# Patient Record
Sex: Male | Born: 1935 | Race: White | Hispanic: No | Marital: Married | Smoking: Never smoker
Health system: Southern US, Community
[De-identification: ages and names within clinical notes are randomized; demographics above are authoritative.]

## PROBLEM LIST (undated history)

## (undated) DIAGNOSIS — E785 Hyperlipidemia, unspecified: Secondary | ICD-10-CM

## (undated) DIAGNOSIS — I441 Atrioventricular block, second degree: Secondary | ICD-10-CM

## (undated) DIAGNOSIS — C439 Malignant melanoma of skin, unspecified: Secondary | ICD-10-CM

## (undated) DIAGNOSIS — I639 Cerebral infarction, unspecified: Secondary | ICD-10-CM

## (undated) DIAGNOSIS — I4891 Unspecified atrial fibrillation: Secondary | ICD-10-CM

## (undated) DIAGNOSIS — IMO0002 Reserved for concepts with insufficient information to code with codable children: Secondary | ICD-10-CM

## (undated) DIAGNOSIS — I509 Heart failure, unspecified: Secondary | ICD-10-CM

## (undated) DIAGNOSIS — I10 Essential (primary) hypertension: Secondary | ICD-10-CM

## (undated) DIAGNOSIS — N186 End stage renal disease: Secondary | ICD-10-CM

## (undated) DIAGNOSIS — E119 Type 2 diabetes mellitus without complications: Secondary | ICD-10-CM

## (undated) DIAGNOSIS — D649 Anemia, unspecified: Secondary | ICD-10-CM

## (undated) DIAGNOSIS — Z992 Dependence on renal dialysis: Secondary | ICD-10-CM

## (undated) HISTORY — PX: COLONOSCOPY: SHX174

## (undated) HISTORY — DX: Reserved for concepts with insufficient information to code with codable children: IMO0002

## (undated) HISTORY — DX: Cerebral infarction, unspecified: I63.9

## (undated) HISTORY — DX: Essential (primary) hypertension: I10

## (undated) HISTORY — DX: Malignant melanoma of skin, unspecified: C43.9

## (undated) HISTORY — DX: Unspecified atrial fibrillation: I48.91

## (undated) HISTORY — PX: CATARACT EXTRACTION W/ INTRAOCULAR LENS  IMPLANT, BILATERAL: SHX1307

## (undated) HISTORY — DX: Type 2 diabetes mellitus without complications: E11.9

## (undated) HISTORY — PX: LAMINECTOMY: SHX219

## (undated) HISTORY — DX: Heart failure, unspecified: I50.9

## (undated) HISTORY — DX: Atrioventricular block, second degree: I44.1

## (undated) HISTORY — DX: Hyperlipidemia, unspecified: E78.5

---

## 1998-04-06 ENCOUNTER — Other Ambulatory Visit: Admission: RE | Admit: 1998-04-06 | Discharge: 1998-04-06 | Payer: Self-pay | Admitting: Urology

## 1998-11-07 ENCOUNTER — Other Ambulatory Visit: Admission: RE | Admit: 1998-11-07 | Discharge: 1998-11-07 | Payer: Self-pay | Admitting: Family Medicine

## 1998-12-19 ENCOUNTER — Ambulatory Visit (HOSPITAL_COMMUNITY): Admission: RE | Admit: 1998-12-19 | Discharge: 1998-12-19 | Payer: Self-pay | Admitting: Gastroenterology

## 1999-01-22 ENCOUNTER — Encounter: Payer: Self-pay | Admitting: Family Medicine

## 1999-01-22 ENCOUNTER — Ambulatory Visit (HOSPITAL_COMMUNITY): Admission: RE | Admit: 1999-01-22 | Discharge: 1999-01-22 | Payer: Self-pay | Admitting: Family Medicine

## 2002-05-21 ENCOUNTER — Ambulatory Visit (HOSPITAL_COMMUNITY): Admission: RE | Admit: 2002-05-21 | Discharge: 2002-05-21 | Payer: Self-pay | Admitting: Gastroenterology

## 2002-05-21 ENCOUNTER — Encounter (INDEPENDENT_AMBULATORY_CARE_PROVIDER_SITE_OTHER): Payer: Self-pay | Admitting: Specialist

## 2003-07-20 ENCOUNTER — Ambulatory Visit (HOSPITAL_COMMUNITY): Admission: RE | Admit: 2003-07-20 | Discharge: 2003-07-21 | Payer: Self-pay | Admitting: Neurosurgery

## 2003-08-23 ENCOUNTER — Encounter: Admission: RE | Admit: 2003-08-23 | Discharge: 2003-08-23 | Payer: Self-pay | Admitting: Neurosurgery

## 2003-10-25 ENCOUNTER — Encounter: Admission: RE | Admit: 2003-10-25 | Discharge: 2003-10-25 | Payer: Self-pay | Admitting: Neurosurgery

## 2006-01-10 ENCOUNTER — Encounter: Admission: RE | Admit: 2006-01-10 | Discharge: 2006-02-28 | Payer: Self-pay | Admitting: Obstetrics and Gynecology

## 2008-09-16 DIAGNOSIS — C439 Malignant melanoma of skin, unspecified: Secondary | ICD-10-CM

## 2008-09-16 HISTORY — DX: Malignant melanoma of skin, unspecified: C43.9

## 2009-11-03 ENCOUNTER — Emergency Department (HOSPITAL_BASED_OUTPATIENT_CLINIC_OR_DEPARTMENT_OTHER): Admission: EM | Admit: 2009-11-03 | Discharge: 2009-11-03 | Payer: Self-pay | Admitting: Emergency Medicine

## 2010-12-26 ENCOUNTER — Emergency Department (INDEPENDENT_AMBULATORY_CARE_PROVIDER_SITE_OTHER): Payer: MEDICARE

## 2010-12-26 ENCOUNTER — Emergency Department (HOSPITAL_BASED_OUTPATIENT_CLINIC_OR_DEPARTMENT_OTHER)
Admission: EM | Admit: 2010-12-26 | Discharge: 2010-12-26 | Disposition: A | Discharge status: Home / Self Care | Payer: MEDICARE | Attending: Emergency Medicine | Admitting: Emergency Medicine

## 2010-12-26 DIAGNOSIS — M549 Dorsalgia, unspecified: Secondary | ICD-10-CM

## 2010-12-26 DIAGNOSIS — M545 Low back pain, unspecified: Secondary | ICD-10-CM | POA: Insufficient documentation

## 2010-12-26 DIAGNOSIS — E119 Type 2 diabetes mellitus without complications: Secondary | ICD-10-CM | POA: Insufficient documentation

## 2010-12-26 DIAGNOSIS — W010XXA Fall on same level from slipping, tripping and stumbling without subsequent striking against object, initial encounter: Secondary | ICD-10-CM

## 2010-12-26 DIAGNOSIS — Z79899 Other long term (current) drug therapy: Secondary | ICD-10-CM | POA: Insufficient documentation

## 2010-12-26 DIAGNOSIS — I10 Essential (primary) hypertension: Secondary | ICD-10-CM | POA: Insufficient documentation

## 2010-12-26 DIAGNOSIS — Y92009 Unspecified place in unspecified non-institutional (private) residence as the place of occurrence of the external cause: Secondary | ICD-10-CM | POA: Insufficient documentation

## 2010-12-26 DIAGNOSIS — E7800 Pure hypercholesterolemia, unspecified: Secondary | ICD-10-CM | POA: Insufficient documentation

## 2010-12-26 DIAGNOSIS — W19XXXA Unspecified fall, initial encounter: Secondary | ICD-10-CM | POA: Insufficient documentation

## 2010-12-26 DIAGNOSIS — M25559 Pain in unspecified hip: Secondary | ICD-10-CM

## 2011-02-01 NOTE — Op Note (Signed)
   James David, James David                         ACCOUNT NO.:  [id number]   MEDICAL RECORD NO.:  [id number]                   PATIENT TYPE:  AMB   LOCATION:  ENDO                                 FACILITY:  [facility]   PHYSICIAN:  Petra Kuba, M.D.                 DATE OF BIRTH:  December 14, 1935   DATE OF PROCEDURE:  DATE OF DISCHARGE:                                 OPERATIVE REPORT   PROCEDURE:  Colonoscopy with polypectomy.   INDICATIONS FOR PROCEDURE:  A patient with a history of colon polyps due for  repeat screening. Consent was signed after risks, benefits, methods, and  options were thoroughly discussed in the office on multiple occasions.   MEDICINES USED:  Demerol 80, Versed 8.   DESCRIPTION OF PROCEDURE:  Rectal inspection was pertinent for external  hemorrhoids. Digital exam was negative. The video colonoscope was inserted  and with some difficulty due to a long looping colon, we were able to  advance to the cecum which required rolling him first on his back and then  on his right side. Various abdominal pressures were tried. There was no  obvious abnormality seen on insertion. The cecum was identified by the  appendiceal orifice and the ileocecal valve. The scope was slowly withdrawn.  On slow withdrawal through the colon, the prep was adequate. There was some  liquid stool that required washing and suctioning. Other than a questionable  tiny proximal descending polyp which was hot biopsied, no other  abnormalities were seen as we slowly withdrew back to the rectum. Once back  in the rectum, the scope was retroflexed pertinent for internal hemorrhoids.  The scope was straightened and readvanced a short ways up the left side of  the colon, air was suctioned, scope removed. The patient tolerated the  procedure adequately, there was no obvious or immediate complication.   ENDOSCOPIC DIAGNOSIS:  1. Internal and external hemorrhoids.  2. Long looping tortuous colon.  3. One tiny  proximal descending polyp hot biopsied.  4. Otherwise within normal limits to the cecum.   PLAN:  Await pathology but probably repeat colon screening in five years.  Happy to see back p.r.n., otherwise, return care to Dr. Lenise Arena for the  customary health care maintenance to include yearly rectals and guaiacs.                                               Petra Kuba, M.D.    MEM/MEDQ  D:  05/21/2002  T:  05/21/2002  Job:  [id number]   cc:   Alanson Aly. Lenise Arena, M.D.

## 2011-02-01 NOTE — Op Note (Signed)
NAME:  James David, James David                          ACCOUNT NO.:  [id number]   MEDICAL RECORD NO.:  [id number]                   PATIENT TYPE:  OIB   LOCATION:  2899                                 FACILITY:  [facility]   PHYSICIAN:  Donalee Citrin, M.D.                     DATE OF BIRTH:  [birth year]   DATE OF PROCEDURE:  07/20/2003  DATE OF DISCHARGE:                                 OPERATIVE REPORT   PREOPERATIVE DIAGNOSIS:  Cervical spondylosis and myelopathy at C5-6 and  large ruptured disk at C5-6 causing severe spinal cord compression and edema  of the spinal cord.   POSTOPERATIVE DIAGNOSIS:  Cervical spondylosis and myelopathy at C5-6 and  large ruptured disk at C5-6 causing severe spinal cord compression and edema  of the spinal cord.   OPERATION PERFORMED:  Anterior cervical diskectomy and fusion at C5-6 using  an 8 mm patellar wedge and a 25 mm Atlantis plate, four 15 mm variable angle  screws.   SURGEON:  Donalee Citrin, M.D.   ASSISTANT:  Izell [place]. Elesa Hacker, M.D.   ANESTHESIA:  General endotracheal.   INDICATIONS FOR PROCEDURE:  The patient is a very pleasant 75 year old  gentleman who for the last several weeks has had progressive neck pain with  radiation into both arms, primarily on the right, with numbness and tingling  in all of his fingertips as well as the toes.  This has gotten progressively  worse and he has noticed weakness in his hands, weakness in his arms.  Preoperative imaging showed a very large ruptured disk at C5-6 compressing  the spinal cord with increased signal in T2 weighted MRI consistent with  spinal cord edema and severe spinal stenosis.  The patient was recommended  anterior cervical diskectomy and fusion due to his myelopathy as well as  large ruptured disk on MRI.  The risks and benefits were explained to the  patient and he understands and agreed to proceed forward.   DESCRIPTION OF PROCEDURE:  The patient was brought to the operating room,  placed  supine, neck in slight extension, five pounds halter traction.  The  right side was then prepped and draped in the usual sterile fashion.  Preoperative x-ray localized the C5-6 disk space.  A curvilinear incision  was made just off the midline and taken to the sternocleidomastoid muscle  and superficial platysma was dissected out and divided longitudinally at the  avascular plane between the sternocleidomastoid muscle and the strap muscles  was developed down to prevertebral fascia.  Prevertebral fascia was  dissected with Kitners.  Intraoperative x-ray confirmed localization of C5-6  disk space.  Annulotomy was made with a #15 blade scalpel.  Pituitary  rongeurs were used to remove the anterior margin of the annulus and marked  disk space.  The longus colli was then reflected laterally and self-  retaining retractor was placed.  Then using  a high speed drill. the anterior  margin of the annulus, the nucleus pulposus as well as the  end plates were  drilled down to posterior osteophyte, posterior annulus.  Operating  microscope was draped and brought onto the field.  Under microscopic  illumination, the annulus was gently removed with an upgoing curette and  several large fragments of disk were immediately visible that had migrated  through a hole in the posterior longitudinal ligament and these were all  removed in a piecemeal fashion.  Then the proximal aspect of the left C6  neural foramen was identified and the thecal sac was visualized next to the  proximal aspect of the right C6 neural foramen.  Then using a black nerve  hook and a very gentle dissection, a large central and slightly leftward  ruptured disk was peeled off from the ventral dura.  This was noted to be  mildly adherent and there was noted to be some denudation of the outer split  thickness layer of the dura, although there was no spinal fluid leak and the  arachnoid was not visible either, there was still some of the  inner lining  of the dura intact.  I __________ the arachnoid from the spinal cord, the  spinal cord was visible and pulsating underneath it.  Blunt nerve hook then  was used to tease several fragments of disk from behind C5 and C6 vertebral  body. Then the right C6 neural foramen was widely decompressed and  several  fragments were removed from foramen as well as __________ flow of the right  C6 nerve root.  After both C6 neural foramina were widely patent and the  thecal sac was visualized, the spinal cord pulsating and no further  fragments of disk were appreciated, the wound was copiously irrigated.  Meticulous hemostasis was obtained.  An 8 mm patellar wedge was sized,  selected and after the end-plates were then scraped and prepared to receive  the bone graft.  The bone graft was inserted 1 to 2 mm deep to the anterior  vertebral body line.  25 mm Atlantis plate was sized selected and four 15 mm  variable angle screws were drilled, tapped, placed and all screws had  excellent purchase.  Set screws were tightened down.  Postoperative  fluoroscopy confirmed good position of plates, screws and bone grafts.  Then  again meticulous hemostasis was maintained and the wound was copiously  irrigated.  The platysma was reapproximated with 3-0 interrupted Vicryl.  The subcutaneous tissue closed 2-0 interrupted Vicryl and skin closed with  running 4-0 subcuticular.  Benzoin and Steri-Strips applied.  The patient  was then transferred to the recovery room in stable condition.                                               Donalee Citrin, M.D.    GC/MEDQ  D:  07/20/2003  T:  07/21/2003  Job:  [id number]

## 2013-09-02 ENCOUNTER — Ambulatory Visit (INDEPENDENT_AMBULATORY_CARE_PROVIDER_SITE_OTHER): Payer: Medicare Other | Admitting: Podiatry

## 2013-09-02 ENCOUNTER — Encounter: Payer: Self-pay | Admitting: Podiatry

## 2013-09-02 VITALS — BP 157/81 | HR 62 | Resp 16

## 2013-09-02 DIAGNOSIS — B351 Tinea unguium: Secondary | ICD-10-CM

## 2013-09-02 DIAGNOSIS — M79609 Pain in unspecified limb: Secondary | ICD-10-CM

## 2013-09-02 NOTE — Progress Notes (Signed)
Subjective:     Patient ID: James David, male   DOB: 1936/07/31, 77 y.o.   MRN: [id number]  HPI patient presents with painful nailbeds 1-5 of both feet that he cannot   Review of Systems     Objective:   Physical Exam Neurovascular status unchanged with painful nailbeds that are thick 1-5 both feet    Assessment:     Mycotic nail infection with pain 1-5 both feet    Plan:     Debridement painful nail bed 1-5 both feet with no bleeding noted

## 2013-09-02 NOTE — Patient Instructions (Signed)
Diabetes and Foot Care Diabetes may cause you to have problems because of poor blood supply (circulation) to your feet and legs. This may cause the skin on your feet to become thinner, break easier, and heal more slowly. Your skin may become dry, and the skin may peel and crack. You may also have nerve damage in your legs and feet causing decreased feeling in them. You may not notice minor injuries to your feet that could lead to infections or more serious problems. Taking care of your feet is one of the most important things you can do for yourself.  HOME CARE INSTRUCTIONS  Wear shoes at all times, even in the house. Do not go barefoot. Bare feet are easily injured.  Check your feet daily for blisters, cuts, and redness. If you cannot see the bottom of your feet, use a mirror or ask someone for help.  Wash your feet with warm water (do not use hot water) and mild soap. Then pat your feet and the areas between your toes until they are completely dry. Do not soak your feet as this can dry your skin.  Apply a moisturizing lotion or petroleum jelly (that does not contain alcohol and is unscented) to the skin on your feet and to dry, brittle toenails. Do not apply lotion between your toes.  Trim your toenails straight across. Do not dig under them or around the cuticle. File the edges of your nails with an emery board or nail file.  Do not cut corns or calluses or try to remove them with medicine.  Wear clean socks or stockings every day. Make sure they are not too tight. Do not wear knee-high stockings since they may decrease blood flow to your legs.  Wear shoes that fit properly and have enough cushioning. To break in new shoes, wear them for just a few hours a day. This prevents you from injuring your feet. Always look in your shoes before you put them on to be sure there are no objects inside.  Do not cross your legs. This may decrease the blood flow to your feet.  If you find a minor scrape,  cut, or break in the skin on your feet, keep it and the skin around it clean and dry. These areas may be cleansed with mild soap and water. Do not cleanse the area with peroxide, alcohol, or iodine.  When you remove an adhesive bandage, be sure not to damage the skin around it.  If you have a wound, look at it several times a day to make sure it is healing.  Do not use heating pads or hot water bottles. They may burn your skin. If you have lost feeling in your feet or legs, you may not know it is happening until it is too late.  Make sure your health care provider performs a complete foot exam at least annually or more often if you have foot problems. Report any cuts, sores, or bruises to your health care provider immediately. SEEK MEDICAL CARE IF:   You have an injury that is not healing.  You have cuts or breaks in the skin.  You have an ingrown nail.  You notice redness on your legs or feet.  You feel burning or tingling in your legs or feet.  You have pain or cramps in your legs and feet.  Your legs or feet are numb.  Your feet always feel cold. SEEK IMMEDIATE MEDICAL CARE IF:   There is increasing redness,   swelling, or pain in or around a wound.  There is a red line that goes up your leg.  Pus is coming from a wound.  You develop a fever or as directed by your health care provider.  You notice a bad smell coming from an ulcer or wound. Document Released: 08/30/2000 Document Revised: 05/05/2013 Document Reviewed: 02/09/2013 [employer] Patient Information 2014 [employer].  

## 2013-11-25 ENCOUNTER — Ambulatory Visit (INDEPENDENT_AMBULATORY_CARE_PROVIDER_SITE_OTHER): Payer: Medicare Other | Admitting: Podiatry

## 2013-11-25 ENCOUNTER — Encounter: Payer: Self-pay | Admitting: Podiatry

## 2013-11-25 DIAGNOSIS — M79609 Pain in unspecified limb: Secondary | ICD-10-CM

## 2013-11-25 DIAGNOSIS — B351 Tinea unguium: Secondary | ICD-10-CM

## 2013-11-25 NOTE — Patient Instructions (Signed)
Diabetes and Foot Care Diabetes may cause you to have problems because of poor blood supply (circulation) to your feet and legs. This may cause the skin on your feet to become thinner, break easier, and heal more slowly. Your skin may become dry, and the skin may peel and crack. You may also have nerve damage in your legs and feet causing decreased feeling in them. You may not notice minor injuries to your feet that could lead to infections or more serious problems. Taking care of your feet is one of the most important things you can do for yourself.  HOME CARE INSTRUCTIONS  Wear shoes at all times, even in the house. Do not go barefoot. Bare feet are easily injured.  Check your feet daily for blisters, cuts, and redness. If you cannot see the bottom of your feet, use a mirror or ask someone for help.  Wash your feet with warm water (do not use hot water) and mild soap. Then pat your feet and the areas between your toes until they are completely dry. Do not soak your feet as this can dry your skin.  Apply a moisturizing lotion or petroleum jelly (that does not contain alcohol and is unscented) to the skin on your feet and to dry, brittle toenails. Do not apply lotion between your toes.  Trim your toenails straight across. Do not dig under them or around the cuticle. File the edges of your nails with an emery board or nail file.  Do not cut corns or calluses or try to remove them with medicine.  Wear clean socks or stockings every day. Make sure they are not too tight. Do not wear knee-high stockings since they may decrease blood flow to your legs.  Wear shoes that fit properly and have enough cushioning. To break in new shoes, wear them for just a few hours a day. This prevents you from injuring your feet. Always look in your shoes before you put them on to be sure there are no objects inside.  Do not cross your legs. This may decrease the blood flow to your feet.  If you find a minor scrape,  cut, or break in the skin on your feet, keep it and the skin around it clean and dry. These areas may be cleansed with mild soap and water. Do not cleanse the area with peroxide, alcohol, or iodine.  When you remove an adhesive bandage, be sure not to damage the skin around it.  If you have a wound, look at it several times a day to make sure it is healing.  Do not use heating pads or hot water bottles. They may burn your skin. If you have lost feeling in your feet or legs, you may not know it is happening until it is too late.  Make sure your health care provider performs a complete foot exam at least annually or more often if you have foot problems. Report any cuts, sores, or bruises to your health care provider immediately. SEEK MEDICAL CARE IF:   You have an injury that is not healing.  You have cuts or breaks in the skin.  You have an ingrown nail.  You notice redness on your legs or feet.  You feel burning or tingling in your legs or feet.  You have pain or cramps in your legs and feet.  Your legs or feet are numb.  Your feet always feel cold. SEEK IMMEDIATE MEDICAL CARE IF:   There is increasing redness,   swelling, or pain in or around a wound.  There is a red line that goes up your leg.  Pus is coming from a wound.  You develop a fever or as directed by your health care provider.  You notice a bad smell coming from an ulcer or wound. Document Released: 08/30/2000 Document Revised: 05/05/2013 Document Reviewed: 02/09/2013 [employer] Patient Information 2014 [employer].  

## 2013-11-27 NOTE — Progress Notes (Signed)
Subjective:     Patient ID: James David, male   DOB: 30-Jun-1936, 78 y.o.   MRN: [id number]  HPI patient presents with painful thick toenails on both feet that are bothering him and he is unable to cut them   Review of Systems     Objective:   Physical Exam Neurovascular status unchanged with thick nailbeds and pain 1-5 both feet    Assessment:     Chronic mycotic nail infection with pain 1-5 both feet    Plan:     Debridement painful nailbeds 1-5 both feet with no bleeding noted

## 2013-12-13 ENCOUNTER — Encounter: Payer: Self-pay | Admitting: Interventional Cardiology

## 2013-12-13 ENCOUNTER — Ambulatory Visit (INDEPENDENT_AMBULATORY_CARE_PROVIDER_SITE_OTHER): Payer: Medicare Other | Admitting: Interventional Cardiology

## 2013-12-13 VITALS — BP 120/72 | HR 93 | Ht 70.0 in | Wt 199.0 lb

## 2013-12-13 DIAGNOSIS — E785 Hyperlipidemia, unspecified: Secondary | ICD-10-CM | POA: Insufficient documentation

## 2013-12-13 DIAGNOSIS — N184 Chronic kidney disease, stage 4 (severe): Secondary | ICD-10-CM | POA: Insufficient documentation

## 2013-12-13 DIAGNOSIS — E1329 Other specified diabetes mellitus with other diabetic kidney complication: Secondary | ICD-10-CM

## 2013-12-13 DIAGNOSIS — I10 Essential (primary) hypertension: Secondary | ICD-10-CM

## 2013-12-13 DIAGNOSIS — E0821 Diabetes mellitus due to underlying condition with diabetic nephropathy: Secondary | ICD-10-CM | POA: Insufficient documentation

## 2013-12-13 DIAGNOSIS — I441 Atrioventricular block, second degree: Secondary | ICD-10-CM

## 2013-12-13 DIAGNOSIS — N058 Unspecified nephritic syndrome with other morphologic changes: Secondary | ICD-10-CM

## 2013-12-13 DIAGNOSIS — I4430 Unspecified atrioventricular block: Secondary | ICD-10-CM

## 2013-12-13 MED ORDER — CARVEDILOL 25 MG PO TABS
12.5000 mg | ORAL_TABLET | Freq: Every day | ORAL | Status: DC
Start: 2013-12-13 — End: 2013-12-27

## 2013-12-13 NOTE — Progress Notes (Signed)
Patient ID: James David, male   DOB: 05-Mar-1936, 78 y.o.   MRN: [id number]   Date: 12/13/2013 ID: James David, DOB 11/08/1935, MRN [id number] PCP:  Melinda Crutch, MD  Reason: Heart block  ASSESSMENT;  1. Second-degree heart block, unknown duration, aggravated by concomitant beta blocker therapy 2. Hypertension 3. Stage IV chronic kidney disease 4. Diabetes mellitus, complicated by nephropathy  PLAN:  1. Wean and discontinue carvedilol by decreasing the dose to a total of 12.5 mg per day for one week then discontinue 2. I've advised the patient not to take lisinopril again until it is okayed by Dr. Justin Mend whom he will see tomorrow. I worry about the possibility of worsening renal function and hyperkalemia in this patient with AV block 3. Use other medications such as amlodipine, hydralazine, etc. to treat hypertension 4. Will ultimately need a Holter monitor and ischemic evaluation once he is off beta blocker therapy and we determine if AV block was medication related or intrinsic.  Overall the patient is clinically stable, perhaps having episodes of hypoglycemia, likely has underlying AV conduction system disease but it is difficult to tell as he is still on beta blocker therapy. The etiology of A-V conduction abnormality may be myocardial ischemia or degenerative. We have decided to wean and discontinue beta blocker therapy. I have decided to decrease and/or DC lisinopril at least until Dr. Justin Mend has an opportunity to decide if the increased dose initiated in February by Dr. Harrington Challenger is appropriate considering a creatinine of 2.88 and potassium of 5.4 at the time. He hyperkalemia in the setting of A-V block will aggravate the situation. This office visit lasted 60 minutes   SUBJECTIVE: James David is a 78 y.o. male who is referred because of "first degree AV block". He saw Dr. Harrington Challenger one month ago and was found to be bradycardic with heart rates in the 40s. Carvedilol dose was decreased from 25  mg twice a day to 12.5 mg twice a day. Other than exertional fatigue the patient has been asymptomatic. Exertional fatigue is not significantly different now than it was prior to seeing Dr. Harrington Challenger. He states that fatigue has been present for greater than a year. He has not fainted. He denies chest pain and dyspnea. He has long-standing diabetes.   Allergies  Allergen Reactions  . Statins     Current Outpatient Prescriptions on File Prior to Visit  Medication Sig Dispense Refill  . aspirin 81 MG tablet Take 81 mg by mouth daily.      Marland Kitchen ezetimibe (ZETIA) 10 MG tablet Take 10 mg by mouth daily.      . fenofibrate 160 MG tablet Take 160 mg by mouth daily.      . finasteride (PROSCAR) 5 MG tablet Take 5 mg by mouth daily.      . Fish Oil OIL by Does not apply route.      Marland Kitchen glimepiride (AMARYL) 4 MG tablet Take 4 mg by mouth daily with breakfast.      . ipratropium (ATROVENT) 0.03 % nasal spray Place 2 sprays into both nostrils every 12 (twelve) hours.      Marland Kitchen lisinopril (PRINIVIL,ZESTRIL) 20 MG tablet Take 20 mg by mouth daily.      . Multiple Vitamin (MULTIVITAMIN) capsule Take 1 capsule by mouth daily.      . Tamsulosin HCl (FLOMAX PO) Take by mouth.       No current facility-administered medications on file prior to visit.    Past Medical  History  Diagnosis Date  . Diabetes mellitus without complication   . Hypertension   . Hyperlipidemia     History reviewed. No pertinent past surgical history.  History   Social History  . Marital Status: Single    Spouse Name: N/A    Number of Children: N/A  . Years of Education: N/A   Occupational History  . Not on file.   Social History Main Topics  . Smoking status: Unknown If Ever Smoked  . Smokeless tobacco: Not on file  . Alcohol Use: No  . Drug Use: Not on file  . Sexual Activity: Not on file   Other Topics Concern  . Not on file   Social History Narrative  . No narrative on file    Family History  Problem Relation Age  of Onset  . Pulmonary embolism Mother   . Heart attack Father     ROS: Denies syncope. Does have an episode this past weekend where he could not get out of his recliner. His wife states that he appeared groggy. He did not lose consciousness. They did not measure his blood sugar. He denies orthopnea. No chest pain. Lower sternum the swelling has been occurring now for about 6 months. He denies cough, hemoptysis, and syncope.. Other systems negative for complaints.  OBJECTIVE: BP 120/72  Pulse 93  Ht 5\' 10"  (1.778 m)  Wt 199 lb (90.266 kg)  BMI 28.55 kg/m2,  General: No acute distress, older than stated age 40: normal without jaundice or pallor Neck: JVD elevated with an occasional cannon A waves. Carotids no bruit Chest: Clear Cardiac: Murmur: 2 of 6 systolic murmur right upper sternal border. No aortic regurgitation. Gallop: Absent. Rhythm: Irregular. Other: Normal Abdomen: Bruit: Absent. Pulsation: Absent Extremities: Edema: 2+ bilateral. Pulses: 1+ bilateral with irregularity Neuro: Normal Psych: Normal  ECG: Mobitz 1 second-degree heart block

## 2013-12-13 NOTE — Patient Instructions (Addendum)
Your physician has recommended you make the following change in your medication:  1) REDUCE Carvedilol to 1/2 tablet 12.5mg  daily for 1 week then stop  Take all other medication as prescribed  You have a follow up appt scheduled for 01/06/14 @8 :45 am

## 2013-12-27 ENCOUNTER — Telehealth: Payer: Self-pay | Admitting: Cardiology

## 2013-12-27 MED ORDER — AMLODIPINE BESYLATE 10 MG PO TABS: 10.0000 mg | ORAL_TABLET | Freq: Every day | ORAL | Status: DC

## 2013-12-27 NOTE — Telephone Encounter (Signed)
Pt was is the office with his wife for her appt. Pt told Dr. Tamala Julian that he had been taken off of coreg because of complete hearblock and also taken off lisinopril because of renal insufficiency. Dr. Tamala Julian requests that we start pt on Amlodipine 10 mg qd because BP today was 210/100. Pt will start medication today and he has an appt next week to follow up. Pt aware and rx sent in.

## 2014-01-06 ENCOUNTER — Ambulatory Visit: Payer: Medicare Other | Admitting: Interventional Cardiology

## 2014-01-10 ENCOUNTER — Encounter: Payer: Self-pay | Admitting: Interventional Cardiology

## 2014-01-10 ENCOUNTER — Ambulatory Visit (INDEPENDENT_AMBULATORY_CARE_PROVIDER_SITE_OTHER): Payer: Medicare Other | Admitting: Interventional Cardiology

## 2014-01-10 VITALS — BP 160/78 | HR 69 | Ht 70.0 in | Wt 204.0 lb

## 2014-01-10 DIAGNOSIS — I4430 Unspecified atrioventricular block: Secondary | ICD-10-CM

## 2014-01-10 DIAGNOSIS — I10 Essential (primary) hypertension: Secondary | ICD-10-CM

## 2014-01-10 DIAGNOSIS — I441 Atrioventricular block, second degree: Secondary | ICD-10-CM

## 2014-01-10 DIAGNOSIS — N184 Chronic kidney disease, stage 4 (severe): Secondary | ICD-10-CM

## 2014-01-10 LAB — BASIC METABOLIC PANEL
BUN: 38 mg/dL — AB (ref 6–23)
CHLORIDE: 107 meq/L (ref 96–112)
CO2: 24 mEq/L (ref 19–32)
Calcium: 9 mg/dL (ref 8.4–10.5)
Creatinine, Ser: 3.3 mg/dL — ABNORMAL HIGH (ref 0.4–1.5)
GFR: 19.55 mL/min — ABNORMAL LOW (ref >60.00)
GLUCOSE: 108 mg/dL — AB (ref 70–99)
POTASSIUM: 4.5 meq/L (ref 3.5–5.1)
Sodium: 138 mEq/L (ref 135–145)

## 2014-01-10 MED ORDER — FUROSEMIDE 40 MG PO TABS: 40.0000 mg | ORAL_TABLET | Freq: Every day | ORAL | Status: DC

## 2014-01-10 NOTE — Progress Notes (Signed)
Patient ID: James David, male   DOB: July 19, 1936, 78 y.o.   MRN: [id number]    [address] [place], [place]  [address] Phone: [phone] Fax:  [phone]  Date:  01/10/2014   ID:  James David, DOB [birth year], MRN [id number]  PCP:   Melinda Crutch, MD   ASSESSMENT: 1. Second-degree heart block, persistent despite discontinuation of beta blocker therapy greater than 7 days ago. 2. Exertional fatigue and dyspnea, slightly improved. 3. Systolic hypertension 4. Chronic kidney disease 5. Prior history of tachycardia, specific type is unknown  PLAN:  1. Add furosemide 40 mg per day to help with blood pressure control until we can use other medications.ACE and ARB therapy or problematic because of kidney failure. Beta blockers, clonidine, etc., will have impact on heart rate 2. 24-hour Holter monitor 3. Referral to EP for consideration of DDD pacemaker 4. Given his diabetes, he will need to have an ischemic evaluation at some point to exclude the possibility that ischemia has some role in AV conduction abnormality. There no anginal complaints at this time therefore I will off on this until we get the rhythm situation control.   SUBJECTIVE: James David is a 78 y.o. male who has long-standing diabetes and diabetic nephropathy. Has a history of hypertension. He was referred several weeks ago with bradycardia. He was in AV block at the initial evaluation. He feels some better now off beta blocker therapy although there is still exertional fatigue and mild dyspnea. He has gained 5 pounds since my initial visit 4 weeks ago. He denies orthopnea. There is no edema. He has not had syncope. He denies tachycardia palpitations   Wt Readings from Last 3 Encounters:  01/10/14 204 lb (92.534 kg)  12/13/13 199 lb (90.266 kg)     Past Medical History  Diagnosis Date  . Diabetes mellitus without complication   . Hypertension   . Hyperlipidemia     Current Outpatient Prescriptions    Medication Sig Dispense Refill  . ACCU-CHEK AVIVA PLUS test strip       . ACCU-CHEK SOFTCLIX LANCETS lancets       . amLODipine (NORVASC) 10 MG tablet Take 1 tablet (10 mg total) by mouth daily.  30 tablet  6  . aspirin 81 MG tablet Take 81 mg by mouth daily.      Marland Kitchen ezetimibe (ZETIA) 10 MG tablet Take 10 mg by mouth daily.      . fenofibrate 160 MG tablet Take 160 mg by mouth daily.      . finasteride (PROSCAR) 5 MG tablet Take 5 mg by mouth daily.      . Fish Oil OIL by Does not apply route.      . furosemide (LASIX) 40 MG tablet Take 1 tablet (40 mg total) by mouth daily.  30 tablet  11  . glimepiride (AMARYL) 4 MG tablet Take 4 mg by mouth daily with breakfast.      . ipratropium (ATROVENT) 0.03 % nasal spray Place 2 sprays into both nostrils every 12 (twelve) hours.      . Multiple Vitamin (MULTIVITAMIN) capsule Take 1 capsule by mouth daily.      . Tamsulosin HCl (FLOMAX PO) Take by mouth.       No current facility-administered medications for this visit.    Allergies:    Allergies  Allergen Reactions  . Coreg [Carvedilol]     Caused 2nd degree HB  . Lisinopril  Renal insufficiency  . Statins     Social History:  The patient  reports that he does not drink alcohol.   ROS:  Please see the history of present illness.   Trace edema. No transient neurological symptoms.   All other systems reviewed and negative.   OBJECTIVE: VS:  BP 160/78  Pulse 69  Ht 5\' 10"  (1.778 m)  Wt 204 lb (92.534 kg)  BMI 29.27 kg/m2 Well nourished, well developed, in no acute distress, appears stable. HEENT: normal Neck: JVD flat. Carotid bruit absent  Cardiac:  normal S1, S2; RRR; no murmur Lungs:  clear to auscultation bilaterally, no wheezing, rhonchi or rales Abd: soft, nontender, no hepatomegaly Ext: Edema absent. Pulses 2+ Skin: warm and dry Neuro:  CNs 2-12 intact, no focal abnormalities noted  EKG:  Second-degree AV block with atrial rate 125 beats per minute and ventricular rate  65 beats per minute       Signed, Illene Labrador III, MD 01/10/2014 9:10 AM

## 2014-01-10 NOTE — Patient Instructions (Signed)
Your physician has recommended you make the following change in your medication:  1) START Lasix 40mg  daily Take all other medications as prescribed  Lab Today: Bmet  Your physician has recommended that you wear a holter monitor. Holter monitors are medical devices that record the heart's electrical activity. Doctors most often use these monitors to diagnose arrhythmias. Arrhythmias are problems with the speed or rhythm of the heartbeat. The monitor is a small, portable device. You can wear one while you do your normal daily activities. This is usually used to diagnose what is causing palpitations/syncope (passing out).   Your physician recommends that you schedule a follow-up appointment in: 1-2 weeks with one of our EP Physicians

## 2014-01-11 ENCOUNTER — Encounter: Payer: Self-pay | Admitting: *Deleted

## 2014-01-11 ENCOUNTER — Encounter (INDEPENDENT_AMBULATORY_CARE_PROVIDER_SITE_OTHER): Payer: Medicare Other

## 2014-01-11 DIAGNOSIS — I4430 Unspecified atrioventricular block: Secondary | ICD-10-CM

## 2014-01-11 NOTE — Progress Notes (Signed)
Patient ID: James David, male   DOB: Jan 03, 1936, 78 y.o.   MRN: [id number] EVO 24 hour holter monitor applied to patient.

## 2014-01-13 ENCOUNTER — Telehealth: Payer: Self-pay

## 2014-01-13 NOTE — Telephone Encounter (Signed)
Message copied by Lamar Laundry on Thu Jan 13, 2014  9:05 AM ------      Message from: Daneen Schick      Created: Mon Jan 10, 2014  7:41 PM       Kidney function is worse. Copy to nephrology ------

## 2014-01-13 NOTE — Telephone Encounter (Signed)
Follow up     Returned call to Lattie Haw to get test results

## 2014-01-13 NOTE — Telephone Encounter (Signed)
called to give pt lab results lmom for pt to call back

## 2014-01-13 NOTE — Telephone Encounter (Signed)
returned pt call.pt aware of bmet results.Kidney function is worse. Copy to nephrology.pt sts that his kidney doctor is Dr.Webb will fwd a copy to him for f/u.pt verbalized understanding.

## 2014-01-14 ENCOUNTER — Telehealth: Payer: Self-pay | Admitting: Interventional Cardiology

## 2014-01-14 NOTE — Telephone Encounter (Signed)
Returned call to pt after obtaining prior bmp results. Discussed BUN & Creatinine with wife & she understands the worsening of kidney function with her husband.  She & he are aware from last ov with Dr. Justin Mend in March that he does have stage IV kidney disease & the possibility of dialysis in the future.  Pt has follow-up appt with Dr. Justin Mend in June. Reassurance given. All questions answered. Horton Chin RN

## 2014-01-14 NOTE — Telephone Encounter (Signed)
New message    Patient wife calling stating Lattie Haw called on yesterday stated  his Kidney function was worse .  Asking more understanding on how much worse his kidney function are.

## 2014-01-18 ENCOUNTER — Other Ambulatory Visit: Payer: Self-pay | Admitting: *Deleted

## 2014-01-18 DIAGNOSIS — R0602 Shortness of breath: Secondary | ICD-10-CM

## 2014-01-18 DIAGNOSIS — I441 Atrioventricular block, second degree: Secondary | ICD-10-CM

## 2014-01-19 ENCOUNTER — Encounter: Payer: Self-pay | Admitting: *Deleted

## 2014-01-19 ENCOUNTER — Other Ambulatory Visit (HOSPITAL_COMMUNITY): Payer: Medicare Other

## 2014-01-19 ENCOUNTER — Ambulatory Visit (INDEPENDENT_AMBULATORY_CARE_PROVIDER_SITE_OTHER): Payer: Medicare Other | Admitting: Internal Medicine

## 2014-01-19 ENCOUNTER — Encounter: Payer: Self-pay | Admitting: Internal Medicine

## 2014-01-19 ENCOUNTER — Encounter (HOSPITAL_COMMUNITY): Payer: Self-pay | Admitting: Pharmacy Technician

## 2014-01-19 ENCOUNTER — Ambulatory Visit (HOSPITAL_COMMUNITY)
Admission: RE | Admit: 2014-01-19 | Discharge: 2014-01-19 | Disposition: A | Discharge status: Home / Self Care | Payer: Medicare Other | Source: Ambulatory Visit | Attending: Cardiovascular Disease | Admitting: Cardiovascular Disease

## 2014-01-19 VITALS — BP 138/84 | HR 69 | Ht 70.0 in | Wt 200.0 lb

## 2014-01-19 DIAGNOSIS — E119 Type 2 diabetes mellitus without complications: Secondary | ICD-10-CM | POA: Insufficient documentation

## 2014-01-19 DIAGNOSIS — I129 Hypertensive chronic kidney disease with stage 1 through stage 4 chronic kidney disease, or unspecified chronic kidney disease: Secondary | ICD-10-CM | POA: Insufficient documentation

## 2014-01-19 DIAGNOSIS — I10 Essential (primary) hypertension: Secondary | ICD-10-CM

## 2014-01-19 DIAGNOSIS — E785 Hyperlipidemia, unspecified: Secondary | ICD-10-CM | POA: Insufficient documentation

## 2014-01-19 DIAGNOSIS — I441 Atrioventricular block, second degree: Secondary | ICD-10-CM

## 2014-01-19 DIAGNOSIS — N189 Chronic kidney disease, unspecified: Secondary | ICD-10-CM | POA: Insufficient documentation

## 2014-01-19 DIAGNOSIS — N184 Chronic kidney disease, stage 4 (severe): Secondary | ICD-10-CM

## 2014-01-19 DIAGNOSIS — I319 Disease of pericardium, unspecified: Secondary | ICD-10-CM

## 2014-01-19 LAB — BASIC METABOLIC PANEL WITH GFR
BUN: 50 mg/dL — ABNORMAL HIGH (ref 6–23)
CO2: 25 meq/L (ref 19–32)
Calcium: 9.2 mg/dL (ref 8.4–10.5)
Chloride: 105 meq/L (ref 96–112)
Creatinine, Ser: 4.1 mg/dL — ABNORMAL HIGH (ref 0.4–1.5)
GFR: 15 mL/min — CL
Glucose, Bld: 140 mg/dL — ABNORMAL HIGH (ref 70–99)
Potassium: 4.3 meq/L (ref 3.5–5.1)
Sodium: 138 meq/L (ref 135–145)

## 2014-01-19 LAB — CBC WITH DIFFERENTIAL/PLATELET
BASOS ABS: 0 10*3/uL (ref 0.0–0.1)
Basophils Relative: 0.4 % (ref 0.0–3.0)
EOS ABS: 0.2 10*3/uL (ref 0.0–0.7)
Eosinophils Relative: 1.7 % (ref 0.0–5.0)
HCT: 45.6 % (ref 39.0–52.0)
HEMOGLOBIN: 15.3 g/dL (ref 13.0–17.0)
LYMPHS PCT: 14.1 % (ref 12.0–46.0)
Lymphs Abs: 1.3 10*3/uL (ref 0.7–4.0)
MCHC: 33.4 g/dL (ref 30.0–36.0)
MCV: 92.3 fl (ref 78.0–100.0)
MONO ABS: 0.7 10*3/uL (ref 0.1–1.0)
Monocytes Relative: 7.4 % (ref 3.0–12.0)
NEUTROS ABS: 7.2 10*3/uL (ref 1.4–7.7)
NEUTROS PCT: 76.4 % (ref 43.0–77.0)
Platelets: 247 10*3/uL (ref 150.0–400.0)
RBC: 4.94 Mil/uL (ref 4.22–5.81)
RDW: 13.2 % (ref 11.5–15.5)
WBC: 9.4 10*3/uL (ref 4.0–10.5)

## 2014-01-19 MED ORDER — CEFAZOLIN SODIUM-DEXTROSE 2-3 GM-% IV SOLR
2.0000 g | INTRAVENOUS | Status: DC
  Filled 2014-01-19: qty 50

## 2014-01-19 MED ORDER — SODIUM CHLORIDE 0.9 % IR SOLN
80.0000 mg | Status: DC
  Filled 2014-01-19: qty 2

## 2014-01-19 NOTE — Progress Notes (Signed)
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 male with a h/o second degree AV block who presents today for EP consultation.He has had symptoms of fatigue and decreased exercise tolerance for several months.  He presented to Dr Harrington Challenger and was found to have significant bradycardia with heart rates 40s.  He was evaluated by Dr Tamala Julian and found to have 2:1 AV block.  He has been taken off of beta blockers with persistence of his symptoms and also of his AV block.  He reports that he is frequently tired and has difficulty walking because he feels "give out".  He has occasional dizziness but denies presyncope or syncope.  He has stage IV renal impairment for which he is followed by Dr Justin Mend.  He denies SOB at rest but reports dypsnea with mild to moderate activity.  He has had bilateral edema for several months.  Today, he denies symptoms of palpitations, chest pain, orthopnea, PND, or neurologic sequela. The patient is tolerating medications without difficulties and is otherwise without complaint today.   Past Medical History  Diagnosis Date  . Diabetes mellitus without complication   . Hypertension   . Hyperlipidemia   . Second degree heart block   . Chronic kidney disease, stage IV (severe)     baseline creatinine 2-3  . DDD (degenerative disc disease)   . Melanoma 2010    removed at [facility]   Past Surgical History  Procedure Laterality Date  . Laminectomy      Current Outpatient Prescriptions  Medication Sig Dispense Refill  . amLODipine (NORVASC) 10 MG tablet Take 1 tablet (10 mg total) by mouth daily.  30 tablet  6  . aspirin 81 MG tablet Take 81 mg by mouth daily.      . Coenzyme Q10 (CO Q-10) 100 MG CAPS Take 1 capsule by mouth daily.      Marland Kitchen ezetimibe (ZETIA) 10 MG tablet Take 10 mg by mouth daily.      . fenofibrate 160 MG tablet Take 160 mg by mouth daily.      . finasteride (PROSCAR) 5 MG tablet Take 5 mg by mouth daily.      .  furosemide (LASIX) 40 MG tablet Take 1 tablet (40 mg total) by mouth daily.  30 tablet  11  . glimepiride (AMARYL) 4 MG tablet Take 4 mg by mouth daily with breakfast.      . ipratropium (ATROVENT) 0.03 % nasal spray Place 2 sprays into both nostrils every 12 (twelve) hours.      . Omega-3 Fatty Acids (FISH OIL PO) Take 1 capsule by mouth daily.      . Tamsulosin HCl (FLOMAX PO) Take 1 capsule three times a week       No current facility-administered medications for this visit.    Allergies  Allergen Reactions  . Coreg [Carvedilol]     Caused 2nd degree HB  . Lisinopril     Renal insufficiency  . Statins     History   Social History  . Marital Status: Single    Spouse Name: N/A    Number of Children: N/A  . Years of Education: N/A   Occupational History  . Not on file.   Social History Main Topics  . Smoking status: Never Smoker   . Smokeless tobacco: Not on file  . Alcohol Use: No  . Drug Use: No  . Sexual Activity: Not on file  Other Topics Concern  . Not on file   Social History Narrative   Lives in [place] with spouse.  Retired Theatre manager for [employer].    Family History  Problem Relation Age of Onset  . Pulmonary embolism Mother   . Heart attack Father     ROS- All systems are reviewed and negative except as per the HPI above  Physical Exam: Filed Vitals:   01/19/14 0857  BP: 138/84  Pulse: 69  Height: 5\' 10"  (1.778 m)  Weight: 200 lb (90.719 kg)    GEN- The patient is well appearing, alert and oriented x 3 today.   Head- normocephalic, atraumatic Eyes-  Sclera clear, conjunctiva pink Ears- hearing intact Oropharynx- clear Neck- supple, no JVP Lymph- no cervical lymphadenopathy Lungs- Clear to ausculation bilaterally, normal work of breathing Heart- bradycardic regular rhythm, rubs or gallops, PMI not laterally displaced GI- soft, NT, ND, + BS Extremities- no clubbing, cyanosis, or edema MS- no significant deformity or  atrophy Skin- no rash or lesion Psych- euthymic mood, full affect Neuro- strength and sensation are intact  EKG today reveals mobitz I second degree AV block, V rate 69 bpm, lateral TWI holter monitor 4/15 is reviewed and reveals frequent second degree AV block.  Both mobitz I and mobitz II are demonstrated, no prolonged pauses, but daytime heart rates in the 40s are documented Echo is discussed with Dr Aundra Dubin this am Epic records including Dr Darliss Ridgel notes are reviewed Labs from primary care 10/27/13 [facility] [place]) are obtained today and reviewed.  TSH is 1.55.  Creatinine 2.8  Assessment and Plan:   1. Second degree AV block The patient has symptomatic bradycardia with fatigue and decreased exercise tolerance.  His record reveals both mobitz I and II second degree AV block. I am concerned that he has advanced conduction system disease.  No reversible causes are noted and symptoms persist despite holding his AV nodal agents.  I would therefore recommend pacemaker implantation at this time.  Risks, benefits, alternatives to pacemaker implantation were discussed in detail with the patient today. The patient understands that the risks include but are not limited to bleeding, infection, pneumothorax, perforation, tamponade, vascular damage, renal failure, MI, stroke, death,  and lead dislodgement and wishes to proceed. We will therefore schedule the procedure at the next available time.  2. htn Stable No change required today  3. CRF Baseline creatinine 2-3. No changes today

## 2014-01-19 NOTE — Progress Notes (Signed)
2D Echo Performed 01/19/2014    Marygrace Drought, RCS

## 2014-01-19 NOTE — Patient Instructions (Signed)
Your physician has recommended that you have a pacemaker inserted. A pacemaker is a small device that is placed under the skin of your chest or abdomen to help control abnormal heart rhythms. This device uses electrical pulses to prompt the heart to beat at a normal rate. Pacemakers are used to treat heart rhythms that are too slow. Wire (leads) are attached to the pacemaker that goes into the chambers of you heart. This is done in the hospital and usually requires and overnight stay. Please see the instruction sheet given to you today for more information.  See instruction sheet 

## 2014-01-20 ENCOUNTER — Ambulatory Visit (HOSPITAL_COMMUNITY)
Admission: RE | Admit: 2014-01-20 | Discharge: 2014-01-21 | Disposition: A | Discharge status: Home / Self Care | Payer: Medicare Other | Source: Ambulatory Visit | Attending: Internal Medicine | Admitting: Internal Medicine

## 2014-01-20 ENCOUNTER — Encounter (HOSPITAL_COMMUNITY)
Admission: RE | Disposition: A | Discharge status: Home / Self Care | Payer: Self-pay | Source: Ambulatory Visit | Attending: Internal Medicine

## 2014-01-20 DIAGNOSIS — I319 Disease of pericardium, unspecified: Secondary | ICD-10-CM | POA: Insufficient documentation

## 2014-01-20 DIAGNOSIS — I129 Hypertensive chronic kidney disease with stage 1 through stage 4 chronic kidney disease, or unspecified chronic kidney disease: Secondary | ICD-10-CM | POA: Insufficient documentation

## 2014-01-20 DIAGNOSIS — I441 Atrioventricular block, second degree: Secondary | ICD-10-CM

## 2014-01-20 DIAGNOSIS — N184 Chronic kidney disease, stage 4 (severe): Secondary | ICD-10-CM | POA: Insufficient documentation

## 2014-01-20 DIAGNOSIS — I4892 Unspecified atrial flutter: Secondary | ICD-10-CM | POA: Insufficient documentation

## 2014-01-20 DIAGNOSIS — Z7982 Long term (current) use of aspirin: Secondary | ICD-10-CM | POA: Insufficient documentation

## 2014-01-20 DIAGNOSIS — IMO0002 Reserved for concepts with insufficient information to code with codable children: Secondary | ICD-10-CM | POA: Insufficient documentation

## 2014-01-20 DIAGNOSIS — Z85820 Personal history of malignant melanoma of skin: Secondary | ICD-10-CM | POA: Insufficient documentation

## 2014-01-20 DIAGNOSIS — R110 Nausea: Secondary | ICD-10-CM | POA: Insufficient documentation

## 2014-01-20 DIAGNOSIS — E785 Hyperlipidemia, unspecified: Secondary | ICD-10-CM | POA: Insufficient documentation

## 2014-01-20 DIAGNOSIS — E119 Type 2 diabetes mellitus without complications: Secondary | ICD-10-CM | POA: Insufficient documentation

## 2014-01-20 HISTORY — PX: PERMANENT PACEMAKER INSERTION: SHX5480

## 2014-01-20 LAB — GLUCOSE, CAPILLARY
GLUCOSE-CAPILLARY: 119 mg/dL — AB (ref 70–99)
Glucose-Capillary: 174 mg/dL — ABNORMAL HIGH (ref 70–99)

## 2014-01-20 LAB — SURGICAL PCR SCREEN
MRSA, PCR: NEGATIVE
Staphylococcus aureus: NEGATIVE

## 2014-01-20 SURGERY — PERMANENT PACEMAKER INSERTION
Anesthesia: LOCAL

## 2014-01-20 MED ORDER — ACETAMINOPHEN 325 MG PO TABS: 325.0000 mg | ORAL_TABLET | ORAL | Status: DC | PRN

## 2014-01-20 MED ORDER — IPRATROPIUM BROMIDE 0.03 % NA SOLN
2.0000 | Freq: Three times a day (TID) | NASAL | Status: DC
  Filled 2014-01-20: qty 30

## 2014-01-20 MED ORDER — FINASTERIDE 5 MG PO TABS
5.0000 mg | ORAL_TABLET | Freq: Every day | ORAL | Status: DC
  Administered 2014-01-21: 5 mg via ORAL
  Filled 2014-01-20: qty 1

## 2014-01-20 MED ORDER — SODIUM CHLORIDE 0.9 % IV SOLN
INTRAVENOUS | Status: DC
  Administered 2014-01-20: 14:00:00 via INTRAVENOUS

## 2014-01-20 MED ORDER — LIDOCAINE HCL (PF) 1 % IJ SOLN
INTRAMUSCULAR | Status: AC
  Filled 2014-01-20: qty 30

## 2014-01-20 MED ORDER — HEPARIN (PORCINE) IN NACL 2-0.9 UNIT/ML-% IJ SOLN
INTRAMUSCULAR | Status: AC
  Filled 2014-01-20: qty 500

## 2014-01-20 MED ORDER — GLIMEPIRIDE 4 MG PO TABS
4.0000 mg | ORAL_TABLET | Freq: Every day | ORAL | Status: DC
  Administered 2014-01-21: 4 mg via ORAL
  Filled 2014-01-20 (×3): qty 1

## 2014-01-20 MED ORDER — HYDROCODONE-ACETAMINOPHEN 5-325 MG PO TABS: 1.0000 | ORAL_TABLET | ORAL | Status: DC | PRN

## 2014-01-20 MED ORDER — MUPIROCIN 2 % EX OINT
TOPICAL_OINTMENT | Freq: Two times a day (BID) | CUTANEOUS | Status: DC
  Administered 2014-01-20: 1 via NASAL
  Filled 2014-01-20 (×2): qty 22

## 2014-01-20 MED ORDER — AMLODIPINE BESYLATE 10 MG PO TABS
10.0000 mg | ORAL_TABLET | Freq: Every day | ORAL | Status: DC
  Administered 2014-01-21: 10 mg via ORAL
  Filled 2014-01-20: qty 1

## 2014-01-20 MED ORDER — ASPIRIN EC 81 MG PO TBEC
81.0000 mg | DELAYED_RELEASE_TABLET | Freq: Every day | ORAL | Status: DC
  Administered 2014-01-20: 81 mg via ORAL
  Filled 2014-01-20 (×2): qty 1

## 2014-01-20 MED ORDER — CEFAZOLIN SODIUM 1-5 GM-% IV SOLN
1.0000 g | Freq: Four times a day (QID) | INTRAVENOUS | Status: AC
  Administered 2014-01-20 – 2014-01-21 (×3): 1 g via INTRAVENOUS
  Filled 2014-01-20 (×3): qty 50

## 2014-01-20 MED ORDER — SODIUM CHLORIDE 0.9 % IJ SOLN
3.0000 mL | Freq: Two times a day (BID) | INTRAMUSCULAR | Status: DC
  Administered 2014-01-21: 3 mL via INTRAVENOUS

## 2014-01-20 MED ORDER — ONDANSETRON HCL 4 MG/2ML IJ SOLN: 4.0000 mg | Freq: Four times a day (QID) | INTRAMUSCULAR | Status: DC | PRN

## 2014-01-20 MED ORDER — IPRATROPIUM BROMIDE 0.06 % NA SOLN
1.0000 | Freq: Three times a day (TID) | NASAL | Status: DC
  Filled 2014-01-20: qty 15

## 2014-01-20 MED ORDER — FUROSEMIDE 40 MG PO TABS
40.0000 mg | ORAL_TABLET | Freq: Every day | ORAL | Status: DC
  Administered 2014-01-21: 40 mg via ORAL
  Filled 2014-01-20 (×2): qty 1

## 2014-01-20 MED ORDER — SODIUM CHLORIDE 0.9 % IJ SOLN: 3.0000 mL | INTRAMUSCULAR | Status: DC | PRN

## 2014-01-20 MED ORDER — SODIUM CHLORIDE 0.9 % IV SOLN: 250.0000 mL | INTRAVENOUS | Status: DC | PRN

## 2014-01-20 MED ORDER — TAMSULOSIN HCL 0.4 MG PO CAPS
0.4000 mg | ORAL_CAPSULE | ORAL | Status: DC
  Administered 2014-01-21: 0.4 mg via ORAL
  Filled 2014-01-20: qty 1

## 2014-01-20 NOTE — H&P (View-Only) (Signed)
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 male with a h/o second degree AV block who presents today for EP consultation.He has had symptoms of fatigue and decreased exercise tolerance for several months.  He presented to Dr Harrington Challenger and was found to have significant bradycardia with heart rates 40s.  He was evaluated by Dr Tamala Julian and found to have 2:1 AV block.  He has been taken off of beta blockers with persistence of his symptoms and also of his AV block.  He reports that he is frequently tired and has difficulty walking because he feels "give out".  He has occasional dizziness but denies presyncope or syncope.  He has stage IV renal impairment for which he is followed by Dr Justin Mend.  He denies SOB at rest but reports dypsnea with mild to moderate activity.  He has had bilateral edema for several months.  Today, he denies symptoms of palpitations, chest pain, orthopnea, PND, or neurologic sequela. The patient is tolerating medications without difficulties and is otherwise without complaint today.   Past Medical History  Diagnosis Date  . Diabetes mellitus without complication   . Hypertension   . Hyperlipidemia   . Second degree heart block   . Chronic kidney disease, stage IV (severe)     baseline creatinine 2-3  . DDD (degenerative disc disease)   . Melanoma 2010    removed at [facility]   Past Surgical History  Procedure Laterality Date  . Laminectomy      Current Outpatient Prescriptions  Medication Sig Dispense Refill  . amLODipine (NORVASC) 10 MG tablet Take 1 tablet (10 mg total) by mouth daily.  30 tablet  6  . aspirin 81 MG tablet Take 81 mg by mouth daily.      . Coenzyme Q10 (CO Q-10) 100 MG CAPS Take 1 capsule by mouth daily.      Marland Kitchen ezetimibe (ZETIA) 10 MG tablet Take 10 mg by mouth daily.      . fenofibrate 160 MG tablet Take 160 mg by mouth daily.      . finasteride (PROSCAR) 5 MG tablet Take 5 mg by mouth daily.      .  furosemide (LASIX) 40 MG tablet Take 1 tablet (40 mg total) by mouth daily.  30 tablet  11  . glimepiride (AMARYL) 4 MG tablet Take 4 mg by mouth daily with breakfast.      . ipratropium (ATROVENT) 0.03 % nasal spray Place 2 sprays into both nostrils every 12 (twelve) hours.      . Omega-3 Fatty Acids (FISH OIL PO) Take 1 capsule by mouth daily.      . Tamsulosin HCl (FLOMAX PO) Take 1 capsule three times a week       No current facility-administered medications for this visit.    Allergies  Allergen Reactions  . Coreg [Carvedilol]     Caused 2nd degree HB  . Lisinopril     Renal insufficiency  . Statins     History   Social History  . Marital Status: Single    Spouse Name: N/A    Number of Children: N/A  . Years of Education: N/A   Occupational History  . Not on file.   Social History Main Topics  . Smoking status: Never Smoker   . Smokeless tobacco: Not on file  . Alcohol Use: No  . Drug Use: No  . Sexual Activity: Not on file  Other Topics Concern  . Not on file   Social History Narrative   Lives in [place] with spouse.  Retired Theatre manager for [employer].    Family History  Problem Relation Age of Onset  . Pulmonary embolism Mother   . Heart attack Father     ROS- All systems are reviewed and negative except as per the HPI above  Physical Exam: Filed Vitals:   01/19/14 0857  BP: 138/84  Pulse: 69  Height: 5\' 10"  (1.778 m)  Weight: 200 lb (90.719 kg)    GEN- The patient is well appearing, alert and oriented x 3 today.   Head- normocephalic, atraumatic Eyes-  Sclera clear, conjunctiva pink Ears- hearing intact Oropharynx- clear Neck- supple, no JVP Lymph- no cervical lymphadenopathy Lungs- Clear to ausculation bilaterally, normal work of breathing Heart- bradycardic regular rhythm, rubs or gallops, PMI not laterally displaced GI- soft, NT, ND, + BS Extremities- no clubbing, cyanosis, or edema MS- no significant deformity or  atrophy Skin- no rash or lesion Psych- euthymic mood, full affect Neuro- strength and sensation are intact  EKG today reveals mobitz I second degree AV block, V rate 69 bpm, lateral TWI holter monitor 4/15 is reviewed and reveals frequent second degree AV block.  Both mobitz I and mobitz II are demonstrated, no prolonged pauses, but daytime heart rates in the 40s are documented Echo is discussed with Dr Aundra Dubin this am Epic records including Dr Darliss Ridgel notes are reviewed Labs from primary care 10/27/13 [facility] [place]) are obtained today and reviewed.  TSH is 1.55.  Creatinine 2.8  Assessment and Plan:   1. Second degree AV block The patient has symptomatic bradycardia with fatigue and decreased exercise tolerance.  His record reveals both mobitz I and II second degree AV block. I am concerned that he has advanced conduction system disease.  No reversible causes are noted and symptoms persist despite holding his AV nodal agents.  I would therefore recommend pacemaker implantation at this time.  Risks, benefits, alternatives to pacemaker implantation were discussed in detail with the patient today. The patient understands that the risks include but are not limited to bleeding, infection, pneumothorax, perforation, tamponade, vascular damage, renal failure, MI, stroke, death,  and lead dislodgement and wishes to proceed. We will therefore schedule the procedure at the next available time.  2. htn Stable No change required today  3. CRF Baseline creatinine 2-3. No changes today

## 2014-01-20 NOTE — Progress Notes (Signed)
Orthopedic Tech Progress Note Patient Details:  James David 08-Jul-1936 [id number]  Ortho Devices Type of Ortho Device: Arm sling Ortho Device/Splint Location: LUE Ortho Device/Splint Interventions: Ordered;Application   Braulio Bosch 01/20/2014, 10:31 PM

## 2014-01-20 NOTE — Op Note (Addendum)
SURGEON:  Thompson Grayer, MD     PREPROCEDURE DIAGNOSIS:  Symptomatic second degree AV block    POSTPROCEDURE DIAGNOSIS:  Symptomatic second degree AV block, atrial flutter     PROCEDURES:   1. Pacemaker implantation.     INTRODUCTION:  James David is a 78 y.o. male with a history of symptomatic second degree AV block who presents today for pacemaker implantation.  The patient reports intermittent episodes of dizziness, fatigue and SOB over the past few months.  He has been documented to have both mobitz I and mobitz II second degree AV block with V rates at times in the 40s.  This corresponds to worsening symptoms.  No reversible causes have been identified.  He also has worsening renal failure.  Recent echo revealed small pericardial effusion.  The patient therefore presents today for pacemaker implantation.     DESCRIPTION OF PROCEDURE:  Informed written consent was obtained, and  the patient was brought to the electrophysiology lab in a fasting state.  The patient required no sedation for the procedure today.  The patients left chest was prepped and draped in the usual sterile fashion by the EP lab staff. The skin overlying the left deltopectoral region was infiltrated with lidocaine for local analgesia.  A 4-cm incision was made over the left deltopectoral region.  A left subcutaneous pacemaker pocket was fashioned using a combination of sharp and blunt dissection. Electrocautery was required to assure hemostasis.     RA/RV Lead Placement: The left axillary vein was therefore cannulated. No contrast was required for the procedure today.  Through the left axillary vein, a [employer] model [id number] (serial number  [id number]) right atrial lead and a [facility] model [phone] (serial number  [id number]) right ventricular lead were advanced with fluoroscopic visualization into the right atrial appendage and right ventricular apex positions respectively.  He presented today in atrial flutter  with an atrial rate of 150 bpm.  Initial atrial lead flutter waves measured 2.1 mV with impedance of 498 ohms and a threshold of 2 V at 0.5 msec. Extensive mapping was required to find this best atrial position.   Right ventricular lead R-waves measured 12.2 mV with an impedance of 909 ohms and a threshold of 0.5 V at 0.5 msec.  Both leads were secured to the pectoralis fascia using #2-0 silk over the suture sleeves.   Device Placement:  The leads were then connected to a [place] DR  model [id number] (serial number  [id number]) pacemaker.  The pocket was irrigated with copious gentamicin solution.  The pacemaker was then placed into the pocket.  The pocket was then closed in 2 layers with 2.0 Vicryl suture for the subcutaneous and subcuticular layers.  Steri-  Strips and a sterile dressing were then applied.  There were no early apparent complications.  No contrast was given for this procedure today.    CONCLUSIONS:   1. Successful implantation of a [employer] DR  dual-chamber pacemaker for symptomatic second degree AV block  2. Atrial flutter is a new finding.  I will discuss possible anticoagulation with Dr Tamala Julian.  Given renal failure, he is not presently a candidate for novel anticoagulation  3. No early apparent complications.           Thompson Grayer, MD 01/20/2014 6:18 PM

## 2014-01-20 NOTE — Interval H&P Note (Signed)
History and Physical Interval Note:  01/20/2014 2:46 PM  [name]  has presented today for surgery, with the diagnosis of hb  The various methods of treatment have been discussed with the patient and family. After consideration of risks, benefits and other options for treatment, the patient has consented to  Procedure(s): PERMANENT PACEMAKER INSERTION (N/A) as a surgical intervention .  The patient's history has been reviewed, patient examined, no change in status, stable for surgery.  I have reviewed the patient's chart and labs.  Questions were answered to the patient's satisfaction.     James David

## 2014-01-21 ENCOUNTER — Ambulatory Visit (HOSPITAL_COMMUNITY): Payer: Medicare Other

## 2014-01-21 ENCOUNTER — Encounter (HOSPITAL_COMMUNITY): Payer: Self-pay | Admitting: *Deleted

## 2014-01-21 DIAGNOSIS — I441 Atrioventricular block, second degree: Secondary | ICD-10-CM

## 2014-01-21 LAB — BASIC METABOLIC PANEL
BUN: 43 mg/dL — AB (ref 6–23)
CO2: 20 mEq/L (ref 19–32)
CREATININE: 3.37 mg/dL — AB (ref 0.50–1.35)
Calcium: 8.7 mg/dL (ref 8.4–10.5)
Chloride: 107 mEq/L (ref 96–112)
GFR calc Af Amer: 19 mL/min — ABNORMAL LOW (ref >90)
GFR calc non Af Amer: 16 mL/min — ABNORMAL LOW (ref >90)
Glucose, Bld: 121 mg/dL — ABNORMAL HIGH (ref 70–99)
Potassium: 4.4 mEq/L (ref 3.7–5.3)
Sodium: 141 mEq/L (ref 137–147)

## 2014-01-21 LAB — GLUCOSE, CAPILLARY: Glucose-Capillary: 140 mg/dL — ABNORMAL HIGH (ref 70–99)

## 2014-01-21 MED ORDER — CARVEDILOL 12.5 MG PO TABS
12.5000 mg | ORAL_TABLET | Freq: Two times a day (BID) | ORAL | Status: DC
  Filled 2014-01-21 (×3): qty 1

## 2014-01-21 MED ORDER — CARVEDILOL 6.25 MG PO TABS: 6.2500 mg | ORAL_TABLET | Freq: Two times a day (BID) | ORAL | Status: DC

## 2014-01-21 NOTE — Progress Notes (Signed)
Noted atrial tachycardia off beta blocker therapy. Was previously on Carvedilol 25 mg BID. Plan to resume but at a lower initial dose and titrate up as OP as needed.  Nauseated with no obvious cause. Will give Zofran.

## 2014-01-21 NOTE — Progress Notes (Signed)
Patient given discharge instructions.  Calm, stable and cooperative.  Understood pacemaker restrictions.  Discharged to home with wife.

## 2014-01-21 NOTE — Discharge Instructions (Signed)
° ° °  Supplemental Discharge Instructions for  Pacemaker/Defibrillator Patients  Activity No heavy lifting or vigorous activity with your left/right arm for 6 to 8 weeks.  Do not raise your left/right arm above your head for one week.  Gradually raise your affected arm as drawn below.              5/10                      5/11                              5/12                    5/13 __  NO DRIVING for  1 week   ; you may begin driving on   2/95/28  .  WOUND CARE   Keep the wound area clean and dry.  Do not get this area wet for one week. No showers for one week; you may shower on     .   The tape/steri-strips on your wound will fall off; do not pull them off.  No bandage is needed on the site.  DO  NOT apply any creams, oils, or ointments to the wound area.   If you notice any drainage or discharge from the wound, any swelling or bruising at the site, or you develop a fever > 101? F after you are discharged home, call the office at once.  Special Instructions   You are still able to use cellular telephones; use the ear opposite the side where you have your pacemaker/defibrillator.  Avoid carrying your cellular phone near your device.   When traveling through airports, show security personnel your identification card to avoid being screened in the metal detectors.  Ask the security personnel to use the hand wand.   Avoid arc welding equipment, MRI testing (magnetic resonance imaging), TENS units (transcutaneous nerve stimulators).  Call the office for questions about other devices.   Avoid electrical appliances that are in poor condition or are not properly grounded.   Microwave ovens are safe to be near or to operate.  Additional information for defibrillator patients should your device go off:   If your device goes off ONCE and you feel fine afterward, notify the device clinic nurses.   If your device goes off ONCE and you do not feel well afterward, call 911.   If your device goes off  TWICE, call 911.   If your device goes off THREE times in one day, call 911.  DO NOT DRIVE YOURSELF OR A FAMILY MEMBER WITH A DEFIBRILLATOR TO THE HOSPITAL--CALL 911.

## 2014-01-21 NOTE — Discharge Summary (Signed)
ELECTROPHYSIOLOGY PROCEDURE DISCHARGE SUMMARY    Patient ID: James David,  MRN: [id number], DOB/AGE: January 11, 1936 78 y.o.  Admit date: 01/20/2014 Discharge date: 01/21/2014  Primary Care Physician:  Melinda Crutch, MD Primary Cardiologist: Tamala Julian Electrophysiologist: Uva Runkel  Primary Discharge Diagnosis:  2nd degree AV block status post pacemaker implantation this admission  Secondary Discharge Diagnosis:  1.  Diabetes 2.  Hypertension 3.  Hyperlipidemia 4.  CKD - stage IV - followed by Dr Edrick Oh 5.  DDD  Allergies  Allergen Reactions  . Coreg [Carvedilol] Other (See Comments)    Caused 2nd degree HB  . Lisinopril Other (See Comments)    Renal insufficiency  . Statins Other (See Comments)    Muscle aches     Procedures This Admission:  1.  Implantation of STJ dual chamber pacemaker on 01-20-2014 by Dr [name].  The patient received a STJ Assurity pacemaker with model number 2088 right atrial and 1948 right ventricular leads.  There were no early apparent complications. 2.  CXR on 01-21-2014 demonstrated stable lead placement and no ptx following device implantation.   Brief HPI: James David is a 78 y.o. male with a history of symptomatic second degree AV block who was referred to EP in the outpatient setting for consideration of pacemaker implantation. The patient reports intermittent episodes of dizziness, fatigue and SOB over the past few months. He has been documented to have both mobitz I and mobitz II second degree AV block with V rates at times in the 40s. This corresponds to worsening symptoms. No reversible causes have been identified. He also has worsening renal failure. Recent echo revealed small pericardial effusion. Risks, benefits, and alternatives to pacemaker implantation were reviewed with the patient who wished to proceed.   Hospital Course:  The patient was admitted and underwent implantation of a STJ dual chamber pacemaker with details as outlined above.    He was monitored on telemetry overnight which demonstrated sinus rhythm with intermittent ventricular pacing, occasional atrial tach with upper rate behavior.  Left chest was without hematoma or ecchymosis.  The device was interrogated and found to be functioning normally.  CXR was obtained and demonstrated no pneumothorax status post device implantation.  Wound care, arm mobility, and restrictions were reviewed with the patient.  Dr [name] examined the patient and considered them stable for discharge to home.   His renal function had worsened on pre-procedure labs.  Dr Edrick Oh was made aware and his office will contact the patient to follow up within 1 week.  The patient has been advised to call Dr Jason Nest office if he doesn't hear from them. Lasix was held in the hospital and his creatinine did improve.  He did not receive any contrast during his pacemaker procedure.   Discharge Vitals: Blood pressure 162/95, pulse 117, temperature 98.2 F (36.8 C), temperature source Oral, resp. rate 18, weight 192 lb 11.2 oz (87.408 kg), SpO2 97.00%.    Physical Exam: Filed Vitals:   01/20/14 2100 01/20/14 2200 01/21/14 0003 01/21/14 0553  BP: 172/68 179/71 146/75 162/95  Pulse: 75 86 75 117  Temp:   98.1 F (36.7 C) 98.2 F (36.8 C)  TempSrc:   Oral Oral  Resp:   18 18  Weight:    192 lb 11.2 oz (87.408 kg)  SpO2:   95% 97%    GEN- The patient is well appearing, alert and oriented x 3 today.   Head- normocephalic, atraumatic Eyes-  Sclera clear, conjunctiva pink  Ears- hearing intact Oropharynx- clear Neck- supple,  Lungs- Clear to ausculation bilaterally, normal work of breathing Heart- Regular rate and rhythm,  GI- soft, NT, ND, + BS Extremities- no clubbing, cyanosis, or edema Pacemaker pocket is without hematoma Neuro- strength and sensation are intact   Labs:   Lab Results  Component Value Date   WBC 9.4 01/19/2014   HGB 15.3 01/19/2014   HCT 45.6 01/19/2014   MCV 92.3 01/19/2014   PLT  247.0 01/19/2014     Recent Labs Lab 01/19/14 1036  NA 138  K 4.3  CL 105  CO2 25  BUN 50*  CREATININE 4.1*  CALCIUM 9.2  GLUCOSE 140*     Discharge Medications:    Medication List    ASK your doctor about these medications       acetaminophen 500 MG tablet  Commonly known as:  TYLENOL  Take 500 mg by mouth 2 (two) times daily as needed (pain).     amLODipine 10 MG tablet  Commonly known as:  NORVASC  Take 1 tablet (10 mg total) by mouth daily.     aspirin EC 81 MG tablet  Take 81 mg by mouth at bedtime.     Co Q-10 100 MG Caps  Take 100 mg by mouth daily.     ezetimibe 10 MG tablet  Commonly known as:  ZETIA  Take 10 mg by mouth daily.     fenofibrate 160 MG tablet  Take 160 mg by mouth daily.     finasteride 5 MG tablet  Commonly known as:  PROSCAR  Take 5 mg by mouth daily.     furosemide 40 MG tablet  Commonly known as:  LASIX  Take 1 tablet (40 mg total) by mouth daily.     glimepiride 4 MG tablet  Commonly known as:  AMARYL  Take 4 mg by mouth daily with breakfast.     ipratropium 0.03 % nasal spray  Commonly known as:  ATROVENT  Place 2 sprays into both nostrils 3 (three) times daily.     KRILL OIL OMEGA-3 PO  Take 1 capsule by mouth at bedtime.     tamsulosin 0.4 MG Caps capsule  Commonly known as:  FLOMAX  Take 0.4 mg by mouth 3 (three) times a week. Monday, Wednesday and Friday (at bedtime)        Disposition:   Future Appointments Provider Department Dept Phone   01/31/2014 9:00 AM [facility] [place] [facility] [phone]   02/24/2014 1:45 PM Wallene Huh, [place] [facility] [place]   03/24/2014 10:00 AM Sinclair Grooms, MD [facility] [phone]   05/05/2014 10:15 AM Thompson Grayer, MD [place] [facility] [phone]       Duration of Discharge Encounter: Greater than 30 minutes including physician time.  Signed,   Thompson Grayer MD

## 2014-01-26 ENCOUNTER — Telehealth: Payer: Self-pay

## 2014-01-26 ENCOUNTER — Telehealth: Payer: Self-pay | Admitting: Cardiology

## 2014-01-26 NOTE — Telephone Encounter (Signed)
Message copied by Lamar Laundry on Wed Jan 26, 2014  2:14 PM ------      Message from: Daneen Schick      Created: Wed Jan 26, 2014  8:16 AM       Echo okay ------

## 2014-01-26 NOTE — Telephone Encounter (Signed)
Pt had a message to call about test results. I reviewed echo with him.   Kerin Ransom PA-C 01/26/2014 5:24 PM

## 2014-01-26 NOTE — Telephone Encounter (Signed)
Called to give pt echo results.lmom for pt to call back

## 2014-01-31 ENCOUNTER — Ambulatory Visit (INDEPENDENT_AMBULATORY_CARE_PROVIDER_SITE_OTHER): Payer: Medicare Other | Admitting: *Deleted

## 2014-01-31 DIAGNOSIS — I441 Atrioventricular block, second degree: Secondary | ICD-10-CM

## 2014-01-31 LAB — MDC_IDC_ENUM_SESS_TYPE_INCLINIC
Battery Remaining Longevity: 85.2 mo
Date Time Interrogation Session: 20150518130557
Implantable Pulse Generator Model: 2240
Implantable Pulse Generator Serial Number: 7617100
Lead Channel Impedance Value: 425 Ohm
Lead Channel Impedance Value: 750 Ohm
Lead Channel Pacing Threshold Amplitude: 0.75 V
Lead Channel Pacing Threshold Amplitude: 0.75 V
Lead Channel Pacing Threshold Pulse Width: 0.4 ms
Lead Channel Pacing Threshold Pulse Width: 0.4 ms
Lead Channel Pacing Threshold Pulse Width: 0.4 ms
Lead Channel Sensing Intrinsic Amplitude: 12 mV
Lead Channel Setting Pacing Amplitude: 3.5 V
Lead Channel Setting Pacing Amplitude: 3.5 V
Lead Channel Setting Pacing Pulse Width: 0.4 ms
Lead Channel Setting Sensing Sensitivity: 2 mV
MDC IDC MSMT BATTERY VOLTAGE: 3.05 V
MDC IDC MSMT LEADCHNL RA PACING THRESHOLD AMPLITUDE: 1 V
MDC IDC MSMT LEADCHNL RA PACING THRESHOLD AMPLITUDE: 1 V
MDC IDC MSMT LEADCHNL RA PACING THRESHOLD PULSEWIDTH: 0.4 ms
MDC IDC MSMT LEADCHNL RA SENSING INTR AMPL: 0.9 mV
MDC IDC STAT BRADY RA PERCENT PACED: 0.15 %
MDC IDC STAT BRADY RV PERCENT PACED: 97 %

## 2014-01-31 NOTE — Progress Notes (Signed)
Wound check appointment. Steri-strips removed. Wound without redness or edema. Incision edges approximated, wound well healed. Normal device function. Thresholds, sensing, and impedances consistent with implant measurements. Device programmed at 3.5V for extra safety margin until 3 month visit. Histogram distribution appropriate for patient and level of activity. 2.7% mode switch, 8.6% AT/AF---longest 45min, max A-rate 213bpm. 1 VHR---12sec w/ max V-rate at 151bpm---was implant threshold test. 2PMTs. Patient educated about wound care, arm mobility, lifting restrictions. ROV w/ Dr. [name] 05/05/14.

## 2014-02-04 ENCOUNTER — Encounter: Payer: Self-pay | Admitting: Internal Medicine

## 2014-02-24 ENCOUNTER — Ambulatory Visit (INDEPENDENT_AMBULATORY_CARE_PROVIDER_SITE_OTHER): Payer: Medicare Other | Admitting: Podiatry

## 2014-02-24 DIAGNOSIS — M79609 Pain in unspecified limb: Secondary | ICD-10-CM

## 2014-02-24 DIAGNOSIS — B351 Tinea unguium: Secondary | ICD-10-CM

## 2014-02-28 NOTE — Progress Notes (Signed)
Subjective:     Patient ID: James David, male   DOB: [birth year], 78 y.o.   MRN: [id number]  HPI patient presents with pain in his nails with thickness and inability to take care of himself   Review of Systems     Objective:   Physical Exam Neurovascular status unchanged with thick yellow brittle toenails 1-5 both feet that are painful upon palpation    Assessment:     Mycotic nail infection with pain 1-5 both feet    Plan:     Debris painful nailbeds 1-5 both feet with no iatrogenic bleeding noted

## 2014-03-10 ENCOUNTER — Other Ambulatory Visit (HOSPITAL_COMMUNITY): Payer: Self-pay | Admitting: Physician Assistant

## 2014-03-24 ENCOUNTER — Encounter: Payer: Self-pay | Admitting: Interventional Cardiology

## 2014-03-24 ENCOUNTER — Ambulatory Visit (INDEPENDENT_AMBULATORY_CARE_PROVIDER_SITE_OTHER): Payer: Medicare Other | Admitting: Interventional Cardiology

## 2014-03-24 VITALS — BP 122/82 | HR 77 | Ht 70.0 in | Wt 200.0 lb

## 2014-03-24 DIAGNOSIS — R609 Edema, unspecified: Secondary | ICD-10-CM

## 2014-03-24 DIAGNOSIS — I5032 Chronic diastolic (congestive) heart failure: Secondary | ICD-10-CM

## 2014-03-24 DIAGNOSIS — N184 Chronic kidney disease, stage 4 (severe): Secondary | ICD-10-CM

## 2014-03-24 DIAGNOSIS — I441 Atrioventricular block, second degree: Secondary | ICD-10-CM

## 2014-03-24 DIAGNOSIS — I10 Essential (primary) hypertension: Secondary | ICD-10-CM

## 2014-03-24 DIAGNOSIS — R600 Localized edema: Secondary | ICD-10-CM

## 2014-03-24 NOTE — Patient Instructions (Addendum)
Your physician has recommended you make the following change in your medication:  1) STOP Amlodipine  Take all other medications as prescribed  Your physician wants you to follow-up in: 6 months You will receive a reminder letter in the mail two months in advance. If you don't receive a letter, please call our office to schedule the follow-up appointment.

## 2014-03-24 NOTE — Progress Notes (Signed)
Patient ID: James David, male   DOB: [birth year], 78 y.o.   MRN: [id number]    [address], [address] [place], [place]  [address] Phone: [phone] Fax:  [phone]  Date:  03/24/2014   ID:  James David, DOB July 25, 1936, MRN [id number]  PCP:   Melinda Crutch, MD   ASSESSMENT:  1. High-grade AV block, now treated with DDD pacemaker with dramatic improvement in the patient's exertional tolerance and energy 2. Diastolic heart failure, acute on chronic, with acute component resolved with pacing 3. Lower extremity swelling, probably multifactorial but certainly likely a component related to amlodipine. Other contributing factors are via retention related to chronic kidney disease stage IV 4. Chronic kidney disease stage IV 5. Hypertension  PLAN:  1. Discontinue amlodipine 2.  Monitor blood pressure closely, and is systolic pressures persistently above 481 or diastolic above 90 mmHg, will need to make adjustments in her antihypertensive regimen. 3. Followup with me in 6 months 4. Will leave blood pressure management to nephrology   SUBJECTIVE: James David is a 78 y.o. male who is doing much better now that AV block his been treated with a DDD pacemaker. His great concern his lower extremity swelling. It persists in the left leg greater than the right despite recent increase in diuretic regimen by Dr. Justin Mend. The patient denies angina and dyspnea.   Wt Readings from Last 3 Encounters:  03/24/14 200 lb (90.719 kg)  01/21/14 192 lb 11.2 oz (87.408 kg)  01/21/14 192 lb 11.2 oz (87.408 kg)     Past Medical History  Diagnosis Date  . Diabetes mellitus without complication   . Hypertension   . Hyperlipidemia   . Second degree heart block   . Chronic kidney disease, stage IV (severe)     baseline creatinine 2-3  . DDD (degenerative disc disease)   . Melanoma 2010    removed at [facility]    Current Outpatient Prescriptions  Medication Sig Dispense Refill  . acetaminophen (TYLENOL)  500 MG tablet Take 500 mg by mouth 2 (two) times daily as needed (pain).      Marland Kitchen amLODipine (NORVASC) 10 MG tablet Take 1 tablet (10 mg total) by mouth daily.  30 tablet  6  . aspirin EC 81 MG tablet Take 81 mg by mouth at bedtime.      . carvedilol (COREG) 6.25 MG tablet Take 1 tablet (6.25 mg total) by mouth 2 (two) times daily with a meal.  30 tablet  1  . Coenzyme Q10 (CO Q-10) 100 MG CAPS Take 100 mg by mouth daily.       Marland Kitchen ezetimibe (ZETIA) 10 MG tablet Take 10 mg by mouth daily.      . fenofibrate 160 MG tablet Take 160 mg by mouth daily.      . finasteride (PROSCAR) 5 MG tablet Take 5 mg by mouth daily.      . furosemide (LASIX) 80 MG tablet Take 80 mg by mouth.      Marland Kitchen glimepiride (AMARYL) 4 MG tablet Take 4 mg by mouth daily with breakfast.      . ipratropium (ATROVENT) 0.03 % nasal spray Place 2 sprays into both nostrils 3 (three) times daily.       Marland Kitchen KRILL OIL OMEGA-3 PO Take 1 capsule by mouth at bedtime.      . tamsulosin (FLOMAX) 0.4 MG CAPS capsule Take 0.4 mg by mouth 3 (three) times a week. Monday, Wednesday and Friday (at bedtime)  No current facility-administered medications for this visit.    Allergies:    Allergies  Allergen Reactions  . Coreg [Carvedilol] Other (See Comments)    Caused 2nd degree HB  . Lisinopril Other (See Comments)    Renal insufficiency  . Statins Other (See Comments)    Muscle aches    Social History:  The patient  reports that he has never smoked. He does not have any smokeless tobacco history on file. He reports that he does not drink alcohol or use illicit drugs.   ROS:  Please see the history of present illness.   Appetite is been stable. He has not had syncope or tachycardia. He denies soreness in his legs or ankles.   All other systems reviewed and negative.   OBJECTIVE: VS:  BP 122/82  Pulse 77  Ht 5\' 10"  (1.778 m)  Wt 200 lb (90.719 kg)  BMI 28.70 kg/m2  SpO2 97% Well nourished, well developed, in no acute distress,  elderly HEENT: normal Neck: JVD flat. Carotid bruit absent  Cardiac:  normal S1, S2; RRR; no murmur. Pacer pocket is unremarkable. No fluctuance or evidence of wound dehiscence or infection. Lungs:  clear to auscultation bilaterally, no wheezing, rhonchi or rales Abd: soft, nontender, no hepatomegaly Ext: Edema left greater than right 3/2+ edema. Pulses 2+ and symmetric Skin: warm and dry Neuro:  CNs 2-12 intact, no focal abnormalities noted  EKG:  Not repeated       Signed, Illene Labrador III, MD 03/24/2014 9:52 AM

## 2014-03-25 ENCOUNTER — Encounter: Payer: Self-pay | Admitting: Vascular Surgery

## 2014-03-25 ENCOUNTER — Other Ambulatory Visit: Payer: Self-pay

## 2014-03-25 DIAGNOSIS — N184 Chronic kidney disease, stage 4 (severe): Secondary | ICD-10-CM

## 2014-03-25 DIAGNOSIS — Z01810 Encounter for preprocedural cardiovascular examination: Secondary | ICD-10-CM

## 2014-04-07 ENCOUNTER — Encounter: Payer: Self-pay | Admitting: Vascular Surgery

## 2014-04-08 ENCOUNTER — Encounter: Payer: Self-pay | Admitting: Vascular Surgery

## 2014-04-08 ENCOUNTER — Ambulatory Visit (HOSPITAL_COMMUNITY)
Admission: RE | Admit: 2014-04-08 | Discharge: 2014-04-08 | Disposition: A | Discharge status: Home / Self Care | Payer: Medicare Other | Source: Ambulatory Visit | Attending: Vascular Surgery | Admitting: Vascular Surgery

## 2014-04-08 ENCOUNTER — Ambulatory Visit (INDEPENDENT_AMBULATORY_CARE_PROVIDER_SITE_OTHER)
Admission: RE | Admit: 2014-04-08 | Discharge: 2014-04-08 | Disposition: A | Discharge status: Home / Self Care | Payer: Medicare Other | Source: Ambulatory Visit | Attending: Vascular Surgery | Admitting: Vascular Surgery

## 2014-04-08 ENCOUNTER — Ambulatory Visit (INDEPENDENT_AMBULATORY_CARE_PROVIDER_SITE_OTHER): Payer: Medicare Other | Admitting: Vascular Surgery

## 2014-04-08 VITALS — BP 141/62 | HR 74 | Ht 70.0 in | Wt 195.0 lb

## 2014-04-08 DIAGNOSIS — Z01810 Encounter for preprocedural cardiovascular examination: Secondary | ICD-10-CM | POA: Diagnosis not present

## 2014-04-08 DIAGNOSIS — N184 Chronic kidney disease, stage 4 (severe): Secondary | ICD-10-CM | POA: Diagnosis not present

## 2014-04-08 DIAGNOSIS — N186 End stage renal disease: Secondary | ICD-10-CM | POA: Insufficient documentation

## 2014-04-08 NOTE — Progress Notes (Signed)
Referred by:  Melinda Crutch, MD [place], [place] [address]  Reason for referral: New access  History of Present Illness  James David is a 78 y.o. (1936-07-26) male who presents for evaluation for permanent access.  The patient is right hand dominant.  The patient has not had previous access procedures.  Previous central venous cannulation procedures include: PPM.  The patient has had a L SCV PPM placed.   Past Medical History  Diagnosis Date  . Diabetes mellitus without complication   . Hypertension   . Hyperlipidemia   . Second degree heart block   . Chronic kidney disease, stage IV (severe)     baseline creatinine 2-3  . DDD (degenerative disc disease)   . Melanoma 2010    removed at [facility]  . Atrial fibrillation     Past Surgical History  Procedure Laterality Date  . Laminectomy    . Pacemaker insertion  01-20-2014    STJ Assurity dual chamber pacemaker implanted by Dr [name] for 2nd degree AV block    History   Social History  . Marital Status: Single    Spouse Name: N/A    Number of Children: N/A  . Years of Education: N/A   Occupational History  . Not on file.   Social History Main Topics  . Smoking status: Never Smoker   . Smokeless tobacco: Not on file  . Alcohol Use: No  . Drug Use: No  . Sexual Activity: Not on file   Other Topics Concern  . Not on file   Social History Narrative   Lives in [place] with spouse.  Retired Theatre manager for [employer].    Family History  Problem Relation Age of Onset  . Pulmonary embolism Mother   . Diabetes Mother   . Heart attack Father   . Diabetes Brother     Current Outpatient Prescriptions on File Prior to Visit  Medication Sig Dispense Refill  . acetaminophen (TYLENOL) 500 MG tablet Take 500 mg by mouth 2 (two) times daily as needed (pain).      Marland Kitchen aspirin EC 81 MG tablet Take 81 mg by mouth at bedtime.      . carvedilol (COREG) 6.25 MG tablet Take 1 tablet (6.25 mg total) by mouth 2  (two) times daily with a meal.  30 tablet  1  . Coenzyme Q10 (CO Q-10) 100 MG CAPS Take 100 mg by mouth daily.       Marland Kitchen ezetimibe (ZETIA) 10 MG tablet Take 10 mg by mouth daily.      . fenofibrate 160 MG tablet Take 160 mg by mouth daily.      . finasteride (PROSCAR) 5 MG tablet Take 5 mg by mouth daily.      . furosemide (LASIX) 80 MG tablet Take 80 mg by mouth.      Marland Kitchen glimepiride (AMARYL) 4 MG tablet Take 4 mg by mouth daily with breakfast.      . ipratropium (ATROVENT) 0.03 % nasal spray Place 2 sprays into both nostrils 3 (three) times daily.       Marland Kitchen KRILL OIL OMEGA-3 PO Take 1 capsule by mouth at bedtime.      . tamsulosin (FLOMAX) 0.4 MG CAPS capsule Take 0.4 mg by mouth 3 (three) times a week. Monday, Wednesday and Friday (at bedtime)       No current facility-administered medications on file prior to visit.    Allergies  Allergen Reactions  . Coreg [  Carvedilol] Other (See Comments)    Caused 2nd degree HB  . Lisinopril Other (See Comments)    Renal insufficiency  . Statins Other (See Comments)    Muscle aches     REVIEW OF SYSTEMS:  (Positives checked otherwise negative)  CARDIOVASCULAR:  []  chest pain, []  chest pressure, []  palpitations, []  shortness of breath when laying flat, []  shortness of breath with exertion,  []  pain in feet when walking, []  pain in feet when laying flat, []  history of blood clot in veins (DVT), []  history of phlebitis, []  swelling in legs, []  varicose veins  PULMONARY:  []  productive cough, []  asthma, []  wheezing  NEUROLOGIC:  []  weakness in arms or legs, []  numbness in arms or legs, []  difficulty speaking or slurred speech, []  temporary loss of vision in one eye, []  dizziness  HEMATOLOGIC:  []  bleeding problems, []  problems with blood clotting too easily  MUSCULOSKEL:  []  joint pain, []  joint swelling  GASTROINTEST:  []  vomiting blood, []  blood in stool     GENITOURINARY:  []  burning with urination, []  blood in urine  PSYCHIATRIC:  []  history  of major depression  INTEGUMENTARY:  []  rashes, []  ulcers  CONSTITUTIONAL:  []  fever, []  chills  Physical Examination  Filed Vitals:   04/08/14 1055  BP: 141/62  Pulse: 74  Height: 5\' 10"  (1.778 m)  Weight: 195 lb (88.451 kg)  SpO2: 100%   Body mass index is 27.98 kg/(m^2).  General: A&O x 3, WDWN  Head: /AT  Ear/Nose/Throat: Hearing grossly intact, nares w/o erythema or drainage, oropharynx w/o Erythema/Exudate, Mallampati score: 3  Eyes: PERRLA, EOMI  Neck: Supple, no nuchal rigidity, no palpable LAD  Pulmonary: Sym exp, good air movt, CTAB, no rales, rhonchi, & wheezing  Cardiac: RRR, Nl S1, S2, no Murmurs, rubs or gallops  Vascular: Vessel Right Left  Radial Palpable Palpable  Ulnar Faintly Palpable Faintly Palpable  Brachial Palpable Palpable  Carotid Palpable, without bruit Palpable, without bruit  Aorta Not palpable N/A  Femoral Palpable Palpable  Popliteal Not palpable Not palpable  PT Faintly Palpable Faintly Palpable  DP Faintly Palpable Faintly Palpable   Gastrointestinal: soft, NTND, -G/R, - HSM, - masses, - CVAT B  Musculoskeletal: M/S 5/5 throughout , Extremities without ischemic changes   Neurologic: CN 2-12 intact , Pain and light touch intact in extremities , Motor exam as listed above  Psychiatric: Judgment intact, Mood & affect appropriate for pt's clinical situation  Dermatologic: See M/S exam for extremity exam, no rashes otherwise noted  Lymph : No Cervical, Axillary, or Inguinal lymphadenopathy   Non-Invasive Vascular Imaging  Vein Mapping  (Date: 04/08/2014):   R arm: acceptable vein conduits include entire cephalic and upper arm basilic vein  L arm: acceptable vein conduits include entire cephalic and upper arm basilic vein  BUE Doppler (Date: 04/08/2014):   R arm:   Brachial: tri, 5.2 mm  Radial: tri, 2.6 mm  Ulnar: tri, 2.6 mm  L arm:   Brachial: tri, 4.0 mm  Radial: tri, 2.6 mm  Ulnar: tri, 3.0 mm  Outside  Studies/Documentation 5 pages of outside documents were reviewed including: outpatient nephrology chart.  Medical Decision Making  James David is a 78 y.o. male who presents with chronic kidney disease stage IV  Based on vein mapping and examination, this patient's permanent access options include: R RC vs BC AVF, staged R BVT  I had an extensive discussion with this patient in regards to the nature of access  surgery, including risk, benefits, and alternatives.    The patient is aware that the risks of access surgery include but are not limited to: bleeding, infection, steal syndrome, nerve damage, ischemic monomelic neuropathy, failure of access to mature, and possible need for additional access procedures in the future. I discussed with the patient the nature of the staged access procedure, specifically the need for a second operation to transpose the first stage fistula if it matures adequately.    The patient has not agreed to proceed with the above procedure.  He is going back to Dr. Justin Mend next month and will discuss the procedure further with him.  Adele Barthel, MD [facility] Office: [phone] Pager: [phone]  04/08/2014, 2:20 PM

## 2014-04-11 ENCOUNTER — Other Ambulatory Visit (HOSPITAL_COMMUNITY): Payer: Self-pay | Admitting: Interventional Cardiology

## 2014-05-05 ENCOUNTER — Ambulatory Visit (INDEPENDENT_AMBULATORY_CARE_PROVIDER_SITE_OTHER): Payer: Medicare Other | Admitting: Internal Medicine

## 2014-05-05 ENCOUNTER — Encounter: Payer: Self-pay | Admitting: Internal Medicine

## 2014-05-05 VITALS — BP 120/80 | HR 74 | Ht 70.0 in | Wt 197.8 lb

## 2014-05-05 DIAGNOSIS — Z950 Presence of cardiac pacemaker: Secondary | ICD-10-CM

## 2014-05-05 DIAGNOSIS — I10 Essential (primary) hypertension: Secondary | ICD-10-CM

## 2014-05-05 DIAGNOSIS — I441 Atrioventricular block, second degree: Secondary | ICD-10-CM

## 2014-05-05 DIAGNOSIS — I5032 Chronic diastolic (congestive) heart failure: Secondary | ICD-10-CM

## 2014-05-05 LAB — MDC_IDC_ENUM_SESS_TYPE_INCLINIC
Battery Remaining Longevity: 114 mo
Battery Voltage: 3.01 V
Date Time Interrogation Session: 20150820143932
Implantable Pulse Generator Model: 2240
Lead Channel Impedance Value: 437.5 Ohm
Lead Channel Pacing Threshold Amplitude: 0.75 V
Lead Channel Pacing Threshold Amplitude: 0.75 V
Lead Channel Pacing Threshold Amplitude: 0.75 V
Lead Channel Pacing Threshold Pulse Width: 0.4 ms
Lead Channel Sensing Intrinsic Amplitude: 1.3 mV
Lead Channel Setting Pacing Amplitude: 2.5 V
Lead Channel Setting Pacing Pulse Width: 0.4 ms
MDC IDC MSMT LEADCHNL RA PACING THRESHOLD AMPLITUDE: 0.75 V
MDC IDC MSMT LEADCHNL RA PACING THRESHOLD PULSEWIDTH: 0.4 ms
MDC IDC MSMT LEADCHNL RA PACING THRESHOLD PULSEWIDTH: 0.4 ms
MDC IDC MSMT LEADCHNL RV IMPEDANCE VALUE: 775 Ohm
MDC IDC MSMT LEADCHNL RV PACING THRESHOLD PULSEWIDTH: 0.4 ms
MDC IDC MSMT LEADCHNL RV SENSING INTR AMPL: 12 mV
MDC IDC PG SERIAL: 7617100
MDC IDC SET LEADCHNL RA PACING AMPLITUDE: 2 V
MDC IDC SET LEADCHNL RV SENSING SENSITIVITY: 2 mV
MDC IDC STAT BRADY RA PERCENT PACED: 0.07 %
MDC IDC STAT BRADY RV PERCENT PACED: 99.21 %

## 2014-05-05 NOTE — Patient Instructions (Addendum)
Your physician wants you to follow-up in: [place] DR. ALLRED. You will receive a reminder letter in the mail two months in advance. If you don't receive a letter, please call our office to schedule the follow-up appointment.  Remote monitoring is used to monitor your Pacemaker of ICD from home. This monitoring reduces the number of office visits required to check your device to one time per year. It allows [place] to keep an eye on the functioning of your device to ensure it is working properly. You are scheduled for a device check from home on 08/04/2014. You may send your transmission at any time that day. If you have a wireless device, the transmission will be sent automatically. After your physician reviews your transmission, you will receive a postcard with your next transmission date.

## 2014-05-05 NOTE — Progress Notes (Signed)
PCP:  Melinda Crutch, MD Primary Cardiologist:  Dr Jeanette Caprice is a 78 y.o. male who presents today for routine electrophysiology followup.  Since his recent PPM implant, the patient reports doing very well.  Today, he denies symptoms of palpitations, chest pain, shortness of breath,  lower extremity edema, dizziness, presyncope, or syncope.  The patient is otherwise without complaint today.   Past Medical History  Diagnosis Date  . Diabetes mellitus without complication   . Hypertension   . Hyperlipidemia   . Second degree heart block   . Chronic kidney disease, stage IV (severe)     baseline creatinine 2-3  . DDD (degenerative disc disease)   . Melanoma 2010    removed at [facility]  . Atrial fibrillation    Past Surgical History  Procedure Laterality Date  . Laminectomy    . Pacemaker insertion  01-20-2014    STJ Assurity dual chamber pacemaker implanted by Dr [name] for 2nd degree AV block    ROS- all systems are reviewed and negative except as per HPI above  Current Outpatient Prescriptions  Medication Sig Dispense Refill  . ACCU-CHEK AVIVA PLUS test strip       . acetaminophen (TYLENOL) 500 MG tablet Take 500 mg by mouth 2 (two) times daily as needed (pain).      Marland Kitchen aspirin EC 81 MG tablet Take 81 mg by mouth at bedtime.      . carvedilol (COREG) 6.25 MG tablet Take 1 tablet (6.25 mg total) by mouth 2 (two) times daily with a meal.  30 tablet  3  . Coenzyme Q10 (CO Q-10) 100 MG CAPS Take 100 mg by mouth daily.       Marland Kitchen ezetimibe (ZETIA) 10 MG tablet Take 10 mg by mouth daily.      . fenofibrate 160 MG tablet Take 160 mg by mouth daily.      . finasteride (PROSCAR) 5 MG tablet Take 5 mg by mouth daily.      . Flaxseed, Linseed, (FLAXSEED OIL) 1000 MG CAPS Take 2 capsules by mouth daily.      . furosemide (LASIX) 80 MG tablet Take 80 mg by mouth.      Marland Kitchen glimepiride (AMARYL) 4 MG tablet Take 4 mg by mouth daily with breakfast.      . ipratropium (ATROVENT) 0.03 % nasal spray  Place 2 sprays into both nostrils 3 (three) times daily.       Marland Kitchen KRILL OIL OMEGA-3 PO Take 1 capsule by mouth at bedtime.      . tamsulosin (FLOMAX) 0.4 MG CAPS capsule Take 0.4 mg by mouth 3 (three) times a week. Monday, Wednesday and Friday (at bedtime)       No current facility-administered medications for this visit.    Physical Exam: Filed Vitals:   05/05/14 1019  BP: 120/80  Pulse: 74  Height: 5\' 10"  (1.778 m)  Weight: 197 lb 12.8 oz (89.721 kg)    GEN- The patient is well appearing, alert and oriented x 3 today.   Head- normocephalic, atraumatic Eyes-  Sclera clear, conjunctiva pink Ears- hearing intact Oropharynx- clear Lungs- Clear to ausculation bilaterally, normal work of breathing Chest- pacemaker pocket is well healed Heart- Regular rate and rhythm, no murmurs, rubs or gallops, PMI not laterally displaced GI- soft, NT, ND, + BS Extremities- no clubbing, cyanosis, or edema  Pacemaker interrogation- reviewed in detail today,  See PACEART report  Assessment and Plan:  1. Second degree AV block  Normal pacemaker function See Pace Art report No changes today  2. Atrial flutter Detected by PPM implant initially but appears improved (burden <15) Will monitor for increased arrhythmia burden.  IF he has further episodes would consider initiation of coumadin.  Pt would like to avoid this if able.  He is not presently a candidate for NOAC therapy due to renal failure  3. CRI Per Dr Laurence Spates compliant Return to see me in 1 year Follow-up with Dr Tamala Julian as scheduled

## 2014-05-11 ENCOUNTER — Encounter: Payer: Self-pay | Admitting: Internal Medicine

## 2014-05-17 ENCOUNTER — Encounter (HOSPITAL_COMMUNITY): Payer: Self-pay | Admitting: Pharmacy Technician

## 2014-05-17 ENCOUNTER — Other Ambulatory Visit: Payer: Self-pay | Admitting: *Deleted

## 2014-05-25 DIAGNOSIS — Z85820 Personal history of malignant melanoma of skin: Secondary | ICD-10-CM | POA: Insufficient documentation

## 2014-05-25 DIAGNOSIS — I509 Heart failure, unspecified: Secondary | ICD-10-CM | POA: Insufficient documentation

## 2014-05-30 ENCOUNTER — Encounter (HOSPITAL_COMMUNITY): Payer: Self-pay | Admitting: *Deleted

## 2014-05-30 MED ORDER — DEXTROSE 5 % IV SOLN
1.5000 g | INTRAVENOUS | Status: AC
  Administered 2014-05-31: 1.5 g via INTRAVENOUS
  Filled 2014-05-30: qty 1.5

## 2014-05-31 ENCOUNTER — Ambulatory Visit (HOSPITAL_COMMUNITY): Payer: Medicare Other | Admitting: Anesthesiology

## 2014-05-31 ENCOUNTER — Ambulatory Visit (HOSPITAL_COMMUNITY)
Admission: RE | Admit: 2014-05-31 | Discharge: 2014-05-31 | Disposition: A | Discharge status: Home / Self Care | Payer: Medicare Other | Source: Ambulatory Visit | Attending: Vascular Surgery | Admitting: Vascular Surgery

## 2014-05-31 ENCOUNTER — Encounter (HOSPITAL_COMMUNITY): Payer: Self-pay | Admitting: Certified Registered"

## 2014-05-31 ENCOUNTER — Encounter (HOSPITAL_COMMUNITY): Payer: Medicare Other | Admitting: Anesthesiology

## 2014-05-31 ENCOUNTER — Telehealth: Payer: Self-pay | Admitting: Vascular Surgery

## 2014-05-31 ENCOUNTER — Encounter (HOSPITAL_COMMUNITY)
Admission: RE | Disposition: A | Discharge status: Home / Self Care | Payer: Self-pay | Source: Ambulatory Visit | Attending: Vascular Surgery

## 2014-05-31 DIAGNOSIS — Z950 Presence of cardiac pacemaker: Secondary | ICD-10-CM | POA: Diagnosis not present

## 2014-05-31 DIAGNOSIS — N184 Chronic kidney disease, stage 4 (severe): Secondary | ICD-10-CM | POA: Insufficient documentation

## 2014-05-31 DIAGNOSIS — IMO0002 Reserved for concepts with insufficient information to code with codable children: Secondary | ICD-10-CM | POA: Insufficient documentation

## 2014-05-31 DIAGNOSIS — I441 Atrioventricular block, second degree: Secondary | ICD-10-CM | POA: Insufficient documentation

## 2014-05-31 DIAGNOSIS — I4891 Unspecified atrial fibrillation: Secondary | ICD-10-CM | POA: Insufficient documentation

## 2014-05-31 DIAGNOSIS — Z85820 Personal history of malignant melanoma of skin: Secondary | ICD-10-CM | POA: Insufficient documentation

## 2014-05-31 DIAGNOSIS — E785 Hyperlipidemia, unspecified: Secondary | ICD-10-CM | POA: Diagnosis not present

## 2014-05-31 DIAGNOSIS — Z992 Dependence on renal dialysis: Secondary | ICD-10-CM | POA: Diagnosis not present

## 2014-05-31 DIAGNOSIS — I129 Hypertensive chronic kidney disease with stage 1 through stage 4 chronic kidney disease, or unspecified chronic kidney disease: Secondary | ICD-10-CM | POA: Insufficient documentation

## 2014-05-31 DIAGNOSIS — E119 Type 2 diabetes mellitus without complications: Secondary | ICD-10-CM | POA: Diagnosis not present

## 2014-05-31 HISTORY — PX: AV FISTULA PLACEMENT: SHX1204

## 2014-05-31 LAB — GLUCOSE, CAPILLARY
GLUCOSE-CAPILLARY: 225 mg/dL — AB (ref 70–99)
Glucose-Capillary: 184 mg/dL — ABNORMAL HIGH (ref 70–99)

## 2014-05-31 LAB — POCT I-STAT 4, (NA,K, GLUC, HGB,HCT)
GLUCOSE: 210 mg/dL — AB (ref 70–99)
HCT: 42 % (ref 39.0–52.0)
HEMOGLOBIN: 14.3 g/dL (ref 13.0–17.0)
Potassium: 4.3 mEq/L (ref 3.7–5.3)
Sodium: 138 mEq/L (ref 137–147)

## 2014-05-31 SURGERY — ARTERIOVENOUS (AV) FISTULA CREATION
Anesthesia: General | Site: Arm Upper | Laterality: Right

## 2014-05-31 MED ORDER — MEPERIDINE HCL 25 MG/ML IJ SOLN: 6.2500 mg | INTRAMUSCULAR | Status: DC | PRN

## 2014-05-31 MED ORDER — FENTANYL CITRATE 0.05 MG/ML IJ SOLN
INTRAMUSCULAR | Status: DC | PRN
Start: 2014-05-31 — End: 2014-05-31
  Administered 2014-05-31: 100 ug via INTRAVENOUS

## 2014-05-31 MED ORDER — OXYCODONE-ACETAMINOPHEN 5-325 MG PO TABS: 1.0000 | ORAL_TABLET | Freq: Four times a day (QID) | ORAL | Status: DC | PRN

## 2014-05-31 MED ORDER — FENTANYL CITRATE 0.05 MG/ML IJ SOLN: 25.0000 ug | INTRAMUSCULAR | Status: DC | PRN

## 2014-05-31 MED ORDER — LIDOCAINE HCL (CARDIAC) 20 MG/ML IV SOLN
INTRAVENOUS | Status: DC | PRN
  Administered 2014-05-31: 100 mg via INTRAVENOUS

## 2014-05-31 MED ORDER — THROMBIN 20000 UNITS EX SOLR
CUTANEOUS | Status: DC | PRN
  Administered 2014-05-31: 11:00:00 via TOPICAL

## 2014-05-31 MED ORDER — CHLORHEXIDINE GLUCONATE CLOTH 2 % EX PADS: 6.0000 | MEDICATED_PAD | Freq: Once | CUTANEOUS | Status: DC

## 2014-05-31 MED ORDER — PROPOFOL 10 MG/ML IV BOLUS
INTRAVENOUS | Status: DC | PRN
  Administered 2014-05-31: 150 mg via INTRAVENOUS

## 2014-05-31 MED ORDER — 0.9 % SODIUM CHLORIDE (POUR BTL) OPTIME
TOPICAL | Status: DC | PRN
  Administered 2014-05-31: 1000 mL

## 2014-05-31 MED ORDER — ONDANSETRON HCL 4 MG/2ML IJ SOLN
INTRAMUSCULAR | Status: DC | PRN
  Administered 2014-05-31: 4 mg via INTRAVENOUS

## 2014-05-31 MED ORDER — SODIUM CHLORIDE 0.9 % IV SOLN
INTRAVENOUS | Status: DC
  Administered 2014-05-31 (×2): via INTRAVENOUS

## 2014-05-31 MED ORDER — PROMETHAZINE HCL 25 MG/ML IJ SOLN: 6.2500 mg | INTRAMUSCULAR | Status: DC | PRN

## 2014-05-31 MED ORDER — EPHEDRINE SULFATE 50 MG/ML IJ SOLN
INTRAMUSCULAR | Status: DC | PRN
  Administered 2014-05-31: 5 mg via INTRAVENOUS
  Administered 2014-05-31: 10 mg via INTRAVENOUS
  Administered 2014-05-31 (×2): 5 mg via INTRAVENOUS

## 2014-05-31 MED ORDER — PHENYLEPHRINE HCL 10 MG/ML IJ SOLN
INTRAMUSCULAR | Status: DC | PRN
  Administered 2014-05-31: 80 ug via INTRAVENOUS
  Administered 2014-05-31: 120 ug via INTRAVENOUS
  Administered 2014-05-31 (×3): 80 ug via INTRAVENOUS

## 2014-05-31 MED ORDER — SODIUM CHLORIDE 0.9 % IR SOLN
Status: DC | PRN
  Administered 2014-05-31: 11:00:00

## 2014-05-31 SURGICAL SUPPLY — 35 items
ARMBAND PINK RESTRICT EXTREMIT (MISCELLANEOUS) ×3 IMPLANT
BLADE SURG 10 STRL SS (BLADE) ×3 IMPLANT
CANISTER SUCTION 2500CC (MISCELLANEOUS) ×3 IMPLANT
CLIP TI MEDIUM 6 (CLIP) ×3 IMPLANT
CLIP TI WIDE RED SMALL 6 (CLIP) ×3 IMPLANT
COVER PROBE W GEL 5X96 (DRAPES) ×3 IMPLANT
COVER SURGICAL LIGHT HANDLE (MISCELLANEOUS) ×3 IMPLANT
DECANTER SPIKE VIAL GLASS SM (MISCELLANEOUS) ×3 IMPLANT
DERMABOND ADVANCED (GAUZE/BANDAGES/DRESSINGS) ×2
DERMABOND ADVANCED .7 DNX12 (GAUZE/BANDAGES/DRESSINGS) ×1 IMPLANT
ELECT REM PT RETURN 9FT ADLT (ELECTROSURGICAL) ×3
ELECTRODE REM PT RTRN 9FT ADLT (ELECTROSURGICAL) ×1 IMPLANT
GLOVE BIO SURGEON STRL SZ7 (GLOVE) ×3 IMPLANT
GLOVE BIOGEL PI IND STRL 6.5 (GLOVE) ×2 IMPLANT
GLOVE BIOGEL PI IND STRL 7.5 (GLOVE) ×2 IMPLANT
GLOVE BIOGEL PI INDICATOR 6.5 (GLOVE) ×4
GLOVE BIOGEL PI INDICATOR 7.5 (GLOVE) ×4
GLOVE ECLIPSE 6.5 STRL STRAW (GLOVE) ×3 IMPLANT
GLOVE SS BIOGEL STRL SZ 7 (GLOVE) ×1 IMPLANT
GLOVE SUPERSENSE BIOGEL SZ 7 (GLOVE) ×2
GOWN STRL REUS W/ TWL LRG LVL3 (GOWN DISPOSABLE) ×3 IMPLANT
GOWN STRL REUS W/TWL LRG LVL3 (GOWN DISPOSABLE) ×6
KIT BASIN OR (CUSTOM PROCEDURE TRAY) ×3 IMPLANT
KIT ROOM TURNOVER OR (KITS) ×3 IMPLANT
NS IRRIG 1000ML POUR BTL (IV SOLUTION) ×3 IMPLANT
PACK CV ACCESS (CUSTOM PROCEDURE TRAY) ×3 IMPLANT
PAD ARMBOARD 7.5X6 YLW CONV (MISCELLANEOUS) ×6 IMPLANT
SPONGE SURGIFOAM ABS GEL 100 (HEMOSTASIS) ×3 IMPLANT
SUT MNCRL AB 4-0 PS2 18 (SUTURE) ×3 IMPLANT
SUT PROLENE 6 0 BV (SUTURE) IMPLANT
SUT PROLENE 7 0 BV 1 (SUTURE) ×3 IMPLANT
SUT VIC AB 3-0 SH 27 (SUTURE) ×2
SUT VIC AB 3-0 SH 27X BRD (SUTURE) ×1 IMPLANT
UNDERPAD 30X30 INCONTINENT (UNDERPADS AND DIAPERS) ×3 IMPLANT
WATER STERILE IRR 1000ML POUR (IV SOLUTION) ×3 IMPLANT

## 2014-05-31 NOTE — Telephone Encounter (Signed)
Message copied by Gena Fray on Tue May 31, 2014  3:23 PM ------      Message from: Mena Goes      Created: Tue May 31, 2014 12:06 PM      Regarding: schedule                   ----- Message -----         From: Conrad [place], MD         Sent: 05/31/2014  11:33 AM           To: [facility] [address]            RULON ABDALLA      [id number]      10-06-1935            Procedure: R BC AVF            Asst: Gerri Lins, [facility]             Follow-up: 6 weeks ------

## 2014-05-31 NOTE — Op Note (Signed)
OPERATIVE NOTE   PROCEDURE: right brachiocephalic arteriovenous fistula placement  PRE-OPERATIVE DIAGNOSIS: chronic kidney disease stage IV   POST-OPERATIVE DIAGNOSIS: same as above   SURGEON: Adele Barthel, MD  ASSISTANT(S): Gerri Lins, PAC   ANESTHESIA: general  ESTIMATED BLOOD LOSS: 30 cc  FINDING(S): 1.  Calcified distal radial artery 2.  Palpable radial pulse and thrill at end of case  SPECIMEN(S):  none  INDICATIONS:   James David is a 78 y.o. male who presents with chronic kidney disease stage IV .  The patient is scheduled for right radiocephalic vs brachiocephalic arteriovenous fistula placement.  The patient is aware the risks include but are not limited to: bleeding, infection, steal syndrome, nerve damage, ischemic monomelic neuropathy, failure to mature, and need for additional procedures.  The patient is aware of the risks of the procedure and elects to proceed forward.  DESCRIPTION: After full informed written consent was obtained from the patient, the patient was brought back to the operating room and placed supine upon the operating table.  Prior to induction, the patient received IV antibiotics.   After obtaining adequate anesthesia, the patient was then prepped and draped in the standard fashion for a right arm access procedure.  I turned my attention first to identifying the patient's distal cephalic vein and radial artery.  The vein appeared to be suitably sized but the radial artery was not compressible for much of the distal artery.  I turned my attention to the antecubitum, where I identified the cephalic vein and brachial artery.  Using SonoSite guidance, the location of these vessels were marked out on the skin.   I made a longitudinal incision at the level of the antecubitum and dissected through the subcutaneous tissue and fascia to gain exposure of the brachial artery.  This was noted to be 4-5 mm in diameter externally.  This was dissected out  proximally and distally and controlled with vessel loops .  I then dissected out the cephalic vein.  This was noted to be 4-5 mm in diameter externally.  The distal segment of the vein was ligated with a  2-0 silk, and the vein was transected.  The proximal segment was iinterrogated with serial dilators.  The vein accepted up to a 4 mm dilator without any difficulty.  I then instilled the heparinized saline into the vein and clamped it.  At this point, I reset my exposure of the brachial artery and placed the artery under tension proximally and distally.  I made an arteriotomy with a #11 blade, and then I extended the arteriotomy with a Potts scissor.  I injected heparinized saline proximal and distal to this arteriotomy.  The vein was then sewn to the artery in an end-to-side configuration with a running stitch of 7-0 Prolene.  Prior to completing this anastomosis, I allowed the vein and artery to backbleed.  There was no evidence of clot from any vessels.  I completed the anastomosis in the usual fashion and then released all vessel loops and clamps.  There was a palpable thrill in the venous outflow, and there was a palpable radial pulse.  At this point, I irrigated out the surgical wound.  There was no further active bleeding.  The subcutaneous tissue was reapproximated with a running stitch of 3-0 Vicryl.  The skin was then reapproximated with a running subcuticular stitch of 4-0 Vicryl.  The skin was then cleaned, dried, and reinforced with Dermabond.  The patient tolerated this procedure well.  COMPLICATIONS: none  CONDITION: stable  Adele Barthel, MD [facility] Office: [phone] Pager: [phone]  05/31/2014, 11:25 AM

## 2014-05-31 NOTE — Telephone Encounter (Signed)
Spoke with patient to schedule appt for 07/15/14 @ 8:45am, dpm

## 2014-05-31 NOTE — Progress Notes (Signed)
CBG=225. Dr.Manny notified. No new orders--pt is to take med that he skipped this morning when he gets home and is eating

## 2014-05-31 NOTE — Discharge Instructions (Signed)

## 2014-05-31 NOTE — Anesthesia Procedure Notes (Signed)
Procedure Name: LMA Insertion Date/Time: 05/31/2014 10:19 AM Performed by: Julian Reil Pre-anesthesia Checklist: Patient identified, Emergency Drugs available, Suction available and Patient being monitored Patient Re-evaluated:Patient Re-evaluated prior to inductionOxygen Delivery Method: Circle system utilized Preoxygenation: Pre-oxygenation with 100% oxygen Intubation Type: IV induction Ventilation: Mask ventilation without difficulty LMA: LMA inserted LMA Size: 5.0 Tube type: Oral Number of attempts: 1 Placement Confirmation: positive ETCO2 and breath sounds checked- equal and bilateral Tube secured with: Tape Dental Injury: Teeth and Oropharynx as per pre-operative assessment

## 2014-05-31 NOTE — Anesthesia Postprocedure Evaluation (Signed)
  Anesthesia Post-op Note  Patient: James David  Procedure(s) Performed: Procedure(s): Right Arm Brachiocephalic ARTERIOVENOUS FISTULA CREATION   (Right)  Patient Location: PACU  Anesthesia Type:General  Level of Consciousness: awake and alert   Airway and Oxygen Therapy: Patient Spontanous Breathing  Post-op Pain: mild  Post-op Assessment: Post-op Vital signs reviewed, Patient's Cardiovascular Status Stable and Respiratory Function Stable  Post-op Vital Signs: Reviewed and stable  Last Vitals:  Filed Vitals:   05/31/14 0828  BP: 151/65  Pulse: 72  Temp: 36.2 C  Resp: 18    Complications: No apparent anesthesia complications

## 2014-05-31 NOTE — H&P (Signed)
Brief History and Physical  History of Present Illness  James David is a 78 y.o. male who presents with chief complaint: chronic kidney disease stage IV.  The patient presents today for Placement of R RC AVF vs BC AVF.    Past Medical History  Diagnosis Date  . Diabetes mellitus without complication   . Hypertension   . Hyperlipidemia   . Second degree heart block   . Chronic kidney disease, stage IV (severe)     baseline creatinine 2-3  . DDD (degenerative disc disease)   . Melanoma 2010    removed at [facility]  . Atrial fibrillation   . Pacemaker     Past Surgical History  Procedure Laterality Date  . Laminectomy    . Pacemaker insertion  01-20-2014    STJ Assurity dual chamber pacemaker implanted by Dr [name] for 2nd degree AV block  . Colonoscopy    . Cataract extraction w/ intraocular lens  implant, bilateral      History   Social History  . Marital Status: Married    Spouse Name: N/A    Number of Children: N/A  . Years of Education: N/A   Occupational History  . Not on file.   Social History Main Topics  . Smoking status: Never Smoker   . Smokeless tobacco: Never Used  . Alcohol Use: No  . Drug Use: No  . Sexual Activity: Not on file   Other Topics Concern  . Not on file   Social History Narrative   Lives in [place] with spouse.  Retired Theatre manager for [employer].    Family History  Problem Relation Age of Onset  . Pulmonary embolism Mother   . Diabetes Mother   . Heart attack Father   . Diabetes Brother     No current facility-administered medications on file prior to encounter.   Current Outpatient Prescriptions on File Prior to Encounter  Medication Sig Dispense Refill  . ACCU-CHEK AVIVA PLUS test strip       . aspirin EC 81 MG tablet Take 81 mg by mouth at bedtime.      . carvedilol (COREG) 6.25 MG tablet Take 1 tablet (6.25 mg total) by mouth 2 (two) times daily with a meal.  30 tablet  3  . Coenzyme Q10 (CO Q-10) 100 MG  CAPS Take 100 mg by mouth daily.       . fenofibrate 160 MG tablet Take 160 mg by mouth daily.      . finasteride (PROSCAR) 5 MG tablet Take 5 mg by mouth daily.      . Flaxseed, Linseed, (FLAXSEED OIL) 1000 MG CAPS Take 1 capsule by mouth 2 (two) times daily.       . furosemide (LASIX) 80 MG tablet Take 80 mg by mouth.      Marland Kitchen glimepiride (AMARYL) 4 MG tablet Take 4 mg by mouth daily with breakfast.      . ipratropium (ATROVENT) 0.03 % nasal spray Place 2 sprays into both nostrils 3 (three) times daily.       Marland Kitchen KRILL OIL OMEGA-3 PO Take 1 capsule by mouth at bedtime.      . tamsulosin (FLOMAX) 0.4 MG CAPS capsule Take 0.4 mg by mouth 3 (three) times a week. Monday, Wednesday and Friday (at bedtime)        Allergies  Allergen Reactions  . Amlodipine Besylate Swelling    angioedema  . Coreg [Carvedilol] Other (See Comments)  Caused 2nd degree HB  . Lisinopril Other (See Comments)    Renal insufficiency  . Statins Other (See Comments)    Muscle aches    Review of Systems: As listed above, otherwise negative.  Physical Examination  Filed Vitals:   05/31/14 0828  BP: 151/65  Pulse: 72  Temp: 97.1 F (36.2 C)  TempSrc: Oral  Resp: 18  Height: 5\' 10"  (1.778 m)  Weight: 195 lb (88.451 kg)  SpO2: 99%    General: A&O x 3, WDWN  Pulmonary: Sym exp, good air movt, CTAB, no rales, rhonchi, & wheezing  Cardiac: RRR, Nl S1, S2, no Murmurs, rubs or gallops  Gastrointestinal: soft, NTND, -G/R, - HSM, - masses, - CVAT B  Musculoskeletal: M/S 5/5 throughout , Extremities without ischemic changes   Laboratory See [place] is a 78 y.o. male who presents with: chronic kidney disease stage IV .   The patient is scheduled for: R RC vs BC AVF  Risk, benefits, and alternatives to access surgery were discussed.  The patient is aware the risks include but are not limited to: bleeding, infection, steal syndrome, nerve damage, ischemic monomelic  neuropathy, failure to mature, and need for additional procedures.  The patient is aware of the risks and agrees to proceed.  Adele Barthel, MD [facility] Office: [phone] Pager: [phone]  05/31/2014, 9:30 AM

## 2014-05-31 NOTE — Anesthesia Preprocedure Evaluation (Addendum)
Anesthesia Evaluation  Patient identified by MRN, date of birth, ID band Patient awake    Reviewed: Allergy & Precautions, H&P , NPO status , Patient's Chart, lab work & pertinent test results  Airway Mallampati: II TM Distance: >3 FB     Dental  (+) Teeth Intact, Dental Advisory Given   Pulmonary  breath sounds clear to auscultation        Cardiovascular hypertension, Pt. on medications + dysrhythmias Atrial Fibrillation + pacemaker Rhythm:Regular Rate:Normal  EF 55% 2015   Neuro/Psych    GI/Hepatic   Endo/Other  diabetes, Poorly Controlled, Type 2, Oral Hypoglycemic Agents  Renal/GU      Musculoskeletal   Abdominal (+)  Abdomen: soft.    Peds  Hematology   Anesthesia Other Findings   Reproductive/Obstetrics                          Anesthesia Physical Anesthesia Plan  ASA: IV  Anesthesia Plan: General   Post-op Pain Management:    Induction: Intravenous  Airway Management Planned: LMA and Oral ETT  Additional Equipment:   Intra-op Plan:   Post-operative Plan:   Informed Consent: I have reviewed the patients History and Physical, chart, labs and discussed the procedure including the risks, benefits and alternatives for the proposed anesthesia with the patient or authorized representative who has indicated his/her understanding and acceptance.     Plan Discussed with:   Anesthesia Plan Comments: (Would like resent K andHCT)        Anesthesia Quick Evaluation

## 2014-05-31 NOTE — Transfer of Care (Signed)
Immediate Anesthesia Transfer of Care Note  Patient: James David  Procedure(s) Performed: Procedure(s): Right Arm Brachiocephalic ARTERIOVENOUS FISTULA CREATION   (Right)  Patient Location: PACU  Anesthesia Type:General  Level of Consciousness: awake, alert , oriented and patient cooperative  Airway & Oxygen Therapy: Patient Spontanous Breathing and Patient connected to nasal cannula oxygen  Post-op Assessment: Report given to PACU RN, Post -op Vital signs reviewed and stable and Patient moving all extremities  Post vital signs: Reviewed and stable  Complications: No apparent anesthesia complications

## 2014-06-02 ENCOUNTER — Ambulatory Visit (INDEPENDENT_AMBULATORY_CARE_PROVIDER_SITE_OTHER): Payer: Medicare Other | Admitting: Podiatry

## 2014-06-02 ENCOUNTER — Encounter (HOSPITAL_COMMUNITY): Payer: Self-pay | Admitting: Vascular Surgery

## 2014-06-02 DIAGNOSIS — B351 Tinea unguium: Secondary | ICD-10-CM

## 2014-06-02 DIAGNOSIS — M79673 Pain in unspecified foot: Secondary | ICD-10-CM

## 2014-06-02 DIAGNOSIS — L600 Ingrowing nail: Secondary | ICD-10-CM

## 2014-06-02 DIAGNOSIS — M79609 Pain in unspecified limb: Secondary | ICD-10-CM

## 2014-06-02 NOTE — Progress Notes (Signed)
   Subjective:    Patient ID: James David, male    DOB: 1936-05-01, 78 y.o.   MRN: [id number]  HPI  Pt presents for nail debridement, left great toe, redden at the corner, possible ingrown Review of Systems     Objective:   Physical Exam        Assessment & Plan:

## 2014-06-02 NOTE — Patient Instructions (Addendum)
Diabetes and Foot Care Diabetes may cause you to have problems because of poor blood supply (circulation) to your feet and legs. This may cause the skin on your feet to become thinner, break easier, and heal more slowly. Your skin may become dry, and the skin may peel and crack. You may also have nerve damage in your legs and feet causing decreased feeling in them. You may not notice minor injuries to your feet that could lead to infections or more serious problems. Taking care of your feet is one of the most important things you can do for yourself.  HOME CARE INSTRUCTIONS  Wear shoes at all times, even in the house. Do not go barefoot. Bare feet are easily injured.  Check your feet daily for blisters, cuts, and redness. If you cannot see the bottom of your feet, use a mirror or ask someone for help.  Wash your feet with warm water (do not use hot water) and mild soap. Then pat your feet and the areas between your toes until they are completely dry. Do not soak your feet as this can dry your skin.  Apply a moisturizing lotion or petroleum jelly (that does not contain alcohol and is unscented) to the skin on your feet and to dry, brittle toenails. Do not apply lotion between your toes.  Trim your toenails straight across. Do not dig under them or around the cuticle. File the edges of your nails with an emery board or nail file.  Do not cut corns or calluses or try to remove them with medicine.  Wear clean socks or stockings every day. Make sure they are not too tight. Do not wear knee-high stockings since they may decrease blood flow to your legs.  Wear shoes that fit properly and have enough cushioning. To break in new shoes, wear them for just a few hours a day. This prevents you from injuring your feet. Always look in your shoes before you put them on to be sure there are no objects inside.  Do not cross your legs. This may decrease the blood flow to your feet.  If you find a minor scrape,  cut, or break in the skin on your feet, keep it and the skin around it clean and dry. These areas may be cleansed with mild soap and water. Do not cleanse the area with peroxide, alcohol, or iodine.  When you remove an adhesive bandage, be sure not to damage the skin around it.  If you have a wound, look at it several times a day to make sure it is healing.  Do not use heating pads or hot water bottles. They may burn your skin. If you have lost feeling in your feet or legs, you may not know it is happening until it is too late.  Make sure your health care provider performs a complete foot exam at least annually or more often if you have foot problems. Report any cuts, sores, or bruises to your health care provider immediately. SEEK MEDICAL CARE IF:   You have an injury that is not healing.  You have cuts or breaks in the skin.  You have an ingrown nail.  You notice redness on your legs or feet.  You feel burning or tingling in your legs or feet.  You have pain or cramps in your legs and feet.  Your legs or feet are numb.  Your feet always feel cold. SEEK IMMEDIATE MEDICAL CARE IF:   There is increasing redness,   swelling, or pain in or around a wound.  There is a red line that goes up your leg.  Pus is coming from a wound.  You develop a fever or as directed by your health care provider.  You notice a bad smell coming from an ulcer or wound. Document Released: 08/30/2000 Document Revised: 05/05/2013 Document Reviewed: 02/09/2013 [facility] Patient Information 2015 [place], [place]. This information is not intended to replace advice given to you by your health care provider. Make sure you discuss any questions you have with your health care provider. Betadine Soak Instructions  Purchase an 8 oz. bottle of BETADINE solution (Povidone)  THE DAY AFTER THE PROCEDURE  Place 1 tablespoon of betadine solution in a quart of warm tap water.  Submerge your foot or feet with outer  bandage intact for the initial soak; this will allow the bandage to become moist and wet for easy lift off.  Once you remove your bandage, continue to soak in the solution for 20 minutes.  This soak should be done twice a day.  Next, remove your foot or feet from solution, blot dry the affected area and cover.  You may use a band aid large enough to cover the area or use gauze and tape.  Apply other medications to the area as directed by the doctor such as cortisporin otic solution (ear drops) or neosporin.  IF YOUR SKIN BECOMES IRRITATED WHILE USING THESE INSTRUCTIONS, IT IS OKAY TO SWITCH TO EPSOM SALTS AND WATER OR WHITE VINEGAR AND WATER.

## 2014-06-06 NOTE — Progress Notes (Signed)
Subjective:     Patient ID: James David, male   DOB: 12/22/1935, 78 y.o.   MRN: [id number]  HPI patient presents with nail disease 1-5 both feet and incurvated right hallux nail that makes it difficult for him to wear shoe gear with them he has tried from out on his   Review of Systems     Objective:   Physical Exam Neurovascular status is noted to be intact with good digital perfusion and warm toes upon palpation. Patient's noted to have incurvated nailbeds hallux right that's painful when pressed and nail disease with thickness yellow subungual debris and pain 1-5 of both feet    Assessment:     Ingrown toenail deformity and mycotic nail infection of both feet    Plan:     Reviewed both conditions and today recommended correction of the ingrown explaining risk. Infiltrated 60 mg Xylocaine Marcaine mixture remove the corner exposed matrix and apply chemical phenol 3 applications followed by alcohol lavaged and sterile dressing. Instructed on soaks and debrided remaining nailbeds with no iatrogenic bleeding noted

## 2014-06-27 ENCOUNTER — Other Ambulatory Visit (HOSPITAL_COMMUNITY): Payer: Self-pay | Admitting: Interventional Cardiology

## 2014-07-14 ENCOUNTER — Encounter: Payer: Self-pay | Admitting: Vascular Surgery

## 2014-07-15 ENCOUNTER — Encounter: Payer: Self-pay | Admitting: Vascular Surgery

## 2014-07-15 ENCOUNTER — Ambulatory Visit (INDEPENDENT_AMBULATORY_CARE_PROVIDER_SITE_OTHER): Payer: Medicare Other | Admitting: Vascular Surgery

## 2014-07-15 VITALS — BP 142/66 | HR 70 | Ht 70.0 in | Wt 195.4 lb

## 2014-07-15 DIAGNOSIS — N186 End stage renal disease: Secondary | ICD-10-CM

## 2014-07-15 NOTE — Progress Notes (Signed)
    Postoperative Access Visit   History of Present Illness  James David is a 78 y.o. year old male who presents for postoperative follow-up for: R BC AVF (Date: 05/31/14).  The patient's wounds are healed.  The patient notes no steal symptoms.  The patient is able to complete their activities of daily living.  The patient's current symptoms are: none.  For VQI Use Only  PRE-ADM LIVING: Home  AMB STATUS: Ambulatory  Physical Examination Filed Vitals:   07/15/14 0850  BP: 142/66  Pulse: 70    RUE: Incision is healed, skin feels warm, hand grip is 5/5, sensation in digits is intact, palpable thrill, bruit can be auscultated , on Sonosite: fistula > 6 mm throughout  [place] is a 78 y.o. year old male who presents s/p R BC AVF.  The patient's access is ready for use.  Thank you for allowing [place] to participate in this patient's care.  Adele Barthel, MD [facility] Office: [phone] Pager: [phone]  07/15/2014, 9:09 AM

## 2014-07-22 ENCOUNTER — Encounter: Payer: Self-pay | Admitting: Interventional Cardiology

## 2014-08-04 ENCOUNTER — Ambulatory Visit (INDEPENDENT_AMBULATORY_CARE_PROVIDER_SITE_OTHER): Payer: Medicare Other | Admitting: *Deleted

## 2014-08-04 DIAGNOSIS — I441 Atrioventricular block, second degree: Secondary | ICD-10-CM

## 2014-08-04 NOTE — Progress Notes (Signed)
Remote pacemaker transmission.   

## 2014-08-05 LAB — MDC_IDC_ENUM_SESS_TYPE_REMOTE
Battery Remaining Longevity: 114 mo
Battery Remaining Percentage: 95.5 %
Brady Statistic AS VS Percent: 1 %
Brady Statistic RA Percent Paced: 1 %
Brady Statistic RV Percent Paced: 99 %
Implantable Pulse Generator Model: 2240
Lead Channel Impedance Value: 440 Ohm
Lead Channel Pacing Threshold Amplitude: 0.75 V
Lead Channel Pacing Threshold Amplitude: 0.75 V
Lead Channel Pacing Threshold Pulse Width: 0.4 ms
Lead Channel Pacing Threshold Pulse Width: 0.4 ms
Lead Channel Setting Pacing Amplitude: 2 V
MDC IDC MSMT BATTERY VOLTAGE: 3.01 V
MDC IDC MSMT LEADCHNL RA SENSING INTR AMPL: 0.8 mV
MDC IDC MSMT LEADCHNL RV IMPEDANCE VALUE: 700 Ohm
MDC IDC MSMT LEADCHNL RV SENSING INTR AMPL: 12 mV
MDC IDC PG SERIAL: 7617100
MDC IDC SESS DTM: 20151119070015
MDC IDC SET LEADCHNL RV PACING AMPLITUDE: 2.5 V
MDC IDC SET LEADCHNL RV PACING PULSEWIDTH: 0.4 ms
MDC IDC SET LEADCHNL RV SENSING SENSITIVITY: 2 mV
MDC IDC STAT BRADY AP VP PERCENT: 1 %
MDC IDC STAT BRADY AP VS PERCENT: 1 %
MDC IDC STAT BRADY AS VP PERCENT: 99 %

## 2014-08-17 ENCOUNTER — Encounter: Payer: Self-pay | Admitting: Cardiology

## 2014-08-22 ENCOUNTER — Encounter: Payer: Self-pay | Admitting: Internal Medicine

## 2014-08-25 ENCOUNTER — Encounter (HOSPITAL_COMMUNITY): Payer: Self-pay | Admitting: Internal Medicine

## 2014-08-29 ENCOUNTER — Ambulatory Visit (INDEPENDENT_AMBULATORY_CARE_PROVIDER_SITE_OTHER): Payer: Medicare Other | Admitting: Podiatry

## 2014-08-29 DIAGNOSIS — L03032 Cellulitis of left toe: Secondary | ICD-10-CM

## 2014-08-29 DIAGNOSIS — M79673 Pain in unspecified foot: Secondary | ICD-10-CM

## 2014-08-29 DIAGNOSIS — B351 Tinea unguium: Secondary | ICD-10-CM

## 2014-08-29 NOTE — Progress Notes (Signed)
Subjective:     Patient ID: James David, male   DOB: July 16, 1936, 78 y.o.   MRN: [id number]  HPI patient presents with nail disease 1-5 both feet and incurvated right hallux nail that makes it difficult for him to wear shoe gear with them he has tried from out on his   Review of Systems     Objective:   Physical Exam Neurovascular status is noted to be intact with good digital perfusion and warm toes upon palpation. Patient's noted to have incurvated nailbeds hallux right that's painful when pressed and nail disease with thickness yellow subungual debris and pain 1-5 of both feet    Assessment:     Ingrown toenail deformity and mycotic nail infection of both feet    Plan:     Reviewed both conditions and today recommended correction of the ingrown explaining risk. Infiltrated 60 mg Xylocaine Marcaine mixture remove the corner exposed matrix and apply chemical phenol 3 applications followed by alcohol lavaged and sterile dressing. Instructed on soaks and debrided remaining nailbeds with no iatrogenic bleeding noted

## 2014-08-31 NOTE — Progress Notes (Signed)
Subjective:     Patient ID: James David, male   DOB: 10/14/1935, 78 y.o.   MRN: [id number]  HPI patient presents with nail disease and thickness 1-5 both feet that he cannot cut and they become painful   Review of Systems     Objective:   Physical Exam Neurovascular status intact with thick yellow brittle nailbeds 1-5 both feet that are painful    Assessment:     Mycotic nail infections with pain 1-5 both feet    Plan:     Debride painful nailbeds 1-5 both feet with no iatrogenic bleeding noted      

## 2014-09-01 ENCOUNTER — Other Ambulatory Visit: Payer: Medicare Other

## 2014-10-13 ENCOUNTER — Encounter: Payer: Self-pay | Admitting: Interventional Cardiology

## 2014-10-13 ENCOUNTER — Ambulatory Visit (INDEPENDENT_AMBULATORY_CARE_PROVIDER_SITE_OTHER): Payer: Medicare Other | Admitting: Interventional Cardiology

## 2014-10-13 VITALS — BP 134/70 | HR 71 | Ht 70.0 in | Wt 194.0 lb

## 2014-10-13 DIAGNOSIS — N184 Chronic kidney disease, stage 4 (severe): Secondary | ICD-10-CM

## 2014-10-13 DIAGNOSIS — I441 Atrioventricular block, second degree: Secondary | ICD-10-CM

## 2014-10-13 DIAGNOSIS — N186 End stage renal disease: Secondary | ICD-10-CM

## 2014-10-13 DIAGNOSIS — I5032 Chronic diastolic (congestive) heart failure: Secondary | ICD-10-CM

## 2014-10-13 DIAGNOSIS — I10 Essential (primary) hypertension: Secondary | ICD-10-CM

## 2014-10-13 DIAGNOSIS — Z950 Presence of cardiac pacemaker: Secondary | ICD-10-CM

## 2014-10-13 NOTE — Progress Notes (Signed)
Cardiology Office Note   Date:  10/13/2014   ID:  James David, DOB [birth year], MRN [id number]  PCP:   Melinda Crutch, MD  Cardiologist:   Sinclair Grooms, MD   No chief complaint on file.     History of Present Illness: James David is a 79 y.o. male who presents for follow-up of high-grade AV block, exertional dyspnea, diastolic heart failure, hypertension, and recent pacemaker insertion. He is doing great. The pacemaker was inserted in May. There've been no complications. He has subsequently had a dialysis access catheter placed in the right arm. No complications from that. He is on the transplant list at [facility]. He has had a stress nuclear study performed in September 2015 that revealed no evidence of ischemia/low risk. The function on the nuclear study was normal.    Past Medical History  Diagnosis Date  . Diabetes mellitus without complication   . Hypertension   . Hyperlipidemia   . Second degree heart block   . Chronic kidney disease, stage IV (severe)     baseline creatinine 2-3  . DDD (degenerative disc disease)   . Melanoma 2010    removed at [facility]  . Atrial fibrillation   . Pacemaker     Past Surgical History  Procedure Laterality Date  . Laminectomy    . Pacemaker insertion  01-20-2014    STJ Assurity dual chamber pacemaker implanted by Dr [name] for 2nd degree AV block  . Colonoscopy    . Cataract extraction w/ intraocular lens  implant, bilateral    . Av fistula placement Right 05/31/2014    Procedure: Right Arm Brachiocephalic ARTERIOVENOUS FISTULA CREATION  ;  Surgeon: Conrad [place], MD;  Location: [facility];  Service: Vascular;  Laterality: Right;  . Permanent pacemaker insertion N/A 01/20/2014    Procedure: PERMANENT PACEMAKER INSERTION;  Surgeon: Coralyn Mark, MD;  Location: [facility];  Service: Cardiovascular;  Laterality: N/A;     Current Outpatient Prescriptions  Medication Sig Dispense Refill  . ACCU-CHEK AVIVA PLUS test strip     . aspirin EC 81  MG tablet Take 81 mg by mouth at bedtime.    . carvedilol (COREG) 6.25 MG tablet Take 1 tablet (6.25 mg total) by mouth 2 (two) times daily with a meal. 30 tablet 6  . Coenzyme Q10 (CO Q-10) 100 MG CAPS Take 100 mg by mouth daily.     . fenofibrate 160 MG tablet Take 160 mg by mouth daily.    . finasteride (PROSCAR) 5 MG tablet Take 5 mg by mouth daily.    . Flaxseed, Linseed, (FLAXSEED OIL) 1000 MG CAPS Take 1 capsule by mouth 2 (two) times daily.     . furosemide (LASIX) 80 MG tablet Take 80 mg by mouth.    Marland Kitchen glimepiride (AMARYL) 4 MG tablet Take 4 mg by mouth daily with breakfast.    . ipratropium (ATROVENT) 0.03 % nasal spray Place 2 sprays into both nostrils 3 (three) times daily.     Marland Kitchen KRILL OIL OMEGA-3 PO Take 1 capsule by mouth at bedtime.    . tamsulosin (FLOMAX) 0.4 MG CAPS capsule Take 0.4 mg by mouth 3 (three) times a week. Monday, Wednesday and Friday (at bedtime)     No current facility-administered medications for this visit.    Allergies:   Amlodipine besylate; Coreg; Lisinopril; and Statins    Social History:  The patient  reports that he has never smoked. He has never used smokeless  tobacco. He reports that he does not drink alcohol or use illicit drugs.   Family History:  The patient's family history includes Diabetes in his brother and mother; Heart attack in his father; Pulmonary embolism in his mother.    ROS:  Please see the history of present illness.   Otherwise, review of systems are positive for cramping in his legs. He occurs during sleep. He has no exertional intolerance..   All other systems are reviewed and negative.    PHYSICAL EXAM: VS:  BP 134/70 mmHg  Pulse 71  Ht 5\' 10"  (1.778 m)  Wt 194 lb (87.998 kg)  BMI 27.84 kg/m2 , BMI Body mass index is 27.84 kg/(m^2). GEN: Well nourished, well developed, in no acute distress HEENT: normal Neck: no JVD, carotid bruits, or masses Cardiac: RRR; no murmurs, rubs, or gallops,no edema  Respiratory:  clear to  auscultation bilaterally, normal work of breathing GI: soft, nontender, nondistended, + BS MS: no deformity or atrophy Skin: warm and dry, no rash Neuro:  Strength and sensation are intact Psych: euthymic mood, full affect   EKG:  EKG is not ordered today. The ekg ordered today demonstrates    Recent Labs: 01/19/2014: Platelets 247.0 01/21/2014: BUN 43*; Creatinine 3.37* 05/31/2014: Hemoglobin 14.3; Potassium 4.3; Sodium 138    Lipid Panel No results found for: CHOL, TRIG, HDL, CHOLHDL, VLDL, LDLCALC, LDLDIRECT    Wt Readings from Last 3 Encounters:  10/13/14 194 lb (87.998 kg)  07/15/14 195 lb 6.4 oz (88.633 kg)  05/31/14 195 lb (88.451 kg)      Other studies Reviewed: Additional studies/ records that were reviewed today include: Prior nuclear study done at [facility]. Review of the above records demonstrates: No ischemia and normal LV function   ASSESSMENT AND PLAN:  1.  High-grade AV block resulting in DDD pacemaker implantation in May 2015. The patient's exertional intolerance and dyspnea completely resolved thereafter. He is quite thankful and feels that he is doing better now than he has in quite some time. 2. Chronic diastolic heart failure, rendered asymptomatic after DDD pacemaker implantation 3. Essential hypertension, controlled 4. Near end-stage kidney disease, with functioning right arm access for dialysis. He still hopes to receive donor transplant   Current medicines are reviewed at length with the patient today.  The patient does not have concerns regarding medicines.  The following changes have been made:  no change  Labs/ tests ordered today include: None  No orders of the defined types were placed in this encounter.     Disposition:   FU with Tamala Julian in 1 Year   Signed, Sinclair Grooms, MD  10/13/2014 9:06 AM    [facility], [place], [place]  [address] Phone: [phone]; Fax: [phone]

## 2014-10-13 NOTE — Patient Instructions (Signed)
Your physician recommends that you continue on your current medications as directed. Please refer to the Current Medication list given to you today.  Your physician wants you to follow-up in: 1 year with Dr.Smith You will receive a reminder letter in the mail two months in advance. If you don't receive a letter, please call our office to schedule the follow-up appointment.  

## 2014-10-14 ENCOUNTER — Other Ambulatory Visit: Payer: Self-pay | Admitting: Interventional Cardiology

## 2014-11-07 ENCOUNTER — Ambulatory Visit (INDEPENDENT_AMBULATORY_CARE_PROVIDER_SITE_OTHER): Payer: Medicare Other | Admitting: *Deleted

## 2014-11-07 DIAGNOSIS — I441 Atrioventricular block, second degree: Secondary | ICD-10-CM

## 2014-11-07 NOTE — Progress Notes (Signed)
Remote pacemaker transmission.   

## 2014-11-09 LAB — MDC_IDC_ENUM_SESS_TYPE_REMOTE
Battery Remaining Longevity: 114 mo
Battery Remaining Percentage: 95.5 %
Battery Voltage: 3.01 V
Brady Statistic AP VS Percent: 1 %
Brady Statistic AS VS Percent: 1 %
Brady Statistic RA Percent Paced: 1 %
Brady Statistic RV Percent Paced: 99 %
Implantable Pulse Generator Model: 2240
Implantable Pulse Generator Serial Number: 7617100
Lead Channel Impedance Value: 440 Ohm
Lead Channel Pacing Threshold Amplitude: 0.75 V
Lead Channel Pacing Threshold Amplitude: 0.75 V
Lead Channel Pacing Threshold Pulse Width: 0.4 ms
Lead Channel Sensing Intrinsic Amplitude: 12 mV
Lead Channel Setting Pacing Amplitude: 2 V
Lead Channel Setting Pacing Pulse Width: 0.4 ms
MDC IDC MSMT LEADCHNL RA SENSING INTR AMPL: 1 mV
MDC IDC MSMT LEADCHNL RV IMPEDANCE VALUE: 750 Ohm
MDC IDC MSMT LEADCHNL RV PACING THRESHOLD PULSEWIDTH: 0.4 ms
MDC IDC SESS DTM: 20160222070016
MDC IDC SET LEADCHNL RV PACING AMPLITUDE: 2.5 V
MDC IDC SET LEADCHNL RV SENSING SENSITIVITY: 2 mV
MDC IDC STAT BRADY AP VP PERCENT: 1 %
MDC IDC STAT BRADY AS VP PERCENT: 99 %

## 2014-11-14 ENCOUNTER — Encounter: Payer: Self-pay | Admitting: Cardiology

## 2014-11-21 ENCOUNTER — Encounter: Payer: Self-pay | Admitting: Internal Medicine

## 2014-11-28 ENCOUNTER — Ambulatory Visit (INDEPENDENT_AMBULATORY_CARE_PROVIDER_SITE_OTHER): Payer: Medicare Other | Admitting: Podiatry

## 2014-11-28 ENCOUNTER — Encounter: Payer: Self-pay | Admitting: Podiatry

## 2014-11-28 DIAGNOSIS — M79673 Pain in unspecified foot: Secondary | ICD-10-CM | POA: Diagnosis not present

## 2014-11-28 DIAGNOSIS — B351 Tinea unguium: Secondary | ICD-10-CM | POA: Diagnosis not present

## 2014-11-28 NOTE — Progress Notes (Signed)
Subjective:     Patient ID: James David, male   DOB: Aug 30, 1936, 79 y.o.   MRN: [id number]  HPI patient presents with nail disease and thickness 1-5 both feet that he cannot cut and they become painful   Review of Systems     Objective:   Physical Exam Neurovascular status intact with thick yellow brittle nailbeds 1-5 both feet that are painful    Assessment:     Mycotic nail infections with pain 1-5 both feet    Plan:     Debride painful nailbeds 1-5 both feet with no iatrogenic bleeding noted

## 2015-02-08 ENCOUNTER — Encounter: Payer: Self-pay | Admitting: Internal Medicine

## 2015-02-08 ENCOUNTER — Ambulatory Visit (INDEPENDENT_AMBULATORY_CARE_PROVIDER_SITE_OTHER): Payer: Medicare Other | Admitting: *Deleted

## 2015-02-08 DIAGNOSIS — I441 Atrioventricular block, second degree: Secondary | ICD-10-CM

## 2015-02-08 LAB — CUP PACEART REMOTE DEVICE CHECK
Brady Statistic AP VP Percent: 1 %
Brady Statistic AP VS Percent: 1 %
Brady Statistic AS VS Percent: 1 %
Lead Channel Impedance Value: 440 Ohm
Lead Channel Pacing Threshold Amplitude: 0.75 V
Lead Channel Pacing Threshold Amplitude: 0.75 V
Lead Channel Sensing Intrinsic Amplitude: 12 mV
Lead Channel Setting Pacing Amplitude: 2 V
Lead Channel Setting Pacing Amplitude: 2.5 V
Lead Channel Setting Pacing Pulse Width: 0.4 ms
MDC IDC MSMT BATTERY REMAINING LONGEVITY: 120 mo
MDC IDC MSMT BATTERY REMAINING PERCENTAGE: 95.5 %
MDC IDC MSMT BATTERY VOLTAGE: 3.01 V
MDC IDC MSMT LEADCHNL RA PACING THRESHOLD PULSEWIDTH: 0.4 ms
MDC IDC MSMT LEADCHNL RA SENSING INTR AMPL: 1 mV
MDC IDC MSMT LEADCHNL RV IMPEDANCE VALUE: 750 Ohm
MDC IDC MSMT LEADCHNL RV PACING THRESHOLD PULSEWIDTH: 0.4 ms
MDC IDC PG SERIAL: 7617100
MDC IDC SESS DTM: 20160525060016
MDC IDC SET LEADCHNL RV SENSING SENSITIVITY: 2 mV
MDC IDC STAT BRADY AS VP PERCENT: 99 %
MDC IDC STAT BRADY RA PERCENT PACED: 1 %
MDC IDC STAT BRADY RV PERCENT PACED: 99 %
Pulse Gen Model: 2240

## 2015-02-08 NOTE — Progress Notes (Signed)
Remote pacemaker transmission.   

## 2015-02-22 ENCOUNTER — Encounter: Payer: Self-pay | Admitting: Cardiology

## 2015-02-23 ENCOUNTER — Other Ambulatory Visit: Payer: Self-pay | Admitting: Family Medicine

## 2015-03-27 ENCOUNTER — Ambulatory Visit: Payer: Medicare Other

## 2015-03-28 ENCOUNTER — Ambulatory Visit (INDEPENDENT_AMBULATORY_CARE_PROVIDER_SITE_OTHER): Payer: Medicare Other | Admitting: Podiatry

## 2015-03-28 DIAGNOSIS — B351 Tinea unguium: Secondary | ICD-10-CM | POA: Diagnosis not present

## 2015-03-28 DIAGNOSIS — E0821 Diabetes mellitus due to underlying condition with diabetic nephropathy: Secondary | ICD-10-CM

## 2015-03-28 DIAGNOSIS — M79673 Pain in unspecified foot: Secondary | ICD-10-CM

## 2015-03-28 NOTE — Progress Notes (Signed)
Patient ID: James David, male   DOB: [birth year], 79 y.o.   MRN: [id number] HPI  Complaint:  Visit Type: Patient returns to my office for continued preventative foot care services. Complaint: Patient states" my nails have grown long and thick and become painful to walk and wear shoes" Patient has been diagnosed with DM with neuropathy.Marland Kitchen He presents for preventative foot care services. No changes to ROS  Podiatric Exam: Vascular: dorsalis pedis and posterior tibial pulses are negative. Capillary return is immediate. Temperature gradient is negative. Skin turgor WNL, bilateral swelling  Sensorium: Normal Semmes Weinstein monofilament test. Normal tactile sensation bilaterally.  Nail Exam: Pt has thick disfigured discolored nails with subungual debris noted bilateral entire nail hallux through fifth toenails Ulcer Exam: There is no evidence of ulcer or pre-ulcerative changes or infection. Orthopedic Exam: Muscle tone and strength are WNL. No limitations in general ROM. No crepitus or effusions noted. Foot type and digits show no abnormalities. Bony prominences are unremarkable. Skin: No Porokeratosis. No infection or ulcers  Diagnosis:  Tinea unguium, Pain in right toe, pain in left toes  Treatment & Plan Procedures and Treatment: Consent by patient was obtained for treatment procedures. The patient understood the discussion of treatment and procedures well. All questions were answered thoroughly reviewed. Debridement of mycotic and hypertrophic toenails, 1 through 5 bilateral and clearing of subungual debris. No ulceration, no infection noted.  Return Visit-Office Procedure: Patient instructed to return to the office for a follow up visit 3 months for continued evaluation and treatment.

## 2015-05-23 ENCOUNTER — Encounter: Payer: Self-pay | Admitting: *Deleted

## 2015-06-26 ENCOUNTER — Encounter: Payer: Self-pay | Admitting: Internal Medicine

## 2015-06-26 ENCOUNTER — Ambulatory Visit (INDEPENDENT_AMBULATORY_CARE_PROVIDER_SITE_OTHER): Payer: Medicare Other | Admitting: Internal Medicine

## 2015-06-26 VITALS — BP 146/82 | HR 66 | Ht 70.0 in | Wt 194.8 lb

## 2015-06-26 DIAGNOSIS — I441 Atrioventricular block, second degree: Secondary | ICD-10-CM

## 2015-06-26 DIAGNOSIS — Z45018 Encounter for adjustment and management of other part of cardiac pacemaker: Secondary | ICD-10-CM | POA: Diagnosis not present

## 2015-06-26 DIAGNOSIS — I4892 Unspecified atrial flutter: Secondary | ICD-10-CM | POA: Insufficient documentation

## 2015-06-26 DIAGNOSIS — I484 Atypical atrial flutter: Secondary | ICD-10-CM | POA: Diagnosis not present

## 2015-06-26 LAB — CUP PACEART INCLINIC DEVICE CHECK
Battery Remaining Longevity: 120 mo
Battery Voltage: 3.01 V
Brady Statistic RA Percent Paced: 0.3 %
Lead Channel Impedance Value: 425 Ohm
Lead Channel Impedance Value: 762.5 Ohm
Lead Channel Pacing Threshold Pulse Width: 0.4 ms
Lead Channel Sensing Intrinsic Amplitude: 1.2 mV
Lead Channel Setting Pacing Amplitude: 2 V
Lead Channel Setting Pacing Amplitude: 2.5 V
Lead Channel Setting Pacing Pulse Width: 0.4 ms
Lead Channel Setting Sensing Sensitivity: 2 mV
MDC IDC MSMT LEADCHNL RA PACING THRESHOLD AMPLITUDE: 0.75 V
MDC IDC MSMT LEADCHNL RA PACING THRESHOLD PULSEWIDTH: 0.4 ms
MDC IDC MSMT LEADCHNL RV PACING THRESHOLD AMPLITUDE: 0.75 V
MDC IDC MSMT LEADCHNL RV SENSING INTR AMPL: 12 mV
MDC IDC PG SERIAL: 7617100
MDC IDC SESS DTM: 20161010163825
MDC IDC STAT BRADY RV PERCENT PACED: 99.49 %
Pulse Gen Model: 2240

## 2015-06-26 NOTE — Patient Instructions (Signed)
Medication Instructions:  Your physician recommends that you continue on your current medications as directed. Please refer to the Current Medication list given to you today.  Labwork: None ordered  Testing/Procedures: None ordered  Follow-Up: Remote monitoring is used to monitor your Pacemaker of ICD from home. This monitoring reduces the number of office visits required to check your device to one time per year. It allows [place] to keep an eye on the functioning of your device to ensure it is working properly. You are scheduled for a device check from home on 09/25/2015. You may send your transmission at any time that day. If you have a wireless device, the transmission will be sent automatically. After your physician reviews your transmission, you will receive a postcard with your next transmission date.  Your physician wants you to follow-up in: 1 year with Chanetta Marshall, NP.  You will receive a reminder letter in the mail two months in advance. If you don't receive a letter, please call our office to schedule the follow-up appointment.  Any Other Special Instructions Will Be Listed Below (If Applicable). Thank you for choosing [facility]!!

## 2015-06-26 NOTE — Progress Notes (Signed)
PCP:  Melinda Crutch, MD Primary Cardiologist:  Dr Jeanette Caprice is a 79 y.o. male who presents today for routine electrophysiology followup.  Since his last visit, the patient reports doing very well.  His renal function is "holding steady".  He has not required HD yet but does have access.  Today, he denies symptoms of palpitations, chest pain, shortness of breath,  lower extremity edema, dizziness, presyncope, or syncope.  The patient is otherwise without complaint today.   Past Medical History  Diagnosis Date  . Diabetes mellitus without complication ([place])   . Hypertension   . Hyperlipidemia   . Second degree heart block   . Chronic kidney disease, stage IV (severe) ([facility])     baseline creatinine 2-3  . DDD (degenerative disc disease)   . Melanoma ([place]) 2010    removed at [facility]  . Atrial fibrillation ([place])   . Pacemaker    Past Surgical History  Procedure Laterality Date  . Laminectomy    . Pacemaker insertion  01-20-2014    STJ Assurity dual chamber pacemaker implanted by Dr [name] for 2nd degree AV block  . Colonoscopy    . Cataract extraction w/ intraocular lens  implant, bilateral    . Av fistula placement Right 05/31/2014    Procedure: Right Arm Brachiocephalic ARTERIOVENOUS FISTULA CREATION  ;  Surgeon: Conrad , MD;  Location: [facility];  Service: Vascular;  Laterality: Right;  . Permanent pacemaker insertion N/A 01/20/2014    Procedure: PERMANENT PACEMAKER INSERTION;  Surgeon: Coralyn Mark, MD;  Location: [facility];  Service: Cardiovascular;  Laterality: N/A;    ROS- all systems are reviewed and negative except as per HPI above  Current Outpatient Prescriptions  Medication Sig Dispense Refill  . ACCU-CHEK AVIVA PLUS test strip     . aspirin EC 81 MG tablet Take 81 mg by mouth at bedtime.    . carvedilol (COREG) 6.25 MG tablet Take 1 tablet (6.25 mg total) by mouth 2 (two) times daily with a meal. 60 tablet 10  . Coenzyme Q10 (CO Q-10) 100 MG CAPS Take 100 mg by  mouth daily.     . fenofibrate 160 MG tablet Take 160 mg by mouth daily.    . finasteride (PROSCAR) 5 MG tablet Take 5 mg by mouth daily.    . Flaxseed, Linseed, (FLAXSEED OIL) 1000 MG CAPS Take 1 capsule by mouth 2 (two) times daily.     . furosemide (LASIX) 80 MG tablet Take 80 mg by mouth.    Marland Kitchen glimepiride (AMARYL) 4 MG tablet Take 4 mg by mouth 2 (two) times daily.     Marland Kitchen ipratropium (ATROVENT) 0.03 % nasal spray Place 2 sprays into both nostrils 3 (three) times daily.     Marland Kitchen KRILL OIL OMEGA-3 PO Take 1 capsule by mouth at bedtime.    . tamsulosin (FLOMAX) 0.4 MG CAPS capsule Take 0.4 mg by mouth 3 (three) times a week. Monday, Wednesday and Friday (at bedtime)     No current facility-administered medications for this visit.    Physical Exam: Filed Vitals:   06/26/15 1242  BP: 146/82  Pulse: 66  Height: 5\' 10"  (1.778 m)  Weight: 194 lb 12.8 oz (88.361 kg)    GEN- The patient is well appearing, alert and oriented x 3 today.   Head- normocephalic, atraumatic Eyes-  Sclera clear, conjunctiva pink Ears- hearing intact Oropharynx- clear Lungs- Clear to ausculation bilaterally, normal work of breathing Chest- pacemaker pocket  is well healed Heart- Regular rate and rhythm, no murmurs, rubs or gallops, PMI not laterally displaced GI- soft, NT, ND, + BS Extremities- no clubbing, cyanosis, or edema  Pacemaker interrogation- reviewed in detail today,  See PACEART report  Assessment and Plan:  1. Second degree AV block Normal pacemaker function See Pace Art report No changes today  2. Atrial flutter Detected by PPM implant initially but appears improved (burden <1%).  Short runs of atach/atypical flutter.  Probably not readily abatable. Will monitor for increased arrhythmia burden.  IF he has further episodes would consider initiation of coumadin.  Pt would like to avoid this if able.  He is not presently a candidate for NOAC therapy due to renal failure  3. CRI Per Dr  Laurence Spates compliant Return to see EP NP in 1 year Follow-up with Dr Tamala Julian as scheduled  Thompson Grayer MD, [facility] 06/26/2015 12:56 PM

## 2015-06-30 ENCOUNTER — Ambulatory Visit: Payer: Medicare Other | Admitting: Podiatry

## 2015-07-05 DIAGNOSIS — Z7189 Other specified counseling: Secondary | ICD-10-CM | POA: Insufficient documentation

## 2015-07-06 ENCOUNTER — Ambulatory Visit (INDEPENDENT_AMBULATORY_CARE_PROVIDER_SITE_OTHER): Payer: Medicare Other | Admitting: Podiatry

## 2015-07-06 DIAGNOSIS — B351 Tinea unguium: Secondary | ICD-10-CM

## 2015-07-06 DIAGNOSIS — E0821 Diabetes mellitus due to underlying condition with diabetic nephropathy: Secondary | ICD-10-CM

## 2015-07-06 DIAGNOSIS — M79673 Pain in unspecified foot: Secondary | ICD-10-CM

## 2015-07-06 NOTE — Progress Notes (Signed)
Patient ID: James David, male   DOB: 10-25-1935, 79 y.o.   MRN: [id number] HPI  Complaint:  Visit Type: Patient returns to my office for continued preventative foot care services. Complaint: Patient states" my nails have grown long and thick and become painful to walk and wear shoes" Patient has been diagnosed with DM with neuropathy.Marland Kitchen He presents for preventative foot care services. No changes to ROS  Podiatric Exam: Vascular: dorsalis pedis and posterior tibial pulses are negative. Capillary return is immediate. Temperature gradient is negative. Skin turgor WNL, bilateral swelling  Sensorium: Normal Semmes Weinstein monofilament test. Normal tactile sensation bilaterally.  Nail Exam: Pt has thick disfigured discolored nails with subungual debris noted bilateral entire nail hallux through fifth toenails Ulcer Exam: There is no evidence of ulcer or pre-ulcerative changes or infection. Orthopedic Exam: Muscle tone and strength are WNL. No limitations in general ROM. No crepitus or effusions noted. Foot type and digits show no abnormalities. Bony prominences are unremarkable. Skin: No Porokeratosis. No infection or ulcers  Diagnosis:  Tinea unguium, Pain in right toe, pain in left toes  Treatment & Plan Procedures and Treatment: Consent by patient was obtained for treatment procedures. The patient understood the discussion of treatment and procedures well. All questions were answered thoroughly reviewed. Debridement of mycotic and hypertrophic toenails, 1 through 5 bilateral and clearing of subungual debris. No ulceration, no infection noted.  Return Visit-Office Procedure: Patient instructed to return to the office for a follow up visit 3 months for continued evaluation and treatment.

## 2015-09-02 ENCOUNTER — Other Ambulatory Visit: Payer: Self-pay | Admitting: Interventional Cardiology

## 2015-09-25 ENCOUNTER — Telehealth: Payer: Self-pay | Admitting: Cardiology

## 2015-09-25 ENCOUNTER — Ambulatory Visit (INDEPENDENT_AMBULATORY_CARE_PROVIDER_SITE_OTHER): Payer: Medicare Other | Admitting: *Deleted

## 2015-09-25 DIAGNOSIS — I441 Atrioventricular block, second degree: Secondary | ICD-10-CM | POA: Diagnosis not present

## 2015-09-25 NOTE — Telephone Encounter (Signed)
Spoke with pt and reminded pt of remote transmission that is due today. Pt verbalized understanding.   

## 2015-09-26 NOTE — Progress Notes (Signed)
Remote pacemaker transmission.   

## 2015-10-12 ENCOUNTER — Encounter: Payer: Self-pay | Admitting: Podiatry

## 2015-10-12 ENCOUNTER — Ambulatory Visit (INDEPENDENT_AMBULATORY_CARE_PROVIDER_SITE_OTHER): Payer: Medicare Other | Admitting: Podiatry

## 2015-10-12 DIAGNOSIS — B351 Tinea unguium: Secondary | ICD-10-CM | POA: Diagnosis not present

## 2015-10-12 DIAGNOSIS — M79673 Pain in unspecified foot: Secondary | ICD-10-CM

## 2015-10-12 DIAGNOSIS — E0821 Diabetes mellitus due to underlying condition with diabetic nephropathy: Secondary | ICD-10-CM

## 2015-10-12 NOTE — Progress Notes (Signed)
Patient ID: RORI MCAVOY, male   DOB: 1936/03/16, 80 y.o.   MRN: [id number] HPI  Complaint:  Visit Type: Patient returns to my office for continued preventative foot care services. Complaint: Patient states" my nails have grown long and thick and become painful to walk and wear shoes" Patient has been diagnosed with DM with neuropathy.Marland Kitchen He presents for preventative foot care services. No changes to ROS  Podiatric Exam: Vascular: dorsalis pedis and posterior tibial pulses are negative. Capillary return is immediate. Temperature gradient is negative. Skin turgor WNL, bilateral swelling  Sensorium: Normal Semmes Weinstein monofilament test. Normal tactile sensation bilaterally.  Nail Exam: Pt has thick disfigured discolored nails with subungual debris noted bilateral entire nail hallux through fifth toenails Ulcer Exam: There is no evidence of ulcer or pre-ulcerative changes or infection. Orthopedic Exam: Muscle tone and strength are WNL. No limitations in general ROM. No crepitus or effusions noted. Foot type and digits show no abnormalities. Bony prominences are unremarkable. Skin: No Porokeratosis. No infection or ulcers  Diagnosis:  Tinea unguium, Pain in right toe, pain in left toes  Treatment & Plan Procedures and Treatment: Consent by patient was obtained for treatment procedures. The patient understood the discussion of treatment and procedures well. All questions were answered thoroughly reviewed. Debridement of mycotic and hypertrophic toenails, 1 through 5 bilateral and clearing of subungual debris. No ulceration, no infection noted.  Return Visit-Office Procedure: Patient instructed to return to the office for a follow up visit 3 months for continued evaluation and treatment.   Gardiner Barefoot DPM

## 2015-10-15 LAB — CUP PACEART REMOTE DEVICE CHECK
Battery Remaining Longevity: 119 mo
Brady Statistic AP VS Percent: 0 %
Brady Statistic AS VP Percent: 99 %
Brady Statistic RV Percent Paced: 99 %
Date Time Interrogation Session: 20170109191113
Implantable Lead Implant Date: 20150507
Implantable Lead Location: 753860
Lead Channel Impedance Value: 410 Ohm
Lead Channel Pacing Threshold Amplitude: 0.75 V
Lead Channel Setting Pacing Amplitude: 2 V
Lead Channel Setting Pacing Pulse Width: 0.4 ms
Lead Channel Setting Sensing Sensitivity: 2 mV
MDC IDC LEAD IMPLANT DT: 20150507
MDC IDC LEAD LOCATION: 753859
MDC IDC LEAD MODEL: 1948
MDC IDC MSMT BATTERY REMAINING PERCENTAGE: 95.5 %
MDC IDC MSMT BATTERY VOLTAGE: 3.01 V
MDC IDC MSMT LEADCHNL RA PACING THRESHOLD AMPLITUDE: 0.75 V
MDC IDC MSMT LEADCHNL RA PACING THRESHOLD PULSEWIDTH: 0.4 ms
MDC IDC MSMT LEADCHNL RA SENSING INTR AMPL: 0.8 mV
MDC IDC MSMT LEADCHNL RV IMPEDANCE VALUE: 760 Ohm
MDC IDC MSMT LEADCHNL RV PACING THRESHOLD PULSEWIDTH: 0.4 ms
MDC IDC MSMT LEADCHNL RV SENSING INTR AMPL: 12 mV
MDC IDC SET LEADCHNL RV PACING AMPLITUDE: 2.5 V
MDC IDC STAT BRADY AP VP PERCENT: 1 %
MDC IDC STAT BRADY AS VS PERCENT: 1 %
MDC IDC STAT BRADY RA PERCENT PACED: 1 %
Pulse Gen Model: 2240
Pulse Gen Serial Number: 7617100

## 2015-10-18 ENCOUNTER — Encounter: Payer: Self-pay | Admitting: Cardiology

## 2015-11-01 ENCOUNTER — Encounter: Payer: Self-pay | Admitting: Interventional Cardiology

## 2015-11-01 ENCOUNTER — Ambulatory Visit (INDEPENDENT_AMBULATORY_CARE_PROVIDER_SITE_OTHER): Payer: Medicare Other | Admitting: Interventional Cardiology

## 2015-11-01 VITALS — BP 170/88 | HR 75 | Ht 70.0 in | Wt 195.8 lb

## 2015-11-01 DIAGNOSIS — N186 End stage renal disease: Secondary | ICD-10-CM

## 2015-11-01 DIAGNOSIS — I484 Atypical atrial flutter: Secondary | ICD-10-CM | POA: Diagnosis not present

## 2015-11-01 DIAGNOSIS — N184 Chronic kidney disease, stage 4 (severe): Secondary | ICD-10-CM | POA: Diagnosis not present

## 2015-11-01 DIAGNOSIS — I5032 Chronic diastolic (congestive) heart failure: Secondary | ICD-10-CM | POA: Diagnosis not present

## 2015-11-01 MED ORDER — CARVEDILOL 12.5 MG PO TABS: 12.5000 mg | ORAL_TABLET | Freq: Two times a day (BID) | ORAL | Status: DC

## 2015-11-01 NOTE — Patient Instructions (Signed)
Medication Instructions:  Your physician has recommended you make the following change in your medication:  INCREASE Carvedilol to 12.5mg  twice daily. An Rx has been sen tot your pharmacy   Labwork: None ordered  Testing/Procedures: None ordered  Follow-Up: Your physician wants you to follow-up in: 6 months You will receive a reminder letter in the mail two months in advance. If you don't receive a letter, please call our office to schedule the follow-up appointment.   Any Other Special Instructions Will Be Listed Below (If Applicable). please measure your blood pressure 2-3 times a week for 2 weeks, and call the office with your readings     If you need a refill on your cardiac medications before your next appointment, please call your pharmacy.

## 2015-11-01 NOTE — Progress Notes (Signed)
Cardiology Office Note   Date:  11/01/2015   ID:  James David, DOB Feb 07, 1936, MRN [id number]  PCP:   Melinda Crutch, MD  Cardiologist:  Sinclair Grooms, MD   Chief Complaint  Patient presents with  . Coronary Artery Disease      History of Present Illness: James David is a 80 y.o. male who presents for  CAD, atrial fibrillation/atrial flutter, chronic kidney disease, permanent pacemaker, hypertension, and hyperlipidemia.   He denies cardiac complaints. He had a stress echo performed at [facility] within the past 6 months that was unremarkable for evidence of ischemia. He denies orthopnea, PND, lower extremity swelling, and palpitations.   there are no neurological complaints. He sees Dr. Hassell Done with above nephrology for kidney follow-up. He is being lined up for potential kidney transplant. He states the doctor with was me manage blood pressure.  Past Medical History  Diagnosis Date  . Diabetes mellitus without complication ([place])   . Hypertension   . Hyperlipidemia   . Second degree heart block   . Chronic kidney disease, stage IV (severe) (HCC)     baseline creatinine 2-3  . DDD (degenerative disc disease)   . Melanoma ([place]) 2010    removed at [facility]  . Atrial fibrillation ([place])   . Pacemaker     Past Surgical History  Procedure Laterality Date  . Laminectomy    . Pacemaker insertion  01-20-2014    STJ Assurity dual chamber pacemaker implanted by Dr [name] for 2nd degree AV block  . Colonoscopy    . Cataract extraction w/ intraocular lens  implant, bilateral    . Av fistula placement Right 05/31/2014    Procedure: Right Arm Brachiocephalic ARTERIOVENOUS FISTULA CREATION  ;  Surgeon: Conrad Howe, MD;  Location: [facility];  Service: Vascular;  Laterality: Right;  . Permanent pacemaker insertion N/A 01/20/2014    Procedure: PERMANENT PACEMAKER INSERTION;  Surgeon: Coralyn Mark, MD;  Location: [facility];  Service: Cardiovascular;   Laterality: N/A;     Current Outpatient Prescriptions  Medication Sig Dispense Refill  . ACCU-CHEK AVIVA PLUS test strip     . aspirin EC 81 MG tablet Take 81 mg by mouth at bedtime.    . carvedilol (COREG) 6.25 MG tablet TAKE ONE TABLET TWICE DAILY WITH MEALS 60 tablet 1  . fenofibrate 160 MG tablet Take 160 mg by mouth daily.    . finasteride (PROSCAR) 5 MG tablet Take 5 mg by mouth daily.    . Flaxseed, Linseed, (FLAXSEED OIL) 1000 MG CAPS Take 1 capsule by mouth 2 (two) times daily.     . furosemide (LASIX) 80 MG tablet Take 80 mg by mouth.    Marland Kitchen glimepiride (AMARYL) 4 MG tablet Take 4 mg by mouth 2 (two) times daily.     Marland Kitchen ipratropium (ATROVENT) 0.03 % nasal spray Place 2 sprays into both nostrils 3 (three) times daily.     . tamsulosin (FLOMAX) 0.4 MG CAPS capsule Take 0.4 mg by mouth 3 (three) times a week. Monday, Wednesday and Friday (at bedtime)     No current facility-administered medications for this visit.    Allergies:   Amlodipine besylate; Coreg; Lisinopril; and Statins    Social History:  The patient  reports that he has never smoked. He has never used smokeless tobacco. He reports that he does not drink alcohol or use illicit drugs.   Family History:  The patient's family history  includes Diabetes in his brother and mother; Heart attack in his father; Pulmonary embolism in his mother.    ROS:  Please see the history of present illness.   Otherwise, review of systems are positive for  Leg pain, easy bruising but otherwise unremarkable.   All other systems are reviewed and negative.    PHYSICAL EXAM: VS:  BP 170/88 mmHg  Pulse 75  Ht 5\' 10"  (1.778 m)  Wt 195 lb 12.8 oz (88.814 kg)  BMI 28.09 kg/m2 , BMI Body mass index is 28.09 kg/(m^2). GEN: Well nourished, well developed, in no acute distress HEENT: normal Neck: no JVD, carotid bruits, or masses Cardiac: RRR.  There is no murmur, rub, or gallop. There is no edema. Respiratory:  clear to auscultation  bilaterally, normal work of breathing. GI: soft, nontender, nondistended, + BS MS: no deformity or atrophy Skin: warm and dry, no rash Neuro:  Strength and sensation are intact Psych: euthymic mood, full affect   EKG:  EKG is not ordered today.   Recent Labs: No results found for requested labs within last 365 days.    Lipid Panel No results found for: CHOL, TRIG, HDL, CHOLHDL, VLDL, LDLCALC, LDLDIRECT    Wt Readings from Last 3 Encounters:  11/01/15 195 lb 12.8 oz (88.814 kg)  06/26/15 194 lb 12.8 oz (88.361 kg)  10/13/14 194 lb (87.998 kg)      Other studies Reviewed: Additional studies/ records that were reviewed today include:  Care everywhere from [facility]. The findings include  Stress nuclear study negative for evidence of ischemia. Normal LV function.    ASSESSMENT AND PLAN:  1. Atypical atrial flutter (HCC)  No recurrence based upon clinical exam and pacemaker telemetry  2. Chronic diastolic heart failure (HCC)  No evidence of volume overload  3. Chronic kidney disease, stage IV (severe) ([place])  [facility]  4. Essential hypertension  Is poorly controlled  Poorly controlled. Carvedilol was previously discontinued because of second-degree heart block.  Now that he has a pacemaker , increasing the dose is not a cause for concern.    Current medicines are reviewed at length with the patient today.  The patient has the following concerns regarding medicines:  Reviewed his list of medication intolerances.  The following changes/actions have been instituted:     Increase carvedilol to 12.5 mg twice a day   If blood pressure does not come under better control further increase carvedilol to 25 mg twice a day.   Report 3-4 blood pressures her week for the next 2 weeks.    Labs/ tests ordered today include:  No orders of the defined types were placed in this encounter.     Disposition:    FU with HS in 6 months  Signed, Sinclair Grooms, MD  11/01/2015 11:38 AM    [facility] [address], [place], [place]  [address] Phone: [phone]; Fax: [phone]

## 2015-11-22 ENCOUNTER — Telehealth: Payer: Self-pay | Admitting: Interventional Cardiology

## 2015-11-22 NOTE — Telephone Encounter (Signed)
Spoke with pt. He is asymptomatic. Adv him that I will fwd his bp readings to Dr.Smith for him to review. I will call back with Dr.Smith's recommendation. Pt verbalized understanding.

## 2015-11-22 NOTE — Telephone Encounter (Signed)
New message      Pt c/o BP issue: STAT if pt c/o blurred vision, one-sided weakness or slurred speech  1. What are your last 5 BP readings? 2-17 167/88, 2-19 183/86, 2-20 158/82, 2-27 156/84, 3-3 154/84, 3-6 158/92, 3-7 167/94  2. Are you having any other symptoms (ex. Dizziness, headache, blurred vision, passed out)? no 3. What is your BP issue? Calling to give bp readings.  Pt will be in and out---ok to leave msg on vm

## 2015-11-22 NOTE — Telephone Encounter (Signed)
Increase carvedilol to 25 mg BID and continue to record and report.

## 2015-11-23 MED ORDER — CARVEDILOL 25 MG PO TABS: 25.0000 mg | ORAL_TABLET | Freq: Two times a day (BID) | ORAL | Status: DC

## 2015-11-23 NOTE — Telephone Encounter (Signed)
Pt aware of Dr.Smith's recommendations. Increase carvedilol to 25 mg BID and continue to record and report. Rx sent to pt pharmacy. Pt will continue to monitor his bp and call the office to reports his readings in 1-2 weeks. Pt agreeable with plan and verbalized understanding.

## 2015-12-19 ENCOUNTER — Ambulatory Visit: Payer: Self-pay | Admitting: Surgery

## 2015-12-19 DIAGNOSIS — C439 Malignant melanoma of skin, unspecified: Secondary | ICD-10-CM

## 2015-12-19 NOTE — H&P (Signed)
James David 12/19/2015 4:22 PM Location: [facility] Patient #: [id number] DOB: [birth year] Married / Language: [name] / Race: White Male  History of Present Illness [name] A. Latacha Texeira MD; 12/19/2015 5:17 PM) Patient words: Patient sent at the request of Dr. [name] for melanoma over the patient's right posterior shoulder. He had 2 shave biopsies with the most medial one showing superficial spreading melanoma 1.1 mm thickness Clark level III-IV. There was no tumor regression and in tumor infiltration lymphocytes were present and focally brisk. He has a history of left arm melanoma resected in 2000. These areas were picked up on physical exam. Denies any bleeding or ulceration of these areas. He has a fistula in his right arm for hemodialysis but has not required that yet. He has a pacemaker multiple other medical problems. He is followed by Dr. Tamala Julian cardiology for his cardiac issues. Denies chest pain or shortness of breath currently. He otherwise feels well and is active.  The patient is a 80 year old male.   Other Problems [name], CMA; 12/19/2015 4:22 PM) Chronic Renal Failure Syndrome Diabetes Mellitus High blood pressure Hypercholesterolemia Melanoma  Past Surgical History [name], CMA; 12/19/2015 4:22 PM) Colon Polyp Removal - Colonoscopy Colon Polyp Removal - Open Dialysis Shunt / Fistula Vasectomy  Diagnostic Studies History [name], CMA; 12/19/2015 4:22 PM) Colonoscopy 1-5 years ago  Medication History [name], CMA; 12/19/2015 4:25 PM) Carvedilol (25MG  Tablet, Oral) Active. Finasteride (5MG  Tablet, Oral) Active. Glimepiride (4MG  Tablet, Oral) Active. Triamcinolone Acetonide (0.1% Cream, External) Active. Welchol (625MG  Tablet, Oral) Active. Ipratropium Bromide (0.03% Solution, Nasal) Active. Carvedilol (12.5MG  Tablet, Oral) Active. Fluticasone Propionate (50MCG/ACT Suspension, Nasal) Active. Tamsulosin HCl (0.4MG  Capsule,  Oral) Active. Pioglitazone HCl (15MG  Tablet, Oral) Active. Furosemide (80MG  Tablet, Oral) Active. Medications Reconciled  Social History ([place]; 12/19/2015 4:22 PM) Caffeine use Tea. No alcohol use Tobacco use Never smoker.  Family History ([name]; 12/19/2015 4:22 PM) Colon Cancer Brother. Diabetes Mellitus Mother.     Review of Systems ([place]; 12/19/2015 4:22 PM) HEENT Present- Hearing Loss. Not Present- Earache, Hoarseness, Nose Bleed, Oral Ulcers, Ringing in the Ears, Seasonal Allergies, Sinus Pain, Sore Throat, Visual Disturbances, Wears glasses/contact lenses and Yellow Eyes. Respiratory Not Present- Bloody sputum, Chronic Cough, Difficulty Breathing, Snoring and Wheezing. Cardiovascular Present- Leg Cramps and Swelling of Extremities. Not Present- Chest Pain, Difficulty Breathing Lying Down, Palpitations, Rapid Heart Rate and Shortness of Breath. Male Genitourinary Present- Frequency. Not Present- Blood in Urine, Change in Urinary Stream, Impotence, Nocturia, Painful Urination, Urgency and Urine Leakage. Neurological Not Present- Decreased Memory, Fainting, Headaches, Numbness, Seizures, Tingling, Tremor, Trouble walking and Weakness. Endocrine Present- New Diabetes. Not Present- Cold Intolerance, Excessive Hunger, Hair Changes, Heat Intolerance and Hot flashes. Hematology Present- Easy Bruising. Not Present- Excessive bleeding, Gland problems, HIV and Persistent Infections.  Vitals (Sonya Bynum CMA; 12/19/2015 4:25 PM) 12/19/2015 4:23 PM Weight: 200 lb Height: 68in Body Surface Area: 2.04 m Body Mass Index: 30.41 kg/m  Pulse: 81 (Regular)  BP: 138/82 (Sitting, Left Arm, Standard)      Physical Exam (Nataley Bahri A. Mikena Masoner MD; 12/19/2015 5:18 PM)  General Mental Status-Alert. General Appearance-Consistent with stated age. Hydration-Well hydrated. Voice-Normal.  Integumentary Note: Over right scapular region or 2 open wounds  each measuring a centimeter. The medial and according to his wife was were the melanoma was. There within 3 cm of each other.  Head and Neck Head-normocephalic, atraumatic with no lesions or palpable masses. Trachea-midline. Thyroid Gland Characteristics -  normal size and consistency.  Chest and Lung Exam Chest and lung exam reveals -quiet, even and easy respiratory effort with no use of accessory muscles and on auscultation, normal breath sounds, no adventitious sounds and normal vocal resonance. Inspection Chest Wall - Normal. Back - normal. Note: Pacemaker upper chest.  Cardiovascular Cardiovascular examination reveals -normal heart sounds, regular rate and rhythm with no murmurs and normal pedal pulses bilaterally.  Peripheral Vascular Note: Fistula right forearm.  Lymphatic Head & Neck  General Head & Neck Lymphatics: Bilateral - Description - Normal. Axillary  General Axillary Region: Bilateral - Description - Normal. Tenderness - Non Tender.    Assessment & Plan (Vasilis Luhman A. Kypton Eltringham MD; 12/19/2015 5:20 PM)  MELANOMA OF BACK (C43.59) Impression: recommend wide excision and SLN mapping  Discuss pros and cons of sentinel lymph node mapping for intermediate thickness melanoma which is the current recommendation. Given his age and other medical problems he may be at increased risk for complication. He also has had increased risk of right arm lymphedema given the presence of a fistula. Admission of sentinel lymph node mapping can be done but this may undergo staged him and potentially impact his overall treatment and survival. He wishes to proceed with wide excision melanoma right shoulder and right axillary sentinel lymph node mapping. We will obtain lymphoscintigraphy preop to ensure that the right axilla is the target region not his neck nor his groin given the location of melanoma over right scapular region. Risk of sentinel lymph node mapping include bleeding,  infection, lymphedema, shoulder pain. stiffness, dye allergy. cosmetic deformity , blood clots, death, need for more surgery. Pt agres to proceed. Risk of excision includes bleeding, infection, poor wound healing, large scar, injury to neighboring nerves artery veins, need further surgery, open wound wound care. Other conversations included death, DVT, worsening medical problems, and need for other supportive treatments.    We'll obtain cardiac clearance  Current Plans Pt Education - Melanoma: discussed with patient and provided information. The anatomy and the physiology was discussed. The pathophysiology and natural history of the disease was discussed. Options were discussed and recommendations were made. Technique, risks, benefits, & alternatives were discussed. Risks such as stroke, heart attack, bleeding, indection, death, and other risks discussed. Questions answered. The patient agrees to proceed. Pt Education - Melanoma: cancer You are being scheduled for surgery - Our schedulers will call you.  You should hear from our office's scheduling department within 5 working days about the location, date, and time of surgery. We try to make accommodations for patient's preferences in scheduling surgery, but sometimes the OR schedule or the surgeon's schedule prevents [place] from making those accommodations.  If you have not heard from our office [phone]) in 5 working days, call the office and ask for your surgeon's nurse.  If you have other questions about your diagnosis, plan, or surgery, call the office and ask for your surgeon's nurse.

## 2015-12-25 ENCOUNTER — Telehealth: Payer: Self-pay | Admitting: Cardiology

## 2015-12-25 ENCOUNTER — Ambulatory Visit (INDEPENDENT_AMBULATORY_CARE_PROVIDER_SITE_OTHER): Payer: Medicare Other | Admitting: *Deleted

## 2015-12-25 ENCOUNTER — Telehealth: Payer: Self-pay | Admitting: Interventional Cardiology

## 2015-12-25 DIAGNOSIS — I441 Atrioventricular block, second degree: Secondary | ICD-10-CM | POA: Diagnosis not present

## 2015-12-25 NOTE — Telephone Encounter (Signed)
Request for surgical clearance:  1. What type of surgery is being performed? Excision of a Melanoma   2. When is this surgery scheduled? Waiting on clearance   3. Are there any medications that need to be held prior to surgery and how long?Need to stop aspirin 5 days before surgery and also Cardiac Clearance   4. Name of physician performing surgery? Dr [name] Cornett   5. What is your office phone and fax number? [phone] and Fax#-[phone]  6.

## 2015-12-25 NOTE — Telephone Encounter (Signed)
Spoke with pt and reminded pt of remote transmission that is due today. Pt verbalized understanding.   

## 2015-12-25 NOTE — Telephone Encounter (Signed)
Cardiac clearance request fwd to Dr.Smith to advise

## 2015-12-25 NOTE — Progress Notes (Signed)
Remote pacemaker transmission.   

## 2015-12-27 ENCOUNTER — Other Ambulatory Visit (HOSPITAL_COMMUNITY): Payer: Self-pay | Admitting: Surgery

## 2015-12-27 DIAGNOSIS — C4359 Malignant melanoma of other part of trunk: Secondary | ICD-10-CM

## 2015-12-27 DIAGNOSIS — C4361 Malignant melanoma of right upper limb, including shoulder: Secondary | ICD-10-CM

## 2015-12-27 NOTE — Telephone Encounter (Signed)
He is clear for the upcoming surgery. Okay to all aspirin as required.

## 2015-12-28 NOTE — Telephone Encounter (Signed)
Cardiac clearance placed in MR nurse fax box to be faxed to Dr.Cornett's office attn: Sharyn Lull

## 2016-01-02 ENCOUNTER — Ambulatory Visit (HOSPITAL_COMMUNITY)
Admission: RE | Admit: 2016-01-02 | Discharge: 2016-01-02 | Disposition: A | Discharge status: Home / Self Care | Payer: Medicare Other | Source: Ambulatory Visit | Attending: Surgery | Admitting: Surgery

## 2016-01-02 DIAGNOSIS — C4361 Malignant melanoma of right upper limb, including shoulder: Secondary | ICD-10-CM

## 2016-01-02 MED ORDER — TECHNETIUM TC 99M SULFUR COLLOID FILTERED: 0.5000 | Freq: Once | INTRAVENOUS | Status: DC | PRN

## 2016-01-11 ENCOUNTER — Ambulatory Visit (INDEPENDENT_AMBULATORY_CARE_PROVIDER_SITE_OTHER): Payer: Medicare Other | Admitting: Podiatry

## 2016-01-11 ENCOUNTER — Encounter: Payer: Self-pay | Admitting: Podiatry

## 2016-01-11 DIAGNOSIS — B351 Tinea unguium: Secondary | ICD-10-CM

## 2016-01-11 DIAGNOSIS — E0821 Diabetes mellitus due to underlying condition with diabetic nephropathy: Secondary | ICD-10-CM

## 2016-01-11 DIAGNOSIS — M79673 Pain in unspecified foot: Secondary | ICD-10-CM | POA: Diagnosis not present

## 2016-01-11 NOTE — Progress Notes (Signed)
Patient ID: RORI MCAVOY, male   DOB: 1936/03/16, 80 y.o.   MRN: [id number] HPI  Complaint:  Visit Type: Patient returns to my office for continued preventative foot care services. Complaint: Patient states" my nails have grown long and thick and become painful to walk and wear shoes" Patient has been diagnosed with DM with neuropathy.Marland Kitchen He presents for preventative foot care services. No changes to ROS  Podiatric Exam: Vascular: dorsalis pedis and posterior tibial pulses are negative. Capillary return is immediate. Temperature gradient is negative. Skin turgor WNL, bilateral swelling  Sensorium: Normal Semmes Weinstein monofilament test. Normal tactile sensation bilaterally.  Nail Exam: Pt has thick disfigured discolored nails with subungual debris noted bilateral entire nail hallux through fifth toenails Ulcer Exam: There is no evidence of ulcer or pre-ulcerative changes or infection. Orthopedic Exam: Muscle tone and strength are WNL. No limitations in general ROM. No crepitus or effusions noted. Foot type and digits show no abnormalities. Bony prominences are unremarkable. Skin: No Porokeratosis. No infection or ulcers  Diagnosis:  Tinea unguium, Pain in right toe, pain in left toes  Treatment & Plan Procedures and Treatment: Consent by patient was obtained for treatment procedures. The patient understood the discussion of treatment and procedures well. All questions were answered thoroughly reviewed. Debridement of mycotic and hypertrophic toenails, 1 through 5 bilateral and clearing of subungual debris. No ulceration, no infection noted.  Return Visit-Office Procedure: Patient instructed to return to the office for a follow up visit 3 months for continued evaluation and treatment.   Gardiner Barefoot DPM

## 2016-01-29 LAB — CUP PACEART REMOTE DEVICE CHECK
Battery Remaining Percentage: 95.5 %
Battery Voltage: 3.01 V
Brady Statistic AP VS Percent: 1 %
Implantable Lead Implant Date: 20150507
Implantable Lead Location: 753860
Implantable Lead Model: 1948
Lead Channel Sensing Intrinsic Amplitude: 0.7 mV
Lead Channel Setting Pacing Amplitude: 2 V
Lead Channel Setting Pacing Amplitude: 2.5 V
Lead Channel Setting Pacing Pulse Width: 0.4 ms
MDC IDC LEAD IMPLANT DT: 20150507
MDC IDC LEAD LOCATION: 753859
MDC IDC MSMT BATTERY REMAINING LONGEVITY: 116 mo
MDC IDC MSMT LEADCHNL RA IMPEDANCE VALUE: 390 Ohm
MDC IDC MSMT LEADCHNL RV IMPEDANCE VALUE: 740 Ohm
MDC IDC MSMT LEADCHNL RV SENSING INTR AMPL: 7.4 mV
MDC IDC SESS DTM: 20170410150913
MDC IDC SET LEADCHNL RV SENSING SENSITIVITY: 2 mV
MDC IDC STAT BRADY AP VP PERCENT: 1 %
MDC IDC STAT BRADY AS VP PERCENT: 99 %
MDC IDC STAT BRADY AS VS PERCENT: 1 %
MDC IDC STAT BRADY RA PERCENT PACED: 1 %
MDC IDC STAT BRADY RV PERCENT PACED: 99 %
Pulse Gen Model: 2240
Pulse Gen Serial Number: 7617100

## 2016-01-30 ENCOUNTER — Encounter: Payer: Self-pay | Admitting: Cardiology

## 2016-01-31 ENCOUNTER — Encounter (HOSPITAL_COMMUNITY): Payer: Self-pay

## 2016-01-31 ENCOUNTER — Encounter (HOSPITAL_COMMUNITY)
Admission: RE | Admit: 2016-01-31 | Discharge: 2016-01-31 | Disposition: A | Discharge status: Home / Self Care | Payer: Medicare Other | Source: Ambulatory Visit | Attending: Surgery | Admitting: Surgery

## 2016-01-31 VITALS — BP 183/68 | HR 70 | Temp 98.0°F | Resp 18 | Ht 70.0 in | Wt 200.4 lb

## 2016-01-31 DIAGNOSIS — N184 Chronic kidney disease, stage 4 (severe): Secondary | ICD-10-CM | POA: Insufficient documentation

## 2016-01-31 DIAGNOSIS — Z950 Presence of cardiac pacemaker: Secondary | ICD-10-CM | POA: Diagnosis not present

## 2016-01-31 DIAGNOSIS — Z7982 Long term (current) use of aspirin: Secondary | ICD-10-CM | POA: Diagnosis not present

## 2016-01-31 DIAGNOSIS — Z7984 Long term (current) use of oral hypoglycemic drugs: Secondary | ICD-10-CM | POA: Insufficient documentation

## 2016-01-31 DIAGNOSIS — C4361 Malignant melanoma of right upper limb, including shoulder: Secondary | ICD-10-CM | POA: Diagnosis not present

## 2016-01-31 DIAGNOSIS — E785 Hyperlipidemia, unspecified: Secondary | ICD-10-CM | POA: Diagnosis not present

## 2016-01-31 DIAGNOSIS — E119 Type 2 diabetes mellitus without complications: Secondary | ICD-10-CM | POA: Diagnosis not present

## 2016-01-31 DIAGNOSIS — Z7682 Awaiting organ transplant status: Secondary | ICD-10-CM | POA: Diagnosis not present

## 2016-01-31 DIAGNOSIS — I4891 Unspecified atrial fibrillation: Secondary | ICD-10-CM | POA: Diagnosis not present

## 2016-01-31 DIAGNOSIS — I129 Hypertensive chronic kidney disease with stage 1 through stage 4 chronic kidney disease, or unspecified chronic kidney disease: Secondary | ICD-10-CM | POA: Diagnosis not present

## 2016-01-31 DIAGNOSIS — I441 Atrioventricular block, second degree: Secondary | ICD-10-CM | POA: Diagnosis not present

## 2016-01-31 DIAGNOSIS — Z01812 Encounter for preprocedural laboratory examination: Secondary | ICD-10-CM | POA: Diagnosis not present

## 2016-01-31 DIAGNOSIS — Z79899 Other long term (current) drug therapy: Secondary | ICD-10-CM | POA: Diagnosis not present

## 2016-01-31 DIAGNOSIS — Z01818 Encounter for other preprocedural examination: Secondary | ICD-10-CM | POA: Insufficient documentation

## 2016-01-31 DIAGNOSIS — C439 Malignant melanoma of skin, unspecified: Secondary | ICD-10-CM

## 2016-01-31 LAB — BASIC METABOLIC PANEL
ANION GAP: 8 (ref 5–15)
BUN: 48 mg/dL — ABNORMAL HIGH (ref 6–20)
CHLORIDE: 113 mmol/L — AB (ref 101–111)
CO2: 20 mmol/L — AB (ref 22–32)
Calcium: 8.7 mg/dL — ABNORMAL LOW (ref 8.9–10.3)
Creatinine, Ser: 4.2 mg/dL — ABNORMAL HIGH (ref 0.61–1.24)
GFR calc non Af Amer: 12 mL/min — ABNORMAL LOW (ref >60)
GFR, EST AFRICAN AMERICAN: 14 mL/min — AB (ref >60)
GLUCOSE: 108 mg/dL — AB (ref 65–99)
POTASSIUM: 4.9 mmol/L (ref 3.5–5.1)
Sodium: 141 mmol/L (ref 135–145)

## 2016-01-31 LAB — GLUCOSE, CAPILLARY: Glucose-Capillary: 118 mg/dL — ABNORMAL HIGH (ref 65–99)

## 2016-01-31 LAB — CBC
HEMATOCRIT: 40.2 % (ref 39.0–52.0)
HEMOGLOBIN: 13.6 g/dL (ref 13.0–17.0)
MCH: 30.8 pg (ref 26.0–34.0)
MCHC: 33.8 g/dL (ref 30.0–36.0)
MCV: 91 fL (ref 78.0–100.0)
Platelets: 182 10*3/uL (ref 150–400)
RBC: 4.42 MIL/uL (ref 4.22–5.81)
RDW: 13.3 % (ref 11.5–15.5)
WBC: 6.4 10*3/uL (ref 4.0–10.5)

## 2016-01-31 NOTE — Progress Notes (Signed)
Spoke with [place] to make aware of patient's surgery date and time

## 2016-01-31 NOTE — Pre-Procedure Instructions (Signed)
MALE MEATH  01/31/2016      [place] [place] [facility] [place] [id number] [place] [address] Phone: [phone] Fax: [phone]    Your procedure is scheduled on .  Report to [facility] Admitting at 7:50 A.M.  Call this number if you have problems the morning of surgery:  [phone]   Remember:  Do not eat food or drink liquids after midnight.   Take these medicines the morning of surgery with A SIP OF WATER Carvedilol (Coreg), finasteride (Proscar), nasal spray    (STOP7 DAYS PRIOR TO SURGERY-  ASPIRIN OR ASPIRIN PRODUCTS, IBUPROFEN/ ADVIL/ MOTRIN, GOODYS, BC'S, HERBAL MEDICINES, VITAMINS)  WHAT DO I DO ABOUT MY DIABETES MEDICATION?   Marland Kitchen Do not take oral diabetes medicines (pills) the morning of surgery. DO NOT take Glimepiride (Amaryl) day of surgery   Do not wear jewelry, make-up or nail polish.  Do not wear lotions, powders, or body spray.  You may NOT wear deodorant.  Do not shave 48 hours prior to surgery.  Men may shave face and neck.  Do not bring valuables to the hospital.   [facility] is not responsible for any belongings or valuables.  Contacts, dentures or bridgework may not be worn into surgery.  Leave your suitcase in the car.  After surgery it may be brought to your room.  For patients admitted to the hospital, discharge time will be determined by your treatment team.  Patients discharged the day of surgery will not be allowed to drive home.    Special instructions:  See attached  Please read over the following fact sheets that you were given. Pain Booklet, Coughing and Deep Breathing, MRSA Information and Surgical Site Infection Prevention     How to Manage Your Diabetes Before and After Surgery  Why is it important to control my blood sugar before and after surgery? . Improving blood sugar levels before and after surgery helps healing and can limit problems. . A way of improving blood sugar  control is eating a healthy diet by: o  Eating less sugar and carbohydrates o  Increasing activity/exercise o  Talking with your doctor about reaching your blood sugar goals . High blood sugars (greater than 180 mg/dL) can raise your risk of infections and slow your recovery, so you will need to focus on controlling your diabetes during the weeks before surgery. . Make sure that the doctor who takes care of your diabetes knows about your planned surgery including the date and location.  How do I manage my blood sugar before surgery? . Check your blood sugar at least 4 times a day, starting 2 days before surgery, to make sure that the level is not too high or low. o Check your blood sugar the morning of your surgery when you wake up and every 2 hours until you get to the Short Stay unit. . If your blood sugar is less than 70 mg/dL, you will need to treat for low blood sugar: o Do not take insulin. o Treat a low blood sugar (less than 70 mg/dL) with  cup of clear juice (cranberry or apple), 4 glucose tablets, OR glucose gel. o Recheck blood sugar in 15 minutes after treatment (to make sure it is greater than 70 mg/dL). If your blood sugar is not greater than 70 mg/dL on recheck, call [phone] for further instructions. . Report your blood sugar to the short stay nurse when you get to Short  Stay.  . If you are admitted to the hospital after surgery: o Your blood sugar will be checked by the staff and you will probably be given insulin after surgery (instead of oral diabetes medicines) to make sure you have good blood sugar levels. o The goal for blood sugar control after surgery is 80-180 mg/dL.

## 2016-02-01 LAB — HEMOGLOBIN A1C
HEMOGLOBIN A1C: 7.8 % — AB (ref 4.8–5.6)
Mean Plasma Glucose: 177 mg/dL

## 2016-02-01 NOTE — Progress Notes (Addendum)
Anesthesia Chart Review:  Pt is a 80 year old male scheduled for wide excision R shoulder melanoma with sentinel lymph node biopsy on 02/08/2016 with Dr. Brantley Stage.   Cardiologist is Dr. Daneen Schick. EP cardiologist is Dr. Thompson Grayer. Nephrologist is Dr. Edrick Oh.   PMH includes:  HTN, DM, atrial fibrillation, 2nd degree heart block, pacemaker, CKD (stage IV, not yet on HD, has access established, on transplant waiting list), hyperlipidemia, post-op N/V. Never smoker. BMI 29  Medications include: ASA, carvedilol, fenofibrate, lasix, glimepiride, atrovent, welchol  Preoperative labs reviewed.   - HgbA1c 7.8, glucose 108.  - Cr 4.2, BUN 48. I spoke with Dr. Justin Mend- CKD is stable and he feels pt is ok to proceed with surgery. He recommends usual caution and monitoring of renal function perioperatively.   EKG 06/26/15: electronic ventricular pacemaker  Stress echo 07/26/15 (care everywhere):  - The patient had no chest pain during stress - The patient achieved 88 % of maximum predicted heart rate. - Normal left ventricular function and global wall motion with stress. - Negative dobutamine echocardiography for inducible ischemia at target heart rate. - Nondiagnostic stress ECG due to pacemaker rhythm.  Echo 07/23/15:  - The left ventricular size is normal. There is mild concentric left ventricular hypertrophy. Left ventricular systolic function is normal. LV ejection fraction = 55%. Left ventricular filling pattern is impaired. - Abnormal (paradoxical) septal motion consistent with RV pacemaker. - - There is apical LV wall mild hypokinesis - The right ventricle is normal in size and function. - There is mild aortic regurgitation. - There is trace mitral regurgitation. - There is no pericardial effusion. - Compared to the prior study dated 06/13/2014, there is no significant change.  Perioperative prescription form for pacemakers notes procedure should not interfere with device function.  If  no changes, I anticipate pt can proceed with surgery as scheduled.   Willeen Cass, FNP-BC [facility]/Anesthesiology Phone: [phone] 02/06/2016 4:35 PM

## 2016-02-07 MED ORDER — DEXTROSE 5 % IV SOLN
3.0000 g | INTRAVENOUS | Status: DC
  Filled 2016-02-07: qty 3000

## 2016-02-08 ENCOUNTER — Ambulatory Visit (HOSPITAL_COMMUNITY): Payer: Medicare Other | Admitting: Anesthesiology

## 2016-02-08 ENCOUNTER — Ambulatory Visit (HOSPITAL_COMMUNITY): Payer: Medicare Other | Admitting: Emergency Medicine

## 2016-02-08 ENCOUNTER — Encounter (HOSPITAL_COMMUNITY): Payer: Self-pay | Admitting: *Deleted

## 2016-02-08 ENCOUNTER — Ambulatory Visit (HOSPITAL_COMMUNITY)
Admission: RE | Admit: 2016-02-08 | Discharge: 2016-02-08 | Disposition: A | Discharge status: Home / Self Care | Payer: Medicare Other | Source: Ambulatory Visit | Attending: Surgery | Admitting: Surgery

## 2016-02-08 ENCOUNTER — Encounter (HOSPITAL_COMMUNITY)
Admission: RE | Disposition: A | Discharge status: Home / Self Care | Payer: Self-pay | Source: Ambulatory Visit | Attending: Surgery

## 2016-02-08 DIAGNOSIS — Z950 Presence of cardiac pacemaker: Secondary | ICD-10-CM | POA: Diagnosis not present

## 2016-02-08 DIAGNOSIS — Z992 Dependence on renal dialysis: Secondary | ICD-10-CM | POA: Insufficient documentation

## 2016-02-08 DIAGNOSIS — E7800 Pure hypercholesterolemia, unspecified: Secondary | ICD-10-CM | POA: Diagnosis not present

## 2016-02-08 DIAGNOSIS — Z6830 Body mass index (BMI) 30.0-30.9, adult: Secondary | ICD-10-CM | POA: Insufficient documentation

## 2016-02-08 DIAGNOSIS — Z79899 Other long term (current) drug therapy: Secondary | ICD-10-CM | POA: Insufficient documentation

## 2016-02-08 DIAGNOSIS — E1122 Type 2 diabetes mellitus with diabetic chronic kidney disease: Secondary | ICD-10-CM | POA: Insufficient documentation

## 2016-02-08 DIAGNOSIS — C4361 Malignant melanoma of right upper limb, including shoulder: Secondary | ICD-10-CM | POA: Diagnosis not present

## 2016-02-08 DIAGNOSIS — I129 Hypertensive chronic kidney disease with stage 1 through stage 4 chronic kidney disease, or unspecified chronic kidney disease: Secondary | ICD-10-CM | POA: Insufficient documentation

## 2016-02-08 DIAGNOSIS — Z7984 Long term (current) use of oral hypoglycemic drugs: Secondary | ICD-10-CM | POA: Insufficient documentation

## 2016-02-08 DIAGNOSIS — N189 Chronic kidney disease, unspecified: Secondary | ICD-10-CM | POA: Diagnosis not present

## 2016-02-08 DIAGNOSIS — E669 Obesity, unspecified: Secondary | ICD-10-CM | POA: Insufficient documentation

## 2016-02-08 DIAGNOSIS — Z7982 Long term (current) use of aspirin: Secondary | ICD-10-CM | POA: Diagnosis not present

## 2016-02-08 DIAGNOSIS — C439 Malignant melanoma of skin, unspecified: Secondary | ICD-10-CM

## 2016-02-08 HISTORY — PX: EXCISION MELANOMA WITH SENTINEL LYMPH NODE BIOPSY: SHX5628

## 2016-02-08 LAB — GLUCOSE, CAPILLARY: Glucose-Capillary: 132 mg/dL — ABNORMAL HIGH (ref 65–99)

## 2016-02-08 SURGERY — EXCISION, MELANOMA, WITH SENTINEL LYMPH NODE BIOPSY
Anesthesia: General | Site: Shoulder | Laterality: Right

## 2016-02-08 MED ORDER — 0.9 % SODIUM CHLORIDE (POUR BTL) OPTIME
TOPICAL | Status: DC | PRN
  Administered 2016-02-08: 1000 mL

## 2016-02-08 MED ORDER — METHYLENE BLUE 0.5 % INJ SOLN
INTRAVENOUS | Status: AC
  Filled 2016-02-08: qty 10

## 2016-02-08 MED ORDER — ROCURONIUM BROMIDE 50 MG/5ML IV SOLN
INTRAVENOUS | Status: AC
  Filled 2016-02-08: qty 1

## 2016-02-08 MED ORDER — FENTANYL CITRATE (PF) 100 MCG/2ML IJ SOLN
INTRAMUSCULAR | Status: AC
  Administered 2016-02-08: 100 ug
  Filled 2016-02-08: qty 2

## 2016-02-08 MED ORDER — FENTANYL CITRATE (PF) 250 MCG/5ML IJ SOLN
INTRAMUSCULAR | Status: DC | PRN
  Administered 2016-02-08 (×2): 25 ug via INTRAVENOUS
  Administered 2016-02-08: 75 ug via INTRAVENOUS

## 2016-02-08 MED ORDER — PROPOFOL 10 MG/ML IV BOLUS
INTRAVENOUS | Status: AC
  Filled 2016-02-08: qty 20

## 2016-02-08 MED ORDER — SUGAMMADEX SODIUM 200 MG/2ML IV SOLN
INTRAVENOUS | Status: AC
  Filled 2016-02-08: qty 2

## 2016-02-08 MED ORDER — BUPIVACAINE-EPINEPHRINE (PF) 0.25% -1:200000 IJ SOLN
INTRAMUSCULAR | Status: AC
  Filled 2016-02-08: qty 30

## 2016-02-08 MED ORDER — SODIUM CHLORIDE 0.9 % IV SOLN
INTRAVENOUS | Status: DC
  Administered 2016-02-08 (×2): via INTRAVENOUS

## 2016-02-08 MED ORDER — MIDAZOLAM HCL 2 MG/2ML IJ SOLN
INTRAMUSCULAR | Status: AC
  Filled 2016-02-08: qty 2

## 2016-02-08 MED ORDER — LIDOCAINE 2% (20 MG/ML) 5 ML SYRINGE
INTRAMUSCULAR | Status: AC
  Filled 2016-02-08: qty 5

## 2016-02-08 MED ORDER — PHENYLEPHRINE HCL 10 MG/ML IJ SOLN
INTRAMUSCULAR | Status: DC | PRN
  Administered 2016-02-08 (×3): 80 ug via INTRAVENOUS

## 2016-02-08 MED ORDER — ONDANSETRON HCL 4 MG/2ML IJ SOLN
INTRAMUSCULAR | Status: DC | PRN
  Administered 2016-02-08: 4 mg via INTRAVENOUS

## 2016-02-08 MED ORDER — HYDROMORPHONE HCL 1 MG/ML IJ SOLN: 0.2500 mg | INTRAMUSCULAR | Status: DC | PRN

## 2016-02-08 MED ORDER — OXYCODONE-ACETAMINOPHEN 5-325 MG PO TABS: 1.0000 | ORAL_TABLET | ORAL | Status: DC | PRN

## 2016-02-08 MED ORDER — CHLORHEXIDINE GLUCONATE 4 % EX LIQD: 1.0000 "application " | Freq: Once | CUTANEOUS | Status: DC

## 2016-02-08 MED ORDER — PHENYLEPHRINE 40 MCG/ML (10ML) SYRINGE FOR IV PUSH (FOR BLOOD PRESSURE SUPPORT)
PREFILLED_SYRINGE | INTRAVENOUS | Status: AC
  Filled 2016-02-08: qty 10

## 2016-02-08 MED ORDER — TECHNETIUM TC 99M SULFUR COLLOID FILTERED
0.5000 | Freq: Once | INTRAVENOUS | Status: AC | PRN
  Administered 2016-02-08: 0.5 via INTRADERMAL

## 2016-02-08 MED ORDER — SUCCINYLCHOLINE CHLORIDE 20 MG/ML IJ SOLN
INTRAMUSCULAR | Status: DC | PRN
  Administered 2016-02-08: 120 mg via INTRAVENOUS

## 2016-02-08 MED ORDER — LIDOCAINE HCL (CARDIAC) 20 MG/ML IV SOLN
INTRAVENOUS | Status: DC | PRN
  Administered 2016-02-08: 100 mg via INTRAVENOUS

## 2016-02-08 MED ORDER — CEFAZOLIN SODIUM 1 G IJ SOLR
INTRAMUSCULAR | Status: AC
  Filled 2016-02-08: qty 20

## 2016-02-08 MED ORDER — BUPIVACAINE-EPINEPHRINE 0.25% -1:200000 IJ SOLN
INTRAMUSCULAR | Status: DC | PRN
  Administered 2016-02-08: 20 mL

## 2016-02-08 MED ORDER — EPHEDRINE SULFATE 50 MG/ML IJ SOLN
INTRAMUSCULAR | Status: DC | PRN
  Administered 2016-02-08 (×3): 10 mg via INTRAVENOUS

## 2016-02-08 MED ORDER — FENTANYL CITRATE (PF) 100 MCG/2ML IJ SOLN: 100.0000 ug | Freq: Once | INTRAMUSCULAR | Status: DC

## 2016-02-08 MED ORDER — CEFAZOLIN SODIUM-DEXTROSE 2-4 GM/100ML-% IV SOLN
INTRAVENOUS | Status: AC
  Administered 2016-02-08: 2 g via INTRAVENOUS
  Filled 2016-02-08: qty 100

## 2016-02-08 MED ORDER — EPHEDRINE 5 MG/ML INJ
INTRAVENOUS | Status: AC
  Filled 2016-02-08: qty 10

## 2016-02-08 MED ORDER — FENTANYL CITRATE (PF) 250 MCG/5ML IJ SOLN
INTRAMUSCULAR | Status: AC
  Filled 2016-02-08: qty 5

## 2016-02-08 MED ORDER — ONDANSETRON HCL 4 MG/2ML IJ SOLN
INTRAMUSCULAR | Status: AC
  Filled 2016-02-08: qty 2

## 2016-02-08 MED ORDER — PROPOFOL 10 MG/ML IV BOLUS
INTRAVENOUS | Status: DC | PRN
  Administered 2016-02-08: 170 mg via INTRAVENOUS
  Administered 2016-02-08: 30 mg via INTRAVENOUS

## 2016-02-08 SURGICAL SUPPLY — 65 items
BENZOIN TINCTURE PRP APPL 2/3 (GAUZE/BANDAGES/DRESSINGS) IMPLANT
BLADE SURG 10 STRL SS (BLADE) ×3 IMPLANT
BLADE SURG 15 STRL LF DISP TIS (BLADE) ×1 IMPLANT
BLADE SURG 15 STRL SS (BLADE) ×2
BLADE SURG ROTATE 9660 (MISCELLANEOUS) IMPLANT
BNDG GAUZE ELAST 4 BULKY (GAUZE/BANDAGES/DRESSINGS) IMPLANT
CANISTER SUCTION 2500CC (MISCELLANEOUS) IMPLANT
CHLORAPREP W/TINT 26ML (MISCELLANEOUS) ×3 IMPLANT
CLOSURE WOUND 1/2 X4 (GAUZE/BANDAGES/DRESSINGS)
COVER PROBE W GEL 5X96 (DRAPES) ×3 IMPLANT
COVER SURGICAL LIGHT HANDLE (MISCELLANEOUS) ×9 IMPLANT
DECANTER SPIKE VIAL GLASS SM (MISCELLANEOUS) ×6 IMPLANT
DRAPE LAPAROTOMY 100X72 PEDS (DRAPES) ×3 IMPLANT
DRAPE LAPAROTOMY T 102X78X121 (DRAPES) IMPLANT
DRAPE ORTHO SPLIT 77X108 STRL (DRAPES)
DRAPE SURG ORHT 6 SPLT 77X108 (DRAPES) IMPLANT
DRAPE UTILITY XL STRL (DRAPES) ×6 IMPLANT
ELECT CAUTERY BLADE 6.4 (BLADE) ×3 IMPLANT
ELECT REM PT RETURN 9FT ADLT (ELECTROSURGICAL) ×3
ELECTRODE REM PT RTRN 9FT ADLT (ELECTROSURGICAL) ×1 IMPLANT
GAUZE SPONGE 4X4 12PLY STRL (GAUZE/BANDAGES/DRESSINGS) IMPLANT
GLOVE BIO SURGEON STRL SZ 6.5 (GLOVE) ×2 IMPLANT
GLOVE BIO SURGEON STRL SZ8 (GLOVE) ×9 IMPLANT
GLOVE BIO SURGEONS STRL SZ 6.5 (GLOVE) ×1
GLOVE BIOGEL PI IND STRL 8 (GLOVE) ×2 IMPLANT
GLOVE BIOGEL PI INDICATOR 8 (GLOVE) ×4
GOWN STRL REUS W/ TWL LRG LVL3 (GOWN DISPOSABLE) ×1 IMPLANT
GOWN STRL REUS W/ TWL XL LVL3 (GOWN DISPOSABLE) ×1 IMPLANT
GOWN STRL REUS W/TWL LRG LVL3 (GOWN DISPOSABLE) ×2
GOWN STRL REUS W/TWL XL LVL3 (GOWN DISPOSABLE) ×2
KIT BASIN OR (CUSTOM PROCEDURE TRAY) ×3 IMPLANT
KIT ROOM TURNOVER OR (KITS) ×3 IMPLANT
LIQUID BAND (GAUZE/BANDAGES/DRESSINGS) ×6 IMPLANT
NEEDLE 22X1 1/2 (OR ONLY) (NEEDLE) ×3 IMPLANT
NEEDLE FILTER BLUNT 18X 1/2SAF (NEEDLE)
NEEDLE FILTER BLUNT 18X1 1/2 (NEEDLE) IMPLANT
NEEDLE HYPO 25GX1X1/2 BEV (NEEDLE) ×3 IMPLANT
NS IRRIG 1000ML POUR BTL (IV SOLUTION) ×3 IMPLANT
PACK SURGICAL SETUP 50X90 (CUSTOM PROCEDURE TRAY) ×3 IMPLANT
PAD ARMBOARD 7.5X6 YLW CONV (MISCELLANEOUS) ×3 IMPLANT
PENCIL BUTTON HOLSTER BLD 10FT (ELECTRODE) ×3 IMPLANT
SPONGE LAP 18X18 X RAY DECT (DISPOSABLE) ×6 IMPLANT
STAPLER VISISTAT 35W (STAPLE) IMPLANT
STOCKINETTE IMPERVIOUS LG (DRAPES) IMPLANT
STRIP CLOSURE SKIN 1/2X4 (GAUZE/BANDAGES/DRESSINGS) IMPLANT
SUT ETHILON 3 0 FSL (SUTURE) IMPLANT
SUT ETHILON 4 0 PS 2 18 (SUTURE) IMPLANT
SUT MNCRL AB 3-0 PS2 18 (SUTURE) ×3 IMPLANT
SUT MNCRL AB 4-0 PS2 18 (SUTURE) ×3 IMPLANT
SUT MON AB 4-0 PC3 18 (SUTURE) IMPLANT
SUT SILK 2 0 SH (SUTURE) IMPLANT
SUT VIC AB 0 CT1 27 (SUTURE) ×2
SUT VIC AB 0 CT1 27XBRD ANBCTR (SUTURE) ×1 IMPLANT
SUT VIC AB 2-0 SH 27 (SUTURE)
SUT VIC AB 2-0 SH 27X BRD (SUTURE) IMPLANT
SUT VIC AB 3-0 SH 27 (SUTURE) ×2
SUT VIC AB 3-0 SH 27XBRD (SUTURE) ×1 IMPLANT
SYR BULB 3OZ (MISCELLANEOUS) ×3 IMPLANT
SYR CONTROL 10ML LL (SYRINGE) ×3 IMPLANT
TOWEL OR 17X24 6PK STRL BLUE (TOWEL DISPOSABLE) ×3 IMPLANT
TOWEL OR 17X26 10 PK STRL BLUE (TOWEL DISPOSABLE) ×3 IMPLANT
TUBE CONNECTING 12'X1/4 (SUCTIONS)
TUBE CONNECTING 12X1/4 (SUCTIONS) IMPLANT
UNDERPAD 30X30 INCONTINENT (UNDERPADS AND DIAPERS) ×3 IMPLANT
YANKAUER SUCT BULB TIP NO VENT (SUCTIONS) IMPLANT

## 2016-02-08 NOTE — Anesthesia Procedure Notes (Signed)
Procedure Name: Intubation Date/Time: 02/08/2016 9:38 AM Performed by: Jenne Campus Pre-anesthesia Checklist: Patient identified, Emergency Drugs available, Suction available and Patient being monitored Patient Re-evaluated:Patient Re-evaluated prior to inductionOxygen Delivery Method: Circle System Utilized Preoxygenation: Pre-oxygenation with 100% oxygen Intubation Type: IV induction Ventilation: Mask ventilation without difficulty and Oral airway inserted - appropriate to patient size Laryngoscope Size: Miller and 3 Grade View: Grade I Tube type: Oral Tube size: 7.5 mm Number of attempts: 1 Airway Equipment and Method: Stylet and Oral airway Placement Confirmation: ETT inserted through vocal cords under direct vision,  positive ETCO2 and breath sounds checked- equal and bilateral Secured at: 21 cm Tube secured with: Tape Dental Injury: Teeth and Oropharynx as per pre-operative assessment

## 2016-02-08 NOTE — Anesthesia Preprocedure Evaluation (Addendum)
Anesthesia Evaluation  Patient identified by MRN, date of birth, ID band Patient awake    Reviewed: Allergy & Precautions, NPO status , Patient's Chart, lab work & pertinent test results  History of Anesthesia Complications (+) PONV and history of anesthetic complications  Airway Mallampati: II  TM Distance: >3 FB Neck ROM: Full    Dental  (+) Teeth Intact, Dental Advisory Given   Pulmonary neg pulmonary ROS,    breath sounds clear to auscultation       Cardiovascular hypertension, + dysrhythmias + pacemaker  Rhythm:Regular Rate:Normal  Echo 07/23/15:  - The left ventricular size is normal. There is mild concentric left ventricular hypertrophy. Left ventricular systolic function is normal. LV ejection fraction = 55%. Left ventricular filling pattern is impaired. - Abnormal (paradoxical) septal motion consistent with RV pacemaker. - - There is apical LV wall mild hypokinesis - The right ventricle is normal in size and function. - There is mild aortic regurgitation. - There is trace mitral regurgitation. - There is no pericardial effusion. - Compared to the prior study dated 06/13/2014, there is no significant change.   Neuro/Psych negative neurological ROS     GI/Hepatic   Endo/Other  diabetes  Renal/GU CRF and Renal InsufficiencyRenal disease     Musculoskeletal  (+) Arthritis ,   Abdominal (+) + obese,   Peds  Hematology   Anesthesia Other Findings   Reproductive/Obstetrics                          Anesthesia Physical Anesthesia Plan  ASA: III  Anesthesia Plan: General   Post-op Pain Management:    Induction: Intravenous  Airway Management Planned: Oral ETT  Additional Equipment:   Intra-op Plan:   Post-operative Plan: Extubation in OR  Informed Consent: I have reviewed the patients History and Physical, chart, labs and discussed the procedure including the risks, benefits and  alternatives for the proposed anesthesia with the patient or authorized representative who has indicated his/her understanding and acceptance.   Dental advisory given  Plan Discussed with: Surgeon and CRNA  Anesthesia Plan Comments:         Anesthesia Quick Evaluation

## 2016-02-08 NOTE — H&P (Signed)
H&P   ROMYN ABEYTA (MR# [id number])      H&P Info    Chief Strategy Officer Note Status Last Update User Last Update Date/Time   [name], MD Signed [name], MD     H&P    Expand All Collapse All   Phyllis Ginger. [facility]  Location: [facility] Patient #: [id number] DOB: [birth year] Married / Language: [name] / Race: White Male  History of Present Illness [name] A. Hades Mathew MD;  Patient words: Patient sent at the request of Dr. [name] for melanoma over the patient's right posterior shoulder. He had 2 shave biopsies with the most medial one showing superficial spreading melanoma 1.1 mm thickness Clark level III-IV. There was no tumor regression and in tumor infiltration lymphocytes were present and focally brisk. He has a history of left arm melanoma resected in 2000. These areas were picked up on physical exam. Denies any bleeding or ulceration of these areas. He has a fistula in his right arm for hemodialysis but has not required that yet. He has a pacemaker multiple other medical problems. He is followed by Dr. Tamala Julian cardiology for his cardiac issues. Denies chest pain or shortness of breath currently. He otherwise feels well and is active.  The patient is a 80 year old male.   Other Problems Davy Pique Bynum, CMA; Chronic Renal Failure Syndrome Diabetes Mellitus High blood pressure Hypercholesterolemia Melanoma  Past Surgical History Davy Pique Bynum, CMA;  Colon Polyp Removal - Colonoscopy Colon Polyp Removal - Open Dialysis Shunt / Fistula Vasectomy  Diagnostic Studies History (Sonya Bynum,  Colonoscopy 1-5 years ago  Medication History (Sonya Bynum, CMA;  Carvedilol (25MG  Tablet, Oral) Active. Finasteride (5MG  Tablet, Oral) Active. Glimepiride (4MG  Tablet, Oral) Active. Triamcinolone Acetonide (0.1% Cream, External) Active. Welchol (625MG  Tablet, Oral) Active. Ipratropium Bromide (0.03% Solution, Nasal) Active. Carvedilol (12.5MG  Tablet, Oral)  Active. Fluticasone Propionate (50MCG/ACT Suspension, Nasal) Active. Tamsulosin HCl (0.4MG  Capsule, Oral) Active. Pioglitazone HCl (15MG  Tablet, Oral) Active. Furosemide (80MG  Tablet, Oral) Active. Medications Reconciled  Social History Davy Pique Bynum, CMA;  Caffeine use Tea. No alcohol use Tobacco use Never smoker.  Family History [name], Handley;  Colon Cancer Brother. Diabetes Mellitus Mother.     Review of Systems (Sonya Bynum CMA;  HEENT Present- Hearing Loss. Not Present- Earache, Hoarseness, Nose Bleed, Oral Ulcers, Ringing in the Ears, Seasonal Allergies, Sinus Pain, Sore Throat, Visual Disturbances, Wears glasses/contact lenses and Yellow Eyes. Respiratory Not Present- Bloody sputum, Chronic Cough, Difficulty Breathing, Snoring and Wheezing. Cardiovascular Present- Leg Cramps and Swelling of Extremities. Not Present- Chest Pain, Difficulty Breathing Lying Down, Palpitations, Rapid Heart Rate and Shortness of Breath. Male Genitourinary Present- Frequency. Not Present- Blood in Urine, Change in Urinary Stream, Impotence, Nocturia, Painful Urination, Urgency and Urine Leakage. Neurological Not Present- Decreased Memory, Fainting, Headaches, Numbness, Seizures, Tingling, Tremor, Trouble walking and Weakness. Endocrine Present- New Diabetes. Not Present- Cold Intolerance, Excessive Hunger, Hair Changes, Heat Intolerance and Hot flashes. Hematology Present- Easy Bruising. Not Present- Excessive bleeding, Gland problems, HIV and Persistent Infections.  Vitals (Sonya Bynum CMA;  12/19/2015 4:23 PM Weight: 200 lb Height: 68in Body Surface Area: 2.04 m Body Mass Index: 30.41 kg/m  Pulse: 81 (Regular)  BP: 138/82 (Sitting, Left Arm, Standard)      Physical Exam (Jerrelle Michelsen A. Deagen Krass MD;   General Mental Status-Alert. General Appearance-Consistent with stated age. Hydration-Well hydrated. Voice-Normal.  Integumentary Note: Over right scapular  region or 2 open wounds each measuring a centimeter. The medial and according to his wife was were  the melanoma was. There within 3 cm of each other.  Head and Neck Head-normocephalic, atraumatic with no lesions or palpable masses. Trachea-midline. Thyroid Gland Characteristics - normal size and consistency.  Chest and Lung Exam Chest and lung exam reveals -quiet, even and easy respiratory effort with no use of accessory muscles and on auscultation, normal breath sounds, no adventitious sounds and normal vocal resonance. Inspection Chest Wall - Normal. Back - normal. Note: Pacemaker upper chest.  Cardiovascular Cardiovascular examination reveals -normal heart sounds, regular rate and rhythm with no murmurs and normal pedal pulses bilaterally.  Peripheral Vascular Note: Fistula right forearm.  Lymphatic Head & Neck  General Head & Neck Lymphatics: Bilateral - Description - Normal. Axillary  General Axillary Region: Bilateral - Description - Normal. Tenderness - Non Tender.    Assessment & Plan (Neidy Guerrieri A. Dannell Gortney MD;   MELANOMA OF BACK (C43.59) Impression: recommend wide excision and SLN mapping  Discuss pros and cons of sentinel lymph node mapping for intermediate thickness melanoma which is the current recommendation. Given his age and other medical problems he may be at increased risk for complication. He also has had increased risk of right arm lymphedema given the presence of a fistula. Admission of sentinel lymph node mapping can be done but this may undergo staged him and potentially impact his overall treatment and survival. He wishes to proceed with wide excision melanoma right shoulder and right axillary sentinel lymph node mapping. We will obtain lymphoscintigraphy preop to ensure that the right axilla is the target region not his neck nor his groin given the location of melanoma over right scapular region. Risk of sentinel lymph node mapping include bleeding,  infection, lymphedema, shoulder pain. stiffness, dye allergy. cosmetic deformity , blood clots, death, need for more surgery. Pt agres to proceed. Risk of excision includes bleeding, infection, poor wound healing, large scar, injury to neighboring nerves artery veins, need further surgery, open wound wound care. Other conversations included death, DVT, worsening medical problems, and need for other supportive treatments.    We'll obtain cardiac clearance  Current Plans Pt Education - Melanoma: discussed with patient and provided information. The anatomy and the physiology was discussed. The pathophysiology and natural history of the disease was discussed. Options were discussed and recommendations were made. Technique, risks, benefits, & alternatives were discussed. Risks such as stroke, heart attack, bleeding, indection, death, and other risks discussed. Questions answered. The patient agrees to proceed. Pt Education - Melanoma: cancer You are being scheduled for surgery - Our schedulers will call you.  You should hear from our office's scheduling department within 5 working days about the location, date, and time of surgery. We try to make accommodations for patient's preferences in scheduling surgery, but sometimes the OR schedule or the surgeon's schedule prevents [place] from making those accommodations.  If you have not heard from our office [phone]) in 5 working days, call the office and ask for your surgeon's nurse.  If you have other questions about your diagnosis, plan, or surgery, call the office and ask for your surgeon's nurse.

## 2016-02-08 NOTE — Op Note (Signed)
Preoperative diagnosis: Stage II superficial spreading melanoma  Posterior right shoulder  Postoperative diagnosis: Same  Procedure:  Wide excision right shoulder melanoma and right axillary sentinel lymph node mapping   Surgeon: [name] M.D.  Anesthesia: Gen. With 0.25% Sensorcaine local with epinephrine   EBL: Minimal  Specimens: Skin right posterior shoulder  With previous biopsy site and the second biopsy site as part of margin and 3 right axillary sentinel nodes  Drains: None  Indications for procedure the patient is a 80 year old male stage II superficial spreading melanoma posterior right shoulder. He presents for wide excision and sentinel lymph node mapping.Sentinel lymph node mapping and dissection has been discussed with the patient.  Risk of bleeding,  Infection,  Seroma formation,  Additional procedures,,  Shoulder weakness ,  Shoulder stiffness,  Nerve and blood vessel injury and reaction to the mapping dyes have been discussed.  Alternatives to surgery have been discussed with the patient.  The patient agrees to proceed.The procedure has been discussed with the patient.  Alternative therapies have been discussed with the patient.  Operative risks include bleeding,  Infection,  Organ injury,  Nerve injury,  Blood vessel injury,  DVT,  Pulmonary embolism,  Death,  And possible reoperation.  Medical management risks include worsening of present situation.  The success of the procedure is 50 -90 % at treating patients symptoms.  The patient understands and agrees to proceed.  Description of procedure: Patient was met  In the holding area. The operative site was marked.He received injection of technetium sulfur colloid preoperatively. Questions are answered. He was taken back to the operating room and placed upon the OR table. After induction of general anesthesia the right axilla was prepped and draped in a sterile fashion. We chose to do the sentinel node first.Timeout was  done. Neoprobe was used and hot spot was identified in the right axilla.  A portion of incision was made and dissection was carried into the right axillary contents. Identify 3 hot nodes removed these. Background counts approached  Baseline. The wound was irrigated and closed with 3-0 Vicryl and 4-0 Monocryl.  Hemostasis was excellent. Liquid adhesive applied.  The patient was rolled up onto his left side and padded appropriately. Care was taken not to compress his right upper extremity av  Fistula.tthe melanoma site was prepped and draped in a sterile fashion. A second timeout was done. The balloon was infiltrated with local anesthetic. An oblique incision was made to ensure a 1 cm margin around the melanoma site..  There is a second biopsy site was not melanoma adjacent to this and this was incorporated excision given its proximity to the melanoma site. He specimen was passed off the field. We was irrigated and closed with deep layer of 0 Vicryl and 3-0 Monocryl. Liquid adhesive applied. All final counts found to be correct. The area excised was approximately 5 cm x 4 cm. The patient was extubated and taken to recovery in satisfactory condition.

## 2016-02-08 NOTE — Anesthesia Postprocedure Evaluation (Signed)
Anesthesia Post Note  Patient: James David  Procedure(s) Performed: Procedure(s) (LRB): WIDE EXCISION RIGHT SHOULDER MELANOMA WITH RIGHT SENTINEL LYMPH NODE BIOPSY (Right)  Patient location during evaluation: PACU Anesthesia Type: General Level of consciousness: awake and alert Pain management: pain level controlled Vital Signs Assessment: post-procedure vital signs reviewed and stable Respiratory status: spontaneous breathing, nonlabored ventilation, respiratory function stable and patient connected to nasal cannula oxygen Cardiovascular status: blood pressure returned to baseline and stable Postop Assessment: no signs of nausea or vomiting Anesthetic complications: no    Last Vitals:  Filed Vitals:   02/08/16 1225 02/08/16 1230  BP: 144/92   Pulse: 66 66  Temp:  36.5 C  Resp: 15 13    Last Pain:  Filed Vitals:   02/08/16 1246  PainSc: 0-No pain                 Aadvika Konen,JAMES TERRILL

## 2016-02-08 NOTE — Discharge Instructions (Signed)
GENERAL SURGERY: POST OP INSTRUCTIONS ° °1. DIET: Follow a light bland diet the first 24 hours after arrival home, such as soup, liquids, crackers, etc.  Be sure to include lots of fluids daily.  Avoid fast food or heavy meals as your are more likely to get nauseated.   °2. Take your usually prescribed home medications unless otherwise directed. °3. PAIN CONTROL: °a. Pain is best controlled by a usual combination of three different methods TOGETHER: °i. Ice/Heat °ii. Over the counter pain medication °iii. Prescription pain medication °b. Most patients will experience some swelling and bruising around the incisions.  Ice packs or heating pads (30-60 minutes up to 6 times a day) will help. Use ice for the first few days to help decrease swelling and bruising, then switch to heat to help relax tight/sore spots and speed recovery.  Some people prefer to use ice alone, heat alone, alternating between ice & heat.  Experiment to what works for you.  Swelling and bruising can take several weeks to resolve.   °c. It is helpful to take an over-the-counter pain medication regularly for the first few weeks.  Choose one of the following that works best for you: °i. Naproxen (Aleve, etc)  Two 220mg tabs twice a day °ii. Ibuprofen (Advil, etc) Three 200mg tabs four times a day (every meal & bedtime) °iii. Acetaminophen (Tylenol, etc) 500-650mg four times a day (every meal & bedtime) °d. A  prescription for pain medication (such as oxycodone, hydrocodone, etc) should be given to you upon discharge.  Take your pain medication as prescribed.  °i. If you are having problems/concerns with the prescription medicine (does not control pain, nausea, vomiting, rash, itching, etc), please call us [phone] to see if we need to switch you to a different pain medicine that will work better for you and/or control your side effect better. °ii. If you need a refill on your pain medication, please contact your pharmacy.  They will contact our  office to request authorization. Prescriptions will not be filled after 5 pm or on week-ends. °4. Avoid getting constipated.  Between the surgery and the pain medications, it is common to experience some constipation.  Increasing fluid intake and taking a fiber supplement (such as Metamucil, Citrucel, FiberCon, MiraLax, etc) 1-2 times a day regularly will usually help prevent this problem from occurring.  A mild laxative (prune juice, Milk of Magnesia, MiraLax, etc) should be taken according to package directions if there are no bowel movements after 48 hours.   °5. Wash / shower every day.  You may shower over the dressings as they are waterproof.  Continue to shower over incision(s) after the dressing is off. °6. Remove your waterproof bandages 5 days after surgery.  You may leave the incision open to air.  You may have skin tapes (Steri Strips) covering the incision(s).  Leave them on until one week, then remove.  You may replace a dressing/Band-Aid to cover the incision for comfort if you wish.  ° ° ° ° °7. ACTIVITIES as tolerated:   °a. You may resume regular (light) daily activities beginning the next day--such as daily self-care, walking, climbing stairs--gradually increasing activities as tolerated.  If you can walk 30 minutes without difficulty, it is safe to try more intense activity such as jogging, treadmill, bicycling, low-impact aerobics, swimming, etc. °b. Save the most intensive and strenuous activity for last such as sit-ups, heavy lifting, contact sports, etc  Refrain from any heavy lifting or straining until you   are off narcotics for pain control.   °c. DO NOT PUSH THROUGH PAIN.  Let pain be your guide: If it hurts to do something, don't do it.  Pain is your body warning you to avoid that activity for another week until the pain goes down. °d. You may drive when you are no longer taking prescription pain medication, you can comfortably wear a seatbelt, and you can safely maneuver your car and  apply brakes. °e. You may have sexual intercourse when it is comfortable.  °8. FOLLOW UP in our office °a. Please call [facility] at [phone] to set up an appointment to see your surgeon in the office for a follow-up appointment approximately 2-3 weeks after your surgery. °b. Make sure that you call for this appointment the day you arrive home to insure a convenient appointment time. °9. IF YOU HAVE DISABILITY OR FAMILY LEAVE FORMS, BRING THEM TO THE OFFICE FOR PROCESSING.  DO NOT GIVE THEM TO YOUR DOCTOR. ° ° °WHEN TO CALL US [phone]: °1. Poor pain control °2. Reactions / problems with new medications (rash/itching, nausea, etc)  °3. Fever over 101.5 F (38.5 C) °4. Worsening swelling or bruising °5. Continued bleeding from incision. °6. Increased pain, redness, or drainage from the incision °7. Difficulty breathing / swallowing ° ° The clinic staff is available to answer your questions during regular business hours (8:30am-5pm).  Please don’t hesitate to call and ask to speak to one of our nurses for clinical concerns.  ° If you have a medical emergency, go to the nearest emergency room or call 911. ° A surgeon from [facility] is always on call at the hospitals ° ° °[facility] °[address], [address], [place], [place]  [address] ? °MAIN: [phone] ? TOLL FREE: [phone] ?  °FAX [phone] °[email or web address] ° °

## 2016-02-08 NOTE — Transfer of Care (Signed)
Immediate Anesthesia Transfer of Care Note  Patient: James David  Procedure(s) Performed: Procedure(s): WIDE EXCISION RIGHT SHOULDER MELANOMA WITH RIGHT SENTINEL LYMPH NODE BIOPSY (Right)  Patient Location: PACU  Anesthesia Type:General  Level of Consciousness: awake, oriented and patient cooperative  Airway & Oxygen Therapy: Patient Spontanous Breathing and Patient connected to face mask oxygen  Post-op Assessment: Report given to RN and Post -op Vital signs reviewed and stable  Post vital signs: Reviewed  Last Vitals:  Filed Vitals:   02/08/16 0758 02/08/16 0820  BP: 200/99 179/86  Pulse: 72   Temp: 36.6 C   Resp: 18     Last Pain: There were no vitals filed for this visit.    Patients Stated Pain Goal: 3 ([phone])  Complications: No apparent anesthesia complications

## 2016-02-08 NOTE — Interval H&P Note (Signed)
History and Physical Interval Note:  02/08/2016 9:19 AM  James David  has presented today for surgery, with the diagnosis of Melanoma right shoulder   The various methods of treatment have been discussed with the patient and family. After consideration of risks, benefits and other options for treatment, the patient has consented to  Procedure(s): WIDE EXCISION RIGHT SHOULDER MELANOMA WITH RIGHT SENTINEL LYMPH NODE BIOPSY (Right) as a surgical intervention .  The patient's history has been reviewed, patient examined, no change in status, stable for surgery.  I have reviewed the patient's chart and labs.  Questions were answered to the patient's satisfaction.     Inza Mikrut A.

## 2016-02-09 ENCOUNTER — Encounter (HOSPITAL_COMMUNITY): Payer: Self-pay | Admitting: Surgery

## 2016-03-06 ENCOUNTER — Encounter: Payer: Self-pay | Admitting: Hematology

## 2016-03-06 ENCOUNTER — Telehealth: Payer: Self-pay | Admitting: Hematology

## 2016-03-06 NOTE — Telephone Encounter (Signed)
In take completed, pt confirmed appt. Date/time, lt mess for referring provider of appt date/time. Mailed new pt letter

## 2016-03-07 DIAGNOSIS — N400 Enlarged prostate without lower urinary tract symptoms: Secondary | ICD-10-CM | POA: Insufficient documentation

## 2016-03-10 DIAGNOSIS — N289 Disorder of kidney and ureter, unspecified: Secondary | ICD-10-CM | POA: Insufficient documentation

## 2016-03-13 ENCOUNTER — Telehealth: Payer: Self-pay | Admitting: Hematology

## 2016-03-13 ENCOUNTER — Ambulatory Visit (HOSPITAL_BASED_OUTPATIENT_CLINIC_OR_DEPARTMENT_OTHER): Payer: Medicare Other | Admitting: Hematology

## 2016-03-13 VITALS — BP 183/72 | HR 68 | Temp 97.7°F | Resp 18 | Ht 70.0 in | Wt 195.8 lb

## 2016-03-13 DIAGNOSIS — I4891 Unspecified atrial fibrillation: Secondary | ICD-10-CM

## 2016-03-13 DIAGNOSIS — C439 Malignant melanoma of skin, unspecified: Secondary | ICD-10-CM | POA: Diagnosis not present

## 2016-03-13 DIAGNOSIS — N184 Chronic kidney disease, stage 4 (severe): Secondary | ICD-10-CM

## 2016-03-13 DIAGNOSIS — C4360 Malignant melanoma of unspecified upper limb, including shoulder: Secondary | ICD-10-CM | POA: Insufficient documentation

## 2016-03-13 NOTE — Telephone Encounter (Signed)
Gave pt cal & avs °

## 2016-03-25 ENCOUNTER — Ambulatory Visit (INDEPENDENT_AMBULATORY_CARE_PROVIDER_SITE_OTHER): Payer: Medicare Other | Admitting: *Deleted

## 2016-03-25 DIAGNOSIS — I441 Atrioventricular block, second degree: Secondary | ICD-10-CM | POA: Diagnosis not present

## 2016-03-25 DIAGNOSIS — Z950 Presence of cardiac pacemaker: Secondary | ICD-10-CM

## 2016-03-25 NOTE — Progress Notes (Signed)
Remote pacemaker transmission.   

## 2016-03-27 ENCOUNTER — Encounter: Payer: Self-pay | Admitting: Cardiology

## 2016-03-27 LAB — CUP PACEART REMOTE DEVICE CHECK
Battery Voltage: 3.01 V
Brady Statistic AP VS Percent: 1 %
Brady Statistic AS VP Percent: 99 %
Brady Statistic RA Percent Paced: 1 %
Brady Statistic RV Percent Paced: 99 %
Implantable Lead Implant Date: 20150507
Implantable Lead Location: 753859
Implantable Lead Model: 1948
Lead Channel Impedance Value: 740 Ohm
Lead Channel Sensing Intrinsic Amplitude: 12 mV
Lead Channel Setting Pacing Amplitude: 2 V
Lead Channel Setting Sensing Sensitivity: 2 mV
MDC IDC LEAD IMPLANT DT: 20150507
MDC IDC LEAD LOCATION: 753860
MDC IDC MSMT BATTERY REMAINING LONGEVITY: 116 mo
MDC IDC MSMT BATTERY REMAINING PERCENTAGE: 95.5 %
MDC IDC MSMT LEADCHNL RA IMPEDANCE VALUE: 390 Ohm
MDC IDC MSMT LEADCHNL RA SENSING INTR AMPL: 0.7 mV
MDC IDC PG SERIAL: 7617100
MDC IDC SESS DTM: 20170710060013
MDC IDC SET LEADCHNL RV PACING AMPLITUDE: 2.5 V
MDC IDC SET LEADCHNL RV PACING PULSEWIDTH: 0.4 ms
MDC IDC STAT BRADY AP VP PERCENT: 1 %
MDC IDC STAT BRADY AS VS PERCENT: 1 %
Pulse Gen Model: 2240

## 2016-04-04 ENCOUNTER — Ambulatory Visit (INDEPENDENT_AMBULATORY_CARE_PROVIDER_SITE_OTHER): Payer: Medicare Other | Admitting: Podiatry

## 2016-04-04 ENCOUNTER — Encounter: Payer: Self-pay | Admitting: Podiatry

## 2016-04-04 DIAGNOSIS — E0821 Diabetes mellitus due to underlying condition with diabetic nephropathy: Secondary | ICD-10-CM

## 2016-04-04 DIAGNOSIS — B351 Tinea unguium: Secondary | ICD-10-CM | POA: Diagnosis not present

## 2016-04-04 DIAGNOSIS — M79673 Pain in unspecified foot: Secondary | ICD-10-CM

## 2016-04-04 NOTE — Progress Notes (Signed)
Patient ID: James David, male   DOB: 1936/03/16, 80 y.o.   MRN: [id number] HPI  Complaint:  Visit Type: Patient returns to my office for continued preventative foot care services. Complaint: Patient states" my nails have grown long and thick and become painful to walk and wear shoes" Patient has been diagnosed with DM with neuropathy.Marland Kitchen He presents for preventative foot care services. No changes to ROS  Podiatric Exam: Vascular: dorsalis pedis and posterior tibial pulses are negative. Capillary return is immediate. Temperature gradient is negative. Skin turgor WNL, bilateral swelling  Sensorium: Normal Semmes Weinstein monofilament test. Normal tactile sensation bilaterally.  Nail Exam: Pt has thick disfigured discolored nails with subungual debris noted bilateral entire nail hallux through fifth toenails Ulcer Exam: There is no evidence of ulcer or pre-ulcerative changes or infection. Orthopedic Exam: Muscle tone and strength are WNL. No limitations in general ROM. No crepitus or effusions noted. Foot type and digits show no abnormalities. Bony prominences are unremarkable. Skin: No Porokeratosis. No infection or ulcers  Diagnosis:  Tinea unguium, Pain in right toe, pain in left toes  Treatment & Plan Procedures and Treatment: Consent by patient was obtained for treatment procedures. The patient understood the discussion of treatment and procedures well. All questions were answered thoroughly reviewed. Debridement of mycotic and hypertrophic toenails, 1 through 5 bilateral and clearing of subungual debris. No ulceration, no infection noted.  Return Visit-Office Procedure: Patient instructed to return to the office for a follow up visit 3 months for continued evaluation and treatment.   Gardiner Barefoot DPM

## 2016-05-03 ENCOUNTER — Encounter: Payer: Self-pay | Admitting: Nurse Practitioner

## 2016-05-23 ENCOUNTER — Encounter: Payer: Self-pay | Admitting: Interventional Cardiology

## 2016-05-23 ENCOUNTER — Ambulatory Visit (INDEPENDENT_AMBULATORY_CARE_PROVIDER_SITE_OTHER): Payer: Medicare Other | Admitting: Interventional Cardiology

## 2016-05-23 VITALS — BP 156/84 | HR 67 | Ht 70.0 in | Wt 195.4 lb

## 2016-05-23 DIAGNOSIS — I484 Atypical atrial flutter: Secondary | ICD-10-CM | POA: Diagnosis not present

## 2016-05-23 DIAGNOSIS — E785 Hyperlipidemia, unspecified: Secondary | ICD-10-CM

## 2016-05-23 DIAGNOSIS — I10 Essential (primary) hypertension: Secondary | ICD-10-CM

## 2016-05-23 DIAGNOSIS — Z950 Presence of cardiac pacemaker: Secondary | ICD-10-CM

## 2016-05-23 DIAGNOSIS — N184 Chronic kidney disease, stage 4 (severe): Secondary | ICD-10-CM

## 2016-05-23 DIAGNOSIS — I5032 Chronic diastolic (congestive) heart failure: Secondary | ICD-10-CM

## 2016-05-23 NOTE — Progress Notes (Signed)
Cardiology Office Note    Date:  05/23/2016   ID:  [name], DOB 03/29/1936, MRN [id number]  PCP:   Melinda Crutch, MD  Cardiologist: Sinclair Grooms, MD   Chief Complaint  Patient presents with  . Follow-up    Hypertension    History of Present Illness:  GEOFREY David is a 80 y.o. male  who presents for  CAD, atrial fibrillation/atrial flutter, chronic kidney disease, permanent pacemaker, hypertension, and hyperlipidemia   He has some shortness of breath if he tries to the chest. Physical activity bending over. He has not noticed any swelling in his lower extremities. Dr. Justin Mend decrease the diuretic from 80 mg down to 40 mg furosemide daily. Eyes chest pain and palpitations.  Past Medical History:  Diagnosis Date  . Atrial fibrillation ([place])   . Chronic kidney disease, stage IV (severe) (HCC)    baseline creatinine 2-3  . DDD (degenerative disc disease)   . Diabetes mellitus without complication ([place])   . Hyperlipidemia   . Hypertension   . Melanoma ([place]) 2010   removed at [facility]  . Pacemaker   . PONV (postoperative nausea and vomiting)   . Second degree heart block     Past Surgical History:  Procedure Laterality Date  . AV FISTULA PLACEMENT Right 05/31/2014   Procedure: Right Arm Brachiocephalic ARTERIOVENOUS FISTULA CREATION  ;  Surgeon: Conrad , MD;  Location: [facility];  Service: Vascular;  Laterality: Right;  . CATARACT EXTRACTION W/ INTRAOCULAR LENS  IMPLANT, BILATERAL    . COLONOSCOPY    . EXCISION MELANOMA WITH SENTINEL LYMPH NODE BIOPSY Right 02/08/2016   Procedure: WIDE EXCISION RIGHT SHOULDER MELANOMA WITH RIGHT SENTINEL LYMPH NODE BIOPSY;  Surgeon: [name], MD;  Location: [facility];  Service: General;  Laterality: Right;  . LAMINECTOMY    . PACEMAKER INSERTION  01-20-2014   STJ Assurity dual chamber pacemaker implanted by Dr [name] for 2nd degree AV block  . PERMANENT PACEMAKER INSERTION N/A 01/20/2014   Procedure: PERMANENT PACEMAKER INSERTION;   Surgeon: Coralyn Mark, MD;  Location: [facility] CATH [facility];  Service: Cardiovascular;  Laterality: N/A;    Current Medications: Outpatient Medications Prior to Visit  Medication Sig Dispense Refill  . ACCU-CHEK AVIVA PLUS test strip     . aspirin EC 81 MG tablet Take 81 mg by mouth at bedtime.    . carvedilol (COREG) 25 MG tablet Take 1 tablet (25 mg total) by mouth 2 (two) times daily with a meal. 60 tablet 6  . fenofibrate 160 MG tablet Take 160 mg by mouth daily.    . Flaxseed, Linseed, (FLAXSEED OIL) 1000 MG CAPS Take 1 capsule by mouth 2 (two) times daily.     . furosemide (LASIX) 80 MG tablet Take 40 mg by mouth daily.     Marland David glimepiride (AMARYL) 4 MG tablet Take 4 mg by mouth 2 (two) times daily.     Marland David ipratropium (ATROVENT) 0.03 % nasal spray Place 2 sprays into both nostrils daily.     . tamsulosin (FLOMAX) 0.4 MG CAPS capsule Take 0.4 mg by mouth 3 (three) times a week. Monday, Wednesday and Friday (at bedtime)    . Lactobacillus Acid-Pectin (ACIDOPHILUS/PECTIN) CAPS Take by mouth.    James David 625 MG tablet Take 1 tablet by mouth 2 (two) times daily.  3   No facility-administered medications prior to visit.      Allergies:   Amlodipine besylate; Lisinopril; and Statins  Social History   Social History  . Marital status: Married    Spouse name: N/A  . Number of children: N/A  . Years of education: N/A   Social History Main Topics  . Smoking status: Never Smoker  . Smokeless tobacco: Never Used  . Alcohol use No  . Drug use: No  . Sexual activity: Not Asked   Other Topics Concern  . None   Social History Narrative   Lives in [place] with spouse.  Retired Theatre manager for [employer].     Family History:  The patient's family history includes Diabetes in his brother and mother; Heart attack in his father; Pulmonary embolism in his mother.   ROS:   Please see the history of present illness.    He has cramping in his feet. Some easy bruising and shortness of  breath with activity. There is also some generalized fatigue.  All other systems reviewed and are negative.   PHYSICAL EXAM:   VS:  BP (!) 156/84   Pulse 67   Ht 5\' 10"  (1.778 m)   Wt 195 lb 6.4 oz (88.6 kg)   BMI 28.04 kg/m    GEN: Well nourished, well developed, in no acute distress  HEENT: normal  Neck: no JVD, carotid bruits, or masses Cardiac: RRR; no murmurs, rubs, or gallops,no edema  Respiratory:  clear to auscultation bilaterally, normal work of breathing GI: soft, nontender, nondistended, + BS MS: no deformity or atrophy  Skin: warm and dry, no rash Neuro:  Alert and Oriented x 3, Strength and sensation are intact Psych: euthymic mood, full affect  Wt Readings from Last 3 Encounters:  05/23/16 195 lb 6.4 oz (88.6 kg)  03/13/16 195 lb 12.8 oz (88.8 kg)  02/08/16 200 lb (90.7 kg)      Studies/Labs Reviewed:   EKG:  EKG  Atrial tracking with ventricular pacing. Rate 67 bpm.  Recent Labs: 01/31/2016: BUN 48; Creatinine, Ser 4.20; Hemoglobin 13.6; Platelets 182; Potassium 4.9; Sodium 141   Lipid Panel No results found for: CHOL, TRIG, HDL, CHOLHDL, VLDL, LDLCALC, LDLDIRECT  Additional studies/ records that were reviewed today include:  Recent labs and may include basic metabolic panel with a creatinine was 4.2 potassium 4.9 and hemoglobin 13.6.    ASSESSMENT:    1. Chronic diastolic heart failure ([place])   2. Essential hypertension   3. Pacemaker   4. Atypical atrial flutter ([place])   5. Chronic kidney disease, stage IV (severe) ([place])   6. Hyperlipidemia      PLAN:  In order of problems listed above:  1. There is no evidence of volume overload on the current diuretic regimen. I agree with the dose and we'll continue to follow the patient clinically. 2. Systolic blood pressures mildly elevated. I recommended care with sodium intake, trying to keep it at 2 g /day. 3. Normal pacemaker function is noted on the EKG. 4. No clinical recurrence. 5. Followed by  Dr. Edrick Oh.    Medication Adjustments/Labs and Tests Ordered: Current medicines are reviewed at length with the patient today.  Concerns regarding medicines are outlined above.  Medication changes, Labs and Tests ordered today are listed in the Patient Instructions below. There are no Patient Instructions on file for this visit.   Signed, Sinclair Grooms, MD  05/23/2016 12:11 PM    [facility] [address], [place], [place]  [address] Phone: [phone]; Fax: [phone]

## 2016-05-23 NOTE — Patient Instructions (Signed)
Your physician recommends that you continue on your current medications as directed. Please refer to the Current Medication list given to you today. Your physician wants you to follow-up in: 1 YEAR WITH DR SMITH.  You will receive a reminder letter in the mail two months in advance. If you don't receive a letter, please call our office to schedule the follow-up appointment.  

## 2016-06-26 ENCOUNTER — Encounter: Payer: Medicare Other | Admitting: Nurse Practitioner

## 2016-06-26 NOTE — Progress Notes (Addendum)
Electrophysiology Office Note Date: 06/27/2016  ID:  James David, DOB October 10, 1935, MRN [id number]  PCP:  James Crutch, MD Primary Cardiologist: James David Electrophysiologist: Allred  CC: Pacemaker follow-up  James David is a 80 y.o. male seen today for Dr [name].  He presents today for routine electrophysiology followup.  Since last being seen in our clinic, the patient reports doing very well.  He denies chest pain, palpitations, dyspnea, PND, orthopnea, nausea, vomiting, dizziness, syncope, edema, weight gain, or early satiety.  Device History: STJ dual chamber PPM implanted 2015 for Mobitz II    Past Medical History:  Diagnosis Date  . Atrial fibrillation ([place])   . Chronic kidney disease, stage IV (severe) (HCC)    baseline creatinine 2-3  . DDD (degenerative disc disease)   . Diabetes mellitus without complication ([place])   . Hyperlipidemia   . Hypertension   . Melanoma ([place]) 2010   removed at [facility]  . Second degree heart block    a. s/p STJ dual chamber PPM - Dr [name]   Past Surgical History:  Procedure Laterality Date  . AV FISTULA PLACEMENT Right 05/31/2014   Procedure: Right Arm Brachiocephalic ARTERIOVENOUS FISTULA CREATION  ;  Surgeon: Conrad Merrifield, MD;  Location: [facility];  Service: Vascular;  Laterality: Right;  . CATARACT EXTRACTION W/ INTRAOCULAR LENS  IMPLANT, BILATERAL    . COLONOSCOPY    . EXCISION MELANOMA WITH SENTINEL LYMPH NODE BIOPSY Right 02/08/2016   Procedure: WIDE EXCISION RIGHT SHOULDER MELANOMA WITH RIGHT SENTINEL LYMPH NODE BIOPSY;  Surgeon: [name], MD;  Location: [facility];  Service: General;  Laterality: Right;  . LAMINECTOMY    . PERMANENT PACEMAKER INSERTION N/A 01/20/2014   STJ Assurity dual chamber pacemaker implanted by Dr [name] for 2nd degree AV block    Current Outpatient Prescriptions  Medication Sig Dispense Refill  . ACCU-CHEK AVIVA PLUS test strip     . acetaminophen (TYLENOL) 500 MG tablet Take 500 mg by mouth daily as needed.  (pain/headaches)    . aspirin EC 81 MG tablet Take 81 mg by mouth at bedtime.    . carvedilol (COREG) 25 MG tablet Take 1 tablet (25 mg total) by mouth 2 (two) times daily with a meal. 60 tablet 6  . Difluprednate 0.05 % EMUL Place 1 drop into the right eye daily.    . fenofibrate 160 MG tablet Take 160 mg by mouth daily.    . finasteride (PROSCAR) 5 MG tablet Take 5 mg by mouth daily.    . Flaxseed, Linseed, (FLAXSEED OIL) 1000 MG CAPS Take 1 capsule by mouth 2 (two) times daily.     . furosemide (LASIX) 80 MG tablet Take 40 mg by mouth daily.     Marland Kitchen glimepiride (AMARYL) 4 MG tablet Take 4 mg by mouth 2 (two) times daily.     Marland Kitchen ipratropium (ATROVENT) 0.03 % nasal spray Place 2 sprays into both nostrils daily.     . tamsulosin (FLOMAX) 0.4 MG CAPS capsule Take 0.4 mg by mouth 3 (three) times a week. Monday, Wednesday and Friday (at bedtime)     No current facility-administered medications for this visit.     Allergies:   Amlodipine besylate; Lisinopril; and Statins   Social History: Social History   Social History  . Marital status: Married    Spouse name: N/A  . Number of children: N/A  . Years of education: N/A   Occupational History  . Not on file.   Social  History Main Topics  . Smoking status: Never Smoker  . Smokeless tobacco: Never Used  . Alcohol use No  . Drug use: No  . Sexual activity: Not on file   Other Topics Concern  . Not on file   Social History Narrative   Lives in [place] with spouse.  Retired Theatre manager for [employer].    Family History: Family History  Problem Relation Age of Onset  . Pulmonary embolism Mother   . Diabetes Mother   . Heart attack Father   . Diabetes Brother      Review of Systems: All other systems reviewed and are otherwise negative except as noted above.   Physical Exam: VS:  BP (!) 158/84   Pulse 80   Ht 5\' 11"  (1.803 m)   Wt 196 lb 12.8 oz (89.3 kg)   SpO2 99%   BMI 27.45 kg/m  , BMI Body mass index is  27.45 kg/m.  GEN- The patient is elderly appearing, alert and oriented x 3 today.   HEENT: normocephalic, atraumatic; sclera clear, conjunctiva pink; hearing intact; oropharynx clear; neck supple  Lungs- Clear to ausculation bilaterally, normal work of breathing.  No wheezes, rales, rhonchi Heart- Regular rate and rhythm (paced) GI- soft, non-tender, non-distended, bowel sounds present  Extremities- no clubbing, cyanosis, or edema; AV fistula RUE MS- no significant deformity or atrophy Skin- warm and dry, no rash or lesion; PPM pocket well healed Psych- euthymic mood, full affect Neuro- strength and sensation are intact  PPM Interrogation- reviewed in detail today,  See PACEART report  EKG:  EKG is not ordered today.  Recent Labs: 01/31/2016: BUN 48; Creatinine, Ser 4.20; Hemoglobin 13.6; Platelets 182; Potassium 4.9; Sodium 141   Wt Readings from Last 3 Encounters:  06/27/16 196 lb 12.8 oz (89.3 kg)  05/23/16 195 lb 6.4 oz (88.6 kg)  03/13/16 195 lb 12.8 oz (88.8 kg)     Other studies Reviewed: Additional studies/ records that were reviewed today include: Dr [name] and Dr Thompson Caul office notes  Assessment and Plan:  1.  Mobitz II Normal PPM function RA sensing decreased slightly today.  Unipolar sensing better but with oversensing of ventricular signals. Left bipolar with sensitivity changed to 0.75mV Will follow I have discussed the available STJ firmware patch for cyberhacking with the patient today.  We have discussed the risks associated with the firmware upgrade as well as the theoretical risk for cyberhacking.  After an informed discussion, the patient has decided not to install the firmware patch at this time.   2.  Atrial flutter Burden by device interrogation <1%, longest episode <15 minutes CHADS2VASC is 4 Would need Warfarin with CKD if burden increases  3.  CKD Followed by nephrology  4.  Chronic diastolic heart failure Diuretics being managed by Dr  Justin Mend   Current medicines are reviewed at length with the patient today.   The patient does not have concerns regarding his medicines.  The following changes were made today:  none  Labs/ tests ordered today include: none No orders of the defined types were placed in this encounter.    Disposition:   Follow up with Delilah Shan, Dr James David as scheduled, Dr [name] 1 year    Signed, Chanetta Marshall, NP 06/27/2016 10:38 AM  [facility] [address] [place] [address] [phone] (office) [phone] (fax

## 2016-06-27 ENCOUNTER — Encounter: Payer: Self-pay | Admitting: Nurse Practitioner

## 2016-06-27 ENCOUNTER — Ambulatory Visit (INDEPENDENT_AMBULATORY_CARE_PROVIDER_SITE_OTHER): Payer: Medicare Other | Admitting: Podiatry

## 2016-06-27 ENCOUNTER — Encounter: Payer: Self-pay | Admitting: Internal Medicine

## 2016-06-27 ENCOUNTER — Ambulatory Visit (INDEPENDENT_AMBULATORY_CARE_PROVIDER_SITE_OTHER): Payer: Medicare Other | Admitting: Nurse Practitioner

## 2016-06-27 ENCOUNTER — Encounter: Payer: Self-pay | Admitting: Podiatry

## 2016-06-27 VITALS — BP 158/84 | HR 80 | Ht 71.0 in | Wt 196.8 lb

## 2016-06-27 VITALS — Ht 71.0 in | Wt 196.0 lb

## 2016-06-27 DIAGNOSIS — E0821 Diabetes mellitus due to underlying condition with diabetic nephropathy: Secondary | ICD-10-CM | POA: Diagnosis not present

## 2016-06-27 DIAGNOSIS — M79676 Pain in unspecified toe(s): Secondary | ICD-10-CM | POA: Diagnosis not present

## 2016-06-27 DIAGNOSIS — I4892 Unspecified atrial flutter: Secondary | ICD-10-CM

## 2016-06-27 DIAGNOSIS — I5032 Chronic diastolic (congestive) heart failure: Secondary | ICD-10-CM

## 2016-06-27 DIAGNOSIS — B351 Tinea unguium: Secondary | ICD-10-CM | POA: Diagnosis not present

## 2016-06-27 DIAGNOSIS — I441 Atrioventricular block, second degree: Secondary | ICD-10-CM | POA: Diagnosis not present

## 2016-06-27 NOTE — Patient Instructions (Signed)
Medication Instructions:   Your physician recommends that you continue on your current medications as directed. Please refer to the Current Medication list given to you today.    If you need a refill on your cardiac medications before your next appointment, please call your pharmacy.  Labwork: NONE ORDER TODAY    Testing/Procedures: NONE ORDER TODAY    Follow-Up:   Your physician wants you to follow-up in: [place] will receive a reminder letter in the mail two months in advance. If you don't receive a letter, please call our office to schedule the follow-up appointment.    Remote monitoring is used to monitor your Pacemaker of ICD from home. This monitoring reduces the number of office visits required to check your device to one time per year. It allows [place] to keep an eye on the functioning of your device to ensure it is working properly. You are scheduled for a device check from home on . 1/12/18You may send your transmission at any time that day. If you have a wireless device, the transmission will be sent automatically. After your physician reviews your transmission, you will receive a postcard with your next transmission date.     Any Other Special Instructions Will Be Listed Below (If Applicable).

## 2016-06-27 NOTE — Progress Notes (Signed)
Patient ID: James David, male   DOB: [birth year], 80 y.o.   MRN: [id number] HPI  Complaint:  Visit Type: Patient returns to my office for continued preventative foot care services. Complaint: Patient states" my nails have grown long and thick and become painful to walk and wear shoes" Patient has been diagnosed with DM with neuropathy.Marland Kitchen He presents for preventative foot care services. No changes to ROS  Podiatric Exam: Vascular: dorsalis pedis and posterior tibial pulses are negative. Capillary return is immediate. Temperature gradient is negative. Skin turgor WNL, bilateral swelling  Sensorium: Normal Semmes Weinstein monofilament test. Normal tactile sensation bilaterally.  Nail Exam: Pt has thick disfigured discolored nails with subungual debris noted bilateral entire nail hallux through fifth toenails Ulcer Exam: There is no evidence of ulcer or pre-ulcerative changes or infection. Orthopedic Exam: Muscle tone and strength are WNL. No limitations in general ROM. No crepitus or effusions noted. Foot type and digits show no abnormalities. Bony prominences are unremarkable. Skin: No Porokeratosis. No infection or ulcers  Diagnosis:  Tinea unguium, Pain in right toe, pain in left toes  Treatment & Plan Procedures and Treatment: Consent by patient was obtained for treatment procedures. The patient understood the discussion of treatment and procedures well. All questions were answered thoroughly reviewed. Debridement of mycotic and hypertrophic toenails, 1 through 5 bilateral and clearing of subungual debris. No ulceration, no infection noted.  Return Visit-Office Procedure: Patient instructed to return to the office for a follow up visit 3 months for continued evaluation and treatment.   Gardiner Barefoot DPM

## 2016-07-10 LAB — CUP PACEART INCLINIC DEVICE CHECK
Date Time Interrogation Session: 20171025151957
Implantable Lead Implant Date: 20150507
Implantable Lead Location: 753859
Implantable Lead Model: 1948
MDC IDC LEAD IMPLANT DT: 20150507
MDC IDC LEAD LOCATION: 753860
MDC IDC PG SERIAL: 7617100

## 2016-08-18 ENCOUNTER — Telehealth: Payer: Self-pay | Admitting: Hematology

## 2016-08-18 NOTE — Telephone Encounter (Signed)
Lvm advising appt 12/28 moved to 10/03/16 @ 1pm due to md pal.

## 2016-09-12 ENCOUNTER — Other Ambulatory Visit: Payer: Medicare Other

## 2016-09-12 ENCOUNTER — Ambulatory Visit: Payer: Medicare Other | Admitting: Hematology

## 2016-09-15 NOTE — Progress Notes (Signed)
Marland Kitchen    HEMATOLOGY/ONCOLOGY CONSULTATION NOTE  Date of Service:.03/13/2016  Patient Care Team: [name], MD as PCP - General (Family Medicine) Edrick Oh, MD as Consulting Physician (Nephrology)  CHIEF COMPLAINTS/PURPOSE OF CONSULTATION:  Melanoma   HISTORY OF PRESENTING ILLNESS:   James David is a wonderful 79 y.o. male who has been referred to [place] by Dr .Melinda Crutch, MD  for evaluation and management of newly diagnosed melanoma.  Patient has multiple medical co-morbids including afib, CKD stage IV, HTN, HLD, DM2, DDD, h/o left upper arm melanoma in 2010 treated with surgery at [facility]. he was monitored for 5 yrs at [facility].  He was seen in followup by his dermatologist Dr Allyn Kenner in 11/2015 and was noted to have 2 concerning lesions - 1st on the rt neck which revealed a BCC , nodular pattern, ulcerated. 2nd lesion on the rt posterior shoulder 1.5cm shave biopsy consistent with malignant melanoma, superficial spreading, 1.41mm thick, no no ulceration. Focally involved peripheral margins with melanoma in situ. Neg LVI neg tumor regression, mitotic index <1 (PT2A)(both biopsies done on 12/06/2015),  Additional rt lateral neck shave biopsy  On 12/12/2015 showed atypical junctional lentiginous melanocytic proliferation overlying melanocytc nevus. Rt upper back dysplastic nevus with moderate melanocytic atypia.  Patient eventually had a WLE of the rt shoulder melanoma with rt SLNB on 02/08/2016 which showed previous scar with no residual melanoma and 4 neg SLN.  He was referred to me for evaluated regarding adjuvant therapy indication for his malignant melanoma.  Patient notes no other acute focal symptoms suggestive of melanoma metastases at this time.  Weight has been stable. No headaches or FND   MEDICAL HISTORY:  Past Medical History:  Diagnosis Date  . Atrial fibrillation ([place])   . Chronic kidney disease, stage IV (severe) (HCC)    baseline creatinine 2-3  . DDD  (degenerative disc disease)   . Diabetes mellitus without complication ([place])   . Hyperlipidemia   . Hypertension   . Melanoma ([place]) 2010   removed at [facility]  . Second degree heart block    a. s/p STJ dual chamber PPM - Dr [name]    SURGICAL HISTORY: Past Surgical History:  Procedure Laterality Date  . AV FISTULA PLACEMENT Right 05/31/2014   Procedure: Right Arm Brachiocephalic ARTERIOVENOUS FISTULA CREATION  ;  Surgeon: Conrad Goshen, MD;  Location: [place];  Service: Vascular;  Laterality: Right;  . CATARACT EXTRACTION W/ INTRAOCULAR LENS  IMPLANT, BILATERAL    . COLONOSCOPY    . EXCISION MELANOMA WITH SENTINEL LYMPH NODE BIOPSY Right 02/08/2016   Procedure: WIDE EXCISION RIGHT SHOULDER MELANOMA WITH RIGHT SENTINEL LYMPH NODE BIOPSY;  Surgeon: [name], MD;  Location: [place];  Service: General;  Laterality: Right;  . LAMINECTOMY    . PERMANENT PACEMAKER INSERTION N/A 01/20/2014   STJ Assurity dual chamber pacemaker implanted by Dr [name] for 2nd degree AV block    SOCIAL HISTORY: Social History   Social History  . Marital status: Married    Spouse name: N/A  . Number of children: N/A  . Years of education: N/A   Occupational History  . Not on file.   Social History Main Topics  . Smoking status: Never Smoker  . Smokeless tobacco: Never Used  . Alcohol use No  . Drug use: No  . Sexual activity: Not on file   Other Topics Concern  . Not on file   Social History Narrative   Lives in [place]  with spouse.  Retired Theatre manager for [employer].    FAMILY HISTORY: Family History  Problem Relation Age of Onset  . Pulmonary embolism Mother   . Diabetes Mother   . Heart attack Father   . Diabetes Brother     ALLERGIES:  is allergic to amlodipine besylate; lisinopril; and statins.  MEDICATIONS:  Current Outpatient Prescriptions  Medication Sig Dispense Refill  . ACCU-CHEK AVIVA PLUS test strip     . acetaminophen (TYLENOL) 500 MG tablet Take 500 mg by mouth  daily as needed. (pain/headaches)    . aspirin EC 81 MG tablet Take 81 mg by mouth at bedtime.    . carvedilol (COREG) 25 MG tablet Take 1 tablet (25 mg total) by mouth 2 (two) times daily with a meal. 60 tablet 6  . Difluprednate 0.05 % EMUL Place 1 drop into the right eye daily.    . fenofibrate 160 MG tablet Take 160 mg by mouth daily.    . finasteride (PROSCAR) 5 MG tablet Take 5 mg by mouth daily.    . Flaxseed, Linseed, (FLAXSEED OIL) 1000 MG CAPS Take 1 capsule by mouth 2 (two) times daily.     . furosemide (LASIX) 80 MG tablet Take 40 mg by mouth daily.     Marland Kitchen glimepiride (AMARYL) 4 MG tablet Take 4 mg by mouth 2 (two) times daily.     Marland Kitchen ipratropium (ATROVENT) 0.03 % nasal spray Place 2 sprays into both nostrils daily.     . tamsulosin (FLOMAX) 0.4 MG CAPS capsule Take 0.4 mg by mouth 3 (three) times a week. Monday, Wednesday and Friday (at bedtime)     No current facility-administered medications for this visit.     REVIEW OF SYSTEMS:    10 Point review of Systems was done is negative except as noted above.  PHYSICAL EXAMINATION: ECOG PERFORMANCE STATUS: 2 - Symptomatic, <50% confined to bed  . Vitals:   03/13/16 1300  BP: (!) 183/72  Pulse: 68  Resp: 18  Temp: 97.7 F (36.5 C)   Filed Weights   03/13/16 1300  Weight: 195 lb 12.8 oz (88.8 kg)   .Body mass index is 28.09 kg/m.  GENERAL:alert, in no acute distress and comfortable SKIN: several nevi. Healed resection scar rt shoulder EYES: normal, conjunctiva are pink and non-injected, sclera clear OROPHARYNX:no exudate, no erythema and lips, buccal mucosa, and tongue normal  NECK: supple, no JVD, thyroid normal size, non-tender, without nodularity LYMPH:  no palpable lymphadenopathy in the cervical, axillary or inguinal LUNGS: clear to auscultation with normal respiratory effort HEART: irreg. no murmurs and no lower extremity edema ABDOMEN: abdomen soft, non-tender, normoactive bowel sounds  Musculoskeletal: no  cyanosis of digits and no clubbing  PSYCH: alert & oriented x 3 with fluent speech NEURO: no focal motor/sensory deficits  LABORATORY DATA:  I have reviewed the data as listed  . CBC Latest Ref Rng & Units 01/31/2016 05/31/2014 01/19/2014  WBC 4.0 - 10.5 K/uL 6.4 - 9.4  Hemoglobin 13.0 - 17.0 g/dL 13.6 14.3 15.3  Hematocrit 39.0 - 52.0 % 40.2 42.0 45.6  Platelets 150 - 400 K/uL 182 - 247.0    . CMP Latest Ref Rng & Units 01/31/2016 05/31/2014 01/21/2014  Glucose 65 - 99 mg/dL 108(H) 210(H) 121(H)  BUN 6 - 20 mg/dL 48(H) - 43(H)  Creatinine 0.61 - 1.24 mg/dL 4.20(H) - 3.37(H)  Sodium 135 - 145 mmol/L 141 138 141  Potassium 3.5 - 5.1 mmol/L 4.9 4.3 4.4  Chloride 101 -  111 mmol/L 113(H) - 107  CO2 22 - 32 mmol/L 20(L) - 20  Calcium 8.9 - 10.3 mg/dL 8.7(L) - 8.7     RADIOGRAPHIC STUDIES: I have personally reviewed the radiological images as listed and agreed with the findings in the report. No results found.  ASSESSMENT & PLAN:   80 yo male with h/o left shoulder melanoma resected in 2010 at [facility] now with   1) Rt posterior shoulder Stage IB (pT2A, pN0,MX) malignant melanoma s/p WLE (neg margins)0 and 4 negative SLN 2) Other atypical melanocytic lesions 3) Rt neck Basal cell carcinoma Plan -patient had recurrent early stage melanoma Stage IB. -there is no recommended adjuvant rx that is deemed useful/necessary at this time. -whole body imaging with PET/CT is not recommended in this situation in absence of concerning focal symptoms. -he will need close continued f/u with Dr Bobbye Charleston for regular skin screening. -counseled on need for sun protection. -f/u with PCP  4) Patient Active Problem List   Diagnosis Date Noted  . Melanoma of skin ([place]) 03/13/2016  . Atrial flutter ([place]) 06/26/2015  . Pacemaker 10/13/2014  . End stage renal disease ([place]) 07/15/2014  . Chronic diastolic heart failure ([place]) 03/24/2014  . Second-degree heart block 12/13/2013  . Chronic kidney disease, stage  IV (severe) ([place]) 12/13/2013  . Essential hypertension 12/13/2013  . Diabetes mellitus due to underlying condition with diabetic nephropathy ([place]) 12/13/2013  . Hyperlipidemia 12/13/2013   -f/u with PCP for mx of other medical co-morbids.  RTC with Dr Irene Limbo on an as needed basis if any new questions or concerns arise.  All of the patients questions were answered with apparent satisfaction. The patient knows to call the clinic with any problems, questions or concerns.  I spent 50 minutes counseling the patient face to face. The total time spent in the appointment was 60 minutes and more than 50% was on counseling and direct patient cares.    [name] Lone MD [place] AAHIVMS [facility] Hematology/Oncology Physician [facility]  (Office):       [phone] (Work cell):  [phone] (Fax):           [phone]

## 2016-09-19 ENCOUNTER — Encounter: Payer: Self-pay | Admitting: Podiatry

## 2016-09-19 ENCOUNTER — Ambulatory Visit (INDEPENDENT_AMBULATORY_CARE_PROVIDER_SITE_OTHER): Payer: Medicare Other | Admitting: Podiatry

## 2016-09-19 VITALS — Ht 71.0 in | Wt 196.0 lb

## 2016-09-19 DIAGNOSIS — E0821 Diabetes mellitus due to underlying condition with diabetic nephropathy: Secondary | ICD-10-CM

## 2016-09-19 DIAGNOSIS — B351 Tinea unguium: Secondary | ICD-10-CM

## 2016-09-19 DIAGNOSIS — L84 Corns and callosities: Secondary | ICD-10-CM

## 2016-09-19 NOTE — Progress Notes (Signed)
Patient ID: James David, male   DOB: 11/10/1935, 80 y.o.   MRN: [id number] HPI  Complaint:  Visit Type: Patient returns to my office for continued preventative foot care services. Complaint: Patient states" my nails have grown long and thick and become painful to walk and wear shoes" Patient has been diagnosed with DM with neuropathy.. He presents for preventative foot care services. No changes to ROS.  Patient says he has developed a corn on his fifth toe right foot.  Podiatric Exam: Vascular: dorsalis pedis and posterior tibial pulses are negative. Capillary return is immediate. Temperature gradient is negative. Skin turgor WNL, bilateral swelling  Sensorium: Normal Semmes Weinstein monofilament test. Normal tactile sensation bilaterally.  Nail Exam: Pt has thick disfigured discolored nails with subungual debris noted bilateral entire nail hallux through fifth toenails Ulcer Exam: There is no evidence of ulcer or pre-ulcerative changes or infection. Orthopedic Exam: Muscle tone and strength are WNL. No limitations in general ROM. No crepitus or effusions noted. Foot type and digits show no abnormalities. Bony prominences are unremarkable. Skin: No Porokeratosis. No infection or ulcers  Diagnosis:  Tinea unguium, Pain in right toe, pain in left toes  Treatment & Plan Procedures and Treatment: Consent by patient was obtained for treatment procedures. The patient understood the discussion of treatment and procedures well. All questions were answered thoroughly reviewed. Debridement of mycotic and hypertrophic toenails, 1 through 5 bilateral and clearing of subungual debris. No ulceration, no infection noted. Debride 5th toe corn.  Right leg was active spasming  during treatment.   Return Visit-Office Procedure: Patient instructed to return to the office for a follow up visit 3 months for continued evaluation and treatment.   Seraphim Affinito DPM 

## 2016-09-27 ENCOUNTER — Ambulatory Visit (INDEPENDENT_AMBULATORY_CARE_PROVIDER_SITE_OTHER): Payer: Medicare Other | Admitting: *Deleted

## 2016-09-27 ENCOUNTER — Telehealth: Payer: Self-pay | Admitting: Cardiology

## 2016-09-27 DIAGNOSIS — I441 Atrioventricular block, second degree: Secondary | ICD-10-CM | POA: Diagnosis not present

## 2016-09-27 NOTE — Telephone Encounter (Signed)
Spoke with pt and reminded pt of remote transmission that is due today. Pt verbalized understanding.   

## 2016-09-30 NOTE — Progress Notes (Signed)
Remote pacemaker transmission.   

## 2016-10-03 ENCOUNTER — Ambulatory Visit: Payer: Medicare Other | Admitting: Hematology

## 2016-10-03 ENCOUNTER — Encounter: Payer: Self-pay | Admitting: *Deleted

## 2016-10-03 ENCOUNTER — Telehealth: Payer: Self-pay | Admitting: Hematology

## 2016-10-03 ENCOUNTER — Other Ambulatory Visit: Payer: Medicare Other

## 2016-10-03 NOTE — Progress Notes (Signed)
Patient would like to reschedule due to weather. Inbasket sent to schedulers.

## 2016-10-03 NOTE — Telephone Encounter (Signed)
Patient called to cancel his appointment for today due to inclement weather and needs to reschedule for sometime in March

## 2016-10-04 ENCOUNTER — Telehealth: Payer: Self-pay | Admitting: *Deleted

## 2016-10-04 NOTE — Telephone Encounter (Signed)
Per 1/18 staff message I have attempted to contact the patient to reschedule appts. I attempted to call both phone numbers listed in the chart, no answer.

## 2016-10-09 ENCOUNTER — Encounter: Payer: Self-pay | Admitting: Cardiology

## 2016-10-09 ENCOUNTER — Telehealth: Payer: Self-pay | Admitting: Hematology

## 2016-10-09 LAB — CUP PACEART REMOTE DEVICE CHECK
Battery Remaining Longevity: 119 mo
Battery Remaining Percentage: 95.5 %
Brady Statistic AP VS Percent: 1 %
Brady Statistic AS VP Percent: 99 %
Brady Statistic AS VS Percent: 1 %
Date Time Interrogation Session: 20180112162441
Implantable Lead Implant Date: 20150507
Implantable Lead Location: 753859
Implantable Lead Location: 753860
Lead Channel Impedance Value: 390 Ohm
Lead Channel Pacing Threshold Amplitude: 0.75 V
Lead Channel Pacing Threshold Pulse Width: 0.4 ms
Lead Channel Sensing Intrinsic Amplitude: 0.7 mV
Lead Channel Setting Pacing Amplitude: 2.5 V
Lead Channel Setting Pacing Pulse Width: 0.4 ms
MDC IDC LEAD IMPLANT DT: 20150507
MDC IDC MSMT BATTERY VOLTAGE: 2.99 V
MDC IDC MSMT LEADCHNL RA PACING THRESHOLD AMPLITUDE: 0.75 V
MDC IDC MSMT LEADCHNL RA PACING THRESHOLD PULSEWIDTH: 0.4 ms
MDC IDC MSMT LEADCHNL RV IMPEDANCE VALUE: 750 Ohm
MDC IDC MSMT LEADCHNL RV SENSING INTR AMPL: 8.3 mV
MDC IDC PG IMPLANT DT: 20150507
MDC IDC SET LEADCHNL RA PACING AMPLITUDE: 2 V
MDC IDC SET LEADCHNL RV SENSING SENSITIVITY: 2 mV
MDC IDC STAT BRADY AP VP PERCENT: 1 %
MDC IDC STAT BRADY RA PERCENT PACED: 1 %
MDC IDC STAT BRADY RV PERCENT PACED: 99 %
Pulse Gen Model: 2240
Pulse Gen Serial Number: 7617100

## 2016-10-09 NOTE — Telephone Encounter (Signed)
Left message for patient re 2/1 lab/fu. Rescheduled due to inclement weather per staff message from desk nurse.

## 2016-10-17 ENCOUNTER — Ambulatory Visit (HOSPITAL_BASED_OUTPATIENT_CLINIC_OR_DEPARTMENT_OTHER): Payer: Medicare Other | Admitting: Hematology

## 2016-10-17 ENCOUNTER — Other Ambulatory Visit (HOSPITAL_BASED_OUTPATIENT_CLINIC_OR_DEPARTMENT_OTHER): Payer: Medicare Other

## 2016-10-17 ENCOUNTER — Telehealth: Payer: Self-pay | Admitting: *Deleted

## 2016-10-17 ENCOUNTER — Encounter: Payer: Self-pay | Admitting: Hematology

## 2016-10-17 VITALS — BP 187/69 | HR 80 | Temp 98.0°F | Resp 18 | Ht 71.0 in | Wt 196.2 lb

## 2016-10-17 DIAGNOSIS — E119 Type 2 diabetes mellitus without complications: Secondary | ICD-10-CM

## 2016-10-17 DIAGNOSIS — N184 Chronic kidney disease, stage 4 (severe): Secondary | ICD-10-CM | POA: Diagnosis not present

## 2016-10-17 DIAGNOSIS — C439 Malignant melanoma of skin, unspecified: Secondary | ICD-10-CM | POA: Diagnosis not present

## 2016-10-17 DIAGNOSIS — C4362 Malignant melanoma of left upper limb, including shoulder: Secondary | ICD-10-CM

## 2016-10-17 LAB — CBC & DIFF AND RETIC
BASO%: 0.1 % (ref 0.0–2.0)
Basophils Absolute: 0 10*3/uL (ref 0.0–0.1)
EOS ABS: 0.2 10*3/uL (ref 0.0–0.5)
EOS%: 2.8 % (ref 0.0–7.0)
HCT: 34.4 % — ABNORMAL LOW (ref 38.4–49.9)
HEMOGLOBIN: 12 g/dL — AB (ref 13.0–17.1)
Immature Retic Fract: 8.2 % (ref 3.00–10.60)
LYMPH%: 11.6 % — ABNORMAL LOW (ref 14.0–49.0)
MCH: 30.2 pg (ref 27.2–33.4)
MCHC: 34.9 g/dL (ref 32.0–36.0)
MCV: 86.6 fL (ref 79.3–98.0)
MONO#: 0.5 10*3/uL (ref 0.1–0.9)
MONO%: 5.7 % (ref 0.0–14.0)
NEUT%: 79.8 % — ABNORMAL HIGH (ref 39.0–75.0)
NEUTROS ABS: 6.6 10*3/uL — AB (ref 1.5–6.5)
Platelets: 198 10*3/uL (ref 140–400)
RBC: 3.97 10*6/uL — AB (ref 4.20–5.82)
RDW: 13.3 % (ref 11.0–14.6)
RETIC %: 1.84 % — AB (ref 0.80–1.80)
Retic Ct Abs: 73.05 10*3/uL (ref 34.80–93.90)
WBC: 8.3 10*3/uL (ref 4.0–10.3)
lymph#: 1 10*3/uL (ref 0.9–3.3)

## 2016-10-17 LAB — COMPREHENSIVE METABOLIC PANEL
ALT: 13 U/L (ref 0–55)
AST: 8 U/L (ref 5–34)
Albumin: 2.9 g/dL — ABNORMAL LOW (ref 3.5–5.0)
Alkaline Phosphatase: 95 U/L (ref 40–150)
Anion Gap: 10 mEq/L (ref 3–11)
BUN: 71 mg/dL — ABNORMAL HIGH (ref 7.0–26.0)
CHLORIDE: 111 meq/L — AB (ref 98–109)
CO2: 18 meq/L — AB (ref 22–29)
Calcium: 8.9 mg/dL (ref 8.4–10.4)
Creatinine: 5.9 mg/dL (ref 0.7–1.3)
EGFR: 8 mL/min/{1.73_m2} — ABNORMAL LOW (ref >90)
GLUCOSE: 197 mg/dL — AB (ref 70–140)
POTASSIUM: 5.1 meq/L (ref 3.5–5.1)
SODIUM: 138 meq/L (ref 136–145)
Total Bilirubin: 0.24 mg/dL (ref 0.20–1.20)
Total Protein: 6.2 g/dL — ABNORMAL LOW (ref 6.4–8.3)

## 2016-10-17 LAB — LACTATE DEHYDROGENASE: LDH: 177 U/L (ref 125–245)

## 2016-10-17 NOTE — Telephone Encounter (Signed)
LVM with patient to return call to discuss CMET results.  Per Dr. Irene Limbo, pt needs to be evaluated by PCP and possible nephrology referral.   Pt returned call.  Discussed CMET results/rising creatinine of 5.9 with patient.  Pt states he has apt with nephrologist at [facility] on Tuesday 2/6.  Pt stated labs are always drawn on visit with nephrologist, but will inform nephrologist of results.  Pt thankful for call.

## 2016-10-17 NOTE — Progress Notes (Signed)
Marland Kitchen    HEMATOLOGY/ONCOLOGY CONSULTATION NOTE  Date of Service:.10/17/2016  Patient Care Team: [name], MD as PCP - General (Family Medicine) Edrick Oh, MD as Consulting Physician (Nephrology)  CHIEF COMPLAINTS/PURPOSE OF CONSULTATION:  F/u for Melanoma   HISTORY OF PRESENTING ILLNESS:   James David is a wonderful 81 y.o. male who has been referred to [place] by Dr .Melinda Crutch, MD  for evaluation and management of newly diagnosed melanoma.  Patient has multiple medical co-morbids including afib, CKD stage IV, HTN, HLD, DM2, DDD, h/o left upper arm melanoma in 2010 treated with surgery at [facility]. he was monitored for 5 yrs at [facility].  He was seen in followup by his dermatologist Dr Allyn Kenner in 11/2015 and was noted to have 2 concerning lesions - 1st on the rt neck which revealed a BCC , nodular pattern, ulcerated. 2nd lesion on the rt posterior shoulder 1.5cm shave biopsy consistent with malignant melanoma, superficial spreading, 1.19mm thick, no no ulceration. Focally involved peripheral margins with melanoma in situ. Neg LVI neg tumor regression, mitotic index <1 (PT2A)(both biopsies done on 12/06/2015),  Additional rt lateral neck shave biopsy  On 12/12/2015 showed atypical junctional lentiginous melanocytic proliferation overlying melanocytc nevus. Rt upper back dysplastic nevus with moderate melanocytic atypia.  Patient eventually had a WLE of the rt shoulder melanoma with rt SLNB on 02/08/2016 which showed previous scar with no residual melanoma and 4 neg SLN.  He was referred to me for evaluated regarding adjuvant therapy indication for his malignant melanoma.  Patient notes no other acute focal symptoms suggestive of melanoma metastases at this time.  Weight has been stable. No headaches or FND  INTERVAL HISTORY  Patient is here for his scheduled 6 month f/u for malignant melanoma. He notes no new nodules or pigment changes at this time site of his rt upper back  surgery. No other concerning new lesions. Has been following with is dermatologist Dr [name] for skin screening and was last evaluated in Nov 2017 and notes that he had a few skin spots removed but was not noted to have any new skin cancers/melanoma. No concerning new focal symptoms. No headaches/FND/CP/SOB/abdominal pain or focal bone pains.  MEDICAL HISTORY:  Past Medical History:  Diagnosis Date  . Atrial fibrillation ([place])   . Chronic kidney disease, stage IV (severe) (HCC)    baseline creatinine 2-3  . DDD (degenerative disc disease)   . Diabetes mellitus without complication ([place])   . Hyperlipidemia   . Hypertension   . Melanoma ([place]) 2010   removed at [facility]  . Second degree heart block    a. s/p STJ dual chamber PPM - Dr [name]    SURGICAL HISTORY: Past Surgical History:  Procedure Laterality Date  . AV FISTULA PLACEMENT Right 05/31/2014   Procedure: Right Arm Brachiocephalic ARTERIOVENOUS FISTULA CREATION  ;  Surgeon: Conrad Robinhood, MD;  Location: [place];  Service: Vascular;  Laterality: Right;  . CATARACT EXTRACTION W/ INTRAOCULAR LENS  IMPLANT, BILATERAL    . COLONOSCOPY    . EXCISION MELANOMA WITH SENTINEL LYMPH NODE BIOPSY Right 02/08/2016   Procedure: WIDE EXCISION RIGHT SHOULDER MELANOMA WITH RIGHT SENTINEL LYMPH NODE BIOPSY;  Surgeon: [name], MD;  Location: [place];  Service: General;  Laterality: Right;  . LAMINECTOMY    . PERMANENT PACEMAKER INSERTION N/A 01/20/2014   STJ Assurity dual chamber pacemaker implanted by Dr [name] for 2nd degree AV block    SOCIAL HISTORY: Social History  Social History  . Marital status: Married    Spouse name: N/A  . Number of children: N/A  . Years of education: N/A   Occupational History  . Not on file.   Social History Main Topics  . Smoking status: Never Smoker  . Smokeless tobacco: Never Used  . Alcohol use No  . Drug use: No  . Sexual activity: Not on file   Other Topics Concern  . Not on file   Social History  Narrative   Lives in [place] with spouse.  Retired Theatre manager for [employer].    FAMILY HISTORY: Family History  Problem Relation Age of Onset  . Pulmonary embolism Mother   . Diabetes Mother   . Heart attack Father   . Diabetes Brother     ALLERGIES:  is allergic to amlodipine besylate; lisinopril; and statins.  MEDICATIONS:  Current Outpatient Prescriptions  Medication Sig Dispense Refill  . ACCU-CHEK AVIVA PLUS test strip     . acetaminophen (TYLENOL) 500 MG tablet Take 500 mg by mouth daily as needed. (pain/headaches)    . aspirin EC 81 MG tablet Take 81 mg by mouth at bedtime.    . carvedilol (COREG) 25 MG tablet Take 1 tablet (25 mg total) by mouth 2 (two) times daily with a meal. 60 tablet 6  . fenofibrate 160 MG tablet Take 160 mg by mouth daily.    . finasteride (PROSCAR) 5 MG tablet Take 5 mg by mouth daily.    . furosemide (LASIX) 80 MG tablet Take 40 mg by mouth daily.     Marland Kitchen glimepiride (AMARYL) 4 MG tablet Take 4 mg by mouth 2 (two) times daily.     Marland Kitchen ipratropium (ATROVENT) 0.03 % nasal spray Place 2 sprays into both nostrils daily.     . tamsulosin (FLOMAX) 0.4 MG CAPS capsule Take 0.4 mg by mouth 3 (three) times a week. Monday, Wednesday and Friday (at bedtime)     No current facility-administered medications for this visit.     REVIEW OF SYSTEMS:    10 Point review of Systems was done is negative except as noted above.  PHYSICAL EXAMINATION: ECOG PERFORMANCE STATUS: 2 - Symptomatic, <50% confined to bed  . Vitals:   10/17/16 1335  BP: (!) 187/69  Pulse: 80  Resp: 18  Temp: 98 F (36.7 C)   Filed Weights   10/17/16 1335  Weight: 196 lb 3.2 oz (89 kg)   .Body mass index is 27.36 kg/m.  GENERAL:alert, in no acute distress and comfortable SKIN: several nevi. Healed resection scar rt shoulder EYES: normal, conjunctiva are pink and non-injected, sclera clear OROPHARYNX:no exudate, no erythema and lips, buccal mucosa, and tongue normal    NECK: supple, no JVD, thyroid normal size, non-tender, without nodularity LYMPH:  no palpable lymphadenopathy in the cervical, axillary or inguinal LUNGS: clear to auscultation with normal respiratory effort HEART: irreg. no murmurs and no lower extremity edema ABDOMEN: abdomen soft, non-tender, normoactive bowel sounds  Musculoskeletal: no cyanosis of digits and no clubbing  PSYCH: alert & oriented x 3 with fluent speech NEURO: no focal motor/sensory deficits  LABORATORY DATA:  I have reviewed the data as listed  . CBC Latest Ref Rng & Units 10/17/2016 01/31/2016 05/31/2014  WBC 4.0 - 10.3 10e3/uL 8.3 6.4 -  Hemoglobin 13.0 - 17.1 g/dL 12.0(L) 13.6 14.3  Hematocrit 38.4 - 49.9 % 34.4(L) 40.2 42.0  Platelets 140 - 400 10e3/uL 198 182 -    . CMP Latest Ref Rng &  Units 10/17/2016 01/31/2016 05/31/2014  Glucose 70 - 140 mg/dl 197(H) 108(H) 210(H)  BUN 7.0 - 26.0 mg/dL 71.0(H) 48(H) -  Creatinine 0.7 - 1.3 mg/dL 5.9(HH) 4.20(H) -  Sodium 136 - 145 mEq/L 138 141 138  Potassium 3.5 - 5.1 mEq/L 5.1 4.9 4.3  Chloride 101 - 111 mmol/L - 113(H) -  CO2 22 - 29 mEq/L 18(L) 20(L) -  Calcium 8.4 - 10.4 mg/dL 8.9 8.7(L) -  Total Protein 6.4 - 8.3 g/dL 6.2(L) - -  Total Bilirubin 0.20 - 1.20 mg/dL 0.24 - -  Alkaline Phos 40 - 150 U/L 95 - -  AST 5 - 34 U/L 8 - -  ALT 0 - 55 U/L 13 - -   . Lab Results  Component Value Date   LDH 177 10/17/2016      RADIOGRAPHIC STUDIES: I have personally reviewed the radiological images as listed and agreed with the findings in the report. No results found.  ASSESSMENT & PLAN:   81 yo male with h/o left shoulder melanoma resected in 2010 at [facility] now with   1) Rt posterior shoulder Stage IB (pT2A, pN0,MX) malignant melanoma s/p WLE (neg margins) and 4 negative SLN in 01/2016 2) Other atypical melanocytic lesions 3) Rt neck Basal cell carcinoma Plan -no clinical indication of recurrent melanoma at this time. Normal LDH levels -close continued f/u with  Dr Bobbye Charleston for regular skin screening. -counseled on need for sun protection. -f/u with PCP -we will sign off at this time and be available for new concerns/questions.  4) Patient Active Problem List   Diagnosis Date Noted  . Melanoma of skin ([place]) 03/13/2016  . Atrial flutter ([place]) 06/26/2015  . Pacemaker 10/13/2014  . End stage renal disease ([place]) 07/15/2014  . Chronic diastolic heart failure ([place]) 03/24/2014  . Second-degree heart block 12/13/2013  . Chronic kidney disease, stage IV (severe) ([place]) 12/13/2013  . Essential hypertension 12/13/2013  . Diabetes mellitus due to underlying condition with diabetic nephropathy ([place]) 12/13/2013  . Hyperlipidemia 12/13/2013   -f/u with PCP for mx of other medical co-morbids. His creatinine was a little worse any baseline at 5.9 with minimal hyperkalemia @ 5.1 and decreasing bicarb at 18 --- my RN called and informed patients PCP's office of lab findings and need for close f/u of patient renal parameters.  RTC with Dr Irene Limbo on an as needed basis if any new questions or concerns arise.  All of the patients questions were answered with apparent satisfaction. The patient knows to call the clinic with any problems, questions or concerns.  I spent 20 minutes counseling the patient face to face. The total time spent in the appointment was 20 minutes and more than 50% was on counseling and direct patient cares.    [name] Lone MD [place] AAHIVMS [facility] Hematology/Oncology Physician [facility]  (Office):       [phone] (Work cell):  [phone] (Fax):           [phone]

## 2016-12-04 ENCOUNTER — Emergency Department (HOSPITAL_COMMUNITY)
Admission: EM | Admit: 2016-12-04 | Discharge: 2016-12-04 | Disposition: A | Discharge status: Home / Self Care | Payer: Medicare Other | Attending: Emergency Medicine | Admitting: Emergency Medicine

## 2016-12-04 ENCOUNTER — Emergency Department (HOSPITAL_COMMUNITY): Payer: Medicare Other

## 2016-12-04 ENCOUNTER — Encounter (HOSPITAL_COMMUNITY): Payer: Self-pay | Admitting: Emergency Medicine

## 2016-12-04 DIAGNOSIS — N186 End stage renal disease: Secondary | ICD-10-CM | POA: Diagnosis not present

## 2016-12-04 DIAGNOSIS — I5032 Chronic diastolic (congestive) heart failure: Secondary | ICD-10-CM | POA: Diagnosis not present

## 2016-12-04 DIAGNOSIS — N179 Acute kidney failure, unspecified: Secondary | ICD-10-CM

## 2016-12-04 DIAGNOSIS — Z950 Presence of cardiac pacemaker: Secondary | ICD-10-CM | POA: Diagnosis not present

## 2016-12-04 DIAGNOSIS — Z85820 Personal history of malignant melanoma of skin: Secondary | ICD-10-CM | POA: Insufficient documentation

## 2016-12-04 DIAGNOSIS — Z7982 Long term (current) use of aspirin: Secondary | ICD-10-CM | POA: Diagnosis not present

## 2016-12-04 DIAGNOSIS — I132 Hypertensive heart and chronic kidney disease with heart failure and with stage 5 chronic kidney disease, or end stage renal disease: Secondary | ICD-10-CM | POA: Diagnosis not present

## 2016-12-04 DIAGNOSIS — N189 Chronic kidney disease, unspecified: Secondary | ICD-10-CM

## 2016-12-04 DIAGNOSIS — Z7984 Long term (current) use of oral hypoglycemic drugs: Secondary | ICD-10-CM | POA: Insufficient documentation

## 2016-12-04 DIAGNOSIS — Z79899 Other long term (current) drug therapy: Secondary | ICD-10-CM | POA: Insufficient documentation

## 2016-12-04 DIAGNOSIS — E1122 Type 2 diabetes mellitus with diabetic chronic kidney disease: Secondary | ICD-10-CM | POA: Insufficient documentation

## 2016-12-04 LAB — URINALYSIS, ROUTINE W REFLEX MICROSCOPIC
Bacteria, UA: NONE SEEN
Bilirubin Urine: NEGATIVE
HGB URINE DIPSTICK: NEGATIVE
Ketones, ur: NEGATIVE mg/dL
Leukocytes, UA: NEGATIVE
NITRITE: NEGATIVE
PH: 6 (ref 5.0–8.0)
PROTEIN: 100 mg/dL — AB
Specific Gravity, Urine: 1.011 (ref 1.005–1.030)
Squamous Epithelial / LPF: NONE SEEN

## 2016-12-04 LAB — COMPREHENSIVE METABOLIC PANEL
ALBUMIN: 2.9 g/dL — AB (ref 3.5–5.0)
ALT: 14 U/L — ABNORMAL LOW (ref 17–63)
ANION GAP: 9 (ref 5–15)
AST: 10 U/L — ABNORMAL LOW (ref 15–41)
Alkaline Phosphatase: 77 U/L (ref 38–126)
BILIRUBIN TOTAL: 0.4 mg/dL (ref 0.3–1.2)
BUN: 81 mg/dL — ABNORMAL HIGH (ref 6–20)
CALCIUM: 8.7 mg/dL — AB (ref 8.9–10.3)
CO2: 18 mmol/L — ABNORMAL LOW (ref 22–32)
Chloride: 111 mmol/L (ref 101–111)
Creatinine, Ser: 7 mg/dL — ABNORMAL HIGH (ref 0.61–1.24)
GFR, EST AFRICAN AMERICAN: 8 mL/min — AB (ref >60)
GFR, EST NON AFRICAN AMERICAN: 7 mL/min — AB (ref >60)
Glucose, Bld: 151 mg/dL — ABNORMAL HIGH (ref 65–99)
POTASSIUM: 4.9 mmol/L (ref 3.5–5.1)
Sodium: 138 mmol/L (ref 135–145)
TOTAL PROTEIN: 5.8 g/dL — AB (ref 6.5–8.1)

## 2016-12-04 LAB — CBC WITH DIFFERENTIAL/PLATELET
BASOS ABS: 0 10*3/uL (ref 0.0–0.1)
Basophils Relative: 0 %
EOS PCT: 2 %
Eosinophils Absolute: 0.1 10*3/uL (ref 0.0–0.7)
HEMATOCRIT: 35.4 % — AB (ref 39.0–52.0)
HEMOGLOBIN: 11.6 g/dL — AB (ref 13.0–17.0)
LYMPHS ABS: 0.9 10*3/uL (ref 0.7–4.0)
LYMPHS PCT: 12 %
MCH: 29.7 pg (ref 26.0–34.0)
MCHC: 32.8 g/dL (ref 30.0–36.0)
MCV: 90.8 fL (ref 78.0–100.0)
MONO ABS: 0.5 10*3/uL (ref 0.1–1.0)
MONOS PCT: 6 %
Neutro Abs: 6.5 10*3/uL (ref 1.7–7.7)
Neutrophils Relative %: 80 %
Platelets: 192 10*3/uL (ref 150–400)
RBC: 3.9 MIL/uL — AB (ref 4.22–5.81)
RDW: 13.9 % (ref 11.5–15.5)
WBC: 8.1 10*3/uL (ref 4.0–10.5)

## 2016-12-04 NOTE — Discharge Instructions (Signed)
Follow-up with Dr. Justin Mend.

## 2016-12-04 NOTE — ED Provider Notes (Signed)
[facility] DEPT Provider Note   CSN: [id number] Arrival date & time: 12/04/16  1131     History   Chief Complaint Chief Complaint  Patient presents with  . Abnormal Lab    HPI James David is a 81 y.o. male.  HPI Patient sent in by nephrology for reported worsening renal function. Reportedly Dr. Justin Mend cinnamon for a creatinine that was 7.9. Does have a history chronic renal sufficiency. States he was told to come in because it could be an obstruction. He has a dialysis graft. Has bad kidneys due to high blood pressure and diabetes. He is not currently on dialysis. Patient states he feels fine. No difficulty urinating. States he urinated on the way here. No fevers or chills. No lightheadedness. No chest pain or trouble breathing.   Past Medical History:  Diagnosis Date  . Atrial fibrillation ([place])   . Chronic kidney disease, stage IV (severe) (H[facility])    baseline creatinine 2-3  . DDD (degenerative disc disease)   . Diabetes mellitus without complication ([place])   . Hyperlipidemia   . Hypertension   . Melanoma ([place]) 2010   removed at [facility]  . Second degree heart block    a. s/p STJ dual chamber PPM - Dr Allred    Patient Active Problem List   Diagnosis Date Noted  . Melanoma of skin ([place]) 03/13/2016  . Atrial flutter ([place]) 06/26/2015  . Pacemaker 10/13/2014  . End stage renal disease ([place]) 07/15/2014  . Chronic diastolic heart failure ([place]) 03/24/2014  . Second-degree heart block 12/13/2013  . Chronic kidney disease, stage IV (severe) ([place]) 12/13/2013  . Essential hypertension 12/13/2013  . Diabetes mellitus due to underlying condition with diabetic nephropathy ([place]) 12/13/2013  . Hyperlipidemia 12/13/2013    Past Surgical History:  Procedure Laterality Date  . AV FISTULA PLACEMENT Right 05/31/2014   Procedure: Right Arm Brachiocephalic ARTERIOVENOUS FISTULA CREATION  ;  Surgeon: Conrad Branson West, MD;  Location: [place];  Service: Vascular;  Laterality: Right;  . CATARACT  EXTRACTION W/ INTRAOCULAR LENS  IMPLANT, BILATERAL    . COLONOSCOPY    . EXCISION MELANOMA WITH SENTINEL LYMPH NODE BIOPSY Right 02/08/2016   Procedure: WIDE EXCISION RIGHT SHOULDER MELANOMA WITH RIGHT SENTINEL LYMPH NODE BIOPSY;  Surgeon: [name], MD;  Location: [place];  Service: General;  Laterality: Right;  . LAMINECTOMY    . PERMANENT PACEMAKER INSERTION N/A 01/20/2014   STJ Assurity dual chamber pacemaker implanted by Dr [name] for 2nd degree AV block       Home Medications    Prior to Admission medications   Medication Sig Start Date End Date Taking? Authorizing Provider  ACCU-CHEK AVIVA PLUS test strip  02/08/14   Historical Provider, MD  acetaminophen (TYLENOL) 500 MG tablet Take 500 mg by mouth daily as needed. (pain/headaches)    Historical Provider, MD  aspirin EC 81 MG tablet Take 81 mg by mouth at bedtime.    Historical Provider, MD  carvedilol (COREG) 25 MG tablet Take 1 tablet (25 mg total) by mouth 2 (two) times daily with a meal. 11/23/15   Belva Crome, MD  fenofibrate 160 MG tablet Take 160 mg by mouth daily.    Historical Provider, MD  finasteride (PROSCAR) 5 MG tablet Take 5 mg by mouth daily. 04/29/16   Historical Provider, MD  furosemide (LASIX) 80 MG tablet Take 40 mg by mouth daily.     Historical Provider, MD  glimepiride (AMARYL) 4 MG tablet Take 4 mg by  mouth 2 (two) times daily.     Historical Provider, MD  ipratropium (ATROVENT) 0.03 % nasal spray Place 2 sprays into both nostrils daily.     Historical Provider, MD  tamsulosin (FLOMAX) 0.4 MG CAPS capsule Take 0.4 mg by mouth 3 (three) times a week. Monday, Wednesday and Friday (at bedtime)    Historical Provider, MD    Family History Family History  Problem Relation Age of Onset  . Pulmonary embolism Mother   . Diabetes Mother   . Heart attack Father   . Diabetes Brother     Social History Social History  Substance Use Topics  . Smoking status: Never Smoker  . Smokeless tobacco: Never Used  .  Alcohol use No     Allergies   Amlodipine besylate; Lisinopril; and Statins   Review of Systems Review of Systems  Constitutional: Negative for appetite change.  HENT: Negative for congestion.   Respiratory: Negative for shortness of breath.   Cardiovascular: Negative for chest pain.  Gastrointestinal: Negative for abdominal pain.  Genitourinary: Negative for dysuria.  Musculoskeletal: Negative for back pain.  Neurological: Negative for light-headedness.  Hematological: Negative for adenopathy.  Psychiatric/Behavioral: Negative for behavioral problems.     Physical Exam Updated Vital Signs BP (!) 202/89 (BP Location: Left Arm)   Pulse 70   Temp 97.7 F (36.5 C) (Oral)   Resp 16   Ht 5\' 10"  (1.778 m)   Wt 190 lb (86.2 kg)   SpO2 100%   BMI 27.26 kg/m   Physical Exam  Constitutional: He appears well-developed.  HENT:  Head: Atraumatic.  Eyes: EOM are normal.  Cardiovascular: Normal rate.   Pulmonary/Chest: Effort normal.  Abdominal: Soft.  Musculoskeletal:  Dialysis graft right upper extremity.  Neurological: He is alert.  Skin: Skin is warm. Capillary refill takes less than 2 seconds.  Psychiatric: He has a normal mood and affect.     ED Treatments / Results  Labs (all labs ordered are listed, but only abnormal results are displayed) Labs Reviewed  COMPREHENSIVE METABOLIC PANEL - Abnormal; Notable for the following:       Result Value   CO2 18 (*)    Glucose, Bld 151 (*)    BUN 81 (*)    Creatinine, Ser 7.00 (*)    Calcium 8.7 (*)    Total Protein 5.8 (*)    Albumin 2.9 (*)    AST 10 (*)    ALT 14 (*)    GFR calc non Af Amer 7 (*)    GFR calc Af Amer 8 (*)    All other components within normal limits  URINALYSIS, ROUTINE W REFLEX MICROSCOPIC - Abnormal; Notable for the following:    Color, Urine STRAW (*)    Glucose, UA >=500 (*)    Protein, ur 100 (*)    All other components within normal limits  CBC WITH DIFFERENTIAL/PLATELET - Abnormal;  Notable for the following:    RBC 3.90 (*)    Hemoglobin 11.6 (*)    HCT 35.4 (*)    All other components within normal limits    EKG  EKG Interpretation  Date/Time:  Wednesday December 04 2016 12:20:57 EDT Ventricular Rate:  77 PR Interval:    QRS Duration: 159 QT Interval:  432 QTC Calculation: 489 R Axis:     Text Interpretation:  electronically paced Confirmed by Alvino Chapel  MD, Rexanna Louthan [phone]) on 12/04/2016 12:27:27 PM       Radiology US Renal  Result Date: 12/04/2016  CLINICAL DATA:  Renal failure EXAM: RENAL / URINARY TRACT ULTRASOUND COMPLETE COMPARISON:  None. FINDINGS: Right Kidney: Length: 8.3 cm. Increased renal cortical echogenicity. 8 mm nonobstructing renal calculus in the midpole. 1.3 x 1.2 x 1 cm anechoic mass in the mid pole of the right kidney most consistent with a cyst. No solid mass or hydronephrosis visualized. Left Kidney: Length: 10.2 cm. Increased renal cortical echogenicity. No mass or hydronephrosis visualized. Bladder: Appears normal for degree of bladder distention. IMPRESSION: 1. Increased renal cortical echogenicity bilaterally as can be seen with medical renal disease. 2. Right renal cyst. 3. Nonobstructing 8 mm right renal calculus. Electronically Signed   By: Kathreen Devoid   On: 12/04/2016 13:14    Procedures Procedures (including critical care time)  Medications Ordered in ED Medications - No data to display   Initial Impression / Assessment and Plan / ED Course  I have reviewed the triage vital signs and the nursing notes.  Pertinent labs & imaging results that were available during my care of the patient were reviewed by me and considered in my medical decision making (see chart for details).     Patient sent in for worsening renal function. Has creatinine of 9. Potassium is reassuring. No obstruction seen on renal ultrasound. Does however have a postvoid residual of 290. Discussed with Dr. Moshe Cipro. No Foley at this time. Will follow-up  with Dr. Justin Mend.  Final Clinical Impressions(s) / ED Diagnoses   Final diagnoses:  Acute renal failure superimposed on chronic kidney disease, unspecified CKD stage, unspecified acute renal failure type [facility])    New Prescriptions Discharge Medication List as of 12/04/2016  2:10 PM       Davonna Belling, MD 12/04/16 1542

## 2016-12-04 NOTE — ED Notes (Signed)
Dr Alvino Chapel aware of pt bp of 202/89 and states pt ok to be discharged.

## 2016-12-04 NOTE — ED Triage Notes (Addendum)
Pt sent here by MD Justin Mend for abnormal Creatinine for possible obstruction. Pt denies any pain or urinary symptoms.

## 2016-12-04 NOTE — ED Notes (Signed)
Pt transported to US

## 2016-12-04 NOTE — ED Notes (Signed)
EKG given to Dr Pickering 

## 2016-12-12 ENCOUNTER — Ambulatory Visit: Payer: Medicare Other | Admitting: Podiatry

## 2016-12-20 ENCOUNTER — Ambulatory Visit (INDEPENDENT_AMBULATORY_CARE_PROVIDER_SITE_OTHER): Payer: Medicare Other | Admitting: Podiatry

## 2016-12-20 DIAGNOSIS — E0821 Diabetes mellitus due to underlying condition with diabetic nephropathy: Secondary | ICD-10-CM

## 2016-12-20 DIAGNOSIS — B351 Tinea unguium: Secondary | ICD-10-CM

## 2016-12-20 NOTE — Progress Notes (Signed)
Patient ID: James David, male   DOB: 09-May-1936, 81 y.o.   MRN: [id number] HPI  Complaint:  Visit Type: Patient returns to my office for continued preventative foot care services. Complaint: Patient states" my nails have grown long and thick and become painful to walk and wear shoes" Patient has been diagnosed with DM with neuropathy.Marland Kitchen He presents for preventative foot care services. No changes to ROS.  Patient says he has developed a corn on his fifth toe right foot.  Podiatric Exam: Vascular: dorsalis pedis and posterior tibial pulses are negative. Capillary return is immediate. Temperature gradient is negative. Skin turgor WNL, bilateral swelling  Sensorium: Normal Semmes Weinstein monofilament test. Normal tactile sensation bilaterally.  Nail Exam: Pt has thick disfigured discolored nails with subungual debris noted bilateral entire nail hallux through fifth toenails Ulcer Exam: There is no evidence of ulcer or pre-ulcerative changes or infection. Orthopedic Exam: Muscle tone and strength are WNL. No limitations in general ROM. No crepitus or effusions noted. Foot type and digits show no abnormalities. Bony prominences are unremarkable. Skin: No Porokeratosis. No infection or ulcers  Diagnosis:  Tinea unguium, Pain in right toe, pain in left toes  Treatment & Plan Procedures and Treatment: Consent by patient was obtained for treatment procedures. The patient understood the discussion of treatment and procedures well. All questions were answered thoroughly reviewed. Debridement of mycotic and hypertrophic toenails, 1 through 5 bilateral and clearing of subungual debris. No ulceration, no infection noted. Debride 5th toe corn.  Right leg was active spasming  during treatment.   Return Visit-Office Procedure: Patient instructed to return to the office for a follow up visit 3 months for continued evaluation and treatment.   Gardiner Barefoot DPM

## 2016-12-30 ENCOUNTER — Ambulatory Visit (INDEPENDENT_AMBULATORY_CARE_PROVIDER_SITE_OTHER): Payer: Medicare Other | Admitting: *Deleted

## 2016-12-30 DIAGNOSIS — I441 Atrioventricular block, second degree: Secondary | ICD-10-CM

## 2016-12-30 NOTE — Progress Notes (Signed)
Remote pacemaker transmission.   

## 2016-12-31 LAB — CUP PACEART REMOTE DEVICE CHECK
Battery Remaining Longevity: 118 mo
Battery Remaining Percentage: 95.5 %
Brady Statistic RA Percent Paced: 1 %
Brady Statistic RV Percent Paced: 99 %
Date Time Interrogation Session: 20180416060016
Implantable Lead Location: 753859
Implantable Lead Location: 753860
Implantable Lead Model: 1948
Implantable Pulse Generator Implant Date: 20150507
Lead Channel Impedance Value: 390 Ohm
Lead Channel Pacing Threshold Pulse Width: 0.4 ms
Lead Channel Sensing Intrinsic Amplitude: 0.6 mV
Lead Channel Sensing Intrinsic Amplitude: 9.3 mV
Lead Channel Setting Pacing Amplitude: 2 V
Lead Channel Setting Pacing Pulse Width: 0.4 ms
Lead Channel Setting Sensing Sensitivity: 2 mV
MDC IDC LEAD IMPLANT DT: 20150507
MDC IDC LEAD IMPLANT DT: 20150507
MDC IDC MSMT BATTERY VOLTAGE: 2.99 V
MDC IDC MSMT LEADCHNL RA PACING THRESHOLD AMPLITUDE: 0.75 V
MDC IDC MSMT LEADCHNL RV IMPEDANCE VALUE: 750 Ohm
MDC IDC MSMT LEADCHNL RV PACING THRESHOLD AMPLITUDE: 0.75 V
MDC IDC MSMT LEADCHNL RV PACING THRESHOLD PULSEWIDTH: 0.4 ms
MDC IDC SET LEADCHNL RV PACING AMPLITUDE: 2.5 V
MDC IDC STAT BRADY AP VP PERCENT: 1 %
MDC IDC STAT BRADY AP VS PERCENT: 1 %
MDC IDC STAT BRADY AS VP PERCENT: 99 %
MDC IDC STAT BRADY AS VS PERCENT: 1 %
Pulse Gen Model: 2240
Pulse Gen Serial Number: 7617100

## 2017-01-01 ENCOUNTER — Encounter: Payer: Self-pay | Admitting: Cardiology

## 2017-01-14 DIAGNOSIS — I639 Cerebral infarction, unspecified: Secondary | ICD-10-CM

## 2017-01-14 HISTORY — DX: Cerebral infarction, unspecified: I63.9

## 2017-01-29 ENCOUNTER — Inpatient Hospital Stay (HOSPITAL_COMMUNITY)
Admission: EM | Admit: 2017-01-29 | Discharge: 2017-02-06 | DRG: 064 | Disposition: A | Discharge status: Rehabilitation Facility | Payer: Medicare Other | Attending: Nephrology | Admitting: Nephrology

## 2017-01-29 ENCOUNTER — Encounter (HOSPITAL_COMMUNITY): Payer: Self-pay | Admitting: Emergency Medicine

## 2017-01-29 ENCOUNTER — Emergency Department (HOSPITAL_COMMUNITY): Payer: Medicare Other

## 2017-01-29 DIAGNOSIS — E784 Other hyperlipidemia: Secondary | ICD-10-CM | POA: Diagnosis not present

## 2017-01-29 DIAGNOSIS — N186 End stage renal disease: Secondary | ICD-10-CM | POA: Diagnosis not present

## 2017-01-29 DIAGNOSIS — I10 Essential (primary) hypertension: Secondary | ICD-10-CM | POA: Diagnosis not present

## 2017-01-29 DIAGNOSIS — I4892 Unspecified atrial flutter: Secondary | ICD-10-CM | POA: Diagnosis present

## 2017-01-29 DIAGNOSIS — E669 Obesity, unspecified: Secondary | ICD-10-CM | POA: Diagnosis present

## 2017-01-29 DIAGNOSIS — Z833 Family history of diabetes mellitus: Secondary | ICD-10-CM

## 2017-01-29 DIAGNOSIS — Z961 Presence of intraocular lens: Secondary | ICD-10-CM | POA: Diagnosis present

## 2017-01-29 DIAGNOSIS — R2990 Unspecified symptoms and signs involving the nervous system: Secondary | ICD-10-CM

## 2017-01-29 DIAGNOSIS — E041 Nontoxic single thyroid nodule: Secondary | ICD-10-CM | POA: Diagnosis present

## 2017-01-29 DIAGNOSIS — J90 Pleural effusion, not elsewhere classified: Secondary | ICD-10-CM

## 2017-01-29 DIAGNOSIS — N401 Enlarged prostate with lower urinary tract symptoms: Secondary | ICD-10-CM | POA: Diagnosis present

## 2017-01-29 DIAGNOSIS — I480 Paroxysmal atrial fibrillation: Secondary | ICD-10-CM

## 2017-01-29 DIAGNOSIS — N200 Calculus of kidney: Secondary | ICD-10-CM | POA: Diagnosis present

## 2017-01-29 DIAGNOSIS — D351 Benign neoplasm of parathyroid gland: Secondary | ICD-10-CM | POA: Diagnosis present

## 2017-01-29 DIAGNOSIS — I132 Hypertensive heart and chronic kidney disease with heart failure and with stage 5 chronic kidney disease, or end stage renal disease: Secondary | ICD-10-CM | POA: Diagnosis present

## 2017-01-29 DIAGNOSIS — R29700 NIHSS score 0: Secondary | ICD-10-CM | POA: Diagnosis present

## 2017-01-29 DIAGNOSIS — R29810 Facial weakness: Secondary | ICD-10-CM | POA: Diagnosis present

## 2017-01-29 DIAGNOSIS — Z992 Dependence on renal dialysis: Secondary | ICD-10-CM | POA: Diagnosis not present

## 2017-01-29 DIAGNOSIS — R42 Dizziness and giddiness: Secondary | ICD-10-CM

## 2017-01-29 DIAGNOSIS — G459 Transient cerebral ischemic attack, unspecified: Secondary | ICD-10-CM | POA: Diagnosis not present

## 2017-01-29 DIAGNOSIS — E0821 Diabetes mellitus due to underlying condition with diabetic nephropathy: Secondary | ICD-10-CM

## 2017-01-29 DIAGNOSIS — I639 Cerebral infarction, unspecified: Secondary | ICD-10-CM | POA: Diagnosis present

## 2017-01-29 DIAGNOSIS — R338 Other retention of urine: Secondary | ICD-10-CM | POA: Diagnosis present

## 2017-01-29 DIAGNOSIS — G2581 Restless legs syndrome: Secondary | ICD-10-CM | POA: Diagnosis not present

## 2017-01-29 DIAGNOSIS — R55 Syncope and collapse: Secondary | ICD-10-CM

## 2017-01-29 DIAGNOSIS — I6340 Cerebral infarction due to embolism of unspecified cerebral artery: Principal | ICD-10-CM | POA: Diagnosis present

## 2017-01-29 DIAGNOSIS — E1122 Type 2 diabetes mellitus with diabetic chronic kidney disease: Secondary | ICD-10-CM | POA: Diagnosis present

## 2017-01-29 DIAGNOSIS — Z7984 Long term (current) use of oral hypoglycemic drugs: Secondary | ICD-10-CM

## 2017-01-29 DIAGNOSIS — I951 Orthostatic hypotension: Secondary | ICD-10-CM

## 2017-01-29 DIAGNOSIS — I4891 Unspecified atrial fibrillation: Secondary | ICD-10-CM | POA: Diagnosis present

## 2017-01-29 DIAGNOSIS — Z888 Allergy status to other drugs, medicaments and biological substances status: Secondary | ICD-10-CM

## 2017-01-29 DIAGNOSIS — Z7982 Long term (current) use of aspirin: Secondary | ICD-10-CM

## 2017-01-29 DIAGNOSIS — D631 Anemia in chronic kidney disease: Secondary | ICD-10-CM | POA: Diagnosis present

## 2017-01-29 DIAGNOSIS — I5032 Chronic diastolic (congestive) heart failure: Secondary | ICD-10-CM | POA: Diagnosis present

## 2017-01-29 DIAGNOSIS — Z8249 Family history of ischemic heart disease and other diseases of the circulatory system: Secondary | ICD-10-CM

## 2017-01-29 DIAGNOSIS — R471 Dysarthria and anarthria: Secondary | ICD-10-CM | POA: Diagnosis present

## 2017-01-29 DIAGNOSIS — Z9842 Cataract extraction status, left eye: Secondary | ICD-10-CM

## 2017-01-29 DIAGNOSIS — Z6827 Body mass index (BMI) 27.0-27.9, adult: Secondary | ICD-10-CM

## 2017-01-29 DIAGNOSIS — Z85820 Personal history of malignant melanoma of skin: Secondary | ICD-10-CM

## 2017-01-29 DIAGNOSIS — I441 Atrioventricular block, second degree: Secondary | ICD-10-CM | POA: Diagnosis not present

## 2017-01-29 DIAGNOSIS — Z950 Presence of cardiac pacemaker: Secondary | ICD-10-CM

## 2017-01-29 DIAGNOSIS — Z9841 Cataract extraction status, right eye: Secondary | ICD-10-CM

## 2017-01-29 DIAGNOSIS — R310 Gross hematuria: Secondary | ICD-10-CM

## 2017-01-29 DIAGNOSIS — IMO0002 Reserved for concepts with insufficient information to code with codable children: Secondary | ICD-10-CM | POA: Diagnosis present

## 2017-01-29 DIAGNOSIS — I679 Cerebrovascular disease, unspecified: Secondary | ICD-10-CM

## 2017-01-29 DIAGNOSIS — G8194 Hemiplegia, unspecified affecting left nondominant side: Secondary | ICD-10-CM | POA: Diagnosis present

## 2017-01-29 DIAGNOSIS — I6789 Other cerebrovascular disease: Secondary | ICD-10-CM | POA: Diagnosis present

## 2017-01-29 DIAGNOSIS — E785 Hyperlipidemia, unspecified: Secondary | ICD-10-CM | POA: Diagnosis present

## 2017-01-29 DIAGNOSIS — M898X9 Other specified disorders of bone, unspecified site: Secondary | ICD-10-CM | POA: Diagnosis present

## 2017-01-29 HISTORY — DX: Dependence on renal dialysis: Z99.2

## 2017-01-29 HISTORY — DX: End stage renal disease: N18.6

## 2017-01-29 LAB — I-STAT TROPONIN, ED: Troponin i, poc: 0.01 ng/mL (ref 0.00–0.08)

## 2017-01-29 LAB — COMPREHENSIVE METABOLIC PANEL
ALBUMIN: 2.9 g/dL — AB (ref 3.5–5.0)
ALT: 15 U/L — ABNORMAL LOW (ref 17–63)
AST: 13 U/L — AB (ref 15–41)
Alkaline Phosphatase: 65 U/L (ref 38–126)
Anion gap: 12 (ref 5–15)
BILIRUBIN TOTAL: 0.5 mg/dL (ref 0.3–1.2)
BUN: 72 mg/dL — AB (ref 6–20)
CHLORIDE: 101 mmol/L (ref 101–111)
CO2: 21 mmol/L — AB (ref 22–32)
Calcium: 8.6 mg/dL — ABNORMAL LOW (ref 8.9–10.3)
Creatinine, Ser: 6.82 mg/dL — ABNORMAL HIGH (ref 0.61–1.24)
GFR calc Af Amer: 8 mL/min — ABNORMAL LOW (ref >60)
GFR calc non Af Amer: 7 mL/min — ABNORMAL LOW (ref >60)
GLUCOSE: 234 mg/dL — AB (ref 65–99)
POTASSIUM: 4.4 mmol/L (ref 3.5–5.1)
SODIUM: 134 mmol/L — AB (ref 135–145)
Total Protein: 5.5 g/dL — ABNORMAL LOW (ref 6.5–8.1)

## 2017-01-29 LAB — URINALYSIS, ROUTINE W REFLEX MICROSCOPIC
BACTERIA UA: NONE SEEN
Bilirubin Urine: NEGATIVE
Glucose, UA: >=500 mg/dL — AB
Hgb urine dipstick: NEGATIVE
Ketones, ur: NEGATIVE mg/dL
Leukocytes, UA: NEGATIVE
Nitrite: NEGATIVE
PH: 7 (ref 5.0–8.0)
Protein, ur: >=300 mg/dL — AB
SPECIFIC GRAVITY, URINE: 1.01 (ref 1.005–1.030)

## 2017-01-29 LAB — APTT: APTT: 31 s (ref 24–36)

## 2017-01-29 LAB — DIFFERENTIAL
BASOS ABS: 0 10*3/uL (ref 0.0–0.1)
Basophils Relative: 0 %
EOS ABS: 0.1 10*3/uL (ref 0.0–0.7)
Eosinophils Relative: 1 %
LYMPHS ABS: 1 10*3/uL (ref 0.7–4.0)
Lymphocytes Relative: 9 %
Monocytes Absolute: 0.7 10*3/uL (ref 0.1–1.0)
Monocytes Relative: 6 %
Neutro Abs: 9.2 10*3/uL — ABNORMAL HIGH (ref 1.7–7.7)
Neutrophils Relative %: 84 %

## 2017-01-29 LAB — CBC
HCT: 30 % — ABNORMAL LOW (ref 39.0–52.0)
HEMOGLOBIN: 9.8 g/dL — AB (ref 13.0–17.0)
MCH: 30 pg (ref 26.0–34.0)
MCHC: 32.7 g/dL (ref 30.0–36.0)
MCV: 91.7 fL (ref 78.0–100.0)
Platelets: 174 10*3/uL (ref 150–400)
RBC: 3.27 MIL/uL — AB (ref 4.22–5.81)
RDW: 14.3 % (ref 11.5–15.5)
WBC: 11 10*3/uL — ABNORMAL HIGH (ref 4.0–10.5)

## 2017-01-29 LAB — GLUCOSE, CAPILLARY: Glucose-Capillary: 178 mg/dL — ABNORMAL HIGH (ref 65–99)

## 2017-01-29 LAB — RAPID URINE DRUG SCREEN, HOSP PERFORMED
AMPHETAMINES: NOT DETECTED
BARBITURATES: NOT DETECTED
BENZODIAZEPINES: NOT DETECTED
COCAINE: NOT DETECTED
OPIATES: NOT DETECTED
TETRAHYDROCANNABINOL: NOT DETECTED

## 2017-01-29 LAB — PROTIME-INR
INR: 1.02
Prothrombin Time: 13.4 seconds (ref 11.4–15.2)

## 2017-01-29 LAB — ETHANOL: Alcohol, Ethyl (B): <5 mg/dL (ref <5)

## 2017-01-29 MED ORDER — POLYVINYL ALCOHOL 1.4 % OP SOLN
2.0000 [drp] | Freq: Every day | OPHTHALMIC | Status: DC | PRN
  Administered 2017-01-31 – 2017-02-05 (×3): 2 [drp] via OPHTHALMIC
  Filled 2017-01-29 (×2): qty 15

## 2017-01-29 MED ORDER — INSULIN ASPART 100 UNIT/ML ~~LOC~~ SOLN
0.0000 [IU] | Freq: Every day | SUBCUTANEOUS | Status: DC
  Administered 2017-02-01 – 2017-02-05 (×4): 2 [IU] via SUBCUTANEOUS

## 2017-01-29 MED ORDER — EZETIMIBE 10 MG PO TABS
10.0000 mg | ORAL_TABLET | Freq: Every day | ORAL | Status: DC
  Administered 2017-01-30 – 2017-02-06 (×8): 10 mg via ORAL
  Filled 2017-01-29 (×8): qty 1

## 2017-01-29 MED ORDER — ASPIRIN EC 81 MG PO TBEC
81.0000 mg | DELAYED_RELEASE_TABLET | Freq: Every day | ORAL | Status: DC
  Administered 2017-01-29: 81 mg via ORAL
  Filled 2017-01-29: qty 1

## 2017-01-29 MED ORDER — ACETAMINOPHEN 325 MG PO TABS
650.0000 mg | ORAL_TABLET | ORAL | Status: DC | PRN
  Administered 2017-02-01 – 2017-02-06 (×12): 650 mg via ORAL
  Filled 2017-01-29 (×13): qty 2

## 2017-01-29 MED ORDER — CALCIUM ACETATE (PHOS BINDER) 667 MG PO CAPS
1334.0000 mg | ORAL_CAPSULE | Freq: Three times a day (TID) | ORAL | Status: DC
  Administered 2017-01-29 – 2017-02-06 (×21): 1334 mg via ORAL
  Filled 2017-01-29 (×20): qty 2

## 2017-01-29 MED ORDER — INSULIN ASPART 100 UNIT/ML ~~LOC~~ SOLN
0.0000 [IU] | Freq: Three times a day (TID) | SUBCUTANEOUS | Status: DC
  Administered 2017-01-30: 1 [IU] via SUBCUTANEOUS
  Administered 2017-01-30: 2 [IU] via SUBCUTANEOUS
  Administered 2017-01-31: 5 [IU] via SUBCUTANEOUS
  Administered 2017-01-31 (×2): 2 [IU] via SUBCUTANEOUS
  Administered 2017-02-01: 5 [IU] via SUBCUTANEOUS
  Administered 2017-02-01: 2 [IU] via SUBCUTANEOUS
  Administered 2017-02-02: 5 [IU] via SUBCUTANEOUS
  Administered 2017-02-02: 2 [IU] via SUBCUTANEOUS
  Administered 2017-02-02: 5 [IU] via SUBCUTANEOUS
  Administered 2017-02-03: 2 [IU] via SUBCUTANEOUS
  Administered 2017-02-03 (×2): 3 [IU] via SUBCUTANEOUS
  Administered 2017-02-04: 2 [IU] via SUBCUTANEOUS
  Administered 2017-02-04: 3 [IU] via SUBCUTANEOUS
  Administered 2017-02-05: 7 [IU] via SUBCUTANEOUS
  Administered 2017-02-05: 3 [IU] via SUBCUTANEOUS
  Administered 2017-02-05: 2 [IU] via SUBCUTANEOUS

## 2017-01-29 MED ORDER — STROKE: EARLY STAGES OF RECOVERY BOOK
Freq: Once | Status: AC
  Administered 2017-01-29: 17:00:00
  Filled 2017-01-29: qty 1

## 2017-01-29 MED ORDER — ACETAMINOPHEN 160 MG/5ML PO SOLN: 650.0000 mg | ORAL | Status: DC | PRN

## 2017-01-29 MED ORDER — FENOFIBRATE 160 MG PO TABS
160.0000 mg | ORAL_TABLET | Freq: Every day | ORAL | Status: DC
  Administered 2017-01-30 – 2017-02-06 (×8): 160 mg via ORAL
  Filled 2017-01-29 (×8): qty 1

## 2017-01-29 MED ORDER — FINASTERIDE 5 MG PO TABS
5.0000 mg | ORAL_TABLET | Freq: Every day | ORAL | Status: DC
  Administered 2017-01-30 – 2017-02-06 (×8): 5 mg via ORAL
  Filled 2017-01-29 (×8): qty 1

## 2017-01-29 MED ORDER — GLIMEPIRIDE 4 MG PO TABS
4.0000 mg | ORAL_TABLET | Freq: Two times a day (BID) | ORAL | Status: DC
  Administered 2017-01-29: 4 mg via ORAL
  Filled 2017-01-29: qty 1

## 2017-01-29 MED ORDER — IPRATROPIUM BROMIDE 0.06 % NA SOLN
2.0000 | Freq: Every day | NASAL | Status: DC
  Administered 2017-02-01 – 2017-02-06 (×5): 2 via NASAL
  Filled 2017-01-29 (×2): qty 15

## 2017-01-29 MED ORDER — ACETAMINOPHEN 650 MG RE SUPP: 650.0000 mg | RECTAL | Status: DC | PRN

## 2017-01-29 MED ORDER — HEPARIN SODIUM (PORCINE) 5000 UNIT/ML IJ SOLN
5000.0000 [IU] | Freq: Three times a day (TID) | INTRAMUSCULAR | Status: DC
  Administered 2017-01-29 – 2017-02-03 (×13): 5000 [IU] via SUBCUTANEOUS
  Filled 2017-01-29 (×13): qty 1

## 2017-01-29 MED ORDER — IPRATROPIUM BROMIDE 0.03 % NA SOLN
2.0000 | Freq: Every day | NASAL | Status: DC
  Filled 2017-01-29: qty 30

## 2017-01-29 NOTE — ED Triage Notes (Signed)
Pt arrives by [facility], pt was at [facility] eating breakfast and had a sudden onset of becoming diaphoretic and dizzy. Pt states the dizziness lasted about 20 minutes and resolved. Pt is alert and ox4. NIH 0 at this time.

## 2017-01-29 NOTE — ED Notes (Signed)
Patient transported to CT 

## 2017-01-29 NOTE — ED Notes (Signed)
[facility] called this RN stating that patient's pacemaker interrogation showed no abnormalities.

## 2017-01-29 NOTE — ED Notes (Signed)
Family at bedside. 

## 2017-01-29 NOTE — ED Notes (Signed)
PT did not have to use RR at this time  

## 2017-01-29 NOTE — Progress Notes (Signed)
Pt stable arrived from ED with no noted distress. Pt denies pain, dizziness, or discomfort. Telemetry applied. Pt oriented to room. Safety measures in place. Call bell within reach. No noted distress. Will continue to monitor.

## 2017-01-29 NOTE — ED Provider Notes (Signed)
[facility] DEPT Provider Note   CSN: [id number] Arrival date & time: 01/29/17  1053     History   Chief Complaint Chief Complaint  Patient presents with  . Dizziness    HPI MADDON HORTON is a 81 y.o. male.  ROARKE MARCIANO is a 81 y.o. Male with a history of diabetes, hypertension, A. fib and end-stage renal disease on dialysis who presents to the emergency department after a near syncopal episode and transient weakness prior to arrival today. Patient reports he was sitting down eating breakfast when he began feeling lightheaded and felt like he couldn't move his left arm well. He reports he felt like he could not bring his left arm up to his mouth to drink coffee. He also reported feeling sweaty and like he might pass out. He denies room spinning dizziness. He denies syncope. He does report that this episode lasted about 15 minutes. Wife is at bedside reports during this time he also had some slurred speech. She reports he seems to be back to baseline now. No weakness and no slurred speech. He does not have a history of CVA. He has a dialysis schedule of Tuesday, Thursday and Saturday. Last dialysis was yesterday. He is not on anticoagulants. He denies hitting his head or loss of consciousness. He does have a pacemaker. He denies fevers, chest pain, shortness of breath, abdominal pain, nausea, vomiting, diarrhea, rashes, urinary symptoms, current numbness or weakness, changes to his vision, neck pain or back pain.   The history is provided by the patient, medical records and the spouse. No language interpreter was used.  Dizziness  Associated symptoms: weakness (resolved )   Associated symptoms: no chest pain, no diarrhea, no headaches, no nausea, no palpitations, no shortness of breath and no vomiting     Past Medical History:  Diagnosis Date  . Atrial fibrillation ([place])   . Chronic kidney disease, stage IV (severe) (HCC)    baseline creatinine 2-3  . DDD (degenerative disc  disease)   . Diabetes mellitus without complication ([place])   . Hyperlipidemia   . Hypertension   . Melanoma ([place]) 2010   removed at [facility]  . Second degree heart block    a. s/p STJ dual chamber PPM - Dr Allred    Patient Active Problem List   Diagnosis Date Noted  . TIA (transient ischemic attack) 01/29/2017  . Melanoma of skin ([place]) 03/13/2016  . Atrial flutter ([place]) 06/26/2015  . Pacemaker 10/13/2014  . End stage renal disease ([place]) 07/15/2014  . Chronic diastolic heart failure ([place]) 03/24/2014  . Second-degree heart block 12/13/2013  . Chronic kidney disease, stage IV (severe) ([place]) 12/13/2013  . Essential hypertension 12/13/2013  . Diabetes mellitus due to underlying condition with diabetic nephropathy ([place]) 12/13/2013  . Hyperlipidemia 12/13/2013    Past Surgical History:  Procedure Laterality Date  . AV FISTULA PLACEMENT Right 05/31/2014   Procedure: Right Arm Brachiocephalic ARTERIOVENOUS FISTULA CREATION  ;  Surgeon: Conrad Caruthers[place], MD;  Location: [facility];  Service: Vascular;  Laterality: Right;  . CATARACT EXTRACTION W/ INTRAOCULAR LENS  IMPLANT, BILATERAL    . COLONOSCOPY    . EXCISION MELANOMA WITH SENTINEL LYMPH NODE BIOPSY Right 02/08/2016   Procedure: WIDE EXCISION RIGHT SHOULDER MELANOMA WITH RIGHT SENTINEL LYMPH NODE BIOPSY;  Surgeon: [name], MD;  Location: [facility];  Service: General;  Laterality: Right;  . LAMINECTOMY    . PERMANENT PACEMAKER INSERTION N/A 01/20/2014   STJ Assurity dual chamber pacemaker implanted  by Dr [name] for 2nd degree AV block       Home Medications    Prior to Admission medications   Medication Sig Start Date End Date Taking? Authorizing Provider  ACCU-CHEK AVIVA PLUS test strip  02/08/14  Yes [provider]  acetaminophen (TYLENOL) 500 MG tablet Take 500 mg by mouth daily as needed for mild pain. (pain/headaches)   Yes [provider]  aspirin EC 81 MG tablet Take 81 mg by mouth at bedtime.   Yes [provider]  calcium acetate (PHOSLO) 667 MG capsule Take 2 capsules by mouth 3 (three) times daily. 01/22/17  Yes [provider]  carvedilol (COREG) 25 MG tablet Take 1 tablet (25 mg total) by mouth 2 (two) times daily with a meal. 11/23/15  Yes Belva Crome, MD  ezetimibe (ZETIA) 10 MG tablet Take 10 mg by mouth daily. 12/11/16  Yes [provider]  fenofibrate 160 MG tablet Take 160 mg by mouth daily.   Yes [provider]  finasteride (PROSCAR) 5 MG tablet Take 5 mg by mouth daily. 04/29/16  Yes [provider]  furosemide (LASIX) 80 MG tablet Take 40 mg by mouth daily.    Yes [provider]  glimepiride (AMARYL) 4 MG tablet Take 4 mg by mouth 2 (two) times daily.    Yes [provider]  Hypromellose (ARTIFICIAL TEARS OP) Place 1 drop into both eyes daily as needed (supplement).   Yes [provider]  ipratropium (ATROVENT) 0.03 % nasal spray Place 2 sprays into both nostrils daily.    Yes [provider]  tamsulosin (FLOMAX) 0.4 MG CAPS capsule Take 0.4 mg by mouth 3 (three) times a week. Monday, Wednesday and Friday (at bedtime)   Yes [provider]    Family History Family History  Problem Relation Age of Onset  . Pulmonary embolism Mother   . Diabetes Mother   . Heart attack Father   . Diabetes Brother     Social History Social History  Substance Use Topics  . Smoking status: Never Smoker  . Smokeless tobacco: Never Used  . Alcohol use No     Allergies   Amlodipine besylate; Lisinopril; and Statins   Review of Systems Review of Systems  Constitutional: Negative for chills and fever.  HENT: Negative for congestion and sore throat.   Eyes: Negative for visual disturbance.  Respiratory: Negative for cough and shortness of breath.   Cardiovascular: Negative for chest pain and palpitations.  Gastrointestinal: Negative for abdominal pain, diarrhea, nausea and vomiting.  Genitourinary:  Negative for dysuria.  Musculoskeletal: Negative for back pain and neck pain.  Skin: Negative for rash.  Neurological: Positive for syncope (near syncope ), speech difficulty (resolved ), weakness (resolved ) and light-headedness. Negative for seizures, facial asymmetry, numbness and headaches.     Physical Exam Updated Vital Signs BP (!) 164/65   Pulse 71   Temp 98.5 F (36.9 C)   Resp 12   SpO2 100%   Physical Exam  Constitutional: He is oriented to person, place, and time. He appears well-developed and well-nourished. No distress.  Nontoxic appearing.  HENT:  Head: Normocephalic and atraumatic.  Right Ear: External ear normal.  Left Ear: External ear normal.  Mouth/Throat: Oropharynx is clear and moist.  Eyes: Conjunctivae and EOM are normal. Pupils are equal, round, and reactive to light. Right eye exhibits no discharge. Left eye exhibits no discharge.  Neck: Normal range of motion. Neck supple. No JVD  present. No tracheal deviation present.  Cardiovascular: Normal rate, regular rhythm, normal heart sounds and intact distal pulses.  Exam reveals no gallop and no friction rub.   No murmur heard. Paced rhythm on the monitor.  Pulmonary/Chest: Effort normal and breath sounds normal. No stridor. No respiratory distress. He has no wheezes. He has no rales.  Lungs are clear to ascultation bilaterally. Symmetric chest expansion bilaterally. No increased work of breathing. No rales or rhonchi.    Abdominal: Soft. There is no tenderness. There is no guarding.  Abdomen is soft and nontender to palpation.  Musculoskeletal: He exhibits no edema or tenderness.  Patient is spontaneously moving all extremities in a coordinated fashion exhibiting good strength.   Lymphadenopathy:    He has no cervical adenopathy.  Neurological: He is alert and oriented to person, place, and time. No cranial nerve deficit or sensory deficit. He exhibits normal muscle tone. Coordination normal.  Patient is  alert and oriented 3. Speech is clear and coherent. EOMs are intact. Vision is grossly intact. No pronator drift. Finger to nose intact bilaterally. Heel-to-shin intact bilaterally. Good strength in his bilateral upper and lower extremities. Good grip strengths bilaterally.  Skin: Skin is warm and dry. Capillary refill takes less than 2 seconds. No rash noted. He is not diaphoretic. No erythema. No pallor.  Psychiatric: He has a normal mood and affect. His behavior is normal.  Nursing note and vitals reviewed.    ED Treatments / Results  Labs (all labs ordered are listed, but only abnormal results are displayed) Labs Reviewed  CBC - Abnormal; Notable for the following:       Result Value   WBC 11.0 (*)    RBC 3.27 (*)    Hemoglobin 9.8 (*)    HCT 30.0 (*)    All other components within normal limits  DIFFERENTIAL - Abnormal; Notable for the following:    Neutro Abs 9.2 (*)    All other components within normal limits  COMPREHENSIVE METABOLIC PANEL - Abnormal; Notable for the following:    Sodium 134 (*)    CO2 21 (*)    Glucose, Bld 234 (*)    BUN 72 (*)    Creatinine, Ser 6.82 (*)    Calcium 8.6 (*)    Total Protein 5.5 (*)    Albumin 2.9 (*)    AST 13 (*)    ALT 15 (*)    GFR calc non Af Amer 7 (*)    GFR calc Af Amer 8 (*)    All other components within normal limits  URINALYSIS, ROUTINE W REFLEX MICROSCOPIC - Abnormal; Notable for the following:    Glucose, UA >=500 (*)    Protein, ur >=300 (*)    Squamous Epithelial / LPF 0-5 (*)    All other components within normal limits  ETHANOL  PROTIME-INR  APTT  RAPID URINE DRUG SCREEN, HOSP PERFORMED  I-STAT CHEM 8, ED  I-STAT TROPOININ, ED    EKG ED ECG REPORT   Date: 01/29/2017  Rate: 80  Rhythm: Atrial-sensed ventricular paced rhythm  QRS Axis: normal  Narrative Interpretation:   Old EKG Reviewed: unchanged  I have personally reviewed the EKG tracing and agree with the computerized printout as noted.    Radiology Dg Chest 2 View  Result Date: 01/29/2017 CLINICAL DATA:  Dizziness EXAM: CHEST  2 VIEW COMPARISON:  01/21/2014 FINDINGS: Pacing device is again seen and stable. Cardiac shadow is stable. Vascular stent is noted in the subclavian region  on the right. The lungs are clear bilaterally. Postsurgical changes in the left axilla are noted. IMPRESSION: No acute abnormality noted. Electronically Signed   By: Inez Catalina M.D.   On: 01/29/2017 12:13   Ct Head Wo Contrast  Result Date: 01/29/2017 CLINICAL DATA:  Dizziness and slurred speech. Chronic renal failure. History of melanoma EXAM: CT HEAD WITHOUT CONTRAST TECHNIQUE: Contiguous axial images were obtained from the base of the skull through the vertex without intravenous contrast. COMPARISON:  None. FINDINGS: Brain: There is mild diffuse atrophy. Left lateral ventricle is slightly larger than right lateral ventricle, a probable anatomic variant. There is no intracranial mass, hemorrhage, extra-axial fluid collection, or midline shift. There is patchy small vessel disease in the centra semiovale bilaterally. Elsewhere gray-white compartments appear normal. There is no evident acute appearing infarct. Vascular: There is no demonstrable hyperdense vessel. There is calcification in each carotid siphon region. There is also calcification in each distal vertebral artery. Skull: Bony calvarium appears intact. Sinuses/Orbits: There is mild mucosal thickening in the medial right maxillary antrum. There is mucosal thickening in several ethmoid air cells bilaterally. Other visualized paranasal sinuses are clear. Orbits appear symmetric bilaterally. Other: Mastoid air cells are clear. There is debris in each external auditory canal. IMPRESSION: Mild atrophy with patchy periventricular small vessel disease. No intracranial mass, hemorrhage, or extra-axial fluid collection. No acute appearing infarct evident. There are foci of arteriovascular calcification  bilaterally. There are areas of mild paranasal sinus disease. There is probable cerumen in each external auditory canal. Electronically Signed   By: Lowella Grip III M.D.   On: 01/29/2017 14:39    Procedures Procedures (including critical care time)  Medications Ordered in ED Medications - No data to display   Initial Impression / Assessment and Plan / ED Course  I have reviewed the triage vital signs and the nursing notes.  Pertinent labs & imaging results that were available during my care of the patient were reviewed by me and considered in my medical decision making (see chart for details).    This  is a 81 y.o. Male with a history of diabetes, hypertension, A. fib and end-stage renal disease on dialysis who presents to the emergency department after a near syncopal episode and transient weakness prior to arrival today. Patient reports he was sitting down eating breakfast when he began feeling lightheaded and felt like he couldn't move his left arm well. He reports he felt like he could not bring his left arm up to his mouth to drink coffee. He also reported feeling sweaty and like he might pass out. He denies room spinning dizziness. He denies syncope. He does report that this episode lasted about 15 minutes. Wife is at bedside reports during this time he also had some slurred speech. She reports he seems to be back to baseline now. No weakness and no slurred speech. He does not have a history of CVA. He has a dialysis schedule of Tuesday, Thursday and Saturday. Last dialysis was yesterday. On exam the patient is afebrile nontoxic appearing. He is no focal neurological deficits on exam. No left-sided weakness appreciated. No slurred speech noted.  CMP is consistent with the patient and end-stage renal disease. Glucose elevated at 234. Normal anion gap. CT head without contrast shows mild atrophy with patchy periventricular small vessel disease. No intracranial mass, hemorrhage or  extra-axial fluid collection noted. No acute appearing infarct. Chest x-ray shows no acute abnormalities. EKG is unchanged from his previous. Troponin is not elevated.  At reevaluation patient still without symptoms. Concern for possible TIA. I consulted with neuro hospitalist service who agrees with plan for medical admission for stroke workup. They will see the patient in consult. Neuro service would like to be consulted by medicine.  I consulted with [facility] hospitalist service who accepted the patient for admission.   This patient was discussed with and evaluated by Dr. Amedeo Plenty who agrees with assessment and plan.   Final Clinical Impressions(s) / ED Diagnoses   Final diagnoses:  Stroke-like symptoms  Lightheadedness  ESRD (end stage renal disease) on dialysis [facility])  Essential hypertension    New Prescriptions New Prescriptions   No medications on file     Waynetta Pean, Hershal Coria 01/29/17 1621    Fredia Sorrow, MD 01/31/17 [phone]

## 2017-01-29 NOTE — H&P (Signed)
History and Physical    James David RWE:[id number] DOB: Feb 18, 1936 DOA: 01/29/2017   PCP: [name], MD   Patient coming from/Resides with: Private residence/wife  Admission status: Observation/telemetry -may be medically necessary to stay a minimum 2 midnights to rule out impending and/or unexpected changes in physiologic status that may differ from initial evaluation performed in the ER and/or at time of admission-consider reevaluation of admission status in 24 hours.   Chief Complaint: Dizziness and diaphoresis and left arm weakness/tingling  HPI: James David is a 81 y.o. male with medical history significant for atrial flutter followed by cardiology currently not on anticoagulation, history of heart block with pacemaker, chronic kidney disease just started dialysis on 5/15, obese mellitus, hypertension, degenerative disc disease. Patient was eating breakfast earlier this morning when he developed sudden onset of diaphoresis and dizziness that was associated with left arm weakness and tingling and difficulty using the left arm. He was also reported to have slurred speech, no facial symptoms. Symptoms lasted about 15-20 minutes and resolved and have not recurred. Patient presented to the ER with initial CT of the head without contrast negative for acute intracranial issues. Pacemaker was interrogated without any arrhythmia or other events noted. Unable to pursue MRI due to pacemaker.  ED Course:  Vital Signs: BP (!) 175/67 (BP Location: Left Arm)   Pulse 75   Temp 98.5 F (36.9 C)   Resp 16   SpO2 97%  2 view CXR: Neg CT head without contrast: Mild atrophy with patchy. Ventricular small vessel disease without any acute infarct noted. Incidental finding of probable cerumen in each external auditory canal Lab data: Sodium 134, potassium 4.4, chloride 101, CO2 21, glucose 234, BUN 72, creatinine 6.82, LFTs not elevated, poc troponin 0.01, white count 11,000 with neutrophils 84%  absolute neutrophils 9.2%, hemoglobin 9.8, platelets 174,000, coags normal, urinalysis unremarkable except for glycosuria and proteinuria with negative nitrite leukocytes and WBCs, alcohol less than 5, urine drug screen negative Medications and treatments: None  Review of Systems:  In addition to the HPI above,  No Fever-chills, myalgias or other constitutional symptoms No Headache, changes with Vision or hearing, dysarthria or word finding difficulty, gait disturbance or imbalance, tremors or seizure activity No problems swallowing food or Liquids, indigestion/reflux, choking or coughing while eating, abdominal pain with or after eating No Chest pain, Cough or Shortness of Breath, palpitations, orthopnea or DOE No Abdominal pain, N/V, melena,hematochezia, dark tarry stools, constipation No dysuria, malodorous urine, hematuria or flank pain No new skin rashes, lesions, masses or bruises, No new joint pains, aches, swelling or redness No recent unintentional weight gain or loss No polyuria, polydypsia or polyphagia   Past Medical History:  Diagnosis Date  . Atrial fibrillation ([place])   . Chronic kidney disease, stage IV (severe) ([facility])    baseline creatinine 2-3  . DDD (degenerative disc disease)   . Diabetes mellitus without complication ([place])   . Hyperlipidemia   . Hypertension   . Melanoma ([place]) 2010   removed at [facility]  . Second degree heart block    a. s/p STJ dual chamber PPM - Dr [name]    Past Surgical History:  Procedure Laterality Date  . AV FISTULA PLACEMENT Right 05/31/2014   Procedure: Right Arm Brachiocephalic ARTERIOVENOUS FISTULA CREATION  ;  Surgeon: Conrad [place]ina, MD;  Location: [place];  Service: Vascular;  Laterality: Right;  . CATARACT EXTRACTION W/ INTRAOCULAR LENS  IMPLANT, BILATERAL    . COLONOSCOPY    .  EXCISION MELANOMA WITH SENTINEL LYMPH NODE BIOPSY Right 02/08/2016   Procedure: WIDE EXCISION RIGHT SHOULDER MELANOMA WITH RIGHT SENTINEL LYMPH NODE BIOPSY;   Surgeon: [name], MD;  Location: [place];  Service: General;  Laterality: Right;  . LAMINECTOMY    . PERMANENT PACEMAKER INSERTION N/A 01/20/2014   STJ Assurity dual chamber pacemaker implanted by Dr [name] for 2nd degree AV block    Social History   Social History  . Marital status: Married    Spouse name: N/A  . Number of children: N/A  . Years of education: N/A   Occupational History  . Not on file.   Social History Main Topics  . Smoking status: Never Smoker  . Smokeless tobacco: Never Used  . Alcohol use No  . Drug use: No  . Sexual activity: Not on file   Other Topics Concern  . Not on file   Social History Narrative   Lives in [place] with spouse.  Retired Theatre manager for [employer].    Mobility: Independent Work history: Retired   Allergies  Allergen Reactions  . Amlodipine Besylate Swelling    angioedema  . Lisinopril Other (See Comments)    Renal insufficiency  . Statins Other (See Comments)    Muscle aches    Family History  Problem Relation Age of Onset  . Pulmonary embolism Mother   . Diabetes Mother   . Heart attack Father   . Diabetes Brother      Prior to Admission medications   Medication Sig Start Date End Date Taking? Authorizing Provider  ACCU-CHEK AVIVA PLUS test strip  02/08/14  Yes [provider]  acetaminophen (TYLENOL) 500 MG tablet Take 500 mg by mouth daily as needed for mild pain. (pain/headaches)   Yes [provider]  aspirin EC 81 MG tablet Take 81 mg by mouth at bedtime.   Yes [provider]  calcium acetate (PHOSLO) 667 MG capsule Take 2 capsules by mouth 3 (three) times daily. 01/22/17  Yes [provider]  carvedilol (COREG) 25 MG tablet Take 1 tablet (25 mg total) by mouth 2 (two) times daily with a meal. 11/23/15  Yes Belva Crome, MD  ezetimibe (ZETIA) 10 MG tablet Take 10 mg by mouth daily. 12/11/16  Yes [provider]  fenofibrate 160 MG tablet Take 160 mg by  mouth daily.   Yes [provider]  finasteride (PROSCAR) 5 MG tablet Take 5 mg by mouth daily. 04/29/16  Yes [provider]  furosemide (LASIX) 80 MG tablet Take 40 mg by mouth daily.    Yes [provider]  glimepiride (AMARYL) 4 MG tablet Take 4 mg by mouth 2 (two) times daily.    Yes [provider]  Hypromellose (ARTIFICIAL TEARS OP) Place 1 drop into both eyes daily as needed (supplement).   Yes [provider]  ipratropium (ATROVENT) 0.03 % nasal spray Place 2 sprays into both nostrils daily.    Yes [provider]  tamsulosin (FLOMAX) 0.4 MG CAPS capsule Take 0.4 mg by mouth 3 (three) times a week. Monday, Wednesday and Friday (at bedtime)   Yes [provider]    Physical Exam: Vitals:   01/29/17 1515 01/29/17 1532 01/29/17 1545 01/29/17 1600  BP: (!) 164/65  (!) 171/76 (!) 175/67  Pulse: 71  (!) 46 75  Resp: 12  14 16   Temp:  98.5 F (36.9 C)    TempSrc:      SpO2: 100%  96% 97%  Constitutional: NAD, calm, comfortable Eyes: PERRL, lids and conjunctivae normal ENMT: Mucous membranes are moist. Posterior pharynx clear of any exudate or lesions.Normal dentition.  Neck: normal, supple, no masses, no thyromegaly Respiratory: clear to auscultation bilaterally, no wheezing, no crackles. Normal respiratory effort. No accessory muscle use.  Cardiovascular: Regular rate and rhythm, no murmurs / rubs / gallops. No extremity edema. 2+ pedal pulses. No carotid bruits. Dialysis access left upper extremity. Abdomen: no tenderness, no masses palpated. No hepatosplenomegaly. Bowel sounds positive.  Musculoskeletal: no clubbing / cyanosis. No joint deformity upper and lower extremities. Good ROM, no contractures. Normal muscle tone.  Skin: no rashes, lesions, ulcers. No induration-ecchymosis right arm from previous venipunctures Neurologic: CN 2-12 grossly intact. Sensation intact, DTR normal. Strength 5/5 x all 4 extremities.   Psychiatric: Normal judgment and insight. Alert and oriented x 3. Normal mood.    Labs on Admission: I have personally reviewed following labs and imaging studies  CBC:  Recent Labs Lab 01/29/17 1139  WBC 11.0*  NEUTROABS 9.2*  HGB 9.8*  HCT 30.0*  MCV 91.7  PLT 510   Basic Metabolic Panel:  Recent Labs Lab 01/29/17 1139  NA 134*  K 4.4  CL 101  CO2 21*  GLUCOSE 234*  BUN 72*  CREATININE 6.82*  CALCIUM 8.6*   GFR: CrCl cannot be calculated (Unknown ideal weight.). Liver Function Tests:  Recent Labs Lab 01/29/17 1139  AST 13*  ALT 15*  ALKPHOS 65  BILITOT 0.5  PROT 5.5*  ALBUMIN 2.9*   No results for input(s): LIPASE, AMYLASE in the last 168 hours. No results for input(s): AMMONIA in the last 168 hours. Coagulation Profile:  Recent Labs Lab 01/29/17 1139  INR 1.02   Cardiac Enzymes: No results for input(s): CKTOTAL, CKMB, CKMBINDEX, TROPONINI in the last 168 hours. BNP (last 3 results) No results for input(s): PROBNP in the last 8760 hours. HbA1C: No results for input(s): HGBA1C in the last 72 hours. CBG: No results for input(s): GLUCAP in the last 168 hours. Lipid Profile: No results for input(s): CHOL, HDL, LDLCALC, TRIG, CHOLHDL, LDLDIRECT in the last 72 hours. Thyroid Function Tests: No results for input(s): TSH, T4TOTAL, FREET4, T3FREE, THYROIDAB in the last 72 hours. Anemia Panel: No results for input(s): VITAMINB12, FOLATE, FERRITIN, TIBC, IRON, RETICCTPCT in the last 72 hours. Urine analysis:    Component Value Date/Time   COLORURINE YELLOW 01/29/2017 1501   APPEARANCEUR CLEAR 01/29/2017 1501   LABSPEC 1.010 01/29/2017 1501   PHURINE 7.0 01/29/2017 1501   GLUCOSEU >=500 (A) 01/29/2017 1501   HGBUR NEGATIVE 01/29/2017 1501   BILIRUBINUR NEGATIVE 01/29/2017 1501   KETONESUR NEGATIVE 01/29/2017 1501   PROTEINUR >=300 (A) 01/29/2017 1501   NITRITE NEGATIVE 01/29/2017 1501   LEUKOCYTESUR NEGATIVE 01/29/2017 1501   Sepsis  Labs: @LABRCNTIP (procalcitonin:4,lacticidven:4) )No results found for this or any previous visit (from the past 240 hour(s)).   Radiological Exams on Admission: Dg Chest 2 View  Result Date: 01/29/2017 CLINICAL DATA:  Dizziness EXAM: CHEST  2 VIEW COMPARISON:  01/21/2014 FINDINGS: Pacing device is again seen and stable. Cardiac shadow is stable. Vascular stent is noted in the subclavian region on the right. The lungs are clear bilaterally. Postsurgical changes in the left axilla are noted. IMPRESSION: No acute abnormality noted. Electronically Signed   By: Inez Catalina M.D.   On: 01/29/2017 12:13   Ct Head Wo Contrast  Result Date: 01/29/2017 CLINICAL DATA:  Dizziness and slurred speech. Chronic renal failure. History of melanoma EXAM: CT  HEAD WITHOUT CONTRAST TECHNIQUE: Contiguous axial images were obtained from the base of the skull through the vertex without intravenous contrast. COMPARISON:  None. FINDINGS: Brain: There is mild diffuse atrophy. Left lateral ventricle is slightly larger than right lateral ventricle, a probable anatomic variant. There is no intracranial mass, hemorrhage, extra-axial fluid collection, or midline shift. There is patchy small vessel disease in the centra semiovale bilaterally. Elsewhere gray-white compartments appear normal. There is no evident acute appearing infarct. Vascular: There is no demonstrable hyperdense vessel. There is calcification in each carotid siphon region. There is also calcification in each distal vertebral artery. Skull: Bony calvarium appears intact. Sinuses/Orbits: There is mild mucosal thickening in the medial right maxillary antrum. There is mucosal thickening in several ethmoid air cells bilaterally. Other visualized paranasal sinuses are clear. Orbits appear symmetric bilaterally. Other: Mastoid air cells are clear. There is debris in each external auditory canal. IMPRESSION: Mild atrophy with patchy periventricular small vessel disease. No  intracranial mass, hemorrhage, or extra-axial fluid collection. No acute appearing infarct evident. There are foci of arteriovascular calcification bilaterally. There are areas of mild paranasal sinus disease. There is probable cerumen in each external auditory canal. Electronically Signed   By: Lowella Grip III M.D.   On: 01/29/2017 14:39     Assessment/Plan Principal Problem:   TIA (transient ischemic attack) -Presents with transient onset of dizziness, diaphoresis, left upper extremity weakness issues and tingling that resolved after about 20 minutes and has not recurred -CT head unremarkable -Unable to pursue MRI given pacemaker -Carotid duplex -Echocardiogram -PT/OT evaluation -HgbA1c/FLP -Continue preadmission aspirin antiplatelet -Does have history of atrial flutter followed by cardiology as an outpatient and after device interrogation at October 2017 cardiology visit burden by device was less than 1% longest episode less than 15 minutes-current pacemaker interrogation unremarkable here in ER -Patient just started hemodialysis treatments yesterday on 5/15 and there is a possibility he may be orthostatic post treatment-check orthostatic vital signs and hold antihypertensive medications for now -Discussed with Dr. Mardene Celeste further imaging at this juncture recommends obtaining echocardiogram first given his history of paroxysmal atrial flutter not on anticoagulation  Active Problems:   CKD (chronic kidney disease) stage V requiring chronic dialysis  -Dialyzes MWF initial session on 5/15    Hypertension -Current blood pressure not well controlled -Hold off on resuming home medications until orthostatic vital signs completed -?? BPH/hold Flomax    Second degree heart block/Atrial flutter -On beta blocker for rate control -Pacemaker in place and interrogated with no significant arrhythmia that would explain patient's preadmission symptoms -Cardiology visit in October 2017  demonstrated less than 1% burden by device an episode of PAF duration less than 15 minutes therefore based on this criteria cardiology has determined not necessary to initiate anticoagulation -CHADVASc = 4     Diabetes mellitus 2 -CBG greater than 200 at presentation -Continue glimepiride -SSI    DDD (degenerative disc disease)    HLD (hyperlipidemia) -Continue Zetia and fenofibrate -Not on statin      DVT prophylaxis: Subcutaneous heparin Code Status: Full  Family Communication: Wife  Disposition Plan: Home Consults called: Nephrology/[place] , Neurology/[place]    Samella Parr ANP-BC [facility] Pager [phone]   If 7PM-7AM, please contact night-coverage [email or web address] Password [facility]  01/29/2017, 4:37 PM

## 2017-01-29 NOTE — ED Notes (Signed)
Patient transported to X-ray 

## 2017-01-29 NOTE — ED Notes (Signed)
pts wife at bedside reports that when pt had dizziness and diaphoresis this am, he also exhibited some slurred speech. pts wife report this occurred at 75. No neuro deficits at this time. Will Dansie, PA made aware and in to assess patient.

## 2017-01-30 ENCOUNTER — Observation Stay (HOSPITAL_COMMUNITY): Payer: Medicare Other

## 2017-01-30 ENCOUNTER — Other Ambulatory Visit: Payer: Self-pay

## 2017-01-30 DIAGNOSIS — R2990 Unspecified symptoms and signs involving the nervous system: Secondary | ICD-10-CM | POA: Diagnosis not present

## 2017-01-30 DIAGNOSIS — R55 Syncope and collapse: Secondary | ICD-10-CM | POA: Diagnosis not present

## 2017-01-30 DIAGNOSIS — E784 Other hyperlipidemia: Secondary | ICD-10-CM | POA: Diagnosis not present

## 2017-01-30 DIAGNOSIS — R310 Gross hematuria: Secondary | ICD-10-CM | POA: Diagnosis not present

## 2017-01-30 DIAGNOSIS — I639 Cerebral infarction, unspecified: Secondary | ICD-10-CM

## 2017-01-30 DIAGNOSIS — I4892 Unspecified atrial flutter: Secondary | ICD-10-CM

## 2017-01-30 DIAGNOSIS — N186 End stage renal disease: Secondary | ICD-10-CM | POA: Diagnosis not present

## 2017-01-30 DIAGNOSIS — G459 Transient cerebral ischemic attack, unspecified: Secondary | ICD-10-CM | POA: Diagnosis not present

## 2017-01-30 DIAGNOSIS — I951 Orthostatic hypotension: Secondary | ICD-10-CM | POA: Diagnosis not present

## 2017-01-30 DIAGNOSIS — Z992 Dependence on renal dialysis: Secondary | ICD-10-CM | POA: Diagnosis not present

## 2017-01-30 DIAGNOSIS — I10 Essential (primary) hypertension: Secondary | ICD-10-CM | POA: Diagnosis not present

## 2017-01-30 LAB — LIPID PANEL
CHOL/HDL RATIO: 6.1 ratio
CHOLESTEROL: 176 mg/dL (ref 0–200)
HDL: 29 mg/dL — AB (ref >40)
LDL Cholesterol: 99 mg/dL (ref 0–99)
Triglycerides: 242 mg/dL — ABNORMAL HIGH (ref <150)
VLDL: 48 mg/dL — ABNORMAL HIGH (ref 0–40)

## 2017-01-30 LAB — GLUCOSE, CAPILLARY
GLUCOSE-CAPILLARY: 125 mg/dL — AB (ref 65–99)
GLUCOSE-CAPILLARY: 154 mg/dL — AB (ref 65–99)
Glucose-Capillary: 115 mg/dL — ABNORMAL HIGH (ref 65–99)

## 2017-01-30 MED ORDER — DOXERCALCIFEROL 4 MCG/2ML IV SOLN
INTRAVENOUS | Status: AC
  Administered 2017-01-30: 4 ug via INTRAVENOUS
  Filled 2017-01-30: qty 2

## 2017-01-30 MED ORDER — CLOPIDOGREL BISULFATE 75 MG PO TABS
75.0000 mg | ORAL_TABLET | Freq: Every day | ORAL | Status: DC
  Administered 2017-01-30 – 2017-02-01 (×3): 75 mg via ORAL
  Filled 2017-01-30 (×3): qty 1

## 2017-01-30 MED ORDER — PRO-STAT SUGAR FREE PO LIQD
30.0000 mL | Freq: Two times a day (BID) | ORAL | Status: DC
  Administered 2017-01-30 – 2017-02-06 (×13): 30 mL via ORAL
  Filled 2017-01-30 (×12): qty 30

## 2017-01-30 MED ORDER — LIDOCAINE HCL (PF) 1 % IJ SOLN: 5.0000 mL | INTRAMUSCULAR | Status: DC | PRN

## 2017-01-30 MED ORDER — SODIUM CHLORIDE 0.9 % IV SOLN: 100.0000 mL | INTRAVENOUS | Status: DC | PRN

## 2017-01-30 MED ORDER — HEPARIN SODIUM (PORCINE) 1000 UNIT/ML DIALYSIS: 1000.0000 [IU] | INTRAMUSCULAR | Status: DC | PRN

## 2017-01-30 MED ORDER — DOXERCALCIFEROL 4 MCG/2ML IV SOLN
4.0000 ug | INTRAVENOUS | Status: DC
  Administered 2017-01-30 – 2017-02-06 (×4): 4 ug via INTRAVENOUS
  Filled 2017-01-30 (×4): qty 2

## 2017-01-30 MED ORDER — PENTAFLUOROPROP-TETRAFLUOROETH EX AERO: 1.0000 "application " | INHALATION_SPRAY | CUTANEOUS | Status: DC | PRN

## 2017-01-30 MED ORDER — LIDOCAINE-PRILOCAINE 2.5-2.5 % EX CREA: 1.0000 "application " | TOPICAL_CREAM | CUTANEOUS | Status: DC | PRN

## 2017-01-30 NOTE — Consult Note (Signed)
Neurology Consult Note  Reason for Consultation: TIA  Requesting provider: Linna Darner, MD  CC: left arm numbness, slurred speech  HPI: This is a 69-yo RH man who presented to the ED after he had an episode of L arm numbness and tingling yesterday. He was at [facility] when he noted that his left arm felt numb. This was preceded by sweating and dizziness. He states the has also had some slurred speech. His numbness resolved after about 15 minutes but he feels like his speech remains slightly more slurred than usual. He denies any facial droop or symptoms in the LLE. In the ED he had a CTH which showed no acute abnormality. His pacemaker was interrogated and showed no arryhythmia or other cardiac irregularity that correlated with his symptoms. He was admitted for further evaluation.   He states that last night he was trying to walk to the bathroom in his room but had difficulty because his left leg seemed to give out from under him. He does not feel any weakness in his leg right now.   Of note, the patient started hemodialysis on 5/15. He has a pacemaker in place due to a diagnosis of second degree heart block. He has a known h/o atrial fibrillation but is no anticoagulated. He denies any prior history of stroke.   PMH:  Past Medical History:  Diagnosis Date  . Atrial fibrillation ([place])   . Chronic kidney disease, stage IV (severe) (HCC)    baseline creatinine 2-3  . DDD (degenerative disc disease)   . Diabetes mellitus without complication ([place])   . Hyperlipidemia   . Hypertension   . Melanoma ([place]) 2010   removed at [facility]  . Second degree heart block    a. s/p STJ dual chamber PPM - Dr [name]    PSH:  Past Surgical History:  Procedure Laterality Date  . AV FISTULA PLACEMENT Right 05/31/2014   Procedure: Right Arm Brachiocephalic ARTERIOVENOUS FISTULA CREATION  ;  Surgeon: Conrad La Crosse, MD;  Location: [place];  Service: Vascular;  Laterality: Right;  . CATARACT EXTRACTION W/  INTRAOCULAR LENS  IMPLANT, BILATERAL    . COLONOSCOPY    . EXCISION MELANOMA WITH SENTINEL LYMPH NODE BIOPSY Right 02/08/2016   Procedure: WIDE EXCISION RIGHT SHOULDER MELANOMA WITH RIGHT SENTINEL LYMPH NODE BIOPSY;  Surgeon: [name], MD;  Location: [place];  Service: General;  Laterality: Right;  . LAMINECTOMY    . PERMANENT PACEMAKER INSERTION N/A 01/20/2014   STJ Assurity dual chamber pacemaker implanted by Dr [name] for 2nd degree AV block    Family history: Family History  Problem Relation Age of Onset  . Pulmonary embolism Mother   . Diabetes Mother   . Heart attack Father   . Diabetes Brother     Social history:  Social History   Social History  . Marital status: Married    Spouse name: N/A  . Number of children: N/A  . Years of education: N/A   Occupational History  . Not on file.   Social History Main Topics  . Smoking status: Never Smoker  . Smokeless tobacco: Never Used  . Alcohol use No  . Drug use: No  . Sexual activity: Not on file   Other Topics Concern  . Not on file   Social History Narrative   Lives in [place] with spouse.  Retired Theatre manager for [employer].    Current outpatient meds: Medications reviewed and reconciled.  Current Meds  Medication Sig  . ACCU-CHEK  AVIVA PLUS test strip   . acetaminophen (TYLENOL) 500 MG tablet Take 500 mg by mouth daily as needed for mild pain. (pain/headaches)  . aspirin EC 81 MG tablet Take 81 mg by mouth at bedtime.  . calcium acetate (PHOSLO) 667 MG capsule Take 2 capsules by mouth 3 (three) times daily.  . carvedilol (COREG) 25 MG tablet Take 1 tablet (25 mg total) by mouth 2 (two) times daily with a meal.  . ezetimibe (ZETIA) 10 MG tablet Take 10 mg by mouth daily.  . fenofibrate 160 MG tablet Take 160 mg by mouth daily.  . finasteride (PROSCAR) 5 MG tablet Take 5 mg by mouth daily.  . furosemide (LASIX) 80 MG tablet Take 40 mg by mouth daily.   Marland Kitchen glimepiride (AMARYL) 4 MG tablet Take 4 mg by  mouth 2 (two) times daily.   . Hypromellose (ARTIFICIAL TEARS OP) Place 1 drop into both eyes daily as needed (supplement).  Marland Kitchen ipratropium (ATROVENT) 0.03 % nasal spray Place 2 sprays into both nostrils daily.   . tamsulosin (FLOMAX) 0.4 MG CAPS capsule Take 0.4 mg by mouth 3 (three) times a week. Monday, Wednesday and Friday (at bedtime)    Current inpatient meds: Medications reviewed and reconciled.  Current Facility-Administered Medications  Medication Dose Route Frequency Provider Last Rate Last Dose  . acetaminophen (TYLENOL) tablet 650 mg  650 mg Oral Q4H PRN Samella Parr, NP       Or  . acetaminophen (TYLENOL) solution 650 mg  650 mg Per Tube Q4H PRN Samella Parr, NP       Or  . acetaminophen (TYLENOL) suppository 650 mg  650 mg Rectal Q4H PRN Samella Parr, NP      . aspirin EC tablet 81 mg  81 mg Oral QHS Samella Parr, NP   81 mg at 01/29/17 2004  . calcium acetate (PHOSLO) capsule 1,334 mg  1,334 mg Oral TID WC Samella Parr, NP   1,334 mg at 01/29/17 2004  . ezetimibe (ZETIA) tablet 10 mg  10 mg Oral Daily Samella Parr, NP      . fenofibrate tablet 160 mg  160 mg Oral Daily Samella Parr, NP      . finasteride (PROSCAR) tablet 5 mg  5 mg Oral Daily Samella Parr, NP      . glimepiride (AMARYL) tablet 4 mg  4 mg Oral BID Samella Parr, NP   4 mg at 01/29/17 2005  . heparin injection 5,000 Units  5,000 Units Subcutaneous Q8H Samella Parr, NP   5,000 Units at 01/30/17 0551  . insulin aspart (novoLOG) injection 0-5 Units  0-5 Units Subcutaneous QHS Erin Hearing L, NP      . insulin aspart (novoLOG) injection 0-9 Units  0-9 Units Subcutaneous TID WC Samella Parr, NP   1 Units at 01/30/17 [phone]  . ipratropium (ATROVENT) 0.06 % nasal spray 2 spray  2 spray Each Nare Daily Samella Parr, NP      . polyvinyl alcohol (LIQUIFILM TEARS) 1.4 % ophthalmic solution 2 drop  2 drop Both Eyes Daily PRN Samella Parr, NP        Allergies: Allergies   Allergen Reactions  . Amlodipine Besylate Swelling    angioedema  . Lisinopril Other (See Comments)    Renal insufficiency  . Statins Other (See Comments)    Muscle aches    ROS: As per HPI. A full 14-point review of systems was  performed and is otherwise unremarkable.   PE:  BP (!) 163/72 (BP Location: Left Arm)   Pulse 84   Temp 98.2 F (36.8 C) (Oral)   Resp 18   Ht 5\' 10"  (1.778 m)   Wt 87.3 kg (192 lb 6.4 oz)   SpO2 99%   BMI 27.61 kg/m   General: WDWN, no acute distress. He was initially asleep but roused easily to voice. AAO x4. Speech notable for mild dysarthria--this cleared some as he woke up more and more but did not fully resolve. No aphasia. Follows commands briskly. Affect is bright with congruent mood. Comportment is normal.  HEENT: Normocephalic. Neck supple without LAD. MMM, OP clear. Dentition good. Sclerae anicteric. No conjunctival injection.  CV: Regular, no murmur. Pacer in place. Carotid pulses full and symmetric, no bruits. Distal pulses 2+ and symmetric.  Lungs: CTAB.  Abdomen: Soft, obese, non-distended, non-tender. Bowel sounds present x4.  Extremities: No C/C/E. AV fistula with palpable thrill.  Neuro:  CN: Pupils are equal and round. They are symmetrically reactive from 3-->2 mm. EOMI without nystagmus. There is breakup of smooth pursuits in all directions. No reported diplopia. Facial sensation is intact to light touch. He appears to have some widening of the left palpebral fissure and flattening of the left nasolabial fold with slight asymmetry of the left side of his mouth when he smiles. Hearing is intact to conversational voice. Palate elevates symmetrically and uvula is midline. Voice is normal in tone, pitch and quality. Bilateral SCM and trapezii are 5/5. Tongue is midline with normal bulk and mobility.  Motor: Normal bulk, tone, and strength. No tremor or other abnormal movements. No drift, though he does not raise his left arm as high off the  bed as he does the right.  Sensation: Intact to light touch and pinprick.  DTRs: Brisk 2+, symmetric. Toes downgoing bilaterally. No pathologic reflexes.  Coordination: Finger-to-nose and heel-to-shin are without dysmetria. Finger taps are normal in amplitude and speed, no decrement.    Labs:  Lab Results  Component Value Date   WBC 11.0 (H) 01/29/2017   HGB 9.8 (L) 01/29/2017   HCT 30.0 (L) 01/29/2017   PLT 174 01/29/2017   GLUCOSE 234 (H) 01/29/2017   CHOL 176 01/30/2017   TRIG 242 (H) 01/30/2017   HDL 29 (L) 01/30/2017   LDLCALC 99 01/30/2017   ALT 15 (L) 01/29/2017   AST 13 (L) 01/29/2017   NA 134 (L) 01/29/2017   K 4.4 01/29/2017   CL 101 01/29/2017   CREATININE 6.82 (H) 01/29/2017   BUN 72 (H) 01/29/2017   CO2 21 (L) 01/29/2017   INR 1.02 01/29/2017   HGBA1C 7.8 (H) 01/31/2016   EtOH <5 Troponin 0.01 UDS negative UA notable for glucose >500, protein >300  Imaging:  I have personally and independently reviewed the [facility] without contrast from 01/29/17. This shows no obvious acute abnormality. There is mild diffuse generalized atrophy with mild chronic small vessel changes in the bihemispheric white matter.   Assessment and Plan:  1. Possible acute ischemic stroke: Presentation could represent TIA vs stroke, with his report of persistent slurred speech and evidence of mild left facial droop raising more concern for stroke. The concurrent diaphoresis and dizziness raises concern for possible near-syncope due to cardiac or BP anomaly. Agree with TTE, carotids. He is not able to have MRI due to his pacer. Hemoglobin a1c pending. LDL not at goal, presently 99. He was on aspirin prior to this event so I  would stop this and switch to Plavix. Known risk factors for cerebrovascular disease in this patient include afib, DM, HTN, hyperlipidemia, h/o TIA, ESRD, obesity, and age. Further testing will be determined by results from these initial studies. Continue statin with goal LDL less  than 70. Ensure adequate glucose control. Allow permissive hypertension in the acute phase, treating only SBP greater than 220 mmHg and/or DBP greater than 110 mmHg. Avoid fever and hyperglycemia as these can extend the infarct. Avoid hypotonic IVF to minimize exacerbation of post-stroke edema. Initiate rehab services. DVT prophylaxis as needed.   2. Left facial droop: This is apparently new per patient, concerning for stroke as above. This is very mild. No intervention, follow exam.   3. Dysarthria: This is new per patient, suggestive of stroke as above. Notes from yesterday indicated that his wife felt that his speech had returned to his baseline but the pateint tells me that he has had some slurred speech since yesterday morning. SLP as needed.   4. LUE numbness: Acute, due to TIA/stroke as above. This has resolved, no issues at present.   No family was present at the time of my visit.   This was discussed with the patient. Education was provided on the diagnosis and expected evaluation and treatment. He is in agreement with the plan as noted. He was given the opportunity to ask any questions and these were addressed to his satisfaction.   Thank you for the opportunity to participate in Mr. Beaumier' care. The stroke team will assume care of the patient starting 01/31/17. Please call if any urgent questions or concerns.

## 2017-01-30 NOTE — Care Management Obs Status (Signed)
[place] NOTIFICATION   Patient Details  Name: James David MRN: [id number] Date of Birth: 16-Jul-1936   Medicare Observation Status Notification Given:  Yes    Pollie Friar, RN 01/30/2017, 3:54 PM

## 2017-01-30 NOTE — Care Management Note (Signed)
Case Management Note  Patient Details  Name: James David MRN: [id number] Date of Birth: 03-31-1936  Subjective/Objective:   Pt in with TIA. He has history:  ESRD (newly started 01/14/17), Type 2 DM, HTN, Hx pacemaker ( for 2nd degree heart block), A-Fib (not anticoagulated. He goes to [facility] in [facility] for dialysis on TTHS. He is from home with his spouse.                   Action/Plan: Awaiting PT/OT recommendations. CM following for d/c needs, physician orders.   Expected Discharge Date:                  Expected Discharge Plan:     In-House Referral:     Discharge planning Services     Post Acute Care Choice:    Choice offered to:     DME Arranged:    DME Agency:     HH Arranged:    HH Agency:     Status of Service:  In process, will continue to follow  If discussed at Long Length of Stay Meetings, dates discussed:    Additional Comments:  Pollie Friar, RN 01/30/2017, 11:38 AM

## 2017-01-30 NOTE — Evaluation (Signed)
Physical Therapy Evaluation Patient Details Name: DEAVIN FORST MRN: [id number] DOB: 12/16/1935 Today's Date: 01/30/2017   History of Present Illness  Pt is 81 y/o male admitted secondary to suspected TIA. Pt with dysarthria, facial droop , syncopal episode and weakness. PMH includes a fib, CKD on dialysis, HTN, DM with neuropathy, and s/p pcemaker placement and AV fistula placement.   Clinical Impression  Pt admitted secondary to problems above with deficits below. PTA, pt was independent without AD for functional mobility. Upon evaluation, pt with heavy posterior and L lateral lean in sitting and standing with ataxia and decreased balance in standing. Pt requiring from mod to max A for functional mobility this session. Educated pt and family about CIR recommendations and everyone agreeable. Pt has excellent family support and is very motivated to get back to being independent. Will continue to follow and progress mobility according to pt tolerance.     Follow Up Recommendations CIR;Supervision/Assistance - 24 hour    Equipment Recommendations  None recommended by PT    Recommendations for Other Services       Precautions / Restrictions Precautions Precautions: Fall Restrictions Weight Bearing Restrictions: No      Mobility  Bed Mobility Overal bed mobility: Needs Assistance Bed Mobility: Supine to Sit;Sit to Supine     Supine to sit: Mod assist Sit to supine: Min assist   General bed mobility comments: Mod A for trunk elevation during supine>sit. Pt with heavy posterior lean and L lateral lean. Min A for return to supine to control descent.   Transfers Overall transfer level: Needs assistance Equipment used: Rolling walker (2 wheeled) Transfers: Sit to/from Stand Sit to Stand: Mod assist;Max assist         General transfer comment: Attempted standing X2. Able to get to full standing on second attempt with max A. Pt demonstrating posterior and L lateral lean in  standing requiring verbal cues to correct.   Ambulation/Gait Ambulation/Gait assistance: Mod assist Ambulation Distance (Feet): 1 Feet Assistive device: Rolling walker (2 wheeled) Gait Pattern/deviations: Step-to pattern;Decreased stride length;Ataxic;Trunk flexed;Wide base of support Gait velocity: Decreased Gait velocity interpretation: Below normal speed for age/gender General Gait Details: Attempted side stepping along bed with RW. Required mod A secondary to decreased balance and posterior/L lateral lean. Verbal cues for midline orientation and sequencing using RW.   Stairs            Wheelchair Mobility    Modified Rankin (Stroke Patients Only)       Balance Overall balance assessment: Needs assistance Sitting-balance support: Bilateral upper extremity supported;Feet supported Sitting balance-Leahy Scale: Poor Sitting balance - Comments: min guard to mod A to maintain sitting balance secondary to posterior and L lateral lean.  Postural control: Posterior lean;Left lateral lean Standing balance support: Bilateral upper extremity supported;During functional activity Standing balance-Leahy Scale: Poor Standing balance comment: Reliant on RW and mod A for standing balance.                              Pertinent Vitals/Pain Pain Assessment: Faces Faces Pain Scale: No hurt    Home Living Family/patient expects to be discharged to:: Private residence Living Arrangements: Spouse/significant other Available Help at Discharge: Family;Available 24 hours/day Type of Home: Other(Comment) (townhome ) Home Access: Level entry     Home Layout: One level Home Equipment: None      Prior Function Level of Independence: Independent  Comments: Reports he was not using AD      Hand Dominance        Extremity/Trunk Assessment   Upper Extremity Assessment Upper Extremity Assessment: Defer to OT evaluation    Lower Extremity Assessment Lower  Extremity Assessment: RLE deficits/detail;LLE deficits/detail RLE Deficits / Details: Ataxia noted with alternating toe tapping, and slight ataxia noted with heel to shin test. Grossly 3/5 throughout.  LLE Deficits / Details: Ataxia noted with alternating toe tapping, and slight ataxia noted with heel to shin test. Grossly 3/5 throughout.     Cervical / Trunk Assessment Cervical / Trunk Assessment: Kyphotic  Communication   Communication: Other (comment) (slightly slurred speech )  Cognition Arousal/Alertness: Awake/alert Behavior During Therapy: WFL for tasks assessed/performed Overall Cognitive Status: Within Functional Limits for tasks assessed                                        General Comments General comments (skin integrity, edema, etc.): Educated about need for further PT following d/c in skilled facility. Educated about CIR as pt has good family support and is motivated to get back to independence. Pt and family agreeable.    Exercises     Assessment/Plan    PT Assessment Patient needs continued PT services  PT Problem List Decreased strength;Decreased balance;Decreased mobility;Decreased coordination;Decreased knowledge of use of DME;Decreased knowledge of precautions       PT Treatment Interventions DME instruction;Gait training;Functional mobility training;Therapeutic activities;Therapeutic exercise;Balance training;Neuromuscular re-education;Patient/family education    PT Goals (Current goals can be found in the Care Plan section)  Acute Rehab PT Goals Patient Stated Goal: to regain independence PT Goal Formulation: With patient Time For Goal Achievement: 02/13/17 Potential to Achieve Goals: Good    Frequency Min 3X/week   Barriers to discharge        Co-evaluation               AM-PAC PT "6 Clicks" Daily Activity  Outcome Measure Difficulty turning over in bed (including adjusting bedclothes, sheets and blankets)?:  Total Difficulty moving from lying on back to sitting on the side of the bed? : Total Difficulty sitting down on and standing up from a chair with arms (e.g., wheelchair, bedside commode, etc,.)?: Total Help needed moving to and from a bed to chair (including a wheelchair)?: A Lot Help needed walking in hospital room?: Total Help needed climbing 3-5 steps with a railing? : Total 6 Click Score: 7    End of Session Equipment Utilized During Treatment: Gait belt Activity Tolerance: Patient tolerated treatment well Patient left: in bed;with call bell/phone within reach;with bed alarm set;with family/visitor present Nurse Communication: Mobility status PT Visit Diagnosis: Other abnormalities of gait and mobility (R26.89);Ataxic gait (R26.0);Unsteadiness on feet (R26.81)    Time: 9449-6759 PT Time Calculation (min) (ACUTE ONLY): 22 min   Charges:   PT Evaluation $PT Eval Moderate Complexity: 1 Procedure PT Treatments $Therapeutic Activity: 8-22 mins   PT G Codes:   PT G-Codes **NOT FOR INPATIENT CLASS** Functional Assessment Tool Used: AM-PAC 6 Clicks Basic Mobility;Clinical judgement Functional Limitation: Mobility: Walking and moving around Mobility: Walking and Moving Around Current Status (F6384): At least 80 percent but less than 100 percent impaired, limited or restricted Mobility: Walking and Moving Around Goal Status [phone]): At least 1 percent but less than 20 percent impaired, limited or restricted    Nicky Pugh, PT, DPT  Acute  Rehabilitation Services  Pager: [phone]   Army Melia 01/30/2017, 4:01 PM

## 2017-01-30 NOTE — Progress Notes (Signed)
Rehab Admissions Coordinator Note:  Patient was screened by Cleatrice Burke for appropriateness for an Inpatient Acute Rehab Consult per PT recommendation. Noted pt observation status.   At this time, we are recommending await further neuro workup for possible CVA. If CVA, would reocmmend an inpt rehab conuslt for possible admisison.Cleatrice Burke 01/30/2017, 4:07 PM  I can be reached at [phone].

## 2017-01-30 NOTE — Evaluation (Signed)
Occupational Therapy Evaluation Patient Details Name: James David MRN: [id number] DOB: 16-Jan-1936 Today's Date: 01/30/2017    History of Present Illness Pt is 81 y/o male admitted secondary to suspected TIA. Pt with dysarthria, facial droop , syncopal episode and weakness. PMH includes a fib, CKD on dialysis, HTN, DM with neuropathy, and s/p pcemaker placement and AV fistula placement.    Clinical Impression   PTA, pt was independent with ADL and functional mobility. He lives with his wife and has good support from his family. Pt currently requires mod assist for simulated toilet transfers, mod assist for LB ADL, and min assist for grooming tasks. He presents with decreased dynamic sitting and standing balance, decreased L UE coordination, and signs of ataxia during mobility limiting his ability to participate in ADL at [facility] Pt would benefit from continued OT services while admitted to improve independence with ADL and functional mobility. Pt is an excellent candidate for CIR level rehabilitation post-acute D/C in order to maximize return to independent PLOF. OT will continue to follow while admitted.     Follow Up Recommendations  CIR;Supervision/Assistance - 24 hour    Equipment Recommendations  Other (comment) (TBD at next venue of care)    Recommendations for Other Services Rehab consult     Precautions / Restrictions Precautions Precautions: Fall Restrictions Weight Bearing Restrictions: No      Mobility Bed Mobility Overal bed mobility: Needs Assistance Bed Mobility: Supine to Sit;Sit to Supine     Supine to sit: Mod assist Sit to supine: Mod assist   General bed mobility comments: Mod assist for elevating trunk during supine<>sti and managing B LEs during sit<>supine.  Transfers Overall transfer level: Needs assistance Equipment used: Rolling walker (2 wheeled) Transfers: Sit to/from Stand Sit to Stand: Mod assist         General transfer comment: Able to  complete sit<>stand during ADL with heavy mod lifting assist. Significant posterior and L lateral lean with pt demonstrating difficulty moving L UE from bed to RW. Feel this may be a combination of coordination deficits and fear of falling.    Balance Overall balance assessment: Needs assistance Sitting-balance support: Bilateral upper extremity supported;Feet supported Sitting balance-Leahy Scale: Poor Sitting balance - Comments: Min guard to mod A to maintain sitting balance secondary to posterior and L lateral lean.  Postural control: Posterior lean;Left lateral lean Standing balance support: Bilateral upper extremity supported;During functional activity Standing balance-Leahy Scale: Poor Standing balance comment: Relies on B UE support and mod external assist for static and dynamic standing balance.                            ADL either performed or assessed with clinical judgement   ADL Overall ADL's : Needs assistance/impaired Eating/Feeding: Sitting;Supervision/ safety   Grooming: Minimal assistance;Sitting   Upper Body Bathing: Min guard;Sitting   Lower Body Bathing: Moderate assistance;Sit to/from stand   Upper Body Dressing : Min guard;Sitting   Lower Body Dressing: Moderate assistance;Sit to/from stand   Toilet Transfer: Moderate assistance;RW Toilet Transfer Details (indicate cue type and reason): Simualted with sit<>stand followed by 2 steps ambulation. Additionally able to complete side steps toward head of bed. Toileting- Clothing Manipulation and Hygiene: Moderate assistance;Sit to/from stand       Functional mobility during ADLs: Moderate assistance General ADL Comments: Limited functional mobility due to instability. Pt falling to the L in standing and with poor dynamic sitting balance during ADL.  Vision Patient Visual Report: No change from baseline Vision Assessment?: Yes Eye Alignment: Within Functional Limits Ocular Range of Motion:  Within Functional Limits Alignment/Gaze Preference: Within Defined Limits Tracking/Visual Pursuits:  (Difficulty holding eye position at B end range inferiorly) Saccades: Within functional limits Visual Fields: No apparent deficits Additional Comments: Pt able to functionally use vision during session. Difficulties noted with tracking inferiorly specifically with holding eye position bilaterally at end range in superior quadrants.      Perception     Praxis      Pertinent Vitals/Pain Pain Assessment: Faces Faces Pain Scale: No hurt     Hand Dominance     Extremity/Trunk Assessment Upper Extremity Assessment Upper Extremity Assessment: LUE deficits/detail;Generalized weakness LUE Deficits / Details: Signs of ataxia and dysmetria during movement.    Lower Extremity Assessment Lower Extremity Assessment: Defer to PT evaluation RLE Deficits / Details: Ataxia noted with alternating toe tapping, and slight ataxia noted with heel to shin test. Grossly 3/5 throughout.  LLE Deficits / Details: Ataxia noted with alternating toe tapping, and slight ataxia noted with heel to shin test. Grossly 3/5 throughout.    Cervical / Trunk Assessment Cervical / Trunk Assessment: Kyphotic   Communication Communication Communication: Other (comment) (slightly slurred speech)   Cognition Arousal/Alertness: Awake/alert Behavior During Therapy: WFL for tasks assessed/performed Overall Cognitive Status: Within Functional Limits for tasks assessed                                     General Comments  Educated about need for further PT following d/c in skilled facility. Educated about CIR as pt has good family support and is motivated to get back to independence. Pt and family agreeable.    Exercises     Shoulder Instructions      Home Living Family/patient expects to be discharged to:: Private residence Living Arrangements: Spouse/significant other Available Help at Discharge:  Family;Available 24 hours/day Type of Home: Other(Comment) (townhome) Home Access: Level entry     Home Layout: One level     Bathroom Shower/Tub: Occupational psychologist: Handicapped height Bathroom Accessibility: Yes   Home Equipment: Grab bars - tub/shower          Prior Functioning/Environment Level of Independence: Independent        Comments: Reports he was not using AD         OT Problem List: Decreased strength;Decreased activity tolerance;Impaired balance (sitting and/or standing);Decreased safety awareness;Decreased knowledge of use of DME or AE;Decreased knowledge of precautions;Decreased coordination;Impaired UE functional use      OT Treatment/Interventions: Self-care/ADL training;Therapeutic exercise;Energy conservation;DME and/or AE instruction;Therapeutic activities;Patient/family education;Balance training    OT Goals(Current goals can be found in the care plan section) Acute Rehab OT Goals Patient Stated Goal: to regain independence OT Goal Formulation: With patient/family Time For Goal Achievement: 02/13/17 Potential to Achieve Goals: Good ADL Goals Pt Will Perform Grooming: with supervision;standing Pt Will Perform Upper Body Dressing: with modified independence;sitting Pt Will Perform Lower Body Dressing: with supervision;sit to/from stand Pt Will Transfer to Toilet: with supervision;ambulating;bedside commode (BSC over toilet) Pt Will Perform Toileting - Clothing Manipulation and hygiene: with supervision;sit to/from stand Pt Will Perform Tub/Shower Transfer: with min guard assist;Shower transfer;ambulating;shower seat;rolling walker;3 in 1 Pt/caregiver will Perform Home Exercise Program: Left upper extremity;With Supervision;With written HEP provided (L UE coordination) Additional ADL Goal #1: Pt will complete bed mobility in preparation for seated  ADL at EOB with supervision.  OT Frequency: Min 3X/week   Barriers to D/C:             Co-evaluation              AM-PAC PT "6 Clicks" Daily Activity     Outcome Measure Help from another person eating meals?: A Little Help from another person taking care of personal grooming?: A Little Help from another person toileting, which includes using toliet, bedpan, or urinal?: A Lot Help from another person bathing (including washing, rinsing, drying)?: A Lot Help from another person to put on and taking off regular upper body clothing?: A Little Help from another person to put on and taking off regular lower body clothing?: A Lot 6 Click Score: 15   End of Session Equipment Utilized During Treatment: Rolling walker;Gait belt  Activity Tolerance: Patient tolerated treatment well Patient left: in bed;with call bell/phone within reach;with family/visitor present  OT Visit Diagnosis: Other abnormalities of gait and mobility (R26.89);Ataxia, unspecified (R27.0);Unsteadiness on feet (R26.81);Muscle weakness (generalized) (M62.81)                Time: 1400-1420 OT Time Calculation (min): 20 min Charges:  OT General Charges $OT Visit: 1 Procedure OT Evaluation $OT Eval Moderate Complexity: 1 Procedure G-Codes:     Norman Herrlich, MS OTR/L  Pager: James David 01/30/2017, 5:08 PM

## 2017-01-30 NOTE — Progress Notes (Signed)
[facility] PROGRESS NOTE  James David:[id number] DOB: 09/05/1936 DOA: 01/29/2017  PCP: [name], MD  Brief History/Interval Summary: 81 year old male with a past medical history of atrial flutter, followed by cardiology, but not on anticoagulation due to low burden, history of pacemaker placement, history of chronic kidney disease, who started dialysis recently, diabetes mellitus, hypertension, presented with sudden onset of diaphoresis, dizziness, left arm weakness and slurred speech. Concern was for TIA versus stroke. Patient was hospitalized for further management.  Reason for Visit: Possible stroke  Consultants: Neurology. Nephrology  Procedures: Transthoracic echocardiogram is pending. Carotid Dopplers pending  Antibiotics: None  Subjective/Interval History: Patient has slurred speech this morning. His wife is at the bedside and mentions that his symptoms appear to have gotten worse overnight. Patient denies any headaches. No visual disturbances.  ROS: No nausea or vomiting  Objective:  Vital Signs  Vitals:   01/30/17 0200 01/30/17 0400 01/30/17 0600 01/30/17 0931  BP: (!) 154/65 (!) 160/71 (!) 163/72 (!) 153/60  Pulse: 86 78 84 77  Resp:    18  Temp:   98.2 F (36.8 C) 98.1 F (36.7 C)  TempSrc:   Oral Oral  SpO2: 98% 97% 99% 99%  Weight:      Height:        Intake/Output Summary (Last 24 hours) at 01/30/17 1203 Last data filed at 01/29/17 2242  Gross per 24 hour  Intake                0 ml  Output              200 ml  Net             -200 ml   Filed Weights   01/29/17 1811  Weight: 87.3 kg (192 lb 6.4 oz)    General appearance: alert, cooperative, appears stated age and no distress Head: Normocephalic, without obvious abnormality, atraumatic Resp: clear to auscultation bilaterally Cardio: regular rate and rhythm, S1, S2 normal, no murmur, click, rub or gallop GI: soft, non-tender; bowel sounds normal; no masses,  no  organomegaly Extremities: extremities normal, atraumatic, no cyanosis or edema Neurologic: Awake and alert. Dysarthria is noted. Left-sided facial asymmetry is present. Tongue appears to be midline. No obvious pronator drift, though he is noted not to raise his left arm as high as the right. Strength appears to be equal bilateral upper and lower extremities. Finger-to-nose is normal.  Lab Results:  Data Reviewed: I have personally reviewed following labs and imaging studies  CBC:  Recent Labs Lab 01/29/17 1139  WBC 11.0*  NEUTROABS 9.2*  HGB 9.8*  HCT 30.0*  MCV 91.7  PLT 154    Basic Metabolic Panel:  Recent Labs Lab 01/29/17 1139  NA 134*  K 4.4  CL 101  CO2 21*  GLUCOSE 234*  BUN 72*  CREATININE 6.82*  CALCIUM 8.6*    GFR: Estimated Creatinine Clearance: 9.2 mL/min (A) (by C-G formula based on SCr of 6.82 mg/dL (H)).  Liver Function Tests:  Recent Labs Lab 01/29/17 1139  AST 13*  ALT 15*  ALKPHOS 65  BILITOT 0.5  PROT 5.5*  ALBUMIN 2.9*     Coagulation Profile:  Recent Labs Lab 01/29/17 1139  INR 1.02    CBG:  Recent Labs Lab 01/29/17 2101 01/30/17 0611 01/30/17 1142  GLUCAP 178* 125* 154*    Lipid Profile:  Recent Labs  01/30/17 0424  CHOL 176  HDL 29*  LDLCALC 99  TRIG  242*  CHOLHDL 6.1    Radiology Studies: Dg Chest 2 View  Result Date: 01/29/2017 CLINICAL DATA:  Dizziness EXAM: CHEST  2 VIEW COMPARISON:  01/21/2014 FINDINGS: Pacing device is again seen and stable. Cardiac shadow is stable. Vascular stent is noted in the subclavian region on the right. The lungs are clear bilaterally. Postsurgical changes in the left axilla are noted. IMPRESSION: No acute abnormality noted. Electronically Signed   By: Inez Catalina M.D.   On: 01/29/2017 12:13   Ct Head Wo Contrast  Result Date: 01/29/2017 CLINICAL DATA:  Dizziness and slurred speech. Chronic renal failure. History of melanoma EXAM: CT HEAD WITHOUT CONTRAST TECHNIQUE:  Contiguous axial images were obtained from the base of the skull through the vertex without intravenous contrast. COMPARISON:  None. FINDINGS: Brain: There is mild diffuse atrophy. Left lateral ventricle is slightly larger than right lateral ventricle, a probable anatomic variant. There is no intracranial mass, hemorrhage, extra-axial fluid collection, or midline shift. There is patchy small vessel disease in the centra semiovale bilaterally. Elsewhere gray-white compartments appear normal. There is no evident acute appearing infarct. Vascular: There is no demonstrable hyperdense vessel. There is calcification in each carotid siphon region. There is also calcification in each distal vertebral artery. Skull: Bony calvarium appears intact. Sinuses/Orbits: There is mild mucosal thickening in the medial right maxillary antrum. There is mucosal thickening in several ethmoid air cells bilaterally. Other visualized paranasal sinuses are clear. Orbits appear symmetric bilaterally. Other: Mastoid air cells are clear. There is debris in each external auditory canal. IMPRESSION: Mild atrophy with patchy periventricular small vessel disease. No intracranial mass, hemorrhage, or extra-axial fluid collection. No acute appearing infarct evident. There are foci of arteriovascular calcification bilaterally. There are areas of mild paranasal sinus disease. There is probable cerumen in each external auditory canal. Electronically Signed   By: Lowella Grip III M.D.   On: 01/29/2017 14:39     Medications:  Scheduled: . calcium acetate  1,334 mg Oral TID WC  . clopidogrel  75 mg Oral Daily  . doxercalciferol  4 mcg Intravenous Q T,Th,Sa-HD  . ezetimibe  10 mg Oral Daily  . feeding supplement (PRO-STAT SUGAR FREE 64)  30 mL Oral BID  . fenofibrate  160 mg Oral Daily  . finasteride  5 mg Oral Daily  . heparin  5,000 Units Subcutaneous Q8H  . insulin aspart  0-5 Units Subcutaneous QHS  . insulin aspart  0-9 Units  Subcutaneous TID WC  . ipratropium  2 spray Each Nare Daily   Continuous:  UXN:ATFTDDUKGURKY **OR** acetaminophen (TYLENOL) oral liquid 160 mg/5 mL **OR** acetaminophen, polyvinyl alcohol  Assessment/Plan:  Principal Problem:   TIA (transient ischemic attack) Active Problems:   Hypertension   CKD (chronic kidney disease) stage V requiring chronic dialysis (HCC)   DDD (degenerative disc disease)   HLD (hyperlipidemia)   Second degree heart block   Atrial flutter (HCC)   ESRD (end stage renal disease) on dialysis ([place])    Possible acute stroke Due to presence of pacemaker, patient could not undergo MRI. Due to recent initiation of dialysis CT with contrast was not done last night. Patient's wife thinks that his symptoms are worse this morning after having improved yesterday evening. In view of this we will repeat a CT scan. Neurology is following. Stroke workup is in progress. Await echocardiogram and carotid Doppler. Patient was on aspirin at home and has been changed over to Plavix. However, with his history of atrial flutter in this patient,  and he has not been on anticoagulation due to low burden noted on pacemaker interrogation. If neurology does feel that the patient has had a stroke, we'll need to discuss with his cardiologist regarding initiation of anticoagulation. PT and OT evaluation. LDL is 99. He has allergies to statin. He is on Zetia and fenofibrate.  End-stage renal disease on hemodialysis. Started dialysis recently. Apparently he is on Tuesday, Thursday, Saturday schedule. Nephrology is following.  History of essential hypertension. Permissive hypertension due to stroke.  Anemia of chronic kidney disease. Hemoglobin 9.8. Continue to monitor.  History of atrial flutter. Monitor on telemetry. See discussion above. Check EKG.  History of pacemaker placement This was placed in 2015 for Mobitz type II. Followed by electrophysiologist.  Hyperlipidemia. Reports  intolerance to statin medications. He is on Zetia and fenofibrate. LDL is 99.  Diabetes mellitus type 2. Patient noted to be on oral medications at home. Continue to monitor CBGs. Hold Amaryl. SSI. HbA1c is pending.  DVT Prophylaxis: Subcutaneous heparin    Code Status: Full code  Family Communication: Discussed with the patient and his wife  Disposition Plan: Management as outlined above.    LOS: 0 days   [facility] Pager [phone] 01/30/2017, 12:03 PM  If 7PM-7AM, please contact night-coverage at [email or web address], password [facility]

## 2017-01-30 NOTE — Consult Note (Signed)
[facility] Renal Consultation Note    Indication for Consultation:  Management of ESRD/hemodialysis, anemia, hypertension/volume, and secondary hyperparathyroidism. PCP:  HPI: James David is a 81 y.o. male with ESRD (newly started 01/14/17), Type 2 DM, HTN, Hx pacemaker ( for 2nd degree heart block), A-Fib (not anticoagulated) who was admitted with possible TIA.   Symptoms started yesterday morning while at breakfast, noted L arm weakness/tingling and slurred speech which lasted roughly 20 minutes. No Hx CVA. In ED, CT head with no acute findings. Labs showed Hgb 11, Hgb 9.8, K 4.4, CO2 21, glucose 234, urinalysis with +glucose/prot only. CXR clear.  Today, feels ok. Neuro has consulted, echo/carotid u/s pending. Denies fever, chills, CP, dyspnea, N/V, diarrhea, or edema. Speech is clear today.  From renal standpoint, dialyzes TTS at [facility], just started HD 2 weeks ago and was having AVF cannulation issues to start, so really has had only 1 full treatment (5/15). He underwent declot/fistulogram of L AVF on 5/14. Looks useable today, albeit very bruised.  Past Medical History:  Diagnosis Date  . Atrial fibrillation ([place])   . Chronic kidney disease, stage IV (severe) (HCC)    baseline creatinine 2-3  . DDD (degenerative disc disease)   . Diabetes mellitus without complication ([place])   . Hyperlipidemia   . Hypertension   . Melanoma ([place]) 2010   removed at [facility]  . Second degree heart block    a. s/p STJ dual chamber PPM - Dr [name]   Past Surgical History:  Procedure Laterality Date  . AV FISTULA PLACEMENT Right 05/31/2014   Procedure: Right Arm Brachiocephalic ARTERIOVENOUS FISTULA CREATION  ;  Surgeon: Conrad Strathmore, MD;  Location: [place];  Service: Vascular;  Laterality: Right;  . CATARACT EXTRACTION W/ INTRAOCULAR LENS  IMPLANT, BILATERAL    . COLONOSCOPY    . EXCISION MELANOMA WITH SENTINEL LYMPH NODE BIOPSY Right 02/08/2016   Procedure: WIDE EXCISION  RIGHT SHOULDER MELANOMA WITH RIGHT SENTINEL LYMPH NODE BIOPSY;  Surgeon: [name], MD;  Location: [place];  Service: General;  Laterality: Right;  . LAMINECTOMY    . PERMANENT PACEMAKER INSERTION N/A 01/20/2014   STJ Assurity dual chamber pacemaker implanted by Dr [name] for 2nd degree AV block   Family History  Problem Relation Age of Onset  . Pulmonary embolism Mother   . Diabetes Mother   . Heart attack Father   . Diabetes Brother    Social History:  reports that he has never smoked. He has never used smokeless tobacco. He reports that he does not drink alcohol or use drugs.  ROS: As per HPI otherwise negative.  Physical Exam: Vitals:   01/30/17 0200 01/30/17 0400 01/30/17 0600 01/30/17 0931  BP: (!) 154/65 (!) 160/71 (!) 163/72 (!) 153/60  Pulse: 86 78 84 77  Resp:    18  Temp:   98.2 F (36.8 C) 98.1 F (36.7 C)  TempSrc:   Oral Oral  SpO2: 98% 97% 99% 99%  Weight:      Height:         General: Well developed, well nourished, in no acute distress. Head: Normocephalic, atraumatic, sclera non-icteric, mucus membranes are moist. Neck: Supple without lymphadenopathy/masses.  Lungs: Clear bilaterally to auscultation without wheezes, rales, or rhonchi. Breathing is unlabored. Heart: RRR; no murmur. Abdomen: Soft, non-tender, non-distended. Musculoskeletal:  Strength and tone appear normal for age. Lower extremities: No edema or ischemic changes, no open wounds. Neuro: Alert and oriented X 3. Moves all  extremities spontaneously. Psych:  Responds to questions appropriately with a normal affect. Dialysis Access: RUE AVF + extensive bruising present, but good bruit.  CXR - no acute changes   Dialysis Orders:  TTS at [facility] HD time increasing with each HD (3:30 ordered), BFR 300, DFR 800, EDW 85.5kg, 2K/2.25Ca, AVF, No heparin. - Hectoral 68mcg IV q HD - Venofer 50mg  IV weekly  Assessment/Plan: 1.  ?TIA v. acute CVA: Per neuro. 2.  ESRD: Continue TTS schedule,  next HD today (5/17). No heparin. 3.  Hypertension/volume: Allowing permissive HTN per neuro. Low goal UF today. 4.  Anemia: Hgb 9.8. Monitor today.  5.  Metabolic bone disease: Ca ok. Continue Phoslo as binder. Start VDRA. 6.  Nutrition:  Alb low. Start protein supps.  Veneta Penton, PA-C 01/30/2017, 10:14 AM  [facility] Pager: [phone]  Pt seen, examined and agree w A/P as above. ESRD pt recently started HD 2 wks ago admitted for L sided weakness and slurred speech. Hx of afib (not on AC) and PPM.  Symptoms have resolved, undergoing w/u for CVA/ TIA.  Plan HD today, stable from renal standpoint, up 2kg and lytes are wnl.   Kelly Splinter MD [facility] pager [phone]   01/30/2017, 4:57 PM    Allergies  Allergen Reactions  . Amlodipine Besylate Swelling    angioedema  . Lisinopril Other (See Comments)    Renal insufficiency  . Statins Other (See Comments)    Muscle aches   Prior to Admission medications   Medication Sig Start Date End Date Taking? Authorizing Provider  ACCU-CHEK AVIVA PLUS test strip  02/08/14  Yes [provider]  acetaminophen (TYLENOL) 500 MG tablet Take 500 mg by mouth daily as needed for mild pain. (pain/headaches)   Yes [provider]  aspirin EC 81 MG tablet Take 81 mg by mouth at bedtime.   Yes [provider]  calcium acetate (PHOSLO) 667 MG capsule Take 2 capsules by mouth 3 (three) times daily. 01/22/17  Yes [provider]  carvedilol (COREG) 25 MG tablet Take 1 tablet (25 mg total) by mouth 2 (two) times daily with a meal. 11/23/15  Yes Belva Crome, MD  ezetimibe (ZETIA) 10 MG tablet Take 10 mg by mouth daily. 12/11/16  Yes [provider]  fenofibrate 160 MG tablet Take 160 mg by mouth daily.   Yes [provider]  finasteride (PROSCAR) 5 MG tablet Take 5 mg by mouth daily. 04/29/16  Yes [provider]  furosemide (LASIX) 80 MG tablet Take 40 mg by  mouth daily.    Yes [provider]  glimepiride (AMARYL) 4 MG tablet Take 4 mg by mouth 2 (two) times daily.    Yes [provider]  Hypromellose (ARTIFICIAL TEARS OP) Place 1 drop into both eyes daily as needed (supplement).   Yes [provider]  ipratropium (ATROVENT) 0.03 % nasal spray Place 2 sprays into both nostrils daily.    Yes [provider]  tamsulosin (FLOMAX) 0.4 MG CAPS capsule Take 0.4 mg by mouth 3 (three) times a week. Monday, Wednesday and Friday (at bedtime)   Yes [provider]   Current Facility-Administered Medications  Medication Dose Route Frequency Provider Last Rate Last Dose  . acetaminophen (TYLENOL) tablet 650 mg  650 mg Oral Q4H PRN Samella Parr, NP       Or  . acetaminophen (TYLENOL) solution 650 mg  650 mg Per Tube Q4H PRN Erin Hearing  L, NP       Or  . acetaminophen (TYLENOL) suppository 650 mg  650 mg Rectal Q4H PRN Samella Parr, NP      . calcium acetate (PHOSLO) capsule 1,334 mg  1,334 mg Oral TID WC Samella Parr, NP   1,334 mg at 01/30/17 0850  . clopidogrel (PLAVIX) tablet 75 mg  75 mg Oral Daily Darrel Reach, MD   75 mg at 01/30/17 0850  . ezetimibe (ZETIA) tablet 10 mg  10 mg Oral Daily Samella Parr, NP   10 mg at 01/30/17 0850  . fenofibrate tablet 160 mg  160 mg Oral Daily Samella Parr, NP   160 mg at 01/30/17 0851  . finasteride (PROSCAR) tablet 5 mg  5 mg Oral Daily Samella Parr, NP   5 mg at 01/30/17 0851  . heparin injection 5,000 Units  5,000 Units Subcutaneous Q8H Samella Parr, NP   5,000 Units at 01/30/17 0551  . insulin aspart (novoLOG) injection 0-5 Units  0-5 Units Subcutaneous QHS Erin Hearing L, NP      . insulin aspart (novoLOG) injection 0-9 Units  0-9 Units Subcutaneous TID WC Samella Parr, NP   1 Units at 01/30/17 [phone]  . ipratropium (ATROVENT) 0.06 % nasal spray 2 spray  2 spray Each Nare Daily Samella Parr, NP      . polyvinyl alcohol  (LIQUIFILM TEARS) 1.4 % ophthalmic solution 2 drop  2 drop Both Eyes Daily PRN Samella Parr, NP       Labs: Basic Metabolic Panel:  Recent Labs Lab 01/29/17 1139  NA 134*  K 4.4  CL 101  CO2 21*  GLUCOSE 234*  BUN 72*  CREATININE 6.82*  CALCIUM 8.6*   Liver Function Tests:  Recent Labs Lab 01/29/17 1139  AST 13*  ALT 15*  ALKPHOS 65  BILITOT 0.5  PROT 5.5*  ALBUMIN 2.9*   CBC:  Recent Labs Lab 01/29/17 1139  WBC 11.0*  NEUTROABS 9.2*  HGB 9.8*  HCT 30.0*  MCV 91.7  PLT 174   CBG:  Recent Labs Lab 01/29/17 2101 01/30/17 0611  GLUCAP 178* 125*   Studies/Results: Dg Chest 2 View  Result Date: 01/29/2017 CLINICAL DATA:  Dizziness EXAM: CHEST  2 VIEW COMPARISON:  01/21/2014 FINDINGS: Pacing device is again seen and stable. Cardiac shadow is stable. Vascular stent is noted in the subclavian region on the right. The lungs are clear bilaterally. Postsurgical changes in the left axilla are noted. IMPRESSION: No acute abnormality noted. Electronically Signed   By: Inez Catalina M.D.   On: 01/29/2017 12:13   Ct Head Wo Contrast  Result Date: 01/29/2017 CLINICAL DATA:  Dizziness and slurred speech. Chronic renal failure. History of melanoma EXAM: CT HEAD WITHOUT CONTRAST TECHNIQUE: Contiguous axial images were obtained from the base of the skull through the vertex without intravenous contrast. COMPARISON:  None. FINDINGS: Brain: There is mild diffuse atrophy. Left lateral ventricle is slightly larger than right lateral ventricle, a probable anatomic variant. There is no intracranial mass, hemorrhage, extra-axial fluid collection, or midline shift. There is patchy small vessel disease in the centra semiovale bilaterally. Elsewhere gray-white compartments appear normal. There is no evident acute appearing infarct. Vascular: There is no demonstrable hyperdense vessel. There is calcification in each carotid siphon region. There is also calcification in each distal vertebral  artery. Skull: Bony calvarium appears intact. Sinuses/Orbits: There is mild mucosal thickening in the medial right maxillary antrum. There is  mucosal thickening in several ethmoid air cells bilaterally. Other visualized paranasal sinuses are clear. Orbits appear symmetric bilaterally. Other: Mastoid air cells are clear. There is debris in each external auditory canal. IMPRESSION: Mild atrophy with patchy periventricular small vessel disease. No intracranial mass, hemorrhage, or extra-axial fluid collection. No acute appearing infarct evident. There are foci of arteriovascular calcification bilaterally. There are areas of mild paranasal sinus disease. There is probable cerumen in each external auditory canal. Electronically Signed   By: Lowella Grip III M.D.   On: 01/29/2017 14:39

## 2017-01-31 ENCOUNTER — Observation Stay (HOSPITAL_COMMUNITY): Payer: Medicare Other

## 2017-01-31 ENCOUNTER — Observation Stay (HOSPITAL_BASED_OUTPATIENT_CLINIC_OR_DEPARTMENT_OTHER): Payer: Medicare Other

## 2017-01-31 DIAGNOSIS — G459 Transient cerebral ischemic attack, unspecified: Secondary | ICD-10-CM | POA: Diagnosis not present

## 2017-01-31 DIAGNOSIS — N186 End stage renal disease: Secondary | ICD-10-CM | POA: Diagnosis not present

## 2017-01-31 DIAGNOSIS — I6340 Cerebral infarction due to embolism of unspecified cerebral artery: Secondary | ICD-10-CM | POA: Diagnosis not present

## 2017-01-31 DIAGNOSIS — G8194 Hemiplegia, unspecified affecting left nondominant side: Secondary | ICD-10-CM

## 2017-01-31 DIAGNOSIS — I10 Essential (primary) hypertension: Secondary | ICD-10-CM | POA: Diagnosis not present

## 2017-01-31 DIAGNOSIS — I4892 Unspecified atrial flutter: Secondary | ICD-10-CM | POA: Diagnosis not present

## 2017-01-31 DIAGNOSIS — I639 Cerebral infarction, unspecified: Secondary | ICD-10-CM | POA: Diagnosis not present

## 2017-01-31 LAB — VAS US CAROTID
LCCAPDIAS: 15 cm/s
LEFT ECA DIAS: -1 cm/s
LEFT VERTEBRAL DIAS: -15 cm/s
LICADDIAS: -40 cm/s
LICAPDIAS: -30 cm/s
LICAPSYS: -68 cm/s
Left CCA dist dias: -15 cm/s
Left CCA dist sys: -72 cm/s
Left CCA prox sys: 71 cm/s
Left ICA dist sys: -99 cm/s
RCCAPDIAS: 6 cm/s
RCCAPSYS: 91 cm/s
RIGHT ECA DIAS: -1 cm/s
RIGHT VERTEBRAL DIAS: 5 cm/s
Right cca dist sys: -68 cm/s

## 2017-01-31 LAB — URINALYSIS, ROUTINE W REFLEX MICROSCOPIC
BILIRUBIN URINE: NEGATIVE
HGB URINE DIPSTICK: NEGATIVE
Ketones, ur: 5 mg/dL — AB
Leukocytes, UA: NEGATIVE
NITRITE: NEGATIVE
Specific Gravity, Urine: 1.008 (ref 1.005–1.030)
pH: 8 (ref 5.0–8.0)

## 2017-01-31 LAB — ECHOCARDIOGRAM COMPLETE
CHL CUP DOP CALC LVOT VTI: 17.7 cm
CHL CUP RV SYS PRESS: 28 mmHg
CHL CUP TV REG PEAK VELOCITY: 248 cm/s
FS: 47 % — AB (ref 28–44)
HEIGHTINCHES: 70 in
IVS/LV PW RATIO, ED: 0.94
LA diam index: 1.65 cm/m2
LA vol A4C: 40.6 ml
LA vol index: 20.9 mL/m2
LASIZE: 34 mm
LAVOL: 43 mL
LEFT ATRIUM END SYS DIAM: 34 mm
LV PW d: 13.3 mm — AB (ref 0.6–1.1)
LV TDI E'MEDIAL: 5.98
LVELAT: 3.92 cm/s
LVOT area: 3.14 cm2
LVOT peak vel: 103 cm/s
LVOTD: 20 mm
LVOTSV: 56 mL
Lateral S' vel: 13.8 cm/s
RV TAPSE: 25.3 mm
TDI e' lateral: 3.92
TRMAXVEL: 248 cm/s
WEIGHTICAEL: 2991.2 [oz_av]

## 2017-01-31 LAB — BASIC METABOLIC PANEL
Anion gap: 12 (ref 5–15)
BUN: 37 mg/dL — AB (ref 6–20)
CALCIUM: 8.2 mg/dL — AB (ref 8.9–10.3)
CHLORIDE: 96 mmol/L — AB (ref 101–111)
CO2: 26 mmol/L (ref 22–32)
CREATININE: 4.82 mg/dL — AB (ref 0.61–1.24)
GFR calc Af Amer: 12 mL/min — ABNORMAL LOW (ref >60)
GFR calc non Af Amer: 10 mL/min — ABNORMAL LOW (ref >60)
GLUCOSE: 167 mg/dL — AB (ref 65–99)
Potassium: 3.6 mmol/L (ref 3.5–5.1)
Sodium: 134 mmol/L — ABNORMAL LOW (ref 135–145)

## 2017-01-31 LAB — CBC
HEMATOCRIT: 30.3 % — AB (ref 39.0–52.0)
HEMOGLOBIN: 10.1 g/dL — AB (ref 13.0–17.0)
MCH: 30.4 pg (ref 26.0–34.0)
MCHC: 33.3 g/dL (ref 30.0–36.0)
MCV: 91.3 fL (ref 78.0–100.0)
Platelets: 167 10*3/uL (ref 150–400)
RBC: 3.32 MIL/uL — ABNORMAL LOW (ref 4.22–5.81)
RDW: 14.1 % (ref 11.5–15.5)
WBC: 11.5 10*3/uL — ABNORMAL HIGH (ref 4.0–10.5)

## 2017-01-31 LAB — GLUCOSE, CAPILLARY
GLUCOSE-CAPILLARY: 156 mg/dL — AB (ref 65–99)
GLUCOSE-CAPILLARY: 257 mg/dL — AB (ref 65–99)
Glucose-Capillary: 142 mg/dL — ABNORMAL HIGH (ref 65–99)
Glucose-Capillary: 158 mg/dL — ABNORMAL HIGH (ref 65–99)
Glucose-Capillary: 137 mg/dL — ABNORMAL HIGH (ref 65–99)

## 2017-01-31 LAB — HEMOGLOBIN A1C
HEMOGLOBIN A1C: 7 % — AB (ref 4.8–5.6)
MEAN PLASMA GLUCOSE: 154 mg/dL

## 2017-01-31 LAB — HEPATITIS B SURFACE ANTIGEN: Hepatitis B Surface Ag: NEGATIVE

## 2017-01-31 MED ORDER — WARFARIN SODIUM 5 MG PO TABS
5.0000 mg | ORAL_TABLET | Freq: Once | ORAL | Status: DC
  Filled 2017-01-31: qty 1

## 2017-01-31 MED ORDER — ONDANSETRON HCL 4 MG/2ML IJ SOLN: 4.0000 mg | Freq: Four times a day (QID) | INTRAMUSCULAR | Status: DC | PRN

## 2017-01-31 MED ORDER — RENA-VITE PO TABS
1.0000 | ORAL_TABLET | Freq: Every day | ORAL | Status: DC
  Administered 2017-01-31 – 2017-02-05 (×6): 1 via ORAL
  Filled 2017-01-31 (×7): qty 1

## 2017-01-31 MED ORDER — WARFARIN SODIUM 5 MG PO TABS
5.0000 mg | ORAL_TABLET | Freq: Once | ORAL | Status: AC
  Administered 2017-01-31: 5 mg via ORAL

## 2017-01-31 MED ORDER — PATIENT'S GUIDE TO USING COUMADIN BOOK
Freq: Once | Status: AC
  Administered 2017-01-31: 18:00:00
  Filled 2017-01-31: qty 1

## 2017-01-31 MED ORDER — WARFARIN - PHARMACIST DOSING INPATIENT: Freq: Every day | Status: DC

## 2017-01-31 NOTE — Progress Notes (Signed)
Pt's urine bloody and has some small clots around penis; pt clean, up foley care done and MD notified. MD ordered to flush catheter and monitor pt. MD also notified of pt's BP and said to monitor. Charge RN [place] notified and reported off to her. Delia Heady RN

## 2017-01-31 NOTE — Progress Notes (Signed)
Physical Therapy Treatment Patient Details Name: James David MRN: [id number] DOB: 12/12/1935 Today's Date: 01/31/2017    History of Present Illness Pt is 81 y/o male admitted secondary to suspected TIA. Pt with dysarthria, facial droop , syncopal episode and weakness. PMH includes a fib, CKD on dialysis, HTN, DM with neuropathy, and s/p pacemaker placement and AV fistula placement.     PT Comments    Pt seen for mobility progression. Pt demonstrating good progress since previous session, ambulating 33' x2 with RW and min A for stability and safety. PT continuing to recommend CIR for intensive therapy services prior to returning home as he has excellent potential to make great progress. Pt would continue to benefit from skilled physical therapy services at this time while admitted and after d/c to address the below listed limitations in order to improve overall safety and independence with functional mobility.    Follow Up Recommendations  CIR;Supervision/Assistance - 24 hour     Equipment Recommendations  None recommended by PT    Recommendations for Other Services       Precautions / Restrictions Precautions Precautions: Fall Restrictions Weight Bearing Restrictions: No    Mobility  Bed Mobility Overal bed mobility: Needs Assistance Bed Mobility: Supine to Sit     Supine to sit: Min assist;HOB elevated     General bed mobility comments: increased time, use of bed rails, min A to elevate trunk and stability with achieving sitting EOB  Transfers Overall transfer level: Needs assistance Equipment used: Rolling walker (2 wheeled) Transfers: Sit to/from Stand Sit to Stand: Min assist;+2 safety/equipment         General transfer comment: increased time, good technique, min A for stability and safety with transition into standing from bed  Ambulation/Gait Ambulation/Gait assistance: Min assist Ambulation Distance (Feet): 50 Feet (50' x2 with standing rest  break) Assistive device: Rolling walker (2 wheeled) Gait Pattern/deviations: Step-through pattern;Decreased step length - right;Decreased step length - left;Decreased stride length;Shuffle;Trunk flexed Gait velocity: Decreased Gait velocity interpretation: Below normal speed for age/gender General Gait Details: pt with modest instability but no overt LOB; however, requiring min A for stability and safety throughout. VC'ing for longer steps bilaterally; however, pt with minimal adjustment   Stairs            Wheelchair Mobility    Modified Rankin (Stroke Patients Only)       Balance                                            Cognition Arousal/Alertness: Awake/alert Behavior During Therapy: WFL for tasks assessed/performed Overall Cognitive Status: Within Functional Limits for tasks assessed                                        Exercises      General Comments        Pertinent Vitals/Pain Pain Assessment: No/denies pain    Home Living                      Prior Function            PT Goals (current goals can now be found in the care plan section) Acute Rehab PT Goals PT Goal Formulation: With patient Time For Goal Achievement: 02/13/17 Potential  to Achieve Goals: Good Progress towards PT goals: Progressing toward goals    Frequency    Min 3X/week      PT Plan Current plan remains appropriate    Co-evaluation PT/OT/SLP Co-Evaluation/Treatment: Yes Reason for Co-Treatment: For patient/therapist safety;To address functional/ADL transfers PT goals addressed during session: Mobility/safety with mobility;Balance;Proper use of DME;Strengthening/ROM        AM-PAC PT "6 Clicks" Daily Activity  Outcome Measure  Difficulty turning over in bed (including adjusting bedclothes, sheets and blankets)?: A Little Difficulty moving from lying on back to sitting on the side of the bed? : Total Difficulty sitting down  on and standing up from a chair with arms (e.g., wheelchair, bedside commode, etc,.)?: Total Help needed moving to and from a bed to chair (including a wheelchair)?: A Little Help needed walking in hospital room?: A Little Help needed climbing 3-5 steps with a railing? : A Lot 6 Click Score: 13    End of Session Equipment Utilized During Treatment: Gait belt Activity Tolerance: Patient tolerated treatment well Patient left: in chair;with call bell/phone within reach;with chair alarm set;with family/visitor present Nurse Communication: Mobility status PT Visit Diagnosis: Other abnormalities of gait and mobility (R26.89);Ataxic gait (R26.0);Unsteadiness on feet (R26.81)     Time: [id number] PT Time Calculation (min) (ACUTE ONLY): 28 min  Charges:  $Gait Training: 8-22 mins                    G Codes:       [place], [place], [place] 01/31/2017, 3:17 PM

## 2017-01-31 NOTE — Progress Notes (Signed)
Subjective:  tolerated HD Yesterday, troubles this am with dysuria ,bladder scan with Urine retention  Foley cath placed with 600 cc out now [facility] BPH and he reports no prior problems or Urine retention .  Objective Vital signs in last 24 hours: Vitals:   01/31/17 0049 01/31/17 0554 01/31/17 1115 01/31/17 1143  BP: (!) 150/63 (!) 146/67 (!) 180/82 (!) 143/81  Pulse: 90 89 78   Resp: 20 20 20    Temp: 97.8 F (36.6 C) 98.1 F (36.7 C) 98.4 F (36.9 C)   TempSrc: Oral Oral Oral   SpO2: 100% 91% 98%   Weight:      Height:       Weight change: -1.472 kg (-3 lb 3.9 oz)  Physical Exam: General:  elderly WM NAD  Well developed, well nourished, speech normal now   Lungs: Clear bilaterally to auscultation without wheezes, rales, or rhonchi. Breathing is unlabored. Heart: Rare irreg beat ; no murmur. Ventric rate 70s' Abdomen:  Obese, Soft, non-tender, non-distended.. Lower extremities: No  Pedal edema or ischemic changes Dialysis Access: RUE AVF + extensive bruising present, but good bruit.  CXR - no acute changes    op Dialysis Orders:  TTS at [facility] HD time increasing with each HD (3:30 ordered), BFR 300, DFR 800, EDW 85.5kg, 2K/2.25Ca,  R BCFAVF, No heparin. - Hectoral 47mcg IV q HD - Venofer 50mg  IV weekly  Problem/Plan: 1.  Slurred speech/ L sided weakness - questionTIA v. acute CVA:  RX Per neuro./ Rehab team seeing  2.  ESRD: (recent Start OP HD )3rd HD tx yesterday ever- 3.5 again tomor. then ^ time to 4hr  On  TTS schedule,  No heparin.  3. SP  Rt BCF- successful declot/PTA Dr. Posey Pronto 01/28/17 as op (initally placed 01/2014 )- ecchymosis slowly improving  4.  Hypertension/volume: Allowing permissive HTN per neuro. Low goal UF on hd / sl below edw (edw to be determined as just started HD) /CXR NAD on Admit and no pedal edema/ 5.  Anemia of ESRD Hgb 9.8>10.1. Monitor .not on esa yet, / start low dose if drops again / on weekly fe on hd  6. HO A . Flutter/ HO pacemaker  Coumadin to be started 7.  Metabolic bone disease: Ca ok. Continue Phoslo as binder. Start VDRA. 8. HO BPH - on Finasteride / foley cath placed yest .  9. Nutrition:  Alb low. Start protein supps 10. DM type 2 per admit   Ernest Haber, PA-C [name] [place] [phone] 01/31/2017,4:06 PM  LOS: 0 days   Pt seen, examined and agree w A/P as above.  Kelly Splinter MD [facility] pager [phone]   01/31/2017, 5:05 PM    Labs: Basic Metabolic Panel:  Recent Labs Lab 01/29/17 1139 01/31/17 0352  NA 134* 134*  K 4.4 3.6  CL 101 96*  CO2 21* 26  GLUCOSE 234* 167*  BUN 72* 37*  CREATININE 6.82* 4.82*  CALCIUM 8.6* 8.2*   Liver Function Tests:  Recent Labs Lab 01/29/17 1139  AST 13*  ALT 15*  ALKPHOS 65  BILITOT 0.5  PROT 5.5*  ALBUMIN 2.9*   No results for input(s): LIPASE, AMYLASE in the last 168 hours. No results for input(s): AMMONIA in the last 168 hours. CBC:  Recent Labs Lab 01/29/17 1139 01/31/17 0352  WBC 11.0* 11.5*  NEUTROABS 9.2*  --   HGB 9.8* 10.1*  HCT 30.0* 30.3*  MCV 91.7 91.3  PLT 174 167  Cardiac Enzymes: No results for input(s): CKTOTAL, CKMB, CKMBINDEX, TROPONINI in the last 168 hours. CBG:  Recent Labs Lab 01/30/17 1142 01/30/17 1614 01/31/17 0124 01/31/17 0643 01/31/17 1111  GLUCAP 154* 115* 142* 158* 156*    Studies/Results: Ct Head Wo Contrast  Result Date: 01/30/2017 CLINICAL DATA:  81 year old male with possible stroke. Resorption rest that resolved left upper extremity numbness and tingling. Dizziness and slurred speech. EXAM: CT HEAD WITHOUT CONTRAST TECHNIQUE: Contiguous axial images were obtained from the base of the skull through the vertex without intravenous contrast. COMPARISON:  Head CT without contrast 01/29/2017. FINDINGS: Brain: No acute intracranial hemorrhage identified. No midline shift, mass effect, or evidence of intracranial mass lesion. Stable ventricle size and  configuration. Stable gray-white matter differentiation throughout the brain. Patchy white matter hypodensity and chronic appearing left caudate lacunar infarct. Small chronic appearing right superior cerebellar infarcts. No acute or evolving infarct is identified. Vascular: Calcified atherosclerosis at the skull base. No suspicious intracranial vascular hyperdensity. Skull: Negative.  No acute osseous abnormality identified. Sinuses/Orbits: Visualized paranasal sinuses and mastoids are stable and well pneumatized. Other: Stable and negative orbit and scalp soft tissues. IMPRESSION: Stable non contrast CT appearance of the brain. Evidence of chronic small vessel disease but no acute or evolving cerebral infarct is identified by CT. Electronically Signed   By: Genevie Ann M.D.   On: 01/30/2017 12:54   Medications:  . calcium acetate  1,334 mg Oral TID WC  . clopidogrel  75 mg Oral Daily  . doxercalciferol  4 mcg Intravenous Q T,Th,Sa-HD  . ezetimibe  10 mg Oral Daily  . feeding supplement (PRO-STAT SUGAR FREE 64)  30 mL Oral BID  . fenofibrate  160 mg Oral Daily  . finasteride  5 mg Oral Daily  . heparin  5,000 Units Subcutaneous Q8H  . insulin aspart  0-5 Units Subcutaneous QHS  . insulin aspart  0-9 Units Subcutaneous TID WC  . ipratropium  2 spray Each Nare Daily

## 2017-01-31 NOTE — Progress Notes (Signed)
VASCULAR LAB PRELIMINARY  PRELIMINARY  PRELIMINARY  PRELIMINARY  Carotid duplex completed.    Preliminary report:  Bilateral:  1-39% ICA stenosis.  Vertebral artery flow is antegrade.     Arleigh Odowd, RVS 01/31/2017, 9:34 AM

## 2017-01-31 NOTE — Progress Notes (Addendum)
ANTICOAGULATION CONSULT NOTE - Initial Consult  Pharmacy Consult for warfarin Indication: atrial fibrillation  Allergies  Allergen Reactions  . Amlodipine Besylate Swelling    angioedema  . Lisinopril Other (See Comments)    Renal insufficiency  . Statins Other (See Comments)    Muscle aches    Patient Measurements: Height: 5\' 10"  (177.8 cm) Weight: 186 lb 15.2 oz (84.8 kg) IBW/kg (Calculated) : 73   Vital Signs: Temp: 98.4 F (36.9 C) (05/18 1115) Temp Source: Oral (05/18 1115) BP: 143/81 (05/18 1143) Pulse Rate: 78 (05/18 1115)  Labs:  Recent Labs  01/29/17 1139 01/31/17 0352  HGB 9.8* 10.1*  HCT 30.0* 30.3*  PLT 174 167  APTT 31  --   LABPROT 13.4  --   INR 1.02  --   CREATININE 6.82* 4.82*    Estimated Creatinine Clearance: 13 mL/min (A) (by C-G formula based on SCr of 4.82 mg/dL (H)).   Medical History: Past Medical History:  Diagnosis Date  . Atrial fibrillation ([place])   . Chronic kidney disease, stage IV (severe) (HCC)    baseline creatinine 2-3  . DDD (degenerative disc disease)   . Diabetes mellitus without complication ([place])   . Hyperlipidemia   . Hypertension   . Melanoma ([place]) 2010   removed at [facility]  . Second degree heart block    a. s/p STJ dual chamber PPM - Dr [name]    Assessment: Pt with history of Aflutter. Has not been on anticoagulation due to pt being low risk for stroke. Now with acute stroke/TIA, patient will be started on anticoagulation. Pt is ESRD on HD, making warfarin the best option in this patient. Baseline INR 1.   Goal of Therapy:  INR 2-3 Monitor platelets by anticoagulation protocol: Yes    Plan:  -Warfarin 5 mg po x1 -Daily INR   [name] 01/31/2017,5:02 PM

## 2017-01-31 NOTE — Consult Note (Signed)
Physical Medicine and Rehabilitation Consult Reason for Consult: Dysarthria/ facial droop with gait disorder Referring Physician: [facility]   HPI: James David is a 81 y.o. right handed male with history of diabetes mellitus, atrial fibrillation/second-degree heart block with pacemaker maintained on aspirin, end-stage renal disease with dialysis recently initiated, hypertension. Per chart review patient lives with spouse independent prior to admission. One level townhome. Presented 01/29/2017 with dizziness, facial droop, slurred speech and left-sided weakness. Cranial CT scan showed evidence of chronic small vessel disease but no acute or evolving abnormalities. Patient did not receive TPA. Echocardiogram pending. Carotid Doppler but no ICA stenosis. Neurology consulted suspect acute ischemic infarct and currently maintained on Plavix for CVA prophylaxis. Subcutaneous heparin for DVT prophylaxis. Patient continues on dialysis as directed per renal services. Physical and occupational therapy evaluations completed with recommendations of physical medicine rehabilitation consult.   Review of Systems  Constitutional: Negative for chills and fever.  HENT: Negative for hearing loss.   Eyes: Negative for blurred vision and double vision.  Respiratory: Negative for cough and shortness of breath.   Cardiovascular: Positive for palpitations and leg swelling. Negative for chest pain.  Gastrointestinal: Positive for constipation. Negative for nausea and vomiting.  Genitourinary: Positive for urgency. Negative for dysuria and hematuria.  Musculoskeletal: Positive for myalgias.  Skin: Negative for rash.  Neurological: Positive for sensory change, speech change and weakness. Negative for seizures.  All other systems reviewed and are negative.  Past Medical History:  Diagnosis Date  . Atrial fibrillation ([place])   . Chronic kidney disease, stage IV (severe) (HCC)    baseline creatinine 2-3  . DDD  (degenerative disc disease)   . Diabetes mellitus without complication ([place])   . Hyperlipidemia   . Hypertension   . Melanoma ([place]) 2010   removed at [facility]  . Second degree heart block    a. s/p STJ dual chamber PPM - Dr [name]   Past Surgical History:  Procedure Laterality Date  . AV FISTULA PLACEMENT Right 05/31/2014   Procedure: Right Arm Brachiocephalic ARTERIOVENOUS FISTULA CREATION  ;  Surgeon: Conrad Evanston, MD;  Location: [facility];  Service: Vascular;  Laterality: Right;  . CATARACT EXTRACTION W/ INTRAOCULAR LENS  IMPLANT, BILATERAL    . COLONOSCOPY    . EXCISION MELANOMA WITH SENTINEL LYMPH NODE BIOPSY Right 02/08/2016   Procedure: WIDE EXCISION RIGHT SHOULDER MELANOMA WITH RIGHT SENTINEL LYMPH NODE BIOPSY;  Surgeon: [name], MD;  Location: [facility];  Service: General;  Laterality: Right;  . LAMINECTOMY    . PERMANENT PACEMAKER INSERTION N/A 01/20/2014   STJ Assurity dual chamber pacemaker implanted by Dr [name] for 2nd degree AV block   Family History  Problem Relation Age of Onset  . Pulmonary embolism Mother   . Diabetes Mother   . Heart attack Father   . Diabetes Brother    Social History:  reports that he has never smoked. He has never used smokeless tobacco. He reports that he does not drink alcohol or use drugs. Allergies:  Allergies  Allergen Reactions  . Amlodipine Besylate Swelling    angioedema  . Lisinopril Other (See Comments)    Renal insufficiency  . Statins Other (See Comments)    Muscle aches   Medications Prior to Admission  Medication Sig Dispense Refill  . ACCU-CHEK AVIVA PLUS test strip     . acetaminophen (TYLENOL) 500 MG tablet Take 500 mg by mouth daily as needed for mild pain. (pain/headaches)    .  aspirin EC 81 MG tablet Take 81 mg by mouth at bedtime.    . calcium acetate (PHOSLO) 667 MG capsule Take 2 capsules by mouth 3 (three) times daily.    . carvedilol (COREG) 25 MG tablet Take 1 tablet (25 mg total) by mouth 2 (two) times daily with a  meal. 60 tablet 6  . ezetimibe (ZETIA) 10 MG tablet Take 10 mg by mouth daily.    . fenofibrate 160 MG tablet Take 160 mg by mouth daily.    . finasteride (PROSCAR) 5 MG tablet Take 5 mg by mouth daily.    . furosemide (LASIX) 80 MG tablet Take 40 mg by mouth daily.     Marland Kitchen glimepiride (AMARYL) 4 MG tablet Take 4 mg by mouth 2 (two) times daily.     . Hypromellose (ARTIFICIAL TEARS OP) Place 1 drop into both eyes daily as needed (supplement).    Marland Kitchen ipratropium (ATROVENT) 0.03 % nasal spray Place 2 sprays into both nostrils daily.     . tamsulosin (FLOMAX) 0.4 MG CAPS capsule Take 0.4 mg by mouth 3 (three) times a week. Monday, Wednesday and Friday (at bedtime)      Home: Home Living Family/patient expects to be discharged to:: Private residence Living Arrangements: Spouse/significant other Available Help at Discharge: Family, Available 24 hours/day Type of Home: Other(Comment) (townhome) Home Access: Level entry Home Layout: One level Bathroom Shower/Tub: Multimedia programmer: Handicapped height Bathroom Accessibility: Yes Home Equipment: Grab bars - tub/shower  Functional History: Prior Function Level of Independence: Independent Comments: Reports he was not using AD  Functional Status:  Mobility: Bed Mobility Overal bed mobility: Needs Assistance Bed Mobility: Supine to Sit, Sit to Supine Supine to sit: Mod assist Sit to supine: Mod assist General bed mobility comments: Mod assist for elevating trunk during supine<>sti and managing B LEs during sit<>supine. Transfers Overall transfer level: Needs assistance Equipment used: Rolling walker (2 wheeled) Transfers: Sit to/from Stand Sit to Stand: Mod assist General transfer comment: Able to complete sit<>stand during ADL with heavy mod lifting assist. Significant posterior and L lateral lean with pt demonstrating difficulty moving L UE from bed to RW. Feel this may be a combination of coordination deficits and fear of  falling. Ambulation/Gait Ambulation/Gait assistance: Mod assist Ambulation Distance (Feet): 1 Feet Assistive device: Rolling walker (2 wheeled) Gait Pattern/deviations: Step-to pattern, Decreased stride length, Ataxic, Trunk flexed, Wide base of support General Gait Details: Attempted side stepping along bed with RW. Required mod A secondary to decreased balance and posterior/L lateral lean. Verbal cues for midline orientation and sequencing using RW.  Gait velocity: Decreased Gait velocity interpretation: Below normal speed for age/gender    ADL: ADL Overall ADL's : Needs assistance/impaired Eating/Feeding: Sitting, Supervision/ safety Grooming: Minimal assistance, Sitting Upper Body Bathing: Min guard, Sitting Lower Body Bathing: Moderate assistance, Sit to/from stand Upper Body Dressing : Min guard, Sitting Lower Body Dressing: Moderate assistance, Sit to/from stand Toilet Transfer: Moderate assistance, RW Toilet Transfer Details (indicate cue type and reason): Simualted with sit<>stand followed by 2 steps ambulation. Additionally able to complete side steps toward head of bed. Toileting- Clothing Manipulation and Hygiene: Moderate assistance, Sit to/from stand Functional mobility during ADLs: Moderate assistance General ADL Comments: Limited functional mobility due to instability. Pt falling to the L in standing and with poor dynamic sitting balance during ADL.  Cognition: Cognition Overall Cognitive Status: Within Functional Limits for tasks assessed Orientation Level: Oriented X4 Cognition Arousal/Alertness: Awake/alert Behavior During Therapy: WFL for tasks  assessed/performed Overall Cognitive Status: Within Functional Limits for tasks assessed  Blood pressure (!) 146/67, pulse 89, temperature 98.1 F (36.7 C), temperature source Oral, resp. rate 20, height 5\' 10"  (1.778 m), weight 84.8 kg (186 lb 15.2 oz), SpO2 91 %. Physical Exam  Vitals reviewed. Constitutional: He  appears well-developed.  HENT:  Facial droop  Eyes:  Pupils reactive to light  Neck: Normal range of motion. Neck supple. No thyromegaly present.  Cardiovascular:  Cardiac rate control,  Respiratory: Effort normal and breath sounds normal. No respiratory distress.  GI: Soft. Bowel sounds are normal. He exhibits no distension.  Neurological: He is alert.  Dysarthric speech. Follows commands. He was able to state his name.cognitively appears appropriate. RUE and LUE grossly 5/5. Mild left pronator drift and decreased [place] on left with FTN. RLE and LLE 5/5 with mild impairment in HTS on left. Intact light touch and pain.   Skin: Skin is warm and dry.  Psychiatric: He has a normal mood and affect. His behavior is normal.    Results for orders placed or performed during the hospital encounter of 01/29/17 (from the past 24 hour(s))  Glucose, capillary     Status: Abnormal   Collection Time: 01/30/17 11:42 AM  Result Value Ref Range   Glucose-Capillary 154 (H) 65 - 99 mg/dL  Glucose, capillary     Status: Abnormal   Collection Time: 01/30/17  4:14 PM  Result Value Ref Range   Glucose-Capillary 115 (H) 65 - 99 mg/dL  Hepatitis B surface antigen     Status: None   Collection Time: 01/30/17  7:17 PM  Result Value Ref Range   Hepatitis B Surface Ag Negative Negative  Glucose, capillary     Status: Abnormal   Collection Time: 01/31/17  1:24 AM  Result Value Ref Range   Glucose-Capillary 142 (H) 65 - 99 mg/dL  CBC     Status: Abnormal   Collection Time: 01/31/17  3:52 AM  Result Value Ref Range   WBC 11.5 (H) 4.0 - 10.5 K/uL   RBC 3.32 (L) 4.22 - 5.81 MIL/uL   Hemoglobin 10.1 (L) 13.0 - 17.0 g/dL   HCT 30.3 (L) 39.0 - 52.0 %   MCV 91.3 78.0 - 100.0 fL   MCH 30.4 26.0 - 34.0 pg   MCHC 33.3 30.0 - 36.0 g/dL   RDW 14.1 11.5 - 15.5 %   Platelets 167 150 - 400 K/uL  Basic metabolic panel     Status: Abnormal   Collection Time: 01/31/17  3:52 AM  Result Value Ref Range   Sodium 134 (L)  135 - 145 mmol/L   Potassium 3.6 3.5 - 5.1 mmol/L   Chloride 96 (L) 101 - 111 mmol/L   CO2 26 22 - 32 mmol/L   Glucose, Bld 167 (H) 65 - 99 mg/dL   BUN 37 (H) 6 - 20 mg/dL   Creatinine, Ser 4.82 (H) 0.61 - 1.24 mg/dL   Calcium 8.2 (L) 8.9 - 10.3 mg/dL   GFR calc non Af Amer 10 (L) >60 mL/min   GFR calc Af Amer 12 (L) >60 mL/min   Anion gap 12 5 - 15  Glucose, capillary     Status: Abnormal   Collection Time: 01/31/17  6:43 AM  Result Value Ref Range   Glucose-Capillary 158 (H) 65 - 99 mg/dL   Comment 1 Notify RN    Comment 2 Document in Chart    Dg Chest 2 View  Result Date: 01/29/2017 CLINICAL DATA:  Dizziness EXAM: CHEST  2 VIEW COMPARISON:  01/21/2014 FINDINGS: Pacing device is again seen and stable. Cardiac shadow is stable. Vascular stent is noted in the subclavian region on the right. The lungs are clear bilaterally. Postsurgical changes in the left axilla are noted. IMPRESSION: No acute abnormality noted. Electronically Signed   By: Inez Catalina M.D.   On: 01/29/2017 12:13   Ct Head Wo Contrast  Result Date: 01/30/2017 CLINICAL DATA:  81 year old male with possible stroke. Resorption rest that resolved left upper extremity numbness and tingling. Dizziness and slurred speech. EXAM: CT HEAD WITHOUT CONTRAST TECHNIQUE: Contiguous axial images were obtained from the base of the skull through the vertex without intravenous contrast. COMPARISON:  Head CT without contrast 01/29/2017. FINDINGS: Brain: No acute intracranial hemorrhage identified. No midline shift, mass effect, or evidence of intracranial mass lesion. Stable ventricle size and configuration. Stable gray-white matter differentiation throughout the brain. Patchy white matter hypodensity and chronic appearing left caudate lacunar infarct. Small chronic appearing right superior cerebellar infarcts. No acute or evolving infarct is identified. Vascular: Calcified atherosclerosis at the skull base. No suspicious intracranial vascular  hyperdensity. Skull: Negative.  No acute osseous abnormality identified. Sinuses/Orbits: Visualized paranasal sinuses and mastoids are stable and well pneumatized. Other: Stable and negative orbit and scalp soft tissues. IMPRESSION: Stable non contrast CT appearance of the brain. Evidence of chronic small vessel disease but no acute or evolving cerebral infarct is identified by CT. Electronically Signed   By: Genevie Ann M.D.   On: 01/30/2017 12:54   Ct Head Wo Contrast  Result Date: 01/29/2017 CLINICAL DATA:  Dizziness and slurred speech. Chronic renal failure. History of melanoma EXAM: CT HEAD WITHOUT CONTRAST TECHNIQUE: Contiguous axial images were obtained from the base of the skull through the vertex without intravenous contrast. COMPARISON:  None. FINDINGS: Brain: There is mild diffuse atrophy. Left lateral ventricle is slightly larger than right lateral ventricle, a probable anatomic variant. There is no intracranial mass, hemorrhage, extra-axial fluid collection, or midline shift. There is patchy small vessel disease in the centra semiovale bilaterally. Elsewhere gray-white compartments appear normal. There is no evident acute appearing infarct. Vascular: There is no demonstrable hyperdense vessel. There is calcification in each carotid siphon region. There is also calcification in each distal vertebral artery. Skull: Bony calvarium appears intact. Sinuses/Orbits: There is mild mucosal thickening in the medial right maxillary antrum. There is mucosal thickening in several ethmoid air cells bilaterally. Other visualized paranasal sinuses are clear. Orbits appear symmetric bilaterally. Other: Mastoid air cells are clear. There is debris in each external auditory canal. IMPRESSION: Mild atrophy with patchy periventricular small vessel disease. No intracranial mass, hemorrhage, or extra-axial fluid collection. No acute appearing infarct evident. There are foci of arteriovascular calcification bilaterally. There  are areas of mild paranasal sinus disease. There is probable cerumen in each external auditory canal. Electronically Signed   By: Lowella Grip III M.D.   On: 01/29/2017 14:39    Assessment/Plan: Diagnosis: Right ?subcortical infarct with mild left hemiapresis 1. Does the need for close, 24 hr/day medical supervision in concert with the patient's rehab needs make it unreasonable for this patient to be served in a less intensive setting? Potentially 2. Co-Morbidities requiring supervision/potential complications: ESRD on HD, HTN, DM, chronic dCHF.  3. Due to bladder management, bowel management, safety, skin/wound care, disease management, medication administration, pain management and patient education, does the patient require 24 hr/day rehab nursing? Potentially 4. Does the patient require coordinated care of a physician, rehab  nurse, PT (1-2 hrs/day, 5 days/week) and OT (1-2 hrs/day, 5 days/week), +/- SLP to address physical and functional deficits in the context of the above medical diagnosis(es)? Potentially Addressing deficits in the following areas: balance, endurance, locomotion, strength, transferring, bowel/bladder control, bathing, dressing, feeding, grooming, toileting and psychosocial support 5. Can the patient actively participate in an intensive therapy program of at least 3 hrs of therapy per day at least 5 days per week? Yes 6. The potential for patient to make measurable gains while on inpatient rehab is good and fair 7. Anticipated functional outcomes upon discharge from inpatient rehab are modified independent  with PT, modified independent with OT, modified independent with SLP. 8. Estimated rehab length of stay to reach the above functional goals is: 5-7 days potentially.  9. Anticipated D/C setting: Home 10. Anticipated post D/C treatments: HH therapy and Outpatient therapy 11. Overall Rehab/Functional Prognosis: excellent  RECOMMENDATIONS: This patient's condition is  appropriate for continued rehabilitative care in the following setting: see below Patient has agreed to participate in recommended program. Yes Note that insurance prior authorization may be required for reimbursement for recommended care.  Comment: Pt is demonstrating neurological progress. Would like to see performance with PT/OT today. Might be able to go [facility] with [facility] vs outpatient therapies.   Rehab Admissions Coordinator to follow up.  Thanks,  Meredith Staggers, MD, Mellody Drown    Cathlyn Parsons., PA-C 01/31/2017

## 2017-01-31 NOTE — Progress Notes (Signed)
Occupational Therapy Treatment Patient Details Name: James David MRN: [id number] DOB: [birth year] Today's Date: 01/31/2017    History of present illness Pt is 81 y/o male admitted secondary to suspected TIA. Pt with dysarthria, facial droop , syncopal episode and weakness. PMH includes a fib, CKD on dialysis, HTN, DM with neuropathy, and s/p pacemaker placement and AV fistula placement.    OT comments  Pt making good progress towards OT goals this session. Pt demonstrated increased bed mobility, seated balance and ability to perform seated ADL, increased ability to perform functional transfers. Pt remains an excellent candidate for CIR as he will be able to tolerate intense therapy and to maximize safety and independence in ADL and functional transfers as Pt was independent PTA. OT will continue to follow in acute setting to address OT problem list below.   Follow Up Recommendations  CIR;Supervision/Assistance - 24 hour    Equipment Recommendations  Other (comment) (defer to next venue)    Recommendations for Other Services      Precautions / Restrictions Precautions Precautions: Fall Restrictions Weight Bearing Restrictions: No       Mobility Bed Mobility Overal bed mobility: Needs Assistance Bed Mobility: Supine to Sit     Supine to sit: Min assist;HOB elevated     General bed mobility comments: increased time, use of bed rails, min A to elevate trunk and stability with achieving sitting EOB  Transfers Overall transfer level: Needs assistance Equipment used: Rolling walker (2 wheeled) Transfers: Sit to/from Stand Sit to Stand: Min assist;+2 safety/equipment         General transfer comment: increased time, good technique, min A for stability and safety with transition into standing from bed    Balance Overall balance assessment: Needs assistance Sitting-balance support: Bilateral upper extremity supported;Feet supported Sitting balance-Leahy Scale:  Poor Sitting balance - Comments: Pt requires min A to maintain midline sitting EOB Postural control: Posterior lean;Left lateral lean Standing balance support: Bilateral upper extremity supported;During functional activity Standing balance-Leahy Scale: Poor Standing balance comment: relies on BUE support on RW and vc to correct direction                           ADL either performed or assessed with clinical judgement   ADL Overall ADL's : Needs assistance/impaired     Grooming: Sitting;Wash/dry hands;Wash/dry face;Min guard Grooming Details (indicate cue type and reason): able to perform sitting EOB                 Toilet Transfer: Minimal assistance;Cueing for sequencing;Ambulation;Comfort height toilet;RW Armed forces technical officer Details (indicate cue type and reason): simulated with transfer to recliner as Pt currently has foley.         Functional mobility during ADLs: Minimal assistance;Rolling walker General ADL Comments: dynamic sitting balance improved since initial evaluation - Pt continues to demonstrate left lean during seating ADL and is unaware, unable to self-correct but with physical cues can come to midline. Good functional use of LUE during ADL to open deoderant containers and turn pages in newspaper.      Vision       Perception     Praxis      Cognition Arousal/Alertness: Awake/alert Behavior During Therapy: WFL for tasks assessed/performed Overall Cognitive Status: Within Functional Limits for tasks assessed  Exercises     Shoulder Instructions       General Comments Pt's daughter present for session    Pertinent Vitals/ Pain       Pain Assessment: No/denies pain  Home Living                                          Prior Functioning/Environment              Frequency  Min 3X/week        Progress Toward Goals  OT Goals(current goals can now be  found in the care plan section)  Progress towards OT goals: Progressing toward goals  Acute Rehab OT Goals Patient Stated Goal: to regain independence OT Goal Formulation: With patient/family Time For Goal Achievement: 02/13/17 Potential to Achieve Goals: Good  Plan Discharge plan remains appropriate;Frequency remains appropriate    Co-evaluation    PT/OT/SLP Co-Evaluation/Treatment: Yes Reason for Co-Treatment: For patient/therapist safety;To address functional/ADL transfers PT goals addressed during session: Mobility/safety with mobility;Balance;Proper use of DME;Strengthening/ROM OT goals addressed during session: ADL's and self-care      AM-PAC PT "6 Clicks" Daily Activity     Outcome Measure   Help from another person eating meals?: None Help from another person taking care of personal grooming?: A Little Help from another person toileting, which includes using toliet, bedpan, or urinal?: A Little Help from another person bathing (including washing, rinsing, drying)?: A Little Help from another person to put on and taking off regular upper body clothing?: A Little Help from another person to put on and taking off regular lower body clothing?: A Lot 6 Click Score: 18    End of Session Equipment Utilized During Treatment: Rolling walker;Gait belt  OT Visit Diagnosis: Other abnormalities of gait and mobility (R26.89);Ataxia, unspecified (R27.0);Unsteadiness on feet (R26.81);Muscle weakness (generalized) (M62.81)   Activity Tolerance Patient tolerated treatment well   Patient Left in chair;with call bell/phone within reach;with chair alarm set;with family/visitor present   Nurse Communication Mobility status        Time: 7998-7215 OT Time Calculation (min): 30 min  Charges: OT General Charges $OT Visit: 1 Procedure OT Treatments $Self Care/Home Management : 8-22 mins  Hulda Humphrey OTR/L [place] 01/31/2017, 6:30 PM

## 2017-01-31 NOTE — Progress Notes (Addendum)
[facility] PROGRESS NOTE  James David YIF:[id number] DOB: Sep 27, 1935 DOA: 01/29/2017  PCP: [name], MD  Brief History/Interval Summary: 81 year old male with a past medical history of atrial flutter, followed by cardiology, but not on anticoagulation due to low burden, history of pacemaker placement, history of chronic kidney disease, who started dialysis recently, diabetes mellitus, hypertension, presented with sudden onset of diaphoresis, dizziness, left arm weakness and slurred speech. Concern was for TIA versus stroke. Patient was hospitalized for further management.  Reason for Visit: Acute stroke  Consultants: Neurology. Nephrology  Procedures:  Transthoracic echocardiogram is pending.   Carotid Doppler Bilateral:  1-39% ICA stenosis.  Vertebral artery flow is antegrade.    Antibiotics: None  Subjective/Interval History: Patient states that his speech has improved this morning. Denies any weakness on any one side of his body. Denies any difficulty swallowing. No nausea, vomiting. Wants to sit up on the chair. Also complains of pain with urination  ROS: No chest pain or shortness of breath  Objective:  Vital Signs  Vitals:   01/30/17 2230 01/30/17 2255 01/31/17 0049 01/31/17 0554  BP: 137/73 128/60 (!) 150/63 (!) 146/67  Pulse: 97 83 90 89  Resp:  (!) 21 20 20   Temp:  98.1 F (36.7 C) 97.8 F (36.6 C) 98.1 F (36.7 C)  TempSrc:  Oral Oral Oral  SpO2:  98% 100% 91%  Weight:  84.8 kg (186 lb 15.2 oz)    Height:        Intake/Output Summary (Last 24 hours) at 01/31/17 0851 Last data filed at 01/31/17 0500  Gross per 24 hour  Intake                0 ml  Output             1203 ml  Net            -1203 ml   Filed Weights   01/29/17 1811 01/30/17 1911 01/30/17 2255  Weight: 87.3 kg (192 lb 6.4 oz) 85.8 kg (189 lb 2.5 oz) 84.8 kg (186 lb 15.2 oz)    General appearance: Awake and alert. No distress Resp: Clear to auscultation bilaterally.  No wheezing, rales or rhonchi Cardio: S1, S2 is normal, regular. No S3, S4. No rubs, murmurs, or bruit GI: Abdomen is soft. Nontender, nondistended. Bowel sounds are present. Extremities: No pedal edema Neurologic: Oriented 3. Speech is much better this morning. Continues to have mild left-sided facial asymmetry. Tongue is midline. Strength is normal in upper and lower extremities bilaterally.   Lab Results:  Data Reviewed: I have personally reviewed following labs and imaging studies  CBC:  Recent Labs Lab 01/29/17 1139 01/31/17 0352  WBC 11.0* 11.5*  NEUTROABS 9.2*  --   HGB 9.8* 10.1*  HCT 30.0* 30.3*  MCV 91.7 91.3  PLT 174 867    Basic Metabolic Panel:  Recent Labs Lab 01/29/17 1139 01/31/17 0352  NA 134* 134*  K 4.4 3.6  CL 101 96*  CO2 21* 26  GLUCOSE 234* 167*  BUN 72* 37*  CREATININE 6.82* 4.82*  CALCIUM 8.6* 8.2*    GFR: Estimated Creatinine Clearance: 13 mL/min (A) (by C-G formula based on SCr of 4.82 mg/dL (H)).  Liver Function Tests:  Recent Labs Lab 01/29/17 1139  AST 13*  ALT 15*  ALKPHOS 65  BILITOT 0.5  PROT 5.5*  ALBUMIN 2.9*     Coagulation Profile:  Recent Labs Lab 01/29/17 1139  INR 1.02  CBG:  Recent Labs Lab 01/30/17 0611 01/30/17 1142 01/30/17 1614 01/31/17 0124 01/31/17 0643  GLUCAP 125* 154* 115* 142* 158*    Lipid Profile:  Recent Labs  01/30/17 0424  CHOL 176  HDL 29*  LDLCALC 99  TRIG 242*  CHOLHDL 6.1    Radiology Studies: Dg Chest 2 View  Result Date: 01/29/2017 CLINICAL DATA:  Dizziness EXAM: CHEST  2 VIEW COMPARISON:  01/21/2014 FINDINGS: Pacing device is again seen and stable. Cardiac shadow is stable. Vascular stent is noted in the subclavian region on the right. The lungs are clear bilaterally. Postsurgical changes in the left axilla are noted. IMPRESSION: No acute abnormality noted. Electronically Signed   By: Inez Catalina M.D.   On: 01/29/2017 12:13   Ct Head Wo Contrast  Result  Date: 01/30/2017 CLINICAL DATA:  81 year old male with possible stroke. Resorption rest that resolved left upper extremity numbness and tingling. Dizziness and slurred speech. EXAM: CT HEAD WITHOUT CONTRAST TECHNIQUE: Contiguous axial images were obtained from the base of the skull through the vertex without intravenous contrast. COMPARISON:  Head CT without contrast 01/29/2017. FINDINGS: Brain: No acute intracranial hemorrhage identified. No midline shift, mass effect, or evidence of intracranial mass lesion. Stable ventricle size and configuration. Stable gray-white matter differentiation throughout the brain. Patchy white matter hypodensity and chronic appearing left caudate lacunar infarct. Small chronic appearing right superior cerebellar infarcts. No acute or evolving infarct is identified. Vascular: Calcified atherosclerosis at the skull base. No suspicious intracranial vascular hyperdensity. Skull: Negative.  No acute osseous abnormality identified. Sinuses/Orbits: Visualized paranasal sinuses and mastoids are stable and well pneumatized. Other: Stable and negative orbit and scalp soft tissues. IMPRESSION: Stable non contrast CT appearance of the brain. Evidence of chronic small vessel disease but no acute or evolving cerebral infarct is identified by CT. Electronically Signed   By: Genevie Ann M.D.   On: 01/30/2017 12:54   Ct Head Wo Contrast  Result Date: 01/29/2017 CLINICAL DATA:  Dizziness and slurred speech. Chronic renal failure. History of melanoma EXAM: CT HEAD WITHOUT CONTRAST TECHNIQUE: Contiguous axial images were obtained from the base of the skull through the vertex without intravenous contrast. COMPARISON:  None. FINDINGS: Brain: There is mild diffuse atrophy. Left lateral ventricle is slightly larger than right lateral ventricle, a probable anatomic variant. There is no intracranial mass, hemorrhage, extra-axial fluid collection, or midline shift. There is patchy small vessel disease in the  centra semiovale bilaterally. Elsewhere gray-white compartments appear normal. There is no evident acute appearing infarct. Vascular: There is no demonstrable hyperdense vessel. There is calcification in each carotid siphon region. There is also calcification in each distal vertebral artery. Skull: Bony calvarium appears intact. Sinuses/Orbits: There is mild mucosal thickening in the medial right maxillary antrum. There is mucosal thickening in several ethmoid air cells bilaterally. Other visualized paranasal sinuses are clear. Orbits appear symmetric bilaterally. Other: Mastoid air cells are clear. There is debris in each external auditory canal. IMPRESSION: Mild atrophy with patchy periventricular small vessel disease. No intracranial mass, hemorrhage, or extra-axial fluid collection. No acute appearing infarct evident. There are foci of arteriovascular calcification bilaterally. There are areas of mild paranasal sinus disease. There is probable cerumen in each external auditory canal. Electronically Signed   By: Lowella Grip III M.D.   On: 01/29/2017 14:39     Medications:  Scheduled: . calcium acetate  1,334 mg Oral TID WC  . clopidogrel  75 mg Oral Daily  . doxercalciferol  4 mcg Intravenous Q  T,Th,Sa-HD  . ezetimibe  10 mg Oral Daily  . feeding supplement (PRO-STAT SUGAR FREE 64)  30 mL Oral BID  . fenofibrate  160 mg Oral Daily  . finasteride  5 mg Oral Daily  . heparin  5,000 Units Subcutaneous Q8H  . insulin aspart  0-5 Units Subcutaneous QHS  . insulin aspart  0-9 Units Subcutaneous TID WC  . ipratropium  2 spray Each Nare Daily   Continuous:  DZH:[id number] **OR** acetaminophen (TYLENOL) oral liquid 160 mg/5 mL **OR** acetaminophen, ondansetron (ZOFRAN) IV, polyvinyl alcohol  Assessment/Plan:  Principal Problem:   TIA (transient ischemic attack) Active Problems:   Hypertension   CKD (chronic kidney disease) stage V requiring chronic dialysis (HCC)   DDD (degenerative  disc disease)   HLD (hyperlipidemia)   Second degree heart block   Atrial flutter (HCC)   ESRD (end stage renal disease) on dialysis ([place])    Possible acute stroke Due to presence of pacemaker, patient could not undergo MRI. Due to recent initiation of dialysis CT with contrast was not done. CT head was repeated yesterday which again did not show any acute process. Patient's speech has improved. Continues to have left-sided facial asymmetry. Carotid Dopplers without any significant stenosis. Echocardiogram is pending. Discussed with neurology, Dr. Erlinda Hong. He does not feel patient needs any further imaging studies. Due to his history of atrial flutter, patient will need to be on anticoagulation. Warfarin to be initiated. PT and OT evaluation. LDL is 99. He has allergies to statin. He is on Zetia and fenofibrate.  Dysuria UA reviewed. No clear evidence for infection. No blood noted as well. Reason for his dysuria is not entirely clear. He does have history of prostate disease and could be related to that. Bladder scan was done which showed urinary retention. Foley catheter will be placed.  End-stage renal disease on hemodialysis. Started dialysis recently. Apparently he is on Tuesday, Thursday, Saturday schedule. Nephrology is following.  History of essential hypertension. Permissive hypertension due to stroke.  Anemia of chronic kidney disease. Stable.  History of atrial flutter. Has not been on anticoagulation as his burden of atrial flutter was low. Now with his stroke/TIA, he will need to be on anticoagulation as his Chads2vasc score is at least 6. This has been discussed with the patient by the neurologist. Due to end-stage renal disease cannot use any of the direct oral anticoagulants. Warfarin will be initiated.  History of pacemaker placement This was placed in 2015 for Mobitz type II. Followed by electrophysiologist.  Hyperlipidemia. Reports intolerance to statin medications. He is  on Zetia and fenofibrate. LDL is 99.  Diabetes mellitus type 2. Patient noted to be on oral medications at home. Continue to monitor CBGs. Hold Amaryl. SSI. HbA1c is 7.0.  DVT Prophylaxis: Subcutaneous heparin    Code Status: Full code  Family Communication: Discussed with the patient Disposition Plan: Management as outlined above. PT had recommended CIR. However, patient has shown improvement in the last 24 hours. PT to reevaluate.    LOS: 0 days   [facility] Pager [phone] 01/31/2017, 8:51 AM  If 7PM-7AM, please contact night-coverage at [email or web address], password [facility]

## 2017-01-31 NOTE — Progress Notes (Signed)
Rehab admissions - I am following for potential acute inpatient rehab admission.  No beds open until Monday.  I will follow progress and then check back on Monday if patient remains in the hospital until that time.  Call me for questions.  #[phone]

## 2017-01-31 NOTE — Progress Notes (Signed)
Foley inserted with NT assistance per order. Clear yellow urine returned back into drainage bag. P.[employer] RN

## 2017-01-31 NOTE — Progress Notes (Signed)
  Echocardiogram 2D Echocardiogram has been performed.  Johny Chess 01/31/2017, 11:05 AM

## 2017-01-31 NOTE — Progress Notes (Signed)
STROKE TEAM PROGRESS NOTE   HISTORY OF PRESENT ILLNESS (per record) This is a 81-yo RH man who presented to the ED after he had an episode of L arm numbness and tingling yesterday 01/29/2017. He was at [facility] when he noted that his left arm felt numb. This was preceded by sweating and dizziness. He states the has also had some slurred speech. His numbness resolved after about 15 minutes but he feels like his speech remains slightly more slurred than usual. He denies any facial droop or symptoms in the LLE. In the ED he had a CTH which showed no acute abnormality. His pacemaker was interrogated and showed no arryhythmia or other cardiac irregularity that correlated with his symptoms. He was admitted for further evaluation.   He states that last night he was trying to walk to the bathroom in his room but had difficulty because his left leg seemed to give out from under him. He does not feel any weakness in his leg right now.   Of note, the patient started hemodialysis on 5/15. He has a pacemaker in place due to a diagnosis of second degree heart block. He has a known h/o atrial fibrillation but is not anticoagulated. He denies any prior history of stroke.   Patient was not administered IV t-PA secondary to delay in arrival. He was admitted for further evaluation and treatment.   SUBJECTIVE (INTERVAL HISTORY) Pt lying in bed and wife is at bedside. Pt and wife recounted HPI with me. He was in restaurant and had sudden onset left arm numbness and tingling, at the same time, he had slurry speech and felt dizzy and nauseous, not able to stand up. Called EMS, and on their arrival, BP 130s. The numbness and tingling lasted about 22min but slurry speech continued today. Wife felt his speech still not baseline but pt felt pretty good about himself. CT negative x 2. He has pacemaker due to AVB II but also found to have Aflutter. Not on AC. He is taking ASA 81mg  at home. Recently started HD for CKD, had some  problem with fistular and thrombosis, following with Dr. Posey Pronto at [facility] and vascular center.    OBJECTIVE Temp:  [97.7 F (36.5 C)-98.2 F (36.8 C)] 98.1 F (36.7 C) (05/18 0554) Pulse Rate:  [69-100] 89 (05/18 0554) Cardiac Rhythm: Ventricular paced (05/18 0700) Resp:  [18-28] 20 (05/18 0554) BP: (128-182)/(60-104) 146/67 (05/18 0554) SpO2:  [91 %-100 %] 91 % (05/18 0554) Weight:  [84.8 kg (186 lb 15.2 oz)-85.8 kg (189 lb 2.5 oz)] 84.8 kg (186 lb 15.2 oz) (05/17 2255)  CBC:  Recent Labs Lab 01/29/17 1139 01/31/17 0352  WBC 11.0* 11.5*  NEUTROABS 9.2*  --   HGB 9.8* 10.1*  HCT 30.0* 30.3*  MCV 91.7 91.3  PLT 174 672    Basic Metabolic Panel:  Recent Labs Lab 01/29/17 1139 01/31/17 0352  NA 134* 134*  K 4.4 3.6  CL 101 96*  CO2 21* 26  GLUCOSE 234* 167*  BUN 72* 37*  CREATININE 6.82* 4.82*  CALCIUM 8.6* 8.2*    Lipid Panel:    Component Value Date/Time   CHOL 176 01/30/2017 0424   TRIG 242 (H) 01/30/2017 0424   HDL 29 (L) 01/30/2017 0424   CHOLHDL 6.1 01/30/2017 0424   VLDL 48 (H) 01/30/2017 0424   LDLCALC 99 01/30/2017 0424   HgbA1c:  Lab Results  Component Value Date   HGBA1C 7.0 (H) 01/30/2017   Urine Drug Screen:  Component Value Date/Time   LABOPIA NONE DETECTED 01/29/2017 1501   COCAINSCRNUR NONE DETECTED 01/29/2017 1501   LABBENZ NONE DETECTED 01/29/2017 1501   AMPHETMU NONE DETECTED 01/29/2017 1501   THCU NONE DETECTED 01/29/2017 1501   LABBARB NONE DETECTED 01/29/2017 1501    Alcohol Level     Component Value Date/Time   ETH <5 01/29/2017 1139    IMAGING I have personally reviewed the radiological images below and agree with the radiology interpretations.  Dg Chest 2 View 01/29/2017 No acute abnormality noted.   Ct Head Wo Contrast 01/30/2017 Stable non contrast CT appearance of the brain. Evidence of chronic small vessel disease but no acute or evolving cerebral infarct is identified by CT.   Ct Head Wo  Contrast 01/29/2017 Mild atrophy with patchy periventricular small vessel disease. No intracranial mass, hemorrhage, or extra-axial fluid collection. No acute appearing infarct evident. There are foci of arteriovascular calcification bilaterally. There are areas of mild paranasal sinus disease. There is probable cerumen in each external auditory canal.   CUS - Bilateral: 1-39% ICA stenosis. Vertebral artery flow is antegrade.  TTE - Mild apical hypokinesis likely related to pacing; overall   vigorous LV systolic function; mild diastolic dysfunction; mild   LVH; trace AI.   PHYSICAL EXAM  Temp:  [97.7 F (36.5 C)-98.4 F (36.9 C)] 98.4 F (36.9 C) (05/18 1115) Pulse Rate:  [69-100] 78 (05/18 1115) Resp:  [18-28] 20 (05/18 1115) BP: (128-182)/(60-104) 143/81 (05/18 1143) SpO2:  [91 %-100 %] 98 % (05/18 1115) Weight:  [186 lb 15.2 oz (84.8 kg)-189 lb 2.5 oz (85.8 kg)] 186 lb 15.2 oz (84.8 kg) (05/17 2255)  General - Well nourished, well developed, in no apparent distress.  Ophthalmologic - Fundi not visualized due to noncooperation.  Cardiovascular - Regular rate and rhythm.  Mental Status -  Level of arousal and orientation to time, place, and person were intact. Language including expression, naming, repetition, comprehension was assessed and found intact. Mild dysarthria Fund of Knowledge was assessed and was intact.   Cranial Nerves II - XII - II - Visual field intact OU. III, IV, VI - Extraocular movements intact. V - Facial sensation intact bilaterally. VII - Facial movement intact bilaterally. VIII - Hearing & vestibular intact bilaterally. X - Palate elevates symmetrically, mild dysarthria. XI - Chin turning & shoulder shrug intact bilaterally. XII - Tongue protrusion intact.  Motor Strength - The patient's strength was normal in all extremities and pronator drift was absent.  Bulk was normal and fasciculations were absent.   Motor Tone - Muscle tone was assessed at  the neck and appendages and was normal.  Reflexes - The patient's reflexes were 1+ in all extremities and he had no pathological reflexes.  Sensory - Light touch, temperature/pinprick were assessed and were symmetrical.    Coordination - The patient had normal movements in the hands with no ataxia or dysmetria.  Tremor was absent.  Gait and Station - deferred   ASSESSMENT/PLAN Mr. HOSAM MCFETRIDGE is a 81 y.o. male with history of AF not on AC, second degree heart block w/ pacer, ESRD on HD, HTN, DB, HLD presenting with L arm numbness and tingling and slurry speech preceded by sweating and dizziness. He did not receive IV t-PA due to delay in arrival.   Possible small subcortical vs. brainstem stroke (CT neg but not able to have MRI), concerning for embolic secondary to afib not on [facility]  Resultant  Mild dysarthria  CT head Small vessel  disease. Atrophy. Sinus dz. Auditory canal cerumen.  Repeat CT head at 24h stable. Small vessel disease.    MRI / MRA pacer  Carotid Doppler  unremarkable  2D Echo  EF 65-70%  LDL 99  HgbA1c 7.0  Heparin 5000 units sq tid for VTE prophylaxis  Diet renal/carb modified with fluid restriction Diet-HS Snack? Nothing; Room service appropriate? Yes; Fluid consistency: Thin  aspirin 81 mg daily prior to admission, now on plavix. Due to aflutter and stroke symptoms, recommend coumadin for stroke prevention. OK with plavix bridge, once INR 2-3, plavix can be discontinued.   Patient counseled to be compliant with his antithrombotic medications  Ongoing aggressive stroke risk factor management  Therapy recommendations:  CIR. Consult ordered  Disposition:  pending  (lives w/ spouse)  Atrial flutter  Home anticoagulation none   CHA2DS2-VASc Score = at least 6, ?2 oral anticoagulation recommended  Age in Years:  ?48   +2    Sex:  Male   0    Hypertension History:  yes   +1     Diabetes Mellitus:  yes   +1   Congestive Heart Failure History:   0  Vascular Disease History:  0     Stroke/TIA/Thromboembolism History:  yes   +2  Add coumadin for stroke prevention. plavix can be discontinued once INR 2-3    Hypertension  Stable  Hyperlipidemia  Home meds:  zetia 10 (no statin), resumed in hospital  LDL 99, goal < 70  Statin intolerance  Recommend to follow up with cardiology and may consider PCSK9 inhibitors.  Diabetes type II  HgbA1c 7.0, goal < 7.0  CBG under control  SSI  Other Stroke Risk Factors  Advanced age  Other Active Problems  ESRD on HD TTS  Anemia of CKD  AVB II degree s/p pacemaker  Hospital day # 0  Neurology will sign off. Please call with questions. Pt will follow up with stroke clinic at [facility] in about 6 weeks. Thanks for the consult.  Rosalin Hawking, MD PhD Stroke Neurology 01/31/2017 2:06 PM   To contact Stroke Continuity provider, please refer to [email or web address] After hours, contact General Neurology

## 2017-02-01 ENCOUNTER — Inpatient Hospital Stay (HOSPITAL_COMMUNITY): Payer: Medicare Other

## 2017-02-01 DIAGNOSIS — Z8249 Family history of ischemic heart disease and other diseases of the circulatory system: Secondary | ICD-10-CM | POA: Diagnosis not present

## 2017-02-01 DIAGNOSIS — I639 Cerebral infarction, unspecified: Secondary | ICD-10-CM | POA: Diagnosis not present

## 2017-02-01 DIAGNOSIS — R29700 NIHSS score 0: Secondary | ICD-10-CM | POA: Diagnosis present

## 2017-02-01 DIAGNOSIS — I69354 Hemiplegia and hemiparesis following cerebral infarction affecting left non-dominant side: Secondary | ICD-10-CM | POA: Diagnosis not present

## 2017-02-01 DIAGNOSIS — I4891 Unspecified atrial fibrillation: Secondary | ICD-10-CM | POA: Diagnosis not present

## 2017-02-01 DIAGNOSIS — Z950 Presence of cardiac pacemaker: Secondary | ICD-10-CM | POA: Diagnosis not present

## 2017-02-01 DIAGNOSIS — R471 Dysarthria and anarthria: Secondary | ICD-10-CM | POA: Diagnosis present

## 2017-02-01 DIAGNOSIS — M898X9 Other specified disorders of bone, unspecified site: Secondary | ICD-10-CM | POA: Diagnosis present

## 2017-02-01 DIAGNOSIS — E1151 Type 2 diabetes mellitus with diabetic peripheral angiopathy without gangrene: Secondary | ICD-10-CM | POA: Diagnosis not present

## 2017-02-01 DIAGNOSIS — Z85820 Personal history of malignant melanoma of skin: Secondary | ICD-10-CM | POA: Diagnosis not present

## 2017-02-01 DIAGNOSIS — R2689 Other abnormalities of gait and mobility: Secondary | ICD-10-CM | POA: Diagnosis not present

## 2017-02-01 DIAGNOSIS — E041 Nontoxic single thyroid nodule: Secondary | ICD-10-CM | POA: Diagnosis present

## 2017-02-01 DIAGNOSIS — Z992 Dependence on renal dialysis: Secondary | ICD-10-CM | POA: Diagnosis not present

## 2017-02-01 DIAGNOSIS — I951 Orthostatic hypotension: Secondary | ICD-10-CM | POA: Diagnosis not present

## 2017-02-01 DIAGNOSIS — E1142 Type 2 diabetes mellitus with diabetic polyneuropathy: Secondary | ICD-10-CM | POA: Diagnosis not present

## 2017-02-01 DIAGNOSIS — R269 Unspecified abnormalities of gait and mobility: Secondary | ICD-10-CM | POA: Diagnosis not present

## 2017-02-01 DIAGNOSIS — E785 Hyperlipidemia, unspecified: Secondary | ICD-10-CM | POA: Diagnosis present

## 2017-02-01 DIAGNOSIS — E1122 Type 2 diabetes mellitus with diabetic chronic kidney disease: Secondary | ICD-10-CM | POA: Diagnosis present

## 2017-02-01 DIAGNOSIS — N186 End stage renal disease: Secondary | ICD-10-CM | POA: Diagnosis present

## 2017-02-01 DIAGNOSIS — I6340 Cerebral infarction due to embolism of unspecified cerebral artery: Secondary | ICD-10-CM | POA: Diagnosis present

## 2017-02-01 DIAGNOSIS — R310 Gross hematuria: Secondary | ICD-10-CM | POA: Diagnosis not present

## 2017-02-01 DIAGNOSIS — I5032 Chronic diastolic (congestive) heart failure: Secondary | ICD-10-CM | POA: Diagnosis present

## 2017-02-01 DIAGNOSIS — I480 Paroxysmal atrial fibrillation: Secondary | ICD-10-CM | POA: Diagnosis not present

## 2017-02-01 DIAGNOSIS — G8194 Hemiplegia, unspecified affecting left nondominant side: Secondary | ICD-10-CM | POA: Diagnosis present

## 2017-02-01 DIAGNOSIS — Z961 Presence of intraocular lens: Secondary | ICD-10-CM | POA: Diagnosis present

## 2017-02-01 DIAGNOSIS — R55 Syncope and collapse: Secondary | ICD-10-CM | POA: Diagnosis not present

## 2017-02-01 DIAGNOSIS — I120 Hypertensive chronic kidney disease with stage 5 chronic kidney disease or end stage renal disease: Secondary | ICD-10-CM | POA: Diagnosis not present

## 2017-02-01 DIAGNOSIS — I69322 Dysarthria following cerebral infarction: Secondary | ICD-10-CM | POA: Diagnosis not present

## 2017-02-01 DIAGNOSIS — N2581 Secondary hyperparathyroidism of renal origin: Secondary | ICD-10-CM | POA: Diagnosis not present

## 2017-02-01 DIAGNOSIS — I4892 Unspecified atrial flutter: Secondary | ICD-10-CM | POA: Diagnosis present

## 2017-02-01 DIAGNOSIS — I132 Hypertensive heart and chronic kidney disease with heart failure and with stage 5 chronic kidney disease, or end stage renal disease: Secondary | ICD-10-CM | POA: Diagnosis present

## 2017-02-01 DIAGNOSIS — I69392 Facial weakness following cerebral infarction: Secondary | ICD-10-CM | POA: Diagnosis not present

## 2017-02-01 DIAGNOSIS — D631 Anemia in chronic kidney disease: Secondary | ICD-10-CM | POA: Diagnosis present

## 2017-02-01 DIAGNOSIS — G2581 Restless legs syndrome: Secondary | ICD-10-CM | POA: Diagnosis not present

## 2017-02-01 DIAGNOSIS — I69398 Other sequelae of cerebral infarction: Secondary | ICD-10-CM | POA: Diagnosis not present

## 2017-02-01 DIAGNOSIS — I6789 Other cerebrovascular disease: Secondary | ICD-10-CM | POA: Diagnosis present

## 2017-02-01 DIAGNOSIS — Z833 Family history of diabetes mellitus: Secondary | ICD-10-CM | POA: Diagnosis not present

## 2017-02-01 DIAGNOSIS — E0821 Diabetes mellitus due to underlying condition with diabetic nephropathy: Secondary | ICD-10-CM | POA: Diagnosis not present

## 2017-02-01 DIAGNOSIS — N200 Calculus of kidney: Secondary | ICD-10-CM | POA: Diagnosis present

## 2017-02-01 DIAGNOSIS — I10 Essential (primary) hypertension: Secondary | ICD-10-CM | POA: Diagnosis not present

## 2017-02-01 DIAGNOSIS — R42 Dizziness and giddiness: Secondary | ICD-10-CM | POA: Diagnosis present

## 2017-02-01 DIAGNOSIS — G458 Other transient cerebral ischemic attacks and related syndromes: Secondary | ICD-10-CM | POA: Diagnosis not present

## 2017-02-01 DIAGNOSIS — R29810 Facial weakness: Secondary | ICD-10-CM | POA: Diagnosis present

## 2017-02-01 LAB — CBC
HCT: 32.8 % — ABNORMAL LOW (ref 39.0–52.0)
Hemoglobin: 10.7 g/dL — ABNORMAL LOW (ref 13.0–17.0)
MCH: 29.7 pg (ref 26.0–34.0)
MCHC: 32.6 g/dL (ref 30.0–36.0)
MCV: 91.1 fL (ref 78.0–100.0)
Platelets: 195 K/uL (ref 150–400)
RBC: 3.6 MIL/uL — ABNORMAL LOW (ref 4.22–5.81)
RDW: 13.8 % (ref 11.5–15.5)
WBC: 13.1 K/uL — ABNORMAL HIGH (ref 4.0–10.5)

## 2017-02-01 LAB — GLUCOSE, CAPILLARY
GLUCOSE-CAPILLARY: 153 mg/dL — AB (ref 65–99)
Glucose-Capillary: 268 mg/dL — ABNORMAL HIGH (ref 65–99)
Glucose-Capillary: 208 mg/dL — ABNORMAL HIGH (ref 65–99)

## 2017-02-01 LAB — BASIC METABOLIC PANEL
ANION GAP: 11 (ref 5–15)
BUN: 56 mg/dL — ABNORMAL HIGH (ref 6–20)
CHLORIDE: 97 mmol/L — AB (ref 101–111)
CO2: 26 mmol/L (ref 22–32)
Calcium: 8.8 mg/dL — ABNORMAL LOW (ref 8.9–10.3)
Creatinine, Ser: 6.25 mg/dL — ABNORMAL HIGH (ref 0.61–1.24)
GFR calc Af Amer: 9 mL/min — ABNORMAL LOW (ref >60)
GFR, EST NON AFRICAN AMERICAN: 8 mL/min — AB (ref >60)
Glucose, Bld: 154 mg/dL — ABNORMAL HIGH (ref 65–99)
POTASSIUM: 4.2 mmol/L (ref 3.5–5.1)
SODIUM: 134 mmol/L — AB (ref 135–145)

## 2017-02-01 LAB — URINE CULTURE

## 2017-02-01 LAB — PROTIME-INR
INR: 1.03
Prothrombin Time: 13.5 s (ref 11.4–15.2)

## 2017-02-01 MED ORDER — CARVEDILOL 12.5 MG PO TABS
12.5000 mg | ORAL_TABLET | Freq: Two times a day (BID) | ORAL | Status: DC
  Administered 2017-02-01 – 2017-02-02 (×2): 12.5 mg via ORAL
  Filled 2017-02-01 (×2): qty 1

## 2017-02-01 MED ORDER — WARFARIN SODIUM 5 MG PO TABS
5.0000 mg | ORAL_TABLET | Freq: Once | ORAL | Status: AC
  Administered 2017-02-01: 5 mg via ORAL
  Filled 2017-02-01: qty 1

## 2017-02-01 MED ORDER — DOXERCALCIFEROL 4 MCG/2ML IV SOLN
INTRAVENOUS | Status: AC
  Administered 2017-02-01: 4 ug via INTRAVENOUS
  Filled 2017-02-01: qty 2

## 2017-02-01 NOTE — Progress Notes (Addendum)
[facility] PROGRESS NOTE  YUVAL RUBENS ZOX:[id number] DOB: 1936/02/20 DOA: 01/29/2017  PCP: [name], MD  Brief History/Interval Summary: 81 year old male with a past medical history of atrial flutter, followed by cardiology, but not on anticoagulation due to low burden, history of pacemaker placement, history of chronic kidney disease, who started dialysis recently, diabetes mellitus, hypertension, presented with sudden onset of diaphoresis, dizziness, left arm weakness and slurred speech. Concern was for TIA versus stroke. Patient was hospitalized for further management.  Reason for Visit: Acute stroke  Consultants: Neurology. Nephrology  Procedures:  Transthoracic echocardiogram Study Conclusions  - Left ventricle: The cavity size was normal. Wall thickness was   increased in a pattern of mild LVH. Systolic function was   vigorous. The estimated ejection fraction was in the range of 65%   to 70%. There is hypokinesis of the apical myocardium. Doppler   parameters are consistent with abnormal left ventricular   relaxation (grade 1 diastolic dysfunction). - Aortic valve: There was trivial regurgitation. - Mitral valve: Calcified annulus.  Impressions:  - Mild apical hypokinesis likely related to pacing; overall   vigorous LV systolic function; mild diastolic dysfunction; mild   LVH; trace AI.  Carotid Doppler Bilateral:  1-39% ICA stenosis.  Vertebral artery flow is antegrade.    Antibiotics: None  Subjective/Interval History: Patient states that he is feeling well. Hasn't had any further issues with speech or arm weakness. Did have bleeding into his Foley bag overnight. No nausea or vomiting.   ROS: No chest pain or shortness of breath  Objective:  Vital Signs  Vitals:   01/31/17 1849 01/31/17 2057 02/01/17 0023 02/01/17 0457  BP:  (!) 146/89 (!) 151/88 (!) 144/83  Pulse:  (!) 55 (!) 59 (!) 59  Resp:  18 20 (!) 183  Temp:  98 F (36.7 C)  97.6 F (36.4 C) 98.9 F (37.2 C)  TempSrc:  Oral Oral Oral  SpO2: 100% 100% 100% 100%  Weight:      Height:        Intake/Output Summary (Last 24 hours) at 02/01/17 0923 Last data filed at 02/01/17 0457  Gross per 24 hour  Intake              480 ml  Output             1100 ml  Net             -620 ml   Filed Weights   01/29/17 1811 01/30/17 1911 01/30/17 2255  Weight: 87.3 kg (192 lb 6.4 oz) 85.8 kg (189 lb 2.5 oz) 84.8 kg (186 lb 15.2 oz)    General appearance: Awake and alert. No distress. Resp: Clear to auscultation bilaterally. No wheezing, rales or rhonchi. Cardio: S1, S2 is normal, regular. No S3, S4. No rubs, murmurs or bruit GI: Abdomen is soft. Nontender, nondistended. Bowel sounds are present. No masses or organomegaly. Foley catheter noted with blood-tinged urine. Extremities: No pedal edema Neurologic: Oriented 3. Speech appears to be normal. Left-sided facial droop has improved. Tongue is midline. Strength is normal. Bilateral upper and lower extremities.   Lab Results:  Data Reviewed: I have personally reviewed following labs and imaging studies  CBC:  Recent Labs Lab 01/29/17 1139 01/31/17 0352 02/01/17 0419  WBC 11.0* 11.5* 13.1*  NEUTROABS 9.2*  --   --   HGB 9.8* 10.1* 10.7*  HCT 30.0* 30.3* 32.8*  MCV 91.7 91.3 91.1  PLT 174 167 195  Basic Metabolic Panel:  Recent Labs Lab 01/29/17 1139 01/31/17 0352 02/01/17 0419  NA 134* 134* 134*  K 4.4 3.6 4.2  CL 101 96* 97*  CO2 21* 26 26  GLUCOSE 234* 167* 154*  BUN 72* 37* 56*  CREATININE 6.82* 4.82* 6.25*  CALCIUM 8.6* 8.2* 8.8*    GFR: Estimated Creatinine Clearance: 10.1 mL/min (A) (by C-G formula based on SCr of 6.25 mg/dL (H)).  Liver Function Tests:  Recent Labs Lab 01/29/17 1139  AST 13*  ALT 15*  ALKPHOS 65  BILITOT 0.5  PROT 5.5*  ALBUMIN 2.9*     Coagulation Profile:  Recent Labs Lab 01/29/17 1139 02/01/17 0419  INR 1.02 1.03    CBG:  Recent Labs Lab  01/31/17 0643 01/31/17 1111 01/31/17 1633 01/31/17 2056 02/01/17 0619  GLUCAP 158* 156* 257* 137* 153*    Lipid Profile:  Recent Labs  01/30/17 0424  CHOL 176  HDL 29*  LDLCALC 99  TRIG 242*  CHOLHDL 6.1    Radiology Studies: Ct Head Wo Contrast  Result Date: 01/30/2017 CLINICAL DATA:  81 year old male with possible stroke. Resorption rest that resolved left upper extremity numbness and tingling. Dizziness and slurred speech. EXAM: CT HEAD WITHOUT CONTRAST TECHNIQUE: Contiguous axial images were obtained from the base of the skull through the vertex without intravenous contrast. COMPARISON:  Head CT without contrast 01/29/2017. FINDINGS: Brain: No acute intracranial hemorrhage identified. No midline shift, mass effect, or evidence of intracranial mass lesion. Stable ventricle size and configuration. Stable gray-white matter differentiation throughout the brain. Patchy white matter hypodensity and chronic appearing left caudate lacunar infarct. Small chronic appearing right superior cerebellar infarcts. No acute or evolving infarct is identified. Vascular: Calcified atherosclerosis at the skull base. No suspicious intracranial vascular hyperdensity. Skull: Negative.  No acute osseous abnormality identified. Sinuses/Orbits: Visualized paranasal sinuses and mastoids are stable and well pneumatized. Other: Stable and negative orbit and scalp soft tissues. IMPRESSION: Stable non contrast CT appearance of the brain. Evidence of chronic small vessel disease but no acute or evolving cerebral infarct is identified by CT. Electronically Signed   By: Genevie Ann M.D.   On: 01/30/2017 12:54     Medications:  Scheduled: . calcium acetate  1,334 mg Oral TID WC  . clopidogrel  75 mg Oral Daily  . doxercalciferol  4 mcg Intravenous Q T,Th,Sa-HD  . ezetimibe  10 mg Oral Daily  . feeding supplement (PRO-STAT SUGAR FREE 64)  30 mL Oral BID  . fenofibrate  160 mg Oral Daily  . finasteride  5 mg Oral  Daily  . heparin  5,000 Units Subcutaneous Q8H  . insulin aspart  0-5 Units Subcutaneous QHS  . insulin aspart  0-9 Units Subcutaneous TID WC  . ipratropium  2 spray Each Nare Daily  . multivitamin  1 tablet Oral QHS  . Warfarin - Pharmacist Dosing Inpatient   Does not apply q1800   Continuous:  VOZ:[id number] **OR** acetaminophen (TYLENOL) oral liquid 160 mg/5 mL **OR** acetaminophen, ondansetron (ZOFRAN) IV, polyvinyl alcohol  Assessment/Plan:  Principal Problem:   TIA (transient ischemic attack) Active Problems:   Hypertension   CKD (chronic kidney disease) stage V requiring chronic dialysis (HCC)   DDD (degenerative disc disease)   HLD (hyperlipidemia)   Second degree heart block   Atrial flutter (HCC)   ESRD (end stage renal disease) on dialysis ([place])    Possible acute stroke Due to presence of pacemaker, patient could not undergo MRI. Due to recent initiation of dialysis  CT with contrast was not done. CT head was repeated which again did not show any acute process. Patient's speech has improved. Carotid Dopplers without any significant stenosis. Echocardiogram is pending. Seen by neurology. They recommended anticoagulation due to history of atrial flutter. Warfarin was initiated. Plavix was continued for bridging purposes. However, in view of the hematuria, discussed with neurology. Okay to stop Plavix for now. May even have to stop his warfarin depending on urology input. LDL is 99. He is allergic to statin. He is on Zetia and fenofibrate. PT and OT evaluation. Inpatient rehabilitation was recommended.   Hematuria/Urinary Retention No infection noted on UA. Patient was found to have urinary retention. Foley catheter had to be placed. Subsequently, he developed hematuria. Since patient will need to be on anticoagulation as mentioned above, a urology consult will be obtained. Discussed with Dr. Tresa Moore with urology. Patient does have a history of prostate disease and is on  finasteride and tamsulosin at home. Not noted to be on tamsulosin here in the hospital. We will wait for urology input.   End-stage renal disease on hemodialysis. Started dialysis recently. He is on Tuesday, Thursday, Saturday schedule. Nephrology is following.  History of essential hypertension. Initial permissive hypertension. Resume carvedilol.  Anemia of chronic kidney disease. Stable.  History of atrial flutter. Has not been on anticoagulation as his burden of atrial flutter was low. Now with his stroke/TIA, he will need to be on anticoagulation as his Chads2vasc score is at least 6. This has been discussed with the patient by the neurologist. Due to end-stage renal disease cannot use any of the direct oral anticoagulants. Warfarin was initiated.  History of pacemaker placement This was placed in 2015 for Mobitz type II. Followed by electrophysiologist.  Hyperlipidemia. Reports intolerance to statin medications. He is on Zetia and fenofibrate. LDL is 99.  Diabetes mellitus type 2. Patient noted to be on oral medications at home. Continue to monitor CBGs. Holding Amaryl. SSI. HbA1c is 7.0.  DVT Prophylaxis: Subcutaneous heparin    Code Status: Full code  Family Communication: Discussed with the patient Disposition Plan: Management as outlined above. PT and OT continue to recommend CIR.    LOS: 0 days   [facility] Pager [phone] 02/01/2017, 9:23 AM  If 7PM-7AM, please contact night-coverage at [email or web address], password [email or web address]

## 2017-02-01 NOTE — Progress Notes (Signed)
ANTICOAGULATION CONSULT NOTE - FOLLOW UP  Pharmacy Consult:  Coumadin Indication: atrial fibrillation  Allergies  Allergen Reactions  . Amlodipine Besylate Swelling    angioedema  . Lisinopril Other (See Comments)    Renal insufficiency  . Statins Other (See Comments)    Muscle aches    Patient Measurements: Height: 5\' 10"  (177.8 cm) Weight: 186 lb 15.2 oz (84.8 kg) IBW/kg (Calculated) : 73  Vital Signs: Temp: 98.9 F (37.2 C) (05/19 0457) Temp Source: Oral (05/19 0457) BP: 144/83 (05/19 0457) Pulse Rate: 59 (05/19 0457)  Labs:  Recent Labs  01/29/17 1139 01/31/17 0352 02/01/17 0419  HGB 9.8* 10.1* 10.7*  HCT 30.0* 30.3* 32.8*  PLT 174 167 195  APTT 31  --   --   LABPROT 13.4  --  13.5  INR 1.02  --  1.03  CREATININE 6.82* 4.82* 6.25*    Estimated Creatinine Clearance: 10.1 mL/min (A) (by C-G formula based on SCr of 6.25 mg/dL (H)).    Assessment: 89 YOM with history of Aflutter.  He has not been on anticoagulation due to low risk for stroke.  Now with acute stroke/TIA and Pharmacy consulted to manage Coumadin.  Patient's INR is sub-therapeutic as expected.  No bleeding reported.   Goal of Therapy:  INR 2-3   Plan:  - Repeat Coumadin 5mg  PO today - Heparin 5000 units SQ Q8H per MD.  D/C when INR therapeutic - Daily PT / INR    Blase Beckner D. Mina Marble, PharmD, BCPS Pager:  [phone] 02/01/2017, 9:39 AM

## 2017-02-01 NOTE — Progress Notes (Signed)
HD tx completed @ 1908 w/o problem, UF goal met, blood rinsed back, report called to primary nurse by Lesly Rubenstein, RN prior to tx end,

## 2017-02-01 NOTE — Consult Note (Signed)
Reason for Consult: Gross Hematuria, Elevated PSA, Nephrolithiasis, End Stage Renal Disease, Lower Urinary Tract Symptoms / Enlarged Prostate  Referring Physician: Pleas Koch David  James David is an 81 y.o. male.   HPI:   1 - Gross Hematuria - new gross hematuria noted during hospitalization for CVA. This started after catheter placed, UA prior normal.  Had  eval 2011 with cysto and [place] that revealed only BPH. Non-con CT this admission with only large prostate as likely source. This is improving already with time. He is on finasteride.   2 - Elevated PSA - long h/o elevatd PSA s/p negative biopsy x 2 prior most recently 2006. Now stable in 3's on finasteride  3 -  Nephrolithiasis - punctate (too small to measure) intrarenal stones by hematurai CT 2018. No obsruction.   4 -  End Stage Renal Disease - on hemodialysis since 2018 for progressive medical renal disease, imaging x several w/o hydro.  5 - Lower Urinary Tract Symptoms / Enlarged Prostate - on finasteride + tamsulosin at [facility] for obstructive LUTS. Prostate vol 27m by CT 01/2017.   PMH sig for AFib/Heartblock, CVA, melanoma resections. NO chest / abd surgery. His PCP is James David.  Today "James David is seen in consultation for above, specifically for opinion on gross hematuria in setting of need for anticoagulation due to CVA.   Past Medical History:  Diagnosis Date  . Atrial fibrillation (James David   . Chronic kidney disease, stage IV (severe) (James David)    baseline creatinine 2-3  . DDD (degenerative disc disease)   . Diabetes mellitus without complication ([facility]   . Hyperlipidemia   . Hypertension   . Melanoma (James David 2010   removed at James David . Second degree heart block    a. s/p STJ dual chamber PPM - Dr James David   Past Surgical History:  Procedure Laterality Date  . AV FISTULA PLACEMENT Right 05/31/2014   Procedure: Right Arm Brachiocephalic ARTERIOVENOUS FISTULA CREATION  ;  Surgeon: James David;  Location: [facility]   Service: Vascular;  Laterality: Right;  . CATARACT EXTRACTION W/ INTRAOCULAR LENS  IMPLANT, BILATERAL    . COLONOSCOPY    . EXCISION MELANOMA WITH SENTINEL LYMPH NODE BIOPSY Right 02/08/2016   Procedure: WIDE EXCISION RIGHT SHOULDER MELANOMA WITH RIGHT SENTINEL LYMPH NODE BIOPSY;  Surgeon: James David;  [facility]  Service: General;  Laterality: Right;  . LAMINECTOMY    . PERMANENT PACEMAKER INSERTION N/A 01/20/2014   STJ Assurity dual chamber pacemaker implanted by Dr James Hemanfor 2nd degree AV block    Family History  Problem Relation Age of Onset  . Pulmonary embolism Mother   . Diabetes Mother   . Heart attack Father   . Diabetes Brother     Social History:  reports that he has never smoked. He has never used smokeless tobacco. He reports that he does not drink alcohol or use drugs.  Allergies:  Allergies  Allergen Reactions  . Amlodipine Besylate Swelling    angioedema  . Lisinopril Other (See Comments)    Renal insufficiency  . Statins Other (See Comments)    Muscle aches    Medications: I have reviewed the patient's current medications.  Results for orders placed or performed during the hospital encounter of 01/29/17 (from the past 48 hour(s))  Glucose, capillary     Status: Abnormal   Collection Time: 01/30/17  4:14 PM  Result Value Ref Range   Glucose-Capillary 115 (H) 65 -  99 mg/dL  Hepatitis B surface antigen     Status: None   Collection Time: 01/30/17  7:17 PM  Result Value Ref Range   Hepatitis B Surface Ag Negative Negative    Comment: (NOTE) STAT CALLED TO RIA WILSON @ 2:14am Performed At: [facility] [place], [place] [id number] [place] Romp David HY:[id number]   Glucose, capillary     Status: Abnormal   Collection Time: 01/31/17  1:24 AM  Result Value Ref Range   Glucose-Capillary 142 (H) 65 - 99 mg/dL  CBC     Status: Abnormal   Collection Time: 01/31/17  3:52 AM  Result Value Ref Range   WBC 11.5 (H) 4.0 - 10.5  K/uL   RBC 3.32 (L) 4.22 - 5.81 MIL/uL   Hemoglobin 10.1 (L) 13.0 - 17.0 g/dL   HCT 30.3 (L) 39.0 - 52.0 %   MCV 91.3 78.0 - 100.0 fL   MCH 30.4 26.0 - 34.0 pg   MCHC 33.3 30.0 - 36.0 g/dL   RDW 14.1 11.5 - 15.5 %   Platelets 167 150 - 400 K/uL  Basic metabolic panel     Status: Abnormal   Collection Time: 01/31/17  3:52 AM  Result Value Ref Range   Sodium 134 (L) 135 - 145 mmol/L   Potassium 3.6 3.5 - 5.1 mmol/L    Comment: DELTA CHECK NOTED   Chloride 96 (L) 101 - 111 mmol/L   CO2 26 22 - 32 mmol/L   Glucose, Bld 167 (H) 65 - 99 mg/dL   BUN 37 (H) 6 - 20 mg/dL   Creatinine, Ser 4.82 (H) 0.61 - 1.24 mg/dL   Calcium 8.2 (L) 8.9 - 10.3 mg/dL   GFR calc non Af Amer 10 (L) >60 mL/min   GFR calc Af Amer 12 (L) >60 mL/min    Comment: (NOTE) The eGFR has been calculated using the CKD EPI equation. This calculation has not been validated in all clinical situations. eGFR's persistently <60 mL/min signify possible Chronic Kidney Disease.    Anion gap 12 5 - 15  Glucose, capillary     Status: Abnormal   Collection Time: 01/31/17  6:43 AM  Result Value Ref Range   Glucose-Capillary 158 (H) 65 - 99 mg/dL   Comment 1 Notify RN    Comment 2 Document in Chart   Glucose, capillary     Status: Abnormal   Collection Time: 01/31/17 11:11 AM  Result Value Ref Range   Glucose-Capillary 156 (H) 65 - 99 mg/dL  Urinalysis, Routine w reflex microscopic     Status: Abnormal   Collection Time: 01/31/17 11:33 AM  Result Value Ref Range   Color, Urine YELLOW YELLOW   APPearance CLEAR CLEAR   Specific Gravity, Urine 1.008 1.005 - 1.030   pH 8.0 5.0 - 8.0   Glucose, UA >=500 (A) NEGATIVE mg/dL   Hgb urine dipstick NEGATIVE NEGATIVE   Bilirubin Urine NEGATIVE NEGATIVE   Ketones, ur 5 (A) NEGATIVE mg/dL   Protein, ur >=300 (A) NEGATIVE mg/dL   Nitrite NEGATIVE NEGATIVE   Leukocytes, UA NEGATIVE NEGATIVE   RBC / HPF 0-5 0 - 5 RBC/hpf   WBC, UA 0-5 0 - 5 WBC/hpf   Bacteria, UA RARE (A) NONE SEEN    Squamous Epithelial / LPF 0-5 (A) NONE SEEN  Culture, Urine     Status: Abnormal   Collection Time: 01/31/17 11:34 AM  Result Value Ref Range   Specimen Description URINE, CLEAN CATCH  Special Requests NONE    Culture MULTIPLE SPECIES PRESENT, SUGGEST RECOLLECTION (A)    Report Status 02/01/2017 FINAL   Glucose, capillary     Status: Abnormal   Collection Time: 01/31/17  4:33 PM  Result Value Ref Range   Glucose-Capillary 257 (H) 65 - 99 mg/dL  Glucose, capillary     Status: Abnormal   Collection Time: 01/31/17  8:56 PM  Result Value Ref Range   Glucose-Capillary 137 (H) 65 - 99 mg/dL   Comment 1 Notify RN    Comment 2 Document in Chart   CBC     Status: Abnormal   Collection Time: 02/01/17  4:19 AM  Result Value Ref Range   WBC 13.1 (H) 4.0 - 10.5 K/uL   RBC 3.60 (L) 4.22 - 5.81 MIL/uL   Hemoglobin 10.7 (L) 13.0 - 17.0 g/dL   HCT 32.8 (L) 39.0 - 52.0 %   MCV 91.1 78.0 - 100.0 fL   MCH 29.7 26.0 - 34.0 pg   MCHC 32.6 30.0 - 36.0 g/dL   RDW 13.8 11.5 - 15.5 %   Platelets 195 150 - 400 K/uL  Basic metabolic panel     Status: Abnormal   Collection Time: 02/01/17  4:19 AM  Result Value Ref Range   Sodium 134 (L) 135 - 145 mmol/L   Potassium 4.2 3.5 - 5.1 mmol/L   Chloride 97 (L) 101 - 111 mmol/L   CO2 26 22 - 32 mmol/L   Glucose, Bld 154 (H) 65 - 99 mg/dL   BUN 56 (H) 6 - 20 mg/dL   Creatinine, Ser 6.25 (H) 0.61 - 1.24 mg/dL   Calcium 8.8 (L) 8.9 - 10.3 mg/dL   GFR calc non Af Amer 8 (L) >60 mL/min   GFR calc Af Amer 9 (L) >60 mL/min    Comment: (NOTE) The eGFR has been calculated using the CKD EPI equation. This calculation has not been validated in all clinical situations. eGFR's persistently <60 mL/min signify possible Chronic Kidney Disease.    Anion gap 11 5 - 15  Protime-INR     Status: None   Collection Time: 02/01/17  4:19 AM  Result Value Ref Range   Prothrombin Time 13.5 11.4 - 15.2 seconds   INR 1.03   Glucose, capillary     Status: Abnormal    Collection Time: 02/01/17  6:19 AM  Result Value Ref Range   Glucose-Capillary 153 (H) 65 - 99 mg/dL   Comment 1 Notify RN    Comment 2 Document in Chart   Glucose, capillary     Status: Abnormal   Collection Time: 02/01/17 11:26 AM  Result Value Ref Range   Glucose-Capillary 268 (H) 65 - 99 mg/dL    Ct Head Wo Contrast  Result Date: 01/30/2017 CLINICAL DATA:  81 year old male with possible stroke. Resorption rest that resolved left upper extremity numbness and tingling. Dizziness and slurred speech. EXAM: CT HEAD WITHOUT CONTRAST TECHNIQUE: Contiguous axial images were obtained from the base of the skull through the vertex without intravenous contrast. COMPARISON:  Head CT without contrast 01/29/2017. FINDINGS: Brain: No acute intracranial hemorrhage identified. No midline shift, mass effect, or evidence of intracranial mass lesion. Stable ventricle size and configuration. Stable gray-white matter differentiation throughout the brain. Patchy white matter hypodensity and chronic appearing left caudate lacunar infarct. Small chronic appearing right superior cerebellar infarcts. No acute or evolving infarct is identified. Vascular: Calcified atherosclerosis at the skull base. No suspicious intracranial vascular hyperdensity. Skull: Negative.  No  acute osseous abnormality identified. Sinuses/Orbits: Visualized paranasal sinuses and mastoids are stable and well pneumatized. Other: Stable and negative orbit and scalp soft tissues. IMPRESSION: Stable non contrast CT appearance of the brain. Evidence of chronic small vessel disease but no acute or evolving cerebral infarct is identified by CT. Electronically Signed   By: Genevie Ann M.D.   On: 01/30/2017 12:54    Review of Systems  Constitutional: Negative.  Negative for chills and fever.  HENT: Negative.   Eyes: Negative.   Respiratory: Negative.   Cardiovascular: Negative.   Gastrointestinal: Negative.   Genitourinary: Positive for hematuria.   Musculoskeletal: Negative.   Skin: Negative.   Neurological: Negative.   Endo/Heme/Allergies: Negative.   Psychiatric/Behavioral: Negative.    Blood pressure (!) 153/53, pulse 63, temperature 98.4 F (36.9 C), temperature source Oral, resp. rate 18, height _0  (1.778 m), weight 84.8 kg (186 lb 15.2 oz), SpO2 97 %. Physical Exam  Constitutional: He is oriented to person, place, and time. He appears well-developed.  HENT:  Head: Normocephalic.  Eyes: Pupils are equal, round, and reactive to light.  Neck: Normal range of motion.  Cardiovascular: Normal rate.   Respiratory: Effort normal.  GI: Soft.  Genitourinary:  Genitourinary Comments: UNcircumcised. 48F catheter in place with some thin scant hematuria. Repositioned strap to place on mild tension. No CVAT.   Musculoskeletal: Normal range of motion.  Neurological: He is alert and oriented to person, place, and time.  Skin: Skin is warm.  Psychiatric: He has a normal mood and affect.    Assessment/Plan:   1 - Gross Hematuria - This is most certainly coming from enalarged prostate in setting of minor cathete trauma and ongoign anticoagulation. Amt of bleeding is not impressive. Rec keep current catheter (i placed on some tension to to keep pressure on prostatic fossa and explained to nurse) and OK to continue anticoagulation. Should this become large volume / progressive / associated with clot retention, then would need operative intervention with fulgeration. At this point I doubt that will be necessary.   2 - Elevated PSA - stable in setting of negative BX, observe at his age and level of comorbidity.   3 -  Nephrolithiasis - tiny volume, non-obstructing. Observe.   4 -  End Stage Renal Disease - this is medical renal disease, per nephrology  5 - Lower Urinary Tract Symptoms / Enlarged Prostate - continue finasteride and tamsulosin.  Please call me with questions.   Rylee Nuzum 02/01/2017, 12:33 PM

## 2017-02-01 NOTE — Progress Notes (Signed)
  [facility] Progress Note   Subjective: NO NEW C/O  Vitals:   01/31/17 2057 02/01/17 0023 02/01/17 0457 02/01/17 0956  BP: (!) 146/89 (!) 151/88 (!) 144/83 (!) 153/53  Pulse: (!) 55 (!) 59 (!) 59 63  Resp: 18 20 (!) 183 18  Temp: 98 F (36.7 C) 97.6 F (36.4 C) 98.9 F (37.2 C) 98.4 F (36.9 C)  TempSrc: Oral Oral Oral Oral  SpO2: 100% 100% 100% 97%  Weight:      Height:        Inpatient medications: . calcium acetate  1,334 mg Oral TID WC  . clopidogrel  75 mg Oral Daily  . doxercalciferol  4 mcg Intravenous Q T,Th,Sa-HD  . ezetimibe  10 mg Oral Daily  . feeding supplement (PRO-STAT SUGAR FREE 64)  30 mL Oral BID  . fenofibrate  160 mg Oral Daily  . finasteride  5 mg Oral Daily  . heparin  5,000 Units Subcutaneous Q8H  . insulin aspart  0-5 Units Subcutaneous QHS  . insulin aspart  0-9 Units Subcutaneous TID WC  . ipratropium  2 spray Each Nare Daily  . multivitamin  1 tablet Oral QHS  . warfarin  5 mg Oral ONCE-1800  . Warfarin - Pharmacist Dosing Inpatient   Does not apply q1800    acetaminophen **OR** acetaminophen (TYLENOL) oral liquid 160 mg/5 mL **OR** acetaminophen, ondansetron (ZOFRAN) IV, polyvinyl alcohol  Exam: Alert, elderly WM, no distress No jvd Chest clear bilat RRR no mrg Abd soft ntnd no ascites Ext no edema RUA AVF +bruising, +bruit NF, some R leg> arm weakness  Dialysis: T[facility] HD time ^'ing w/ each HD (3:30 ordered)  300/800 (^'ing)  85.5kg   2/2.25 bath Hep none  AVF RUA - Hectoral 12mcg IV q HD - Venofer 50mg  IV weekly      Assessment: 1  CVA, acute - L sided weakness, slurred speech. Speech better , having gait problems. CIR evaluating 2  Urine retention - foley in, bloody urine 3  ESRD recent HD start 2 wks ago, AVF infiltration but still using 4  Anemia of ESRD no esa yet 5  MBD no chg here 6  Hx BPH - foley in place now 7  DM2 8  Vol no vol excess, under dry  Plan - HD today, no UF   Kelly Splinter  MD [facility] pager [phone]   02/01/2017, 11:43 AM    Recent Labs Lab 01/29/17 1139 01/31/17 0352 02/01/17 0419  NA 134* 134* 134*  K 4.4 3.6 4.2  CL 101 96* 97*  CO2 21* 26 26  GLUCOSE 234* 167* 154*  BUN 72* 37* 56*  CREATININE 6.82* 4.82* 6.25*  CALCIUM 8.6* 8.2* 8.8*    Recent Labs Lab 01/29/17 1139  AST 13*  ALT 15*  ALKPHOS 65  BILITOT 0.5  PROT 5.5*  ALBUMIN 2.9*    Recent Labs Lab 01/29/17 1139 01/31/17 0352 02/01/17 0419  WBC 11.0* 11.5* 13.1*  NEUTROABS 9.2*  --   --   HGB 9.8* 10.1* 10.7*  HCT 30.0* 30.3* 32.8*  MCV 91.7 91.3 91.1  PLT 174 167 195   Iron/TIBC/Ferritin/ %Sat No results found for: IRON, TIBC, FERRITIN, IRONPCTSAT

## 2017-02-01 NOTE — NC FL2 (Signed)
[place] LEVEL OF CARE SCREENING TOOL     IDENTIFICATION  Patient Name: James David Birthdate: [birth year] Sex: male Admission Date (Current Location): 01/29/2017  [facility] and [place] Number:  Herbalist and Address:  [facility], [place] [address] [place], [place] [address]      Provider Number: [id number]  Attending Physician Name and Address:  Bonnielee Haff, MD  Relative Name and Phone Number:       Current Level of Care: Hospital Recommended Level of Care: [place] Prior Approval Number:    Date Approved/Denied:   PASRR Number: [id number]  Discharge Plan: SNF    Current Diagnoses: Patient Active Problem List   Diagnosis Date Noted  . TIA (transient ischemic attack) 01/29/2017  . Hypertension 01/29/2017  . CKD (chronic kidney disease) stage V requiring chronic dialysis ([place]) 01/29/2017  . DDD (degenerative disc disease) 01/29/2017  . HLD (hyperlipidemia) 01/29/2017  . Second degree heart block 01/29/2017  . Atrial flutter ([place]) 01/29/2017  . ESRD (end stage renal disease) on dialysis ([place])   . Melanoma of skin ([place]) 03/13/2016  . Atrial flutter ([place]) 06/26/2015  . Pacemaker 10/13/2014  . End stage renal disease ([place]) 07/15/2014  . Chronic diastolic heart failure ([place]) 03/24/2014  . Second-degree heart block 12/13/2013  . Chronic kidney disease, stage IV (severe) ([place]) 12/13/2013  . Essential hypertension 12/13/2013  . Diabetes mellitus due to underlying condition with diabetic nephropathy ([place]) 12/13/2013  . Hyperlipidemia 12/13/2013    Orientation RESPIRATION BLADDER Height & Weight     Self, Time, Situation, Place  Normal External catheter Weight: 186 lb 15.2 oz (84.8 kg) Height:  5\' 10"  (177.8 cm)  BEHAVIORAL SYMPTOMS/MOOD NEUROLOGICAL BOWEL NUTRITION STATUS      Continent Diet (Renal/carb modified diet with fluid restrictions; thin fluids)  AMBULATORY STATUS COMMUNICATION OF NEEDS  Skin   Limited Assist Verbally Normal                       Personal Care Assistance Level of Assistance  Bathing, Feeding, Dressing Bathing Assistance: Limited assistance Feeding assistance: Independent Dressing Assistance: Limited assistance     Functional Limitations Info  Sight, Hearing, Speech Sight Info: Adequate Hearing Info: Adequate Speech Info: Adequate    SPECIAL CARE FACTORS FREQUENCY  PT (By licensed PT), OT (By licensed OT)     PT Frequency: 5x OT Frequency: 5x            Contractures Contractures Info: Not present    Additional Factors Info  Code Status, Allergies, Insulin Sliding Scale Code Status Info: Full Allergies Info: Amlodipine Besylate, Lisinopril, Statins   Insulin Sliding Scale Info: See med list       Current Medications (02/01/2017):  This is the current hospital active medication list Current Facility-Administered Medications  Medication Dose Route Frequency Provider Last Rate Last Dose  . acetaminophen (TYLENOL) tablet 650 mg  650 mg Oral Q4H PRN Samella Parr, NP   650 mg at 02/01/17 0029   Or  . acetaminophen (TYLENOL) solution 650 mg  650 mg Per Tube Q4H PRN Samella Parr, NP       Or  . acetaminophen (TYLENOL) suppository 650 mg  650 mg Rectal Q4H PRN Samella Parr, NP      . calcium acetate (PHOSLO) capsule 1,334 mg  1,334 mg Oral TID WC Samella Parr, NP   1,334 mg at 02/01/17 0800  . clopidogrel (PLAVIX) tablet  75 mg  75 mg Oral Daily Darrel Reach, MD   75 mg at 02/01/17 8768  . doxercalciferol (HECTOROL) injection 4 mcg  4 mcg Intravenous Q T,Th,Sa-HD Loren Racer, PA-C   4 mcg at 01/30/17 2159  . ezetimibe (ZETIA) tablet 10 mg  10 mg Oral Daily Samella Parr, NP   10 mg at 02/01/17 1000  . feeding supplement (PRO-STAT SUGAR FREE 64) liquid 30 mL  30 mL Oral BID Loren Racer, PA-C   30 mL at 02/01/17 0905  . fenofibrate tablet 160 mg  160 mg Oral Daily Samella Parr, NP   160 mg at  02/01/17 0904  . finasteride (PROSCAR) tablet 5 mg  5 mg Oral Daily Samella Parr, NP   5 mg at 02/01/17 1000  . heparin injection 5,000 Units  5,000 Units Subcutaneous Q8H Samella Parr, NP   5,000 Units at 02/01/17 [phone]  . insulin aspart (novoLOG) injection 0-5 Units  0-5 Units Subcutaneous QHS Erin Hearing L, NP      . insulin aspart (novoLOG) injection 0-9 Units  0-9 Units Subcutaneous TID WC Samella Parr, NP   2 Units at 02/01/17 [phone]  . ipratropium (ATROVENT) 0.06 % nasal spray 2 spray  2 spray Each Nare Daily Samella Parr, NP   2 spray at 02/01/17 1000  . multivitamin (RENA-VIT) tablet 1 tablet  1 tablet Oral QHS Ernest Haber, PA-C   1 tablet at 01/31/17 2119  . ondansetron (ZOFRAN) injection 4 mg  4 mg Intravenous Q6H PRN Bonnielee Haff, MD      . polyvinyl alcohol (LIQUIFILM TEARS) 1.4 % ophthalmic solution 2 drop  2 drop Both Eyes Daily PRN Samella Parr, NP   2 drop at 01/31/17 2117  . warfarin (COUMADIN) tablet 5 mg  5 mg Oral ONCE-1800 Tyrone Apple, RPH      . Warfarin - Pharmacist Dosing Inpatient   Does not apply q1800 Masters, Jake Church, [facility]         Discharge Medications: Please see discharge summary for a list of discharge medications.  Relevant Imaging Results:  Relevant Lab Results:   Additional Information SSN: [id number]  Truitt Merle, LCSW

## 2017-02-01 NOTE — Clinical Social Work Note (Addendum)
Clinical Social Work Assessment  Patient Details  Name: James David MRN: [id number] Date of Birth: 05/06/1936  Date of referral:  02/01/17               Reason for consult:  Discharge Planning                Permission sought to share information with:  Family Supports Permission granted to share information::  Yes, Verbal Permission Granted  Name::     James David  Agency::  SNFs  Relationship::  Spouse  Contact Information:  [phone]  Housing/Transportation Living arrangements for the past 2 months:  [place] of Information:  Patient, Spouse Patient Interpreter Needed:  None Criminal Activity/Legal Involvement Pertinent to Current Situation/Hospitalization:  No - Comment as needed Significant Relationships:  Adult Children, Spouse, Siblings Lives with:  Spouse Do you feel safe going back to the place where you live?  Yes Need for family participation in patient care:  Yes (Comment)  Care giving concerns:  No care giving concerns identified.    Social Worker assessment / plan:  CSW met with pt at bedside to address consult for SNF backup to CIR. Pt spouse-[name], dtrs, and siblings present. CSW introduced herself and explained role of social work. P/T is recommending CIR. CSW explained discharging to SNF with a managed Medicare. CSW provided SNF listing for review. Pt agreeable to SNF as a backup to CIR. Pt preference is [facility], [place]. Pt now inpatient status.    CSW sent FL-2 to SNFs and will follow up on bed offer at [facility]. CSW will continue to follow.   Employment status:  Retired Forensic scientist:  [employer]) PT Recommendations:  Inpatient [place] / Referral to community resources:  [place]  Patient/Family's Response to care:  Pt and family appreciative of CSW support and guidance.   Patient/Family's Understanding of and Emotional  Response to Diagnosis, Current Treatment, and Prognosis:  Pt and family expressed good understanding of current condition.   Emotional Assessment Appearance:  Appears stated age Attitude/Demeanor/Rapport:  Other (Appropriate) Affect (typically observed):  Pleasant, Adaptable, Accepting Orientation:  Oriented to Self, Oriented to Place, Oriented to  Time, Oriented to Situation Alcohol / Substance use:  Other Psych involvement (Current and /or in the community):  No (Comment)  Discharge Needs  Concerns to be addressed:  Care Coordination Readmission within the last 30 days:  No Current discharge risk:  Dependent with Mobility Barriers to Discharge:  Continued Medical Work up   [employer], LCSW 02/01/2017, 4:24 PM

## 2017-02-01 NOTE — Progress Notes (Addendum)
Spoke with patient's wife. Discussed DC options. Patient is currently in observation status, would not qualify for CIR in observation. Has managed medicare plan and is closely being followed by utilization review team to see if patient qualifies for inpatient status. CM spoke with CSW Vivi Barrack to discuss SNF options as a backup for DC if patient remains in OBS at time of DC.  13:50 Spoke with UR team, patient will meet inpatient criteria. Updated patient's wife that if patient continues to have intensity of services provided that require inpatient hospitalization through Monday morning that he would be a candidate for CIR. Otherwise, wife prepared that a DC on Sunday would be to SNF, discussed her preference would be [facility] in [place]. CSW following.

## 2017-02-02 ENCOUNTER — Inpatient Hospital Stay (HOSPITAL_COMMUNITY): Payer: Medicare Other

## 2017-02-02 LAB — BASIC METABOLIC PANEL
Anion gap: 9 (ref 5–15)
BUN: 38 mg/dL — AB (ref 6–20)
CO2: 28 mmol/L (ref 22–32)
Calcium: 8.7 mg/dL — ABNORMAL LOW (ref 8.9–10.3)
Chloride: 96 mmol/L — ABNORMAL LOW (ref 101–111)
Creatinine, Ser: 4.69 mg/dL — ABNORMAL HIGH (ref 0.61–1.24)
GFR calc Af Amer: 13 mL/min — ABNORMAL LOW (ref >60)
GFR, EST NON AFRICAN AMERICAN: 11 mL/min — AB (ref >60)
Glucose, Bld: 199 mg/dL — ABNORMAL HIGH (ref 65–99)
Potassium: 3.7 mmol/L (ref 3.5–5.1)
SODIUM: 133 mmol/L — AB (ref 135–145)

## 2017-02-02 LAB — GLUCOSE, CAPILLARY
GLUCOSE-CAPILLARY: 261 mg/dL — AB (ref 65–99)
Glucose-Capillary: 178 mg/dL — ABNORMAL HIGH (ref 65–99)
Glucose-Capillary: 155 mg/dL — ABNORMAL HIGH (ref 65–99)
Glucose-Capillary: 260 mg/dL — ABNORMAL HIGH (ref 65–99)

## 2017-02-02 LAB — CBC
HCT: 31.9 % — ABNORMAL LOW (ref 39.0–52.0)
Hemoglobin: 10.4 g/dL — ABNORMAL LOW (ref 13.0–17.0)
MCH: 29.8 pg (ref 26.0–34.0)
MCHC: 32.6 g/dL (ref 30.0–36.0)
MCV: 91.4 fL (ref 78.0–100.0)
PLATELETS: 172 10*3/uL (ref 150–400)
RBC: 3.49 MIL/uL — ABNORMAL LOW (ref 4.22–5.81)
RDW: 13.9 % (ref 11.5–15.5)
WBC: 12.3 10*3/uL — ABNORMAL HIGH (ref 4.0–10.5)

## 2017-02-02 LAB — MRSA PCR SCREENING: MRSA BY PCR: NEGATIVE

## 2017-02-02 LAB — PROTIME-INR
INR: 1.4
Prothrombin Time: 17.3 seconds — ABNORMAL HIGH (ref 11.4–15.2)

## 2017-02-02 MED ORDER — TAMSULOSIN HCL 0.4 MG PO CAPS
0.4000 mg | ORAL_CAPSULE | Freq: Every day | ORAL | Status: DC
  Administered 2017-02-02 – 2017-02-03 (×2): 0.4 mg via ORAL
  Filled 2017-02-02 (×2): qty 1

## 2017-02-02 MED ORDER — COUMADIN BOOK
Freq: Once | Status: AC
  Administered 2017-02-02: 1
  Filled 2017-02-02: qty 1

## 2017-02-02 MED ORDER — WARFARIN VIDEO: Freq: Once | Status: DC

## 2017-02-02 MED ORDER — SODIUM CHLORIDE 0.9 % IV BOLUS (SEPSIS): 1000.0000 mL | Freq: Once | INTRAVENOUS | Status: DC

## 2017-02-02 MED ORDER — CARVEDILOL 12.5 MG PO TABS
12.5000 mg | ORAL_TABLET | Freq: Two times a day (BID) | ORAL | Status: DC
  Administered 2017-02-03: 12.5 mg via ORAL
  Filled 2017-02-02: qty 1

## 2017-02-02 MED ORDER — SODIUM CHLORIDE 0.9 % IV SOLN
999.0000 mL/h | INTRAVENOUS | Status: DC
  Administered 2017-02-02: 999 mL/h via INTRAVENOUS

## 2017-02-02 MED ORDER — WARFARIN SODIUM 5 MG PO TABS
2.5000 mg | ORAL_TABLET | Freq: Once | ORAL | Status: AC
  Administered 2017-02-02: 2.5 mg via ORAL
  Filled 2017-02-02: qty 1

## 2017-02-02 NOTE — Progress Notes (Signed)
Called to patient's room by OT.  Patient in BR and was unresponsive, gray, and drooling.  Was attempting to have stool.  Initial systolic B/P in 62'O systolic.  Was dependently lifted to bed with 5 assists from recliner, where he had been transferred from bathroom.  Was oriented at that time to place and year.  Generalized weakness present and he could not bear weight.  Pulse = 93 and pacing.  Pulse ox was in 70s before placing on non-re-breathing mask.  1 liter bolus of saline given.  Patient gradually regained consciousness and neuro symptoms dissipated.  Now A&O, but continues to complain of "feeling rough".  MD to transfer to [place].  Will continue to monitor

## 2017-02-02 NOTE — Progress Notes (Signed)
Subjective:  Noted ealier episode of hypotension/ "unrepsonsive in bathroom siting on Toilet:" SBP 78  cbg 155, NS 500 cc given  Then more alert. Currently in bed no cos , wife/daughter in room.  HD yest 1000 cc uf  With bp stable pre 144/67 and post 159/75  yest.  Objective Vital signs in last 24 hours: Vitals:   02/02/17 0942 02/02/17 0949 02/02/17 0953 02/02/17 1036  BP: (!) 100/54 104/72 (!) 116/51 (!) 101/51  Pulse:      Resp:      Temp:      TempSrc:      SpO2: (!) 77% 97% 94% 100%  Weight:      Height:       Weight change:   Physical Exam: General: alet OX3 NAD elderly WM Heart: RRR No Mrg Lungs: CTA  Abdomen: soft , Nt, Nd Extremities: no pedal edema Dialysis Access: RUA AVF + bruit with ecchymosis /bruising        op Dialysis: [facility] HD time ^'ing w/ each HD (3:30 ordered)  300/800 Odella Aquas)  85.5kg   2/2.25 bath Hep none  AVF RUA(successful declot/PTA Dr. Posey Pronto 01/28/17 as op (initally placed 01/2014 ) - Hectoral 20mcg IV q HD - Venofer 50mg  IV weekly  Problems: 1  CVA, acute - L sided weakness, slurred speech. Speech better , still having gait problems. CIR evaluating 2  Urine retention / hx BPN - foley in place now/ Urology following  3  ESRD recent HD start 2 wks ago; AVF infiltrated but still using 4. Vol looks dry and down 3kg under dry wt w/ hypotensive episode today 5  Anemia of ESRD- HGB  10.4 no esa yet 6  MBD - on calcium acetate  =[place] / hectorol 60mcg / ca 8.7  7  DM2- per admit  8  HO A. Fib / Pacemaker - On coumadin  Plan - no UF next HD, give NS 1 liter today   Ernest Haber, PA-C [facility] [phone] 02/02/2017,12:20 PM  LOS: 1 day   Labs: Basic Metabolic Panel:  Recent Labs Lab 01/31/17 0352 02/01/17 0419 02/02/17 0412  NA 134* 134* 133*  K 3.6 4.2 3.7  CL 96* 97* 96*  CO2 26 26 28   GLUCOSE 167* 154* 199*  BUN 37* 56* 38*  CREATININE 4.82* 6.25* 4.69*  CALCIUM 8.2* 8.8* 8.7*   Liver Function  Tests:  Recent Labs Lab 01/29/17 1139  AST 13*  ALT 15*  ALKPHOS 65  BILITOT 0.5  PROT 5.5*  ALBUMIN 2.9*    Recent Labs Lab 01/29/17 1139 01/31/17 0352 02/01/17 0419 02/02/17 0412  WBC 11.0* 11.5* 13.1* 12.3*  NEUTROABS 9.2*  --   --   --   HGB 9.8* 10.1* 10.7* 10.4*  HCT 30.0* 30.3* 32.8* 31.9*  MCV 91.7 91.3 91.1 91.4  PLT 174 167 195 172   Cardiac Enzymes: No results for input(s): CKTOTAL, CKMB, CKMBINDEX, TROPONINI in the last 168 hours. CBG:  Recent Labs Lab 02/01/17 1126 02/01/17 2123 02/02/17 0625 02/02/17 0942 02/02/17 1159  GLUCAP 268* 208* 178* 155* 260*     Medications: . sodium chloride     . calcium acetate  1,334 mg Oral TID WC  . [START ON 02/03/2017] carvedilol  12.5 mg Oral BID WC  . doxercalciferol  4 mcg Intravenous Q T,Th,Sa-HD  . ezetimibe  10 mg Oral Daily  . feeding supplement (PRO-STAT SUGAR FREE 64)  30 mL Oral BID  . fenofibrate  160  mg Oral Daily  . finasteride  5 mg Oral Daily  . heparin  5,000 Units Subcutaneous Q8H  . insulin aspart  0-5 Units Subcutaneous QHS  . insulin aspart  0-9 Units Subcutaneous TID WC  . ipratropium  2 spray Each Nare Daily  . multivitamin  1 tablet Oral QHS  . tamsulosin  0.4 mg Oral Daily  . Warfarin - Pharmacist Dosing Inpatient   Does not apply [phone]

## 2017-02-02 NOTE — Plan of Care (Signed)
Problem: Education: Goal: Knowledge of disease or condition will improve Outcome: Progressing Patient educated about early signs of stroke and primary prevention. Patient needs reinforcement. Will continue with care Goal: Knowledge of secondary prevention will improve Outcome: Progressing Patient encouraged to adhere to medication regimen. Patient needs reinforcement. Will continue with care.   Problem: Education: Goal: Knowledge of disease or condition will improve Outcome: Progressing Patient educated about s/s of hypo and hyper glycemia. Patient needs reinforcement.   Problem: Metabolic: Goal: Ability to maintain appropriate glucose levels will improve Outcome: Progressing Blood glucose checks done ACHS. Insulin administered per MAR orders.

## 2017-02-02 NOTE — Discharge Instructions (Signed)

## 2017-02-02 NOTE — Progress Notes (Signed)
Occupational Therapy Treatment Patient Details Name: JAYCEION LISENBY MRN: [id number] DOB: [birth year] Today's Date: 02/02/2017    History of present illness Pt is 81 y/o male admitted secondary to suspected TIA. Pt with dysarthria, facial droop , syncopal episode and weakness. PMH includes a fib, CKD on dialysis, HTN, DM with neuropathy, and s/p pacemaker placement and AV fistula placement.    OT comments  Pt. Seen for skilled OT with initial gains with sitting balance, completion of UB/LB bathing and toileting tasks.  States LUE feels like it has more functional use. At end of session in b.room pt. Became unresponsive after stating "i feel rough" and sitting down.  rn staff and rapid notified.  Assisted back to bed.   Follow Up Recommendations  CIR;Supervision/Assistance - 24 hour    Equipment Recommendations       Recommendations for Other Services Rehab consult    Precautions / Restrictions Precautions Precautions: Fall       Mobility Bed Mobility Overal bed mobility: Needs Assistance Bed Mobility: Rolling;Sidelying to Sit Rolling: Min guard Sidelying to sit: Min assist       General bed mobility comments: hob minimally elevated and pt. able to roll and use B UE support on bed rail and sit eob with intermittent use of LUE on bed rail for support.   Transfers Overall transfer level: Needs assistance Equipment used: Rolling walker (2 wheeled) Transfers: Sit to/from [facility] Sit to Stand: Mod assist Stand pivot transfers: Mod assist            Balance                                           ADL either performed or assessed with clinical judgement   ADL Overall ADL's : Needs assistance/impaired     Grooming: Wash/dry hands;Wash/dry face;Set up;Sitting   Upper Body Bathing: Set up;Sitting   Lower Body Bathing: Moderate assistance;Sit to/from stand Lower Body Bathing Details (indicate cue type and reason): is right hand  dominant but states he always uses L and to wipe/wash buttocks.  able to perfrom in standing with mod. assistance for for balance and walker management but was able to bring LUE and reach desired area for washing Upper Body Dressing : Min guard;Sitting       Toilet Transfer: Moderate assistance;Cueing for sequencing;Ambulation;BSC;Comfort height toilet;Grab bars;RW Toilet Transfer Details (indicate cue type and reason): recliner placed outside of doorway and pt. able to amb. and pivot approx. 2 steps with mod a and cueing to complete turn and reach for grab bar Toileting- Clothing Manipulation and Hygiene: Moderate assistance;Sit to/from stand       Functional mobility during ADLs: Moderate assistance;Rolling walker General ADL Comments: noted improvement from previous session. pt. was a one person assist today and was able to complete ub/lb adl including toileting.  states he feels a lot more ability with use of LUE.  after 2nd attempt to have a BM pt. states he is ready when i entered the b.room he was found standing holding a wash cloth and RW.  i said "remember you cant stand without me", he seemed less alert and slowly reached for arm rest and sat down stating "i feel rough".  he started to lean against the wall and would not answer questions.  rn alerted and several rns arrived and then rapid was called.  assisted pt. from  toilet to recliner to bed.  with total A of multiple staff members.  left skin tear noted around wrist.  rn notifed.       Vision       Perception     Praxis      Cognition Arousal/Alertness: Awake/alert Behavior During Therapy: WFL for tasks assessed/performed Overall Cognitive Status: Within Functional Limits for tasks assessed                                 General Comments: initially fine then episode requiring rapic response        Exercises     Shoulder Instructions       General Comments      Pertinent Vitals/ Pain       Pain  Assessment: No/denies pain  Home Living                                          Prior Functioning/Environment              Frequency  Min 3X/week        Progress Toward Goals  OT Goals(current goals can now be found in the care plan section)  Progress towards OT goals: Progressing toward goals     Plan Discharge plan remains appropriate    Co-evaluation                 AM-PAC PT "6 Clicks" Daily Activity     Outcome Measure   Help from another person eating meals?: None Help from another person taking care of personal grooming?: A Little Help from another person toileting, which includes using toliet, bedpan, or urinal?: A Little Help from another person bathing (including washing, rinsing, drying)?: A Little Help from another person to put on and taking off regular upper body clothing?: A Little Help from another person to put on and taking off regular lower body clothing?: A Lot 6 Click Score: 18    End of Session Equipment Utilized During Treatment: Gait belt;Rolling walker  OT Visit Diagnosis: Other abnormalities of gait and mobility (R26.89);Ataxia, unspecified (R27.0);Unsteadiness on feet (R26.81);Muscle weakness (generalized) (M62.81)   Activity Tolerance Patient tolerated treatment well;Other (comment) (Initially okay, but had episode at end of session )   Patient Left in bed;Other (comment) (left with multiple nurses and rapid repsonse team)   Nurse Communication Other (comment) (alerted nursing staff of pts. change of condition in the b.room)        Time: 0076-2263 OT Time Calculation (min): 41 min  Charges: OT General Charges $OT Visit: 1 Procedure OT Treatments $Self Care/Home Management : 38-52 mins   [name] Coffin, COTA/L 02/02/2017, 9:49 AM

## 2017-02-02 NOTE — Progress Notes (Signed)
[facility] PROGRESS NOTE  James David ZSW:[id number] DOB: 24-Mar-1936 DOA: 01/29/2017  PCP: [name], MD  Brief History/Interval Summary: 81 year old male with a past medical history of atrial flutter, followed by cardiology, but not on anticoagulation due to low burden, history of pacemaker placement, history of chronic kidney disease, who started dialysis recently, diabetes mellitus, hypertension, presented with sudden onset of diaphoresis, dizziness, left arm weakness and slurred speech. Concern was for TIA versus stroke. Patient was hospitalized for further management.  Reason for Visit: Acute stroke  Consultants: Neurology. Nephrology  Procedures:  Transthoracic echocardiogram Study Conclusions  - Left ventricle: The cavity size was normal. Wall thickness was   increased in a pattern of mild LVH. Systolic function was   vigorous. The estimated ejection fraction was in the range of 65%   to 70%. There is hypokinesis of the apical myocardium. Doppler   parameters are consistent with abnormal left ventricular   relaxation (grade 1 diastolic dysfunction). - Aortic valve: There was trivial regurgitation. - Mitral valve: Calcified annulus.  Impressions:  - Mild apical hypokinesis likely related to pacing; overall   vigorous LV systolic function; mild diastolic dysfunction; mild   LVH; trace AI.  Carotid Doppler Bilateral:  1-39% ICA stenosis.  Vertebral artery flow is antegrade.    Antibiotics: None  Subjective/Interval History: Patient complains of feeling sore all over. Denies any chest pain, shortness of breath. No nausea, vomiting. Speech is normal. Denies any weakness in his arms.  ROS: No chest pain or shortness of breath  Objective:  Vital Signs  Vitals:   02/01/17 1935 02/01/17 2010 02/02/17 0155 02/02/17 0628  BP: (!) 154/62 (!) 159/75 115/61 (!) 176/58  Pulse: 96 75 94 87  Resp: (!) 22 20 20 20   Temp: 98.4 F (36.9 C) 98.9 F (37.2  C) 98.2 F (36.8 C) 98.6 F (37 C)  TempSrc: Oral Oral Oral Oral  SpO2: 96% 100% 100% 100%  Weight: 81.8 kg (180 lb 5.4 oz)     Height:        Intake/Output Summary (Last 24 hours) at 02/02/17 0842 Last data filed at 02/02/17 0634  Gross per 24 hour  Intake              180 ml  Output             1700 ml  Net            -1520 ml   Filed Weights   01/30/17 2255 02/01/17 1457 02/01/17 1935  Weight: 84.8 kg (186 lb 15.2 oz) 82.8 kg (182 lb 8.7 oz) 81.8 kg (180 lb 5.4 oz)    General appearance: Awake and alert. No distress. Resp: Clear to auscultation bilaterally Cardio: S1, S2 is normal, regular. No stress. 4. No rubs, murmurs, shortness GI: Abdomen is soft. Nontender, nondistended. Bowel sounds are present. No masses or megaly. Foley catheter noted. Less bleeding noted compared to yesterday. Extremities: No pedal edema Neurologic: Oriented 3. Speech is normal. Left-sided facial droop has improved. Tongue is midline. Strength is normal in bilateral upper and lower extremities.   Lab Results:  Data Reviewed: I have personally reviewed following labs and imaging studies  CBC:  Recent Labs Lab 01/29/17 1139 01/31/17 0352 02/01/17 0419 02/02/17 0412  WBC 11.0* 11.5* 13.1* 12.3*  NEUTROABS 9.2*  --   --   --   HGB 9.8* 10.1* 10.7* 10.4*  HCT 30.0* 30.3* 32.8* 31.9*  MCV 91.7 91.3 91.1 91.4  PLT 174  167 195 387    Basic Metabolic Panel:  Recent Labs Lab 01/29/17 1139 01/31/17 0352 02/01/17 0419 02/02/17 0412  NA 134* 134* 134* 133*  K 4.4 3.6 4.2 3.7  CL 101 96* 97* 96*  CO2 21* 26 26 28   GLUCOSE 234* 167* 154* 199*  BUN 72* 37* 56* 38*  CREATININE 6.82* 4.82* 6.25* 4.69*  CALCIUM 8.6* 8.2* 8.8* 8.7*    GFR: Estimated Creatinine Clearance: 13.4 mL/min (A) (by C-G formula based on SCr of 4.69 mg/dL (H)).  Liver Function Tests:  Recent Labs Lab 01/29/17 1139  AST 13*  ALT 15*  ALKPHOS 65  BILITOT 0.5  PROT 5.5*  ALBUMIN 2.9*     Coagulation  Profile:  Recent Labs Lab 01/29/17 1139 02/01/17 0419 02/02/17 0412  INR 1.02 1.03 1.40    CBG:  Recent Labs Lab 01/31/17 2056 02/01/17 0619 02/01/17 1126 02/01/17 2123 02/02/17 0625  GLUCAP 137* 153* 268* 208* 178*    Radiology Studies: Ct Abdomen Pelvis Wo Contrast  Result Date: 02/01/2017 CLINICAL DATA:  Gross hematuria EXAM: CT ABDOMEN AND PELVIS WITHOUT CONTRAST TECHNIQUE: Multidetector CT imaging of the abdomen and pelvis was performed following the standard protocol without IV contrast. COMPARISON:  None. FINDINGS: Lower chest: Right atrial and right ventricular leads are noted. Normal size cardiac chambers. Trace pericardial effusion or thickening. Clear lung bases with minimal atelectasis on the left. Hepatobiliary: Colonic interposition is noted between the liver and ventral abdominal wall. Unremarkable noncontrast appearance of the liver. No gallstones are present. Gallbladder is nondistended and nonthickened. No biliary dilatation is noted. Pancreas: Normal Spleen: Normal Adrenals/Urinary Tract: Normal bilateral adrenal glands. Atrophic appearance of the right kidney with cortical thinning and scarring of the left kidney. Bilateral water attenuating renal cysts are noted on the right measuring 1.7 cm off the anterior interpolar aspect and on the left in the lower pole measuring 3 cm. No nephrolithiasis no obstructive uropathy is seen. The bladder is markedly thickened in appearance and contracted with Foley catheter in place. Stomach/Bowel: Contracted stomach. Normal small and large bowel. Normal appendix. No inflammation or obstruction. Vascular/Lymphatic: Aortoiliac atherosclerosis without aneurysm. No pathologically enlarged lymph nodes. Reproductive: Enlarged prostate measuring approximately 5.9 x 4.7 x 4.2 cm. There are 2 tiny calcifications along the anterior aspect of the Foley catheter along the prostatic urethra. Urinary calculi along the prostatic urethra could have  this appearance although these could also represent central zone calcifications within the prostate gland outside but adjacent to the urethra, sagittal series 7, image 108 and series 3, image 78. Other: None Musculoskeletal: Degenerative changes are seen along the dorsal spine. No acute nor suspicious osseous lesions. IMPRESSION: 1. Two punctate calcifications are seen along the anterior aspect of the Foley catheter along the expected course of the prostatic urethra. These may represent urethral stones or potentially with central zone calcifications within the prostate. 2. Atrophy of the right kidney with bilateral renal cortical scarring and cysts as above. No obstructive uropathy. 3. Marked thickening of the bladder decompressed by Foley catheter is noted. Chronic cystitis may have this appearance. A hemorrhagic cystitis might account for the gross hematuria clinically noted. 4. Aortic atherosclerosis. Electronically Signed   By: Ashley Royalty M.D.   On: 02/01/2017 20:21     Medications:  Scheduled: . calcium acetate  1,334 mg Oral TID WC  . carvedilol  12.5 mg Oral BID WC  . doxercalciferol  4 mcg Intravenous Q T,Th,Sa-HD  . ezetimibe  10 mg Oral Daily  .  feeding supplement (PRO-STAT SUGAR FREE 64)  30 mL Oral BID  . fenofibrate  160 mg Oral Daily  . finasteride  5 mg Oral Daily  . heparin  5,000 Units Subcutaneous Q8H  . insulin aspart  0-5 Units Subcutaneous QHS  . insulin aspart  0-9 Units Subcutaneous TID WC  . ipratropium  2 spray Each Nare Daily  . multivitamin  1 tablet Oral QHS  . tamsulosin  0.4 mg Oral Daily  . Warfarin - Pharmacist Dosing Inpatient   Does not apply q1800   Continuous:  OVF:IEPPIRJJOACZY **OR** acetaminophen (TYLENOL) oral liquid 160 mg/5 mL **OR** acetaminophen, ondansetron (ZOFRAN) IV, polyvinyl alcohol  Assessment/Plan:  Principal Problem:   TIA (transient ischemic attack) Active Problems:   Hypertension   CKD (chronic kidney disease) stage V requiring  chronic dialysis (HCC)   DDD (degenerative disc disease)   HLD (hyperlipidemia)   Second degree heart block   Atrial flutter (HCC)   ESRD (end stage renal disease) on dialysis ([place])   Stroke (cerebrum) ([name])    Suspected Acute stroke Due to presence of pacemaker, patient could not undergo MRI. CT head was repeated which also did not show any acute process. Patient's speech has improved. Carotid Dopplers without any significant stenosis. Echocardiogram is as above. Seen by neurology. They recommended anticoagulation due to history of atrial flutter. Warfarin was initiated. Plavix was continued for bridging purposes. However, in view of the hematuria, discussed with neurology. Okay to stop Plavix. Since his bleeding has subsided, okay to continue warfarin. LDL is 99. He is allergic to statin. He is on Zetia and fenofibrate. PT and OT evaluation. Inpatient rehabilitation was recommended.   Hematuria/Urinary Retention No infection noted on UA. Patient was found to have urinary retention. Foley catheter had to be placed. Subsequently, he developed hematuria. Urology was consulted. Continue finasteride and tamsulosin. Bladder irrigation was performed. Bleeding appears to have subsided and possibly due to prostate. Recommendation is to continue anticoagulation. Leave Foley catheter in and follow-up with urology in 1 week.   End-stage renal disease on hemodialysis. Started dialysis recently. He is on Tuesday, Thursday, Saturday schedule. Nephrology is following.  History of essential hypertension. Initial permissive hypertension. Carvedilol was resumed.  Anemia of chronic kidney disease. Stable.  History of atrial flutter. Has not been on anticoagulation as his burden of atrial flutter was low. Now with his stroke/TIA, he will need to be on anticoagulation as his Chads2vasc score is at least 6. This has been discussed with the patient by the neurologist. Due to end-stage renal disease cannot use  any of the direct oral anticoagulants. Warfarin was initiated.  History of pacemaker placement This was placed in 2015 for Mobitz type II. Followed by electrophysiologist.  Hyperlipidemia. Reports intolerance to statin medications. He is on Zetia and fenofibrate. LDL is 99.  Diabetes mellitus type 2. Patient noted to be on oral medications at home. Continue to monitor CBGs. Holding Amaryl. SSI. HbA1c is 7.0.  ADDENDUM While working with occupational therapy, patient became unresponsive. Apparently, patient was attempting to have a bowel movement. He sat down in the chair and had a syncopal episode. Initial blood pressure was in the 60Y systolic. Patient was lifted up to bed. He regained consciousness quickly. No focal neurological deficits on examination. Repeat blood pressure showed improvement. Patient was given fluid bolus. We will hold carvedilol for the rest of the day today. Patient was initially also placed on a nonrebreather mask. Oxygenation has improved. Cut back on oxygen. Unfortunately, patient was  off of telemetry when this event occurred. Currently in a paced rhythm as before. Denies any chest pain, shortness of breath. Will also repeat CT head. This was likely a vasovagal episode. Continue to monitor closely in stepdown setting for now. Nephrology was also notified.  DVT Prophylaxis: Subcutaneous heparin    Code Status: Full code  Family Communication: Discussed with the patient Disposition Plan: Management as outlined above. PT and OT continue to recommend CIR.    LOS: 1 day   [facility] Pager [phone] 02/02/2017, 8:42 AM  If 7PM-7AM, please contact night-coverage at [email or web address], password [facility]

## 2017-02-02 NOTE — Significant Event (Signed)
Rapid Response Event Note  Overview:  Called by Rn for unresponsiveness Time Called: 0930 Arrival Time: 0934 Event Type: Hypotension  Initial Focused Assessment:  Called by RN for patient unresponsive in the bathroom.  On my arrival to patients room, patient in bathroom sitting on toliet, minimally responsive.  Placed on NRB mask. RN and therapy at bedside.  As per therapy, was assisted to bathroom called for help and then went suddenly unresponsive.  BP checked SBP 78.  CBG 155.  Patient lifted to chair then to bed, placed in trendelenburg, NS 500cc bolus started.  Once in bed patient became more aroused, alert oriented.  Denies CP, SOB or dizziness.  States he got dizzy in the bathroom.  Skin was cool and slightly dusky.    Interventions:  MD at bedside.  BP rechecked 100/54, HR 100, unable to obtain pulse ox.    Plan of Care (if not transferred):  Patient to transfer to SDU for closer monitoring  Event Summary:  RN to call if assistance needed   at      at          [facility], Harlin Rain

## 2017-02-02 NOTE — Progress Notes (Signed)
ANTICOAGULATION CONSULT NOTE - FOLLOW UP  Pharmacy Consult:  Coumadin Indication: atrial fibrillation in setting of acute stroke/TIA   Allergies  Allergen Reactions  . Amlodipine Besylate Swelling    angioedema  . Lisinopril Other (See Comments)    Renal insufficiency  . Statins Other (See Comments)    Muscle aches    Patient Measurements: Height: 5\' 10"  (177.8 cm) Weight: 180 lb 5.4 oz (81.8 kg) IBW/kg (Calculated) : 73  Vital Signs: Temp: 98.6 F (37 C) (05/20 0628) Temp Source: Oral (05/20 0628) BP: 101/51 (05/20 1036) Pulse Rate: 87 (05/20 0628)  Labs:  Recent Labs  01/31/17 0352 02/01/17 0419 02/02/17 0412  HGB 10.1* 10.7* 10.4*  HCT 30.3* 32.8* 31.9*  PLT 167 195 172  LABPROT  --  13.5 17.3*  INR  --  1.03 1.40  CREATININE 4.82* 6.25* 4.69*    Estimated Creatinine Clearance: 13.4 mL/min (A) (by C-G formula based on SCr of 4.69 mg/dL (H)).    Assessment: 54 YOM with history of Aflutter.  He has not been on anticoagulation due to low risk for stroke.  Here with acute stroke/TIA and Pharmacy consulted on 01/31/17 to manage Coumadin.   --Patient's INR is 1.4 today, sub-therapeutic as expected after only 2 days of coumadin but INR has increased toward goal.  Hgb low/stable and pltc is wnl.  MD also has patient on SQ heparin until INR therapeutic.  Urologist consulted 5/19 for new gross hematuria, noting that hematuria coming from enalarged prostate in setting of minor catheter trauma and ongonig anticoagulation. Amt of bleeding is not impressive per Dr. Tresa Moore.  I disussed with RN today 5/20 who reports pt still with pink urine and that Dr. [place] Pink has seen pt today, no changes made in anticoagulation.    CHA2DS2-VASc Score = at least 6  Neuro notes on 5/18: Due to aflutter and stroke symptoms, recommend coumadin for stroke prevention. OK with plavix bridge, once INR 2-3, plavix can be discontinued. And Heparin 5000 units sq tid for VTE prophylaxis- until INR  therpapeutic.    Goal of Therapy:  INR 2-3   Plan:  - Repeat Coumadin 2.5mg  PO today - Heparin 5000 units SQ Q8H per MD.  D/C when INR therapeutic - Daily PT / INR   Nicole Cella, RPh Clinical Pharmacist Pager: [phone] 02/02/2017, 12:36 PM

## 2017-02-02 NOTE — Progress Notes (Signed)
Patient back from dialysis, made comfortable in bed, VSS, no complain of pain or discomfort voiced. Bed in lowest position, call light within reach. Will continue to monitor.

## 2017-02-03 ENCOUNTER — Encounter (HOSPITAL_COMMUNITY): Payer: Self-pay | Admitting: Physician Assistant

## 2017-02-03 DIAGNOSIS — R55 Syncope and collapse: Secondary | ICD-10-CM

## 2017-02-03 DIAGNOSIS — I951 Orthostatic hypotension: Secondary | ICD-10-CM

## 2017-02-03 LAB — BASIC METABOLIC PANEL
Anion gap: 11 (ref 5–15)
BUN: 58 mg/dL — ABNORMAL HIGH (ref 6–20)
CO2: 27 mmol/L (ref 22–32)
CREATININE: 6.12 mg/dL — AB (ref 0.61–1.24)
Calcium: 8.5 mg/dL — ABNORMAL LOW (ref 8.9–10.3)
Chloride: 99 mmol/L — ABNORMAL LOW (ref 101–111)
GFR, EST AFRICAN AMERICAN: 9 mL/min — AB (ref >60)
GFR, EST NON AFRICAN AMERICAN: 8 mL/min — AB (ref >60)
Glucose, Bld: 154 mg/dL — ABNORMAL HIGH (ref 65–99)
POTASSIUM: 3.7 mmol/L (ref 3.5–5.1)
SODIUM: 137 mmol/L (ref 135–145)

## 2017-02-03 LAB — CBC
HCT: 30.9 % — ABNORMAL LOW (ref 39.0–52.0)
Hemoglobin: 10 g/dL — ABNORMAL LOW (ref 13.0–17.0)
MCH: 29.9 pg (ref 26.0–34.0)
MCHC: 32.4 g/dL (ref 30.0–36.0)
MCV: 92.2 fL (ref 78.0–100.0)
Platelets: 173 10*3/uL (ref 150–400)
RBC: 3.35 MIL/uL — AB (ref 4.22–5.81)
RDW: 13.9 % (ref 11.5–15.5)
WBC: 12.3 10*3/uL — ABNORMAL HIGH (ref 4.0–10.5)

## 2017-02-03 LAB — GLUCOSE, CAPILLARY
GLUCOSE-CAPILLARY: 240 mg/dL — AB (ref 65–99)
GLUCOSE-CAPILLARY: 228 mg/dL — AB (ref 65–99)
Glucose-Capillary: 204 mg/dL — ABNORMAL HIGH (ref 65–99)
Glucose-Capillary: 193 mg/dL — ABNORMAL HIGH (ref 65–99)
Glucose-Capillary: 235 mg/dL — ABNORMAL HIGH (ref 65–99)

## 2017-02-03 LAB — PROTIME-INR
INR: 2.26
PROTHROMBIN TIME: 25.3 s — AB (ref 11.4–15.2)

## 2017-02-03 MED ORDER — CAMPHOR-MENTHOL 0.5-0.5 % EX LOTN
TOPICAL_LOTION | CUTANEOUS | Status: DC | PRN
  Administered 2017-02-04: 1 via TOPICAL

## 2017-02-03 MED ORDER — SODIUM CHLORIDE 0.9 % IV BOLUS (SEPSIS)
500.0000 mL | Freq: Once | INTRAVENOUS | Status: AC
  Administered 2017-02-03: 500 mL via INTRAVENOUS

## 2017-02-03 MED ORDER — ALFUZOSIN HCL ER 10 MG PO TB24
10.0000 mg | ORAL_TABLET | Freq: Every day | ORAL | Status: DC
  Administered 2017-02-04 – 2017-02-06 (×3): 10 mg via ORAL
  Filled 2017-02-03 (×3): qty 1

## 2017-02-03 MED ORDER — CARVEDILOL 6.25 MG PO TABS: 6.2500 mg | ORAL_TABLET | Freq: Two times a day (BID) | ORAL | Status: DC

## 2017-02-03 NOTE — Progress Notes (Signed)
ANTICOAGULATION CONSULT NOTE - FOLLOW UP  Pharmacy Consult:  Coumadin Indication: atrial fibrillation in setting of acute stroke/TIA   Allergies  Allergen Reactions  . Amlodipine Besylate Swelling    angioedema  . Lisinopril Other (See Comments)    Renal insufficiency  . Statins Other (See Comments)    Muscle aches    Patient Measurements: Height: 5\' 10"  (177.8 cm) Weight: 180 lb 12.4 oz (82 kg) IBW/kg (Calculated) : 73  Vital Signs: Temp: 98 F (36.7 C) (05/21 0833) Temp Source: Oral (05/21 0833) BP: 144/85 (05/21 0833) Pulse Rate: 104 (05/21 0833)  Labs:  Recent Labs  02/01/17 0419 02/02/17 0412 02/03/17 0227  HGB 10.7* 10.4* 10.0*  HCT 32.8* 31.9* 30.9*  PLT 195 172 173  LABPROT 13.5 17.3* 25.3*  INR 1.03 1.40 2.26  CREATININE 6.25* 4.69* 6.12*    Estimated Creatinine Clearance: 10.3 mL/min (A) (by C-G formula based on SCr of 6.12 mg/dL (H)).    Assessment: 75 YOM with history of Aflutter.  He has not been on anticoagulation due to low risk for stroke.  Here with acute stroke/TIA and Pharmacy consulted on 01/31/17 to manage Coumadin.    INR  = 2.26 (quick trend up)  Goal of Therapy:  INR 2-3   Plan:  No warfarin today DC sq heparin Daily INR  Thank you Anette Guarneri, PharmD [phone] -02/03/2017, 10:22 AM

## 2017-02-03 NOTE — Progress Notes (Signed)
Physical Therapy Treatment Patient Details Name: James David MRN: [id number] DOB: 01/02/1936 Today's Date: 02/03/2017    History of Present Illness Pt is 81 y/o male admitted secondary to suspected TIA. Pt with dysarthria, facial droop , syncopal episode and weakness. PMH includes a fib, CKD on dialysis, HTN, DM with neuropathy, and s/p pacemaker placement and AV fistula placement.     PT Comments    Patient progressing with distance with ambulation and much improved from AM session with OT, but orthostatic per BP measures this session. Continue to feel patient is appropriate for CIR level rehab at d/c.   Follow Up Recommendations  CIR;Supervision/Assistance - 24 hour     Equipment Recommendations  None recommended by PT    Recommendations for Other Services       Precautions / Restrictions Precautions Precautions: Fall Precaution Comments: watch BP    Mobility  Bed Mobility Overal bed mobility: Needs Assistance Bed Mobility: Supine to Sit;Sit to Supine     Supine to sit: Min assist Sit to supine: Min assist   General bed mobility comments: assist to lift trunk as pt needing to limit R UE use after fistula placement; assist into bed for positioning near [facility] (stand step) and for legs into bed  Transfers Overall transfer level: Needs assistance Equipment used: Rolling walker (2 wheeled) Transfers: Sit to/from Stand Sit to Stand: Mod assist         General transfer comment: lifting assist from EOB and pt pulling up on RW, cues for hand placement stand to sit  Ambulation/Gait Ambulation/Gait assistance: Min assist Ambulation Distance (Feet): 125 Feet Assistive device: Rolling walker (2 wheeled) Gait Pattern/deviations: Step-through pattern;Decreased stride length;Trunk flexed     General Gait Details: weakness evident throughout, though no overt LOB; unable to turn head or take any challenges   Stairs            Wheelchair Mobility    Modified  [place] (Stroke Patients Only)       Balance Overall balance assessment: Needs assistance Sitting-balance support: Bilateral upper extremity supported Sitting balance-Leahy Scale: Poor Sitting balance - Comments: leaning posteriorly sitting on EOB Postural control: Posterior lean Standing balance support: Bilateral upper extremity supported Standing balance-Leahy Scale: Poor Standing balance comment: UE support needed for balance                            Cognition Arousal/Alertness: Awake/alert Behavior During Therapy: WFL for tasks assessed/performed Overall Cognitive Status: Within Functional Limits for tasks assessed                                        Exercises      General Comments        Pertinent Vitals/Pain Faces Pain Scale: No hurt   Orthostatic VS for the past 24 hrs (Last 3 readings):  BP- Lying Pulse- Lying BP- Sitting Pulse- Sitting BP- Standing at 0 minutes Pulse- Standing at 0 minutes BP- Standing at 3 minutes Pulse- Standing at 3 minutes  02/03/17 1300 146/61 92 132/68 105 103/68 102 102/68 106      Home Living                      Prior Function            PT Goals (current goals can now be found in  the care plan section) Progress towards PT goals: Progressing toward goals    Frequency    Min 3X/week      PT Plan Current plan remains appropriate    Co-evaluation              AM-PAC PT "6 Clicks" Daily Activity  Outcome Measure  Difficulty turning over in bed (including adjusting bedclothes, sheets and blankets)?: A Little Difficulty moving from lying on back to sitting on the side of the bed? : Total Difficulty sitting down on and standing up from a chair with arms (e.g., wheelchair, bedside commode, etc,.)?: Total Help needed moving to and from a bed to chair (including a wheelchair)?: A Lot Help needed walking in hospital room?: A Little Help needed climbing 3-5 steps with a railing? : A  Lot 6 Click Score: 12    End of Session Equipment Utilized During Treatment: Gait belt Activity Tolerance: Patient tolerated treatment well Patient left: in bed;with call bell/phone within reach;with family/visitor present   PT Visit Diagnosis: Other abnormalities of gait and mobility (R26.89);Muscle weakness (generalized) (M62.81)     Time: 0388-8280 PT Time Calculation (min) (ACUTE ONLY): 24 min  Charges:  $Gait Training: 23-37 mins                    G CodesMagda [place], [place] 02/03/2017    Reginia Naas 02/03/2017, 3:47 PM  Magda [place], [place] 02/03/2017

## 2017-02-03 NOTE — Consult Note (Signed)
Cardiology Consult    Patient ID: James David MRN: [id number], DOB/AGE: [birth year]   Admit date: 01/29/2017 Date of Consult: 02/03/2017  Primary Physician: [name], MD Primary Cardiologist: Dr. Tamala Julian Requesting Provider: Dr. Maryland Pink  Reason for Consult: orthostatic hypotension and syncope   Patient Profile    James David 81 yo with a PMH of second degree heart block s/p St Jude dual chamber PPM in place (01/20/14), atrial fibrillation/atypical flutter, chronic diastolic heart failure, HTN, HLD, DM, hx of TIA/stroke, and ESRD on HD. He presented to [facility] via EMS after an episode of sudden diaphoresis, dizziness with slurred speech and pre-syncope. Head CT without acute abnormalities, thought to be a TIA episode.  James David is a 81 y.o. male who is being seen today for the evaluation of orthostatic hypotension and syncope at the request of Dr. Maryland Pink..   Past Medical History   Past Medical History:  Diagnosis Date  . Atrial fibrillation ([place])   . Chronic kidney disease, stage IV (severe) (HCC)    baseline creatinine 2-3  . DDD (degenerative disc disease)   . Diabetes mellitus without complication ([place])   . Hyperlipidemia   . Hypertension   . Melanoma ([place]) 2010   removed at [facility]  . Second degree heart block    a. s/p STJ dual chamber PPM - Dr [name]    Past Surgical History:  Procedure Laterality Date  . AV FISTULA PLACEMENT Right 05/31/2014   Procedure: Right Arm Brachiocephalic ARTERIOVENOUS FISTULA CREATION  ;  Surgeon: Conrad Greeley Center, MD;  Location: [place];  Service: Vascular;  Laterality: Right;  . CATARACT EXTRACTION W/ INTRAOCULAR LENS  IMPLANT, BILATERAL    . COLONOSCOPY    . EXCISION MELANOMA WITH SENTINEL LYMPH NODE BIOPSY Right 02/08/2016   Procedure: WIDE EXCISION RIGHT SHOULDER MELANOMA WITH RIGHT SENTINEL LYMPH NODE BIOPSY;  Surgeon: [name], MD;  Location: [place];  Service: General;  Laterality: Right;  . LAMINECTOMY    . PERMANENT PACEMAKER  INSERTION N/A 01/20/2014   STJ Assurity dual chamber pacemaker implanted by Dr [name] for 2nd degree AV block     Allergies  Allergies  Allergen Reactions  . Amlodipine Besylate Swelling    angioedema  . Lisinopril Other (See Comments)    Renal insufficiency  . Statins Other (See Comments)    Muscle aches    History of Present Illness    James David has a complex cardiac history and is well-known to this service. He last saw Dr. Tamala Julian in clinic on 05/23/16. At that time, he was in his usual state of health and his lasix regimen was recently decreased. He was euvolemic on exam at that time. He has a history of afib/atypical flutter and second degree heart block s/p dual chamber PPM (2015). He is not on anticoagulation as his afib burden was noted as "low."   He was brought to [facility] with possible TIA, near syncope, dizziness, and slurred speech. Neurology started coumadin, plavix was continued. However, he developed hematuria and plavix was stopped. Coumadin was continued. On 02/02/17 he had another bout of near syncope with dizziness while in the bathroom trying to have a BM. He was found minimally responsive with hypotensive pressure. He was helped to bed and given 500 cc bolus with improvement in alertness and BP. Cardiology asked to evaluate for near syncope and dizziness.   On my interview, he has had two episodes of near syncope, yesterday and today. Both occurred when he went  to the bathroom. Orthostatic pressures collected today with a significant decrease in pressure with standing. He has recently started HD and has only had total of 3 sessions without medication adjustements. PPM interrogated today and is working properly. He denies chest pain, palpitations, SOB, and N/V.  Inpatient Medications    . calcium acetate  1,334 mg Oral TID WC  . [START ON 02/04/2017] carvedilol  6.25 mg Oral BID WC  . doxercalciferol  4 mcg Intravenous Q T,Th,Sa-HD  . ezetimibe  10 mg Oral Daily  . feeding  supplement (PRO-STAT SUGAR FREE 64)  30 mL Oral BID  . fenofibrate  160 mg Oral Daily  . finasteride  5 mg Oral Daily  . insulin aspart  0-5 Units Subcutaneous QHS  . insulin aspart  0-9 Units Subcutaneous TID WC  . ipratropium  2 spray Each Nare Daily  . multivitamin  1 tablet Oral QHS  . tamsulosin  0.4 mg Oral Daily  . warfarin   Does not apply Once  . Warfarin - Pharmacist Dosing Inpatient   Does not apply q1800     Outpatient Medications    Prior to Admission medications   Medication Sig Start Date End Date Taking? Authorizing Provider  ACCU-CHEK AVIVA PLUS test strip  02/08/14  Yes [provider]  acetaminophen (TYLENOL) 500 MG tablet Take 500 mg by mouth daily as needed for mild pain. (pain/headaches)   Yes [provider]  aspirin EC 81 MG tablet Take 81 mg by mouth at bedtime.   Yes [provider]  calcium acetate (PHOSLO) 667 MG capsule Take 2 capsules by mouth 3 (three) times daily. 01/22/17  Yes [provider]  carvedilol (COREG) 25 MG tablet Take 1 tablet (25 mg total) by mouth 2 (two) times daily with a meal. 11/23/15  Yes Belva Crome, MD  ezetimibe (ZETIA) 10 MG tablet Take 10 mg by mouth daily. 12/11/16  Yes [provider]  fenofibrate 160 MG tablet Take 160 mg by mouth daily.   Yes [provider]  finasteride (PROSCAR) 5 MG tablet Take 5 mg by mouth daily. 04/29/16  Yes [provider]  furosemide (LASIX) 80 MG tablet Take 40 mg by mouth daily.    Yes [provider]  glimepiride (AMARYL) 4 MG tablet Take 4 mg by mouth 2 (two) times daily.    Yes [provider]  Hypromellose (ARTIFICIAL TEARS OP) Place 1 drop into both eyes daily as needed (supplement).   Yes [provider]  ipratropium (ATROVENT) 0.03 % nasal spray Place 2 sprays into both nostrils daily.    Yes [provider]  tamsulosin (FLOMAX) 0.4 MG CAPS capsule Take 0.4 mg by mouth 3 (three) times a week. Monday,  Wednesday and Friday (at bedtime)   Yes [provider]     Family History    Family History  Problem Relation Age of Onset  . Pulmonary embolism Mother   . Diabetes Mother   . Heart attack Father   . Diabetes Brother     Social History    Social History   Social History  . Marital status: Married    Spouse name: N/A  . Number of children: N/A  . Years of education: N/A   Occupational History  . Not on file.   Social History Main Topics  . Smoking status: Never Smoker  . Smokeless tobacco: Never Used  . Alcohol use No  . Drug use: No  . Sexual activity: Not  on file   Other Topics Concern  . Not on file   Social History Narrative   Lives in [place] with spouse.  Retired Theatre manager for [employer].     Review of Systems    General:  No chills, fever, night sweats or weight changes.  Cardiovascular:  No chest pain, dyspnea on exertion, edema, orthopnea, palpitations, paroxysmal nocturnal dyspnea. Dermatological: No rash, lesions/masses Respiratory: No cough, dyspnea Urologic: No hematuria, dysuria Abdominal:   No nausea, vomiting, diarrhea, bright red blood per rectum, melena, or hematemesis Neurologic:  No visual changes, wkns, changes in mental status. All other systems reviewed and are otherwise negative except as noted above.  Physical Exam    Blood pressure 126/61, pulse 88, temperature 98.5 F (36.9 C), temperature source Oral, resp. rate 12, height 5\' 10"  (1.778 m), weight 180 lb 12.4 oz (82 kg), SpO2 100 %.  General: Pleasant, NAD Psych: Normal affect. Neuro: Alert and oriented X 3. Moves all extremities spontaneously. HEENT: Normal  Neck: Supple without bruits or JVD. Lungs:  Resp regular and unlabored, CTA, diminished in bases Heart: RRR no murmurs. Abdomen: Soft, non-tender, non-distended, BS + x 4.  Extremities: No clubbing, cyanosis or edema. DP/PT/Radials 1+ and equal bilaterally.  Labs    Troponin (Point of Care  Test) No results for input(s): TROPIPOC in the last 72 hours. No results for input(s): CKTOTAL, CKMB, TROPONINI in the last 72 hours. Lab Results  Component Value Date   WBC 12.3 (H) 02/03/2017   HGB 10.0 (L) 02/03/2017   HCT 30.9 (L) 02/03/2017   MCV 92.2 02/03/2017   PLT 173 02/03/2017    Recent Labs Lab 01/29/17 1139  02/03/17 0227  NA 134*  < > 137  K 4.4  < > 3.7  CL 101  < > 99*  CO2 21*  < > 27  BUN 72*  < > 58*  CREATININE 6.82*  < > 6.12*  CALCIUM 8.6*  < > 8.5*  PROT 5.5*  --   --   BILITOT 0.5  --   --   ALKPHOS 65  --   --   ALT 15*  --   --   AST 13*  --   --   GLUCOSE 234*  < > 154*  < > = values in this interval not displayed. Lab Results  Component Value Date   CHOL 176 01/30/2017   HDL 29 (L) 01/30/2017   LDLCALC 99 01/30/2017   TRIG 242 (H) 01/30/2017   No results found for: [facility]   Radiology Studies    Ct Abdomen Pelvis Wo Contrast  Result Date: 02/01/2017 CLINICAL DATA:  Gross hematuria EXAM: CT ABDOMEN AND PELVIS WITHOUT CONTRAST TECHNIQUE: Multidetector CT imaging of the abdomen and pelvis was performed following the standard protocol without IV contrast. COMPARISON:  None. FINDINGS: Lower chest: Right atrial and right ventricular leads are noted. Normal size cardiac chambers. Trace pericardial effusion or thickening. Clear lung bases with minimal atelectasis on the left. Hepatobiliary: Colonic interposition is noted between the liver and ventral abdominal wall. Unremarkable noncontrast appearance of the liver. No gallstones are present. Gallbladder is nondistended and nonthickened. No biliary dilatation is noted. Pancreas: Normal Spleen: Normal Adrenals/Urinary Tract: Normal bilateral adrenal glands. Atrophic appearance of the right kidney with cortical thinning and scarring of the left kidney. Bilateral water attenuating renal cysts are noted on the right measuring 1.7 cm off the anterior interpolar aspect and on the left in the lower pole measuring  3 cm.  No nephrolithiasis no obstructive uropathy is seen. The bladder is markedly thickened in appearance and contracted with Foley catheter in place. Stomach/Bowel: Contracted stomach. Normal small and large bowel. Normal appendix. No inflammation or obstruction. Vascular/Lymphatic: Aortoiliac atherosclerosis without aneurysm. No pathologically enlarged lymph nodes. Reproductive: Enlarged prostate measuring approximately 5.9 x 4.7 x 4.2 cm. There are 2 tiny calcifications along the anterior aspect of the Foley catheter along the prostatic urethra. Urinary calculi along the prostatic urethra could have this appearance although these could also represent central zone calcifications within the prostate gland outside but adjacent to the urethra, sagittal series 7, image 108 and series 3, image 78. Other: None Musculoskeletal: Degenerative changes are seen along the dorsal spine. No acute nor suspicious osseous lesions. IMPRESSION: 1. Two punctate calcifications are seen along the anterior aspect of the Foley catheter along the expected course of the prostatic urethra. These may represent urethral stones or potentially with central zone calcifications within the prostate. 2. Atrophy of the right kidney with bilateral renal cortical scarring and cysts as above. No obstructive uropathy. 3. Marked thickening of the bladder decompressed by Foley catheter is noted. Chronic cystitis may have this appearance. A hemorrhagic cystitis might account for the gross hematuria clinically noted. 4. Aortic atherosclerosis. Electronically Signed   By: Ashley Royalty M.D.   On: 02/01/2017 20:21   Dg Chest 2 View  Result Date: 01/29/2017 CLINICAL DATA:  Dizziness EXAM: CHEST  2 VIEW COMPARISON:  01/21/2014 FINDINGS: Pacing device is again seen and stable. Cardiac shadow is stable. Vascular stent is noted in the subclavian region on the right. The lungs are clear bilaterally. Postsurgical changes in the left axilla are noted. IMPRESSION:  No acute abnormality noted. Electronically Signed   By: Inez Catalina M.D.   On: 01/29/2017 12:13   Ct Head Wo Contrast  Result Date: 02/02/2017 CLINICAL DATA:  Syncopal episode.  Hypotension. EXAM: CT HEAD WITHOUT CONTRAST TECHNIQUE: Contiguous axial images were obtained from the base of the skull through the vertex without intravenous contrast. COMPARISON:  CT head without contrast 01/30/2017. FINDINGS: Brain: Mild generalized atrophy and moderate diffuse white matter disease is similar the prior exam. Remote lacunar infarcts involving the left caudate head are again noted. The basal ganglia are otherwise intact. No acute infarct, hemorrhage, or mass lesion present. The insular ribbon is normal bilaterally. The brainstem and cerebellum are within normal limits. Ventricles are proportionate to the degree of fat. No significant extra-axial fluid collection is present. Vascular: Atherosclerotic calcifications are present in the cavernous internal carotid arteries bilaterally. Skull: Calvarium is intact. No focal lytic or blastic lesions are present. Sinuses/Orbits: The paranasal sinuses mastoid air cells are clear. The globes and orbits are within normal limits. IMPRESSION: 1. No acute intracranial abnormality or significant interval change. 2. Stable atrophy and moderate diffuse white matter disease. This likely reflects the sequela of chronic microvascular ischemia. Electronically Signed   By: San Morelle M.D.   On: 02/02/2017 14:00   Ct Head Wo Contrast  Result Date: 01/30/2017 CLINICAL DATA:  81 year old male with possible stroke. Resorption rest that resolved left upper extremity numbness and tingling. Dizziness and slurred speech. EXAM: CT HEAD WITHOUT CONTRAST TECHNIQUE: Contiguous axial images were obtained from the base of the skull through the vertex without intravenous contrast. COMPARISON:  Head CT without contrast 01/29/2017. FINDINGS: Brain: No acute intracranial hemorrhage identified.  No midline shift, mass effect, or evidence of intracranial mass lesion. Stable ventricle size and configuration. Stable gray-white matter differentiation throughout the brain.  Patchy white matter hypodensity and chronic appearing left caudate lacunar infarct. Small chronic appearing right superior cerebellar infarcts. No acute or evolving infarct is identified. Vascular: Calcified atherosclerosis at the skull base. No suspicious intracranial vascular hyperdensity. Skull: Negative.  No acute osseous abnormality identified. Sinuses/Orbits: Visualized paranasal sinuses and mastoids are stable and well pneumatized. Other: Stable and negative orbit and scalp soft tissues. IMPRESSION: Stable non contrast CT appearance of the brain. Evidence of chronic small vessel disease but no acute or evolving cerebral infarct is identified by CT. Electronically Signed   By: Genevie Ann M.D.   On: 01/30/2017 12:54   Ct Head Wo Contrast  Result Date: 01/29/2017 CLINICAL DATA:  Dizziness and slurred speech. Chronic renal failure. History of melanoma EXAM: CT HEAD WITHOUT CONTRAST TECHNIQUE: Contiguous axial images were obtained from the base of the skull through the vertex without intravenous contrast. COMPARISON:  None. FINDINGS: Brain: There is mild diffuse atrophy. Left lateral ventricle is slightly larger than right lateral ventricle, a probable anatomic variant. There is no intracranial mass, hemorrhage, extra-axial fluid collection, or midline shift. There is patchy small vessel disease in the centra semiovale bilaterally. Elsewhere gray-white compartments appear normal. There is no evident acute appearing infarct. Vascular: There is no demonstrable hyperdense vessel. There is calcification in each carotid siphon region. There is also calcification in each distal vertebral artery. Skull: Bony calvarium appears intact. Sinuses/Orbits: There is mild mucosal thickening in the medial right maxillary antrum. There is mucosal thickening  in several ethmoid air cells bilaterally. Other visualized paranasal sinuses are clear. Orbits appear symmetric bilaterally. Other: Mastoid air cells are clear. There is debris in each external auditory canal. IMPRESSION: Mild atrophy with patchy periventricular small vessel disease. No intracranial mass, hemorrhage, or extra-axial fluid collection. No acute appearing infarct evident. There are foci of arteriovascular calcification bilaterally. There are areas of mild paranasal sinus disease. There is probable cerumen in each external auditory canal. Electronically Signed   By: Lowella Grip III M.D.   On: 01/29/2017 14:39    ECG & Cardiac Imaging    EKG 02/02/17: atrial sensed, ventricular paced   Echocardiogram 01/31/17: Study Conclusions - Left ventricle: The cavity size was normal. Wall thickness was   increased in a pattern of mild LVH. Systolic function was   vigorous. The estimated ejection fraction was in the range of 65%   to 70%. There is hypokinesis of the apical myocardium. Doppler   parameters are consistent with abnormal left ventricular   relaxation (grade 1 diastolic dysfunction). - Aortic valve: There was trivial regurgitation. - Mitral valve: Calcified annulus.  Impressions: - Mild apical hypokinesis likely related to pacing; overall   vigorous LV systolic function; mild diastolic dysfunction; mild   LVH; trace AI.   Echocardiogram 07/26/15 [facility]) SUMMARY  The left ventricular size is normal.  There is mild concentric left ventricular hypertrophy.    Left ventricular systolic function is normal.  LV ejection fraction = 55%.    Left ventricular filling pattern is impaired.  Abnormal (paradoxical) septal motion consistent with RV pacemaker. There is   apical LV wall mild hypokinesis  The right ventricle is normal in size and function.  There is mild aortic regurgitation.  There is trace mitral regurgitation.  There is no pericardial effusion.   Compared to the prior study dated 06/13/2014, there is no significant change.   Stress echocardiogram 07/26/15: The patient had no chest pain during stress The patient achieved 88 % of maximum predicted heart rate. Normal  left ventricular function and global wall motion with stress. Negative dobutamine echocardiography for inducible ischemia at target heart rate. Nondiagnostic stress ECG due to pacemaker rhythm.  Assessment & Plan    1. Orthostatic hypotension / syncope, second degree heart block, Afib/flutter, s/p PPM - home meds include ASA, coreg, lasix - he also takes flomax and proscar - repeat orthostatic pressures with decrease in pressure with standing - PPM interrogated today with normal function His coreg will likely need to be adjusted. He is currently on 6.25 mg BID. Plan to hold this tonight and tomorrow for dialysis. Maybe restart coreg 3.125 mg BID if symptoms improve on Wed and follow pressure.   - Consider midodrine as well.    2. Chronic diastolic heart failure - pt is not extremely volume overloaded on exam with continued preserved EF - continue medications as above, agree with holding lasix for now in the setting of near syncope and HD, will defer lasix to nephrology   3. ESRD on HD (T/TH/Sat) and possible TIA - this episode of near syncope/TIA was 1 day following HD and is thought to be related to fluid shifts following dialysis  - orthostatic hypotension can be common in ESRD   4. BPH - currently on flomax and proscar, which can both cause orthostatic hypotension. Foley in place for acute urinary retention. May consider switching to newer agent uroxatral, which could potentially have less effects on orthostatic hypotension (<1%)   5. Possible atrial flutter vs tachycardia - PPM interrogation with < 1% atrial tachycardia or atrial fib/flutter burden; 4 episodes of atrial rate 152-187 (episodes lasted between 4 sec and 1 min 6 sec), which could represent a  flutter rhythm - longest mode switch was 1 min Agree with coumadin, as he could be having short, infrequent bouts of atrial tachycardia vs fib/flutter, which could have contributed to possible TIA this hospitalization   Signed, Ledora Bottcher, PA-C 02/03/2017, 1:27 PM [phone]  Personally seen and examined. Agree with above.  81 year old with worsening orthostatic hypotension, ESRD on HD, aflutter parox, with possible TIA  PE: lungs are clear, no JVD, bruised right arm (shunt in place), RRR   - agree with coumadin, can not exclude atrial flutter on pacer interrogation  - would strongly recommend changing flomax to uroxatral due to less orthostatic side effects  - stopping coreg for now.   - consider midodrine.   Will follow.   Candee Furbish, MD

## 2017-02-03 NOTE — Consult Note (Signed)
[facility] Progress Note   [facility] Rounding Note  Subjective: Slow speech/movement while working with PT this am.  Oriented x3. Wants to continue HD. Had some    Objective Vitals:   02/02/17 2231 02/03/17 0234 02/03/17 0833 02/03/17 1030  BP: (!) 142/71 (!) 150/64 (!) 144/85 (!) 119/46  Pulse: 100 91 (!) 104 (!) 48  Resp: (!) 25 (!) 9 16 (!) 25  Temp: 98.5 F (36.9 C) 98.7 F (37.1 C) 98 F (36.7 C)   TempSrc: Oral Oral Oral   SpO2: 98% 100% 100%   Weight:      Height:       Physical Exam General: Ill-appearing elderly male  Heart: Tachy rhythm  Lungs: CTAB anteriorly  Abdomen: soft NT ND Extremities: No LE edema  Dialysis Access: RUE AVF+ bruit, some oozing blood, bruising to upper arm   Dialysis Orders:  TTS [facility] HD time ^'ing w/each HD 300/800 [facility]) 85.5kg  2/2.25 bath  Hep none  AVF RUA(successful declot/PTA Dr. Posey Pronto 01/28/17 as op (initally placed 01/2014 ) - Hectoral 22mcg IV q HD - Venofer 50mg  IV weekly  Assessment/Plan: 1. Acute CVA - L sided weakness- Head CT without acute abnormality. Echo - EF 70. Gr 1DD. No SOE. Carotid dopplers neg. Anticoagulation recommended b/o aflutter. Warfarin w/plavix bridge.   - per primary/neuro following. 2. Gross Hematuria/Urinary retention - hematuria new this admission. Felt 2/2 enlarged prostate + foley trauma. Foley in to keep pressure on prostate fossa.(Dr. Tresa Moore [facility] saw 5/19 - will need outpt f/u) 3. ESRD - recent HD start (01/14/17; Only had HD 5/8, 5/10, 5/15 ((cannulation issues)) w/5/15 tmt of 2'53" longest since starting PTA) HD tomorrow - 3.5h BFR/DFR 350/800 4K bath No heparin. Working up to 4 hours. 4. Anemia - Hgb 10.0 no esa yet  5. 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 Check phosphorus 6. HTN/volume - BP controlled/ volume stable now below EDW - HD tomorrow even UF  7. H/o A. Fib/ Pacemaker- Warfarin initiated - dosing per pharmacy 8. Nutrition - Renal diet/ prostat for low  albumin   Lynnda Child PA-C [facility] Pager [phone] 02/03/2017,10:52 AM  LOS: 2 days   I have seen and examined this patient and agree with plan and assessment in the above note with renal recommendations/intervention in italics. Plan for HD tomorrow, 3.5 hours (working up to 4 hours) 4K bath. Even UF. Needs phos checked.  Diondre Pulis B,MD 02/03/2017 12:08 PM    Recent Labs Lab 02/01/17 0419 02/02/17 0412 02/03/17 0227  NA 134* 133* 137  K 4.2 3.7 3.7  CL 97* 96* 99*  CO2 26 28 27   GLUCOSE 154* 199* 154*  BUN 56* 38* 58*  CREATININE 6.25* 4.69* 6.12*  CALCIUM 8.8* 8.7* 8.5*  No phosphorus done  Recent Labs Lab 01/29/17 1139  AST 13*  ALT 15*  ALKPHOS 65  BILITOT 0.5  PROT 5.5*  ALBUMIN 2.9*    Recent Labs Lab 01/29/17 1139 01/31/17 0352 02/01/17 0419 02/02/17 0412 02/03/17 0227  WBC 11.0* 11.5* 13.1* 12.3* 12.3*  NEUTROABS 9.2*  --   --   --   --   HGB 9.8* 10.1* 10.7* 10.4* 10.0*  HCT 30.0* 30.3* 32.8* 31.9* 30.9*  MCV 91.7 91.3 91.1 91.4 92.2  PLT 174 167 195 172 173     Recent Labs Lab 02/02/17 0942 02/02/17 1159 02/02/17 1641 02/02/17 2059 02/03/17 0834  GLUCAP 155* 260* 261* 204* 193*    Lab Results  Component  Value Date   INR 2.26 02/03/2017   INR 1.40 02/02/2017   INR 1.03 02/01/2017   Medications: . sodium chloride 999 mL/hr (02/02/17 1015)  . sodium chloride     . calcium acetate  1,334 mg Oral TID WC  . carvedilol  12.5 mg Oral BID WC  . doxercalciferol  4 mcg Intravenous Q T,Th,Sa-HD  . ezetimibe  10 mg Oral Daily  . feeding supplement (PRO-STAT SUGAR FREE 64)  30 mL Oral BID  . fenofibrate  160 mg Oral Daily  . finasteride  5 mg Oral Daily  . insulin aspart  0-5 Units Subcutaneous QHS  . insulin aspart  0-9 Units Subcutaneous TID WC  . ipratropium  2 spray Each Nare Daily  . multivitamin  1 tablet Oral QHS  . tamsulosin  0.4 mg Oral Daily  . warfarin   Does not apply Once  . Warfarin -  Pharmacist Dosing Inpatient   Does not apply [phone]

## 2017-02-03 NOTE — Progress Notes (Signed)
Inpatient Diabetes Program Recommendations  AACE/ADA: New Consensus Statement on Inpatient Glycemic Control (2015)  Target Ranges:  Prepandial:   less than 140 mg/dL      Peak postprandial:   less than 180 mg/dL (1-2 hours)      Critically ill patients:  140 - 180 mg/dL   Results for LYNDON, CHAPEL (MRN [id number]) as of 02/03/2017 11:27  Ref. Range 02/02/2017 06:25 02/02/2017 09:42 02/02/2017 11:59 02/02/2017 16:41 02/02/2017 20:59 02/03/2017 08:34  Glucose-Capillary Latest Ref Range: 65 - 99 mg/dL 178 (H) 155 (H) 260 (H) 261 (H) 204 (H) 193 (H)   Review of Glycemic Control  Diabetes history: DM2 Outpatient Diabetes medications: Amaryl 4 mg BID Current orders for Inpatient glycemic control: Novolog 0-9 units TID with meals, Novolog 0-5 units QHS  Inpatient Diabetes Program Recommendations: Insulin - Meal Coverage: If post prandial glucose remains elevated after changing to Carb Modified diet, may need to order Novolog meal coverage. Diet: Please consider discontinuing current Regular diet and order Carb Modified diet.  Thanks, Barnie Alderman, RN, MSN, CDE Diabetes Coordinator Inpatient Diabetes Program [phone] (Team Pager from 8am to 5pm)

## 2017-02-03 NOTE — Plan of Care (Signed)
Problem: Education: Goal: Knowledge of disease or condition will improve Outcome: Progressing Had a very long discussion with patient and patients wife and daughter  Problem: Self-Care: Goal: Ability to participate in self-care as condition permits will improve Outcome: Progressing Patient seeing pt/ot, looking at in patient rehab once medically stable

## 2017-02-03 NOTE — Care Management Note (Addendum)
Case Management Note  Patient Details  Name: James David MRN: [id number] Date of Birth: Jan 24, 1936  Subjective/Objective:   From home with wife, presents with syncope,  orthostatic hypotension with left arm weakness, also possible small subacute subcortical vs brainstem stroke,(ct negative but not able to have MRI) has pacemaker, concern for embolic secondary to afib not on [facility], has mild dysarthria.  Pt eval rec CIR.   Patient had syncope and orthostatic hypotension again today. Plan for CTA of head and neck. Per MD note.     5/24 [place] BSN - per Anselm Pancoast has received Josem Kaufmann for CIR and will speak with wife to make sure she is on board and she will notify the MD , she has a bed in CIR today for patient.                Action/Plan: NCM will follow for dc needs.  Expected Discharge Date:                  Expected Discharge Plan:  [place]  In-House Referral:     Discharge planning Services  CM Consult  Post Acute Care Choice:    Choice offered to:     DME Arranged:    DME Agency:     HH Arranged:    [place] Agency:     Status of Service:  In process, will continue to follow  If discussed at Long Length of Stay Meetings, dates discussed:    Additional Comments:  Zenon Mayo, RN 02/03/2017, 8:38 PM

## 2017-02-03 NOTE — Progress Notes (Signed)
Rehab admissions - I met with patient and his wife.  Per nurse, patient had an episode today and neurology had been called.  Per nurse, patient not ready for CIR at this time. I left booklets with wife and told her I would let her look them over and then talk about CIR closer to when patient would be medically ready.  Call me for questions.  #[phone]

## 2017-02-03 NOTE — Progress Notes (Signed)
STROKE TEAM PROGRESS NOTE   SUBJECTIVE (INTERVAL HISTORY) Pt Wife and daughter are at bedside. Patient had a syncope episode yesterday after getting up to go the bathroom. Today again patient had near syncope after getting up to go to bathroom. She was found to have brief left arm weakness. After lying supine, symptoms resolved. Pacemaker interrogation cannot rule out A flutter versus atrial tachycardia. Put on TED hose and cardiology also consider midodrine for orthostatic hypotension treatment.   OBJECTIVE Temp:  [97.8 F (36.6 C)-98.7 F (37.1 C)] 97.8 F (36.6 C) (05/21 1604) Pulse Rate:  [37-107] 95 (05/21 1604) Cardiac Rhythm: Normal sinus rhythm (05/21 1500) Resp:  [9-31] 17 (05/21 1604) BP: (102-158)/(46-85) 158/74 (05/21 1604) SpO2:  [98 %-100 %] 100 % (05/21 1604)  CBC:  Recent Labs Lab 01/29/17 1139  02/02/17 0412 02/03/17 0227  WBC 11.0*  < > 12.3* 12.3*  NEUTROABS 9.2*  --   --   --   HGB 9.8*  < > 10.4* 10.0*  HCT 30.0*  < > 31.9* 30.9*  MCV 91.7  < > 91.4 92.2  PLT 174  < > 172 173  < > = values in this interval not displayed.  Basic Metabolic Panel:   Recent Labs Lab 02/02/17 0412 02/03/17 0227  NA 133* 137  K 3.7 3.7  CL 96* 99*  CO2 28 27  GLUCOSE 199* 154*  BUN 38* 58*  CREATININE 4.69* 6.12*  CALCIUM 8.7* 8.5*    Lipid Panel:     Component Value Date/Time   CHOL 176 01/30/2017 0424   TRIG 242 (H) 01/30/2017 0424   HDL 29 (L) 01/30/2017 0424   CHOLHDL 6.1 01/30/2017 0424   VLDL 48 (H) 01/30/2017 0424   LDLCALC 99 01/30/2017 0424   HgbA1c:  Lab Results  Component Value Date   HGBA1C 7.0 (H) 01/30/2017   Urine Drug Screen:     Component Value Date/Time   LABOPIA NONE DETECTED 01/29/2017 1501   COCAINSCRNUR NONE DETECTED 01/29/2017 1501   LABBENZ NONE DETECTED 01/29/2017 1501   AMPHETMU NONE DETECTED 01/29/2017 1501   THCU NONE DETECTED 01/29/2017 1501   LABBARB NONE DETECTED 01/29/2017 1501    Alcohol Level     Component Value  Date/Time   ETH <5 01/29/2017 1139    IMAGING I have personally reviewed the radiological images below and agree with the radiology interpretations.  Dg Chest 2 View 01/29/2017 No acute abnormality noted.   Ct Head Wo Contrast 01/30/2017 Stable non contrast CT appearance of the brain. Evidence of chronic small vessel disease but no acute or evolving cerebral infarct is identified by CT.   Ct Head Wo Contrast 01/29/2017 Mild atrophy with patchy periventricular small vessel disease. No intracranial mass, hemorrhage, or extra-axial fluid collection. No acute appearing infarct evident. There are foci of arteriovascular calcification bilaterally. There are areas of mild paranasal sinus disease. There is probable cerumen in each external auditory canal.   CUS - Bilateral: 1-39% ICA stenosis. Vertebral artery flow is antegrade.  TTE - Mild apical hypokinesis likely related to pacing; overall   vigorous LV systolic function; mild diastolic dysfunction; mild   LVH; trace AI.  CTA head and neck pending   PHYSICAL EXAM  Temp:  [97.8 F (36.6 C)-98.7 F (37.1 C)] 97.8 F (36.6 C) (05/21 1604) Pulse Rate:  [37-107] 95 (05/21 1604) Resp:  [9-31] 17 (05/21 1604) BP: (102-158)/(46-85) 158/74 (05/21 1604) SpO2:  [98 %-100 %] 100 % (05/21 1604)  General - Well nourished,  well developed, in no apparent distress.  Ophthalmologic - Fundi not visualized due to noncooperation.  Cardiovascular - Regular rate and rhythm.  Mental Status -  Level of arousal and orientation to time, place, and person were intact. Language including expression, naming, repetition, comprehension was assessed and found intact. Mild dysarthria Fund of Knowledge was assessed and was intact.   Cranial Nerves II - XII - II - Visual field intact OU. III, IV, VI - Extraocular movements intact. V - Facial sensation intact bilaterally. VII - Facial movement intact bilaterally. VIII - Hearing & vestibular intact  bilaterally. X - Palate elevates symmetrically, mild dysarthria. XI - Chin turning & shoulder shrug intact bilaterally. XII - Tongue protrusion intact.  Motor Strength - The patient's strength was normal in all extremities and pronator drift was absent.  Bulk was normal and fasciculations were absent.   Motor Tone - Muscle tone was assessed at the neck and appendages and was normal.  Reflexes - The patient's reflexes were 1+ in all extremities and he had no pathological reflexes.  Sensory - Light touch, temperature/pinprick were assessed and were symmetrical.    Coordination - The patient had normal movements in the hands with no ataxia or dysmetria.  Tremor was absent.  Gait and Station - deferred   ASSESSMENT/PLAN Mr. TAVIUS TURGEON is a 81 y.o. male with history of AF not on AC, second degree heart block w/ pacer, ESRD on HD, HTN, DB, HLD presenting with L arm numbness and tingling and slurry speech preceded by sweating and dizziness. He did not receive IV t-PA due to delay in arrival.   Syncope episodes / orthostatic hypotension  Related to positional changes, consistent with orthostatic hypotension  Pacemaker interrogation possible A. fib flutter versus atrial tachycardia  Cardiology on board, recommend midodrine  Put on TED hose  Due to concern of left arm weakness during syncopal events, will do CTA head and neck to evaluate vasculature  Possible small subcortical vs. brainstem stroke (CT neg but not able to have MRI), concerning for embolic secondary to afib not on AC  Resultant  Mild dysarthria  CT head Small vessel disease. Atrophy. Sinus dz. Auditory canal cerumen.  Repeat CT head at 24h stable. Small vessel disease.    MRI / MRA pacer  Carotid Doppler  unremarkable  2D Echo  EF 65-70%  LDL 99  HgbA1c 7.0  Heparin 5000 units sq tid for VTE prophylaxis Diet regular Room service appropriate? Yes; Fluid consistency: Thin  aspirin 81 mg daily prior to  admission, now on coumadin for stroke prevention due to hx of aflutter on PPM   Patient counseled to be compliant with his antithrombotic medications  Ongoing aggressive stroke risk factor management  Therapy recommendations:  CIR  Disposition:  pending  (lives w/ spouse)  Atrial flutter  Home anticoagulation none   CHA2DS2-VASc Score = at least 6, ?2 oral anticoagulation recommended  Age in Years:  ?57   +2    Sex:  Male   0    Hypertension History:  yes   +1     Diabetes Mellitus:  yes   +1   Congestive Heart Failure History:  0  Vascular Disease History:  0     Stroke/TIA/Thromboembolism History:  yes   +2  On coumadin for stroke prevention.   Hyperlipidemia  Home meds:  zetia 10 (no statin), resumed in hospital  LDL 99, goal < 70  Statin intolerance  Recommend to follow up with cardiology and  may consider PCSK9 inhibitors.  Diabetes type II  HgbA1c 7.0, goal < 7.0  CBG under control  SSI  Other Stroke Risk Factors  Advanced age  Other Active Problems  ESRD on HD TTS  Anemia of CKD  AVB II degree s/p pacemaker  Hospital day # 2   Rosalin Hawking, MD PhD Stroke Neurology 02/03/2017 6:52 PM   To contact Stroke Continuity provider, please refer to [email or web address] After hours, contact General Neurology

## 2017-02-03 NOTE — Progress Notes (Signed)
Was called into patients room where he was working with physical therapy. Patient was very slow to process commands and was weak on the left side with some left side neglect. His vital signs were stable but bp was lower than earlier. MD was paged and came to bedside. WIll continue to monitor.

## 2017-02-03 NOTE — Progress Notes (Addendum)
Occupational Therapy Treatment Patient Details Name: James David MRN: [id number] DOB: Mar 12, 1936 Today's Date: 02/03/2017    History of present illness Pt is 81 y/o male admitted secondary to suspected TIA. Pt with dysarthria, facial droop , syncopal episode and weakness. PMH includes a fib, CKD on dialysis, HTN, DM with neuropathy, and s/p pacemaker placement and AV fistula placement.    OT comments  Pt seen x 2 this am. Pt initially seen for ADL session. Was [place] for transfers and mobility @ RW level. After using BSC, pt had increased difficulty attending to tasks, demonstrated L arm drift, L inattention, unable to see target in L visual field, trying to reach for therapist's name tag instead of touching therapist's hand in his L visual field. . Pt reclined and nsg called. HR during session 103-144. BP after return to sitting 109/91. MD to see pt. Pt more responsive after reclined. Pt seen for second visit to reassess orthostatics. Supine 107/70. Sitting 119/46. Unable to test standing due to decreased responsiveness, decreased attention to L, L sided weakness, R gaze preference, requiring Max A to squat pivot to bed. Nsg notified. Will continue to follow and reassess goals as needed.   Follow Up Recommendations  CIR;Supervision/Assistance - 24 hour    Equipment Recommendations  Other (comment) (TBA)    Recommendations for Other Services Rehab consult    Precautions / Restrictions Precautions Precautions: Fall Precaution Comments: watch BP       Mobility Bed Mobility Overal bed mobility: Needs Assistance Bed Mobility: Sit to Supine Rolling: Max assist         General bed mobility comments: unable to process and required Max A to recline back into bed and lift BLE onto bed  Transfers Overall transfer level: Needs assistance Equipment used: Rolling walker (2 wheeled) Transfers: Sit to/from [facility] Sit to Stand: Max assist Stand pivot transfers: Max  assist       General transfer comment: increased time; cues for safety; difficulty processing and sequencing information. During first session, pt was min guard for sit - stnad and min A for stand pivot. During second session, pt was max A with sit - stand, and was unable to stand step to chair, requiring Max A from therapist to squat pivot to chair    Balance Overall balance assessment: Needs assistance   Sitting balance-Leahy Scale: Poor       Standing balance-Leahy Scale: Zero                             ADL either performed or assessed with clinical judgement   ADL Overall ADL's : Needs assistance/impaired     Grooming: Minimal assistance;Sitting   Upper Body Bathing: Minimal assistance;Sitting   Lower Body Bathing: Maximal assistance;Sit to/from stand   Upper Body Dressing : Moderate assistance;Sitting       Toilet Transfer: RW;BSC;Moderate assistance   Toileting- Clothing Manipulation and Hygiene: Maximal assistance Toileting - Clothing Manipulation Details (indicate cue type and reason): unable to attend to task and use either R/L hand to clean self     Functional mobility during ADLs: Total assistance;Cueing for sequencing (cues for orientation of self when using RW) General ADL Comments: Pt fluctuates with performance regarding position     Vision   Eye Alignment: Within Functional Limits Tracking/Visual Pursuits: Decreased smoothness of horizontal tracking Saccades: Overshoots Visual Fields: Left visual field deficit Additional Comments: Wears glasses for reading; When pt experiencing "episode", unable  to find target in L visual field   Perception     Praxis      Cognition Arousal/Alertness: Lethargic Behavior During Therapy: Flat affect Overall Cognitive Status: Impaired/Different from baseline Area of Impairment: Attention;Following commands;Safety/judgement;Awareness;Problem solving                   Current Attention Level:  Sustained   Following Commands: Follows one step commands inconsistently Safety/Judgement: Decreased awareness of safety;Decreased awareness of deficits Awareness: Emergent Problem Solving: Slow processing;Decreased initiation;Difficulty sequencing;Requires verbal cues;Requires tactile cues General Comments: slow processing. Increased difficulty with increased activity. Unable to complete bathing/dressing. Unable to complete pericare, requring max A.        Exercises     Shoulder Instructions       General Comments      Pertinent Vitals/ Pain       Pain Assessment: 0-10 Pain Score: 8  Pain Location: catheter Pain Descriptors / Indicators: Burning Pain Intervention(s): Limited activity within patient's tolerance  Home Living                                          Prior Functioning/Environment              Frequency  Min 3X/week        Progress Toward Goals  OT Goals(current goals can now be found in the care plan section)  Progress towards OT goals: Not progressing toward goals - comment;OT to reassess next treatment (decline in status)  Acute Rehab OT Goals Patient Stated Goal: to regain independence OT Goal Formulation: With patient/family Time For Goal Achievement: 02/13/17 Potential to Achieve Goals: Good ADL Goals Pt Will Perform Grooming: with supervision;standing Pt Will Perform Upper Body Dressing: with modified independence;sitting Pt Will Perform Lower Body Dressing: with supervision;sit to/from stand Pt Will Transfer to Toilet: with supervision;ambulating;bedside commode Pt Will Perform Toileting - Clothing Manipulation and hygiene: with supervision;sit to/from stand Pt Will Perform Tub/Shower Transfer: with min guard assist;Shower transfer;ambulating;shower seat;rolling walker;3 in 1 Pt/caregiver will Perform Home Exercise Program: Left upper extremity;With Supervision;With written HEP provided Additional ADL Goal #1: Pt will  complete bed mobility in preparation for seated ADL at EOB with supervision.  Plan Discharge plan remains appropriate    Co-evaluation                 AM-PAC PT "6 Clicks" Daily Activity     Outcome Measure   Help from another person eating meals?: None Help from another person taking care of personal grooming?: A Little Help from another person toileting, which includes using toliet, bedpan, or urinal?: A Lot Help from another person bathing (including washing, rinsing, drying)?: A Lot Help from another person to put on and taking off regular upper body clothing?: A Lot Help from another person to put on and taking off regular lower body clothing?: A Lot 6 Click Score: 15    End of Session Equipment Utilized During Treatment: Gait belt;Rolling walker  OT Visit Diagnosis: Other abnormalities of gait and mobility (R26.89);Ataxia, unspecified (R27.0);Unsteadiness on feet (R26.81);Muscle weakness (generalized) (M62.81)   Activity Tolerance Treatment limited secondary to medical complications (Comment) (increase neuro symptoms)   Patient Left with call bell/phone within reach;in bed;with family/visitor present   Nurse Communication Other (comment) (medical status)        Time: 0981-1914 OT Time Calculation (min): 34 min  2nd visit Time:1030-1045 (15 min)  Charges: OT General Charges $OT Visit:  (2 visits) OT Treatments $Self Care/Home Management : 23-37 mins $Therapeutic Activity: 8-22 mins  [facility], OT/L  [phone] 02/03/2017   Anjuli Gemmill,HILLARY 02/03/2017, 10:50 AM

## 2017-02-03 NOTE — Progress Notes (Signed)
[facility] PROGRESS NOTE  James David HBZ:[id number] DOB: 07/18/1936 DOA: 01/29/2017  PCP: [name], MD  Brief History/Interval Summary: 81 year old male with a past medical history of atrial flutter, followed by cardiology, but not on anticoagulation due to low burden, history of pacemaker placement, history of chronic kidney disease, who started dialysis recently, diabetes mellitus, hypertension, presented with sudden onset of diaphoresis, dizziness, left arm weakness and slurred speech. Concern was for TIA versus stroke. Patient was hospitalized for further management. Warfarin was initiated. Patient had issues with hematuria. He also had acute urinary retention requiring Foley placement. He had an episode of syncope in the hospital. He once again developed left arm weakness when he was ambulated by physical therapy.  Reason for Visit: Acute stroke  Consultants: Neurology. Nephrology. Cardiology  Procedures:  Transthoracic echocardiogram Study Conclusions  - Left ventricle: The cavity size was normal. Wall thickness was   increased in a pattern of mild LVH. Systolic function was   vigorous. The estimated ejection fraction was in the range of 65%   to 70%. There is hypokinesis of the apical myocardium. Doppler   parameters are consistent with abnormal left ventricular   relaxation (grade 1 diastolic dysfunction). - Aortic valve: There was trivial regurgitation. - Mitral valve: Calcified annulus.  Impressions:  - Mild apical hypokinesis likely related to pacing; overall   vigorous LV systolic function; mild diastolic dysfunction; mild   LVH; trace AI.  Carotid Doppler Bilateral:  1-39% ICA stenosis.  Vertebral artery flow is antegrade.    Antibiotics: None  Subjective/Interval History: While walking with physical therapy, patient again experienced lightheadedness and left arm weakness. After being placed back in the chair, he is feeling better. Denies  any headaches. No chest pain or shortness of breath.   ROS: Denies nausea or vomiting  Objective:  Vital Signs  Vitals:   02/02/17 1751 02/02/17 1905 02/02/17 2231 02/03/17 0234  BP: (!) 129/49 (!) 147/65 (!) 142/71 (!) 150/64  Pulse: (!) 101 (!) 104 100 91  Resp: (!) 25 19 (!) 25 (!) 9  Temp: 97.9 F (36.6 C) 98.5 F (36.9 C) 98.5 F (36.9 C) 98.7 F (37.1 C)  TempSrc: Oral Oral Oral Oral  SpO2: 99% 100% 98% 100%  Weight: 82 kg (180 lb 12.4 oz)     Height: 5\' 10"  (1.778 m)       Intake/Output Summary (Last 24 hours) at 02/03/17 0824 Last data filed at 02/03/17 0500  Gross per 24 hour  Intake             1740 ml  Output              900 ml  Net              840 ml   Filed Weights   02/01/17 1457 02/01/17 1935 02/02/17 1751  Weight: 82.8 kg (182 lb 8.7 oz) 81.8 kg (180 lb 5.4 oz) 82 kg (180 lb 12.4 oz)    General appearance: Awake, alert. No distress. Resp: Clear to auscultation bilaterally. No wheezing, rales or rhonchi Cardio: S1, S2 normal. Regular. No S3, S4. No rubs, murmurs, or bruit GI: Abdomen is soft, nontender, nondistended. No masses or organomegaly. Bowel sounds are present. Foley catheter noted. Blood tinged urine is noted, less than before. Extremities: No pedal edema Neurologic: Currently speech appears to be normal. Left-sided facial droop present but about same as before. Tongue is midline. Left-sided pronator drift is present but appears to be improving  per nursing staff and physical therapist. Equal strength.    Lab Results:  Data Reviewed: I have personally reviewed following labs and imaging studies  CBC:  Recent Labs Lab 01/29/17 1139 01/31/17 0352 02/01/17 0419 02/02/17 0412 02/03/17 0227  WBC 11.0* 11.5* 13.1* 12.3* 12.3*  NEUTROABS 9.2*  --   --   --   --   HGB 9.8* 10.1* 10.7* 10.4* 10.0*  HCT 30.0* 30.3* 32.8* 31.9* 30.9*  MCV 91.7 91.3 91.1 91.4 92.2  PLT 174 167 195 172 993    Basic Metabolic Panel:  Recent Labs Lab  01/29/17 1139 01/31/17 0352 02/01/17 0419 02/02/17 0412 02/03/17 0227  NA 134* 134* 134* 133* 137  K 4.4 3.6 4.2 3.7 3.7  CL 101 96* 97* 96* 99*  CO2 21* 26 26 28 27   GLUCOSE 234* 167* 154* 199* 154*  BUN 72* 37* 56* 38* 58*  CREATININE 6.82* 4.82* 6.25* 4.69* 6.12*  CALCIUM 8.6* 8.2* 8.8* 8.7* 8.5*    GFR: Estimated Creatinine Clearance: 10.3 mL/min (A) (by C-G formula based on SCr of 6.12 mg/dL (H)).  Liver Function Tests:  Recent Labs Lab 01/29/17 1139  AST 13*  ALT 15*  ALKPHOS 65  BILITOT 0.5  PROT 5.5*  ALBUMIN 2.9*     Coagulation Profile:  Recent Labs Lab 01/29/17 1139 02/01/17 0419 02/02/17 0412 02/03/17 0227  INR 1.02 1.03 1.40 2.26    CBG:  Recent Labs Lab 02/02/17 0625 02/02/17 0942 02/02/17 1159 02/02/17 1641 02/02/17 2059  GLUCAP 178* 155* 260* 261* 204*    Radiology Studies: Ct Abdomen Pelvis Wo Contrast  Result Date: 02/01/2017 CLINICAL DATA:  Gross hematuria EXAM: CT ABDOMEN AND PELVIS WITHOUT CONTRAST TECHNIQUE: Multidetector CT imaging of the abdomen and pelvis was performed following the standard protocol without IV contrast. COMPARISON:  None. FINDINGS: Lower chest: Right atrial and right ventricular leads are noted. Normal size cardiac chambers. Trace pericardial effusion or thickening. Clear lung bases with minimal atelectasis on the left. Hepatobiliary: Colonic interposition is noted between the liver and ventral abdominal wall. Unremarkable noncontrast appearance of the liver. No gallstones are present. Gallbladder is nondistended and nonthickened. No biliary dilatation is noted. Pancreas: Normal Spleen: Normal Adrenals/Urinary Tract: Normal bilateral adrenal glands. Atrophic appearance of the right kidney with cortical thinning and scarring of the left kidney. Bilateral water attenuating renal cysts are noted on the right measuring 1.7 cm off the anterior interpolar aspect and on the left in the lower pole measuring 3 cm. No  nephrolithiasis no obstructive uropathy is seen. The bladder is markedly thickened in appearance and contracted with Foley catheter in place. Stomach/Bowel: Contracted stomach. Normal small and large bowel. Normal appendix. No inflammation or obstruction. Vascular/Lymphatic: Aortoiliac atherosclerosis without aneurysm. No pathologically enlarged lymph nodes. Reproductive: Enlarged prostate measuring approximately 5.9 x 4.7 x 4.2 cm. There are 2 tiny calcifications along the anterior aspect of the Foley catheter along the prostatic urethra. Urinary calculi along the prostatic urethra could have this appearance although these could also represent central zone calcifications within the prostate gland outside but adjacent to the urethra, sagittal series 7, image 108 and series 3, image 78. Other: None Musculoskeletal: Degenerative changes are seen along the dorsal spine. No acute nor suspicious osseous lesions. IMPRESSION: 1. Two punctate calcifications are seen along the anterior aspect of the Foley catheter along the expected course of the prostatic urethra. These may represent urethral stones or potentially with central zone calcifications within the prostate. 2. Atrophy of the right kidney with  bilateral renal cortical scarring and cysts as above. No obstructive uropathy. 3. Marked thickening of the bladder decompressed by Foley catheter is noted. Chronic cystitis may have this appearance. A hemorrhagic cystitis might account for the gross hematuria clinically noted. 4. Aortic atherosclerosis. Electronically Signed   By: Ashley Royalty M.D.   On: 02/01/2017 20:21   Ct Head Wo Contrast  Result Date: 02/02/2017 CLINICAL DATA:  Syncopal episode.  Hypotension. EXAM: CT HEAD WITHOUT CONTRAST TECHNIQUE: Contiguous axial images were obtained from the base of the skull through the vertex without intravenous contrast. COMPARISON:  CT head without contrast 01/30/2017. FINDINGS: Brain: Mild generalized atrophy and moderate  diffuse white matter disease is similar the prior exam. Remote lacunar infarcts involving the left caudate head are again noted. The basal ganglia are otherwise intact. No acute infarct, hemorrhage, or mass lesion present. The insular ribbon is normal bilaterally. The brainstem and cerebellum are within normal limits. Ventricles are proportionate to the degree of fat. No significant extra-axial fluid collection is present. Vascular: Atherosclerotic calcifications are present in the cavernous internal carotid arteries bilaterally. Skull: Calvarium is intact. No focal lytic or blastic lesions are present. Sinuses/Orbits: The paranasal sinuses mastoid air cells are clear. The globes and orbits are within normal limits. IMPRESSION: 1. No acute intracranial abnormality or significant interval change. 2. Stable atrophy and moderate diffuse white matter disease. This likely reflects the sequela of chronic microvascular ischemia. Electronically Signed   By: San Morelle M.D.   On: 02/02/2017 14:00     Medications:  Scheduled: . calcium acetate  1,334 mg Oral TID WC  . carvedilol  12.5 mg Oral BID WC  . doxercalciferol  4 mcg Intravenous Q T,Th,Sa-HD  . ezetimibe  10 mg Oral Daily  . feeding supplement (PRO-STAT SUGAR FREE 64)  30 mL Oral BID  . fenofibrate  160 mg Oral Daily  . finasteride  5 mg Oral Daily  . heparin  5,000 Units Subcutaneous Q8H  . insulin aspart  0-5 Units Subcutaneous QHS  . insulin aspart  0-9 Units Subcutaneous TID WC  . ipratropium  2 spray Each Nare Daily  . multivitamin  1 tablet Oral QHS  . tamsulosin  0.4 mg Oral Daily  . warfarin   Does not apply Once  . Warfarin - Pharmacist Dosing Inpatient   Does not apply q1800   Continuous: . sodium chloride 999 mL/hr (02/02/17 1015)  . sodium chloride     MHD:[id number] **OR** acetaminophen (TYLENOL) oral liquid 160 mg/5 mL **OR** acetaminophen, ondansetron (ZOFRAN) IV, polyvinyl alcohol  Assessment/Plan:  Principal  Problem:   TIA (transient ischemic attack) Active Problems:   Hypertension   CKD (chronic kidney disease) stage V requiring chronic dialysis (HCC)   DDD (degenerative disc disease)   HLD (hyperlipidemia)   Second degree heart block   Atrial flutter (HCC)   ESRD (end stage renal disease) on dialysis ([place])   Stroke (cerebrum) ([place])    Acute stroke Due to presence of pacemaker, patient could not undergo MRI. CT head was repeated which also did not show any acute process. Patient's speech has improved. Carotid Dopplers without any significant stenosis. Echocardiogram is as above, No significant abnormalities. Seen by neurology. They recommended anticoagulation due to history of atrial flutter. Warfarin was initiated. Plavix was continued for bridging purposes. However, since patient developed hematuria. Plavix was stopped. Warfarin was continued once discussed with urology. LDL is 99. He is allergic to statin. He is on Zetia and fenofibrate. PT and OT evaluation.  Inpatient rehabilitation was recommended. Patient had recurrent episode of left-sided weakness when he was ambulated with physical therapy. He could have dropped his blood pressure resulting in symptoms. Discussed with Dr. Erlinda Hong today. He will evaluate the patient today. CT scan of head done yesterday when he had his episode of syncope did not show any new findings. Since his weakness is improving, I will hold off on further imaging studies until he has been seen by neurology.  Syncope Likely due to orthostatic hypotension or vasovagal since the patient was trying to have a bowel movement. This was on 5/20. Patient, however, does have a pacemaker. Plan was for interrogation today. However, since patient had another episode of dizziness, lightheadedness, we will go ahead and consult cardiology to evaluate patient. His pacemaker will likely need to be interrogated.  Hematuria/Urinary Retention No infection noted on UA. Patient was found to have  urinary retention. had to be placed. Subsequently, he developed hematuria. Urology was consulted. Appreciate their input. Hematuria likely due to history of prostate enlargement. Plan is to leave the Foley catheter in for now. Continue warfarin. Outpatient follow-up in 1 week.   End-stage renal disease on hemodialysis. Started dialysis recently. He is on Tuesday, Thursday, Saturday schedule. Nephrology is following.  History of essential hypertension. Initial permissive hypertension. He was continued on carvedilol. Back on the dose due to the episodes as discussed above.  Anemia of chronic kidney disease. Stable.  History of atrial flutter. Has not been on anticoagulation as his burden of atrial flutter was low. Now with his stroke/TIA, he will need to be on anticoagulation as his Chads2vasc score is at least 6. This has been discussed with the patient by the neurologist. Due to end-stage renal disease cannot use any of the direct oral anticoagulants. Warfarin was initiated.  History of pacemaker placement This was placed in 2015 for Mobitz type II. Followed by electrophysiologist. See above. Cardiology consulted. Pacemaker will likely need to be interrogated.  Hyperlipidemia. Reports intolerance to statin medications. He is on Zetia and fenofibrate. LDL is 99. Consider PCS K-9 inhibitor as outpatient.  Diabetes mellitus type 2. Patient noted to be on oral medications at home. Continue to monitor CBGs. Holding Amaryl. SSI. HbA1c is 7.0.   DVT Prophylaxis: Subcutaneous heparin    Code Status: Full code  Family Communication: Discussed with the patient Disposition Plan: Management as outlined above. PT and OT continue to recommend CIR.    LOS: 2 days   [facility] Pager [phone] 02/03/2017, 8:24 AM  If 7PM-7AM, please contact night-coverage at [email or web address], password [email or web address]

## 2017-02-04 ENCOUNTER — Inpatient Hospital Stay (HOSPITAL_COMMUNITY): Payer: Medicare Other

## 2017-02-04 DIAGNOSIS — I679 Cerebrovascular disease, unspecified: Secondary | ICD-10-CM

## 2017-02-04 DIAGNOSIS — I951 Orthostatic hypotension: Secondary | ICD-10-CM

## 2017-02-04 LAB — CBC
HCT: 29.5 % — ABNORMAL LOW (ref 39.0–52.0)
Hemoglobin: 9.4 g/dL — ABNORMAL LOW (ref 13.0–17.0)
MCH: 29.6 pg (ref 26.0–34.0)
MCHC: 31.9 g/dL (ref 30.0–36.0)
MCV: 92.8 fL (ref 78.0–100.0)
Platelets: 184 10*3/uL (ref 150–400)
RBC: 3.18 MIL/uL — AB (ref 4.22–5.81)
RDW: 13.9 % (ref 11.5–15.5)
WBC: 10.4 10*3/uL (ref 4.0–10.5)

## 2017-02-04 LAB — RENAL FUNCTION PANEL
Albumin: 2.4 g/dL — ABNORMAL LOW (ref 3.5–5.0)
Anion gap: 11 (ref 5–15)
BUN: 83 mg/dL — ABNORMAL HIGH (ref 6–20)
CO2: 24 mmol/L (ref 22–32)
Calcium: 8.2 mg/dL — ABNORMAL LOW (ref 8.9–10.3)
Chloride: 101 mmol/L (ref 101–111)
Creatinine, Ser: 7.08 mg/dL — ABNORMAL HIGH (ref 0.61–1.24)
GFR calc Af Amer: 8 mL/min — ABNORMAL LOW (ref >60)
GFR calc non Af Amer: 7 mL/min — ABNORMAL LOW (ref >60)
Glucose, Bld: 185 mg/dL — ABNORMAL HIGH (ref 65–99)
Phosphorus: 5.3 mg/dL — ABNORMAL HIGH (ref 2.5–4.6)
Potassium: 4 mmol/L (ref 3.5–5.1)
Sodium: 136 mmol/L (ref 135–145)

## 2017-02-04 LAB — GLUCOSE, CAPILLARY
GLUCOSE-CAPILLARY: 240 mg/dL — AB (ref 65–99)
Glucose-Capillary: 181 mg/dL — ABNORMAL HIGH (ref 65–99)
Glucose-Capillary: 123 mg/dL — ABNORMAL HIGH (ref 65–99)

## 2017-02-04 LAB — PROTIME-INR
INR: 2.6
Prothrombin Time: 28.4 seconds — ABNORMAL HIGH (ref 11.4–15.2)

## 2017-02-04 MED ORDER — LIDOCAINE HCL (PF) 1 % IJ SOLN: 5.0000 mL | INTRAMUSCULAR | Status: DC | PRN

## 2017-02-04 MED ORDER — ALTEPLASE 2 MG IJ SOLR: 2.0000 mg | Freq: Once | INTRAMUSCULAR | Status: DC | PRN

## 2017-02-04 MED ORDER — ASPIRIN EC 81 MG PO TBEC
81.0000 mg | DELAYED_RELEASE_TABLET | Freq: Every day | ORAL | Status: DC
  Administered 2017-02-04 – 2017-02-06 (×3): 81 mg via ORAL
  Filled 2017-02-04 (×3): qty 1

## 2017-02-04 MED ORDER — IOPAMIDOL (ISOVUE-370) INJECTION 76%
INTRAVENOUS | Status: AC
  Filled 2017-02-04: qty 50

## 2017-02-04 MED ORDER — DARBEPOETIN ALFA 60 MCG/0.3ML IJ SOSY
PREFILLED_SYRINGE | INTRAMUSCULAR | Status: AC
  Filled 2017-02-04: qty 0.3

## 2017-02-04 MED ORDER — SODIUM CHLORIDE 0.9 % IV SOLN: 100.0000 mL | INTRAVENOUS | Status: DC | PRN

## 2017-02-04 MED ORDER — IOPAMIDOL (ISOVUE-370) INJECTION 76%
INTRAVENOUS | Status: AC
  Administered 2017-02-04: 50 mL
  Filled 2017-02-04: qty 50

## 2017-02-04 MED ORDER — DARBEPOETIN ALFA 60 MCG/0.3ML IJ SOSY
60.0000 ug | PREFILLED_SYRINGE | INTRAMUSCULAR | Status: DC
  Administered 2017-02-04: 60 ug via INTRAVENOUS
  Filled 2017-02-04: qty 0.3

## 2017-02-04 MED ORDER — PENTAFLUOROPROP-TETRAFLUOROETH EX AERO
1.0000 | INHALATION_SPRAY | CUTANEOUS | Status: DC | PRN
Start: 2017-02-04 — End: 2017-02-04

## 2017-02-04 MED ORDER — DOXERCALCIFEROL 4 MCG/2ML IV SOLN
INTRAVENOUS | Status: AC
  Filled 2017-02-04: qty 2

## 2017-02-04 MED ORDER — WARFARIN SODIUM 5 MG PO TABS: 2.5000 mg | ORAL_TABLET | Freq: Once | ORAL | Status: AC

## 2017-02-04 MED ORDER — LIDOCAINE-PRILOCAINE 2.5-2.5 % EX CREA
1.0000 | TOPICAL_CREAM | CUTANEOUS | Status: DC | PRN
Start: 2017-02-04 — End: 2017-02-04

## 2017-02-04 MED ORDER — HEPARIN SODIUM (PORCINE) 1000 UNIT/ML DIALYSIS: 1000.0000 [IU] | INTRAMUSCULAR | Status: DC | PRN

## 2017-02-04 NOTE — Progress Notes (Signed)
[facility] PROGRESS NOTE  James David MEQ:[id number] DOB: [birth year] DOA: 01/29/2017  PCP: [name], MD  Brief History/Interval Summary: 81 year old male with a past medical history of atrial flutter, followed by cardiology, but not on anticoagulation due to low burden, history of pacemaker placement, history of chronic kidney disease, who started dialysis recently, diabetes mellitus, hypertension, presented with sudden onset of diaphoresis, dizziness, left arm weakness and slurred speech. Concern was for TIA versus stroke. Patient was hospitalized for further management. Warfarin was initiated. Patient had issues with hematuria. He also had acute urinary retention requiring Foley placement. He had an episode of syncope in the hospital. He once again developed left arm weakness when he was ambulated by physical therapy.  Reason for Visit: Acute stroke  Consultants: Neurology. Nephrology. Cardiology  Procedures:  Transthoracic echocardiogram Study Conclusions  - Left ventricle: The cavity size was normal. Wall thickness was   increased in a pattern of mild LVH. Systolic function was   vigorous. The estimated ejection fraction was in the range of 65%   to 70%. There is hypokinesis of the apical myocardium. Doppler   parameters are consistent with abnormal left ventricular   relaxation (grade 1 diastolic dysfunction). - Aortic valve: There was trivial regurgitation. - Mitral valve: Calcified annulus.  Impressions:  - Mild apical hypokinesis likely related to pacing; overall   vigorous LV systolic function; mild diastolic dysfunction; mild   LVH; trace AI.  Carotid Doppler Bilateral:  1-39% ICA stenosis.  Vertebral artery flow is antegrade.    Antibiotics: None  Subjective/Interval History: Patient feels well. Denies any weakness in his left arm. Strength is back to normal. Denies any speech difficulty. Hasn't been out of the bed since his episode  yesterday. Denies any chest pain or shortness of breath.   ROS: Denies nausea or vomiting  Objective:  Vital Signs  Vitals:   02/03/17 1917 02/03/17 2223 02/04/17 0250 02/04/17 0816  BP: (!) 156/59 (!) 153/75 (!) 153/89 (!) 161/73  Pulse: (!) 101 (!) 102 (!) 101 86  Resp: (!) 25 (!) 26 19 12   Temp: 98.1 F (36.7 C) 98.2 F (36.8 C) 98 F (36.7 C) 97.8 F (36.6 C)  TempSrc: Oral Oral Oral Oral  SpO2: 100% 100% 100%   Weight:      Height:        Intake/Output Summary (Last 24 hours) at 02/04/17 0917 Last data filed at 02/04/17 0700  Gross per 24 hour  Intake              240 ml  Output             1275 ml  Net            -1035 ml   Filed Weights   02/01/17 1457 02/01/17 1935 02/02/17 1751  Weight: 82.8 kg (182 lb 8.7 oz) 81.8 kg (180 lb 5.4 oz) 82 kg (180 lb 12.4 oz)    General appearance: Awake and alert. No distress. Resp: Clear to auscultation bilaterally. No wheezing, rales, rhonchi Cardio: S1, S2 normal regular. No S3, S4. No rubs, murmurs or bruit GI: Abdomen is soft. Nontender, nondistended. Bowel sounds are present, normal. No masses or organomegaly. Foley catheter is present. Yellow urine is noted. Extremities: No pedal edema Neurologic: Speech is normal. Subtle left-sided facial droop, better than yesterday. Tongue is midline. No pronator drift noted today. Strength is normal. Bilateral upper and lower extremities.     Lab Results:  Data Reviewed: I have  personally reviewed following labs and imaging studies  CBC:  Recent Labs Lab 01/29/17 1139 01/31/17 0352 02/01/17 0419 02/02/17 0412 02/03/17 0227 02/04/17 0424  WBC 11.0* 11.5* 13.1* 12.3* 12.3* 10.4  NEUTROABS 9.2*  --   --   --   --   --   HGB 9.8* 10.1* 10.7* 10.4* 10.0* 9.4*  HCT 30.0* 30.3* 32.8* 31.9* 30.9* 29.5*  MCV 91.7 91.3 91.1 91.4 92.2 92.8  PLT 174 167 195 172 173 376    Basic Metabolic Panel:  Recent Labs Lab 01/31/17 0352 02/01/17 0419 02/02/17 0412 02/03/17 0227  02/04/17 0424  NA 134* 134* 133* 137 136  K 3.6 4.2 3.7 3.7 4.0  CL 96* 97* 96* 99* 101  CO2 26 26 28 27 24   GLUCOSE 167* 154* 199* 154* 185*  BUN 37* 56* 38* 58* 83*  CREATININE 4.82* 6.25* 4.69* 6.12* 7.08*  CALCIUM 8.2* 8.8* 8.7* 8.5* 8.2*  PHOS  --   --   --   --  5.3*    GFR: Estimated Creatinine Clearance: 8.9 mL/min (A) (by C-G formula based on SCr of 7.08 mg/dL (H)).  Liver Function Tests:  Recent Labs Lab 01/29/17 1139 02/04/17 0424  AST 13*  --   ALT 15*  --   ALKPHOS 65  --   BILITOT 0.5  --   PROT 5.5*  --   ALBUMIN 2.9* 2.4*     Coagulation Profile:  Recent Labs Lab 01/29/17 1139 02/01/17 0419 02/02/17 0412 02/03/17 0227 02/04/17 0424  INR 1.02 1.03 1.40 2.26 2.60    CBG:  Recent Labs Lab 02/02/17 2059 02/03/17 0834 02/03/17 1202 02/03/17 1639 02/03/17 2112  GLUCAP 204* 193* 240* 235* 228*    Radiology Studies: Ct Head Wo Contrast  Result Date: 02/02/2017 CLINICAL DATA:  Syncopal episode.  Hypotension. EXAM: CT HEAD WITHOUT CONTRAST TECHNIQUE: Contiguous axial images were obtained from the base of the skull through the vertex without intravenous contrast. COMPARISON:  CT head without contrast 01/30/2017. FINDINGS: Brain: Mild generalized atrophy and moderate diffuse white matter disease is similar the prior exam. Remote lacunar infarcts involving the left caudate head are again noted. The basal ganglia are otherwise intact. No acute infarct, hemorrhage, or mass lesion present. The insular ribbon is normal bilaterally. The brainstem and cerebellum are within normal limits. Ventricles are proportionate to the degree of fat. No significant extra-axial fluid collection is present. Vascular: Atherosclerotic calcifications are present in the cavernous internal carotid arteries bilaterally. Skull: Calvarium is intact. No focal lytic or blastic lesions are present. Sinuses/Orbits: The paranasal sinuses mastoid air cells are clear. The globes and orbits  are within normal limits. IMPRESSION: 1. No acute intracranial abnormality or significant interval change. 2. Stable atrophy and moderate diffuse white matter disease. This likely reflects the sequela of chronic microvascular ischemia. Electronically Signed   By: San Morelle M.D.   On: 02/02/2017 14:00     Medications:  Scheduled: . alfuzosin  10 mg Oral Q breakfast  . calcium acetate  1,334 mg Oral TID WC  . doxercalciferol  4 mcg Intravenous Q T,Th,Sa-HD  . ezetimibe  10 mg Oral Daily  . feeding supplement (PRO-STAT SUGAR FREE 64)  30 mL Oral BID  . fenofibrate  160 mg Oral Daily  . finasteride  5 mg Oral Daily  . insulin aspart  0-5 Units Subcutaneous QHS  . insulin aspart  0-9 Units Subcutaneous TID WC  . iopamidol      . ipratropium  2 spray  Each Nare Daily  . multivitamin  1 tablet Oral QHS  . warfarin  2.5 mg Oral ONCE-1800  . warfarin   Does not apply Once  . Warfarin - Pharmacist Dosing Inpatient   Does not apply q1800   Continuous: . sodium chloride 999 mL/hr (02/02/17 1015)  . sodium chloride     EXB:[id number] **OR** acetaminophen (TYLENOL) oral liquid 160 mg/5 mL **OR** acetaminophen, camphor-menthol, ondansetron (ZOFRAN) IV, polyvinyl alcohol  Assessment/Plan:  Principal Problem:   TIA (transient ischemic attack) Active Problems:   Hypertension   CKD (chronic kidney disease) stage V requiring chronic dialysis (HCC)   DDD (degenerative disc disease)   HLD (hyperlipidemia)   Second degree heart block   Atrial flutter (HCC)   ESRD (end stage renal disease) on dialysis ([place])   Stroke ([place])   Syncope   Orthostatic hypotension    Acute stroke Due to presence of pacemaker, patient could not undergo MRI. CT head was repeated which also did not show any acute process. Carotid Dopplers without any significant stenosis. Echocardiogram is as above, No significant abnormalities. Seen by neurology. They recommended anticoagulation due to history of atrial  flutter. Warfarin was initiated. Plavix was continued for bridging purposes. However, since patient developed hematuria. Plavix was stopped. Warfarin was continued once discussed with urology. LDL is 99. He is allergic to statin. He is on Zetia and fenofibrate. PT and OT evaluation. Inpatient rehabilitation was recommended. Patient had an episode of syncope when he was working with therapy and then subsequently developed left-sided weakness when he was ambulated. This is all felt to be due to orthostatic hypotension. His medications were adjusted by cardiology. Pacemaker was interrogated. He underwent CT angiogram of the head and neck. Does have significant vascular disease. Neurology recommends adding 81 mg of aspirin to the warfarin. We will need to make sure he is able to ambulate with therapy without recurrence symptoms before he can be discharged.   Syncope, likely due to orthostatic hypotension Likely due to orthostatic hypotension or vasovagal since the patient was trying to have a bowel movement. This was on 5/20. Cardiology was consulted. Appreciate their assistance. Pacemaker interrogated. Patient's carvedilol has been discontinued. Repeat orthostatics today. Yesterday afternoon, blood pressure dropped from 253 systolic to 664 systolic from lying to standing position. Heart rate also went up from 90 to 106. Tamsulosin has been changed to alfuzosin.   Hematuria/Urinary Retention No infection noted on UA. Patient was found to have urinary retention. had to be placed. Subsequently, he developed hematuria. Urology was consulted. Appreciate their input. Hematuria likely due to history of prostate enlargement. This appears to have subsided. Plan is to leave the Foley catheter in for now. Outpatient follow-up in 1 week. His Urologist is Dr. Jeffie Pollock. If patient goes to inpatient rehabilitation, then this can likely be addressed there. Tamsulosin has been changed to alfuzosin. Continue finasteride.  End-stage  renal disease on hemodialysis. Started dialysis recently. He is on Tuesday, Thursday, Saturday schedule. Nephrology is following.  Incidental parathyroid adenoma and thyroid nodules Corrected calcium is normal. No recent TSH in our system. One will be ordered. These issues will need to be pursued as outpatient.  History of essential hypertension. Initial permissive hypertension. Now with orthostatic hypotension. Carvedilol has been discontinued.   Anemia of chronic kidney disease. Stable.  History of atrial flutter. Has not been on anticoagulation as his burden of atrial flutter was low. Now with his stroke/TIA, he will need to be on anticoagulation as his Chads2vasc score is at  least 6. Warfarin was initiated after discussions with neurology. INR is therapeutic.  History of pacemaker placement This was placed in 2015 for Mobitz type II. Followed by electrophysiologist. See above. Cardiology following.   Hyperlipidemia. Reports intolerance to statin medications. He is on Zetia and fenofibrate. LDL is 99. Consider PCS K-9 inhibitor as outpatient.  Diabetes mellitus type 2. Patient noted to be on oral medications at home. Continue to monitor CBGs. Holding Amaryl. SSI. HbA1c is 7.0.   DVT Prophylaxis: Subcutaneous heparin    Code Status: Full code  Family Communication: Discussed with the patient Disposition Plan: Management as outlined above. Once patient remains stable with ambulation he can be transitioned to next level of care. Potentially to inpatient rehabilitation in the next 1-2 days.    LOS: 3 days   [facility] Pager [phone] 02/04/2017, 9:17 AM  If 7PM-7AM, please contact night-coverage at [email or web address], password [facility]

## 2017-02-04 NOTE — Procedures (Signed)
I have personally attended this patient's dialysis session. No UFR BP/HR stable 3.5 hour TMT planned   4K bath  [name], MD [place] Pager 02/04/2017, 6:18 PM

## 2017-02-04 NOTE — Progress Notes (Signed)
Progress Note  Patient Name: James David Date of Encounter: 02/04/2017  Primary Cardiologist: Dr. Tamala Julian  Subjective   Patient is feeling well; denies chest pain, SOB, and palpitations. No further episodes of dizziness or near-syncope.  Inpatient Medications    Scheduled Meds: . alfuzosin  10 mg Oral Q breakfast  . calcium acetate  1,334 mg Oral TID WC  . darbepoetin (ARANESP) injection - DIALYSIS  60 mcg Intravenous Q Tue-HD  . doxercalciferol  4 mcg Intravenous Q T,Th,Sa-HD  . ezetimibe  10 mg Oral Daily  . feeding supplement (PRO-STAT SUGAR FREE 64)  30 mL Oral BID  . fenofibrate  160 mg Oral Daily  . finasteride  5 mg Oral Daily  . insulin aspart  0-5 Units Subcutaneous QHS  . insulin aspart  0-9 Units Subcutaneous TID WC  . ipratropium  2 spray Each Nare Daily  . multivitamin  1 tablet Oral QHS  . warfarin  2.5 mg Oral ONCE-1800  . warfarin   Does not apply Once  . Warfarin - Pharmacist Dosing Inpatient   Does not apply q1800   Continuous Infusions: . sodium chloride 999 mL/hr (02/02/17 1015)  . sodium chloride     PRN Meds: acetaminophen **OR** acetaminophen (TYLENOL) oral liquid 160 mg/5 mL **OR** acetaminophen, camphor-menthol, ondansetron (ZOFRAN) IV, polyvinyl alcohol   Vital Signs    Vitals:   02/03/17 1917 02/03/17 2223 02/04/17 0250 02/04/17 0816  BP: (!) 156/59 (!) 153/75 (!) 153/89 (!) 161/73  Pulse: (!) 101 (!) 102 (!) 101 86  Resp: (!) 25 (!) 26 19 12   Temp: 98.1 F (36.7 C) 98.2 F (36.8 C) 98 F (36.7 C) 97.8 F (36.6 C)  TempSrc: Oral Oral Oral Oral  SpO2: 100% 100% 100%   Weight:      Height:        Intake/Output Summary (Last 24 hours) at 02/04/17 1107 Last data filed at 02/04/17 0700  Gross per 24 hour  Intake                0 ml  Output             1275 ml  Net            -1275 ml   Filed Weights   02/01/17 1457 02/01/17 1935 02/02/17 1751  Weight: 182 lb 8.7 oz (82.8 kg) 180 lb 5.4 oz (81.8 kg) 180 lb 12.4 oz (82 kg)      Physical Exam   General: Well developed, well nourished, male appearing in no acute distress, sitting up in chair Head: Normocephalic, atraumatic.  Neck: Supple without bruits, no JVD Lungs:  Resp regular and unlabored, CTA. Heart: RRR, S1, S2, no S3, S4, or murmur; no rub. Abdomen: Soft, non-tender, non-distended with normoactive bowel sounds. No hepatomegaly. No rebound/guarding. No obvious abdominal masses. Extremities: No clubbing, cyanosis, trace edema. Distal pedal pulses are 1+ bilaterally, LE with compression socks in place Neuro: Alert and oriented X 3. Moves all extremities spontaneously. Psych: Normal affect.  Labs    Chemistry Recent Labs Lab 01/29/17 1139  02/02/17 0412 02/03/17 0227 02/04/17 0424  NA 134*  < > 133* 137 136  K 4.4  < > 3.7 3.7 4.0  CL 101  < > 96* 99* 101  CO2 21*  < > 28 27 24   GLUCOSE 234*  < > 199* 154* 185*  BUN 72*  < > 38* 58* 83*  CREATININE 6.82*  < > 4.69* 6.12* 7.08*  CALCIUM 8.6*  < > 8.7* 8.5* 8.2*  PROT 5.5*  --   --   --   --   ALBUMIN 2.9*  --   --   --  2.4*  AST 13*  --   --   --   --   ALT 15*  --   --   --   --   ALKPHOS 65  --   --   --   --   BILITOT 0.5  --   --   --   --   GFRNONAA 7*  < > 11* 8* 7*  GFRAA 8*  < > 13* 9* 8*  ANIONGAP 12  < > 9 11 11   < > = values in this interval not displayed.   Hematology Recent Labs Lab 02/02/17 0412 02/03/17 0227 02/04/17 0424  WBC 12.3* 12.3* 10.4  RBC 3.49* 3.35* 3.18*  HGB 10.4* 10.0* 9.4*  HCT 31.9* 30.9* 29.5*  MCV 91.4 92.2 92.8  MCH 29.8 29.9 29.6  MCHC 32.6 32.4 31.9  RDW 13.9 13.9 13.9  PLT 172 173 184    Cardiac EnzymesNo results for input(s): TROPONINI in the last 168 hours.  Recent Labs Lab 01/29/17 1152  TROPIPOC 0.01     BNPNo results for input(s): BNP, PROBNP in the last 168 hours.   DDimer No results for input(s): DDIMER in the last 168 hours.   Radiology    Ct Angio Head W Or Wo Contrast  Result Date: 02/04/2017 CLINICAL DATA:   Syncope yesterday after going to the bathroom. Brief left arm weakness. EXAM: CT ANGIOGRAPHY HEAD AND NECK TECHNIQUE: Multidetector CT imaging of the head and neck was performed using the standard protocol during bolus administration of intravenous contrast. Multiplanar CT image reconstructions and MIPs were obtained to evaluate the vascular anatomy. Carotid stenosis measurements (when applicable) are obtained utilizing NASCET criteria, using the distal internal carotid diameter as the denominator. CONTRAST:  50 cc Isovue 370 intravenous COMPARISON:  Head CT from 2 days prior FINDINGS: CT HEAD FINDINGS Brain: No evidence of acute infarction, hemorrhage, hydrocephalus, extra-axial collection or mass lesion/mass effect. Chronic small vessel ischemia in the cerebral white matter with remote lacunar infarct of the left caudate head. Vascular: See below Skull: No acute or aggressive finding Sinuses/orbits: Bilateral cataract resection.  No acute finding. Review of the MIP images confirms the above findings CTA NECK FINDINGS Aortic arch: Atherosclerotic plaque of the aortic arch. Two vessel branching. No acute finding Right carotid system: Mild atheromatous calcified plaque at the common carotid bifurcation. Mild atheromatous wall thickening at the ICA bulb. No stenosis, ulceration, or dissection. Left carotid system: Moderate mixed density atherosclerotic plaque the common carotid bifurcation and ICA bulb with no flow limiting stenosis, ulceration, or dissection. Vertebral arteries: No proximal subclavian stenosis. There is severe atheromatous narrowing of the right vertebral artery origin. Moderate narrowing at the left vertebral artery origin. Intermittent likely atheromatous narrowings of the right V2 and V3 segment. The right vertebral artery is patent to the dura. Better patency of the slightly dominant left vertebral artery. Skeleton: No acute or aggressive finding Other neck: 14 mm dense/hypervascular nodule in  the right tracheoesophageal groove with probable polar vessel. Bilateral thyroid nodules, largest in the right upper pole measuring 25 mm. Probable cervicothoracic diffuse idiopathic skeletal hyperostosis. Multilevel ankylosis. Simple suboccipital lipoma. Upper chest: Negative Review of the MIP images confirms the above findings CTA HEAD FINDINGS Anterior circulation: Atherosclerosis of both carotid siphons with at least moderate right  right para clinoid segment stenosis. Moderate narrowing of the left supraclinoid ICA. Hypoplastic right A1 segment with superimposed proximal narrowing. No major branch occlusion is noted. Notable moderate narrowings of the left A2 segment. There is diffuse atheromatous irregularity of bilateral MCA branches. Posterior circulation: Faint intermittently seen flow in the right vertebral artery to the level of the basilar. A right PICA is not seen but there is a sizable right AICA that is filling. The basilar is small in the setting of left fetal type PCA. There is superimposed moderate to advanced narrowing at the midportion. Extensive atheromatous irregularity of bilateral PCA branches. Venous sinuses: Patent Anatomic variants: None significant Delayed phase: No abnormal intracranial enhancement Review of the MIP images confirms the above findings IMPRESSION: 1. Severely depressed flow in the right V4 segment likely due to stenosis at the dura. Small basilar in the setting of fetal type left PCA, with superimposed moderate to advanced mid basilar stenosis. 2. Bilateral intracranial ICA stenosis that is moderate to advanced. 3. Advanced diffuse atherosclerotic irregularity of medium size intracranial vessels. 4. Severe right and moderate left vertebral origin stenosis. 5. No flow limiting stenosis in the atherosclerotic cervical carotid circulation. 6. 14 mm mass in the right tracheoesophageal groove with features of parathyroid adenoma. Correlate with this calcium labs. 7. Bilateral  thyroid nodules measuring up to 25 mm on the right. Electronically Signed   By: Monte Fantasia M.D.   On: 02/04/2017 10:26   Ct Head Wo Contrast  Result Date: 02/02/2017 CLINICAL DATA:  Syncopal episode.  Hypotension. EXAM: CT HEAD WITHOUT CONTRAST TECHNIQUE: Contiguous axial images were obtained from the base of the skull through the vertex without intravenous contrast. COMPARISON:  CT head without contrast 01/30/2017. FINDINGS: Brain: Mild generalized atrophy and moderate diffuse white matter disease is similar the prior exam. Remote lacunar infarcts involving the left caudate head are again noted. The basal ganglia are otherwise intact. No acute infarct, hemorrhage, or mass lesion present. The insular ribbon is normal bilaterally. The brainstem and cerebellum are within normal limits. Ventricles are proportionate to the degree of fat. No significant extra-axial fluid collection is present. Vascular: Atherosclerotic calcifications are present in the cavernous internal carotid arteries bilaterally. Skull: Calvarium is intact. No focal lytic or blastic lesions are present. Sinuses/Orbits: The paranasal sinuses mastoid air cells are clear. The globes and orbits are within normal limits. IMPRESSION: 1. No acute intracranial abnormality or significant interval change. 2. Stable atrophy and moderate diffuse white matter disease. This likely reflects the sequela of chronic microvascular ischemia. Electronically Signed   By: San Morelle M.D.   On: 02/02/2017 14:00   Ct Angio Neck W Or Wo Contrast  Result Date: 02/04/2017 CLINICAL DATA:  Syncope yesterday after going to the bathroom. Brief left arm weakness. EXAM: CT ANGIOGRAPHY HEAD AND NECK TECHNIQUE: Multidetector CT imaging of the head and neck was performed using the standard protocol during bolus administration of intravenous contrast. Multiplanar CT image reconstructions and MIPs were obtained to evaluate the vascular anatomy. Carotid stenosis  measurements (when applicable) are obtained utilizing NASCET criteria, using the distal internal carotid diameter as the denominator. CONTRAST:  50 cc Isovue 370 intravenous COMPARISON:  Head CT from 2 days prior FINDINGS: CT HEAD FINDINGS Brain: No evidence of acute infarction, hemorrhage, hydrocephalus, extra-axial collection or mass lesion/mass effect. Chronic small vessel ischemia in the cerebral white matter with remote lacunar infarct of the left caudate head. Vascular: See below Skull: No acute or aggressive finding Sinuses/orbits: Bilateral cataract resection.  No acute finding. Review of the MIP images confirms the above findings CTA NECK FINDINGS Aortic arch: Atherosclerotic plaque of the aortic arch. Two vessel branching. No acute finding Right carotid system: Mild atheromatous calcified plaque at the common carotid bifurcation. Mild atheromatous wall thickening at the ICA bulb. No stenosis, ulceration, or dissection. Left carotid system: Moderate mixed density atherosclerotic plaque the common carotid bifurcation and ICA bulb with no flow limiting stenosis, ulceration, or dissection. Vertebral arteries: No proximal subclavian stenosis. There is severe atheromatous narrowing of the right vertebral artery origin. Moderate narrowing at the left vertebral artery origin. Intermittent likely atheromatous narrowings of the right V2 and V3 segment. The right vertebral artery is patent to the dura. Better patency of the slightly dominant left vertebral artery. Skeleton: No acute or aggressive finding Other neck: 14 mm dense/hypervascular nodule in the right tracheoesophageal groove with probable polar vessel. Bilateral thyroid nodules, largest in the right upper pole measuring 25 mm. Probable cervicothoracic diffuse idiopathic skeletal hyperostosis. Multilevel ankylosis. Simple suboccipital lipoma. Upper chest: Negative Review of the MIP images confirms the above findings CTA HEAD FINDINGS Anterior circulation:  Atherosclerosis of both carotid siphons with at least moderate right right para clinoid segment stenosis. Moderate narrowing of the left supraclinoid ICA. Hypoplastic right A1 segment with superimposed proximal narrowing. No major branch occlusion is noted. Notable moderate narrowings of the left A2 segment. There is diffuse atheromatous irregularity of bilateral MCA branches. Posterior circulation: Faint intermittently seen flow in the right vertebral artery to the level of the basilar. A right PICA is not seen but there is a sizable right AICA that is filling. The basilar is small in the setting of left fetal type PCA. There is superimposed moderate to advanced narrowing at the midportion. Extensive atheromatous irregularity of bilateral PCA branches. Venous sinuses: Patent Anatomic variants: None significant Delayed phase: No abnormal intracranial enhancement Review of the MIP images confirms the above findings IMPRESSION: 1. Severely depressed flow in the right V4 segment likely due to stenosis at the dura. Small basilar in the setting of fetal type left PCA, with superimposed moderate to advanced mid basilar stenosis. 2. Bilateral intracranial ICA stenosis that is moderate to advanced. 3. Advanced diffuse atherosclerotic irregularity of medium size intracranial vessels. 4. Severe right and moderate left vertebral origin stenosis. 5. No flow limiting stenosis in the atherosclerotic cervical carotid circulation. 6. 14 mm mass in the right tracheoesophageal groove with features of parathyroid adenoma. Correlate with this calcium labs. 7. Bilateral thyroid nodules measuring up to 25 mm on the right. Electronically Signed   By: Monte Fantasia M.D.   On: 02/04/2017 10:26     Telemetry    V paced, in the 100s - Personally Reviewed  ECG    No new tracings - Personally Reviewed   Cardiac Studies   EKG 02/02/17: atrial sensed, ventricular paced   Echocardiogram 01/31/17: Study Conclusions - Left  ventricle: The cavity size was normal. Wall thickness was increased in a pattern of mild LVH. Systolic function was vigorous. The estimated ejection fraction was in the range of 65% to 70%. There is hypokinesis of the apical myocardium. Doppler parameters are consistent with abnormal left ventricular relaxation (grade 1 diastolic dysfunction). - Aortic valve: There was trivial regurgitation. - Mitral valve: Calcified annulus.  Impressions: - Mild apical hypokinesis likely related to pacing; overall vigorous LV systolic function; mild diastolic dysfunction; mild LVH; trace AI.   Echocardiogram 07/26/15 [facility]) SUMMARY  The left ventricular size is normal.  There is  mild concentric left ventricular hypertrophy.    Left ventricular systolic function is normal.  LV ejection fraction = 55%.    Left ventricular filling pattern is impaired.  Abnormal (paradoxical) septal motion consistent with RV pacemaker. There is   apical LV wall mild hypokinesis  The right ventricle is normal in size and function.  There is mild aortic regurgitation.  There is trace mitral regurgitation.  There is no pericardial effusion.  Compared to the prior study dated 06/13/2014, there is no significant change.   Stress echocardiogram 07/26/15: The patient had no chest pain during stress The patient achieved 88 % of maximum predicted heart rate. Normal left ventricular function and global wall motion with stress. Negative dobutamine echocardiography for inducible ischemia at target heart rate. Nondiagnostic stress ECG due to pacemaker rhythm.   Patient Profile     81 y.o. male with a PMH of second degree heart block s/p [employer] dual chamber PPM in place (01/20/14), atrial fibrillation/atypical flutter, chronic diastolic heart failure, HTN, HLD, DM, hx of TIA/stroke, and ESRD on HD. He presented to [facility] via EMS after an episode of sudden diaphoresis, dizziness with slurred speech  and pre-syncope. Head CT without acute abnormalities, thought to be a TIA episode.  Assessment & Plan    1. Orthostatic hypotension / syncope, second degree heart block, Afib/flutter, s/p dual chamber PPM - coreg D/C'ed yesterday - pressure has improved, and is now slightly hypertensive in the 150-160s - scheduled for HD today on his regular schedule (T/TH/Sat) - will defer to nephrology to restart small dose of coreg, from a cardiology standpoint, will likely tolerate 3.125 mg BID - flomax changed to uroxatral for improved orthostatic hypotension - midodrine has not been started - no further episodes of orthostatic hypotension or near-syncope - continue coumadin per pharmacy, INR today 2.60 (2.26) - slightly tachycardic on telemetry, will just monitor today as he has HD this afternoon   2. Chronic diastolic heart failure - pt is not volume up on exam - continue holding lasix for now, defer to nephrology in the setting of dialysis   3. ESRD on HD (T/TH/Sat) - continue to monitor pressure with new HD sessions   Signed, Ledora Bottcher , PA-C 11:07 AM 02/04/2017 Pager: [phone]  Personally seen and examined. Agree with above. Mildly tachy now. May be from holding coreg. Consider restart post dialysis if BP able to tolerate.  Lungs clear, Tachy RR, Pacer noted.  Coumadin, agree, TIA.   No new recs. Will sign off. Please call if ?  Candee Furbish, MD

## 2017-02-04 NOTE — Progress Notes (Signed)
[facility] Progress Note   [facility] Rounding Note  Subjective:  Eating breakfast. No c/os   Objective Vitals:   02/03/17 1917 02/03/17 2223 02/04/17 0250 02/04/17 0816  BP: (!) 156/59 (!) 153/75 (!) 153/89 (!) 161/73  Pulse: (!) 101 (!) 102 (!) 101 86  Resp: (!) 25 (!) 26 19 12   Temp: 98.1 F (36.7 C) 98.2 F (36.8 C) 98 F (36.7 C) 97.8 F (36.6 C)  TempSrc: Oral Oral Oral Oral  SpO2: 100% 100% 100%   Weight:      Height:       Physical Exam General: Ill-appearing elderly male  Heart: RRR Lungs: CTAB anteriorly  Abdomen: soft NT ND Extremities: No LE edema  Dialysis Access: RUE AVF+ bruit, bruising to upper arm     Recent Labs Lab 02/02/17 0412 02/03/17 0227 02/04/17 0424  NA 133* 137 136  K 3.7 3.7 4.0  CL 96* 99* 101  CO2 28 27 24   GLUCOSE 199* 154* 185*  BUN 38* 58* 83*  CREATININE 4.69* 6.12* 7.08*  CALCIUM 8.7* 8.5* 8.2*  PHOS  --   --  5.3*  No phosphorus done  Recent Labs Lab 01/29/17 1139 02/04/17 0424  AST 13*  --   ALT 15*  --   ALKPHOS 65  --   BILITOT 0.5  --   PROT 5.5*  --   ALBUMIN 2.9* 2.4*    Recent Labs Lab 01/29/17 1139 01/31/17 0352 02/01/17 0419 02/02/17 0412 02/03/17 0227 02/04/17 0424  WBC 11.0* 11.5* 13.1* 12.3* 12.3* 10.4  NEUTROABS 9.2*  --   --   --   --   --   HGB 9.8* 10.1* 10.7* 10.4* 10.0* 9.4*  HCT 30.0* 30.3* 32.8* 31.9* 30.9* 29.5*  MCV 91.7 91.3 91.1 91.4 92.2 92.8  PLT 174 167 195 172 173 184     Recent Labs Lab 02/03/17 0834 02/03/17 1202 02/03/17 1639 02/03/17 2112 02/04/17 0813  GLUCAP 193* 240* 235* 228* 181*    Lab Results  Component Value Date   INR 2.60 02/04/2017   INR 2.26 02/03/2017   INR 1.40 02/02/2017   Medications: . sodium chloride 999 mL/hr (02/02/17 1015)  . sodium chloride     . alfuzosin  10 mg Oral Q breakfast  . calcium acetate  1,334 mg Oral TID WC  . doxercalciferol  4 mcg Intravenous Q T,Th,Sa-HD  . ezetimibe  10 mg Oral Daily  . feeding  supplement (PRO-STAT SUGAR FREE 64)  30 mL Oral BID  . fenofibrate  160 mg Oral Daily  . finasteride  5 mg Oral Daily  . insulin aspart  0-5 Units Subcutaneous QHS  . insulin aspart  0-9 Units Subcutaneous TID WC  . ipratropium  2 spray Each Nare Daily  . multivitamin  1 tablet Oral QHS  . warfarin  2.5 mg Oral ONCE-1800  . warfarin   Does not apply Once  . Warfarin - Pharmacist Dosing Inpatient   Does not apply q1800   Dialysis Orders:  TTS [facility] HD time ^'ing w/each HD 300/800 [place] [facility]) 85.5kg  2/2.25 bath  Hep none  AVF RUA(successful declot/PTA Dr. Posey Pronto 01/28/17 as op (initally placed 01/2014 ) - Hectoral 43mcg IV q HD - Venofer 50mg  IV weekly  Assessment/Plan: 1. Acute CVA - L sided weakness- Head CT without acute abnormality. Echo - EF 70. Gr 1DD. No SOE. Carotid dopplers neg. Anticoagulation recommended b/o aflutter. Warfarin w/plavix bridge.   -  per primary/neuro following. 2. James David  Hematuria/Urinary retention - hematuria new this admission. Felt 2/2 enlarged prostate + foley trauma. Foley in to keep pressure on prostate fossa.(Dr. Tresa [facility] saw 5/19 - will need outpt f/u) 3. ESRD - recent HD start (01/14/17; Only had HD 5/8, 5/10, 5/15 ((cannulation issues)) w/5/15 tmt of 2'53" longest since starting PTA) HD today- 3.5h BFR/DFR 350/800 4K bath No heparin. Working up to 4 hours. 4. Anemia - Hgb 9.4  Will give 60 mcg Aranesp with HD 5/22   5. 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  6. HTN/volume - BP controlled/ volume stable now below EDW - For HD today even UF  7. H/o A. Fib/ Pacemaker- Warfarin initiated - dosing per pharmacy 8. Orthostatic hypotension.syncope. No recurrence. Carvedilol off - Dr. Marlou Porch would like restarted at low dose 3.125 BID IF can tolerate. Midodrine has not been restarted. Will reassess post HD  9. Nutrition - Renal diet/ prostat for low albumin   Lynnda Child PA-C [place] Pager  [phone] 02/04/2017,10:44 AM  LOS: 3 days   I have seen and examined this patient and agree with plan and assessment in the above note with renal recommendations/intervention highlighted. HD today. Keeping volume even for today. Carvedilol off and will reassess if appropriate to restart after HD at low dose (see cardiology note).   Kinte Trim B,MD 02/04/2017 12:49 PM

## 2017-02-04 NOTE — Progress Notes (Signed)
Inpatient Diabetes Program Recommendations  AACE/ADA: New Consensus Statement on Inpatient Glycemic Control (2015)  Target Ranges:  Prepandial:   less than 140 mg/dL      Peak postprandial:   less than 180 mg/dL (1-2 hours)      Critically ill patients:  140 - 180 mg/dL   Results for NAJEH, CREDIT (MRN [id number]) as of 02/04/2017 09:45  Ref. Range 02/03/2017 08:34 02/03/2017 12:02 02/03/2017 16:39 02/03/2017 21:12 02/04/2017 08:13  Glucose-Capillary Latest Ref Range: 65 - 99 mg/dL 193 (H) 240 (H) 235 (H) 228 (H) 181 (H)   Review of Glycemic Control  Diabetes history: DM2 Outpatient Diabetes medications: Amaryl 4 mg BID Current orders for Inpatient glycemic control: Novolog 0-9 units TID with meals, Novolog 0-5 units QHS  Inpatient Diabetes Program Recommendations: Insulin - Meal Coverage: If post prandial glucose remains elevated, please consider ordering Novolog 3 units TID with meals for meal coverage if patient eats at least 50% of meals. Diet: Noted diet changed this morning to Carb Modified.  Thanks, Barnie Alderman, RN, MSN, CDE Diabetes Coordinator Inpatient Diabetes Program [phone] (Team Pager from 8am to 5pm)

## 2017-02-04 NOTE — Progress Notes (Signed)
STROKE TEAM PROGRESS NOTE   SUBJECTIVE (INTERVAL HISTORY) No family at bedside. No acute event overnight. His BP stable overnight. Yesterday orthostatic vital showed orthostatic hypotension, TED hose ordered. Cardiology on board and recommended midodrine. Coreg discontinued. His CTA head and neck showed diffuse athro including basilar artery.    OBJECTIVE Temp:  [97.8 F (36.6 C)-98.5 F (36.9 C)] 98 F (36.7 C) (05/22 0250) Pulse Rate:  [37-107] 101 (05/22 0250) Cardiac Rhythm: Ventricular paced (05/22 0250) Resp:  [12-31] 19 (05/22 0250) BP: (102-158)/(46-89) 153/89 (05/22 0250) SpO2:  [100 %] 100 % (05/22 0250)  CBC:  Recent Labs Lab 01/29/17 1139  02/03/17 0227 02/04/17 0424  WBC 11.0*  < > 12.3* 10.4  NEUTROABS 9.2*  --   --   --   HGB 9.8*  < > 10.0* 9.4*  HCT 30.0*  < > 30.9* 29.5*  MCV 91.7  < > 92.2 92.8  PLT 174  < > 173 184  < > = values in this interval not displayed.  Basic Metabolic Panel:   Recent Labs Lab 02/03/17 0227 02/04/17 0424  NA 137 136  K 3.7 4.0  CL 99* 101  CO2 27 24  GLUCOSE 154* 185*  BUN 58* 83*  CREATININE 6.12* 7.08*  CALCIUM 8.5* 8.2*  PHOS  --  5.3*    Lipid Panel:     Component Value Date/Time   CHOL 176 01/30/2017 0424   TRIG 242 (H) 01/30/2017 0424   HDL 29 (L) 01/30/2017 0424   CHOLHDL 6.1 01/30/2017 0424   VLDL 48 (H) 01/30/2017 0424   LDLCALC 99 01/30/2017 0424   HgbA1c:  Lab Results  Component Value Date   HGBA1C 7.0 (H) 01/30/2017   Urine Drug Screen:     Component Value Date/Time   LABOPIA NONE DETECTED 01/29/2017 1501   COCAINSCRNUR NONE DETECTED 01/29/2017 1501   LABBENZ NONE DETECTED 01/29/2017 1501   AMPHETMU NONE DETECTED 01/29/2017 1501   THCU NONE DETECTED 01/29/2017 1501   LABBARB NONE DETECTED 01/29/2017 1501    Alcohol Level     Component Value Date/Time   ETH <5 01/29/2017 1139    IMAGING I have personally reviewed the radiological images below and agree with the radiology  interpretations.  Dg Chest 2 View 01/29/2017 No acute abnormality noted.   Ct Head Wo Contrast 01/30/2017 Stable non contrast CT appearance of the brain. Evidence of chronic small vessel disease but no acute or evolving cerebral infarct is identified by CT.   Ct Head Wo Contrast 01/29/2017 Mild atrophy with patchy periventricular small vessel disease. No intracranial mass, hemorrhage, or extra-axial fluid collection. No acute appearing infarct evident. There are foci of arteriovascular calcification bilaterally. There are areas of mild paranasal sinus disease. There is probable cerumen in each external auditory canal.   CUS - Bilateral: 1-39% ICA stenosis. Vertebral artery flow is antegrade.  TTE - Mild apical hypokinesis likely related to pacing; overall   vigorous LV systolic function; mild diastolic dysfunction; mild   LVH; trace AI.  Ct Angio Head and neck W Or Wo Contrast 02/04/2017 IMPRESSION: 1. Severely depressed flow in the right V4 segment likely due to stenosis at the dura. Small basilar in the setting of fetal type left PCA, with superimposed moderate to advanced mid basilar stenosis. 2. Bilateral intracranial ICA stenosis that is moderate to advanced. 3. Advanced diffuse atherosclerotic irregularity of medium size intracranial vessels. 4. Severe right and moderate left vertebral origin stenosis. 5. No flow limiting stenosis in the atherosclerotic  cervical carotid circulation. 6. 14 mm mass in the right tracheoesophageal groove with features of parathyroid adenoma. Correlate with this calcium labs. 7. Bilateral thyroid nodules measuring up to 25 mm on the right.    PHYSICAL EXAM  Temp:  [97.8 F (36.6 C)-98.5 F (36.9 C)] 98 F (36.7 C) (05/22 0250) Pulse Rate:  [37-107] 101 (05/22 0250) Resp:  [12-31] 19 (05/22 0250) BP: (102-158)/(46-89) 153/89 (05/22 0250) SpO2:  [100 %] 100 % (05/22 0250)  General - Well nourished, well developed, in no apparent  distress.  Ophthalmologic - Fundi not visualized due to noncooperation.  Cardiovascular - Regular rate and rhythm.  Mental Status -  Level of arousal and orientation to time, place, and person were intact. Language including expression, naming, repetition, comprehension was assessed and found intact. Mild dysarthria Fund of Knowledge was assessed and was intact.   Cranial Nerves II - XII - II - Visual field intact OU. III, IV, VI - Extraocular movements intact. V - Facial sensation intact bilaterally. VII - Facial movement intact bilaterally. VIII - Hearing & vestibular intact bilaterally. X - Palate elevates symmetrically, mild dysarthria. XI - Chin turning & shoulder shrug intact bilaterally. XII - Tongue protrusion intact.  Motor Strength - The patient's strength was normal in all extremities and pronator drift was absent.  Bulk was normal and fasciculations were absent.   Motor Tone - Muscle tone was assessed at the neck and appendages and was normal.  Reflexes - The patient's reflexes were 1+ in all extremities and he had no pathological reflexes.  Sensory - Light touch, temperature/pinprick were assessed and were symmetrical.    Coordination - The patient had normal movements in the hands with no ataxia or dysmetria.  Tremor was absent.  Gait and Station - deferred   ASSESSMENT/PLAN [name] is a 81 y.o. male with history of AF not on AC, second degree heart block w/ pacer, ESRD on HD, HTN, DB, HLD presenting with L arm numbness and tingling and slurry speech preceded by sweating and dizziness. He did not receive IV t-PA due to delay in arrival.   Syncope episodes / orthostatic hypotension  Related to positional changes, consistent with orthostatic hypotension  Pacemaker interrogation possible A. fib flutter versus atrial tachycardia  Cardiology on board  Coreg discontinued  Agree with midodrine if needed  Put on TED hose  CTA head and neck showed  diffuse athro but no high grade stenosis. The vessels involved including b/l ICA proximal, b/ carotid siphons, right VA and mid BA.   BP goal 120-150  Avoid hypotension  Possible small subcortical vs. brainstem stroke (CT neg but not able to have MRI), concerning for embolic secondary to afib not on AC  Resultant  Mild dysarthria  CT head Small vessel disease. Atrophy. Sinus dz. Auditory canal cerumen.  Repeat CT head at 24h stable. Small vessel disease.    CTA head and neck showed diffuse athro but no high grade stenosis. The vessels involved including b/l ICA proximal, b/ carotid siphons, right VA and mid BA.   MRI / MRA pacer  Carotid Doppler  unremarkable  2D Echo  EF 65-70%  LDL 99  HgbA1c 7.0  Heparin 5000 units sq tid for VTE prophylaxis Diet regular Room service appropriate? Yes; Fluid consistency: Thin  aspirin 81 mg daily prior to admission, now on coumadin for stroke prevention due to hx of aflutter on PPM. Also due to diffuse athero, recommend ASA 81mg  on top of coumadin.  Patient counseled to be compliant with his antithrombotic medications  Ongoing aggressive stroke risk factor management  Therapy recommendations:  CIR  Disposition:  pending  (lives w/ spouse)  Atrial flutter  Home anticoagulation none   CHA2DS2-VASc Score = at least 6, ?2 oral anticoagulation recommended  Age in Years:  ?32   +2    Sex:  Male   0    Hypertension History:  yes   +1     Diabetes Mellitus:  yes   +1   Congestive Heart Failure History:  0  Vascular Disease History:  0     Stroke/TIA/Thromboembolism History:  yes   +2  On coumadin for stroke prevention.   Intracranial stenosis  Likely to explain his symptoms with orthostatic hypotension  CTA head and neck showed diffuse athro but no high grade stenosis. The vessels involved including b/l ICA proximal, b/ carotid siphons, right VA and mid BA.   BP goal 120-150  Recommend baby ASA on top of  coumadin.  Hyperlipidemia  Home meds:  zetia 10 (no statin), resumed in hospital  LDL 99, goal < 70  Statin intolerance  Recommend to follow up with cardiology and may consider PCSK9 inhibitors.  Diabetes type II  HgbA1c 7.0, goal < 7.0  CBG under control  SSI  Other Stroke Risk Factors  Advanced age  Other Active Problems  ESRD on HD TTS  Anemia of CKD  AVB II degree s/p pacemaker  Hospital day # 3  Neurology will sign off. Please call with questions. Pt will follow up with Dr. Erlinda Hong at [facility] in about 6 weeks. Thanks for the consult.  Rosalin Hawking, MD PhD Stroke Neurology 02/04/2017 11:11 AM   To contact Stroke Continuity provider, please refer to [email or web address] After hours, contact General Neurology

## 2017-02-04 NOTE — Progress Notes (Signed)
ANTICOAGULATION CONSULT NOTE - FOLLOW UP  Pharmacy Consult:  Coumadin Indication: atrial fibrillation in setting of acute stroke/TIA   Allergies  Allergen Reactions  . Amlodipine Besylate Swelling    angioedema  . Lisinopril Other (See Comments)    Renal insufficiency  . Statins Other (See Comments)    Muscle aches    Patient Measurements: Height: 5\' 10"  (177.8 cm) Weight: 180 lb 12.4 oz (82 kg) IBW/kg (Calculated) : 73  Vital Signs: Temp: 97.8 F (36.6 C) (05/22 0816) Temp Source: Oral (05/22 0816) BP: 161/73 (05/22 0816) Pulse Rate: 86 (05/22 0816)  Labs:  Recent Labs  02/02/17 0412 02/03/17 0227 02/04/17 0424  HGB 10.4* 10.0* 9.4*  HCT 31.9* 30.9* 29.5*  PLT 172 173 184  LABPROT 17.3* 25.3* 28.4*  INR 1.40 2.26 2.60  CREATININE 4.69* 6.12* 7.08*    Estimated Creatinine Clearance: 8.9 mL/min (A) (by C-G formula based on SCr of 7.08 mg/dL (H)).    Assessment: 39 YOM with history of Aflutter.  He has not been on anticoagulation due to low risk for stroke.  Here with acute stroke/TIA and Pharmacy consulted on 01/31/17 to manage Coumadin.    INR  = 2.6  Goal of Therapy:  INR 2-3  Plan:  Warfarin 2.5 mg x 1 Daily INR  Levester Fresh, PharmD, BCPS, BCCCP Clinical Pharmacist Clinical phone for 02/04/2017 from 7a-3:30p: [phone] If after 3:30p, please call main pharmacy at: [phone] 02/04/2017 8:46 AM

## 2017-02-04 NOTE — Clinical Social Work Note (Signed)
CSW continues to follow for discharge needs.  Shalah Estelle, CSW [phone]  

## 2017-02-04 NOTE — Progress Notes (Signed)
I await further progress with therapy to assist in determining most appropriate rehab venue options. [id number]

## 2017-02-05 DIAGNOSIS — G458 Other transient cerebral ischemic attacks and related syndromes: Secondary | ICD-10-CM

## 2017-02-05 DIAGNOSIS — R55 Syncope and collapse: Secondary | ICD-10-CM

## 2017-02-05 LAB — CBC
HEMATOCRIT: 29.7 % — AB (ref 39.0–52.0)
Hemoglobin: 9.5 g/dL — ABNORMAL LOW (ref 13.0–17.0)
MCH: 29.5 pg (ref 26.0–34.0)
MCHC: 32 g/dL (ref 30.0–36.0)
MCV: 92.2 fL (ref 78.0–100.0)
PLATELETS: 184 10*3/uL (ref 150–400)
RBC: 3.22 MIL/uL — ABNORMAL LOW (ref 4.22–5.81)
RDW: 14 % (ref 11.5–15.5)
WBC: 10.8 10*3/uL — AB (ref 4.0–10.5)

## 2017-02-05 LAB — TSH: TSH: 0.699 u[IU]/mL (ref 0.350–4.500)

## 2017-02-05 LAB — GLUCOSE, CAPILLARY
GLUCOSE-CAPILLARY: 247 mg/dL — AB (ref 65–99)
Glucose-Capillary: 175 mg/dL — ABNORMAL HIGH (ref 65–99)
Glucose-Capillary: 315 mg/dL — ABNORMAL HIGH (ref 65–99)

## 2017-02-05 LAB — PROTIME-INR
INR: 2.08
Prothrombin Time: 23.7 seconds — ABNORMAL HIGH (ref 11.4–15.2)

## 2017-02-05 MED ORDER — CARVEDILOL 3.125 MG PO TABS
3.1250 mg | ORAL_TABLET | Freq: Two times a day (BID) | ORAL | Status: DC
  Administered 2017-02-05 (×2): 3.125 mg via ORAL
  Filled 2017-02-05 (×2): qty 1

## 2017-02-05 NOTE — Progress Notes (Signed)
PROGRESS NOTE    James David  CHE:[id number] DOB: Jun 01, 1936 DOA: 01/29/2017 PCP: [name], MD   Brief Narrative: 81 year old male with a past medical history of atrial flutter, followed by cardiology, but was not on anticoagulation, history of pacemaker placement, history of chronic kidney disease, who started dialysis recently, diabetes mellitus, hypertension, presented with sudden onset of diaphoresis, dizziness, left arm weakness and slurred speech. Concern was for TIA versus stroke. Patient was hospitalized for further management. Warfarin was initiated. Patient had issues with hematuria. He also had acute urinary retention requiring Foley placement. He had an episode of syncope in the hospital. He once again developed left arm weakness when he was ambulated by physical therapy.  Assessment & Plan:   # Possible small subcortical versus brainstem stroke concerning for embolic secondary due to a atrial flutter not on anticoagulation: -CT head with a small vessel disease -CT head and neck shows diffuse stenosis. No MRA MRI because of pacemaker. Carotid Doppler ultrasound unremarkable. Echo with EF of 65-70%. LDL 99. A1c 7.0. -Evaluated by stroke neurologist. The need recommended Coumadin and aspirin 81. -PT OT evaluation ongoing. Inpatient rehab is evaluating for possible inpatient rehabilitation admission. -Stroke neurologist recommended to follow-up with Dr. Erlinda Hong at [facility] in 6 weeks.  # Syncope likely due to orthostatic hypotension: Evaluated by cardiology. Pacemaker was interrogated. Coreg was briefly held however cardiology recommended to resume lower dose. Clinically improved. Tamsulosin has been changed to alfuzosin.  #Hematuria/acute urinary retention: Evaluated by urology. Hematuria likely due to history of prostate enlargement. Plan is to leave the Foley catheter for now. Outpatient follow-up in one week with his urologist Dr.Wrenn. If patient goes to inpatient rehabilitation  and then this can likely be addressed there. Continue Alfuzosin and finasteride.  #ESRD on hemodialysis as per nephrology.  #Incidental parathyroid adenoma and thyroid nodule: TSH is acceptable. Recommended outpatient follow-up with PCP and likely with endocrinologist.  #History of essential hypertension: Blood pressure is elevated with tachycardia. Cardiology recommended resuming Coreg. Low-dose Coreg resume this morning. Continue to monitor heart rate and blood pressure closely.  #Anemia of chronic kidney disease: Monitor CBC.  #History of atrial flutter: Now on Coumadin. Monitor INR. Continue aspirin.  #History of pacemaker placement  Type 2 diabetes: Continue blood sugar level monitoring.   Principal Problem:   TIA (transient ischemic attack) Active Problems:   Hypertension   CKD (chronic kidney disease) stage V requiring chronic dialysis (HCC)   DDD (degenerative disc disease)   HLD (hyperlipidemia)   Second degree heart block   Atrial flutter (HCC)   ESRD (end stage renal disease) on dialysis (HCC)   Stroke (HCC)   Syncope   Orthostatic hypotension   Intracranial vascular stenosis  DVT prophylaxis: Coumadin Code Status: Full code Family Communication: Discussed with the patient's wife at bedside Disposition Plan: Likely discharge to CIR when accepted. Evaluation ongoing    Consultants:   Neurology  Cardiology  Nephrology  Procedures: CT scan of head, echo and carotid Doppler Antimicrobials: None  Subjective: Seen and examined at bedside. Sitting on chair comfortably. Denied headache, dizziness, nausea, vomiting, chest pressure shortness of breath. Reports generalized weakness  Objective: Vitals:   02/04/17 2242 02/05/17 0235 02/05/17 0700 02/05/17 0753  BP: (!) 150/60 (!) 156/71  (!) 152/65  Pulse: (!) 104 (!) 104  (!) 119  Resp: (!) 29 10  (!) 28  Temp: 98.6 F (37 C) 97.8 F (36.6 C)  98 F (36.7 C)  TempSrc: Oral Oral Oral Oral  SpO2: 100% 100%   98%  Weight:      Height:        Intake/Output Summary (Last 24 hours) at 02/05/17 1234 Last data filed at 02/05/17 0900  Gross per 24 hour  Intake         70746.55 ml  Output              700 ml  Net         70046.55 ml   Filed Weights   02/02/17 1751 02/04/17 1645 02/04/17 2027  Weight: 82 kg (180 lb 12.4 oz) 82.7 kg (182 lb 5.1 oz) 82.7 kg (182 lb 5.1 oz)    Examination:  General exam: Appears calm and comfortable  Respiratory system: Clear to auscultation. Respiratory effort normal. No wheezing or crackle Cardiovascular system: S1 & S2 heard, Regular but tachycardic.  No pedal edema. Gastrointestinal system: Abdomen is nondistended, soft and nontender. Normal bowel sounds heard. Central nervous system: Alert and oriented. No focal neurological deficits. Extremities: Symmetric 5 x 5 power. Skin: No rashes, lesions or ulcers Psychiatry: Judgement and insight appear normal. Mood & affect appropriate.     Data Reviewed: I have personally reviewed following labs and imaging studies  CBC:  Recent Labs Lab 02/01/17 0419 02/02/17 0412 02/03/17 0227 02/04/17 0424 02/05/17 0230  WBC 13.1* 12.3* 12.3* 10.4 10.8*  HGB 10.7* 10.4* 10.0* 9.4* 9.5*  HCT 32.8* 31.9* 30.9* 29.5* 29.7*  MCV 91.1 91.4 92.2 92.8 92.2  PLT 195 172 173 184 154   Basic Metabolic Panel:  Recent Labs Lab 01/31/17 0352 02/01/17 0419 02/02/17 0412 02/03/17 0227 02/04/17 0424  NA 134* 134* 133* 137 136  K 3.6 4.2 3.7 3.7 4.0  CL 96* 97* 96* 99* 101  CO2 26 26 28 27 24   GLUCOSE 167* 154* 199* 154* 185*  BUN 37* 56* 38* 58* 83*  CREATININE 4.82* 6.25* 4.69* 6.12* 7.08*  CALCIUM 8.2* 8.8* 8.7* 8.5* 8.2*  PHOS  --   --   --   --  5.3*   GFR: Estimated Creatinine Clearance: 8.9 mL/min (A) (by C-G formula based on SCr of 7.08 mg/dL (H)). Liver Function Tests:  Recent Labs Lab 02/04/17 0424  ALBUMIN 2.4*   No results for input(s): LIPASE, AMYLASE in the last 168 hours. No results for  input(s): AMMONIA in the last 168 hours. Coagulation Profile:  Recent Labs Lab 02/01/17 0419 02/02/17 0412 02/03/17 0227 02/04/17 0424 02/05/17 0230  INR 1.03 1.40 2.26 2.60 2.08   Cardiac Enzymes: No results for input(s): CKTOTAL, CKMB, CKMBINDEX, TROPONINI in the last 168 hours. BNP (last 3 results) No results for input(s): PROBNP in the last 8760 hours. HbA1C: No results for input(s): HGBA1C in the last 72 hours. CBG:  Recent Labs Lab 02/03/17 2112 02/04/17 0813 02/04/17 1207 02/04/17 2132 02/05/17 0745  GLUCAP 228* 181* 240* 123* 175*   Lipid Profile: No results for input(s): CHOL, HDL, LDLCALC, TRIG, CHOLHDL, LDLDIRECT in the last 72 hours. Thyroid Function Tests:  Recent Labs  02/05/17 0230  TSH 0.699   Anemia Panel: No results for input(s): VITAMINB12, FOLATE, FERRITIN, TIBC, IRON, RETICCTPCT in the last 72 hours. Sepsis Labs: No results for input(s): PROCALCITON, LATICACIDVEN in the last 168 hours.  Recent Results (from the past 240 hour(s))  Culture, Urine     Status: Abnormal   Collection Time: 01/31/17 11:34 AM  Result Value Ref Range Status   Specimen Description URINE, CLEAN CATCH  Final   Special Requests NONE  Final   Culture MULTIPLE SPECIES PRESENT, SUGGEST RECOLLECTION (A)  Final   Report Status 02/01/2017 FINAL  Final  MRSA PCR Screening     Status: None   Collection Time: 02/02/17  6:00 PM  Result Value Ref Range Status   MRSA by PCR NEGATIVE NEGATIVE Final    Comment:        The GeneXpert MRSA Assay (FDA approved for NASAL specimens only), is one component of a comprehensive MRSA colonization surveillance program. It is not intended to diagnose MRSA infection nor to guide or monitor treatment for MRSA infections.          Radiology Studies: Ct Angio Head W Or Wo Contrast  Result Date: 02/04/2017 CLINICAL DATA:  Syncope yesterday after going to the bathroom. Brief left arm weakness. EXAM: CT ANGIOGRAPHY HEAD AND NECK  TECHNIQUE: Multidetector CT imaging of the head and neck was performed using the standard protocol during bolus administration of intravenous contrast. Multiplanar CT image reconstructions and MIPs were obtained to evaluate the vascular anatomy. Carotid stenosis measurements (when applicable) are obtained utilizing NASCET criteria, using the distal internal carotid diameter as the denominator. CONTRAST:  50 cc Isovue 370 intravenous COMPARISON:  Head CT from 2 days prior FINDINGS: CT HEAD FINDINGS Brain: No evidence of acute infarction, hemorrhage, hydrocephalus, extra-axial collection or mass lesion/mass effect. Chronic small vessel ischemia in the cerebral white matter with remote lacunar infarct of the left caudate head. Vascular: See below Skull: No acute or aggressive finding Sinuses/orbits: Bilateral cataract resection.  No acute finding. Review of the MIP images confirms the above findings CTA NECK FINDINGS Aortic arch: Atherosclerotic plaque of the aortic arch. Two vessel branching. No acute finding Right carotid system: Mild atheromatous calcified plaque at the common carotid bifurcation. Mild atheromatous wall thickening at the ICA bulb. No stenosis, ulceration, or dissection. Left carotid system: Moderate mixed density atherosclerotic plaque the common carotid bifurcation and ICA bulb with no flow limiting stenosis, ulceration, or dissection. Vertebral arteries: No proximal subclavian stenosis. There is severe atheromatous narrowing of the right vertebral artery origin. Moderate narrowing at the left vertebral artery origin. Intermittent likely atheromatous narrowings of the right V2 and V3 segment. The right vertebral artery is patent to the dura. Better patency of the slightly dominant left vertebral artery. Skeleton: No acute or aggressive finding Other neck: 14 mm dense/hypervascular nodule in the right tracheoesophageal groove with probable polar vessel. Bilateral thyroid nodules, largest in the  right upper pole measuring 25 mm. Probable cervicothoracic diffuse idiopathic skeletal hyperostosis. Multilevel ankylosis. Simple suboccipital lipoma. Upper chest: Negative Review of the MIP images confirms the above findings CTA HEAD FINDINGS Anterior circulation: Atherosclerosis of both carotid siphons with at least moderate right right para clinoid segment stenosis. Moderate narrowing of the left supraclinoid ICA. Hypoplastic right A1 segment with superimposed proximal narrowing. No major branch occlusion is noted. Notable moderate narrowings of the left A2 segment. There is diffuse atheromatous irregularity of bilateral MCA branches. Posterior circulation: Faint intermittently seen flow in the right vertebral artery to the level of the basilar. A right PICA is not seen but there is a sizable right AICA that is filling. The basilar is small in the setting of left fetal type PCA. There is superimposed moderate to advanced narrowing at the midportion. Extensive atheromatous irregularity of bilateral PCA branches. Venous sinuses: Patent Anatomic variants: None significant Delayed phase: No abnormal intracranial enhancement Review of the MIP images confirms the above findings IMPRESSION: 1. Severely depressed flow in the right V4  segment likely due to stenosis at the dura. Small basilar in the setting of fetal type left PCA, with superimposed moderate to advanced mid basilar stenosis. 2. Bilateral intracranial ICA stenosis that is moderate to advanced. 3. Advanced diffuse atherosclerotic irregularity of medium size intracranial vessels. 4. Severe right and moderate left vertebral origin stenosis. 5. No flow limiting stenosis in the atherosclerotic cervical carotid circulation. 6. 14 mm mass in the right tracheoesophageal groove with features of parathyroid adenoma. Correlate with this calcium labs. 7. Bilateral thyroid nodules measuring up to 25 mm on the right. Electronically Signed   By: Monte Fantasia M.D.   On:  02/04/2017 10:26   Ct Angio Neck W Or Wo Contrast  Result Date: 02/04/2017 CLINICAL DATA:  Syncope yesterday after going to the bathroom. Brief left arm weakness. EXAM: CT ANGIOGRAPHY HEAD AND NECK TECHNIQUE: Multidetector CT imaging of the head and neck was performed using the standard protocol during bolus administration of intravenous contrast. Multiplanar CT image reconstructions and MIPs were obtained to evaluate the vascular anatomy. Carotid stenosis measurements (when applicable) are obtained utilizing NASCET criteria, using the distal internal carotid diameter as the denominator. CONTRAST:  50 cc Isovue 370 intravenous COMPARISON:  Head CT from 2 days prior FINDINGS: CT HEAD FINDINGS Brain: No evidence of acute infarction, hemorrhage, hydrocephalus, extra-axial collection or mass lesion/mass effect. Chronic small vessel ischemia in the cerebral white matter with remote lacunar infarct of the left caudate head. Vascular: See below Skull: No acute or aggressive finding Sinuses/orbits: Bilateral cataract resection.  No acute finding. Review of the MIP images confirms the above findings CTA NECK FINDINGS Aortic arch: Atherosclerotic plaque of the aortic arch. Two vessel branching. No acute finding Right carotid system: Mild atheromatous calcified plaque at the common carotid bifurcation. Mild atheromatous wall thickening at the ICA bulb. No stenosis, ulceration, or dissection. Left carotid system: Moderate mixed density atherosclerotic plaque the common carotid bifurcation and ICA bulb with no flow limiting stenosis, ulceration, or dissection. Vertebral arteries: No proximal subclavian stenosis. There is severe atheromatous narrowing of the right vertebral artery origin. Moderate narrowing at the left vertebral artery origin. Intermittent likely atheromatous narrowings of the right V2 and V3 segment. The right vertebral artery is patent to the dura. Better patency of the slightly dominant left vertebral  artery. Skeleton: No acute or aggressive finding Other neck: 14 mm dense/hypervascular nodule in the right tracheoesophageal groove with probable polar vessel. Bilateral thyroid nodules, largest in the right upper pole measuring 25 mm. Probable cervicothoracic diffuse idiopathic skeletal hyperostosis. Multilevel ankylosis. Simple suboccipital lipoma. Upper chest: Negative Review of the MIP images confirms the above findings CTA HEAD FINDINGS Anterior circulation: Atherosclerosis of both carotid siphons with at least moderate right right para clinoid segment stenosis. Moderate narrowing of the left supraclinoid ICA. Hypoplastic right A1 segment with superimposed proximal narrowing. No major branch occlusion is noted. Notable moderate narrowings of the left A2 segment. There is diffuse atheromatous irregularity of bilateral MCA branches. Posterior circulation: Faint intermittently seen flow in the right vertebral artery to the level of the basilar. A right PICA is not seen but there is a sizable right AICA that is filling. The basilar is small in the setting of left fetal type PCA. There is superimposed moderate to advanced narrowing at the midportion. Extensive atheromatous irregularity of bilateral PCA branches. Venous sinuses: Patent Anatomic variants: None significant Delayed phase: No abnormal intracranial enhancement Review of the MIP images confirms the above findings IMPRESSION: 1. Severely depressed flow in the  right V4 segment likely due to stenosis at the dura. Small basilar in the setting of fetal type left PCA, with superimposed moderate to advanced mid basilar stenosis. 2. Bilateral intracranial ICA stenosis that is moderate to advanced. 3. Advanced diffuse atherosclerotic irregularity of medium size intracranial vessels. 4. Severe right and moderate left vertebral origin stenosis. 5. No flow limiting stenosis in the atherosclerotic cervical carotid circulation. 6. 14 mm mass in the right  tracheoesophageal groove with features of parathyroid adenoma. Correlate with this calcium labs. 7. Bilateral thyroid nodules measuring up to 25 mm on the right. Electronically Signed   By: Monte Fantasia M.D.   On: 02/04/2017 10:26        Scheduled Meds: . alfuzosin  10 mg Oral Q breakfast  . aspirin EC  81 mg Oral Daily  . calcium acetate  1,334 mg Oral TID WC  . carvedilol  3.125 mg Oral BID WC  . darbepoetin (ARANESP) injection - DIALYSIS  60 mcg Intravenous Q Tue-HD  . doxercalciferol  4 mcg Intravenous Q T,Th,Sa-HD  . ezetimibe  10 mg Oral Daily  . feeding supplement (PRO-STAT SUGAR FREE 64)  30 mL Oral BID  . fenofibrate  160 mg Oral Daily  . finasteride  5 mg Oral Daily  . insulin aspart  0-5 Units Subcutaneous QHS  . insulin aspart  0-9 Units Subcutaneous TID WC  . ipratropium  2 spray Each Nare Daily  . multivitamin  1 tablet Oral QHS  . warfarin  2.5 mg Oral ONCE-1800  . warfarin   Does not apply Once  . Warfarin - Pharmacist Dosing Inpatient   Does not apply q1800   Continuous Infusions: . sodium chloride 999 mL/hr (02/02/17 1015)  . sodium chloride       LOS: 4 days    Tresia Revolorio Tanna Furry, MD [facility] Pager [phone]  If 7PM-7AM, please contact night-coverage [email or web address] Password [facility] 02/05/2017, 12:34 PM

## 2017-02-05 NOTE — Progress Notes (Signed)
I met with pt and his wife today while he was receiving OT. Feel pt would benefit from an inpt rehab admission., I will begin insurance authorization with [facility]. Karene Fry will follow up tomorrow. Pt and wife in agreement to this plan. [phone]

## 2017-02-05 NOTE — Progress Notes (Signed)
Physical Therapy Treatment Patient Details Name: James David MRN: [id number] DOB: 01-06-1936 Today's Date: 02/05/2017    History of Present Illness Pt is 81 y/o male admitted secondary to suspected TIA. Pt with dysarthria, facial droop , syncopal episode and weakness. PMH includes a fib, CKD on dialysis, HTN, DM with neuropathy, and s/p pacemaker placement and AV fistula placement.     PT Comments    Patient continues to make progress toward mobility goals. Pt with elevate RR at times with mobility and other vitals WNL. Current plan remains appropriate.   Follow Up Recommendations  CIR;Supervision/Assistance - 24 hour     Equipment Recommendations  None recommended by PT    Recommendations for Other Services       Precautions / Restrictions Precautions Precautions: Fall Precaution Comments: watch BP and RR    Mobility  Bed Mobility               General bed mobility comments: pt OOB in chair upon arrival  Transfers Overall transfer level: Needs assistance Equipment used: Rolling walker (2 wheeled) Transfers: Sit to/from Stand Sit to Stand: Mod assist         General transfer comment: assist to power up into standing; cues for safe hand placement each trial; stood from recliner and commode   Ambulation/Gait Ambulation/Gait assistance: Min assist Ambulation Distance (Feet): 150 Feet Assistive device: Rolling walker (2 wheeled) Gait Pattern/deviations: Step-through pattern;Decreased stride length;Decreased dorsiflexion - right;Decreased dorsiflexion - left;Trunk flexed     General Gait Details: cues for posture, proximity of RW, and stride length; pt able to improve bilat step length and DF with max cues; pt with difficulty negotiating objects on L side; mild gait deviations with horizontal head turns   Stairs            Wheelchair Mobility    Modified Rankin (Stroke Patients Only)       Balance Overall balance assessment: Needs assistance    Sitting balance-Leahy Scale: Fair     Standing balance support: Bilateral upper extremity supported;During functional activity Standing balance-Leahy Scale: Poor                              Cognition Arousal/Alertness: Awake/alert Behavior During Therapy: WFL for tasks assessed/performed Overall Cognitive Status: Within Functional Limits for tasks assessed                                        Exercises      General Comments General comments (skin integrity, edema, etc.): pt with elevated RR especially standing after BM; all other vitals WNL      Pertinent Vitals/Pain Pain Assessment: No/denies pain Pain Intervention(s): Monitored during session    Home Living                      Prior Function            PT Goals (current goals can now be found in the care plan section) Progress towards PT goals: Progressing toward goals    Frequency    Min 3X/week      PT Plan Current plan remains appropriate    Co-evaluation              AM-PAC PT "6 Clicks" Daily Activity  Outcome Measure  Difficulty turning over in bed (including adjusting bedclothes,  sheets and blankets)?: A Little Difficulty moving from lying on back to sitting on the side of the bed? : Total Difficulty sitting down on and standing up from a chair with arms (e.g., wheelchair, bedside commode, etc,.)?: Total Help needed moving to and from a bed to chair (including a wheelchair)?: A Little Help needed walking in hospital room?: A Little Help needed climbing 3-5 steps with a railing? : A Lot 6 Click Score: 13    End of Session Equipment Utilized During Treatment: Gait belt Activity Tolerance: Patient tolerated treatment well Patient left: in chair;with call bell/phone within reach;with chair alarm set Nurse Communication: Mobility status PT Visit Diagnosis: Other abnormalities of gait and mobility (R26.89);Muscle weakness (generalized) (M62.81)      Time: 1010-1044 PT Time Calculation (min) (ACUTE ONLY): 34 min  Charges:  $Gait Training: 8-22 mins $Therapeutic Activity: 8-22 mins                    G Codes:       James David, PTA Pager: [phone]     Darliss Cheney 02/05/2017, 1:33 PM

## 2017-02-05 NOTE — Progress Notes (Signed)
[facility] Progress Note   [facility] Rounding Note  Subjective:  Feels "fidgity" Some SOB. Sitting up in chair at bed side. Denies chest pain N/V/D No c/os with HD yesterday other than bored lying in the bed.  Objective Vitals:   02/04/17 2242 02/05/17 0235 02/05/17 0700 02/05/17 0753  BP: (!) 150/60 (!) 156/71  (!) 152/65  Pulse: (!) 104 (!) 104  (!) 119  Resp: (!) 29 10  (!) 28  Temp: 98.6 F (37 C) 97.8 F (36.6 C)  98 F (36.7 C)  TempSrc: Oral Oral Oral Oral  SpO2: 100% 100%  98%  Weight:      Height:       Physical Exam General: Ill-appearing elderly male  Heart: RRR Lungs: CTAB anteriorly  Abdomen: soft NT ND Extremities: No LE edema wearing compression hose  Dialysis Access: RUE AVF+ bruit, bruising to upper arm     Recent Labs Lab 02/02/17 0412 02/03/17 0227 02/04/17 0424  NA 133* 137 136  K 3.7 3.7 4.0  CL 96* 99* 101  CO2 28 27 24   GLUCOSE 199* 154* 185*  BUN 38* 58* 83*  CREATININE 4.69* 6.12* 7.08*  CALCIUM 8.7* 8.5* 8.2*  PHOS  --   --  5.3*  done  Recent Labs Lab 02/04/17 0424  ALBUMIN 2.4*    Recent Labs Lab 02/01/17 0419 02/02/17 0412 02/03/17 0227 02/04/17 0424 02/05/17 0230  WBC 13.1* 12.3* 12.3* 10.4 10.8*  HGB 10.7* 10.4* 10.0* 9.4* 9.5*  HCT 32.8* 31.9* 30.9* 29.5* 29.7*  MCV 91.1 91.4 92.2 92.8 92.2  PLT 195 172 173 184 184     Recent Labs Lab 02/03/17 2112 02/04/17 0813 02/04/17 1207 02/04/17 2132 02/05/17 0745  GLUCAP 228* 181* 240* 123* 175*    Lab Results  Component Value Date   INR 2.08 02/05/2017   INR 2.60 02/04/2017   INR 2.26 02/03/2017   Medications: . sodium chloride 999 mL/hr (02/02/17 1015)  . sodium chloride     . alfuzosin  10 mg Oral Q breakfast  . aspirin EC  81 mg Oral Daily  . calcium acetate  1,334 mg Oral TID WC  . carvedilol  3.125 mg Oral BID WC  . darbepoetin (ARANESP) injection - DIALYSIS  60 mcg Intravenous Q Tue-HD  . doxercalciferol  4 mcg Intravenous Q  T,Th,Sa-HD  . ezetimibe  10 mg Oral Daily  . feeding supplement (PRO-STAT SUGAR FREE 64)  30 mL Oral BID  . fenofibrate  160 mg Oral Daily  . finasteride  5 mg Oral Daily  . insulin aspart  0-5 Units Subcutaneous QHS  . insulin aspart  0-9 Units Subcutaneous TID WC  . ipratropium  2 spray Each Nare Daily  . multivitamin  1 tablet Oral QHS  . warfarin  2.5 mg Oral ONCE-1800  . warfarin   Does not apply Once  . Warfarin - Pharmacist Dosing Inpatient   Does not apply q1800   Dialysis Orders:  TTS [facility] HD time ^'ing w/each HD 300/800 [address] [facility]) 85.5kg  2/2.25 bath  Hep none  AVF RUA(successful declot/PTA Dr. Posey Pronto 01/28/17 as op (initally placed 01/2014 ) - Hectoral 24mcg IV q HD - Venofer 50mg  IV weekly  Assessment/Plan: 1. Acute CVA - L sided weakness- Head CT without acute abnormality. Echo - EF 70. Gr 1DD. No SOE. Carotid dopplers neg. Anticoagulation recommended b/o aflutter. Warfarin w/plavix bridge.   -  per primary/neuro following. 2. Gross Hematuria/Urinary retention - hematuria new this admission. Felt  2/2 enlarged prostate + foley trauma. Foley in to keep pressure on prostate fossa.(Dr. Tresa [facility] saw 5/19 - will need outpt f/u) Urine currently clear 3. ESRD - recent HD start (01/14/17; Only had HD 5/8, 5/10, 5/15 ((cannulation issues)) w/5/15 tmt of 2'53" longest since starting PTA) Working up to 4 hours - Plan 3.75 hours  3K bath for HD 5/24  4. Anemia - Hgb 9.5  Aranesp 60 mcg IV dosed 5/22  5. 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  6. H/o A. Fib/ Pacemaker- Warfarin initiated - dosing per pharmacy 7. Orthostatic hypotension.syncope No recurrence. Back on carvedilol 3.125mg  per cardiology recommendations./No UF with HD yesterday - Now below outpt edw - will attempt 1-2L UF goal with HD tomorrow with elevated BP and mild SOB  8. Nutrition - Renal diet/ prostat for low albumin   Lynnda Child PA-C [place] Pager  [phone] 02/05/2017,12:32 PM   I have seen and examined this patient and agree with plan and assessment in the above note with renal recommendations/intervention highlighted.   Criss Pallone B,MD 02/05/2017 1:36 PM

## 2017-02-05 NOTE — Care Management Important Message (Signed)
Important Message  Patient Details  Name: James David MRN: [id number] Date of Birth: 1936-05-04   Medicare Important Message Given:  Yes    Nathen May 02/05/2017, 10:11 AM

## 2017-02-05 NOTE — Progress Notes (Signed)
Occupational Therapy Treatment Patient Details Name: James David MRN: [id number] DOB: [birth year] Today's Date: 02/05/2017    History of present illness Pt is 81 y/o male admitted secondary to suspected TIA. Pt with dysarthria, facial droop , syncopal episode and weakness. PMH includes a fib, CKD on dialysis, HTN, DM with neuropathy, and s/p pacemaker placement and AV fistula placement.    OT comments  Pt progressing toward goals. After ambulating to bathroom, pt stating he felt "woozy". BP 168/67. RR 33 HR 120. Requires mod vc for safety, mod vc for correct positioning of body in RW, vc to avoid running into items consistently on L. PTA, pt completely independent with ADL and mobility without an AD, including driving. Pt demonstrates a significant functional decline from his baseline and feel pt would benefit from skilled rehab at [facility]. Pt motivated to return to PLOF. Very supportive wife. Will continue to follow acutely.  Follow Up Recommendations  CIR;Supervision/Assistance - 24 hour    Equipment Recommendations  Other (comment)    Recommendations for Other Services Rehab consult    Precautions / Restrictions Precautions Precautions: Fall Precaution Comments: watch BP and RR       Mobility Bed Mobility               General bed mobility comments: pt OOB in chair upon arrival  Transfers Overall transfer level: Needs assistance Equipment used: Rolling walker (2 wheeled) Transfers: Sit to/from Stand Sit to Stand: Min assist         General transfer comment: vc for safety    Balance     Sitting balance-Leahy Scale: Good       Standing balance-Leahy Scale: Poor Standing balance comment: UE support needed for balance                           ADL either performed or assessed with clinical judgement   ADL Overall ADL's : Needs assistance/impaired     Grooming: Min guard;Standing   Upper Body Bathing: Supervision/ safety;Set up;Sitting   Lower  Body Bathing: Moderate assistance;Sit to/from stand   Upper Body Dressing : Minimal assistance;Sitting   Lower Body Dressing: Moderate assistance;Sit to/from stand   Toilet Transfer: Minimal assistance;RW;Comfort height toilet;Ambulation;Grab bars   Toileting- Clothing Manipulation and Hygiene: Minimal assistance;Sit to/from stand       Functional mobility during ADLs: Minimal assistance;Rolling walker;Cueing for safety General ADL Comments: Pt reports being more fatigued than what is his baseline     Vision   Additional Comments: will further assess. Pt running into items on L fairly consistently   Perception     Praxis      Cognition Arousal/Alertness: Awake/alert Behavior During Therapy: WFL for tasks assessed/performed Overall Cognitive Status: Impaired/Different from baseline Area of Impairment: Attention;Safety/judgement;Awareness                   Current Attention Level: Selective  As pt's friend talking with him, his position in the RW worsened, requiring cues to attend to task and improve positioning   Safety/Judgement: Decreased awareness of safety;Decreased awareness of deficits Awareness: Emergent   General Comments: Repetition required for safety. Pt with unsaef use of RW. Note that pt continues to run RW into items on L side. Poor orientation of self in RW        Exercises     Shoulder Instructions       General Comments      Pertinent Vitals/ Pain  Pain Assessment: No/denies pain  Home Living                                          Prior Functioning/Environment              Frequency  Min 3X/week        Progress Toward Goals  OT Goals(current goals can now be found in the care plan section)  Progress towards OT goals: Progressing toward goals  Acute Rehab OT Goals Patient Stated Goal: to regain independence OT Goal Formulation: With patient/family Time For Goal Achievement: 02/13/17 Potential  to Achieve Goals: Good ADL Goals Pt Will Perform Grooming: with supervision;standing Pt Will Perform Upper Body Dressing: with modified independence;sitting Pt Will Perform Lower Body Dressing: with supervision;sit to/from stand Pt Will Transfer to Toilet: with supervision;ambulating;bedside commode Pt Will Perform Toileting - Clothing Manipulation and hygiene: with supervision;sit to/from stand Pt Will Perform Tub/Shower Transfer: with min guard assist;Shower transfer;ambulating;shower seat;rolling walker;3 in 1 Pt/caregiver will Perform Home Exercise Program: Left upper extremity;With Supervision;With written HEP provided Additional ADL Goal #1: Pt will complete bed mobility in preparation for seated ADL at EOB with supervision.  Plan Discharge plan remains appropriate    Co-evaluation                 AM-PAC PT "6 Clicks" Daily Activity     Outcome Measure   Help from another person eating meals?: None Help from another person taking care of personal grooming?: A Little Help from another person toileting, which includes using toliet, bedpan, or urinal?: A Little Help from another person bathing (including washing, rinsing, drying)?: A Lot Help from another person to put on and taking off regular upper body clothing?: A Little Help from another person to put on and taking off regular lower body clothing?: A Lot 6 Click Score: 17    End of Session Equipment Utilized During Treatment: Rolling walker  OT Visit Diagnosis: Unsteadiness on feet (R26.81);Other abnormalities of gait and mobility (R26.89);Other symptoms and signs involving the nervous system (R29.898);Muscle weakness (generalized) (M62.81)   Activity Tolerance Patient tolerated treatment well   Patient Left in chair;with call bell/phone within reach;with family/visitor present   Nurse Communication Mobility status        Time: 0973-5329 OT Time Calculation (min): 23 min  Charges:    [facility], OT/L   [phone] 02/05/2017   Breianna Delfino,HILLARY 02/05/2017, 3:03 PM

## 2017-02-05 NOTE — Progress Notes (Signed)
ANTICOAGULATION CONSULT NOTE - FOLLOW UP  Pharmacy Consult:  Coumadin Indication: atrial fibrillation in setting of acute stroke/TIA   Allergies  Allergen Reactions  . Amlodipine Besylate Swelling    angioedema  . Lisinopril Other (See Comments)    Renal insufficiency  . Statins Other (See Comments)    Muscle aches    Patient Measurements: Height: 5\' 10"  (177.8 cm) Weight: 182 lb 5.1 oz (82.7 kg) IBW/kg (Calculated) : 73  Vital Signs: Temp: 98 F (36.7 C) (05/23 0753) Temp Source: Oral (05/23 0753) BP: 152/65 (05/23 0753) Pulse Rate: 119 (05/23 0753)  Labs:  Recent Labs  02/03/17 0227 02/04/17 0424 02/05/17 0230  HGB 10.0* 9.4* 9.5*  HCT 30.9* 29.5* 29.7*  PLT 173 184 184  LABPROT 25.3* 28.4* 23.7*  INR 2.26 2.60 2.08  CREATININE 6.12* 7.08*  --     Estimated Creatinine Clearance: 8.9 mL/min (A) (by C-G formula based on SCr of 7.08 mg/dL (H)).   Assessment: 48 YOM with history of Aflutter.  He has not been on anticoagulation due to low risk for stroke.  Here with acute stroke/TIA.  INR  = 2.06 today  Goal of Therapy:  INR 2-3  Plan:  Warfarin 5 mg x 1 Daily INR  Levester Fresh, PharmD, BCPS, BCCCP Clinical Pharmacist Clinical phone for 02/05/2017 from 7a-3:30p: [phone] If after 3:30p, please call main pharmacy at: [phone] 02/05/2017 8:27 AM

## 2017-02-06 ENCOUNTER — Other Ambulatory Visit: Payer: Self-pay

## 2017-02-06 ENCOUNTER — Inpatient Hospital Stay (HOSPITAL_COMMUNITY)
Admission: RE | Admit: 2017-02-06 | Discharge: 2017-02-15 | DRG: 091 | Disposition: A | Discharge status: Home Health | Payer: Medicare Other | Source: Intra-hospital | Attending: Physical Medicine & Rehabilitation | Admitting: Physical Medicine & Rehabilitation

## 2017-02-06 ENCOUNTER — Encounter (HOSPITAL_COMMUNITY): Payer: Self-pay | Admitting: Emergency Medicine

## 2017-02-06 DIAGNOSIS — N186 End stage renal disease: Secondary | ICD-10-CM | POA: Diagnosis present

## 2017-02-06 DIAGNOSIS — Z8249 Family history of ischemic heart disease and other diseases of the circulatory system: Secondary | ICD-10-CM

## 2017-02-06 DIAGNOSIS — N2581 Secondary hyperparathyroidism of renal origin: Secondary | ICD-10-CM | POA: Diagnosis present

## 2017-02-06 DIAGNOSIS — E785 Hyperlipidemia, unspecified: Secondary | ICD-10-CM | POA: Diagnosis present

## 2017-02-06 DIAGNOSIS — R338 Other retention of urine: Secondary | ICD-10-CM | POA: Diagnosis present

## 2017-02-06 DIAGNOSIS — E1122 Type 2 diabetes mellitus with diabetic chronic kidney disease: Secondary | ICD-10-CM | POA: Diagnosis present

## 2017-02-06 DIAGNOSIS — I69354 Hemiplegia and hemiparesis following cerebral infarction affecting left non-dominant side: Secondary | ICD-10-CM | POA: Diagnosis not present

## 2017-02-06 DIAGNOSIS — Z961 Presence of intraocular lens: Secondary | ICD-10-CM | POA: Diagnosis present

## 2017-02-06 DIAGNOSIS — I639 Cerebral infarction, unspecified: Secondary | ICD-10-CM | POA: Diagnosis not present

## 2017-02-06 DIAGNOSIS — D631 Anemia in chronic kidney disease: Secondary | ICD-10-CM | POA: Diagnosis present

## 2017-02-06 DIAGNOSIS — K5900 Constipation, unspecified: Secondary | ICD-10-CM | POA: Diagnosis not present

## 2017-02-06 DIAGNOSIS — G2581 Restless legs syndrome: Secondary | ICD-10-CM | POA: Diagnosis not present

## 2017-02-06 DIAGNOSIS — I69398 Other sequelae of cerebral infarction: Secondary | ICD-10-CM

## 2017-02-06 DIAGNOSIS — R791 Abnormal coagulation profile: Secondary | ICD-10-CM | POA: Diagnosis not present

## 2017-02-06 DIAGNOSIS — E1142 Type 2 diabetes mellitus with diabetic polyneuropathy: Secondary | ICD-10-CM | POA: Diagnosis not present

## 2017-02-06 DIAGNOSIS — R310 Gross hematuria: Secondary | ICD-10-CM | POA: Diagnosis not present

## 2017-02-06 DIAGNOSIS — I69392 Facial weakness following cerebral infarction: Secondary | ICD-10-CM | POA: Diagnosis not present

## 2017-02-06 DIAGNOSIS — Z992 Dependence on renal dialysis: Secondary | ICD-10-CM | POA: Diagnosis not present

## 2017-02-06 DIAGNOSIS — Z7982 Long term (current) use of aspirin: Secondary | ICD-10-CM

## 2017-02-06 DIAGNOSIS — N401 Enlarged prostate with lower urinary tract symptoms: Secondary | ICD-10-CM | POA: Diagnosis present

## 2017-02-06 DIAGNOSIS — I69328 Other speech and language deficits following cerebral infarction: Secondary | ICD-10-CM | POA: Diagnosis not present

## 2017-02-06 DIAGNOSIS — Z85820 Personal history of malignant melanoma of skin: Secondary | ICD-10-CM | POA: Diagnosis not present

## 2017-02-06 DIAGNOSIS — I10 Essential (primary) hypertension: Secondary | ICD-10-CM | POA: Diagnosis not present

## 2017-02-06 DIAGNOSIS — I4891 Unspecified atrial fibrillation: Secondary | ICD-10-CM | POA: Diagnosis present

## 2017-02-06 DIAGNOSIS — I69322 Dysarthria following cerebral infarction: Secondary | ICD-10-CM | POA: Diagnosis not present

## 2017-02-06 DIAGNOSIS — I951 Orthostatic hypotension: Secondary | ICD-10-CM | POA: Diagnosis not present

## 2017-02-06 DIAGNOSIS — Z7983 Long term (current) use of bisphosphonates: Secondary | ICD-10-CM

## 2017-02-06 DIAGNOSIS — I120 Hypertensive chronic kidney disease with stage 5 chronic kidney disease or end stage renal disease: Secondary | ICD-10-CM | POA: Diagnosis present

## 2017-02-06 DIAGNOSIS — E0821 Diabetes mellitus due to underlying condition with diabetic nephropathy: Secondary | ICD-10-CM

## 2017-02-06 DIAGNOSIS — Z888 Allergy status to other drugs, medicaments and biological substances status: Secondary | ICD-10-CM

## 2017-02-06 DIAGNOSIS — Z833 Family history of diabetes mellitus: Secondary | ICD-10-CM

## 2017-02-06 DIAGNOSIS — Z950 Presence of cardiac pacemaker: Secondary | ICD-10-CM | POA: Diagnosis not present

## 2017-02-06 DIAGNOSIS — Z79899 Other long term (current) drug therapy: Secondary | ICD-10-CM

## 2017-02-06 DIAGNOSIS — R2689 Other abnormalities of gait and mobility: Secondary | ICD-10-CM | POA: Diagnosis present

## 2017-02-06 DIAGNOSIS — E1151 Type 2 diabetes mellitus with diabetic peripheral angiopathy without gangrene: Secondary | ICD-10-CM | POA: Diagnosis present

## 2017-02-06 DIAGNOSIS — R269 Unspecified abnormalities of gait and mobility: Secondary | ICD-10-CM | POA: Diagnosis not present

## 2017-02-06 LAB — CBC
HCT: 29 % — ABNORMAL LOW (ref 39.0–52.0)
Hemoglobin: 9.5 g/dL — ABNORMAL LOW (ref 13.0–17.0)
MCH: 30.3 pg (ref 26.0–34.0)
MCHC: 32.8 g/dL (ref 30.0–36.0)
MCV: 92.4 fL (ref 78.0–100.0)
Platelets: 185 10*3/uL (ref 150–400)
RBC: 3.14 MIL/uL — ABNORMAL LOW (ref 4.22–5.81)
RDW: 14.1 % (ref 11.5–15.5)
WBC: 9.9 10*3/uL (ref 4.0–10.5)

## 2017-02-06 LAB — GLUCOSE, CAPILLARY
GLUCOSE-CAPILLARY: 203 mg/dL — AB (ref 65–99)
GLUCOSE-CAPILLARY: 140 mg/dL — AB (ref 65–99)
GLUCOSE-CAPILLARY: 174 mg/dL — AB (ref 65–99)
GLUCOSE-CAPILLARY: 216 mg/dL — AB (ref 65–99)
Glucose-Capillary: 132 mg/dL — ABNORMAL HIGH (ref 65–99)

## 2017-02-06 LAB — RENAL FUNCTION PANEL
ALBUMIN: 2.6 g/dL — AB (ref 3.5–5.0)
Anion gap: 11 (ref 5–15)
BUN: 68 mg/dL — AB (ref 6–20)
CO2: 23 mmol/L (ref 22–32)
Calcium: 8.5 mg/dL — ABNORMAL LOW (ref 8.9–10.3)
Chloride: 100 mmol/L — ABNORMAL LOW (ref 101–111)
Creatinine, Ser: 6.47 mg/dL — ABNORMAL HIGH (ref 0.61–1.24)
GFR calc Af Amer: 8 mL/min — ABNORMAL LOW (ref >60)
GFR calc non Af Amer: 7 mL/min — ABNORMAL LOW (ref >60)
GLUCOSE: 192 mg/dL — AB (ref 65–99)
PHOSPHORUS: 4.9 mg/dL — AB (ref 2.5–4.6)
Potassium: 4.2 mmol/L (ref 3.5–5.1)
SODIUM: 134 mmol/L — AB (ref 135–145)

## 2017-02-06 LAB — PROTIME-INR
INR: 1.98
Prothrombin Time: 22.8 seconds — ABNORMAL HIGH (ref 11.4–15.2)

## 2017-02-06 MED ORDER — CALCIUM ACETATE (PHOS BINDER) 667 MG PO CAPS
1334.0000 mg | ORAL_CAPSULE | Freq: Three times a day (TID) | ORAL | Status: DC
  Administered 2017-02-06 – 2017-02-14 (×25): 1334 mg via ORAL
  Filled 2017-02-06 (×25): qty 2

## 2017-02-06 MED ORDER — ASPIRIN EC 81 MG PO TBEC
81.0000 mg | DELAYED_RELEASE_TABLET | Freq: Every day | ORAL | Status: DC
  Administered 2017-02-07 – 2017-02-15 (×9): 81 mg via ORAL
  Filled 2017-02-06 (×9): qty 1

## 2017-02-06 MED ORDER — IPRATROPIUM BROMIDE 0.06 % NA SOLN
2.0000 | Freq: Every day | NASAL | Status: DC
  Administered 2017-02-07 – 2017-02-14 (×8): 2 via NASAL
  Filled 2017-02-06: qty 15

## 2017-02-06 MED ORDER — CARVEDILOL 3.125 MG PO TABS
3.1250 mg | ORAL_TABLET | Freq: Two times a day (BID) | ORAL | Status: DC
  Administered 2017-02-06 – 2017-02-15 (×18): 3.125 mg via ORAL
  Filled 2017-02-06 (×18): qty 1

## 2017-02-06 MED ORDER — POLYVINYL ALCOHOL 1.4 % OP SOLN
2.0000 [drp] | Freq: Every day | OPHTHALMIC | Status: DC | PRN
  Administered 2017-02-13: 2 [drp] via OPHTHALMIC
  Filled 2017-02-06: qty 15

## 2017-02-06 MED ORDER — DARBEPOETIN ALFA 60 MCG/0.3ML IJ SOSY
60.0000 ug | PREFILLED_SYRINGE | INTRAMUSCULAR | Status: DC
  Administered 2017-02-10: 60 ug via INTRAVENOUS
  Filled 2017-02-06: qty 0.3

## 2017-02-06 MED ORDER — LIDOCAINE HCL (PF) 1 % IJ SOLN: 5.0000 mL | INTRAMUSCULAR | Status: DC | PRN

## 2017-02-06 MED ORDER — ALFUZOSIN HCL ER 10 MG PO TB24: 10.0000 mg | ORAL_TABLET | Freq: Every day | ORAL | Status: DC

## 2017-02-06 MED ORDER — WARFARIN SODIUM 7.5 MG PO TABS
7.5000 mg | ORAL_TABLET | Freq: Once | ORAL | Status: AC
  Administered 2017-02-06: 7.5 mg via ORAL
  Filled 2017-02-06: qty 1

## 2017-02-06 MED ORDER — HEPARIN SODIUM (PORCINE) 1000 UNIT/ML DIALYSIS
1000.0000 [IU] | INTRAMUSCULAR | Status: DC | PRN
Start: 2017-02-06 — End: 2017-02-06

## 2017-02-06 MED ORDER — EZETIMIBE 10 MG PO TABS
10.0000 mg | ORAL_TABLET | Freq: Every day | ORAL | Status: DC
  Administered 2017-02-07 – 2017-02-15 (×9): 10 mg via ORAL
  Filled 2017-02-06 (×9): qty 1

## 2017-02-06 MED ORDER — INSULIN ASPART 100 UNIT/ML ~~LOC~~ SOLN
0.0000 [IU] | Freq: Every day | SUBCUTANEOUS | Status: DC
  Administered 2017-02-06: 2 [IU] via SUBCUTANEOUS

## 2017-02-06 MED ORDER — FENOFIBRATE 160 MG PO TABS
160.0000 mg | ORAL_TABLET | Freq: Every day | ORAL | Status: DC
  Administered 2017-02-07 – 2017-02-15 (×9): 160 mg via ORAL
  Filled 2017-02-06 (×9): qty 1

## 2017-02-06 MED ORDER — FINASTERIDE 5 MG PO TABS
5.0000 mg | ORAL_TABLET | Freq: Every day | ORAL | Status: DC
  Administered 2017-02-07 – 2017-02-15 (×9): 5 mg via ORAL
  Filled 2017-02-06 (×9): qty 1

## 2017-02-06 MED ORDER — PRO-STAT SUGAR FREE PO LIQD: 30.0000 mL | Freq: Two times a day (BID) | ORAL | 0 refills | Status: DC

## 2017-02-06 MED ORDER — PENTAFLUOROPROP-TETRAFLUOROETH EX AERO: 1.0000 "application " | INHALATION_SPRAY | CUTANEOUS | Status: DC | PRN

## 2017-02-06 MED ORDER — ACETAMINOPHEN 325 MG PO TABS
650.0000 mg | ORAL_TABLET | ORAL | Status: DC | PRN
  Administered 2017-02-06 – 2017-02-13 (×6): 650 mg via ORAL
  Filled 2017-02-06 (×7): qty 2

## 2017-02-06 MED ORDER — RENA-VITE PO TABS
1.0000 | ORAL_TABLET | Freq: Every day | ORAL | Status: DC
  Administered 2017-02-06 – 2017-02-14 (×9): 1 via ORAL
  Filled 2017-02-06 (×9): qty 1

## 2017-02-06 MED ORDER — SORBITOL 70 % SOLN
30.0000 mL | Freq: Every day | Status: DC | PRN
  Administered 2017-02-08: 30 mL via ORAL
  Filled 2017-02-06: qty 30

## 2017-02-06 MED ORDER — WARFARIN - PHARMACIST DOSING INPATIENT
Freq: Every day | Status: DC
  Administered 2017-02-06: 1

## 2017-02-06 MED ORDER — ONDANSETRON HCL 4 MG PO TABS: 4.0000 mg | ORAL_TABLET | Freq: Four times a day (QID) | ORAL | Status: DC | PRN

## 2017-02-06 MED ORDER — WARFARIN SODIUM 2.5 MG PO TABS: 2.5000 mg | ORAL_TABLET | Freq: Every day | ORAL | 0 refills | Status: DC

## 2017-02-06 MED ORDER — DOXERCALCIFEROL 4 MCG/2ML IV SOLN
4.0000 ug | INTRAVENOUS | Status: DC
  Administered 2017-02-11 – 2017-02-15 (×3): 4 ug via INTRAVENOUS
  Filled 2017-02-06 (×4): qty 2

## 2017-02-06 MED ORDER — ACETAMINOPHEN 650 MG RE SUPP: 650.0000 mg | RECTAL | Status: DC | PRN

## 2017-02-06 MED ORDER — ONDANSETRON HCL 4 MG/2ML IJ SOLN: 4.0000 mg | Freq: Four times a day (QID) | INTRAMUSCULAR | Status: DC | PRN

## 2017-02-06 MED ORDER — ACETAMINOPHEN 160 MG/5ML PO SOLN
650.0000 mg | ORAL | Status: DC | PRN
  Filled 2017-02-06: qty 20.3

## 2017-02-06 MED ORDER — RENA-VITE PO TABS: 1.0000 | ORAL_TABLET | Freq: Every day | ORAL | 0 refills | Status: DC

## 2017-02-06 MED ORDER — WARFARIN SODIUM 5 MG PO TABS: 7.5000 mg | ORAL_TABLET | Freq: Once | ORAL | Status: DC

## 2017-02-06 MED ORDER — SODIUM CHLORIDE 0.9 % IV SOLN: 100.0000 mL | INTRAVENOUS | Status: DC | PRN

## 2017-02-06 MED ORDER — PRO-STAT SUGAR FREE PO LIQD
30.0000 mL | Freq: Two times a day (BID) | ORAL | Status: DC
  Administered 2017-02-06 – 2017-02-14 (×17): 30 mL via ORAL
  Filled 2017-02-06 (×17): qty 30

## 2017-02-06 MED ORDER — CAMPHOR-MENTHOL 0.5-0.5 % EX LOTN: TOPICAL_LOTION | CUTANEOUS | Status: DC | PRN

## 2017-02-06 MED ORDER — DOXERCALCIFEROL 4 MCG/2ML IV SOLN
INTRAVENOUS | Status: DC
Start: 2017-02-06 — End: 2017-02-06
  Filled 2017-02-06: qty 2

## 2017-02-06 MED ORDER — LIDOCAINE-PRILOCAINE 2.5-2.5 % EX CREA: 1.0000 "application " | TOPICAL_CREAM | CUTANEOUS | Status: DC | PRN

## 2017-02-06 MED ORDER — CARVEDILOL 3.125 MG PO TABS
3.1250 mg | ORAL_TABLET | Freq: Two times a day (BID) | ORAL | Status: DC
Start: 2017-02-06 — End: 2017-02-14

## 2017-02-06 NOTE — Progress Notes (Signed)
Pt A/O, no noted pain or distress. LUE skin tear upper/hand. BUE scattered bruising. No other skin issues. Toney Rakes, RN second nurse to assess skin. PNA vaccine administered 2016/Flu 2017. Pt able to make needs known. Pt notes his home does not have stairs. He has reading glasses at bedside. Staff will continue to monitor and meet needs.

## 2017-02-06 NOTE — Clinical Social Work Note (Signed)
Patient discharging to CIR today.  CSW signing off.   Adelena Desantiago, CSW [phone]  

## 2017-02-06 NOTE — Progress Notes (Signed)
ANTICOAGULATION CONSULT NOTE - FOLLOW UP  Pharmacy Consult:  Coumadin Indication: atrial fibrillation in setting of acute stroke/TIA   Allergies  Allergen Reactions  . Amlodipine Besylate Swelling    angioedema  . Lisinopril Other (See Comments)    Renal insufficiency  . Statins Other (See Comments)    Muscle aches    Patient Measurements: Height: 5\' 10"  (177.8 cm) Weight: 176 lb 2.4 oz (79.9 kg) IBW/kg (Calculated) : 73  Vital Signs: Temp: 97.9 F (36.6 C) (05/24 1150) Temp Source: Oral (05/24 1150) BP: 142/60 (05/24 1150) Pulse Rate: 93 (05/24 1150)  Labs:  Recent Labs  02/04/17 0424 02/05/17 0230 02/06/17 0301 02/06/17 0800  HGB 9.4* 9.5*  --  9.5*  HCT 29.5* 29.7*  --  29.0*  PLT 184 184  --  185  LABPROT 28.4* 23.7* 22.8*  --   INR 2.60 2.08 1.98  --   CREATININE 7.08*  --   --  6.47*    Estimated Creatinine Clearance: 9.7 mL/min (A) (by C-G formula based on SCr of 6.47 mg/dL (H)).   Assessment: 33 YOM with history of Aflutter.  He has not been on anticoagulation due to low risk for stroke.  Here with acute stroke/TIA.  INR  = 1.98  Goal of Therapy:  INR 2-3  Plan:  Warfarin 7.5 mg x 1 Daily INR  Levester Fresh, PharmD, BCPS, BCCCP Clinical Pharmacist Clinical phone for 02/06/2017 from 7a-3:30p: [phone] If after 3:30p, please call main pharmacy at: [phone] 02/06/2017 12:43 PM

## 2017-02-06 NOTE — Progress Notes (Signed)
Rehab admissions - I have approval for acute inpatient rehab admission for today.  Patient currently in HD.  I spoke with his wife by phone and she is agreeable to inpatient rehab admission.  I spoke with attending MD and he has cleared patient for admit to inpatient rehab later today.  Bed available and will admit to inpatient rehab today.  Call me for questions.  #[phone]

## 2017-02-06 NOTE — Discharge Summary (Signed)
Physician Discharge Summary  James David:[id number] DOB: October 14, 1935 DOA: 01/29/2017  PCP: [name], MD  Admit date: 01/29/2017 Discharge date: 02/06/2017  Admitted From: HOME Disposition:CIR  Recommendations for Outpatient Follow-up:  1. Follow up with PCP and urologist in 1-2 weeks 2. Please obtain BMP/CBC in one week   Home Health:CIR Equipment/Devices:none Discharge Condition:stable CODE STATUS:full Diet recommendation: crab modified heart healthy diet  Brief/Interim Summary:81 year old male with a past medical history of atrial flutter, followed by cardiology, but was not on anticoagulation, history of pacemaker placement, history of chronic kidney disease, who started dialysis recently, diabetes mellitus, hypertension, presented with sudden onset of diaphoresis, dizziness, left arm weakness and slurred speech. Concern was for TIA versus stroke. Patient was hospitalized for further management. Warfarin was initiated. Patient had issues with hematuria. He also had acute urinary retention requiring Foley placement. He had an episode of syncope in the hospital. He once again developed left arm weakness when he was ambulated by physical therapy.  # Possible small subcortical versus brainstem stroke concerning for embolic secondary due to a atrial flutter not on anticoagulation: -CT head with a small vessel disease -CT head and neck shows diffuse stenosis. No MRA MRI because of pacemaker. Carotid Doppler ultrasound unremarkable. Echo with EF of 65-70%. LDL 99. A1c 7.0. -Evaluated by stroke neurologist. Recommended Coumadin and aspirin 81. -PT OT evaluated the patient.Discussed with the inpatient rehabilitation team today, patient is accepted. He'll be discharged to inpatient rehabilitation for further rehabilitation and therapies. -Stroke neurologist recommended to follow-up with Dr. Erlinda Hong at [facility] in 6 weeks.  # Syncope likely due to orthostatic hypotension: Evaluated by  cardiology. Pacemaker was interrogated. Coreg was briefly held however cardiology recommended to resume lower dose. Clinically improved. Tamsulosin has been changed to alfuzosin. Tolerated lower dose of Coreg with improvement in symptoms.  #Hematuria/acute urinary retention: Evaluated by urology. Hematuria likely due to history of prostate enlargement. Plan is to leave the Foley catheter for now. Outpatient follow-up in one week with his urologist Dr.Wrenn. this can be followed up by urologist at inpatient rehabilitation. Continue Alfuzosin and finasteride.  #ESRD on hemodialysis as per nephrology.  #Incidental parathyroid adenoma and thyroid nodule: TSH is acceptable. Recommended outpatient follow-up with PCP and likely with endocrinologist.  #History of essential hypertension: Blood pressure is elevated with tachycardia. Cardiology recommended resuming Coreg. Low-dose Coreg tolerated with improvement in heart rate and blood pressure.  #Anemia of chronic kidney disease: Monitor CBC.  #History of atrial flutter: Now on Coumadin. Monitor INR. Continue aspirin. Recommended to monitor INR tomorrow and adjust the dose.  #History of pacemaker placement  Type 2 diabetes: Continue blood sugar level monitoring. Resume home medication and recommended to follow up with PCP on discharge.  Patient is clinically stable. He will be discharged to inpatient rehabilitation for further care. On aspirin and Coumadin. Needs to follow up with urologist, urologist.  Discharge Diagnoses:  Principal Problem:   TIA (transient ischemic attack) Active Problems:   Hypertension   CKD (chronic kidney disease) stage V requiring chronic dialysis (HCC)   DDD (degenerative disc disease)   HLD (hyperlipidemia)   Second degree heart block   Atrial flutter (HCC)   ESRD (end stage renal disease) on dialysis ([place])   Stroke [facility])   Syncope   Orthostatic hypotension   Intracranial vascular stenosis    Discharge  Instructions  Discharge Instructions    Ambulatory referral to Neurology    Complete by:  As directed    Follow up with stroke  clinic Cecille Rubin preferred, if not available, then consider [facility] Chestnut, [facility] or Jaynee Eagles whoever is available) at [facility] in about 6-8 weeks. Thanks.   Call MD for:  difficulty breathing, headache or visual disturbances    Complete by:  As directed    Call MD for:  extreme fatigue    Complete by:  As directed    Call MD for:  hives    Complete by:  As directed    Call MD for:  persistant dizziness or light-headedness    Complete by:  As directed    Call MD for:  persistant nausea and vomiting    Complete by:  As directed    Call MD for:  severe uncontrolled pain    Complete by:  As directed    Call MD for:  temperature >100.4    Complete by:  As directed    Diet - low sodium heart healthy    Complete by:  As directed    Diet Carb Modified    Complete by:  As directed    Discharge instructions    Complete by:  As directed    Please check INR on 5/25 and adjust the coumadin dose. Follow up with your urologist, cardiologist, nephrologist and PCP.   Increase activity slowly    Complete by:  As directed      Allergies as of 02/06/2017      Reactions   Amlodipine Besylate Swelling   angioedema   Lisinopril Other (See Comments)   Renal insufficiency   Statins Other (See Comments)   Muscle aches      Medication List    STOP taking these medications   furosemide 80 MG tablet Commonly known as:  LASIX   tamsulosin 0.4 MG Caps capsule Commonly known as:  FLOMAX     TAKE these medications   ACCU-CHEK AVIVA PLUS test strip Generic drug:  glucose blood   acetaminophen 500 MG tablet Commonly known as:  TYLENOL Take 500 mg by mouth daily as needed for mild pain. (pain/headaches)   alfuzosin 10 MG 24 hr tablet Commonly known as:  UROXATRAL Take 1 tablet (10 mg total) by mouth daily with breakfast.   ARTIFICIAL TEARS OP Place 1 drop into both  eyes daily as needed (supplement).   aspirin EC 81 MG tablet Take 81 mg by mouth at bedtime.   calcium acetate 667 MG capsule Commonly known as:  PHOSLO Take 2 capsules by mouth 3 (three) times daily.   carvedilol 3.125 MG tablet Commonly known as:  COREG Take 1 tablet (3.125 mg total) by mouth 2 (two) times daily with a meal. What changed:  medication strength  how much to take   ezetimibe 10 MG tablet Commonly known as:  ZETIA Take 10 mg by mouth daily.   feeding supplement (PRO-STAT SUGAR FREE 64) Liqd Take 30 mLs by mouth 2 (two) times daily.   fenofibrate 160 MG tablet Take 160 mg by mouth daily.   finasteride 5 MG tablet Commonly known as:  PROSCAR Take 5 mg by mouth daily.   glimepiride 4 MG tablet Commonly known as:  AMARYL Take 4 mg by mouth 2 (two) times daily.   ipratropium 0.03 % nasal spray Commonly known as:  ATROVENT Place 2 sprays into both nostrils daily.   multivitamin Tabs tablet Take 1 tablet by mouth at bedtime.   warfarin 2.5 MG tablet Commonly known as:  COUMADIN Take 1 tablet (2.5 mg total) by mouth daily at 6 PM.  Adjust dose as per INR result. Maintain INR 2-3       Contact information for follow-up providers    Dennie Bible, NP. Schedule an appointment as soon as possible for a visit in 6 week(s).   Specialty:  Family Medicine Why:  clinic will call you for appointment Contact information: [address] [place] [address] [phone]        [name], MD Follow up.   Specialty:  Family Medicine Contact information: [place] [address] [phone]        Irine Seal, MD. Schedule an appointment as soon as possible for a visit in 1 week(s).   Specialty:  Urology Contact information: [place] [address] [phone]            Contact information for after-discharge care    Destination    [facility] Follow up.    Specialty:  Skilled Nursing Facility Contact information: [place] [phone]                 Allergies  Allergen Reactions  . Amlodipine Besylate Swelling    angioedema  . Lisinopril Other (See Comments)    Renal insufficiency  . Statins Other (See Comments)    Muscle aches    Consultations:  Neurology  Cardiology  Nephrology  Procedures/Studies: CT scan of the head, echo, carotid Doppler.  Subjective: Seen and examined at dialysis unit.  Denied headache, dizziness, nausea, vomiting, chest pain or shortness of breath. Discussed about inpatient rehabilitation admission and he agreed.  Discharge Exam: Vitals:   02/06/17 1030 02/06/17 1100  BP: 109/60 127/68  Pulse: (!) 108 98  Resp:    Temp:     Vitals:   02/06/17 0930 02/06/17 1000 02/06/17 1030 02/06/17 1100  BP: 136/62 125/90 109/60 127/68  Pulse: 100 (!) 108 (!) 108 98  Resp:      Temp:      TempSrc:      SpO2:      Weight:      Height:        General: Pt is alert, awake, not in acute distress Cardiovascular: RRR, S1/S2 +, no rubs, no gallops Respiratory: CTA bilaterally, no wheezing, no rhonchi Abdominal: Soft, NT, ND, bowel sounds + Extremities: no edema, no cyanosis Foley catheter with clear urine.   The results of significant diagnostics from this hospitalization (including imaging, microbiology, ancillary and laboratory) are listed below for reference.     Microbiology: Recent Results (from the past 240 hour(s))  Culture, Urine     Status: Abnormal   Collection Time: 01/31/17 11:34 AM  Result Value Ref Range Status   Specimen Description URINE, CLEAN CATCH  Final   Special Requests NONE  Final   Culture MULTIPLE SPECIES PRESENT, SUGGEST RECOLLECTION (A)  Final   Report Status 02/01/2017 FINAL  Final  MRSA PCR Screening     Status: None   Collection Time: 02/02/17  6:00 PM  Result Value Ref Range Status   MRSA by PCR NEGATIVE  NEGATIVE Final    Comment:        The GeneXpert MRSA Assay (FDA approved for NASAL specimens only), is one component of a comprehensive MRSA colonization surveillance program. It is not intended to diagnose MRSA infection nor to guide or monitor treatment for MRSA infections.      Labs: BNP (last 3 results) No results for input(s): BNP  in the last 8760 hours. Basic Metabolic Panel:  Recent Labs Lab 02/01/17 0419 02/02/17 0412 02/03/17 0227 02/04/17 0424 02/06/17 0800  NA 134* 133* 137 136 134*  K 4.2 3.7 3.7 4.0 4.2  CL 97* 96* 99* 101 100*  CO2 26 28 27 24 23   GLUCOSE 154* 199* 154* 185* 192*  BUN 56* 38* 58* 83* 68*  CREATININE 6.25* 4.69* 6.12* 7.08* 6.47*  CALCIUM 8.8* 8.7* 8.5* 8.2* 8.5*  PHOS  --   --   --  5.3* 4.9*   Liver Function Tests:  Recent Labs Lab 02/04/17 0424 02/06/17 0800  ALBUMIN 2.4* 2.6*   No results for input(s): LIPASE, AMYLASE in the last 168 hours. No results for input(s): AMMONIA in the last 168 hours. CBC:  Recent Labs Lab 02/02/17 0412 02/03/17 0227 02/04/17 0424 02/05/17 0230 02/06/17 0800  WBC 12.3* 12.3* 10.4 10.8* 9.9  HGB 10.4* 10.0* 9.4* 9.5* 9.5*  HCT 31.9* 30.9* 29.5* 29.7* 29.0*  MCV 91.4 92.2 92.8 92.2 92.4  PLT 172 173 184 184 185   Cardiac Enzymes: No results for input(s): CKTOTAL, CKMB, CKMBINDEX, TROPONINI in the last 168 hours. BNP: Invalid input(s): POCBNP CBG:  Recent Labs Lab 02/04/17 2132 02/05/17 0745 02/05/17 1515 02/05/17 1635 02/05/17 2112  GLUCAP 123* 175* 315* 247* 203*   D-Dimer No results for input(s): DDIMER in the last 72 hours. Hgb A1c No results for input(s): HGBA1C in the last 72 hours. Lipid Profile No results for input(s): CHOL, HDL, LDLCALC, TRIG, CHOLHDL, LDLDIRECT in the last 72 hours. Thyroid function studies  Recent Labs  02/05/17 0230  TSH 0.699   Anemia work up No results for input(s): VITAMINB12, FOLATE, FERRITIN, TIBC, IRON, RETICCTPCT in the last 72  hours. Urinalysis    Component Value Date/Time   COLORURINE YELLOW 01/31/2017 1133   APPEARANCEUR CLEAR 01/31/2017 1133   LABSPEC 1.008 01/31/2017 1133   PHURINE 8.0 01/31/2017 1133   GLUCOSEU >=500 (A) 01/31/2017 1133   HGBUR NEGATIVE 01/31/2017 1133   BILIRUBINUR NEGATIVE 01/31/2017 1133   KETONESUR 5 (A) 01/31/2017 1133   PROTEINUR >=300 (A) 01/31/2017 1133   NITRITE NEGATIVE 01/31/2017 1133   LEUKOCYTESUR NEGATIVE 01/31/2017 1133   Sepsis Labs Invalid input(s): PROCALCITONIN,  WBC,  LACTICIDVEN Microbiology Recent Results (from the past 240 hour(s))  Culture, Urine     Status: Abnormal   Collection Time: 01/31/17 11:34 AM  Result Value Ref Range Status   Specimen Description URINE, CLEAN CATCH  Final   Special Requests NONE  Final   Culture MULTIPLE SPECIES PRESENT, SUGGEST RECOLLECTION (A)  Final   Report Status 02/01/2017 FINAL  Final  MRSA PCR Screening     Status: None   Collection Time: 02/02/17  6:00 PM  Result Value Ref Range Status   MRSA by PCR NEGATIVE NEGATIVE Final    Comment:        The GeneXpert MRSA Assay (FDA approved for NASAL specimens only), is one component of a comprehensive MRSA colonization surveillance program. It is not intended to diagnose MRSA infection nor to guide or monitor treatment for MRSA infections.      Time coordinating discharge: 35 minutes  SIGNED:   [name] Fire, MD  [facility] 02/06/2017, 11:19 AM  If 7PM-7AM, please contact night-coverage [email or web address] Password [email or web address]

## 2017-02-06 NOTE — Progress Notes (Signed)
[facility] Progress Note   [facility] Rounding Note  Subjective:   Still with the fidgets and some RLS symptoms Some SOB  SEEN ON HD R AVF 350 PRE WEIGHT 81.4 UF GOAL 2 LITERS BP STABLE 150'S SO FAR   Objective Vitals:   02/05/17 1913 02/05/17 2252 02/06/17 0514 02/06/17 0714  BP: (!) 157/71 (!) 146/68 (!) 154/76 (!) 150/83  Pulse: (!) 103 92 100 95  Resp: 12 (!) 24 11 16   Temp: 98.2 F (36.8 C) 98.3 F (36.8 C) 98 F (36.7 C) 98.1 F (36.7 C)  TempSrc: Oral Oral Oral Oral  SpO2: 100% 98% 100% 100%  Weight:      Height:       Physical Exam Very nice bearded WM NAD Seen in HD Face symmetric, speech fine No JVD Lungs with decreased BS but clear S1S2 No S3 Abd soft and not tender R AVF cannulated - lots of bruising from earlier treatments No sig LE edema  Recent Labs Lab 02/02/17 0412 02/03/17 0227 02/04/17 0424  NA 133* 137 136  K 3.7 3.7 4.0  CL 96* 99* 101  CO2 28 27 24   GLUCOSE 199* 154* 185*  BUN 38* 58* 83*  CREATININE 4.69* 6.12* 7.08*  CALCIUM 8.7* 8.5* 8.2*  PHOS  --   --  5.3*    Recent Labs Lab 02/04/17 0424  ALBUMIN 2.4*    Recent Labs Lab 02/02/17 0412 02/03/17 0227 02/04/17 0424 02/05/17 0230 02/06/17 0800  WBC 12.3* 12.3* 10.4 10.8* 9.9  HGB 10.4* 10.0* 9.4* 9.5* 9.5*  HCT 31.9* 30.9* 29.5* 29.7* 29.0*  MCV 91.4 92.2 92.8 92.2 92.4  PLT 172 173 184 184 185    Recent Labs Lab 02/04/17 2132 02/05/17 0745 02/05/17 1515 02/05/17 1635 02/05/17 2112  GLUCAP 123* 175* 315* 247* 203*    Lab Results  Component Value Date   INR 1.98 02/06/2017   INR 2.08 02/05/2017   INR 2.60 02/04/2017    Ct Angio Head W Or Wo Contrast  Result Date: 02/04/2017 CLINICAL DATA:  Syncope yesterday after going to the bathroom. Brief left arm weakness. EXAM: CT ANGIOGRAPHY HEAD AND NECK TECHNIQUE: Multidetector CT imaging of the head and neck was performed using the standard protocol during bolus administration of intravenous  contrast. Multiplanar CT image reconstructions and MIPs were obtained to evaluate the vascular anatomy. Carotid stenosis measurements (when applicable) are obtained utilizing NASCET criteria, using the distal internal carotid diameter as the denominator. CONTRAST:  50 cc Isovue 370 intravenous COMPARISON:  Head CT from 2 days prior FINDINGS: CT HEAD FINDINGS Brain: No evidence of acute infarction, hemorrhage, hydrocephalus, extra-axial collection or mass lesion/mass effect. Chronic small vessel ischemia in the cerebral white matter with remote lacunar infarct of the left caudate head. Vascular: See below Skull: No acute or aggressive finding Sinuses/orbits: Bilateral cataract resection.  No acute finding. Review of the MIP images confirms the above findings CTA NECK FINDINGS Aortic arch: Atherosclerotic plaque of the aortic arch. Two vessel branching. No acute finding Right carotid system: Mild atheromatous calcified plaque at the common carotid bifurcation. Mild atheromatous wall thickening at the ICA bulb. No stenosis, ulceration, or dissection. Left carotid system: Moderate mixed density atherosclerotic plaque the common carotid bifurcation and ICA bulb with no flow limiting stenosis, ulceration, or dissection. Vertebral arteries: No proximal subclavian stenosis. There is severe atheromatous narrowing of the right vertebral artery origin. Moderate narrowing at the left vertebral artery origin. Intermittent likely atheromatous narrowings of the right V2  and V3 segment. The right vertebral artery is patent to the dura. Better patency of the slightly dominant left vertebral artery. Skeleton: No acute or aggressive finding Other neck: 14 mm dense/hypervascular nodule in the right tracheoesophageal groove with probable polar vessel. Bilateral thyroid nodules, largest in the right upper pole measuring 25 mm. Probable cervicothoracic diffuse idiopathic skeletal hyperostosis. Multilevel ankylosis. Simple suboccipital  lipoma. Upper chest: Negative Review of the MIP images confirms the above findings CTA HEAD FINDINGS Anterior circulation: Atherosclerosis of both carotid siphons with at least moderate right right para clinoid segment stenosis. Moderate narrowing of the left supraclinoid ICA. Hypoplastic right A1 segment with superimposed proximal narrowing. No major branch occlusion is noted. Notable moderate narrowings of the left A2 segment. There is diffuse atheromatous irregularity of bilateral MCA branches. Posterior circulation: Faint intermittently seen flow in the right vertebral artery to the level of the basilar. A right PICA is not seen but there is a sizable right AICA that is filling. The basilar is small in the setting of left fetal type PCA. There is superimposed moderate to advanced narrowing at the midportion. Extensive atheromatous irregularity of bilateral PCA branches. Venous sinuses: Patent Anatomic variants: None significant Delayed phase: No abnormal intracranial enhancement Review of the MIP images confirms the above findings IMPRESSION: 1. Severely depressed flow in the right V4 segment likely due to stenosis at the dura. Small basilar in the setting of fetal type left PCA, with superimposed moderate to advanced mid basilar stenosis. 2. Bilateral intracranial ICA stenosis that is moderate to advanced. 3. Advanced diffuse atherosclerotic irregularity of medium size intracranial vessels. 4. Severe right and moderate left vertebral origin stenosis. 5. No flow limiting stenosis in the atherosclerotic cervical carotid circulation. 6. 14 mm mass in the right tracheoesophageal groove with features of parathyroid adenoma. Correlate with this calcium labs. 7. Bilateral thyroid nodules measuring up to 25 mm on the right. Electronically Signed   By: Monte Fantasia M.D.   On: 02/04/2017 10:26   Ct Angio Neck W Or Wo Contrast  Result Date: 02/04/2017 CLINICAL DATA:  Syncope yesterday after going to the bathroom.  Brief left arm weakness. EXAM: CT ANGIOGRAPHY HEAD AND NECK TECHNIQUE: Multidetector CT imaging of the head and neck was performed using the standard protocol during bolus administration of intravenous contrast. Multiplanar CT image reconstructions and MIPs were obtained to evaluate the vascular anatomy. Carotid stenosis measurements (when applicable) are obtained utilizing NASCET criteria, using the distal internal carotid diameter as the denominator. CONTRAST:  50 cc Isovue 370 intravenous COMPARISON:  Head CT from 2 days prior FINDINGS: CT HEAD FINDINGS Brain: No evidence of acute infarction, hemorrhage, hydrocephalus, extra-axial collection or mass lesion/mass effect. Chronic small vessel ischemia in the cerebral white matter with remote lacunar infarct of the left caudate head. Vascular: See below Skull: No acute or aggressive finding Sinuses/orbits: Bilateral cataract resection.  No acute finding. Review of the MIP images confirms the above findings CTA NECK FINDINGS Aortic arch: Atherosclerotic plaque of the aortic arch. Two vessel branching. No acute finding Right carotid system: Mild atheromatous calcified plaque at the common carotid bifurcation. Mild atheromatous wall thickening at the ICA bulb. No stenosis, ulceration, or dissection. Left carotid system: Moderate mixed density atherosclerotic plaque the common carotid bifurcation and ICA bulb with no flow limiting stenosis, ulceration, or dissection. Vertebral arteries: No proximal subclavian stenosis. There is severe atheromatous narrowing of the right vertebral artery origin. Moderate narrowing at the left vertebral artery origin. Intermittent likely atheromatous narrowings of the  right V2 and V3 segment. The right vertebral artery is patent to the dura. Better patency of the slightly dominant left vertebral artery. Skeleton: No acute or aggressive finding Other neck: 14 mm dense/hypervascular nodule in the right tracheoesophageal groove with probable  polar vessel. Bilateral thyroid nodules, largest in the right upper pole measuring 25 mm. Probable cervicothoracic diffuse idiopathic skeletal hyperostosis. Multilevel ankylosis. Simple suboccipital lipoma. Upper chest: Negative Review of the MIP images confirms the above findings CTA HEAD FINDINGS Anterior circulation: Atherosclerosis of both carotid siphons with at least moderate right right para clinoid segment stenosis. Moderate narrowing of the left supraclinoid ICA. Hypoplastic right A1 segment with superimposed proximal narrowing. No major branch occlusion is noted. Notable moderate narrowings of the left A2 segment. There is diffuse atheromatous irregularity of bilateral MCA branches. Posterior circulation: Faint intermittently seen flow in the right vertebral artery to the level of the basilar. A right PICA is not seen but there is a sizable right AICA that is filling. The basilar is small in the setting of left fetal type PCA. There is superimposed moderate to advanced narrowing at the midportion. Extensive atheromatous irregularity of bilateral PCA branches. Venous sinuses: Patent Anatomic variants: None significant Delayed phase: No abnormal intracranial enhancement Review of the MIP images confirms the above findings IMPRESSION: 1. Severely depressed flow in the right V4 segment likely due to stenosis at the dura. Small basilar in the setting of fetal type left PCA, with superimposed moderate to advanced mid basilar stenosis. 2. Bilateral intracranial ICA stenosis that is moderate to advanced. 3. Advanced diffuse atherosclerotic irregularity of medium size intracranial vessels. 4. Severe right and moderate left vertebral origin stenosis. 5. No flow limiting stenosis in the atherosclerotic cervical carotid circulation. 6. 14 mm mass in the right tracheoesophageal groove with features of parathyroid adenoma. Correlate with this calcium labs. 7. Bilateral thyroid nodules measuring up to 25 mm on the right.  Electronically Signed   By: Monte Fantasia M.D.   On: 02/04/2017 10:26   Medications: . sodium chloride 999 mL/hr (02/02/17 1015)  . sodium chloride    . sodium chloride    . sodium chloride     . alfuzosin  10 mg Oral Q breakfast  . aspirin EC  81 mg Oral Daily  . calcium acetate  1,334 mg Oral TID WC  . carvedilol  3.125 mg Oral BID WC  . darbepoetin (ARANESP) injection - DIALYSIS  60 mcg Intravenous Q Tue-HD  . doxercalciferol  4 mcg Intravenous Q T,Th,Sa-HD  . ezetimibe  10 mg Oral Daily  . feeding supplement (PRO-STAT SUGAR FREE 64)  30 mL Oral BID  . fenofibrate  160 mg Oral Daily  . finasteride  5 mg Oral Daily  . insulin aspart  0-5 Units Subcutaneous QHS  . insulin aspart  0-9 Units Subcutaneous TID WC  . ipratropium  2 spray Each Nare Daily  . multivitamin  1 tablet Oral QHS  . warfarin   Does not apply Once  . Warfarin - Pharmacist Dosing Inpatient   Does not apply q1800   Dialysis Orders:  TTS [facility] HD time to 4 hours EDW 85.5kg will be lower at discharge  2/2.25 bath  Hep none  AVF RUA (successful declot/PTA Dr. Posey Pronto 01/28/17 as op (initally placed 01/2014 ) - Hectoral 69mcg IV q HD - Venofer 50mg  IV weekly  Assessment/Plan:  1. Possible small subcortical vs brainstem stroke. Initially L sided weakness/slurred speech - Head CT without acute abnormality. ECG A flutter.  Echo - EF 70. Gr 1DD. No SOE. Carotid dopplers neg. Significant flow limiting intracranial stenoses by CT angio.  Anticoagulation recommended b/o aflutter. Warfarin w/plavix bridge PLUS ASA d/t diffuse atherosclerosis  .  2. Gross Hematuria/Urinary retention - hematuria new this admission. Felt 2/2 enlarged prostate + foley trauma. Foley in to keep pressure on prostate fossa.(Dr. Tresa [facility] saw 5/19 - will need outpt f/u) Urine currently clear 3. ESRD - HD start 01/14/17/  (Only had HD 5/8, 5/10, 5/15 ((cannulation issues)) w/5/15 tmt of 2'53" longest PTA) Working up to 4 hours - 3.75 hr today  then 4 hours for subsequent tmts. Post weight today new EDW. 4. Anemia - Hgb 9.5  Aranesp 60 mcg IV dosed 5/22  5. 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  6. H/o A. Fib/ Pacemaker- Warfarin initiated - dosing per pharmacy 7. Orthostatic hypotension.syncope No recurrence. Back on carvedilol 3.125mg  per cardiology recommendations.Needing to remove some fluid today d/t SOB. Keep eye on BP. Need to avoid hypotension. Neuro is OK with midodrine if needed. BP goal 120-150 per Neuro 8. Nutrition - Renal diet/ prostat for low albumin  9. Disposition - anticipate transfer to rehab  [name], MD [place] Pager 02/06/2017, 8:53 AM

## 2017-02-06 NOTE — Care Management Note (Signed)
Case Management Note  Patient Details  Name: James David MRN: [id number] Date of Birth: 1936-02-17  Subjective/Objective:   For Discharge to CIR today.                 Action/Plan:   Expected Discharge Date:  02/06/17               Expected Discharge Plan:  [name]  In-House Referral:     Discharge planning Services  CM Consult  Post Acute Care Choice:    Choice offered to:     DME Arranged:    DME Agency:     HH Arranged:    HH Agency:     Status of Service:  Completed, signed off  If discussed at [employer], dates discussed:    Additional Comments:  Zenon Mayo, RN 02/06/2017, 3:42 PM

## 2017-02-06 NOTE — H&P (Signed)
Physical Medicine and Rehabilitation Admission H&P        Chief Complaint  Patient presents with  . Dizziness  : HPI: James David is a 81 y.o. right handed male with history of diabetes mellitus, atrial fibrillation/second-degree heart block with pacemaker maintained on aspirin, end-stage renal disease with dialysis recently initiated, hypertension. Per chart review patient lives with spouse independent prior to admission. One level townhome. Presented 01/29/2017 with dizziness, facial droop, slurred speech and left-sided weakness. Cranial CT scan showed evidence of chronic small vessel disease but no acute or evolving abnormalities. Patient did not receive TPA. Echocardiogram With ejection fraction of 80% grade 1 diastolic dysfunction. Carotid Doppler but no ICA stenosis. Neurology consulted suspect acute ischemic infarct and currently maintained on Plavix for CVA prophylaxisLater changed to Coumadin in light of history of atrial fibrillation with follow-up per cardiology services. . Patient continues on dialysis as directed per renal services. Physical and occupational therapy evaluations completed with recommendations of physical medicine rehabilitation consult.Patient was admitted for a comprehensive rehab program.   Patient states that despite having end-stage renal disease, he does produce urine. He had urinary retention and a Foley catheter was placed several days ago.  Review of Systems  Constitutional: Negative for chills and fever.  Eyes: Negative for blurred vision and double vision.  Respiratory: Negative for cough and shortness of breath.   Cardiovascular: Positive for palpitations and leg swelling. Negative for chest pain.  Gastrointestinal: Positive for constipation. Negative for nausea and vomiting.  Genitourinary: Positive for urgency.  Musculoskeletal: Positive for joint pain and myalgias.  Skin: Negative for rash.  Neurological: Positive for sensory change and speech  change. Negative for seizures and weakness.  All other systems reviewed and are negative.       Past Medical History:  Diagnosis Date  . Atrial fibrillation ([place])    . Chronic kidney disease, stage IV (severe) ([facility])      baseline creatinine 2-3  . DDD (degenerative disc disease)    . Diabetes mellitus without complication ([place])    . ESRD (end stage renal disease) on dialysis ([place])    . Hyperlipidemia    . Hypertension    . Melanoma ([place]) 2010    removed at [facility]  . Second degree heart block      a. s/p STJ dual chamber PPM - Dr [name]         Past Surgical History:  Procedure Laterality Date  . AV FISTULA PLACEMENT Right 05/31/2014    Procedure: Right Arm Brachiocephalic ARTERIOVENOUS FISTULA CREATION  ;  Surgeon: Conrad Northgate, MD;  Location: [place];  Service: Vascular;  Laterality: Right;  . CATARACT EXTRACTION W/ INTRAOCULAR LENS  IMPLANT, BILATERAL      . COLONOSCOPY      . EXCISION MELANOMA WITH SENTINEL LYMPH NODE BIOPSY Right 02/08/2016    Procedure: WIDE EXCISION RIGHT SHOULDER MELANOMA WITH RIGHT SENTINEL LYMPH NODE BIOPSY;  Surgeon: [name], MD;  Location: [place];  Service: General;  Laterality: Right;  . LAMINECTOMY      . PERMANENT PACEMAKER INSERTION N/A 01/20/2014    STJ Assurity dual chamber pacemaker implanted by Dr [name] for 2nd degree AV block         Family History  Problem Relation Age of Onset  . Pulmonary embolism Mother    . Diabetes Mother    . Heart attack Father    . Diabetes Brother      Social History:  reports that he has never  smoked. He has never used smokeless tobacco. He reports that he does not drink alcohol or use drugs. Allergies:       Allergies  Allergen Reactions  . Amlodipine Besylate Swelling      angioedema  . Lisinopril Other (See Comments)      Renal insufficiency  . Statins Other (See Comments)      Muscle aches          Medications Prior to Admission  Medication Sig Dispense Refill  . ACCU-CHEK AVIVA PLUS test strip         . acetaminophen (TYLENOL) 500 MG tablet Take 500 mg by mouth daily as needed for mild pain. (pain/headaches)      . aspirin EC 81 MG tablet Take 81 mg by mouth at bedtime.      . calcium acetate (PHOSLO) 667 MG capsule Take 2 capsules by mouth 3 (three) times daily.      . carvedilol (COREG) 25 MG tablet Take 1 tablet (25 mg total) by mouth 2 (two) times daily with a meal. 60 tablet 6  . ezetimibe (ZETIA) 10 MG tablet Take 10 mg by mouth daily.      . fenofibrate 160 MG tablet Take 160 mg by mouth daily.      . finasteride (PROSCAR) 5 MG tablet Take 5 mg by mouth daily.      . furosemide (LASIX) 80 MG tablet Take 40 mg by mouth daily.       Marland Kitchen glimepiride (AMARYL) 4 MG tablet Take 4 mg by mouth 2 (two) times daily.       . Hypromellose (ARTIFICIAL TEARS OP) Place 1 drop into both eyes daily as needed (supplement).      Marland Kitchen ipratropium (ATROVENT) 0.03 % nasal spray Place 2 sprays into both nostrils daily.       . tamsulosin (FLOMAX) 0.4 MG CAPS capsule Take 0.4 mg by mouth 3 (three) times a week. Monday, Wednesday and Friday (at bedtime)          Home: Home Living Family/patient expects to be discharged to:: Private residence Living Arrangements: Spouse/significant other Available Help at Discharge: Family, Available 24 hours/day Type of Home: Other(Comment) (townhome) Home Access: Level entry Home Layout: One level Bathroom Shower/Tub: Multimedia programmer: Handicapped height Bathroom Accessibility: Yes Home Equipment: Grab bars - tub/shower   Functional History: Prior Function Level of Independence: Independent Comments: Reports he was not using AD    Functional Status:  Mobility: Bed Mobility Overal bed mobility: Needs Assistance Bed Mobility: Supine to Sit, Sit to Supine Rolling: Max assist Sidelying to sit: Min assist Supine to sit: Min assist Sit to supine: Min assist General bed mobility comments: pt OOB in chair upon arrival Transfers Overall transfer  level: Needs assistance Equipment used: Rolling walker (2 wheeled) Transfers: Sit to/from Stand Sit to Stand: Min assist Stand pivot transfers: Max assist General transfer comment: vc for safety Ambulation/Gait Ambulation/Gait assistance: Min assist Ambulation Distance (Feet): 150 Feet Assistive device: Rolling walker (2 wheeled) Gait Pattern/deviations: Step-through pattern, Decreased stride length, Decreased dorsiflexion - right, Decreased dorsiflexion - left, Trunk flexed General Gait Details: cues for posture, proximity of RW, and stride length; pt able to improve bilat step length and DF with max cues; pt with difficulty negotiating objects on L side; mild gait deviations with horizontal head turns Gait velocity: Decreased Gait velocity interpretation: Below normal speed for age/gender   ADL: ADL Overall ADL's : Needs assistance/impaired Eating/Feeding: Sitting, Supervision/ safety Grooming: Min guard, Standing  Grooming Details (indicate cue type and reason): able to perform sitting EOB Upper Body Bathing: Supervision/ safety, Set up, Sitting Lower Body Bathing: Moderate assistance, Sit to/from stand Lower Body Bathing Details (indicate cue type and reason): is right hand dominant but states he always uses L and to wipe/wash buttocks.  able to perfrom in standing with mod. assistance for for balance and walker management but was able to bring LUE and reach desired area for washing Upper Body Dressing : Minimal assistance, Sitting Lower Body Dressing: Moderate assistance, Sit to/from stand Toilet Transfer: Minimal assistance, RW, Comfort height toilet, Ambulation, Grab bars Toilet Transfer Details (indicate cue type and reason): recliner placed outside of doorway and pt. able to amb. and pivot approx. 2 steps with mod a and cueing to complete turn and reach for grab bar Toileting- Clothing Manipulation and Hygiene: Minimal assistance, Sit to/from stand Toileting - Clothing  Manipulation Details (indicate cue type and reason): unable to attend to task and use either R/L hand to clean self Functional mobility during ADLs: Minimal assistance, Rolling walker, Cueing for safety General ADL Comments: Pt reports being more fatigued than what is his baseline   Cognition: Cognition Overall Cognitive Status: Impaired/Different from baseline Orientation Level: Oriented X4 Cognition Arousal/Alertness: Awake/alert Behavior During Therapy: WFL for tasks assessed/performed Overall Cognitive Status: Impaired/Different from baseline Area of Impairment: Attention, Safety/judgement, Awareness Current Attention Level: Selective Following Commands: Follows one step commands inconsistently Safety/Judgement: Decreased awareness of safety, Decreased awareness of deficits Awareness: Emergent Problem Solving: Slow processing, Decreased initiation, Difficulty sequencing, Requires verbal cues, Requires tactile cues General Comments: Repetition required for safety. Pt with unsaef use of RW. Note that pt continues to run RW into items on L side. Poor orientation of self in RW   Physical Exam: Blood pressure 125/90, pulse (!) 108, temperature 97.7 F (36.5 C), temperature source Oral, resp. rate (!) 21, height _0  (1.778 m), weight 81.4 kg (179 lb 7.3 oz), SpO2 99 %. Physical Exam  HENT:  Facial droop  Eyes: EOM are normal.  Neck: Normal range of motion. Neck supple. No thyromegaly present.  Cardiovascular:  Cardiac rate controlled  Respiratory: Effort normal and breath sounds normal. No respiratory distress.  GI: Soft. Bowel sounds are normal. He exhibits no distension.  Skin. Warm and dry Neurological: He is alert.  Dysarthric speech. Follows commands. He was able to state his name.cognitively appears appropriate. RUE and LUE grossly 5/5. Mild left pronator drift and decreased [place] on left with FTN. RLE and LLE 5/5 with mild impairment in HTS on left. Intact light touch and  pain     Lab Results Last 48 Hours        Results for orders placed or performed during the hospital encounter of 01/29/17 (from the past 48 hour(s))  Glucose, capillary     Status: Abnormal    Collection Time: 02/04/17 12:07 PM  Result Value Ref Range    Glucose-Capillary 240 (H) 65 - 99 mg/dL    Comment 1 Notify RN      Comment 2 Document in Chart    Glucose, capillary     Status: Abnormal    Collection Time: 02/04/17  9:32 PM  Result Value Ref Range    Glucose-Capillary 123 (H) 65 - 99 mg/dL  Protime-INR     Status: Abnormal    Collection Time: 02/05/17  2:30 AM  Result Value Ref Range    Prothrombin Time 23.7 (H) 11.4 - 15.2 seconds    INR 2.08  CBC     Status: Abnormal    Collection Time: 02/05/17  2:30 AM  Result Value Ref Range    WBC 10.8 (H) 4.0 - 10.5 K/uL    RBC 3.22 (L) 4.22 - 5.81 MIL/uL    Hemoglobin 9.5 (L) 13.0 - 17.0 g/dL    HCT 29.7 (L) 39.0 - 52.0 %    MCV 92.2 78.0 - 100.0 fL    MCH 29.5 26.0 - 34.0 pg    MCHC 32.0 30.0 - 36.0 g/dL    RDW 14.0 11.5 - 15.5 %    Platelets 184 150 - 400 K/uL  TSH     Status: None    Collection Time: 02/05/17  2:30 AM  Result Value Ref Range    TSH 0.699 0.350 - 4.500 uIU/mL      Comment: Performed by a 3rd Generation assay with a functional sensitivity of <=0.01 uIU/mL.  Glucose, capillary     Status: Abnormal    Collection Time: 02/05/17  7:45 AM  Result Value Ref Range    Glucose-Capillary 175 (H) 65 - 99 mg/dL  Glucose, capillary     Status: Abnormal    Collection Time: 02/05/17  3:15 PM  Result Value Ref Range    Glucose-Capillary 315 (H) 65 - 99 mg/dL  Glucose, capillary     Status: Abnormal    Collection Time: 02/05/17  4:35 PM  Result Value Ref Range    Glucose-Capillary 247 (H) 65 - 99 mg/dL  Glucose, capillary     Status: Abnormal    Collection Time: 02/05/17  9:12 PM  Result Value Ref Range    Glucose-Capillary 203 (H) 65 - 99 mg/dL  Protime-INR     Status: Abnormal    Collection Time: 02/06/17  3:01  AM  Result Value Ref Range    Prothrombin Time 22.8 (H) 11.4 - 15.2 seconds    INR 1.98    CBC     Status: Abnormal    Collection Time: 02/06/17  8:00 AM  Result Value Ref Range    WBC 9.9 4.0 - 10.5 K/uL    RBC 3.14 (L) 4.22 - 5.81 MIL/uL    Hemoglobin 9.5 (L) 13.0 - 17.0 g/dL    HCT 29.0 (L) 39.0 - 52.0 %    MCV 92.4 78.0 - 100.0 fL    MCH 30.3 26.0 - 34.0 pg    MCHC 32.8 30.0 - 36.0 g/dL    RDW 14.1 11.5 - 15.5 %    Platelets 185 150 - 400 K/uL  Renal function panel     Status: Abnormal    Collection Time: 02/06/17  8:00 AM  Result Value Ref Range    Sodium 134 (L) 135 - 145 mmol/L    Potassium 4.2 3.5 - 5.1 mmol/L    Chloride 100 (L) 101 - 111 mmol/L    CO2 23 22 - 32 mmol/L    Glucose, Bld 192 (H) 65 - 99 mg/dL    BUN 68 (H) 6 - 20 mg/dL    Creatinine, Ser 6.47 (H) 0.61 - 1.24 mg/dL    Calcium 8.5 (L) 8.9 - 10.3 mg/dL    Phosphorus 4.9 (H) 2.5 - 4.6 mg/dL    Albumin 2.6 (L) 3.5 - 5.0 g/dL    GFR calc non Af Amer 7 (L) >60 mL/min    GFR calc Af Amer 8 (L) >60 mL/min      Comment: (NOTE) The eGFR has been calculated using the CKD EPI equation. This  calculation has not been validated in all clinical situations. eGFR's persistently <60 mL/min signify possible Chronic Kidney Disease.      Anion gap 11 5 - 15      Imaging Results (Last 48 hours)  No results found.           Medical Problem List and Plan: 1.  Gait disorder  secondary to right subcortical infarct 2.  DVT Prophylaxis/Anticoagulation: Coumadin. Monitor for any bleeding episodes 3. Pain Management: Tylenol as needed 4. Mood: Provide emotional support 5. Neuropsych: This patient is capable of making decisions on his own behalf. 6. Skin/Wound Care: Routine skin checks 7. Fluids/Electrolytes/Nutrition: Routine I&O's with follow-up chemistry 8. Atrial fibrillation. Continue Coumadin therapy. Cardiac rate controlled. Follow-up cardiology services 9. ESRD. Continue hemodialysis as per renal services 10.  Diabetes mellitus of peripheral neuropathy. Hemoglobin A1c 7.0.SSI. Check blood sugars before meals and at bedtime. Patient on Amaryl 4 mg twice a day prior to admission. Resume as needed 11. Acute on chronic anemia. Continue Aranesp 12. Hypertension. Coreg 3.125 mg twice a day 13. BPH. Uroxatral 10 mg daily, Proscar 5 mg daily, has urinary retention. Will DC Uroxatral and have another voiding trial 14. Hyperlipidemia. Zetia           Post Admission Physician Evaluation: 1. Functional deficits secondary  to Right subcortical infarct. 2. Patient is admitted to receive collaborative, interdisciplinary care between the physiatrist, rehab nursing staff, and therapy team. 3. Patient's level of medical complexity and substantial therapy needs in context of that medical necessity cannot be provided at a lesser intensity of care such as a SNF. 4. Patient has experienced substantial functional loss from his/her baseline which was documented above under the "Functional History" and "Functional Status" headings.  Judging by the patient's diagnosis, physical exam, and functional history, the patient has potential for functional progress which will result in measurable gains while on inpatient rehab.  These gains will be of substantial and practical use upon discharge  in facilitating mobility and self-care at the household level. 5. Physiatrist will provide 24 hour management of medical needs as well as oversight of the therapy plan/treatment and provide guidance as appropriate regarding the interaction of the two. 6. The Preadmission Screening has been reviewed and patient status is unchanged unless otherwise stated above. 7. 24 hour rehab nursing will assist with bladder management, bowel management, safety, skin/wound care, disease management, medication administration, pain management and patient education  and help integrate therapy concepts, techniques,education, etc. 8. PT will assess and treat  for/with: pre gait, gait training, endurance , safety, equipment, neuromuscular re education.   Goals are: Mod I. 9. OT will assess and treat for/with: ADLs, Cognitive perceptual skills, Neuromuscular re education, safety, endurance, equipment.   Goals are: Mod I. Therapy may proceed with showering this patient. 10. SLP will assess and treat for/with: NA.  Goals are: NA. 11. Case Management and Social Worker will assess and treat for psychological issues and discharge planning. 12. Team conference will be held weekly to assess progress toward goals and to determine barriers to discharge. 13. Patient will receive at least 3 hours of therapy per day at least 5 days per week. 14. ELOS: 5-7d       15. Prognosis:  excellent         Charlett Blake M.D. [facility] FAAPM&R (Sports Med, Neuromuscular Med) Diplomate Am Board of Electrodiagnostic Med  Cathlyn Parsons., PA-C 02/06/2017

## 2017-02-06 NOTE — Progress Notes (Signed)
James Diones, RN Rehab Admission Coordinator Signed Physical Medicine and Rehabilitation  PMR Pre-admission Date of Service: 02/06/2017 11:03 AM  Related encounter: ED to Hosp-Admission (Current) from 01/29/2017 in [facility] 4E CV [place]       [] Hide copied text PMR Admission Coordinator Pre-Admission Assessment  Patient: James David is an 81 y.o., male MRN: [id number] DOB: 1936/04/14 Height: 5' 10"  (177.8 cm) Weight: 81.4 kg (179 lb 7.3 oz)                                                                                                                                                  Insurance Information HMO: Yes   PPO:       PCP:       IPA:       80/20:       OTHER: # [id number] PRIMARY: [place]      Policy#: [id number]      Subscriber:  James David Name: James David      Phone#:  [phone]     Fax#: [phone] Pre-Cert#: [id number]      Employer: Retired Benefits:  Phone #: [phone]     Name:  James David. Date: 09/16/16     Deduct:  $0      Out of Pocket Max: $4400 (met $289.91)      Life Max: unlimited CIR: $345 days 1-5      SNF: $0 days 1-20; $160 days 21-48: $0 days 49-100 Outpatient: medical necessity     Co-Pay: $40/visit Home Health: 100%      Co-Pay: none DME: 80%     Co-Pay: 20% Providers: in Artist Information    Name James David Spouse [phone]  [phone]     Current Medical History  Patient Admitting Diagnosis:  ? R subcortical infarct  History of Present Illness: A 81 y.o.right handed malewith history of diabetes mellitus, atrial fibrillation/second-degree heart block with pacemaker maintained on aspirin, end-stage renal disease with dialysis recently initiated 01/14/17,  hypertension. Per chart review patient lives with spouse independent prior to admission. One level townhome. Presented 01/29/2017 with dizziness, facial droop, slurred speech  and left-sided weakness. Cranial CT scan showed evidence of chronic small vessel disease but no acute or evolving abnormalities. Patient did not receive TPA. Echocardiogram With ejection fraction of 55% grade 1 diastolic dysfunction. Carotid Doppler but no ICA stenosis. Neurology consulted suspect acute ischemic infarct and currently maintained on Plavix for CVA prophylaxisLater changed to Coumadin in light of history of atrial fibrillation with follow-up per cardiology services. . Patient continues on dialysis as directed per renal services. Physical and occupational therapy evaluations completed with recommendations of physical medicine rehabilitation consult.Patient to be admitted for a comprehensive inpatient rehab program.  Total: 0=NIH  Past Medical History      Past Medical History:  Diagnosis Date  . Atrial fibrillation ([place])   . Chronic kidney disease, stage IV (severe) (HCC)    baseline creatinine 2-3  . DDD (degenerative disc disease)   . Diabetes mellitus without complication ([place])   . ESRD (end stage renal disease) on dialysis ([place])   . Hyperlipidemia   . Hypertension   . Melanoma ([place]) 2010   removed at [facility]  . Second degree heart block    a. s/p STJ dual chamber PPM - Dr [name]    Family History  family history includes Diabetes in his brother and mother; Heart attack in his father; Pulmonary embolism in his mother.  Prior Rehab/Hospitalizations: No previous rehab.  Has the patient had major surgery during 100 days prior to admission? No  Current Medications   Current Facility-Administered Medications:  .  doxercalciferol (HECTOROL) 4 MCG/2ML injection, , , ,  .  0.9 %  sodium chloride infusion, 999 mL/hr, Intravenous, Continuous, Bonnielee Haff, MD, Last Rate: 999 mL/hr at 02/02/17 1015, 999 mL/hr at 02/02/17 1015 .  0.9 %  sodium chloride infusion, 100 mL, Intravenous, PRN, Ejigiri, Ogechi Grace, PA-C .  0.9 %  sodium chloride infusion, 100 mL,  Intravenous, PRN, Ejigiri, Thomos Lemons, PA-C .  acetaminophen (TYLENOL) tablet 650 mg, 650 mg, Oral, Q4H PRN, 650 mg at 02/06/17 0727 **OR** acetaminophen (TYLENOL) solution 650 mg, 650 mg, Per Tube, Q4H PRN **OR** acetaminophen (TYLENOL) suppository 650 mg, 650 mg, Rectal, Q4H PRN, Samella Parr, NP .  alfuzosin (UROXATRAL) 24 hr tablet 10 mg, 10 mg, Oral, Q breakfast, Bonnielee Haff, MD, 10 mg at 02/05/17 0907 .  aspirin EC tablet 81 mg, 81 mg, Oral, Daily, Bonnielee Haff, MD, 81 mg at 02/05/17 0907 .  calcium acetate (PHOSLO) capsule 1,334 mg, 1,334 mg, Oral, TID WC, Samella Parr, NP, 1,334 mg at 02/05/17 1813 .  camphor-menthol (SARNA) lotion, , Topical, PRN, Bonnielee Haff, MD, 1 application at 41/74/08 0249 .  carvedilol (COREG) tablet 3.125 mg, 3.125 mg, Oral, BID WC, [name] Fire, MD, 3.125 mg at 02/05/17 1813 .  Darbepoetin Alfa (ARANESP) injection 60 mcg, 60 mcg, Intravenous, Q Tue-HD, Lynnda Child, PA-C, 60 mcg at 02/04/17 1821 .  doxercalciferol (HECTOROL) injection 4 mcg, 4 mcg, Intravenous, Q T,Th,Sa-HD, Loren Racer, PA-C, 4 mcg at 02/06/17 1045 .  ezetimibe (ZETIA) tablet 10 mg, 10 mg, Oral, Daily, Erin Hearing L, NP, 10 mg at 02/05/17 0909 .  feeding supplement (PRO-STAT SUGAR FREE 64) liquid 30 mL, 30 mL, Oral, BID, Loren Racer, PA-C, 30 mL at 02/05/17 2117 .  fenofibrate tablet 160 mg, 160 mg, Oral, Daily, Samella Parr, NP, 160 mg at 02/05/17 0907 .  finasteride (PROSCAR) tablet 5 mg, 5 mg, Oral, Daily, Erin Hearing L, NP, 5 mg at 02/05/17 0907 .  heparin injection 1,000 Units, 1,000 Units, Dialysis, PRN, Ejigiri, Thomos Lemons, PA-C .  insulin aspart (novoLOG) injection 0-5 Units, 0-5 Units, Subcutaneous, QHS, Samella Parr, NP, 2 Units at 02/05/17 2117 .  insulin aspart (novoLOG) injection 0-9 Units, 0-9 Units, Subcutaneous, TID WC, Samella Parr, NP, 3 Units at 02/05/17 1813 .  ipratropium (ATROVENT) 0.06 % nasal spray 2  spray, 2 spray, Each Nare, Daily, Samella Parr, NP, 2 spray at 02/05/17 1814 .  lidocaine (PF) (XYLOCAINE) 1 % injection 5 mL, 5 mL, Intradermal, PRN, Ejigiri, Thomos Lemons, PA-C .  lidocaine-prilocaine (EMLA) cream  1 application, 1 application, Topical, PRN, Ejigiri, Thomos Lemons, PA-C .  multivitamin (RENA-VIT) tablet 1 tablet, 1 tablet, Oral, QHS, Ernest Haber, PA-C, 1 tablet at 02/05/17 2117 .  ondansetron (ZOFRAN) injection 4 mg, 4 mg, Intravenous, Q6H PRN, Bonnielee Haff, MD .  pentafluoroprop-tetrafluoroeth (GEBAUERS) aerosol 1 application, 1 application, Topical, PRN, Ejigiri, Thomos Lemons, PA-C .  polyvinyl alcohol (LIQUIFILM TEARS) 1.4 % ophthalmic solution 2 drop, 2 drop, Both Eyes, Daily PRN, Samella Parr, NP, 2 drop at 02/05/17 1222 .  sodium chloride 0.9 % bolus 1,000 mL, 1,000 mL, Intravenous, Once, Roney Jaffe, MD .  warfarin (COUMADIN) video, , Does not apply, Once, Bonnielee Haff, MD .  Warfarin - Pharmacist Dosing Inpatient, , Does not apply, q1800, Masters, Jake Church, [facility], Stopped at 02/03/17 1800  Patients Current Diet: Diet renal/carb modified with fluid restriction Diet-HS Snack? Nothing; Room service appropriate? Yes; Fluid consistency: Thin  Precautions / Restrictions Precautions Precautions: Fall Precaution Comments: watch BP and RR Restrictions Weight Bearing Restrictions: No   Has the patient had 2 or more falls or a fall with injury in the past year?No  Prior Activity Level Community (5-7x/wk): Went out daily, was driving, is retired.  Home Assistive Devices / Equipment Home Assistive Devices/Equipment: None Home Equipment: Grab bars - tub/shower  Prior Device Use: Indicate devices/aids used by the patient prior to current illness, exacerbation or injury? None  Prior Functional Level Prior Function Level of Independence: Independent Comments: Reports he was not using AD   Self Care: Did the patient need help bathing, dressing,  using the toilet or eating?  Independent  Indoor Mobility: Did the patient need assistance with walking from room to room (with or without device)? Independent  Stairs: Did the patient need assistance with internal or external stairs (with or without device)? Independent  Functional Cognition: Did the patient need help planning regular tasks such as shopping or remembering to take medications? Independent  Current Functional Level Cognition  Overall Cognitive Status: Impaired/Different from baseline Current Attention Level: Selective Orientation Level: Oriented X4 Following Commands: Follows one step commands inconsistently Safety/Judgement: Decreased awareness of safety, Decreased awareness of deficits General Comments: Repetition required for safety. Pt with unsaef use of RW. Note that pt continues to run RW into items on David side. Poor orientation of self in RW    Extremity Assessment (includes Sensation/Coordination)  Upper Extremity Assessment: LUE deficits/detail, Generalized weakness LUE Deficits / Details: Signs of ataxia and dysmetria during movement.   Lower Extremity Assessment: Defer to PT evaluation RLE Deficits / Details: Ataxia noted with alternating toe tapping, and slight ataxia noted with heel to shin test. Grossly 3/5 throughout.  LLE Deficits / Details: Ataxia noted with alternating toe tapping, and slight ataxia noted with heel to shin test. Grossly 3/5 throughout.     ADLs  Overall ADL's : Needs assistance/impaired Eating/Feeding: Sitting, Supervision/ safety Grooming: Min guard, Standing Grooming Details (indicate cue type and reason): able to perform sitting EOB Upper Body Bathing: Supervision/ safety, Set up, Sitting Lower Body Bathing: Moderate assistance, Sit to/from stand Lower Body Bathing Details (indicate cue type and reason): is right hand dominant but states he always uses David and to wipe/wash buttocks.  able to perfrom in standing with mod.  assistance for for balance and walker management but was able to bring LUE and reach desired area for washing Upper Body Dressing : Minimal assistance, Sitting Lower Body Dressing: Moderate assistance, Sit to/from stand Toilet Transfer: Minimal assistance, RW, Comfort height toilet, Ambulation, Grab  bars Toilet Transfer Details (indicate cue type and reason): recliner placed outside of doorway and pt. able to amb. and pivot approx. 2 steps with mod a and cueing to complete turn and reach for grab bar Toileting- Clothing Manipulation and Hygiene: Minimal assistance, Sit to/from stand Toileting - Clothing Manipulation Details (indicate cue type and reason): unable to attend to task and use either R/David hand to clean self Functional mobility during ADLs: Minimal assistance, Rolling walker, Cueing for safety General ADL Comments: Pt reports being more fatigued than what is his baseline    Mobility  Overal bed mobility: Needs Assistance Bed Mobility: Supine to Sit, Sit to Supine Rolling: Max assist Sidelying to sit: Min assist Supine to sit: Min assist Sit to supine: Min assist General bed mobility comments: pt OOB in chair upon arrival    Transfers  Overall transfer level: Needs assistance Equipment used: Rolling walker (2 wheeled) Transfers: Sit to/from Stand Sit to Stand: Min assist Stand pivot transfers: Max assist General transfer comment: vc for safety    Ambulation / Gait / Stairs / Wheelchair Mobility  Ambulation/Gait Ambulation/Gait assistance: Museum/gallery curator (Feet): 150 Feet Assistive device: Rolling walker (2 wheeled) Gait Pattern/deviations: Step-through pattern, Decreased stride length, Decreased dorsiflexion - right, Decreased dorsiflexion - left, Trunk flexed General Gait Details: cues for posture, proximity of RW, and stride length; pt able to improve bilat step length and DF with max cues; pt with difficulty negotiating objects on David side; mild gait  deviations with horizontal head turns Gait velocity: Decreased Gait velocity interpretation: Below normal speed for age/gender    Posture / Balance Dynamic Sitting Balance Sitting balance - Comments: leaning posteriorly sitting on EOB Balance Overall balance assessment: Needs assistance Sitting-balance support: Bilateral upper extremity supported Sitting balance-Leahy Scale: Good Sitting balance - Comments: leaning posteriorly sitting on EOB Postural control: Posterior lean Standing balance support: Bilateral upper extremity supported, During functional activity Standing balance-Leahy Scale: Poor Standing balance comment: UE support needed for balance    Special needs/care consideration BiPAP/CPAP No CPM No Continuous Drip IV No  Dialysis Yes        DaysT-TH-SAT Life Vest No Oxygen No Special Bed No Trach Size No Wound Vac (area) No      Skin Has itchy skin on his back, has a skin tear on right forearm with dressing                             Bowel mgmt: Last documented BM 02/04/17 Bladder mgmt: Urinary catheter in place Diabetic mgmt Yes, on oral medications at home.  On insulin in acute hospital.    Previous Home Environment Living Arrangements: Spouse/significant other Available Help at Discharge: Family, Available 24 hours/day Type of Home: Other(Comment) (townhome) Home Layout: One level Home Access: Level entry Bathroom Shower/Tub: Multimedia programmer: Handicapped height Bathroom Accessibility: Yes Home Care Services: No  Discharge Living Setting Plans for Discharge Living Setting: Patient's home, Other (Comment) (Lives with wife in a townhouse.) Type of Home at Discharge: Other (Comment) (Townhouse) Discharge Home Layout: One level Discharge Home Access: Stairs to enter Entrance Stairs-Number of Steps: 1 step Does the patient have any problems obtaining your medications?: No  Social/Family/Support Systems Patient Roles: Spouse (Has a  wife.) Contact Information: Neilan Rizzo - wife  Anticipated Caregiver: wife Anticipated Caregiver's Contact Information: [name] - wife (h) [phone] (c) [phone] Ability/Limitations of Caregiver: Wife is retired and can provide supervision but no lifting  due to her heart problems Caregiver Availability: 24/7 Discharge Plan Discussed with Primary Caregiver: Yes Is Caregiver In Agreement with Plan?: Yes Does Caregiver/Family have Issues with Lodging/Transportation while Pt is in Rehab?: No  Goals/Additional Needs Patient/Family Goal for Rehab: PT/OT mod I goals Expected length of stay: 5-7 days (but suspect he may need 7-10 days) Cultural Considerations: None Dietary Needs: Renal, carb mod, fluid restricted, thin liquids Equipment Needs: TBD Special Service Needs: HD T-TH-Sat Pt/Family Agrees to Admission and willing to participate: Yes Program Orientation Provided & Reviewed with Pt/Caregiver Including Roles  & Responsibilities: Yes  Decrease burden of Care through IP rehab admission: N/A  Possible need for SNF placement upon discharge: Not anticipated  Patient Condition: This patient's medical and functional status has changed since the consult dated: 01/31/17 in which the Rehabilitation Physician determined and documented that the patient's condition is appropriate for intensive rehabilitative care in an inpatient rehabilitation facility. See "History of Present Illness" (above) for medical update. Functional changes are: Currently requiring mod assist for transfers and min assist to ambulate 150 feet RW. Patient's medical and functional status update has been discussed with the Rehabilitation physician and patient remains appropriate for inpatient rehabilitation. Will admit to inpatient rehab today.  Preadmission Screen Completed By:  James David, 02/06/2017 11:17 AM ______________________________________________________________________   Discussed status with Dr.  Letta Pate on 02/06/17 at  1117 and received telephone approval for admission today.  Admission Coordinator:  James David, time 1117/Date 02/06/17       Cosigned by: Jamse Arn, MD at 02/06/2017 1:33 PM  Revision History

## 2017-02-06 NOTE — Progress Notes (Signed)
Meredith Staggers, MD Physician Signed Physical Medicine and Rehabilitation  Consult Note Date of Service: 01/31/2017 9:46 AM  Related encounter: ED to Hosp-Admission (Current) from 01/29/2017 in [facility] 4E CV SURGICAL PROGRESSIVE CARE     Expand All Collapse All   [] Hide copied text [] Hover for attribution information      Physical Medicine and Rehabilitation Consult Reason for Consult: Dysarthria/ facial droop with gait disorder Referring Physician: [facility]   HPI: James David is a 81 y.o. right handed male with history of diabetes mellitus, atrial fibrillation/second-degree heart block with pacemaker maintained on aspirin, end-stage renal disease with dialysis recently initiated, hypertension. Per chart review patient lives with spouse independent prior to admission. One level townhome. Presented 01/29/2017 with dizziness, facial droop, slurred speech and left-sided weakness. Cranial CT scan showed evidence of chronic small vessel disease but no acute or evolving abnormalities. Patient did not receive TPA. Echocardiogram pending. Carotid Doppler but no ICA stenosis. Neurology consulted suspect acute ischemic infarct and currently maintained on Plavix for CVA prophylaxis. Subcutaneous heparin for DVT prophylaxis. Patient continues on dialysis as directed per renal services. Physical and occupational therapy evaluations completed with recommendations of physical medicine rehabilitation consult.   Review of Systems  Constitutional: Negative for chills and fever.  HENT: Negative for hearing loss.   Eyes: Negative for blurred vision and double vision.  Respiratory: Negative for cough and shortness of breath.   Cardiovascular: Positive for palpitations and leg swelling. Negative for chest pain.  Gastrointestinal: Positive for constipation. Negative for nausea and vomiting.  Genitourinary: Positive for urgency. Negative for dysuria and hematuria.  Musculoskeletal: Positive for myalgias.    Skin: Negative for rash.  Neurological: Positive for sensory change, speech change and weakness. Negative for seizures.  All other systems reviewed and are negative.      Past Medical History:  Diagnosis Date  . Atrial fibrillation ([place])   . Chronic kidney disease, stage IV (severe) (HCC)    baseline creatinine 2-3  . DDD (degenerative disc disease)   . Diabetes mellitus without complication ([place])   . Hyperlipidemia   . Hypertension   . Melanoma ([place]) 2010   removed at [facility]  . Second degree heart block    a. s/p STJ dual chamber PPM - Dr [name]   Past Surgical History:  Procedure Laterality Date  . AV FISTULA PLACEMENT Right 05/31/2014   Procedure: Right Arm Brachiocephalic ARTERIOVENOUS FISTULA CREATION  ;  Surgeon: Conrad Mango, MD;  Location: [place];  Service: Vascular;  Laterality: Right;  . CATARACT EXTRACTION W/ INTRAOCULAR LENS  IMPLANT, BILATERAL    . COLONOSCOPY    . EXCISION MELANOMA WITH SENTINEL LYMPH NODE BIOPSY Right 02/08/2016   Procedure: WIDE EXCISION RIGHT SHOULDER MELANOMA WITH RIGHT SENTINEL LYMPH NODE BIOPSY;  Surgeon: [name], MD;  Location: [place];  Service: General;  Laterality: Right;  . LAMINECTOMY    . PERMANENT PACEMAKER INSERTION N/A 01/20/2014   STJ Assurity dual chamber pacemaker implanted by Dr [name] for 2nd degree AV block        Family History  Problem Relation Age of Onset  . Pulmonary embolism Mother   . Diabetes Mother   . Heart attack Father   . Diabetes Brother    Social History:  reports that he has never smoked. He has never used smokeless tobacco. He reports that he does not drink alcohol or use drugs. Allergies:       Allergies  Allergen Reactions  . Amlodipine Besylate Swelling  angioedema  . Lisinopril Other (See Comments)    Renal insufficiency  . Statins Other (See Comments)    Muscle aches         Medications Prior to Admission  Medication Sig Dispense Refill  . ACCU-CHEK AVIVA  PLUS test strip     . acetaminophen (TYLENOL) 500 MG tablet Take 500 mg by mouth daily as needed for mild pain. (pain/headaches)    . aspirin EC 81 MG tablet Take 81 mg by mouth at bedtime.    . calcium acetate (PHOSLO) 667 MG capsule Take 2 capsules by mouth 3 (three) times daily.    . carvedilol (COREG) 25 MG tablet Take 1 tablet (25 mg total) by mouth 2 (two) times daily with a meal. 60 tablet 6  . ezetimibe (ZETIA) 10 MG tablet Take 10 mg by mouth daily.    . fenofibrate 160 MG tablet Take 160 mg by mouth daily.    . finasteride (PROSCAR) 5 MG tablet Take 5 mg by mouth daily.    . furosemide (LASIX) 80 MG tablet Take 40 mg by mouth daily.     Marland Kitchen glimepiride (AMARYL) 4 MG tablet Take 4 mg by mouth 2 (two) times daily.     . Hypromellose (ARTIFICIAL TEARS OP) Place 1 drop into both eyes daily as needed (supplement).    Marland Kitchen ipratropium (ATROVENT) 0.03 % nasal spray Place 2 sprays into both nostrils daily.     . tamsulosin (FLOMAX) 0.4 MG CAPS capsule Take 0.4 mg by mouth 3 (three) times a week. Monday, Wednesday and Friday (at bedtime)      Home: Home Living Family/patient expects to be discharged to:: Private residence Living Arrangements: Spouse/significant other Available Help at Discharge: Family, Available 24 hours/day Type of Home: Other(Comment) (townhome) Home Access: Level entry Home Layout: One level Bathroom Shower/Tub: Multimedia programmer: Handicapped height Bathroom Accessibility: Yes Home Equipment: Grab bars - tub/shower  Functional History: Prior Function Level of Independence: Independent Comments: Reports he was not using AD  Functional Status:  Mobility: Bed Mobility Overal bed mobility: Needs Assistance Bed Mobility: Supine to Sit, Sit to Supine Supine to sit: Mod assist Sit to supine: Mod assist General bed mobility comments: Mod assist for elevating trunk during supine<>sti and managing B LEs during  sit<>supine. Transfers Overall transfer level: Needs assistance Equipment used: Rolling walker (2 wheeled) Transfers: Sit to/from Stand Sit to Stand: Mod assist General transfer comment: Able to complete sit<>stand during ADL with heavy mod lifting assist. Significant posterior and L lateral lean with pt demonstrating difficulty moving L UE from bed to RW. Feel this may be a combination of coordination deficits and fear of falling. Ambulation/Gait Ambulation/Gait assistance: Mod assist Ambulation Distance (Feet): 1 Feet Assistive device: Rolling walker (2 wheeled) Gait Pattern/deviations: Step-to pattern, Decreased stride length, Ataxic, Trunk flexed, Wide base of support General Gait Details: Attempted side stepping along bed with RW. Required mod A secondary to decreased balance and posterior/L lateral lean. Verbal cues for midline orientation and sequencing using RW.  Gait velocity: Decreased Gait velocity interpretation: Below normal speed for age/gender  ADL: ADL Overall ADL's : Needs assistance/impaired Eating/Feeding: Sitting, Supervision/ safety Grooming: Minimal assistance, Sitting Upper Body Bathing: Min guard, Sitting Lower Body Bathing: Moderate assistance, Sit to/from stand Upper Body Dressing : Min guard, Sitting Lower Body Dressing: Moderate assistance, Sit to/from stand Toilet Transfer: Moderate assistance, RW Toilet Transfer Details (indicate cue type and reason): Simualted with sit<>stand followed by 2 steps ambulation. Additionally able to complete  side steps toward head of bed. Toileting- Clothing Manipulation and Hygiene: Moderate assistance, Sit to/from stand Functional mobility during ADLs: Moderate assistance General ADL Comments: Limited functional mobility due to instability. Pt falling to the L in standing and with poor dynamic sitting balance during ADL.  Cognition: Cognition Overall Cognitive Status: Within Functional Limits for tasks  assessed Orientation Level: Oriented X4 Cognition Arousal/Alertness: Awake/alert Behavior During Therapy: WFL for tasks assessed/performed Overall Cognitive Status: Within Functional Limits for tasks assessed  Blood pressure (!) 146/67, pulse 89, temperature 98.1 F (36.7 C), temperature source Oral, resp. rate 20, height 5\' 10"  (1.778 m), weight 84.8 kg (186 lb 15.2 oz), SpO2 91 %. Physical Exam  Vitals reviewed. Constitutional: He appears well-developed.  HENT:  Facial droop  Eyes:  Pupils reactive to light  Neck: Normal range of motion. Neck supple. No thyromegaly present.  Cardiovascular:  Cardiac rate control,  Respiratory: Effort normal and breath sounds normal. No respiratory distress.  GI: Soft. Bowel sounds are normal. He exhibits no distension.  Neurological: He is alert.  Dysarthric speech. Follows commands. He was able to state his name.cognitively appears appropriate. RUE and LUE grossly 5/5. Mild left pronator drift and decreased [place] on left with FTN. RLE and LLE 5/5 with mild impairment in HTS on left. Intact light touch and pain.   Skin: Skin is warm and dry.  Psychiatric: He has a normal mood and affect. His behavior is normal.    Lab Results Last 24 Hours       Results for orders placed or performed during the hospital encounter of 01/29/17 (from the past 24 hour(s))  Glucose, capillary     Status: Abnormal   Collection Time: 01/30/17 11:42 AM  Result Value Ref Range   Glucose-Capillary 154 (H) 65 - 99 mg/dL  Glucose, capillary     Status: Abnormal   Collection Time: 01/30/17  4:14 PM  Result Value Ref Range   Glucose-Capillary 115 (H) 65 - 99 mg/dL  Hepatitis B surface antigen     Status: None   Collection Time: 01/30/17  7:17 PM  Result Value Ref Range   Hepatitis B Surface Ag Negative Negative  Glucose, capillary     Status: Abnormal   Collection Time: 01/31/17  1:24 AM  Result Value Ref Range   Glucose-Capillary 142 (H) 65 - 99 mg/dL  CBC      Status: Abnormal   Collection Time: 01/31/17  3:52 AM  Result Value Ref Range   WBC 11.5 (H) 4.0 - 10.5 K/uL   RBC 3.32 (L) 4.22 - 5.81 MIL/uL   Hemoglobin 10.1 (L) 13.0 - 17.0 g/dL   HCT 30.3 (L) 39.0 - 52.0 %   MCV 91.3 78.0 - 100.0 fL   MCH 30.4 26.0 - 34.0 pg   MCHC 33.3 30.0 - 36.0 g/dL   RDW 14.1 11.5 - 15.5 %   Platelets 167 150 - 400 K/uL  Basic metabolic panel     Status: Abnormal   Collection Time: 01/31/17  3:52 AM  Result Value Ref Range   Sodium 134 (L) 135 - 145 mmol/L   Potassium 3.6 3.5 - 5.1 mmol/L   Chloride 96 (L) 101 - 111 mmol/L   CO2 26 22 - 32 mmol/L   Glucose, Bld 167 (H) 65 - 99 mg/dL   BUN 37 (H) 6 - 20 mg/dL   Creatinine, Ser 4.82 (H) 0.61 - 1.24 mg/dL   Calcium 8.2 (L) 8.9 - 10.3 mg/dL   GFR calc non  Af Amer 10 (L) >60 mL/min   GFR calc Af Amer 12 (L) >60 mL/min   Anion gap 12 5 - 15  Glucose, capillary     Status: Abnormal   Collection Time: 01/31/17  6:43 AM  Result Value Ref Range   Glucose-Capillary 158 (H) 65 - 99 mg/dL   Comment 1 Notify RN    Comment 2 Document in Chart       Imaging Results (Last 48 hours)  Dg Chest 2 View  Result Date: 01/29/2017 CLINICAL DATA:  Dizziness EXAM: CHEST  2 VIEW COMPARISON:  01/21/2014 FINDINGS: Pacing device is again seen and stable. Cardiac shadow is stable. Vascular stent is noted in the subclavian region on the right. The lungs are clear bilaterally. Postsurgical changes in the left axilla are noted. IMPRESSION: No acute abnormality noted. Electronically Signed   By: Inez Catalina M.D.   On: 01/29/2017 12:13   Ct Head Wo Contrast  Result Date: 01/30/2017 CLINICAL DATA:  81 year old male with possible stroke. Resorption rest that resolved left upper extremity numbness and tingling. Dizziness and slurred speech. EXAM: CT HEAD WITHOUT CONTRAST TECHNIQUE: Contiguous axial images were obtained from the base of the skull through the vertex without intravenous contrast.  COMPARISON:  Head CT without contrast 01/29/2017. FINDINGS: Brain: No acute intracranial hemorrhage identified. No midline shift, mass effect, or evidence of intracranial mass lesion. Stable ventricle size and configuration. Stable gray-white matter differentiation throughout the brain. Patchy white matter hypodensity and chronic appearing left caudate lacunar infarct. Small chronic appearing right superior cerebellar infarcts. No acute or evolving infarct is identified. Vascular: Calcified atherosclerosis at the skull base. No suspicious intracranial vascular hyperdensity. Skull: Negative.  No acute osseous abnormality identified. Sinuses/Orbits: Visualized paranasal sinuses and mastoids are stable and well pneumatized. Other: Stable and negative orbit and scalp soft tissues. IMPRESSION: Stable non contrast CT appearance of the brain. Evidence of chronic small vessel disease but no acute or evolving cerebral infarct is identified by CT. Electronically Signed   By: Genevie Ann M.D.   On: 01/30/2017 12:54   Ct Head Wo Contrast  Result Date: 01/29/2017 CLINICAL DATA:  Dizziness and slurred speech. Chronic renal failure. History of melanoma EXAM: CT HEAD WITHOUT CONTRAST TECHNIQUE: Contiguous axial images were obtained from the base of the skull through the vertex without intravenous contrast. COMPARISON:  None. FINDINGS: Brain: There is mild diffuse atrophy. Left lateral ventricle is slightly larger than right lateral ventricle, a probable anatomic variant. There is no intracranial mass, hemorrhage, extra-axial fluid collection, or midline shift. There is patchy small vessel disease in the centra semiovale bilaterally. Elsewhere gray-white compartments appear normal. There is no evident acute appearing infarct. Vascular: There is no demonstrable hyperdense vessel. There is calcification in each carotid siphon region. There is also calcification in each distal vertebral artery. Skull: Bony calvarium appears intact.  Sinuses/Orbits: There is mild mucosal thickening in the medial right maxillary antrum. There is mucosal thickening in several ethmoid air cells bilaterally. Other visualized paranasal sinuses are clear. Orbits appear symmetric bilaterally. Other: Mastoid air cells are clear. There is debris in each external auditory canal. IMPRESSION: Mild atrophy with patchy periventricular small vessel disease. No intracranial mass, hemorrhage, or extra-axial fluid collection. No acute appearing infarct evident. There are foci of arteriovascular calcification bilaterally. There are areas of mild paranasal sinus disease. There is probable cerumen in each external auditory canal. Electronically Signed   By: Lowella Grip III M.D.   On: 01/29/2017 14:39  Assessment/Plan: Diagnosis: Right ?subcortical infarct with mild left hemiapresis 1. Does the need for close, 24 hr/day medical supervision in concert with the patient's rehab needs make it unreasonable for this patient to be served in a less intensive setting? Potentially 2. Co-Morbidities requiring supervision/potential complications: ESRD on HD, HTN, DM, chronic dCHF.  3. Due to bladder management, bowel management, safety, skin/wound care, disease management, medication administration, pain management and patient education, does the patient require 24 hr/day rehab nursing? Potentially 4. Does the patient require coordinated care of a physician, rehab nurse, PT (1-2 hrs/day, 5 days/week) and OT (1-2 hrs/day, 5 days/week), +/- SLP to address physical and functional deficits in the context of the above medical diagnosis(es)? Potentially Addressing deficits in the following areas: balance, endurance, locomotion, strength, transferring, bowel/bladder control, bathing, dressing, feeding, grooming, toileting and psychosocial support 5. Can the patient actively participate in an intensive therapy program of at least 3 hrs of therapy per day at least 5 days per week?  Yes 6. The potential for patient to make measurable gains while on inpatient rehab is good and fair 7. Anticipated functional outcomes upon discharge from inpatient rehab are modified independent  with PT, modified independent with OT, modified independent with SLP. 8. Estimated rehab length of stay to reach the above functional goals is: 5-7 days potentially.  9. Anticipated D/C setting: Home 10. Anticipated post D/C treatments: HH therapy and Outpatient therapy 11. Overall Rehab/Functional Prognosis: excellent  RECOMMENDATIONS: This patient's condition is appropriate for continued rehabilitative care in the following setting: see below Patient has agreed to participate in recommended program. Yes Note that insurance prior authorization may be required for reimbursement for recommended care.  Comment: Pt is demonstrating neurological progress. Would like to see performance with PT/OT today. Might be able to go [facility] with [facility] vs outpatient therapies.   Rehab Admissions Coordinator to follow up.  Thanks,  Meredith Staggers, MD, Mellody Drown    Cathlyn Parsons., PA-C 01/31/2017    Revision History                        Routing History

## 2017-02-06 NOTE — PMR Pre-admission (Signed)
PMR Admission Coordinator Pre-Admission Assessment  Patient: James David is an 81 y.o., male MRN: [id number] DOB: [birth year] Height: _0  (177.8 cm) Weight: 81.4 kg (179 lb 7.3 oz)              Insurance Information HMO: Yes   PPO:       PCP:       IPA:       80/20:       OTHER: # [id number] PRIMARY: [place]      Policy#: [id number]      Subscriber:  [place] Name: Vevelyn Royals      Phone#:  [phone]     Fax#: [phone] Pre-Cert#: [id number]      Employer: Retired Benefits:  Phone #: [phone]     Name:  Hanley Seamen. Date: 09/16/16     Deduct:  $0      Out of Pocket Max: $4400 (met $289.91)      Life Max: unlimited CIR: $345 days 1-5      SNF: $0 days 1-20; $160 days 21-48: $0 days 49-100 Outpatient: medical necessity     Co-Pay: $40/visit Home Health: 100%      Co-Pay: none DME: 80%     Co-Pay: 20% Providers: in Therapist, art Information    Name Newcomb L Spouse [phone]  [phone]     Current Medical History  Patient Admitting Diagnosis:  ? R subcortical infarct  History of Present Illness: A 81 y.o.right handed malewith history of diabetes mellitus, atrial fibrillation/second-degree heart block with pacemaker maintained on aspirin, end-stage renal disease with dialysis recently initiated 01/14/17,  hypertension. Per chart review patient lives with spouse independent prior to admission. One level townhome. Presented 01/29/2017 with dizziness, facial droop, slurred speech and left-sided weakness. Cranial CT scan showed evidence of chronic small vessel disease but no acute or evolving abnormalities. Patient did not receive TPA. Echocardiogram With ejection fraction of 12% grade 1 diastolic dysfunction. Carotid Doppler but no ICA stenosis. Neurology consulted suspect acute ischemic infarct and currently maintained on Plavix for CVA prophylaxisLater changed to Coumadin in light of history of atrial  fibrillation with follow-up per cardiology services. . Patient continues on dialysis as directed per renal services. Physical and occupational therapy evaluations completed with recommendations of physical medicine rehabilitation consult.Patient to be admitted for a comprehensive inpatient rehab program.   Total: 0=NIH  Past Medical History  Past Medical History:  Diagnosis Date  . Atrial fibrillation ([place])   . Chronic kidney disease, stage IV (severe) ([facility])    baseline creatinine 2-3  . DDD (degenerative disc disease)   . Diabetes mellitus without complication ([place])   . ESRD (end stage renal disease) on dialysis ([place])   . Hyperlipidemia   . Hypertension   . Melanoma ([place]) 2010   removed at [facility]  . Second degree heart block    a. s/p STJ dual chamber PPM - Dr [name]    Family History  family history includes Diabetes in his brother and mother; Heart attack in his father; Pulmonary embolism in his mother.  Prior Rehab/Hospitalizations: No previous rehab.  Has the patient had major surgery during 100 days prior to admission? No  Current Medications   Current Facility-Administered Medications:  .  doxercalciferol (HECTOROL) 4 MCG/2ML injection, , , ,  .  0.9 %  sodium chloride infusion, 999 mL/hr, Intravenous, Continuous, Bonnielee Haff, MD, Last Rate: 999 mL/hr at 02/02/17 1015, 999 mL/hr  at 02/02/17 1015 .  0.9 %  sodium chloride infusion, 100 mL, Intravenous, PRN, Ejigiri, Ogechi Grace, PA-C .  0.9 %  sodium chloride infusion, 100 mL, Intravenous, PRN, Ejigiri, Thomos Lemons, PA-C .  acetaminophen (TYLENOL) tablet 650 mg, 650 mg, Oral, Q4H PRN, 650 mg at 02/06/17 0727 **OR** acetaminophen (TYLENOL) solution 650 mg, 650 mg, Per Tube, Q4H PRN **OR** acetaminophen (TYLENOL) suppository 650 mg, 650 mg, Rectal, Q4H PRN, Samella Parr, NP .  alfuzosin (UROXATRAL) 24 hr tablet 10 mg, 10 mg, Oral, Q breakfast, Bonnielee Haff, MD, 10 mg at 02/05/17 0907 .  aspirin EC tablet 81 mg, 81  mg, Oral, Daily, Bonnielee Haff, MD, 81 mg at 02/05/17 0907 .  calcium acetate (PHOSLO) capsule 1,334 mg, 1,334 mg, Oral, TID WC, Samella Parr, NP, 1,334 mg at 02/05/17 1813 .  camphor-menthol (SARNA) lotion, , Topical, PRN, Bonnielee Haff, MD, 1 application at 75/10/25 0249 .  carvedilol (COREG) tablet 3.125 mg, 3.125 mg, Oral, BID WC, [name] Fire, MD, 3.125 mg at 02/05/17 1813 .  Darbepoetin Alfa (ARANESP) injection 60 mcg, 60 mcg, Intravenous, Q Tue-HD, Lynnda Child, PA-C, 60 mcg at 02/04/17 1821 .  doxercalciferol (HECTOROL) injection 4 mcg, 4 mcg, Intravenous, Q T,Th,Sa-HD, Loren Racer, PA-C, 4 mcg at 02/06/17 1045 .  ezetimibe (ZETIA) tablet 10 mg, 10 mg, Oral, Daily, Erin Hearing L, NP, 10 mg at 02/05/17 0909 .  feeding supplement (PRO-STAT SUGAR FREE 64) liquid 30 mL, 30 mL, Oral, BID, Loren Racer, PA-C, 30 mL at 02/05/17 2117 .  fenofibrate tablet 160 mg, 160 mg, Oral, Daily, Samella Parr, NP, 160 mg at 02/05/17 0907 .  finasteride (PROSCAR) tablet 5 mg, 5 mg, Oral, Daily, Erin Hearing L, NP, 5 mg at 02/05/17 0907 .  heparin injection 1,000 Units, 1,000 Units, Dialysis, PRN, Ejigiri, Thomos Lemons, PA-C .  insulin aspart (novoLOG) injection 0-5 Units, 0-5 Units, Subcutaneous, QHS, Samella Parr, NP, 2 Units at 02/05/17 2117 .  insulin aspart (novoLOG) injection 0-9 Units, 0-9 Units, Subcutaneous, TID WC, Samella Parr, NP, 3 Units at 02/05/17 1813 .  ipratropium (ATROVENT) 0.06 % nasal spray 2 spray, 2 spray, Each Nare, Daily, Samella Parr, NP, 2 spray at 02/05/17 1814 .  lidocaine (PF) (XYLOCAINE) 1 % injection 5 mL, 5 mL, Intradermal, PRN, Ejigiri, Thomos Lemons, PA-C .  lidocaine-prilocaine (EMLA) cream 1 application, 1 application, Topical, PRN, Ejigiri, Thomos Lemons, PA-C .  multivitamin (RENA-VIT) tablet 1 tablet, 1 tablet, Oral, QHS, Ernest Haber, PA-C, 1 tablet at 02/05/17 2117 .  ondansetron (ZOFRAN) injection 4 mg, 4 mg,  Intravenous, Q6H PRN, Bonnielee Haff, MD .  pentafluoroprop-tetrafluoroeth (GEBAUERS) aerosol 1 application, 1 application, Topical, PRN, Ejigiri, Thomos Lemons, PA-C .  polyvinyl alcohol (LIQUIFILM TEARS) 1.4 % ophthalmic solution 2 drop, 2 drop, Both Eyes, Daily PRN, Samella Parr, NP, 2 drop at 02/05/17 1222 .  sodium chloride 0.9 % bolus 1,000 mL, 1,000 mL, Intravenous, Once, Roney Jaffe, MD .  warfarin (COUMADIN) video, , Does not apply, Once, Bonnielee Haff, MD .  Warfarin - Pharmacist Dosing Inpatient, , Does not apply, q1800, Masters, Jake Church, [facility], Stopped at 02/03/17 1800  Patients Current Diet: Diet renal/carb modified with fluid restriction Diet-HS Snack? Nothing; Room service appropriate? Yes; Fluid consistency: Thin  Precautions / Restrictions Precautions Precautions: Fall Precaution Comments: watch BP and RR Restrictions Weight Bearing Restrictions: No   Has the patient had 2 or more falls or a fall with injury in  the past year?No  Prior Activity Level Community (5-7x/wk): Went out daily, was driving, is retired.  Home Assistive Devices / Equipment Home Assistive Devices/Equipment: None Home Equipment: Grab bars - tub/shower  Prior Device Use: Indicate devices/aids used by the patient prior to current illness, exacerbation or injury? None  Prior Functional Level Prior Function Level of Independence: Independent Comments: Reports he was not using AD   Self Care: Did the patient need help bathing, dressing, using the toilet or eating?  Independent  Indoor Mobility: Did the patient need assistance with walking from room to room (with or without device)? Independent  Stairs: Did the patient need assistance with internal or external stairs (with or without device)? Independent  Functional Cognition: Did the patient need help planning regular tasks such as shopping or remembering to take medications? Independent  Current Functional Level Cognition  Overall  Cognitive Status: Impaired/Different from baseline Current Attention Level: Selective Orientation Level: Oriented X4 Following Commands: Follows one step commands inconsistently Safety/Judgement: Decreased awareness of safety, Decreased awareness of deficits General Comments: Repetition required for safety. Pt with unsaef use of RW. Note that pt continues to run RW into items on L side. Poor orientation of self in RW    Extremity Assessment (includes Sensation/Coordination)  Upper Extremity Assessment: LUE deficits/detail, Generalized weakness LUE Deficits / Details: Signs of ataxia and dysmetria during movement.   Lower Extremity Assessment: Defer to PT evaluation RLE Deficits / Details: Ataxia noted with alternating toe tapping, and slight ataxia noted with heel to shin test. Grossly 3/5 throughout.  LLE Deficits / Details: Ataxia noted with alternating toe tapping, and slight ataxia noted with heel to shin test. Grossly 3/5 throughout.     ADLs  Overall ADL's : Needs assistance/impaired Eating/Feeding: Sitting, Supervision/ safety Grooming: Min guard, Standing Grooming Details (indicate cue type and reason): able to perform sitting EOB Upper Body Bathing: Supervision/ safety, Set up, Sitting Lower Body Bathing: Moderate assistance, Sit to/from stand Lower Body Bathing Details (indicate cue type and reason): is right hand dominant but states he always uses L and to wipe/wash buttocks.  able to perfrom in standing with mod. assistance for for balance and walker management but was able to bring LUE and reach desired area for washing Upper Body Dressing : Minimal assistance, Sitting Lower Body Dressing: Moderate assistance, Sit to/from stand Toilet Transfer: Minimal assistance, RW, Comfort height toilet, Ambulation, Grab bars Toilet Transfer Details (indicate cue type and reason): recliner placed outside of doorway and pt. able to amb. and pivot approx. 2 steps with mod a and cueing to  complete turn and reach for grab bar Toileting- Clothing Manipulation and Hygiene: Minimal assistance, Sit to/from stand Toileting - Clothing Manipulation Details (indicate cue type and reason): unable to attend to task and use either R/L hand to clean self Functional mobility during ADLs: Minimal assistance, Rolling walker, Cueing for safety General ADL Comments: Pt reports being more fatigued than what is his baseline    Mobility  Overal bed mobility: Needs Assistance Bed Mobility: Supine to Sit, Sit to Supine Rolling: Max assist Sidelying to sit: Min assist Supine to sit: Min assist Sit to supine: Min assist General bed mobility comments: pt OOB in chair upon arrival    Transfers  Overall transfer level: Needs assistance Equipment used: Rolling walker (2 wheeled) Transfers: Sit to/from Stand Sit to Stand: Min assist Stand pivot transfers: Max assist General transfer comment: vc for safety    Ambulation / Gait / Stairs / Wheelchair Mobility  Ambulation/Gait  Ambulation/Gait assistance: Min assist Ambulation Distance (Feet): 150 Feet Assistive device: Rolling walker (2 wheeled) Gait Pattern/deviations: Step-through pattern, Decreased stride length, Decreased dorsiflexion - right, Decreased dorsiflexion - left, Trunk flexed General Gait Details: cues for posture, proximity of RW, and stride length; pt able to improve bilat step length and DF with max cues; pt with difficulty negotiating objects on L side; mild gait deviations with horizontal head turns Gait velocity: Decreased Gait velocity interpretation: Below normal speed for age/gender    Posture / Balance Dynamic Sitting Balance Sitting balance - Comments: leaning posteriorly sitting on EOB Balance Overall balance assessment: Needs assistance Sitting-balance support: Bilateral upper extremity supported Sitting balance-Leahy Scale: Good Sitting balance - Comments: leaning posteriorly sitting on EOB Postural control:  Posterior lean Standing balance support: Bilateral upper extremity supported, During functional activity Standing balance-Leahy Scale: Poor Standing balance comment: UE support needed for balance    Special needs/care consideration BiPAP/CPAP No CPM No Continuous Drip IV No  Dialysis Yes        DaysT-TH-SAT Life Vest No Oxygen No Special Bed No Trach Size No Wound Vac (area) No      Skin Has itchy skin on his back, has a skin tear on right forearm with dressing                             Bowel mgmt: Last documented BM 02/04/17 Bladder mgmt: Urinary catheter in place Diabetic mgmt Yes, on oral medications at home.  On insulin in acute hospital.    Previous Home Environment Living Arrangements: Spouse/significant other Available Help at Discharge: Family, Available 24 hours/day Type of Home: Other(Comment) (townhome) Home Layout: One level Home Access: Level entry Bathroom Shower/Tub: Multimedia programmer: Handicapped height Bathroom Accessibility: Yes Home Care Services: No  Discharge Living Setting Plans for Discharge Living Setting: Patient's home, Other (Comment) (Lives with wife in a townhouse.) Type of Home at Discharge: Other (Comment) (Townhouse) Discharge Home Layout: One level Discharge Home Access: Stairs to enter Entrance Stairs-Number of Steps: 1 step Does the patient have any problems obtaining your medications?: No  Social/Family/Support Systems Patient Roles: Spouse (Has a wife.) Contact Information: Draxton Luu - wife  Anticipated Caregiver: wife Anticipated Caregiver's Contact Information: [name] - wife (h) [phone] (c) [phone] Ability/Limitations of Caregiver: Wife is retired and can provide supervision but no lifting due to her heart problems Caregiver Availability: 24/7 Discharge Plan Discussed with Primary Caregiver: Yes Is Caregiver In Agreement with Plan?: Yes Does Caregiver/Family have Issues with Lodging/Transportation while  Pt is in Rehab?: No  Goals/Additional Needs Patient/Family Goal for Rehab: PT/OT mod I goals Expected length of stay: 5-7 days (but suspect he may need 7-10 days) Cultural Considerations: None Dietary Needs: Renal, carb mod, fluid restricted, thin liquids Equipment Needs: TBD Special Service Needs: HD T-TH-Sat Pt/Family Agrees to Admission and willing to participate: Yes Program Orientation Provided & Reviewed with Pt/Caregiver Including Roles  & Responsibilities: Yes  Decrease burden of Care through IP rehab admission: N/A  Possible need for SNF placement upon discharge: Not anticipated  Patient Condition: This patient's medical and functional status has changed since the consult dated: 01/31/17 in which the Rehabilitation Physician determined and documented that the patient's condition is appropriate for intensive rehabilitative care in an inpatient rehabilitation facility. See "History of Present Illness" (above) for medical update. Functional changes are: Currently requiring mod assist for transfers and min assist to ambulate 150 feet RW. Patient's medical and functional  status update has been discussed with the Rehabilitation physician and patient remains appropriate for inpatient rehabilitation. Will admit to inpatient rehab today.  Preadmission Screen Completed By:  Retta Diones, 02/06/2017 11:17 AM ______________________________________________________________________   Discussed status with Dr. Letta Pate on 02/06/17 at  1117 and received telephone approval for admission today.  Admission Coordinator:  Retta Diones, time 1117/Date 02/06/17

## 2017-02-07 ENCOUNTER — Inpatient Hospital Stay (HOSPITAL_COMMUNITY): Payer: Medicare Other | Admitting: Occupational Therapy

## 2017-02-07 ENCOUNTER — Inpatient Hospital Stay (HOSPITAL_COMMUNITY): Payer: Medicare Other | Admitting: Physical Therapy

## 2017-02-07 ENCOUNTER — Inpatient Hospital Stay (HOSPITAL_COMMUNITY): Payer: Medicare Other

## 2017-02-07 DIAGNOSIS — I69398 Other sequelae of cerebral infarction: Secondary | ICD-10-CM

## 2017-02-07 DIAGNOSIS — R269 Unspecified abnormalities of gait and mobility: Secondary | ICD-10-CM

## 2017-02-07 LAB — GLUCOSE, CAPILLARY
GLUCOSE-CAPILLARY: 290 mg/dL — AB (ref 65–99)
GLUCOSE-CAPILLARY: 222 mg/dL — AB (ref 65–99)
Glucose-Capillary: 169 mg/dL — ABNORMAL HIGH (ref 65–99)
Glucose-Capillary: 248 mg/dL — ABNORMAL HIGH (ref 65–99)

## 2017-02-07 LAB — PROTIME-INR
INR: 2.44
Prothrombin Time: 26.9 seconds — ABNORMAL HIGH (ref 11.4–15.2)

## 2017-02-07 MED ORDER — WARFARIN SODIUM 2.5 MG PO TABS: 2.5000 mg | ORAL_TABLET | Freq: Once | ORAL | Status: DC

## 2017-02-07 MED ORDER — SODIUM CHLORIDE 0.9 % IV SOLN
62.5000 mg | INTRAVENOUS | Status: DC
  Administered 2017-02-15: 62.5 mg via INTRAVENOUS
  Filled 2017-02-07 (×4): qty 5

## 2017-02-07 MED ORDER — GLIMEPIRIDE 2 MG PO TABS
1.0000 mg | ORAL_TABLET | Freq: Every day | ORAL | Status: DC
  Administered 2017-02-07 – 2017-02-15 (×9): 1 mg via ORAL
  Filled 2017-02-07 (×9): qty 1

## 2017-02-07 MED ORDER — INSULIN ASPART 100 UNIT/ML ~~LOC~~ SOLN
0.0000 [IU] | Freq: Every day | SUBCUTANEOUS | Status: DC
  Administered 2017-02-07: 2 [IU] via SUBCUTANEOUS

## 2017-02-07 MED ORDER — WARFARIN SODIUM 2.5 MG PO TABS
2.5000 mg | ORAL_TABLET | Freq: Once | ORAL | Status: AC
  Administered 2017-02-07: 2.5 mg via ORAL
  Filled 2017-02-07: qty 1

## 2017-02-07 MED ORDER — INSULIN ASPART 100 UNIT/ML ~~LOC~~ SOLN
0.0000 [IU] | Freq: Three times a day (TID) | SUBCUTANEOUS | Status: DC
  Administered 2017-02-07 – 2017-02-08 (×2): 5 [IU] via SUBCUTANEOUS
  Administered 2017-02-09: 1 [IU] via SUBCUTANEOUS
  Administered 2017-02-09: 5 [IU] via SUBCUTANEOUS
  Administered 2017-02-10: 3 [IU] via SUBCUTANEOUS
  Administered 2017-02-10: 2 [IU] via SUBCUTANEOUS
  Administered 2017-02-11: 3 [IU] via SUBCUTANEOUS
  Administered 2017-02-11: 2 [IU] via SUBCUTANEOUS
  Administered 2017-02-12: 1 [IU] via SUBCUTANEOUS
  Administered 2017-02-12 – 2017-02-14 (×3): 2 [IU] via SUBCUTANEOUS
  Administered 2017-02-14: 1 [IU] via SUBCUTANEOUS

## 2017-02-07 NOTE — Progress Notes (Signed)
  Subjective/Complaints: Patient had shallow with OT. Slept poorly, but otherwise has no new complaints.  Review of systems denies chest pain, shortness of breath, nausea, vomiting, diarrhea or constipation  Objective: Vital Signs: Blood pressure (!) 137/52, pulse 95, temperature 98.2 F (36.8 C), temperature source Oral, resp. rate 16, height 5\' 10"  (1.778 m), weight 82.9 kg (182 lb 12.2 oz), SpO2 100 %. No results found. Results for orders placed or performed during the hospital encounter of 02/06/17 (from the past 72 hour(s))  Glucose, capillary     Status: Abnormal   Collection Time: 02/06/17  5:42 PM  Result Value Ref Range   Glucose-Capillary 174 (H) 65 - 99 mg/dL  Glucose, capillary     Status: Abnormal   Collection Time: 02/06/17 10:01 PM  Result Value Ref Range   Glucose-Capillary 216 (H) 65 - 99 mg/dL     HEENT: normal  Extremity:  Pulses positive and No Edema Skin:   Intact Neuro: Alert/Oriented, Normal Sensory and Abnormal Motor 4/5, bilateral deltoid by stress, grip, hip flexor and extensor, ankle dorsiflexor Musc/Skel:  Other No pain with upper extremity or lower extremity range of motion Gen. no acute distress   Assessment/Plan: 1. Functional deficits secondary to Right subcortical infarct which require 3+ hours per day of interdisciplinary therapy in a comprehensive inpatient rehab setting. Physiatrist is providing close team supervision and 24 hour management of active medical problems listed below. Physiatrist and rehab team continue to assess barriers to discharge/monitor patient progress toward functional and medical goals. FIM:                   Function - Comprehension Comprehension: Auditory Comprehension assist level: Follows complex conversation/direction with no assist, Understands complex 90% of the time/cues 10% of the time  Function - Expression Expression: Verbal Expression assist level: Expresses complex ideas: With no  assist  Function - Social Interaction Social Interaction assist level: Interacts appropriately with others - No medications needed.  Function - Problem Solving Problem solving assist level: Solves basic 90% of the time/requires cueing < 10% of the time  Function - Memory Patient normally able to recall (first 3 days only): Current season, Location of own room, That he or she is in a hospital  Medical Problem List and Plan: 1. Gait disorder secondary to right subcortical infarct Initiate rehabilitation PT, OT today 2. DVT Prophylaxis/Anticoagulation: Coumadin. Monitor for any bleeding episodes 3. Pain Management: Tylenol as needed 4. Mood: Provide emotional support 5. Neuropsych: This patient iscapable of making decisions on hisown behalf. 6. Skin/Wound Care: Routine skin checks 7. Fluids/Electrolytes/Nutrition: Routine I&O's with follow-up chemistry 8.Atrial fibrillation. Continue Coumadin therapy. Cardiac rate controlled. Follow-up cardiology services 9.ESRD. Continue hemodialysis as per renal services 10.Diabetes mellitus of peripheral neuropathy. Hemoglobin A1c 7.0.SSI.Check blood sugars before meals and at bedtime. Patient on Amaryl 4 mg twice a day prior to admission. Resume as needed 11.Acute on chronic anemia. Continue Aranesp 12.Hypertension. Coreg 3.125 mg twice a day 13.BPH. Uroxatral 10 mg daily, Proscar 5 mg daily, has urinary retention. Will DC Uroxatral and have another voiding trial 14.Hyperlipidemia. Zetia  LOS (Days) 1 A FACE TO FACE EVALUATION WAS PERFORMED  KIRSTEINS,ANDREW E 02/07/2017, 6:12 AM

## 2017-02-07 NOTE — Progress Notes (Signed)
Occupational Therapy Session Note  Patient Details  Name: James David MRN: [id number] Date of Birth: 14-Sep-1936  Today's Date: 02/07/2017 OT Individual Time: 2122-4825 OT Individual Time Calculation (min): 88 min    Short Term Goals: Week 1:  OT Short Term Goal 1 (Week 1): Pt will maintain standing balance with close supervision while obtaining clothing in prep for dressing  OT Short Term Goal 2 (Week 1): Pt will demonstrate intellecual awareness of dec attention to the left in functional tasks OT Short Term Goal 3 (Week 1): Pt will don LB clothing sit to stand with supervision  OT Short Term Goal 4 (Week 1): Pt will perform 3/3 grooming tasks in standing with supervision   Skilled Therapeutic Interventions/Progress Updates:    1:1. No pain reported. Focus of session on functional ambulation, endurance, HOH A LUE use and L attention. Pt ambulates to ADL apartment with MIN A, VC for foot clearance, stride length, and posture. Pt transfers onto standard bed with touching A and VC for RW management. Pt transfers onto TTB with supervision with  VC for sequencing and safety awareness. Pt voids bladder seated on BSC over toilet with touching A for clothing management. In kitchen pt  Gathers items from various shelves with HOH A for LUE to place on walker or reach for items 2/2 to apraxia in LUE often missing reaching for items when standing. At dynavision, pt completes 2, 2 min trials one in standing and one in seated. Pt demo increased difficulty using LUE in standing requiring 6 seconds longer to locate items on L lower quadrant when standing compared to sitting. Throughout session pt requires increased time for rest breaks. Exited session with pt seated in recliner with call light in reach and all needs met.  Therapy Documentation Precautions:  Precautions Precautions: Fall Restrictions Weight Bearing Restrictions: No ADL: ADL ADL Comments: see functional navigator  See Function Navigator  for Current Functional Status.   Therapy/Group: Individual Therapy  Tonny Branch 02/07/2017, 3:37 PM

## 2017-02-07 NOTE — Progress Notes (Signed)
[facility] Progress Note   [facility] Rounding Note  Subjective:   Transferred to rehab on 5/24 and in therapies this AM Had HD yesterday Weights confusing as I was told pre weight was 81.4, post weight recorded as 82.9 but nef UF off 1.6 liters???   Objective Vitals:   02/06/17 1625 02/07/17 0504 02/07/17 0753 02/07/17 0929  BP: (!) 144/68 (!) 137/52 (!) 141/54 122/60  Pulse: (!) 112 95 (!) 103 (!) 40  Resp: (!) 22 16 20    Temp: 98.5 F (36.9 C) 98.2 F (36.8 C)    TempSrc: Oral Oral    SpO2: 100% 100% 98% 93%  Weight: 82.9 kg (182 lb 12.2 oz)     Height: 5\' 10"  (1.778 m)      Physical Exam  Patient out of the room in therapies so no exam done Labs and all records reviewed   Recent Labs Lab 02/03/17 0227 02/04/17 0424 02/06/17 0800  NA 137 136 134*  K 3.7 4.0 4.2  CL 99* 101 100*  CO2 27 24 23   GLUCOSE 154* 185* 192*  BUN 58* 83* 68*  CREATININE 6.12* 7.08* 6.47*  CALCIUM 8.5* 8.2* 8.5*  PHOS  --  5.3* 4.9*    Recent Labs Lab 02/04/17 0424 02/06/17 0800  ALBUMIN 2.4* 2.6*    Recent Labs Lab 02/02/17 0412 02/03/17 0227 02/04/17 0424 02/05/17 0230 02/06/17 0800  WBC 12.3* 12.3* 10.4 10.8* 9.9  HGB 10.4* 10.0* 9.4* 9.5* 9.5*  HCT 31.9* 30.9* 29.5* 29.7* 29.0*  MCV 91.4 92.2 92.8 92.2 92.4  PLT 172 173 184 184 185    Recent Labs Lab 02/06/17 1321 02/06/17 1332 02/06/17 1742 02/06/17 2201 02/07/17 0630  GLUCAP 132* 140* 174* 216* 169*    Lab Results  Component Value Date   INR 2.44 02/07/2017   INR 1.98 02/06/2017   INR 2.08 02/05/2017    Ct Angio Head W Or Wo Contrast  Result Date: 02/04/2017 CLINICAL DATA:  Syncope yesterday after going to the bathroom. Brief left arm weakness. EXAM: CT ANGIOGRAPHY HEAD AND NECK TECHNIQUE: Multidetector CT imaging of the head and neck was performed using the standard protocol during bolus administration of intravenous contrast. Multiplanar CT image reconstructions and MIPs were obtained  to evaluate the vascular anatomy. Carotid stenosis measurements (when applicable) are obtained utilizing NASCET criteria, using the distal internal carotid diameter as the denominator. CONTRAST:  50 cc Isovue 370 intravenous COMPARISON:  Head CT from 2 days prior FINDINGS: CT HEAD FINDINGS Brain: No evidence of acute infarction, hemorrhage, hydrocephalus, extra-axial collection or mass lesion/mass effect. Chronic small vessel ischemia in the cerebral white matter with remote lacunar infarct of the left caudate head. Vascular: See below Skull: No acute or aggressive finding Sinuses/orbits: Bilateral cataract resection.  No acute finding. Review of the MIP images confirms the above findings CTA NECK FINDINGS Aortic arch: Atherosclerotic plaque of the aortic arch. Two vessel branching. No acute finding Right carotid system: Mild atheromatous calcified plaque at the common carotid bifurcation. Mild atheromatous wall thickening at the ICA bulb. No stenosis, ulceration, or dissection. Left carotid system: Moderate mixed density atherosclerotic plaque the common carotid bifurcation and ICA bulb with no flow limiting stenosis, ulceration, or dissection. Vertebral arteries: No proximal subclavian stenosis. There is severe atheromatous narrowing of the right vertebral artery origin. Moderate narrowing at the left vertebral artery origin. Intermittent likely atheromatous narrowings of the right V2 and V3 segment. The right vertebral artery is patent to the dura. Better patency of  the slightly dominant left vertebral artery. Skeleton: No acute or aggressive finding Other neck: 14 mm dense/hypervascular nodule in the right tracheoesophageal groove with probable polar vessel. Bilateral thyroid nodules, largest in the right upper pole measuring 25 mm. Probable cervicothoracic diffuse idiopathic skeletal hyperostosis. Multilevel ankylosis. Simple suboccipital lipoma. Upper chest: Negative Review of the MIP images confirms the above  findings CTA HEAD FINDINGS Anterior circulation: Atherosclerosis of both carotid siphons with at least moderate right right para clinoid segment stenosis. Moderate narrowing of the left supraclinoid ICA. Hypoplastic right A1 segment with superimposed proximal narrowing. No major branch occlusion is noted. Notable moderate narrowings of the left A2 segment. There is diffuse atheromatous irregularity of bilateral MCA branches. Posterior circulation: Faint intermittently seen flow in the right vertebral artery to the level of the basilar. A right PICA is not seen but there is a sizable right AICA that is filling. The basilar is small in the setting of left fetal type PCA. There is superimposed moderate to advanced narrowing at the midportion. Extensive atheromatous irregularity of bilateral PCA branches. Venous sinuses: Patent Anatomic variants: None significant Delayed phase: No abnormal intracranial enhancement Review of the MIP images confirms the above findings IMPRESSION: 1. Severely depressed flow in the right V4 segment likely due to stenosis at the dura. Small basilar in the setting of fetal type left PCA, with superimposed moderate to advanced mid basilar stenosis. 2. Bilateral intracranial ICA stenosis that is moderate to advanced. 3. Advanced diffuse atherosclerotic irregularity of medium size intracranial vessels. 4. Severe right and moderate left vertebral origin stenosis. 5. No flow limiting stenosis in the atherosclerotic cervical carotid circulation. 6. 14 mm mass in the right tracheoesophageal groove with features of parathyroid adenoma. Correlate with this calcium labs. 7. Bilateral thyroid nodules measuring up to 25 mm on the right. Electronically Signed   By: Monte Fantasia M.D.   On: 02/04/2017 10:26   Ct Angio Neck W Or Wo Contrast  Result Date: 02/04/2017 CLINICAL DATA:  Syncope yesterday after going to the bathroom. Brief left arm weakness. EXAM: CT ANGIOGRAPHY HEAD AND NECK TECHNIQUE:  Multidetector CT imaging of the head and neck was performed using the standard protocol during bolus administration of intravenous contrast. Multiplanar CT image reconstructions and MIPs were obtained to evaluate the vascular anatomy. Carotid stenosis measurements (when applicable) are obtained utilizing NASCET criteria, using the distal internal carotid diameter as the denominator. CONTRAST:  50 cc Isovue 370 intravenous COMPARISON:  Head CT from 2 days prior FINDINGS: CT HEAD FINDINGS Brain: No evidence of acute infarction, hemorrhage, hydrocephalus, extra-axial collection or mass lesion/mass effect. Chronic small vessel ischemia in the cerebral white matter with remote lacunar infarct of the left caudate head. Vascular: See below Skull: No acute or aggressive finding Sinuses/orbits: Bilateral cataract resection.  No acute finding. Review of the MIP images confirms the above findings CTA NECK FINDINGS Aortic arch: Atherosclerotic plaque of the aortic arch. Two vessel branching. No acute finding Right carotid system: Mild atheromatous calcified plaque at the common carotid bifurcation. Mild atheromatous wall thickening at the ICA bulb. No stenosis, ulceration, or dissection. Left carotid system: Moderate mixed density atherosclerotic plaque the common carotid bifurcation and ICA bulb with no flow limiting stenosis, ulceration, or dissection. Vertebral arteries: No proximal subclavian stenosis. There is severe atheromatous narrowing of the right vertebral artery origin. Moderate narrowing at the left vertebral artery origin. Intermittent likely atheromatous narrowings of the right V2 and V3 segment. The right vertebral artery is patent to the dura. Better  patency of the slightly dominant left vertebral artery. Skeleton: No acute or aggressive finding Other neck: 14 mm dense/hypervascular nodule in the right tracheoesophageal groove with probable polar vessel. Bilateral thyroid nodules, largest in the right upper  pole measuring 25 mm. Probable cervicothoracic diffuse idiopathic skeletal hyperostosis. Multilevel ankylosis. Simple suboccipital lipoma. Upper chest: Negative Review of the MIP images confirms the above findings CTA HEAD FINDINGS Anterior circulation: Atherosclerosis of both carotid siphons with at least moderate right right para clinoid segment stenosis. Moderate narrowing of the left supraclinoid ICA. Hypoplastic right A1 segment with superimposed proximal narrowing. No major branch occlusion is noted. Notable moderate narrowings of the left A2 segment. There is diffuse atheromatous irregularity of bilateral MCA branches. Posterior circulation: Faint intermittently seen flow in the right vertebral artery to the level of the basilar. A right PICA is not seen but there is a sizable right AICA that is filling. The basilar is small in the setting of left fetal type PCA. There is superimposed moderate to advanced narrowing at the midportion. Extensive atheromatous irregularity of bilateral PCA branches. Venous sinuses: Patent Anatomic variants: None significant Delayed phase: No abnormal intracranial enhancement Review of the MIP images confirms the above findings IMPRESSION: 1. Severely depressed flow in the right V4 segment likely due to stenosis at the dura. Small basilar in the setting of fetal type left PCA, with superimposed moderate to advanced mid basilar stenosis. 2. Bilateral intracranial ICA stenosis that is moderate to advanced. 3. Advanced diffuse atherosclerotic irregularity of medium size intracranial vessels. 4. Severe right and moderate left vertebral origin stenosis. 5. No flow limiting stenosis in the atherosclerotic cervical carotid circulation. 6. 14 mm mass in the right tracheoesophageal groove with features of parathyroid adenoma. Correlate with this calcium labs. 7. Bilateral thyroid nodules measuring up to 25 mm on the right. Electronically Signed   By: Monte Fantasia M.D.   On: 02/04/2017  10:26   Medications:  . aspirin EC  81 mg Oral Daily  . calcium acetate  1,334 mg Oral TID WC  . carvedilol  3.125 mg Oral BID WC  . [START ON 02/11/2017] darbepoetin (ARANESP) injection - DIALYSIS  60 mcg Intravenous Q Tue-HD  . [START ON 02/08/2017] doxercalciferol  4 mcg Intravenous Q T,Th,Sa-HD  . ezetimibe  10 mg Oral Daily  . feeding supplement (PRO-STAT SUGAR FREE 64)  30 mL Oral BID  . fenofibrate  160 mg Oral Daily  . finasteride  5 mg Oral Daily  . insulin aspart  0-5 Units Subcutaneous QHS  . ipratropium  2 spray Each Nare Daily  . multivitamin  1 tablet Oral QHS  . warfarin  2.5 mg Oral ONCE-1800  . Warfarin - Pharmacist Dosing Inpatient   Does not apply q1800   Dialysis Orders:  TTS [facility] HD time to 4 hours EDW 85.5kg will be lower at discharge  2/2.25 bath  Hep none  AVF RUA (declot/PTA by Dr. Posey Pronto at [facility] 01/28/17 as op (initally placed 01/2014 ) - Hectoral 59mcg IV q HD - Venofer 50mg  IV weekly  Assessment/Plan:  1. R subcortical vs brainstem stroke. Initially L sided weakness/slurred speech - Head CT neg. ECG A flutter. Echo - EF 70. Gr 1DD. No SOE. Carotid dopplers neg. Significant flow limiting intracranial stenoses by CT angio.  Anticoagulation recommended b/o AFF. Warfarin w/plavix bridge PLUS ASA d/t diffuse atherosclerosis  .  2. Gross Hematuria/Urinary retention - hematuria new this admission. Felt 2/2 enlarged prostate + foley trauma. Foley in to keep pressure  on prostate fossa.(Dr. Tresa Moore [facility] saw 5/19 - will need outpt f/u) Urine currently clear 3. ESRD - HD start 01/14/17  (Only had HD 5/8, 5/10, 5/15 ((cannulation issues)) w/5/15 tmt of 2'53" longest PTA) Will be up to 4 hour treatment time as of 5/26 4. Anemia - Hgb 9.5  Aranesp 60 mcg IV dosed 5/22. Adding weekly ferrlecit  5. MBD- Continue VDRA/Ca acetate binder.  6. H/o A. Fib/ Pacemaker- Warfarin initiated - dosing per pharmacy 7. Orthostatic hypotension.syncope No recurrence. On carvedilol  3.125mg  per cardiology recommendations.Need to avoid hypotension with HD. Neuro is OK with midodrine if needed. BP goal 120-150 per Neuro (with significant intracranial disease is at risk for further cerebral ischemia if hypotension) 8. Nutrition - Renal diet/ prostat for low albumin  9. Disposition - On rehab now.   [name], MD [facility] [phone] Pager 02/07/2017, 10:57 AM

## 2017-02-07 NOTE — IPOC Note (Signed)
Overall Plan of Care [facility]) Patient Details Name: James David MRN: [id number] DOB: [birth year]  Admitting Diagnosis: rt cva  Hospital Problems: Active Problems:   Subcortical infarction ([place])   Gait disturbance, post-stroke     Functional Problem List: Nursing Behavior, Bladder, Bowel, Safety, Skin Integrity  PT Balance, Behavior, Endurance, Motor, Perception, Safety, Skin Integrity  OT Balance, Behavior, Safety, Cognition, Endurance, Motor, Pain, Perception, Vision  SLP    TR         Basic ADL's: OT Grooming, Bathing, Dressing, Toileting     Advanced  ADL's: OT       Transfers: PT Bed Mobility, Bed to Chair, Car, Furniture, Futures trader, Metallurgist: PT Ambulation, Emergency planning/management officer, Stairs     Additional Impairments: OT Fuctional Use of Upper Extremity  SLP        TR      Anticipated Outcomes Item Anticipated Outcome  Self Feeding n/a  Swallowing      Basic self-care  supevision   Toileting  supervision    Bathroom Transfers supervision  Bowel/Bladder  Cont bowel/chronic bladder retention will assist with bladder program.   Transfers  Mod I with LRAD   Locomotion  Mod I at household level   Communication     Cognition     Pain  Pain tolerated at 3 or less, staff administer prn pain regimen as needed  Safety/Judgment  Free of falls and injury,   Therapy Plan: PT Intensity: Minimum of 1-2 x/day ,45 to 90 minutes PT Frequency: 5 out of 7 days PT Duration Estimated Length of Stay: 7-10 OT Intensity: Minimum of 1-2 x/day, 45 to 90 minutes OT Frequency: 5 out of 7 days OT Duration/Estimated Length of Stay: ~7-10         Team Interventions: Nursing Interventions Patient/Family Education, Bladder Management, Pain Management, Medication Management, Psychosocial Support  PT interventions Ambulation/gait training, Training and development officer, Cognitive remediation/compensation, Community reintegration, Discharge planning,  Disease management/prevention, DME/adaptive equipment instruction, Functional electrical stimulation, Functional mobility training, Neuromuscular re-education, Pain management, Patient/family education, Psychosocial support, Splinting/orthotics, Stair training, Therapeutic Activities, Therapeutic Exercise, UE/LE Strength taining/ROM, UE/LE Coordination activities, Visual/perceptual remediation/compensation, Wheelchair propulsion/positioning  OT Interventions Balance/vestibular training, Discharge planning, Functional electrical stimulation, Pain management, Self Care/advanced ADL retraining, Therapeutic Activities, UE/LE Coordination activities, Visual/perceptual remediation/compensation, Therapeutic Exercise, Skin care/wound managment, Patient/family education, Functional mobility training, Disease mangement/prevention, Cognitive remediation/compensation, Community reintegration, Engineer, drilling, Neuromuscular re-education, Psychosocial support, Splinting/orthotics, UE/LE Strength taining/ROM  SLP Interventions    TR Interventions    SW/CM Interventions Discharge Planning, Psychosocial Support, Patient/Family Education    Team Discharge Planning: Destination: PT-Home ,OT- Home , SLP-  Projected Follow-up: PT-Home health PT, OT-  Home health OT, SLP-  Projected Equipment Needs: PT-Rolling walker with 5" wheels, To be determined, OT- None recommended by OT, SLP-  Equipment Details: PT- , OT-  Patient/family involved in discharge planning: PT- Patient,  OT-Patient, SLP-   MD ELOS: 7-10 days Medical Rehab Prognosis:  Excellent Assessment: The patient has been admitted for CIR therapies with the diagnosis of right CVA. The team will be addressing functional mobility, strength, stamina, balance, safety, adaptive techniques and equipment, self-care, bowel and bladder mgt, patient and caregiver education, NMR, visual-perceptual awareness, activity tolerance. Goals have been set at mod I  for mobility and self-care.    Meredith Staggers, MD, FAAPMR      See Team Conference Notes for weekly updates to the plan of care

## 2017-02-07 NOTE — Progress Notes (Signed)
ANTICOAGULATION CONSULT NOTE - Follow Up Consult  Pharmacy Consult for coumadin Indication: atrial fibrillation in setting of acute stroke/TIA    Allergies  Allergen Reactions  . Amlodipine Besylate Swelling    angioedema  . Lisinopril Other (See Comments)    Renal insufficiency  . Statins Other (See Comments)    Muscle aches    Patient Measurements: Height: 5\' 10"  (177.8 cm) Weight: 182 lb 12.2 oz (82.9 kg) IBW/kg (Calculated) : 73 Heparin Dosing Weight:   Vital Signs: Temp: 98.2 F (36.8 C) (05/25 0504) Temp Source: Oral (05/25 0504) BP: 137/52 (05/25 0504) Pulse Rate: 95 (05/25 0504)  Labs:  Recent Labs  02/05/17 0230 02/06/17 0301 02/06/17 0800  HGB 9.5*  --  9.5*  HCT 29.7*  --  29.0*  PLT 184  --  185  LABPROT 23.7* 22.8*  --   INR 2.08 1.98  --   CREATININE  --   --  6.47*    Estimated Creatinine Clearance: 9.7 mL/min (A) (by C-G formula based on SCr of 6.47 mg/dL (H)).   Medications:  Scheduled:  . aspirin EC  81 mg Oral Daily  . calcium acetate  1,334 mg Oral TID WC  . carvedilol  3.125 mg Oral BID WC  . [START ON 02/11/2017] darbepoetin (ARANESP) injection - DIALYSIS  60 mcg Intravenous Q Tue-HD  . [START ON 02/08/2017] doxercalciferol  4 mcg Intravenous Q T,Th,Sa-HD  . ezetimibe  10 mg Oral Daily  . feeding supplement (PRO-STAT SUGAR FREE 64)  30 mL Oral BID  . fenofibrate  160 mg Oral Daily  . finasteride  5 mg Oral Daily  . insulin aspart  0-5 Units Subcutaneous QHS  . ipratropium  2 spray Each Nare Daily  . multivitamin  1 tablet Oral QHS  . Warfarin - Pharmacist Dosing Inpatient   Does not apply q1800   Infusions:    Assessment: 81 yo male with atrial fibrillation in setting of acute stroke/TIA is currently on therapeutic coumadin.  INR today is up to 2.44.  Goal of Therapy:  INR 2-3 Monitor platelets by anticoagulation protocol: Yes   Plan:  - Warfarin 2.5 mg x 1 (patient previously received 5 mg for few doses and INR jumped by  a bit) - INR in am  Exa Bomba, Tsz-Yin 02/07/2017,7:16 AM

## 2017-02-07 NOTE — Evaluation (Signed)
Physical Therapy Assessment and Plan  Patient Details  Name: James David MRN: [id number] Date of Birth: 1935-12-06  PT Diagnosis: Abnormality of gait, Coordination disorder and Hemiplegia dominant Rehab Potential: Excellent ELOS: 7-10   Today's Date: 02/07/2017 PT Individual Time: 1000-1100 PT Individual Time Calculation (min): 60 min    Problem List:  Patient Active Problem List   Diagnosis Date Noted  . Subcortical infarction ([place]) 02/06/2017  . Intracranial vascular stenosis 02/04/2017  . Syncope   . Orthostatic hypotension   . Stroke ([place]) 02/01/2017  . TIA (transient ischemic attack) 01/29/2017  . Hypertension 01/29/2017  . CKD (chronic kidney disease) stage V requiring chronic dialysis ([place]) 01/29/2017  . DDD (degenerative disc disease) 01/29/2017  . HLD (hyperlipidemia) 01/29/2017  . Second degree heart block 01/29/2017  . Atrial flutter ([place]) 01/29/2017  . ESRD (end stage renal disease) on dialysis ([place])   . Melanoma of skin ([place]) 03/13/2016  . Atrial flutter ([place]) 06/26/2015  . Pacemaker 10/13/2014  . End stage renal disease ([place]) 07/15/2014  . Chronic diastolic heart failure ([place]) 03/24/2014  . Second-degree heart block 12/13/2013  . Chronic kidney disease, stage IV (severe) ([place]) 12/13/2013  . Essential hypertension 12/13/2013  . Diabetes mellitus due to underlying condition with diabetic nephropathy ([place]) 12/13/2013  . Hyperlipidemia 12/13/2013    Past Medical History:  Past Medical History:  Diagnosis Date  . Atrial fibrillation ([place])   . Chronic kidney disease, stage IV (severe) ([facility])    baseline creatinine 2-3  . DDD (degenerative disc disease)   . Diabetes mellitus without complication ([place])   . ESRD (end stage renal disease) on dialysis ([place])   . Hyperlipidemia   . Hypertension   . Melanoma ([place]) 2010   removed at [facility]  . Second degree heart block    a. s/p STJ dual chamber PPM - Dr [name]   Past Surgical History:  Past Surgical History:   Procedure Laterality Date  . AV FISTULA PLACEMENT Right 05/31/2014   Procedure: Right Arm Brachiocephalic ARTERIOVENOUS FISTULA CREATION  ;  Surgeon: Conrad Brooks, MD;  Location: [place];  Service: Vascular;  Laterality: Right;  . CATARACT EXTRACTION W/ INTRAOCULAR LENS  IMPLANT, BILATERAL    . COLONOSCOPY    . EXCISION MELANOMA WITH SENTINEL LYMPH NODE BIOPSY Right 02/08/2016   Procedure: WIDE EXCISION RIGHT SHOULDER MELANOMA WITH RIGHT SENTINEL LYMPH NODE BIOPSY;  Surgeon: [name], MD;  Location: [place];  Service: General;  Laterality: Right;  . LAMINECTOMY    . PERMANENT PACEMAKER INSERTION N/A 01/20/2014   STJ Assurity dual chamber pacemaker implanted by Dr [name] for 2nd degree AV block    Assessment & Plan Clinical Impression: Patient is a 81 y.o.right handed malewith history of diabetes mellitus, atrial fibrillation/second-degree heart block with pacemaker maintained on aspirin, end-stage renal disease with dialysis recently initiated, hypertension. Per chart review patient lives with spouse independent prior to admission. One level townhome. Presented 01/29/2017 with dizziness, facial droop, slurred speech and left-sided weakness. Cranial CT scan showed evidence of chronic small vessel disease but no acute or evolving abnormalities. Patient did not receive TPA. Echocardiogram With ejection fraction of 03% grade 1 diastolic dysfunction. Carotid Doppler but no ICA stenosis. Neurology consulted suspect acute ischemic infarct and currently maintained on Plavix for CVA prophylaxisLater changed to Coumadin in light of history of atrial fibrillation with follow-up per cardiology services. . Patient continues on dialysis as directed per renal services.  Patient transferred to CIR on 02/06/2017 .   Patient  currently requires min with mobility secondary to muscle weakness, decreased cardiorespiratoy endurance, impaired timing and sequencing and decreased coordination, decreased attention to left and  decreased sitting balance, decreased standing balance, decreased postural control, hemiplegia and decreased balance strategies.  Prior to hospitalization, patient was independent  with mobility and lived with Spouse in a Other(Condo) home.  Home access is  Level entry.  Patient will benefit from skilled PT intervention to maximize safe functional mobility, minimize fall risk and decrease caregiver burden for planned discharge home with intermittent assist.  Anticipate patient will benefit from follow up [facility] at discharge.  PT - End of Session Activity Tolerance: Tolerates 10 - 20 min activity with multiple rests Endurance Deficit: Yes Endurance Deficit Description: poor endurance with faitgue PT Assessment Rehab Potential (ACUTE/IP ONLY): Excellent PT Patient demonstrates impairments in the following area(s): Balance;Behavior;Endurance;Motor;Perception;Safety;Skin Integrity PT Transfers Functional Problem(s): Bed Mobility;Bed to Chair;Car;Furniture;Floor PT Locomotion Functional Problem(s): Ambulation;Wheelchair Mobility;Stairs PT Plan PT Intensity: Minimum of 1-2 x/day ,45 to 90 minutes PT Frequency: 5 out of 7 days PT Duration Estimated Length of Stay: 7-10 PT Treatment/Interventions: Ambulation/gait training;Balance/vestibular training;Cognitive remediation/compensation;Community reintegration;Discharge planning;Disease management/prevention;DME/adaptive equipment instruction;Functional electrical stimulation;Functional mobility training;Neuromuscular re-education;Pain management;Patient/family education;Psychosocial support;Splinting/orthotics;Stair training;Therapeutic Activities;Therapeutic Exercise;UE/LE Strength taining/ROM;UE/LE Coordination activities;Visual/perceptual remediation/compensation;Wheelchair propulsion/positioning PT Transfers Anticipated Outcome(s): Mod I with LRAD  PT Locomotion Anticipated Outcome(s): Mod I at household level  PT Recommendation Recommendations for Other  Services: Therapeutic Recreation consult Therapeutic Recreation Interventions: Pet therapy;Outing/community reintergration Follow Up Recommendations: Home health PT Patient destination: Home Equipment Recommended: Rolling walker with 5" wheels;To be determined  Skilled Therapeutic Intervention Pt received sitting in WC and agreeable to PT. PT instructed patient in PT Evaluation and initiated treatment intervention; see below for results. PT educated patient in [place], rehab potential, rehab goals, and discharge recommendations. Pt also instructed in Gait with RW x 127f with min-supervision assist with improved O2 Sats as well as increased gait speed and step length.Car transfer performed with min assist from PT as well as moderate cues for sit>pivot technique and proper UE placement for safety and improved success. Patient returned too room and [facility] and all needs met.       PT Evaluation Precautions/Restrictions Precautions Precautions: Fall Restrictions Weight Bearing Restrictions: No General   Vital SignsTherapy Vitals Pulse Rate: (!) 40 BP: 122/60 Patient Position (if appropriate): Sitting Oxygen Therapy SpO2: 93 % O2 Device: Not Delivered Pain Pain Assessment Pain Assessment: No/denies pain Pain Score: 0-No pain Home Living/Prior Functioning Home Living Available Help at Discharge: Family;Available 24 hours/day Type of Home: Other(Comment) Home Access: Level entry Home Layout: One level Bathroom Shower/Tub: WMultimedia programmer Handicapped height  Lives With: Spouse Prior Function Level of Independence: Independent with basic ADLs;Independent with homemaking with ambulation;Independent with homemaking with wheelchair;Independent with gait  Able to Take Stairs?: Yes Driving: Yes Vocation: Retired Leisure: Hobbies-yes (Comment) Comments: go to [employer] Vision/Perception  Vision - Assessment Eye Alignment: (P) Within  Functional Limits Ocular Range of Motion: Within Functional Limits Alignment/Gaze Preference: Within Defined Limits Tracking/Visual Pursuits: Decreased smoothness of horizontal tracking Saccades: Overshoots Perception Perception: Impaired Inattention/Neglect: Does not attend to left visual field Praxis Praxis: Impaired Praxis Impairment Details: Motor planning  Cognition Overall Cognitive Status: Impaired/Different from baseline Arousal/Alertness: Awake/alert Orientation Level: Oriented X4 Attention: Sustained Sustained Attention: Appears intact Memory: Impaired Memory Impairment: Decreased recall of new information Awareness: Impaired Awareness Impairment: Intellectual impairment;Emergent impairment Problem Solving: Impaired Problem Solving Impairment: Functional basic;Functional complex Safety/Judgment: Impaired Sensation Sensation  Light Touch: Appears Intact Stereognosis: Appears Intact Hot/Cold: Appears Intact Proprioception: Appears Intact Coordination Gross Motor Movements are Fluid and Coordinated: No Fine Motor Movements are Fluid and Coordinated: No Coordination and Movement Description: slight left UE tremor Heel Shin Test: Mild dysmetria in the LLE Motor  Motor Motor: Hemiplegia Motor - Skilled Clinical Observations: mild dysmetria in the LLE and LUE  Mobility Bed Mobility Bed Mobility: Rolling Right;Rolling Left;Sit to Supine;Supine to Sit Rolling Right: 5: Supervision Rolling Right Details: Verbal cues for technique;Verbal cues for precautions/safety Rolling Left: 5: Supervision Rolling Left Details: Verbal cues for precautions/safety;Verbal cues for technique Supine to Sit: 5: Supervision Supine to Sit Details: Verbal cues for precautions/safety;Verbal cues for technique Sit to Supine: 5: Supervision Sit to Supine - Details: Verbal cues for precautions/safety;Verbal cues for technique Transfers Transfers: Yes Sit to Stand: 4: Min assist Sit to Stand  Details: Verbal cues for precautions/safety;Verbal cues for technique;Verbal cues for sequencing;Verbal cues for safe use of DME/AE;Visual cues for safe use of DME/AE Stand to Sit: 4: Min assist Stand Pivot Transfers: 4: Min assist Stand Pivot Transfer Details: Visual cues/gestures for sequencing;Visual cues for safe use of DME/AE;Verbal cues for sequencing;Verbal cues for technique;Verbal cues for precautions/safety Locomotion  Ambulation Ambulation: Yes Ambulation/Gait Assistance: 4: Min assist Ambulation Distance (Feet): 75 Feet Assistive device: None Ambulation/Gait Assistance Details: Verbal cues for technique;Verbal cues for precautions/safety;Verbal cues for gait pattern Gait Gait velocity: decreased  Wheelchair Mobility Wheelchair Mobility: Yes Wheelchair Assistance: 4: Psychologist, counselling Assistance Details: Verbal cues for technique;Verbal cues for precautions/safety;Verbal cues for sequencing;Verbal cues for gait pattern Wheelchair Propulsion: Both upper extremities Wheelchair Parts Management: Needs assistance Distance: 115f   Trunk/Postural Assessment  Cervical Assessment Cervical Assessment: Exceptions to [facility](forward flexed) Thoracic Assessment Thoracic Assessment: Exceptions to [facility](forward flexed) Lumbar Assessment Lumbar Assessment: Exceptions to [facility](posterior pelvic tilt) Postural Control Postural Control: Deficits on evaluation (posterior lean/ delayed balance reactions)  Balance Balance Balance Assessed: Yes Dynamic Sitting Balance Sitting balance - Comments: leaning posteriorly sitting on EOB Static Standing Balance Static Standing - Balance Support: No upper extremity supported Static Standing - Level of Assistance: 5: Stand by assistance Dynamic Standing Balance Dynamic Standing - Balance Support: No upper extremity supported Dynamic Standing - Level of Assistance: 4: Min assist Dynamic Standing - Balance Activities: Reaching for objects Dynamic  Standing - Comments: dressing sit to stand Extremity Assessment  RUE Assessment RUE Assessment: Within Functional Limits (grossly 4/5) LUE Assessment LUE Assessment: Within Functional Limits (grossly 4/5) RLE Assessment RLE Assessment: Within Functional Limits LLE Assessment LLE Assessment: Within Functional Limits (mild decrease in hip flexion 4+/5 all others tested 5/5)   See Function Navigator for Current Functional Status.   Refer to Care Plan for Long Term Goals  Recommendations for other services: Therapeutic Recreation  Kitchen group and Outing/community reintegration  Discharge Criteria: Patient will be discharged from PT if patient refuses treatment 3 consecutive times without medical reason, if treatment goals not met, if there is a change in medical status, if patient makes no progress towards goals or if patient is discharged from hospital.  The above assessment, treatment plan, treatment alternatives and goals were discussed and mutually agreed upon: by patient  ALorie Phenix5/25/2018, 12:08 PM

## 2017-02-07 NOTE — Care Management Note (Signed)
[facility] Individual Statement of Services  Patient Name:  James David  Date:  02/07/2017  Welcome to the [facility].  Our goal is to provide you with an individualized program based on your diagnosis and situation, designed to meet your specific needs.  With this comprehensive rehabilitation program, you will be expected to participate in at least 3 hours of rehabilitation therapies Monday-Friday, with modified therapy programming on the weekends.  Your rehabilitation program will include the following services:  Physical Therapy (PT), Occupational Therapy (OT), Speech Therapy (ST), 24 hour per day rehabilitation nursing, Case Management (Social Worker), Rehabilitation Medicine, Nutrition Services and Pharmacy Services  Weekly team conferences will be held on Wednesday to discuss your progress.  Your Social Worker will talk with you frequently to get your input and to update you on team discussions.  Team conferences with you and your family in attendance may also be held.  Expected length of stay: 8-10 days Overall anticipated outcome: supervision-mod/i level   Depending on your progress and recovery, your program may change. Your Social Worker will coordinate services and will keep you informed of any changes. Your Social Worker's name and contact numbers are listed  below.  The following services may also be recommended but are not provided by the [facility] will be made to provide these services after discharge if needed.  Arrangements include referral to agencies that provide these services.  Your insurance has been verified to be:  [employer]-Medicare Your primary doctor is:  Lona Kettle Pertinent information will be shared with your doctor and your insurance company.  Social Worker:  Ovidio Kin, [place] or [phone]  Information discussed with and copy given to patient by: Elease Hashimoto, 02/07/2017, 10:52 AM

## 2017-02-07 NOTE — Evaluation (Signed)
Occupational Therapy Assessment and Plan  Patient Details  Name: James David MRN: [id number] Date of Birth: February 21, 1936  OT Diagnosis: acute pain, cognitive deficits and muscle weakness (generalized) Rehab Potential: Rehab Potential (ACUTE ONLY): Good ELOS: ~7-10days   Today's Date: 02/07/2017 OT Individual Time: 0800-0900 OT Individual Time Calculation (min): 60 min     Problem List:  Patient Active Problem List   Diagnosis Date Noted  . Subcortical infarction ([place]) 02/06/2017  . Intracranial vascular stenosis 02/04/2017  . Syncope   . Orthostatic hypotension   . Stroke ([place]) 02/01/2017  . TIA (transient ischemic attack) 01/29/2017  . Hypertension 01/29/2017  . CKD (chronic kidney disease) stage V requiring chronic dialysis ([place]) 01/29/2017  . DDD (degenerative disc disease) 01/29/2017  . HLD (hyperlipidemia) 01/29/2017  . Second degree heart block 01/29/2017  . Atrial flutter ([place]) 01/29/2017  . ESRD (end stage renal disease) on dialysis ([place])   . Melanoma of skin ([place]) 03/13/2016  . Atrial flutter ([place]) 06/26/2015  . Pacemaker 10/13/2014  . End stage renal disease ([place]) 07/15/2014  . Chronic diastolic heart failure ([place]) 03/24/2014  . Second-degree heart block 12/13/2013  . Chronic kidney disease, stage IV (severe) ([place]) 12/13/2013  . Essential hypertension 12/13/2013  . Diabetes mellitus due to underlying condition with diabetic nephropathy ([place]) 12/13/2013  . Hyperlipidemia 12/13/2013    Past Medical History:  Past Medical History:  Diagnosis Date  . Atrial fibrillation ([place])   . Chronic kidney disease, stage IV (severe) (HCC)    baseline creatinine 2-3  . DDD (degenerative disc disease)   . Diabetes mellitus without complication ([place])   . ESRD (end stage renal disease) on dialysis ([place])   . Hyperlipidemia   . Hypertension   . Melanoma ([place]) 2010   removed at [facility]  . Second degree heart block    a. s/p STJ dual chamber PPM - Dr [name]   Past Surgical  History:  Past Surgical History:  Procedure Laterality Date  . AV FISTULA PLACEMENT Right 05/31/2014   Procedure: Right Arm Brachiocephalic ARTERIOVENOUS FISTULA CREATION  ;  Surgeon: Conrad Betances, MD;  Location: [place];  Service: Vascular;  Laterality: Right;  . CATARACT EXTRACTION W/ INTRAOCULAR LENS  IMPLANT, BILATERAL    . COLONOSCOPY    . EXCISION MELANOMA WITH SENTINEL LYMPH NODE BIOPSY Right 02/08/2016   Procedure: WIDE EXCISION RIGHT SHOULDER MELANOMA WITH RIGHT SENTINEL LYMPH NODE BIOPSY;  Surgeon: [name], MD;  Location: [place];  Service: General;  Laterality: Right;  . LAMINECTOMY    . PERMANENT PACEMAKER INSERTION N/A 01/20/2014   STJ Assurity dual chamber pacemaker implanted by Dr [name] for 2nd degree AV block    Assessment & Plan Clinical Impression: Patient is a 81 y.o. year old male right handed malewith history of diabetes mellitus, atrial fibrillation/second-degree heart block with pacemaker maintained on aspirin, end-stage renal disease with dialysis recently initiated, hypertension. Per chart review patient lives with spouse independent prior to admission. One level townhome. Presented 01/29/2017 with dizziness, facial droop, slurred speech and left-sided weakness. Cranial CT scan showed evidence of chronic small vessel disease but no acute or evolving abnormalities. Patient did not receive TPA. Echocardiogram With ejection fraction of 96% grade 1 diastolic dysfunction. Carotid Doppler but no ICA stenosis. Neurology consulted suspect acute ischemic infarct and currently maintained on Plavix for CVA prophylaxisLater changed to Coumadin in light of history of atrial fibrillation with follow-up per cardiology services. . Patient continues on dialysis as directed per renal services. Physical and  occupational therapy evaluations completed with recommendations of physical medicine rehabilitation consult.Patient was admitted for a comprehensive rehab program.  Patient states that  despite having end-stage renal disease, he does produce urine. He had urinary retention and a Foley catheter was placed several days ago  Patient transferred to [place] on 02/06/2017 .    Patient currently requires min to mod  A with basic self-care skills and basic mobility without a RW secondary to muscle weakness, decreased cardiorespiratoy endurance, unbalanced muscle activation, decreased coordination and decreased motor planning, decreased attention to left, decreased attention, decreased awareness, decreased problem solving, decreased safety awareness, decreased memory and delayed processing and decreased standing balance, decreased postural control, decreased balance strategies and difficulty maintaining precautions.  Prior to hospitalization, patient could complete ADL with independent .  Patient will benefit from skilled intervention to decrease level of assist with basic self-care skills and increase independence with basic self-care skills prior to discharge home with care partner.  Anticipate patient will require intermittent supervision and follow up outpatient.  OT - End of Session Activity Tolerance: Tolerates 30+ min activity with multiple rests Endurance Deficit: Yes Endurance Deficit Description: poor endurance with faitgue OT Assessment Rehab Potential (ACUTE ONLY): Good OT Patient demonstrates impairments in the following area(s): Balance;Behavior;Safety;Cognition;Endurance;Motor;Pain;Perception;Vision OT Basic ADL's Functional Problem(s): Grooming;Bathing;Dressing;Toileting OT Transfers Functional Problem(s): Toilet;Tub/Shower OT Additional Impairment(s): Fuctional Use of Upper Extremity OT Plan OT Intensity: Minimum of 1-2 x/day, 45 to 90 minutes OT Frequency: 5 out of 7 days OT Duration/Estimated Length of Stay: ~10-12 days OT Treatment/Interventions: Balance/vestibular training;Discharge planning;Functional electrical stimulation;Pain management;Self Care/advanced ADL  retraining;Therapeutic Activities;UE/LE Coordination activities;Visual/perceptual remediation/compensation;Therapeutic Exercise;Skin care/wound managment;Patient/family education;Functional mobility training;Disease mangement/prevention;Cognitive remediation/compensation;Community reintegration;DME/adaptive equipment instruction;Neuromuscular re-education;Psychosocial support;Splinting/orthotics;UE/LE Strength taining/ROM OT Self Feeding Anticipated Outcome(s): n/a OT Basic Self-Care Anticipated Outcome(s): supevision  OT Toileting Anticipated Outcome(s): supervision  OT Bathroom Transfers Anticipated Outcome(s): supervision OT Recommendation Recommendations for Other Services: Neuropsych consult Patient destination: Home Follow Up Recommendations: Home health OT Equipment Recommended: None recommended by OT   Skilled Therapeutic Intervention 1:1 OT eval initiated with OT goals, purpose and role discussed with pt. Self care retraining at shower level. Pt ambulated without RW with min to mod A HHA to the bathroom. Pt bathed and dressed with min to mod A. Focus on dynamic standing balance and sit to stand with maintaining forward weight shift. Pt with left inattention throughout functional tasks but required max cuing for intellectual awareness. Pt with difficulty of manipulation of clothing with hands requiring more time to perform dressing. Pt does demonstrate decr processing skills and decr functional problem solve.  Discussed with Physical therapist and PA about a SLP eval.  Pt did reported feeling fatigued with any and all activity and some complaints of SOB but O2 sats remained >90% on RA.   Left pt up in the w/c in prep for next therapy.    OT Evaluation Precautions/Restrictions  Precautions Precautions: Fall Restrictions Weight Bearing Restrictions: No General Chart Reviewed: Yes Family/Caregiver Present: No Vital Signs Therapy Vitals Pulse Rate: (!) 40 Resp: 20 BP:  122/60 Patient Position (if appropriate): Sitting Oxygen Therapy SpO2: 93 % O2 Device: Not Delivered Pain Pain Assessment Pain Assessment: No/denies pain Pain Score: 0-No pain Home Living/Prior Functioning Home Living Family/patient expects to be discharged to:: Private residence Living Arrangements: Spouse/significant other Available Help at Discharge: Family, Available 24 hours/day Type of Home: Other(Comment) Home Access: Level entry Home Layout: One level Bathroom Shower/Tub: Multimedia programmer: Handicapped height  Lives With: Spouse Prior Function Level of  Independence: Independent with basic ADLs, Independent with homemaking with ambulation, Independent with homemaking with wheelchair, Independent with gait  Able to Take Stairs?: Yes Driving: Yes Vocation: Retired Leisure: Hobbies-yes (Comment) Comments: go to [employer] ADL ADL ADL Comments: see functional navigator Vision Baseline Vision/History: Wears glasses Wears Glasses: Reading only Patient Visual Report: No change from baseline Vision Assessment?: Yes Eye Alignment: (P) Within Functional Limits Ocular Range of Motion: Within Functional Limits Alignment/Gaze Preference: Within Defined Limits Tracking/Visual Pursuits: Decreased smoothness of horizontal tracking Saccades: Overshoots Visual Fields: Left visual field deficit Perception  Perception: Impaired Inattention/Neglect: Does not attend to left visual field Praxis Praxis: Impaired Praxis Impairment Details: Motor planning Cognition Overall Cognitive Status: Impaired/Different from baseline Arousal/Alertness: Awake/alert Orientation Level: Person;Situation;Place Person: Oriented Place: Oriented Situation: Oriented Year: 2018 Month: May Day of Week: Correct Memory: Impaired Memory Impairment: Decreased recall of new information Immediate Memory Recall: Sock;Blue;Bed Memory Recall: Blue;Bed Memory Recall Blue: With Cue Memory Recall  Bed: With Cue Attention: Sustained Sustained Attention: Appears intact Awareness: Impaired Awareness Impairment: Intellectual impairment;Emergent impairment Problem Solving: Impaired Problem Solving Impairment: Functional basic;Functional complex Safety/Judgment: Impaired Sensation Sensation Light Touch: Appears Intact Stereognosis: Appears Intact Hot/Cold: Appears Intact Proprioception: Appears Intact Coordination Gross Motor Movements are Fluid and Coordinated: No Fine Motor Movements are Fluid and Coordinated: No Coordination and Movement Description: slight left UE tremor Heel Shin Test: Mild dysmetria in the LLE Motor  Motor Motor: Hemiplegia Motor - Skilled Clinical Observations: mild dysmetria in the LLE and LUE Mobility  Transfers Transfers: Sit to Stand;Stand to Sit Sit to Stand: 4: Min assist Stand to Sit: 4: Min assist  Trunk/Postural Assessment  Cervical Assessment Cervical Assessment:  (forward flexed) Thoracic Assessment Thoracic Assessment:  (forward flexed) Lumbar Assessment Lumbar Assessment:  (posterior pelvic tilt) Postural Control Postural Control:  (posterior lean/ delayed balance reactions)  Balance Balance Balance Assessed: Yes Dynamic Sitting Balance Sitting balance - Comments: leaning posteriorly sitting on EOB Static Standing Balance Static Standing - Level of Assistance: 4: Min assist Dynamic Standing Balance Dynamic Standing - Balance Support: During functional activity Dynamic Standing - Level of Assistance: 3: Mod assist Dynamic Standing - Balance Activities: Reaching for objects Dynamic Standing - Comments: dressing sit to stand Extremity/Trunk Assessment RUE Assessment RUE Assessment: Within Functional Limits (grossly 4/5) LUE Assessment LUE Assessment: Within Functional Limits (grossly 4/5)   See Function Navigator for Current Functional Status.   Refer to Care Plan for Long Term Goals  Recommendations for other services:  Neuropsych   Discharge Criteria: Patient will be discharged from OT if patient refuses treatment 3 consecutive times without medical reason, if treatment goals not met, if there is a change in medical status, if patient makes no progress towards goals or if patient is discharged from hospital.  The above assessment, treatment plan, treatment alternatives and goals were discussed and mutually agreed upon: by patient  Nicoletta Ba 02/07/2017, 10:32 AM

## 2017-02-07 NOTE — Progress Notes (Signed)
Social Work Assessment and Plan Social Work Assessment and Plan  Patient Details  Name: James David MRN: [id number] Date of Birth: November 27, 1935  Today's Date: 02/07/2017  Problem List:  Patient Active Problem List   Diagnosis Date Noted  . Gait disturbance, post-stroke   . Subcortical infarction ([place]) 02/06/2017  . Intracranial vascular stenosis 02/04/2017  . Syncope   . Orthostatic hypotension   . Stroke ([place]) 02/01/2017  . TIA (transient ischemic attack) 01/29/2017  . Hypertension 01/29/2017  . CKD (chronic kidney disease) stage V requiring chronic dialysis ([place]) 01/29/2017  . DDD (degenerative disc disease) 01/29/2017  . HLD (hyperlipidemia) 01/29/2017  . Second degree heart block 01/29/2017  . Atrial flutter ([place]) 01/29/2017  . ESRD (end stage renal disease) on dialysis ([place])   . Melanoma of skin ([place]) 03/13/2016  . Atrial flutter ([place]) 06/26/2015  . Pacemaker 10/13/2014  . End stage renal disease ([place]) 07/15/2014  . Chronic diastolic heart failure ([place]) 03/24/2014  . Second-degree heart block 12/13/2013  . Chronic kidney disease, stage IV (severe) ([place]) 12/13/2013  . Essential hypertension 12/13/2013  . Diabetes mellitus due to underlying condition with diabetic nephropathy ([place]) 12/13/2013  . Hyperlipidemia 12/13/2013   Past Medical History:  Past Medical History:  Diagnosis Date  . Atrial fibrillation ([place])   . Chronic kidney disease, stage IV (severe) (HCC)    baseline creatinine 2-3  . DDD (degenerative disc disease)   . Diabetes mellitus without complication ([place])   . ESRD (end stage renal disease) on dialysis ([place])   . Hyperlipidemia   . Hypertension   . Melanoma ([place]) 2010   removed at [facility]  . Second degree heart block    a. s/p STJ dual chamber PPM - Dr [name]   Past Surgical History:  Past Surgical History:  Procedure Laterality Date  . AV FISTULA PLACEMENT Right 05/31/2014   Procedure: Right Arm Brachiocephalic ARTERIOVENOUS FISTULA CREATION  ;   Surgeon: Conrad Ramsey, MD;  Location: [place];  Service: Vascular;  Laterality: Right;  . CATARACT EXTRACTION W/ INTRAOCULAR LENS  IMPLANT, BILATERAL    . COLONOSCOPY    . EXCISION MELANOMA WITH SENTINEL LYMPH NODE BIOPSY Right 02/08/2016   Procedure: WIDE EXCISION RIGHT SHOULDER MELANOMA WITH RIGHT SENTINEL LYMPH NODE BIOPSY;  Surgeon: [name], MD;  Location: [place];  Service: General;  Laterality: Right;  . LAMINECTOMY    . PERMANENT PACEMAKER INSERTION N/A 01/20/2014   STJ Assurity dual chamber pacemaker implanted by Dr [name] for 2nd degree AV block   Social History:  reports that he has never smoked. He has never used smokeless tobacco. He reports that he does not drink alcohol or use drugs.  Family / Support Systems Marital Status: Married Patient Roles: Spouse, Parent Spouse/Significant Other: [name]  [phone]  [phone] Children: Two local children and one in [place] Other Supports: Frineds and church members Anticipated Caregiver: wife Ability/Limitations of Caregiver: Wife has cardiac issues and can not provide physical care Caregiver Availability: 24/7 Family Dynamics: Close knit family they have three children who are supportive and involved. Pt reports they have nieghbors and church members who are supportive and visit also.  Social History Preferred language: English Religion: Baptist Cultural Background: No issues Education: [employer] Read: Yes Write: Yes Employment Status: Retired Freight forwarder Issues: No issues Guardian/Conservator: None-according to MD pt is capable of making his own decisions while here.   Abuse/Neglect Physical Abuse: Denies Verbal Abuse: Denies Sexual Abuse: Denies Exploitation of patient/patient's resources: Denies Self-Neglect:  Denies  Emotional Status Pt's affect, behavior adn adjustment status: Pt is motivated to do well here and has already made progress since his stroke happened. He has always been  independent and wants to get back to this level. He feels he can [facility] the goal he has set for himself with his progress already. Recent Psychosocial Issues: CVA and recent start of HD still adjusting to this Pyschiatric History: No issues or history deferred depression screen due to seems to be coping appropriately and aboe to verbalize his feelings. He did become tearful when talking about his daughter in [place]. May benefit from seeing neuro-psych while here. Will get input from team. Substance Abuse History: No issues  Patient / Family Perceptions, Expectations & Goals Pt/Family understanding of illness & functional limitations: Pt and wife can explain his stroke and deficits. Both have spoken with the MD and feel their questions and concerns have been addressed. Both want him to continue progressing in therapies. Premorbid pt/family roles/activities: Husband, Father, grandfather, retiree, church member, retiree, Social research officer, government Anticipated changes in roles/activities/participation: resume Pt/family expectations/goals: Pt states: " I want to be able to take care of myself and do waht I was doing before this."  Wife states: " I hope he will be mobile and able to physically do for himself."  [employer]: Other (Comment) ([employer] in [place], [place] & [place]) Premorbid Home Care/DME Agencies: None Transportation available at discharge: Wife-pt was driving himself to HD Resource referrals recommended: Neuropsychology, Support group (specify)  Discharge Planning Living Arrangements: Spouse/significant other Support Systems: Spouse/significant other, Children, Friends/neighbors, Immunologist, Other relatives Type of Residence: Private residence Insurance Resources: Multimedia programmer (specify) Primary school teacher) Financial Resources: Radio broadcast assistant Screen Referred: No Living Expenses: Own Money Management: Spouse, Patient Does the patient have any problems obtaining your  medications?: No Home Management: Wife Patient/Family Preliminary Plans: Return home with wife who can be there but not provide physical assist due to cardiac issues. They have two local children who are involved and supportive, will try to assist wife some but work and have small children. Social Work Anticipated Follow Up Needs: HH/OP, Support Group  Clinical Impression Pleasant gentleman who has had to deal with much change recently with starting HD and now having a stroke. He is improving which is encouraging to him since his stroke. He should do well here with how high level he is. His family is supportive and willing to assist. Will ask team for input if would benefit form seeing neuro-psych while here. Anticipate a short length of stay.  Elease Hashimoto 02/07/2017, 1:23 PM

## 2017-02-08 ENCOUNTER — Inpatient Hospital Stay (HOSPITAL_COMMUNITY): Payer: Medicare Other | Admitting: Physical Therapy

## 2017-02-08 ENCOUNTER — Inpatient Hospital Stay (HOSPITAL_COMMUNITY): Payer: Medicare Other | Admitting: Speech Pathology

## 2017-02-08 ENCOUNTER — Inpatient Hospital Stay (HOSPITAL_COMMUNITY): Payer: Medicare Other

## 2017-02-08 DIAGNOSIS — I639 Cerebral infarction, unspecified: Secondary | ICD-10-CM

## 2017-02-08 DIAGNOSIS — R269 Unspecified abnormalities of gait and mobility: Secondary | ICD-10-CM

## 2017-02-08 DIAGNOSIS — I69398 Other sequelae of cerebral infarction: Secondary | ICD-10-CM

## 2017-02-08 DIAGNOSIS — E1142 Type 2 diabetes mellitus with diabetic polyneuropathy: Secondary | ICD-10-CM

## 2017-02-08 LAB — GLUCOSE, CAPILLARY
GLUCOSE-CAPILLARY: 120 mg/dL — AB (ref 65–99)
Glucose-Capillary: 267 mg/dL — ABNORMAL HIGH (ref 65–99)
Glucose-Capillary: 78 mg/dL (ref 65–99)

## 2017-02-08 LAB — PROTIME-INR
INR: 4.49
Prothrombin Time: 42.6 seconds — ABNORMAL HIGH (ref 11.4–15.2)

## 2017-02-08 NOTE — Plan of Care (Signed)
Problem: RH BLADDER ELIMINATION Goal: RH STG MANAGE BLADDER WITH ASSISTANCE STG Manage Bladder With Assistance  Outcome: Not Progressing No urine output >12 hours. Pt. Scanned;  in and out cath done.

## 2017-02-08 NOTE — Progress Notes (Signed)
ANTICOAGULATION CONSULT NOTE - Follow Up Consult  Pharmacy Consult for coumadin Indication: atrial fibrillation in setting of acute stroke/TIA    Allergies  Allergen Reactions  . Amlodipine Besylate Swelling    angioedema  . Lisinopril Other (See Comments)    Renal insufficiency  . Statins Other (See Comments)    Muscle aches    Patient Measurements: Height: 5\' 10"  (177.8 cm) Weight: 176 lb 9.4 oz (80.1 kg) IBW/kg (Calculated) : 73 Heparin Dosing Weight:   Vital Signs: Temp: 99 F (37.2 C) (05/26 0454) Temp Source: Oral (05/26 0454) BP: 141/63 (05/26 0454) Pulse Rate: 93 (05/26 0454)  Labs:  Recent Labs  02/06/17 0301 02/06/17 0800 02/07/17 0654 02/08/17 0438  HGB  --  9.5*  --   --   HCT  --  29.0*  --   --   PLT  --  185  --   --   LABPROT 22.8*  --  26.9* 42.6*  INR 1.98  --  2.44 4.49*  CREATININE  --  6.47*  --   --     Estimated Creatinine Clearance: 9.7 mL/min (A) (by C-G formula based on SCr of 6.47 mg/dL (H)).   Medications:  Scheduled:  . aspirin EC  81 mg Oral Daily  . calcium acetate  1,334 mg Oral TID WC  . carvedilol  3.125 mg Oral BID WC  . [START ON 02/11/2017] darbepoetin (ARANESP) injection - DIALYSIS  60 mcg Intravenous Q Tue-HD  . doxercalciferol  4 mcg Intravenous Q T,Th,Sa-HD  . ezetimibe  10 mg Oral Daily  . feeding supplement (PRO-STAT SUGAR FREE 64)  30 mL Oral BID  . fenofibrate  160 mg Oral Daily  . finasteride  5 mg Oral Daily  . glimepiride  1 mg Oral Q breakfast  . insulin aspart  0-5 Units Subcutaneous QHS  . insulin aspart  0-9 Units Subcutaneous TID WC  . ipratropium  2 spray Each Nare Daily  . multivitamin  1 tablet Oral QHS  . Warfarin - Pharmacist Dosing Inpatient   Does not apply q1800   Infusions:  . ferric gluconate (FERRLECIT/NULECIT) IV      Assessment: 81 yo male with atrial fibrillation in setting of acute stroke/TIA on warfarin now supratherapeutic at 4.49. No S/Sx bleeding per RN at this time, MD  aware.  Goal of Therapy:  INR 2-3 Monitor platelets by anticoagulation protocol: Yes   Plan:  -Hold warfarin tonight -Follow-up INR in am -Watch S/Sx bleeding closely  Arrie Senate, PharmD PGY-1 Pharmacy Resident Pager: [phone] 02/08/2017

## 2017-02-08 NOTE — Progress Notes (Signed)
James David is a 81 y.o. male 1935-12-11 [id number]  Subjective: No new complaints. No new problems. Slept well. Feeling OK.  Objective: Vital signs in last 24 hours: Temp:  [98.1 F (36.7 C)-99 F (37.2 C)] 99 F (37.2 C) (05/26 0454) Pulse Rate:  [93-112] 95 (05/26 1100) Resp:  [18-19] 18 (05/26 0454) BP: (120-141)/(57-66) 126/57 (05/26 1100) SpO2:  [84 %-100 %] 94 % (05/26 1330) Weight:  [176 lb 9.4 oz (80.1 kg)] 176 lb 9.4 oz (80.1 kg) (05/26 0454) Weight change: -6 lb 2.8 oz (-2.8 kg) Last BM Date: 02/08/17  Intake/Output from previous day: 05/25 0701 - 05/26 0700 In: 678 [P.O.:678] Out: 450 [Urine:450] Last cbgs: CBG (last 3)   Recent Labs  02/07/17 2048 02/08/17 0645 02/08/17 1208  GLUCAP 222* 120* 267*     Physical Exam General: No apparent distress   HEENT: not dry Lungs: Normal effort. Lungs clear to auscultation, no crackles or wheezes. Cardiovascular: Regular rate and rhythm, no edema Abdomen: S/NT/ND; BS(+) Musculoskeletal:  unchanged Neurological: No new neurological deficits Wounds: N/A    Skin: clear  Aging changes Mental state: Alert, oriented, cooperative    Lab Results: BMET    Component Value Date/Time   NA 134 (L) 02/06/2017 0800   NA 138 10/17/2016 1303   K 4.2 02/06/2017 0800   K 5.1 10/17/2016 1303   CL 100 (L) 02/06/2017 0800   CO2 23 02/06/2017 0800   CO2 18 (L) 10/17/2016 1303   GLUCOSE 192 (H) 02/06/2017 0800   GLUCOSE 197 (H) 10/17/2016 1303   BUN 68 (H) 02/06/2017 0800   BUN 71.0 (H) 10/17/2016 1303   CREATININE 6.47 (H) 02/06/2017 0800   CREATININE 5.9 (HH) 10/17/2016 1303   CALCIUM 8.5 (L) 02/06/2017 0800   CALCIUM 8.9 10/17/2016 1303   GFRNONAA 7 (L) 02/06/2017 0800   GFRAA 8 (L) 02/06/2017 0800   CBC    Component Value Date/Time   WBC 9.9 02/06/2017 0800   RBC 3.14 (L) 02/06/2017 0800   HGB 9.5 (L) 02/06/2017 0800   HGB 12.0 (L) 10/17/2016 1303   HCT 29.0 (L) 02/06/2017 0800   HCT 34.4 (L) 10/17/2016  1303   PLT 185 02/06/2017 0800   PLT 198 10/17/2016 1303   MCV 92.4 02/06/2017 0800   MCV 86.6 10/17/2016 1303   MCH 30.3 02/06/2017 0800   MCHC 32.8 02/06/2017 0800   RDW 14.1 02/06/2017 0800   RDW 13.3 10/17/2016 1303   LYMPHSABS 1.0 01/29/2017 1139   LYMPHSABS 1.0 10/17/2016 1303   MONOABS 0.7 01/29/2017 1139   MONOABS 0.5 10/17/2016 1303   EOSABS 0.1 01/29/2017 1139   EOSABS 0.2 10/17/2016 1303   BASOSABS 0.0 01/29/2017 1139   BASOSABS 0.0 10/17/2016 1303    Studies/Results: No results found.  Medications: I have reviewed the patient's current medications.  Assessment/Plan:   1. CVA - started CIR. Coumadin, BP meds 2. DVT proph - on Coumadin 3. A fib - Coumadin 4. ESRD - on HD 5. Anemia - Araneso 6. DM2. Amaryl po 7. BPH - Uroxatral po 8. Dyslipidemia - Zetia 9. Constipaton -  LOC    Length of stay, days: 2  Walker Kehr , MD 02/08/2017, 4:27 PM

## 2017-02-08 NOTE — Progress Notes (Signed)
[facility] Progress Note   [facility] Rounding Note  Subjective:   Transferred to rehab on 5/24  Working on cognitive testing this AM Says is SOB (despite clear lungs, absence of edema)   Objective Vitals:   02/07/17 0929 02/07/17 1645 02/07/17 1820 02/08/17 0454  BP: 122/60 131/60 120/66 (!) 141/63  Pulse: (!) 40 (!) 112 (!) 111 93  Resp:  19 19 18   Temp:  98.1 F (36.7 C)  99 F (37.2 C)  TempSrc:  Oral  Oral  SpO2: 93% 100%  99%  Weight:    80.1 kg (176 lb 9.4 oz)  Height:       Physical Exam In room Working on cognitive testing w/therapist VS as noted Lungs clear Tachy around 100 Distant heart sounds S1S2 No S3 or murmur No rub No edema of LE's  R AVF + bruit. Persistent bruising   Recent Labs Lab 02/03/17 0227 02/04/17 0424 02/06/17 0800  NA 137 136 134*  K 3.7 4.0 4.2  CL 99* 101 100*  CO2 27 24 23   GLUCOSE 154* 185* 192*  BUN 58* 83* 68*  CREATININE 6.12* 7.08* 6.47*  CALCIUM 8.5* 8.2* 8.5*  PHOS  --  5.3* 4.9*    Recent Labs Lab 02/04/17 0424 02/06/17 0800  ALBUMIN 2.4* 2.6*    Recent Labs Lab 02/02/17 0412 02/03/17 0227 02/04/17 0424 02/05/17 0230 02/06/17 0800  WBC 12.3* 12.3* 10.4 10.8* 9.9  HGB 10.4* 10.0* 9.4* 9.5* 9.5*  HCT 31.9* 30.9* 29.5* 29.7* 29.0*  MCV 91.4 92.2 92.8 92.2 92.4  PLT 172 173 184 184 185    Recent Labs Lab 02/07/17 0630 02/07/17 1228 02/07/17 1632 02/07/17 2048 02/08/17 0645  GLUCAP 169* 248* 290* 222* 120*    Lab Results  Component Value Date   INR 4.49 (HH) 02/08/2017   INR 2.44 02/07/2017   INR 1.98 02/06/2017    Ct Angio Head W Or Wo Contrast  Result Date: 02/04/2017 CLINICAL DATA:  Syncope yesterday after going to the bathroom. Brief left arm weakness. EXAM: CT ANGIOGRAPHY HEAD AND NECK TECHNIQUE: Multidetector CT imaging of the head and neck was performed using the standard protocol during bolus administration of intravenous contrast. Multiplanar CT image reconstructions  and MIPs were obtained to evaluate the vascular anatomy. Carotid stenosis measurements (when applicable) are obtained utilizing NASCET criteria, using the distal internal carotid diameter as the denominator. CONTRAST:  50 cc Isovue 370 intravenous COMPARISON:  Head CT from 2 days prior FINDINGS: CT HEAD FINDINGS Brain: No evidence of acute infarction, hemorrhage, hydrocephalus, extra-axial collection or mass lesion/mass effect. Chronic small vessel ischemia in the cerebral white matter with remote lacunar infarct of the left caudate head. Vascular: See below Skull: No acute or aggressive finding Sinuses/orbits: Bilateral cataract resection.  No acute finding. Review of the MIP images confirms the above findings CTA NECK FINDINGS Aortic arch: Atherosclerotic plaque of the aortic arch. Two vessel branching. No acute finding Right carotid system: Mild atheromatous calcified plaque at the common carotid bifurcation. Mild atheromatous wall thickening at the ICA bulb. No stenosis, ulceration, or dissection. Left carotid system: Moderate mixed density atherosclerotic plaque the common carotid bifurcation and ICA bulb with no flow limiting stenosis, ulceration, or dissection. Vertebral arteries: No proximal subclavian stenosis. There is severe atheromatous narrowing of the right vertebral artery origin. Moderate narrowing at the left vertebral artery origin. Intermittent likely atheromatous narrowings of the right V2 and V3 segment. The right vertebral artery is patent to the dura. Better patency  of the slightly dominant left vertebral artery. Skeleton: No acute or aggressive finding Other neck: 14 mm dense/hypervascular nodule in the right tracheoesophageal groove with probable polar vessel. Bilateral thyroid nodules, largest in the right upper pole measuring 25 mm. Probable cervicothoracic diffuse idiopathic skeletal hyperostosis. Multilevel ankylosis. Simple suboccipital lipoma. Upper chest: Negative Review of the MIP  images confirms the above findings CTA HEAD FINDINGS Anterior circulation: Atherosclerosis of both carotid siphons with at least moderate right right para clinoid segment stenosis. Moderate narrowing of the left supraclinoid ICA. Hypoplastic right A1 segment with superimposed proximal narrowing. No major branch occlusion is noted. Notable moderate narrowings of the left A2 segment. There is diffuse atheromatous irregularity of bilateral MCA branches. Posterior circulation: Faint intermittently seen flow in the right vertebral artery to the level of the basilar. A right PICA is not seen but there is a sizable right AICA that is filling. The basilar is small in the setting of left fetal type PCA. There is superimposed moderate to advanced narrowing at the midportion. Extensive atheromatous irregularity of bilateral PCA branches. Venous sinuses: Patent Anatomic variants: None significant Delayed phase: No abnormal intracranial enhancement Review of the MIP images confirms the above findings IMPRESSION: 1. Severely depressed flow in the right V4 segment likely due to stenosis at the dura. Small basilar in the setting of fetal type left PCA, with superimposed moderate to advanced mid basilar stenosis. 2. Bilateral intracranial ICA stenosis that is moderate to advanced. 3. Advanced diffuse atherosclerotic irregularity of medium size intracranial vessels. 4. Severe right and moderate left vertebral origin stenosis. 5. No flow limiting stenosis in the atherosclerotic cervical carotid circulation. 6. 14 mm mass in the right tracheoesophageal groove with features of parathyroid adenoma. Correlate with this calcium labs. 7. Bilateral thyroid nodules measuring up to 25 mm on the right. Electronically Signed   By: Monte Fantasia M.D.   On: 02/04/2017 10:26   Ct Angio Neck W Or Wo Contrast  Result Date: 02/04/2017 CLINICAL DATA:  Syncope yesterday after going to the bathroom. Brief left arm weakness. EXAM: CT ANGIOGRAPHY  HEAD AND NECK TECHNIQUE: Multidetector CT imaging of the head and neck was performed using the standard protocol during bolus administration of intravenous contrast. Multiplanar CT image reconstructions and MIPs were obtained to evaluate the vascular anatomy. Carotid stenosis measurements (when applicable) are obtained utilizing NASCET criteria, using the distal internal carotid diameter as the denominator. CONTRAST:  50 cc Isovue 370 intravenous COMPARISON:  Head CT from 2 days prior FINDINGS: CT HEAD FINDINGS Brain: No evidence of acute infarction, hemorrhage, hydrocephalus, extra-axial collection or mass lesion/mass effect. Chronic small vessel ischemia in the cerebral white matter with remote lacunar infarct of the left caudate head. Vascular: See below Skull: No acute or aggressive finding Sinuses/orbits: Bilateral cataract resection.  No acute finding. Review of the MIP images confirms the above findings CTA NECK FINDINGS Aortic arch: Atherosclerotic plaque of the aortic arch. Two vessel branching. No acute finding Right carotid system: Mild atheromatous calcified plaque at the common carotid bifurcation. Mild atheromatous wall thickening at the ICA bulb. No stenosis, ulceration, or dissection. Left carotid system: Moderate mixed density atherosclerotic plaque the common carotid bifurcation and ICA bulb with no flow limiting stenosis, ulceration, or dissection. Vertebral arteries: No proximal subclavian stenosis. There is severe atheromatous narrowing of the right vertebral artery origin. Moderate narrowing at the left vertebral artery origin. Intermittent likely atheromatous narrowings of the right V2 and V3 segment. The right vertebral artery is patent to the dura.  Better patency of the slightly dominant left vertebral artery. Skeleton: No acute or aggressive finding Other neck: 14 mm dense/hypervascular nodule in the right tracheoesophageal groove with probable polar vessel. Bilateral thyroid nodules,  largest in the right upper pole measuring 25 mm. Probable cervicothoracic diffuse idiopathic skeletal hyperostosis. Multilevel ankylosis. Simple suboccipital lipoma. Upper chest: Negative Review of the MIP images confirms the above findings CTA HEAD FINDINGS Anterior circulation: Atherosclerosis of both carotid siphons with at least moderate right right para clinoid segment stenosis. Moderate narrowing of the left supraclinoid ICA. Hypoplastic right A1 segment with superimposed proximal narrowing. No major branch occlusion is noted. Notable moderate narrowings of the left A2 segment. There is diffuse atheromatous irregularity of bilateral MCA branches. Posterior circulation: Faint intermittently seen flow in the right vertebral artery to the level of the basilar. A right PICA is not seen but there is a sizable right AICA that is filling. The basilar is small in the setting of left fetal type PCA. There is superimposed moderate to advanced narrowing at the midportion. Extensive atheromatous irregularity of bilateral PCA branches. Venous sinuses: Patent Anatomic variants: None significant Delayed phase: No abnormal intracranial enhancement Review of the MIP images confirms the above findings IMPRESSION: 1. Severely depressed flow in the right V4 segment likely due to stenosis at the dura. Small basilar in the setting of fetal type left PCA, with superimposed moderate to advanced mid basilar stenosis. 2. Bilateral intracranial ICA stenosis that is moderate to advanced. 3. Advanced diffuse atherosclerotic irregularity of medium size intracranial vessels. 4. Severe right and moderate left vertebral origin stenosis. 5. No flow limiting stenosis in the atherosclerotic cervical carotid circulation. 6. 14 mm mass in the right tracheoesophageal groove with features of parathyroid adenoma. Correlate with this calcium labs. 7. Bilateral thyroid nodules measuring up to 25 mm on the right. Electronically Signed   By: Monte Fantasia M.D.   On: 02/04/2017 10:26   Medications: . ferric gluconate (FERRLECIT/NULECIT) IV     . aspirin EC  81 mg Oral Daily  . calcium acetate  1,334 mg Oral TID WC  . carvedilol  3.125 mg Oral BID WC  . [START ON 02/11/2017] darbepoetin (ARANESP) injection - DIALYSIS  60 mcg Intravenous Q Tue-HD  . doxercalciferol  4 mcg Intravenous Q T,Th,Sa-HD  . ezetimibe  10 mg Oral Daily  . feeding supplement (PRO-STAT SUGAR FREE 64)  30 mL Oral BID  . fenofibrate  160 mg Oral Daily  . finasteride  5 mg Oral Daily  . glimepiride  1 mg Oral Q breakfast  . insulin aspart  0-5 Units Subcutaneous QHS  . insulin aspart  0-9 Units Subcutaneous TID WC  . ipratropium  2 spray Each Nare Daily  . multivitamin  1 tablet Oral QHS  . Warfarin - Pharmacist Dosing Inpatient   Does not apply q1800   Dialysis Orders:  TTS [facility] HD time to 4 hours EDW 85.5kg will be lower at discharge  2/2.25 bath  Hep none  AVF RUA (declot/PTA by Dr. Posey Pronto at [facility] 01/28/17 as op (initally placed 01/2014 ) - Hectoral 75mcg IV q HD - Venofer 50mg  IV weekly  Assessment/Plan:  1. R subcortical vs brainstem stroke. Initially L sided weakness/slurred speech - Head CT neg. ECG A flutter. Echo - EF 70. Gr 1DD. No SOE. Carotid dopplers neg. Significant flow limiting intracranial stenoses by CT angio.  Anticoagulation recommended b/o AFF. Warfarin w/plavix bridge PLUS ASA d/t diffuse atherosclerosis  .  2. Gross Hematuria/Urinary retention -  hematuria new this admission. Felt 2/2 enlarged prostate + foley trauma. Foley initially placed to keep pressure on prostate fossa.(Dr. Tresa [facility] saw 5/19) Foley out now. Required I/O cath yesterday for 450 cc urine 3. ESRD - HD started 01/14/17  (Only had HD 5/8, 5/10, 5/15 ((cannulation issues)) w/5/15 tmt of 2'53" longest PTA) Will be up to 4 hour treatment time as of 5/26 4. Anemia - Hgb 9.5  Aranesp 60 mcg IV dosed 5/22. Adding weekly ferrlecit  5. MBD- Continue VDRA/Ca acetate binder.   6. H/o A. Fib/ Pacemaker- Warfarin initiated - dosing per pharmacy. INR supra therapeutic today. Holding 7. Orthostatic hypotension/syncope No recurrence. On carvedilol 3.125mg  per cardiology recommendations.Need to avoid hypotension with HD. Neuro is OK with midodrine if needed. BP goal 120-150 per Neuro (with significant intracranial disease is at risk for further cerebral ischemia if hypotension) 8. Nutrition - Renal diet/ prostat for low albumin  9. Disposition - Rehab  [name], MD [place] Pager 02/08/2017, 10:44 AM

## 2017-02-08 NOTE — Progress Notes (Signed)
CRITICAL VALUE ALERT  Critical Value: INR-4.49   Date & Time Notied: 02/08/17 @ 0619hrs   Provider Notified: Dr. Alain Marion   Orders Received/Actions taken: Hold coumadin now and check INR tomorrow(02/08/17)

## 2017-02-08 NOTE — Progress Notes (Signed)
Occupational Therapy Session Note  Patient Details  Name: James David MRN: [id number] Date of Birth: Mar 01, 1936  Today's Date: 02/08/2017 OT Individual Time: 2595-6387 OT Individual Time Calculation (min): 55 min    Short Term Goals: Week 1:  OT Short Term Goal 1 (Week 1): Pt will maintain standing balance with close supervision while obtaining clothing in prep for dressing  OT Short Term Goal 2 (Week 1): Pt will demonstrate intellecual awareness of dec attention to the left in functional tasks OT Short Term Goal 3 (Week 1): Pt will don LB clothing sit to stand with supervision  OT Short Term Goal 4 (Week 1): Pt will perform 3/3 grooming tasks in standing with supervision   Skilled Therapeutic Interventions/Progress Updates:    1:1. No pain reported. Focus of session on sit to stand, LUE use, functional mobility and pursed lip breathing in context of ADLs. Pt ambulates throughout session with MIN A for balance, VC for foot clearance, stride length, and RW management. Pt transfers onto TTB with VC for sequencing. Pt has incontinent bowl movement on TTB with increased time to clean up. Pt bathes 10/10 body parts with supervision crossing LE to wash feet. Pt dresses seated EOB with RW at sit to stand level with MINA-S for standing balance. OT provides [place] shoe horn to don shoes with VC for technique. Pt reports SOB throughout session BP 134/59 and O2 93-99% throughout session with VC for pursed lip breathing. Pt stands at sink with MIN A to wash face, brush teeth and comb hair. Exited session with pt seated in recliner with call light in reach and all needs met.  Therapy Documentation Precautions:  Precautions Precautions: Fall Restrictions Weight Bearing Restrictions: No  See Function Navigator for Current Functional Status.   Therapy/Group: Individual Therapy  Tonny Branch 02/08/2017, 9:55 AM

## 2017-02-08 NOTE — Progress Notes (Signed)
Physical Therapy Session Note  Patient Details  Name: James David MRN: [id number] Date of Birth: 09/22/1935  Today's Date: 02/08/2017 PT Individual Time: 3419-3790 PT Individual Time Calculation (min): 74 min   Short Term Goals: Week 1:  PT Short Term Goal 1 (Week 1): STG=LTG due to estimated length of stay   Skilled Therapeutic Interventions/Progress Updates:   Pt received sitting recliner WC and agreeable to PT. Ambulatory transfer to [facility] with RW and close supervision assist from PT. Min cues for AD management in turn to [facility]. PT transported pt to rehab gym with total assist for time management. Gait with RW x 112f and supervision assist. Additional gait training without AD x 1536fand min assist. Min cues for improved step length and height to prevent foot drag L>R.  PT instructed pt in stair negotiation x 12 steps with min assist and BUE support. Min cues for awareness of the L side rail.    Nustep reciprocal movement training x 10 minutes level 4>6. Min cues from PT for improved ROM and symmetrical cadence.   Patient demonstrates increased fall risk as noted by score of  35 /56 on Berg Balance Scale.  (<36= high risk for falls, close to 100%; 37-45 significant >80%; 46-51 moderate >50%; 52-55 lower >25%)  PT instructed pt in 1063m average of 3 trials: 0.60m/21m <0.8 m/s indicates increased fall risk) PT provided min assist throughout for safety Pt also instructed in 5xSTS 26 sec. ( >15 sec indicates increased fall risk) Min assist from PT for safety as well as cues for full ROM when coming to standing position.   PYT instructed pt in variable gait training side stepping 20ft29faterally with UE support forward/backward gait with min assist and min cues for improved step length and posture 3 x 20 ft.   Patient returned too room and [facility] reach and all needs met.        Therapy Documentation Precautions:  Precautions Precautions:  Fall Restrictions Weight Bearing Restrictions: No General:   Vital Signs: Oxygen Therapy SpO2: 94 % O2 Device: Not Delivered O2 Flow Rate (L/min): 2 L/min   Pain: 0/10   Balance: Balance Balance Assessed: Yes Standardized Balance Assessment Standardized Balance Assessment: Berg Balance Test Berg Balance Test Sit to Stand: Able to stand  independently using hands Standing Unsupported: Able to stand safely 2 minutes Sitting with Back Unsupported but Feet Supported on Floor or Stool: Able to sit safely and securely 2 minutes Stand to Sit: Controls descent by using hands Transfers: Able to transfer safely, definite need of hands Standing Unsupported with Eyes Closed: Able to stand 10 seconds with supervision Standing Ubsupported with Feet Together: Able to place feet together independently and stand for 1 minute with supervision From Standing, Reach Forward with Outstretched Arm: Can reach forward >12 cm safely (5") From Standing Position, Pick up Object from Floor: Unable to pick up shoe, but reaches 2-5 cm (1-2") from shoe and balances independently From Standing Position, Turn to Look Behind Over each Shoulder: Turn sideways only but maintains balance Turn 360 Degrees: Needs close supervision or verbal cueing Standing Unsupported, Alternately Place Feet on Step/Stool: Able to complete >2 steps/needs minimal assist Standing Unsupported, One Foot in Front: Able to take small step independently and hold 30 seconds Standing on One Leg: Tries to lift leg/unable to hold 3 seconds but remains standing independently Total Score: 35     See Function Navigator for Current Functional Status.  Therapy/Group: Individual Therapy  Lorie Phenix 02/08/2017, 3:20 PM

## 2017-02-08 NOTE — Evaluation (Signed)
Speech Language Pathology Assessment and Plan  Patient Details  Name: James David MRN: [id number] Date of Birth: 1936-05-27  SLP Diagnosis: Cognitive Impairments  Rehab Potential: Good ELOS: 5-7 days    Today's Date: 02/08/2017 SLP Individual Time: 0623-7628 SLP Individual Time Calculation (min): 60 min   Problem List:  Patient Active Problem List   Diagnosis Date Noted  . Gait disturbance, post-stroke   . Subcortical infarction () 02/06/2017  . Intracranial vascular stenosis 02/04/2017  . Syncope   . Orthostatic hypotension   . Stroke ([place]) 02/01/2017  . TIA (transient ischemic attack) 01/29/2017  . Hypertension 01/29/2017  . CKD (chronic kidney disease) stage V requiring chronic dialysis ([place]) 01/29/2017  . DDD (degenerative disc disease) 01/29/2017  . HLD (hyperlipidemia) 01/29/2017  . Second degree heart block 01/29/2017  . Atrial flutter ([place]) 01/29/2017  . ESRD (end stage renal disease) on dialysis ([place])   . Melanoma of skin ([place]) 03/13/2016  . Atrial flutter ([place]) 06/26/2015  . Pacemaker 10/13/2014  . End stage renal disease ([place]) 07/15/2014  . Chronic diastolic heart failure ([place]) 03/24/2014  . Second-degree heart block 12/13/2013  . Chronic kidney disease, stage IV (severe) ([place]) 12/13/2013  . Essential hypertension 12/13/2013  . Diabetes mellitus due to underlying condition with diabetic nephropathy ([place]) 12/13/2013  . Hyperlipidemia 12/13/2013   Past Medical History:  Past Medical History:  Diagnosis Date  . Atrial fibrillation ([place])   . Chronic kidney disease, stage IV (severe) (HCC)    baseline creatinine 2-3  . DDD (degenerative disc disease)   . Diabetes mellitus without complication ([place])   . ESRD (end stage renal disease) on dialysis ([place])   . Hyperlipidemia   . Hypertension   . Melanoma ([place]) 2010   removed at [facility]  . Second degree heart block    a. s/p STJ dual chamber PPM - Dr [name]   Past Surgical History:  Past Surgical History:   Procedure Laterality Date  . AV FISTULA PLACEMENT Right 05/31/2014   Procedure: Right Arm Brachiocephalic ARTERIOVENOUS FISTULA CREATION  ;  Surgeon: Conrad [place], MD;  Location: [place];  Service: Vascular;  Laterality: Right;  . CATARACT EXTRACTION W/ INTRAOCULAR LENS  IMPLANT, BILATERAL    . COLONOSCOPY    . EXCISION MELANOMA WITH SENTINEL LYMPH NODE BIOPSY Right 02/08/2016   Procedure: WIDE EXCISION RIGHT SHOULDER MELANOMA WITH RIGHT SENTINEL LYMPH NODE BIOPSY;  Surgeon: [name], MD;  Location: [place];  Service: General;  Laterality: Right;  . LAMINECTOMY    . PERMANENT PACEMAKER INSERTION N/A 01/20/2014   STJ Assurity dual chamber pacemaker implanted by Dr [name] for 2nd degree AV block    Assessment / Plan / Recommendation Clinical Impression [name] a 81 y.o.right handed malewith history of diabetes mellitus, atrial fibrillation/second-degree heart block with pacemaker maintained on aspirin, end-stage renal disease with dialysis recently initiated, hypertension. Per chart review patient lives with spouse independent prior to admission. One level townhome. Presented 01/29/2017 with dizziness, facial droop, slurred speech and left-sided weakness. Cranial CT scan showed evidence of chronic small vessel disease but no acute or evolving abnormalities. Patient did not receive TPA. Echocardiogram With ejection fraction of 31% grade 1 diastolic dysfunction. Carotid Doppler but no ICA stenosis. Neurology consulted suspect acute ischemic infarct and currently maintained on Plavix for CVA prophylaxisLater changed to Coumadin in light of history of atrial fibrillation with follow-up per cardiology services. Patient continues on dialysis as directed per renal services. Physical and occupational therapy evaluations completed with  recommendations of physical medicine rehabilitation consult. Patient was admitted for a comprehensive rehab program. OT evaluation complete with recommendation for  SLE.  Cognitive-linguistic evaluation complete.  Patient administered the [facility] Cognitive Assessment, 7.1 and demonstrates skills consistent with a score 25/30 with 26 or greater being considered to be [facility].  Given that prior to admission patient reports he was driving and managing his own finances and medications brief skilled SLP services are recommended and patient in agreement in order to maximize his functional independence prior to discharge. Anticipate patient amy require assist with high level tasks at home and follow up SLP services.    Skilled Therapeutic Interventions          Skilled treatment initiated with focus on addressing cognition goals. SLP facilitated session by providing a handout and discussing memory compensatory strategies for home management.  Patient stated that he does not write notes, but does use a pill box that he keeps in the cabinet.  Continue with current plan of care.    SLP Assessment  Patient will need skilled [place] [facility] during CIR admission    Recommendations  Oral Care Recommendations: Oral care BID Patient destination: Home Follow up Recommendations: 24 hour supervision/assistance Equipment Recommended: None recommended by SLP    SLP Frequency 3 to 5 out of 7 days   SLP Duration  SLP Intensity  SLP Treatment/Interventions 5-7 days  Minumum of 1-2 x/day, 30 to 90 minutes  Cognitive remediation/compensation;Cueing hierarchy;Functional tasks;Medication managment;Internal/external aids;Patient/family education    Pain Pain Assessment Pain Assessment: No/denies pain  Prior Functioning Cognitive/Linguistic Baseline: Within functional limits  Function:   Cognition Comprehension Comprehension assist level: Follows basic conversation/direction with extra time/assistive device  Expression   Expression assist level: Expresses basic needs/ideas: With no assist  Social Interaction Social Interaction assist level: Interacts  appropriately 90% of the time - Needs monitoring or encouragement for participation or interaction.  Problem Solving Problem solving assist level: Solves basic 90% of the time/requires cueing < 10% of the time  Memory Memory assist level: Recognizes or recalls 90% of the time/requires cueing < 10% of the time   Short Term Goals: Week 1: SLP Short Term Goal 1 (Week 1): short term goals=long term goals  Refer to Care Plan for Long Term Goals  Recommendations for other services: None   Discharge Criteria: Patient will be discharged from SLP if patient refuses treatment 3 consecutive times without medical reason, if treatment goals not met, if there is a change in medical status, if patient makes no progress towards goals or if patient is discharged from hospital.  The above assessment, treatment plan, treatment alternatives and goals were discussed and mutually agreed upon: by patient  Gunnar Fusi, M.A., CCC-SLP [phone]  [place] 02/08/2017, 4:50 PM

## 2017-02-09 DIAGNOSIS — I10 Essential (primary) hypertension: Secondary | ICD-10-CM

## 2017-02-09 LAB — PROTIME-INR
INR: 1.98
Prothrombin Time: 22.8 seconds — ABNORMAL HIGH (ref 11.4–15.2)

## 2017-02-09 LAB — GLUCOSE, CAPILLARY
Glucose-Capillary: 102 mg/dL — ABNORMAL HIGH (ref 65–99)
Glucose-Capillary: 256 mg/dL — ABNORMAL HIGH (ref 65–99)
Glucose-Capillary: 126 mg/dL — ABNORMAL HIGH (ref 65–99)
Glucose-Capillary: 169 mg/dL — ABNORMAL HIGH (ref 65–99)

## 2017-02-09 MED ORDER — WARFARIN SODIUM 2.5 MG PO TABS
2.5000 mg | ORAL_TABLET | Freq: Once | ORAL | Status: AC
  Administered 2017-02-09: 2.5 mg via ORAL
  Filled 2017-02-09: qty 1

## 2017-02-09 MED ORDER — ROPINIROLE HCL 0.5 MG PO TABS
0.5000 mg | ORAL_TABLET | Freq: Every day | ORAL | Status: DC
  Administered 2017-02-09 – 2017-02-14 (×6): 0.5 mg via ORAL
  Filled 2017-02-09 (×7): qty 1

## 2017-02-09 NOTE — Progress Notes (Signed)
ANTICOAGULATION CONSULT NOTE - Follow Up Consult  Pharmacy Consult for coumadin Indication: atrial fibrillation in setting of acute stroke/TIA    Allergies  Allergen Reactions  . Amlodipine Besylate Swelling    angioedema  . Lisinopril Other (See Comments)    Renal insufficiency  . Statins Other (See Comments)    Muscle aches    Patient Measurements: Height: 5\' 10"  (177.8 cm) Weight: 172 lb 2.9 oz (78.1 kg) IBW/kg (Calculated) : 73 Heparin Dosing Weight:   Vital Signs: Temp: 98.1 F (36.7 C) (05/27 0431) Temp Source: Oral (05/27 0431) BP: 137/90 (05/27 0431) Pulse Rate: 79 (05/27 0431)  Labs:  Recent Labs  02/07/17 0654 02/08/17 0438 02/09/17 0537  LABPROT 26.9* 42.6* 22.8*  INR 2.44 4.49* 1.98    Estimated Creatinine Clearance: 9.7 mL/min (A) (by C-G formula based on SCr of 6.47 mg/dL (H)).   Medications:  Scheduled:  . aspirin EC  81 mg Oral Daily  . calcium acetate  1,334 mg Oral TID WC  . carvedilol  3.125 mg Oral BID WC  . [START ON 02/11/2017] darbepoetin (ARANESP) injection - DIALYSIS  60 mcg Intravenous Q Tue-HD  . doxercalciferol  4 mcg Intravenous Q T,Th,Sa-HD  . ezetimibe  10 mg Oral Daily  . feeding supplement (PRO-STAT SUGAR FREE 64)  30 mL Oral BID  . fenofibrate  160 mg Oral Daily  . finasteride  5 mg Oral Daily  . glimepiride  1 mg Oral Q breakfast  . insulin aspart  0-5 Units Subcutaneous QHS  . insulin aspart  0-9 Units Subcutaneous TID WC  . ipratropium  2 spray Each Nare Daily  . multivitamin  1 tablet Oral QHS  . rOPINIRole  0.5 mg Oral QHS  . Warfarin - Pharmacist Dosing Inpatient   Does not apply q1800   Infusions:  . ferric gluconate (FERRLECIT/NULECIT) IV      Assessment: 21 yoM with atrial fibrillation in setting of acute stroke/TIA on warfarin. INR labile and appears to be very sensitive. INR yesterday 4.49 now down to 1.98 after holding x1 - per MD ok to resume. No S/Sx bleeding documented.  Goal of Therapy:  INR  2-3 Monitor platelets by anticoagulation protocol: Yes   Plan:  -Warfarin 2.5mg  PO x1 tonight -Daily INR -Watch S/Sx bleeding closely  Arrie Senate, PharmD PGY-1 Pharmacy Resident Pager: [phone] 02/09/2017

## 2017-02-09 NOTE — Progress Notes (Signed)
[facility] Progress Note   [facility] Rounding Note  Subjective:   HD yesterday weight 81.8->78.1 Net UF was 1.5 liters Well below prev EDW of 85.5 Says "just doesn't feel good" Restless legs Wearing O2 and still says feels like can't get a big breath    Objective Vitals:   02/08/17 2000 02/08/17 2053 02/08/17 2146 02/09/17 0431  BP: 129/64 (!) 146/70 (!) 142/57 137/90  Pulse: 99 90 98 79  Resp:  16 18 18   Temp:  98.2 F (36.8 C) 98.1 F (36.7 C) 98.1 F (36.7 C)  TempSrc:  Oral Oral Oral  SpO2:  96%  96%  Weight:  79.1 kg (174 lb 6.1 oz) 78.1 kg (172 lb 2.9 oz)   Height:       Physical Exam BP 137/90 (BP Location: Left Arm)   Pulse 79   Temp 98.1 F (36.7 C) (Oral)   Resp 18   Ht 5\' 10"  (1.778 m)   Wt 78.1 kg (172 lb 2.9 oz)   SpO2 96%   BMI 24.71 kg/m  Awake, alert Sitting up in the chair Lungs clear Pacer left chest Still tachy around 100 S1S2 No S3 or murmur No rub No edema of LE's  R AVF + bruit.  Legs fidgeting during exam   Recent Labs Lab 02/03/17 0227 02/04/17 0424 02/06/17 0800  NA 137 136 134*  K 3.7 4.0 4.2  CL 99* 101 100*  CO2 27 24 23   GLUCOSE 154* 185* 192*  BUN 58* 83* 68*  CREATININE 6.12* 7.08* 6.47*  CALCIUM 8.5* 8.2* 8.5*  PHOS  --  5.3* 4.9*    Recent Labs Lab 02/04/17 0424 02/06/17 0800  ALBUMIN 2.4* 2.6*    Recent Labs Lab 02/03/17 0227 02/04/17 0424 02/05/17 0230 02/06/17 0800  WBC 12.3* 10.4 10.8* 9.9  HGB 10.0* 9.4* 9.5* 9.5*  HCT 30.9* 29.5* 29.7* 29.0*  MCV 92.2 92.8 92.2 92.4  PLT 173 184 184 185    Recent Labs Lab 02/07/17 2048 02/08/17 0645 02/08/17 1208 02/08/17 2128 02/09/17 0619  GLUCAP 222* 120* 267* 78 102*    Lab Results  Component Value Date   INR 1.98 02/09/2017   INR 4.49 (HH) 02/08/2017   INR 2.44 02/07/2017    Ct Angio Head W Or Wo Contrast  Result Date: 02/04/2017 CLINICAL DATA:  Syncope yesterday after going to the bathroom. Brief left arm weakness. EXAM:  CT ANGIOGRAPHY HEAD AND NECK TECHNIQUE: Multidetector CT imaging of the head and neck was performed using the standard protocol during bolus administration of intravenous contrast. Multiplanar CT image reconstructions and MIPs were obtained to evaluate the vascular anatomy. Carotid stenosis measurements (when applicable) are obtained utilizing NASCET criteria, using the distal internal carotid diameter as the denominator. CONTRAST:  50 cc Isovue 370 intravenous COMPARISON:  Head CT from 2 days prior FINDINGS: CT HEAD FINDINGS Brain: No evidence of acute infarction, hemorrhage, hydrocephalus, extra-axial collection or mass lesion/mass effect. Chronic small vessel ischemia in the cerebral white matter with remote lacunar infarct of the left caudate head. Vascular: See below Skull: No acute or aggressive finding Sinuses/orbits: Bilateral cataract resection.  No acute finding. Review of the MIP images confirms the above findings CTA NECK FINDINGS Aortic arch: Atherosclerotic plaque of the aortic arch. Two vessel branching. No acute finding Right carotid system: Mild atheromatous calcified plaque at the common carotid bifurcation. Mild atheromatous wall thickening at the ICA bulb. No stenosis, ulceration, or dissection. Left carotid system: Moderate mixed density atherosclerotic plaque  the common carotid bifurcation and ICA bulb with no flow limiting stenosis, ulceration, or dissection. Vertebral arteries: No proximal subclavian stenosis. There is severe atheromatous narrowing of the right vertebral artery origin. Moderate narrowing at the left vertebral artery origin. Intermittent likely atheromatous narrowings of the right V2 and V3 segment. The right vertebral artery is patent to the dura. Better patency of the slightly dominant left vertebral artery. Skeleton: No acute or aggressive finding Other neck: 14 mm dense/hypervascular nodule in the right tracheoesophageal groove with probable polar vessel. Bilateral thyroid  nodules, largest in the right upper pole measuring 25 mm. Probable cervicothoracic diffuse idiopathic skeletal hyperostosis. Multilevel ankylosis. Simple suboccipital lipoma. Upper chest: Negative Review of the MIP images confirms the above findings CTA HEAD FINDINGS Anterior circulation: Atherosclerosis of both carotid siphons with at least moderate right right para clinoid segment stenosis. Moderate narrowing of the left supraclinoid ICA. Hypoplastic right A1 segment with superimposed proximal narrowing. No major branch occlusion is noted. Notable moderate narrowings of the left A2 segment. There is diffuse atheromatous irregularity of bilateral MCA branches. Posterior circulation: Faint intermittently seen flow in the right vertebral artery to the level of the basilar. A right PICA is not seen but there is a sizable right AICA that is filling. The basilar is small in the setting of left fetal type PCA. There is superimposed moderate to advanced narrowing at the midportion. Extensive atheromatous irregularity of bilateral PCA branches. Venous sinuses: Patent Anatomic variants: None significant Delayed phase: No abnormal intracranial enhancement Review of the MIP images confirms the above findings IMPRESSION: 1. Severely depressed flow in the right V4 segment likely due to stenosis at the dura. Small basilar in the setting of fetal type left PCA, with superimposed moderate to advanced mid basilar stenosis. 2. Bilateral intracranial ICA stenosis that is moderate to advanced. 3. Advanced diffuse atherosclerotic irregularity of medium size intracranial vessels. 4. Severe right and moderate left vertebral origin stenosis. 5. No flow limiting stenosis in the atherosclerotic cervical carotid circulation. 6. 14 mm mass in the right tracheoesophageal groove with features of parathyroid adenoma. Correlate with this calcium labs. 7. Bilateral thyroid nodules measuring up to 25 mm on the right. Electronically Signed   By:  Monte Fantasia M.D.   On: 02/04/2017 10:26   Ct Angio Neck W Or Wo Contrast  Result Date: 02/04/2017 CLINICAL DATA:  Syncope yesterday after going to the bathroom. Brief left arm weakness. EXAM: CT ANGIOGRAPHY HEAD AND NECK TECHNIQUE: Multidetector CT imaging of the head and neck was performed using the standard protocol during bolus administration of intravenous contrast. Multiplanar CT image reconstructions and MIPs were obtained to evaluate the vascular anatomy. Carotid stenosis measurements (when applicable) are obtained utilizing NASCET criteria, using the distal internal carotid diameter as the denominator. CONTRAST:  50 cc Isovue 370 intravenous COMPARISON:  Head CT from 2 days prior FINDINGS: CT HEAD FINDINGS Brain: No evidence of acute infarction, hemorrhage, hydrocephalus, extra-axial collection or mass lesion/mass effect. Chronic small vessel ischemia in the cerebral white matter with remote lacunar infarct of the left caudate head. Vascular: See below Skull: No acute or aggressive finding Sinuses/orbits: Bilateral cataract resection.  No acute finding. Review of the MIP images confirms the above findings CTA NECK FINDINGS Aortic arch: Atherosclerotic plaque of the aortic arch. Two vessel branching. No acute finding Right carotid system: Mild atheromatous calcified plaque at the common carotid bifurcation. Mild atheromatous wall thickening at the ICA bulb. No stenosis, ulceration, or dissection. Left carotid system: Moderate mixed density  atherosclerotic plaque the common carotid bifurcation and ICA bulb with no flow limiting stenosis, ulceration, or dissection. Vertebral arteries: No proximal subclavian stenosis. There is severe atheromatous narrowing of the right vertebral artery origin. Moderate narrowing at the left vertebral artery origin. Intermittent likely atheromatous narrowings of the right V2 and V3 segment. The right vertebral artery is patent to the dura. Better patency of the slightly  dominant left vertebral artery. Skeleton: No acute or aggressive finding Other neck: 14 mm dense/hypervascular nodule in the right tracheoesophageal groove with probable polar vessel. Bilateral thyroid nodules, largest in the right upper pole measuring 25 mm. Probable cervicothoracic diffuse idiopathic skeletal hyperostosis. Multilevel ankylosis. Simple suboccipital lipoma. Upper chest: Negative Review of the MIP images confirms the above findings CTA HEAD FINDINGS Anterior circulation: Atherosclerosis of both carotid siphons with at least moderate right right para clinoid segment stenosis. Moderate narrowing of the left supraclinoid ICA. Hypoplastic right A1 segment with superimposed proximal narrowing. No major branch occlusion is noted. Notable moderate narrowings of the left A2 segment. There is diffuse atheromatous irregularity of bilateral MCA branches. Posterior circulation: Faint intermittently seen flow in the right vertebral artery to the level of the basilar. A right PICA is not seen but there is a sizable right AICA that is filling. The basilar is small in the setting of left fetal type PCA. There is superimposed moderate to advanced narrowing at the midportion. Extensive atheromatous irregularity of bilateral PCA branches. Venous sinuses: Patent Anatomic variants: None significant Delayed phase: No abnormal intracranial enhancement Review of the MIP images confirms the above findings IMPRESSION: 1. Severely depressed flow in the right V4 segment likely due to stenosis at the dura. Small basilar in the setting of fetal type left PCA, with superimposed moderate to advanced mid basilar stenosis. 2. Bilateral intracranial ICA stenosis that is moderate to advanced. 3. Advanced diffuse atherosclerotic irregularity of medium size intracranial vessels. 4. Severe right and moderate left vertebral origin stenosis. 5. No flow limiting stenosis in the atherosclerotic cervical carotid circulation. 6. 14 mm mass in  the right tracheoesophageal groove with features of parathyroid adenoma. Correlate with this calcium labs. 7. Bilateral thyroid nodules measuring up to 25 mm on the right. Electronically Signed   By: Monte Fantasia M.D.   On: 02/04/2017 10:26   Medications: . ferric gluconate (FERRLECIT/NULECIT) IV     . aspirin EC  81 mg Oral Daily  . calcium acetate  1,334 mg Oral TID WC  . carvedilol  3.125 mg Oral BID WC  . [START ON 02/11/2017] darbepoetin (ARANESP) injection - DIALYSIS  60 mcg Intravenous Q Tue-HD  . doxercalciferol  4 mcg Intravenous Q T,Th,Sa-HD  . ezetimibe  10 mg Oral Daily  . feeding supplement (PRO-STAT SUGAR FREE 64)  30 mL Oral BID  . fenofibrate  160 mg Oral Daily  . finasteride  5 mg Oral Daily  . glimepiride  1 mg Oral Q breakfast  . insulin aspart  0-5 Units Subcutaneous QHS  . insulin aspart  0-9 Units Subcutaneous TID WC  . ipratropium  2 spray Each Nare Daily  . multivitamin  1 tablet Oral QHS  . Warfarin - Pharmacist Dosing Inpatient   Does not apply q1800   Dialysis Orders:  TTS [facility] HD time to 4 hours EDW 85.5kg will be lower at discharge  2/2.25 [place]  Hep none  AVF RUA (declot/PTA by Dr. Posey Pronto at [facility] 01/28/17 as op (initally placed 01/2014 ) - Hectoral 57mcg IV q HD - Venofer 50mg   IV weekly  Assessment/Plan:  1. R subcortical vs brainstem stroke. Initially L sided weakness/slurred speech - Resolved. Head CT neg. ECG A flutter. Echo - EF 70. Gr 1DD. No SOE. Carotid dopplers neg. Significant flow limiting intracranial stenoses by CT angio.  Anticoagulation recommended b/o AFF. Warfarin PLUS ASA d/t diffuse atherosclerosis 2. Gross Hematuria/Urinary retention - new this admission. Felt 2/2 enlarged prostate + foley trauma. Foley initially placed to keep pressure on prostate fossa.(Dr. Tresa Moore Urology saw 5/19) Foley out now. Required I/O cath 5./25 for 450 cc urine 3. ESRD - TTS. Have worked up to 4 hour TMT times. Next HD Tuesday 4. HD access - R AVF.  Required declot[facility]PTA at [facility] 01/28/17 PTA. No  issues here. Up to 15 ga needles.  5. Anemia - Last Hgb 9.5  Aranesp 60 mcg IV dosed 5/22. Added weekly ferrlecit  6. MBD- Hectorol/Ca acetate   7. H/o A. Fib/ Pacemaker- Warfarin dosing per pharmacy.  8. Orthostatic hypotension/syncope.  No recurrence. On carvedilol 3.125mg  per cardiology recommendations.Need to avoid hypotension with HD. Neuro is OK with midodrine if needed. BP goal 120-150 per Neuro (with significant intracranial disease is at risk for further cerebral ischemia if hypotension) 9. Nutrition - Renal diet/ prostat for low albumin 10. RLS - leg fidgeting keepong him awake at night. Try requip at hs  11. Disposition - Rehab  [name], MD [facility] [phone] Pager 02/09/2017, 9:17 AM

## 2017-02-09 NOTE — Progress Notes (Signed)
James David is a 81 y.o. male November 30, 1935 [id number]  Subjective: No new complaints. No new problems. Slept well. Feeling OK.  Objective: Vital signs in last 24 hours: Temp:  [97.4 F (36.3 C)-98.2 F (36.8 C)] 97.6 F (36.4 C) (05/27 1448) Pulse Rate:  [79-101] 101 (05/27 1448) Resp:  [16-18] 17 (05/27 1448) BP: (100-158)/(56-90) 125/66 (05/27 1448) SpO2:  [95 %-98 %] 98 % (05/27 1448) Weight:  [172 lb 2.9 oz (78.1 kg)-180 lb 5.4 oz (81.8 kg)] 172 lb 2.9 oz (78.1 kg) (05/26 2146) Weight change: 3 lb 12 oz (1.7 kg) Last BM Date: 02/08/17  Intake/Output from previous day: 05/26 0701 - 05/27 0700 In: 840 [P.O.:840] Out: 1600 [Urine:100] Last cbgs: CBG (last 3)   Recent Labs  02/08/17 2128 02/09/17 0619 02/09/17 1148  GLUCAP 78 102* 256*     Physical Exam General: No apparent distress   HEENT: not dry Lungs: Normal effort. Lungs clear to auscultation, no crackles or wheezes. Cardiovascular: Regular rate and rhythm, no edema Abdomen: S/NT/ND; BS(+) Musculoskeletal:  unchanged Neurological: No new neurological deficits Wounds: N/A    Skin: clear  Aging changes Mental state: Alert, oriented, cooperative    Lab Results: BMET    Component Value Date/Time   NA 134 (L) 02/06/2017 0800   NA 138 10/17/2016 1303   K 4.2 02/06/2017 0800   K 5.1 10/17/2016 1303   CL 100 (L) 02/06/2017 0800   CO2 23 02/06/2017 0800   CO2 18 (L) 10/17/2016 1303   GLUCOSE 192 (H) 02/06/2017 0800   GLUCOSE 197 (H) 10/17/2016 1303   BUN 68 (H) 02/06/2017 0800   BUN 71.0 (H) 10/17/2016 1303   CREATININE 6.47 (H) 02/06/2017 0800   CREATININE 5.9 (HH) 10/17/2016 1303   CALCIUM 8.5 (L) 02/06/2017 0800   CALCIUM 8.9 10/17/2016 1303   GFRNONAA 7 (L) 02/06/2017 0800   GFRAA 8 (L) 02/06/2017 0800   CBC    Component Value Date/Time   WBC 9.9 02/06/2017 0800   RBC 3.14 (L) 02/06/2017 0800   HGB 9.5 (L) 02/06/2017 0800   HGB 12.0 (L) 10/17/2016 1303   HCT 29.0 (L) 02/06/2017 0800   HCT 34.4 (L) 10/17/2016 1303   PLT 185 02/06/2017 0800   PLT 198 10/17/2016 1303   MCV 92.4 02/06/2017 0800   MCV 86.6 10/17/2016 1303   MCH 30.3 02/06/2017 0800   MCHC 32.8 02/06/2017 0800   RDW 14.1 02/06/2017 0800   RDW 13.3 10/17/2016 1303   LYMPHSABS 1.0 01/29/2017 1139   LYMPHSABS 1.0 10/17/2016 1303   MONOABS 0.7 01/29/2017 1139   MONOABS 0.5 10/17/2016 1303   EOSABS 0.1 01/29/2017 1139   EOSABS 0.2 10/17/2016 1303   BASOSABS 0.0 01/29/2017 1139   BASOSABS 0.0 10/17/2016 1303    Studies/Results: No results found.  Medications: I have reviewed the patient's current medications.  Assessment/Plan:   1. CVA - cont CIR. On Coumadin and BP meds 2. DVT proph -- on Coumadin 3. A fib - on Coumadin 4. ESRD - on HD 5. Anemia -- Aranesp 6. BPH - on Uroxatral 7. Dyslipidemia - on Zetia    Length of stay, days: 3  Walker Kehr , MD 02/09/2017, 3:10 PM

## 2017-02-10 ENCOUNTER — Inpatient Hospital Stay (HOSPITAL_COMMUNITY): Payer: Medicare Other | Admitting: Physical Therapy

## 2017-02-10 ENCOUNTER — Inpatient Hospital Stay (HOSPITAL_COMMUNITY): Payer: Medicare Other | Admitting: Occupational Therapy

## 2017-02-10 LAB — RENAL FUNCTION PANEL
ALBUMIN: 2.8 g/dL — AB (ref 3.5–5.0)
Anion gap: 14 (ref 5–15)
BUN: 72 mg/dL — AB (ref 6–20)
CALCIUM: 8.7 mg/dL — AB (ref 8.9–10.3)
CHLORIDE: 95 mmol/L — AB (ref 101–111)
CO2: 26 mmol/L (ref 22–32)
CREATININE: 6.66 mg/dL — AB (ref 0.61–1.24)
GFR, EST AFRICAN AMERICAN: 8 mL/min — AB (ref >60)
GFR, EST NON AFRICAN AMERICAN: 7 mL/min — AB (ref >60)
Glucose, Bld: 110 mg/dL — ABNORMAL HIGH (ref 65–99)
PHOSPHORUS: 6.4 mg/dL — AB (ref 2.5–4.6)
Potassium: 3.5 mmol/L (ref 3.5–5.1)
SODIUM: 135 mmol/L (ref 135–145)

## 2017-02-10 LAB — GLUCOSE, CAPILLARY
GLUCOSE-CAPILLARY: 104 mg/dL — AB (ref 65–99)
Glucose-Capillary: 231 mg/dL — ABNORMAL HIGH (ref 65–99)
Glucose-Capillary: 183 mg/dL — ABNORMAL HIGH (ref 65–99)
Glucose-Capillary: 153 mg/dL — ABNORMAL HIGH (ref 65–99)

## 2017-02-10 LAB — CBC
HCT: 29.8 % — ABNORMAL LOW (ref 39.0–52.0)
Hemoglobin: 9.7 g/dL — ABNORMAL LOW (ref 13.0–17.0)
MCH: 30.1 pg (ref 26.0–34.0)
MCHC: 32.6 g/dL (ref 30.0–36.0)
MCV: 92.5 fL (ref 78.0–100.0)
PLATELETS: 236 10*3/uL (ref 150–400)
RBC: 3.22 MIL/uL — AB (ref 4.22–5.81)
RDW: 14.5 % (ref 11.5–15.5)
WBC: 9.8 10*3/uL (ref 4.0–10.5)

## 2017-02-10 LAB — PROTIME-INR
INR: 4.89 — AB
INR: 4.72 — AB
PROTHROMBIN TIME: 47 s — AB (ref 11.4–15.2)
Prothrombin Time: 45.7 seconds — ABNORMAL HIGH (ref 11.4–15.2)

## 2017-02-10 MED ORDER — LIDOCAINE HCL (PF) 1 % IJ SOLN
2.0000 mL | Freq: Once | INTRAMUSCULAR | Status: DC
  Filled 2017-02-10: qty 2

## 2017-02-10 MED ORDER — WARFARIN - PHARMACIST DOSING INPATIENT: Freq: Every day | Status: DC

## 2017-02-10 NOTE — Progress Notes (Signed)
Physical Therapy Session Note  Patient Details  Name: James David MRN: [id number] Date of Birth: 01/17/1936  Today's Date: 02/10/2017 PT Individual Time: 0820-0900 PT Individual Time Calculation (min): 40 min   Short Term Goals: Week 1:  PT Short Term Goal 1 (Week 1): STG=LTG due to estimated length of stay   Skilled Therapeutic Interventions/Progress Updates: Pt presented in recliner completing breakfast and nsg giving meds. Pt agreeable to therapy. Pt supervision threading pants in sitting, min guard sit to stand and min guard pulling up pants.Upon initiation of ambulation pt with episode of urinary incontinence. Pt ambulated to BR provided minA for doffing pants/briefs and minA for LE clothing management for time. Pt ambulated to rehab gym with min guard and cues for increasing step length. Performed sit to/from stand from mat and chair x 5 with cues for hand placement with fair carryover. Performed obstacle course/cone weaving in hall with min guard, no LOB. Performed toe taps/taps to target with single UE support and min guard. Pt returned to room in same manner as prior with improved step length noted. Pt returned to recliner and remained in chair at end of session with call bell within reach and needs met.      Therapy Documentation Precautions:  Precautions Precautions: Fall Restrictions Weight Bearing Restrictions: No General: PT Amount of Missed Time (min): 20 Minutes PT Missed Treatment Reason: Nursing care;Other (Comment) (Pt just received breakfast) Vital Signs: Therapy Vitals Pulse Rate: (!) 106 Resp: 20 BP: (!) 147/64 Patient Position (if appropriate): Lying Oxygen Therapy SpO2: 100 % O2 Device: Not Delivered Pain: Pain Assessment Pain Assessment: No/denies pain Pain Score: 0-No pain  See Function Navigator for Current Functional Status.   Therapy/Group: Individual Therapy  Croy Drumwright  Eliga Arvie, PTA  02/10/2017, 12:16 PM

## 2017-02-10 NOTE — Progress Notes (Signed)
ANTICOAGULATION CONSULT NOTE - Follow Up Consult  Pharmacy Consult for coumadin Indication: atrial fibrillation in setting of acute stroke/TIA    Allergies  Allergen Reactions  . Amlodipine Besylate Swelling    angioedema  . Lisinopril Other (See Comments)    Renal insufficiency  . Statins Other (See Comments)    Muscle aches    Patient Measurements: Height: 5\' 10"  (177.8 cm) Weight: 172 lb 2.9 oz (78.1 kg) IBW/kg (Calculated) : 73 Heparin Dosing Weight:   Vital Signs: Temp: 98.3 F (36.8 C) (05/28 0300) Temp Source: Oral (05/28 0300) BP: 147/64 (05/28 0818) Pulse Rate: 106 (05/28 0818)  Labs:  Recent Labs  02/09/17 0537 02/10/17 0559 02/10/17 1033  HGB  --  9.7*  --   HCT  --  29.8*  --   PLT  --  236  --   LABPROT 22.8* 47.0* 45.7*  INR 1.98 4.89* 4.72*  CREATININE  --  6.66*  --     Estimated Creatinine Clearance: 9.4 mL/min (A) (by C-G formula based on SCr of 6.66 mg/dL (H)).   Medications:  Scheduled:  . aspirin EC  81 mg Oral Daily  . calcium acetate  1,334 mg Oral TID WC  . carvedilol  3.125 mg Oral BID WC  . [START ON 02/11/2017] darbepoetin (ARANESP) injection - DIALYSIS  60 mcg Intravenous Q Tue-HD  . doxercalciferol  4 mcg Intravenous Q T,Th,Sa-HD  . ezetimibe  10 mg Oral Daily  . feeding supplement (PRO-STAT SUGAR FREE 64)  30 mL Oral BID  . fenofibrate  160 mg Oral Daily  . finasteride  5 mg Oral Daily  . glimepiride  1 mg Oral Q breakfast  . insulin aspart  0-5 Units Subcutaneous QHS  . insulin aspart  0-9 Units Subcutaneous TID WC  . ipratropium  2 spray Each Nare Daily  . multivitamin  1 tablet Oral QHS  . rOPINIRole  0.5 mg Oral QHS  . Warfarin - Pharmacist Dosing Inpatient   Does not apply q1800   Infusions:  . ferric gluconate (FERRLECIT/NULECIT) IV      Assessment: 79 yoM with atrial fibrillation in setting of acute stroke/TIA on warfarin. INR labile and appears to be very sensitive. INR 4.89 today with confirmatory repeat INR  4.72 despite INR of 1.98 yesterday. MD aware, no overt S/Sx bleeding noted and Hgb stable.  Goal of Therapy:  INR 2-3 Monitor platelets by anticoagulation protocol: Yes   Plan:  -Hold warfarin for now -INR in am -Watch S/Sx bleeding closely  Arrie Senate, PharmD PGY-1 Pharmacy Resident Pager: [phone] 02/10/2017

## 2017-02-10 NOTE — Progress Notes (Signed)
Physical Therapy Note  Patient Details  Name: James David MRN: [id number] Date of Birth: 21-Jan-1936 Today's Date: 02/10/2017    Time: 1300-1411 71 minutes  1:1 No c/o pain. Gait with RW and supervision 160' x 2 with no LOB, cues for deep breathing when fatigued.  Gait without AD for balance training 100' with close supervision.  Obstacle negotiation, stepping over objects, ball bounce and ball toss all with gait without AD with min guard.  Gait training with SPC as this is what pt states he uses occasionally at home.  In controlled environment pt able to gait with supervision with SPC 150' x 2.  Gait up/down ramp and on uneven surfaces with SPC with close supervision.  Standing balance on foam with horseshoe toss and reaching all directions with min A.  Standing balance on rocker board with lateral and A/P wt shifts with min/mod A for balance.  [place] exercise program performed for strength and balance with 2# wts.  Standing kinetron for LE strength and balance 5 x 1 minute.  Pt fatigued at end of session but pleased with progress.   Teghan Philbin 02/10/2017, 2:11 PM

## 2017-02-10 NOTE — Progress Notes (Signed)
INR 4.72 NOTED BY [facility].

## 2017-02-10 NOTE — Progress Notes (Signed)
Assessment/Plan:  1. R subcortical vs brainstem stroke. Initially L sided weakness/slurred speech - Resolved. Head CT neg. ECG A flutter. Echo - EF 70. Gr 1DD. No SOE. Carotid dopplers neg. Significant flow limiting intracranial stenoses by CT angio.  Anticoagulation recommended b/o AFF. Warfarin PLUS ASA d/t diffuse atherosclerosis 2. Gross Hematuria/Urinary retention - new this admission. Felt 2/2 enlarged prostate + foley trauma. Foley initially placed to keep pressure on prostate fossa.(Dr. Tresa [facility] saw 5/19) Foley out now. Required I/O cath 5./25 for 450 cc urine 3. ESRD - TTS. Weak post treatment , no obvious volume excess. BP ok, Next HD Tuesday 4. HD access - R AVF. Required declot/PTA at [facility] 01/28/17 PTA. No  issues here. Up to 15 ga needles.  5. Anemia - Last Hgb 9.5  Aranesp 60 mcg IV dosed 5/22. Added weekly ferrlecit  6. MBD- Hectorol/Ca acetate   7. H/o A. Fib/ Pacemaker- Warfarin dosing per pharmacy.  8. Orthostatic hypotension/syncope.  No recurrence. On carvedilol 3.125mg  per cardiology recommendations.Need to avoid hypotension with HD. Neuro is OK with midodrine if needed. BP goal 120-150 per Neuro (with significant intracranial disease is at risk for further cerebral ischemia if hypotension   Subjective: Interval History: c/o weakness esp post HD. Also c/o SOB  Objective: Vital signs in last 24 hours: Temp:  [97.6 F (36.4 C)-98.3 F (36.8 C)] 98.3 F (36.8 C) (05/28 0300) Pulse Rate:  [91-106] 106 (05/28 0818) Resp:  [17-20] 20 (05/28 0818) BP: (125-147)/(49-66) 147/64 (05/28 0818) SpO2:  [94 %-100 %] 100 % (05/28 0818) Weight change:   Intake/Output from previous day: 05/27 0701 - 05/28 0700 In: 920 [P.O.:920] Out: 100 [Urine:100] Intake/Output this shift: Total I/O In: 240 [P.O.:240] Out: -   General appearance: alert and cooperative Resp: clear to auscultation bilaterally Chest wall: no tenderness Cardio: regular rate and rhythm, S1, S2 normal, no  murmur, 2/6 SEM LLSB Abd soft No CCE, RUE AVF ok  Lab Results:  Recent Labs  02/10/17 0559  WBC 9.8  HGB 9.7*  HCT 29.8*  PLT 236   BMET:  Recent Labs  02/10/17 0559  NA 135  K 3.5  CL 95*  CO2 26  GLUCOSE 110*  BUN 72*  CREATININE 6.66*  CALCIUM 8.7*   No results for input(s): PTH in the last 72 hours. Iron Studies: No results for input(s): IRON, TIBC, TRANSFERRIN, FERRITIN in the last 72 hours. Studies/Results: No results found.  Scheduled: . aspirin EC  81 mg Oral Daily  . calcium acetate  1,334 mg Oral TID WC  . carvedilol  3.125 mg Oral BID WC  . [START ON 02/11/2017] darbepoetin (ARANESP) injection - DIALYSIS  60 mcg Intravenous Q Tue-HD  . doxercalciferol  4 mcg Intravenous Q T,Th,Sa-HD  . ezetimibe  10 mg Oral Daily  . feeding supplement (PRO-STAT SUGAR FREE 64)  30 mL Oral BID  . fenofibrate  160 mg Oral Daily  . finasteride  5 mg Oral Daily  . glimepiride  1 mg Oral Q breakfast  . insulin aspart  0-5 Units Subcutaneous QHS  . insulin aspart  0-9 Units Subcutaneous TID WC  . ipratropium  2 spray Each Nare Daily  . multivitamin  1 tablet Oral QHS  . rOPINIRole  0.5 mg Oral QHS  . Warfarin - Pharmacist Dosing Inpatient   Does not apply q1800       LOS: 4 days   Emanuella Nickle C 02/10/2017,10:53 AM

## 2017-02-10 NOTE — Progress Notes (Signed)
Occupational Therapy Session Note  Patient Details  Name: James David MRN: [id number] Date of Birth: [birth year]   Today's Date: 02/10/2017 OT Individual Time: 1100-1200 OT Individual Time Calculation (min): 60 min    Short Term Goals: Week 1:  OT Short Term Goal 1 (Week 1): Pt will maintain standing balance with close supervision while obtaining clothing in prep for dressing  OT Short Term Goal 2 (Week 1): Pt will demonstrate intellecual awareness of dec attention to the left in functional tasks OT Short Term Goal 3 (Week 1): Pt will don LB clothing sit to stand with supervision  OT Short Term Goal 4 (Week 1): Pt will perform 3/3 grooming tasks in standing with supervision   Skilled Therapeutic Interventions/Progress Updates:    Pt seen for OT ADL bathing/ dressing session and family education regarding tub/shower transfers. Pt sitting up in recliner upon arrival, voicing increased fatigue but ready for a shower. He ambulated throughout room using RW with close supervision. He required VCs throughout for proper RW management and safety awareness. He bathed seated on tub bench with supervision, standing with use of grab bars to complete buttock hygiene/ pericare and cross ankle over knee to wash feet. He dressed seated on toilet, with increased time with supervision for standing portions. Grooming completed standing at sink with supervision. Following extended seated rest break, pt taken to ADL apartment in w/c for time and energy conservation. He completed simulated tub/shower transfer utilizing tub bench and also completed simulated shower stall transfer to shower chair. Following demonstration for technique, pt completed both transfers with steadying assist. Pt will cont to benefit from practice with transfers. Educated pt and wife regarding DME and bathing options and recommendations for seated bathing tasks.  He returned to room with assist of wife in w/c at end of session.  Educated  throughout session regarding role of OT, POC, continuum of care, DME, and d/c planning.   Therapy Documentation Precautions:  Precautions Precautions: Fall Restrictions Weight Bearing Restrictions: No Pain:   No/ denies pain ADL: ADL ADL Comments: see functional navigator  See Function Navigator for Current Functional Status.   Therapy/Group: Individual Therapy  [name], Dhruvan Gullion C 02/10/2017, 7:12 AM

## 2017-02-10 NOTE — Progress Notes (Signed)
Subjective/Complaints: Slept ok. No new issues  ROS: pt denies nausea, vomiting, diarrhea, cough, shortness of breath or chest pain  Objective: Vital Signs: Blood pressure (!) 147/64, pulse (!) 106, temperature 98.3 F (36.8 C), temperature source Oral, resp. rate 20, height 5' 10"  (1.778 m), weight 78.1 kg (172 lb 2.9 oz), SpO2 100 %. No results found. Results for orders placed or performed during the hospital encounter of 02/06/17 (from the past 72 hour(s))  Glucose, capillary     Status: Abnormal   Collection Time: 02/07/17 12:28 PM  Result Value Ref Range   Glucose-Capillary 248 (H) 65 - 99 mg/dL  Glucose, capillary     Status: Abnormal   Collection Time: 02/07/17  4:32 PM  Result Value Ref Range   Glucose-Capillary 290 (H) 65 - 99 mg/dL  Glucose, capillary     Status: Abnormal   Collection Time: 02/07/17  8:48 PM  Result Value Ref Range   Glucose-Capillary 222 (H) 65 - 99 mg/dL  Protime-INR     Status: Abnormal   Collection Time: 02/08/17  4:38 AM  Result Value Ref Range   Prothrombin Time 42.6 (H) 11.4 - 15.2 seconds   INR 4.49 (HH)     Comment: REPEATED TO VERIFY CRITICAL RESULT CALLED TO, READ BACK BY AND VERIFIED WITH: CRUZAT C RN AT 0615 ON 05.26.2018 BY COCHRANE S   Glucose, capillary     Status: Abnormal   Collection Time: 02/08/17  6:45 AM  Result Value Ref Range   Glucose-Capillary 120 (H) 65 - 99 mg/dL  Glucose, capillary     Status: Abnormal   Collection Time: 02/08/17 12:08 PM  Result Value Ref Range   Glucose-Capillary 267 (H) 65 - 99 mg/dL  Glucose, capillary     Status: None   Collection Time: 02/08/17  9:28 PM  Result Value Ref Range   Glucose-Capillary 78 65 - 99 mg/dL  Protime-INR     Status: Abnormal   Collection Time: 02/09/17  5:37 AM  Result Value Ref Range   Prothrombin Time 22.8 (H) 11.4 - 15.2 seconds   INR 1.98   Glucose, capillary     Status: Abnormal   Collection Time: 02/09/17  6:19 AM  Result Value Ref Range   Glucose-Capillary  102 (H) 65 - 99 mg/dL  Glucose, capillary     Status: Abnormal   Collection Time: 02/09/17 11:48 AM  Result Value Ref Range   Glucose-Capillary 256 (H) 65 - 99 mg/dL  Glucose, capillary     Status: Abnormal   Collection Time: 02/09/17  4:55 PM  Result Value Ref Range   Glucose-Capillary 126 (H) 65 - 99 mg/dL  Glucose, capillary     Status: Abnormal   Collection Time: 02/09/17  9:14 PM  Result Value Ref Range   Glucose-Capillary 169 (H) 65 - 99 mg/dL  Glucose, capillary     Status: Abnormal   Collection Time: 02/10/17  5:37 AM  Result Value Ref Range   Glucose-Capillary 104 (H) 65 - 99 mg/dL  Protime-INR     Status: Abnormal   Collection Time: 02/10/17  5:59 AM  Result Value Ref Range   Prothrombin Time 47.0 (H) 11.4 - 15.2 seconds   INR 4.89 (HH)     Comment: REPEATED TO VERIFY CRITICAL RESULT CALLED TO, READ BACK BY AND VERIFIED WITH: DENISE LLOYD,RN AT 0730 02/10/17 BY ZBEECH.   Renal function panel     Status: Abnormal   Collection Time: 02/10/17  5:59 AM  Result Value Ref  Range   Sodium 135 135 - 145 mmol/L   Potassium 3.5 3.5 - 5.1 mmol/L   Chloride 95 (L) 101 - 111 mmol/L   CO2 26 22 - 32 mmol/L   Glucose, Bld 110 (H) 65 - 99 mg/dL   BUN 72 (H) 6 - 20 mg/dL   Creatinine, Ser 6.66 (H) 0.61 - 1.24 mg/dL   Calcium 8.7 (L) 8.9 - 10.3 mg/dL   Phosphorus 6.4 (H) 2.5 - 4.6 mg/dL   Albumin 2.8 (L) 3.5 - 5.0 g/dL   GFR calc non Af Amer 7 (L) >60 mL/min   GFR calc Af Amer 8 (L) >60 mL/min    Comment: (NOTE) The eGFR has been calculated using the CKD EPI equation. This calculation has not been validated in all clinical situations. eGFR's persistently <60 mL/min signify possible Chronic Kidney Disease.    Anion gap 14 5 - 15  CBC     Status: Abnormal   Collection Time: 02/10/17  5:59 AM  Result Value Ref Range   WBC 9.8 4.0 - 10.5 K/uL   RBC 3.22 (L) 4.22 - 5.81 MIL/uL   Hemoglobin 9.7 (L) 13.0 - 17.0 g/dL   HCT 29.8 (L) 39.0 - 52.0 %   MCV 92.5 78.0 - 100.0 fL   MCH  30.1 26.0 - 34.0 pg   MCHC 32.6 30.0 - 36.0 g/dL   RDW 14.5 11.5 - 15.5 %   Platelets 236 150 - 400 K/uL     HEENT: normal Caridac: RRR CHest: CTA B Extremity:  Pulses positive and No Edema Skin:   Intact Neuro: Alert/Oriented, Normal Sensory and Abnormal Motor 4 to 4+/5, bilateral deltoid by stress, grip, hip flexor and extensor, ankle dorsiflexor-- Musc/Skel:  Other No pain with upper extremity or lower extremity range of motion Gen. no acute distress   Assessment/Plan: 1. Functional deficits secondary to Right subcortical infarct which require 3+ hours per day of interdisciplinary therapy in a comprehensive inpatient rehab setting. Physiatrist is providing close team supervision and 24 hour management of active medical problems listed below. Physiatrist and rehab team continue to assess barriers to discharge/monitor patient progress toward functional and medical goals. FIM:       Function - Toileting Toileting steps completed by patient: Adjust clothing prior to toileting, Performs perineal hygiene Toileting steps completed by helper: Adjust clothing after toileting Assist level: Touching or steadying assistance (Pt.75%)  Function - Air cabin crew transfer assistive device: Grab bar, Walker Assist level to toilet: Touching or steadying assistance (Pt > 75%) Assist level from toilet: Touching or steadying assistance (Pt > 75%)  Function - Chair/bed transfer Chair/bed transfer method: Stand pivot Chair/bed transfer assist level: Touching or steadying assistance (Pt > 75%) Chair/bed transfer assistive device: Armrests  Function - Locomotion: Wheelchair Will patient use wheelchair at discharge?: No Type: Manual Max wheelchair distance: 178f  Assist Level: Touching or steadying assistance (Pt > 75%) Assist Level: Touching or steadying assistance (Pt > 75%) Wheel 150 feet activity did not occur: Refused Turns around,maneuvers to table,bed, and toilet,negotiates  3% grade,maneuvers on rugs and over doorsills: No Function - Locomotion: Ambulation Assistive device: No device Max distance: 1592f Assist level: Touching or steadying assistance (Pt > 75%) Assist level: Touching or steadying assistance (Pt > 75%) Assist level: Touching or steadying assistance (Pt > 75%) Walk 150 feet activity did not occur: Safety/medical concerns Assist level: Touching or steadying assistance (Pt > 75%) Assist level: Touching or steadying assistance (Pt > 75%)  Function - Comprehension  Comprehension: Auditory Comprehension assist level: Follows basic conversation/direction with no assist  Function - Expression Expression: Verbal Expression assist level: Expresses basic needs/ideas: With no assist  Function - Social Interaction Social Interaction assist level: Interacts appropriately with others - No medications needed.  Function - Problem Solving Problem solving assist level: Solves basic problems with no assist  Function - Memory Memory assist level: Recognizes or recalls 90% of the time/requires cueing < 10% of the time Patient normally able to recall (first 3 days only): Current season, Location of own room, That he or she is in a hospital  Medical Problem List and Plan: 1. Gait disorder secondary to right subcortical infarct  -continue  PT, OT   2. DVT Prophylaxis/Anticoagulation: Coumadin. INR 4.89  -hold coumadin today  -F/U INR per pharmacy policy and re-dose potentially tomorrow depending upon level 3. Pain Management: Tylenol as needed 4. Mood: Provide emotional support 5. Neuropsych: This patient iscapable of making decisions on hisown behalf. 6. Skin/Wound Care: Routine skin checks 7. Fluids/Electrolytes/Nutrition: Routine I&O's with follow-up chemistry 8.Atrial fibrillation. Continue Coumadin therapy. Cardiac rate controlled. Follow-up cardiology services 9.ESRD. Continue hemodialysis as per renal services 10.Diabetes mellitus of  peripheral neuropathy. Hemoglobin A1c 7.0.SSI.Check blood sugars before meals and at bedtime. Patient on Amaryl 4 mg twice a day prior to admission.    -REASONABLE control 11.Acute on chronic anemia. Continue Aranesp 12.Hypertension. Coreg 3.125 mg twice a day 13.BPH. Uroxatral 10 mg daily, Proscar 5 mg daily. uroxatral held for urinary retention 14.Hyperlipidemia. Zetia  LOS (Days) 4 A FACE TO FACE EVALUATION WAS PERFORMED  SWARTZ,ZACHARY T 02/10/2017, 9:16 AM

## 2017-02-10 NOTE — Progress Notes (Signed)
INR 4.89 noted by Zyda B. Lab

## 2017-02-11 ENCOUNTER — Inpatient Hospital Stay (HOSPITAL_COMMUNITY): Payer: Medicare Other | Admitting: Physical Therapy

## 2017-02-11 ENCOUNTER — Inpatient Hospital Stay (HOSPITAL_COMMUNITY): Payer: Medicare Other | Admitting: Speech Pathology

## 2017-02-11 ENCOUNTER — Inpatient Hospital Stay (HOSPITAL_COMMUNITY): Payer: Medicare Other | Admitting: Occupational Therapy

## 2017-02-11 DIAGNOSIS — R791 Abnormal coagulation profile: Secondary | ICD-10-CM

## 2017-02-11 LAB — CBC
HCT: 29 % — ABNORMAL LOW (ref 39.0–52.0)
HEMOGLOBIN: 9.4 g/dL — AB (ref 13.0–17.0)
MCH: 29.8 pg (ref 26.0–34.0)
MCHC: 32.4 g/dL (ref 30.0–36.0)
MCV: 92.1 fL (ref 78.0–100.0)
Platelets: 256 10*3/uL (ref 150–400)
RBC: 3.15 MIL/uL — ABNORMAL LOW (ref 4.22–5.81)
RDW: 14.8 % (ref 11.5–15.5)
WBC: 9.2 10*3/uL (ref 4.0–10.5)

## 2017-02-11 LAB — RENAL FUNCTION PANEL
ANION GAP: 12 (ref 5–15)
Albumin: 2.8 g/dL — ABNORMAL LOW (ref 3.5–5.0)
BUN: 117 mg/dL — AB (ref 6–20)
CALCIUM: 9 mg/dL (ref 8.9–10.3)
CO2: 20 mmol/L — AB (ref 22–32)
Chloride: 98 mmol/L — ABNORMAL LOW (ref 101–111)
Creatinine, Ser: 8.55 mg/dL — ABNORMAL HIGH (ref 0.61–1.24)
GFR calc Af Amer: 6 mL/min — ABNORMAL LOW (ref >60)
GFR calc non Af Amer: 5 mL/min — ABNORMAL LOW (ref >60)
GLUCOSE: 97 mg/dL (ref 65–99)
PHOSPHORUS: 7.3 mg/dL — AB (ref 2.5–4.6)
Potassium: 3.9 mmol/L (ref 3.5–5.1)
SODIUM: 130 mmol/L — AB (ref 135–145)

## 2017-02-11 LAB — GLUCOSE, CAPILLARY
GLUCOSE-CAPILLARY: 195 mg/dL — AB (ref 65–99)
GLUCOSE-CAPILLARY: 103 mg/dL — AB (ref 65–99)
Glucose-Capillary: 59 mg/dL — ABNORMAL LOW (ref 65–99)
Glucose-Capillary: 128 mg/dL — ABNORMAL HIGH (ref 65–99)
Glucose-Capillary: 203 mg/dL — ABNORMAL HIGH (ref 65–99)

## 2017-02-11 LAB — PROTIME-INR
INR: 4.48 — AB
PROTHROMBIN TIME: 43.8 s — AB (ref 11.4–15.2)

## 2017-02-11 MED ORDER — LIDOCAINE HCL (PF) 1 % IJ SOLN: 5.0000 mL | INTRAMUSCULAR | Status: DC | PRN

## 2017-02-11 MED ORDER — HEPARIN SODIUM (PORCINE) 1000 UNIT/ML DIALYSIS: 1000.0000 [IU] | INTRAMUSCULAR | Status: DC | PRN

## 2017-02-11 MED ORDER — SODIUM CHLORIDE 0.9 % IV SOLN: 100.0000 mL | INTRAVENOUS | Status: DC | PRN

## 2017-02-11 MED ORDER — SENNOSIDES-DOCUSATE SODIUM 8.6-50 MG PO TABS
2.0000 | ORAL_TABLET | Freq: Every day | ORAL | Status: DC
  Administered 2017-02-11 – 2017-02-14 (×4): 2 via ORAL
  Filled 2017-02-11 (×5): qty 2

## 2017-02-11 MED ORDER — LIDOCAINE-PRILOCAINE 2.5-2.5 % EX CREA: 1.0000 "application " | TOPICAL_CREAM | CUTANEOUS | Status: DC | PRN

## 2017-02-11 MED ORDER — PENTAFLUOROPROP-TETRAFLUOROETH EX AERO: 1.0000 "application " | INHALATION_SPRAY | CUTANEOUS | Status: DC | PRN

## 2017-02-11 MED ORDER — ALTEPLASE 2 MG IJ SOLR: 2.0000 mg | Freq: Once | INTRAMUSCULAR | Status: DC | PRN

## 2017-02-11 NOTE — Progress Notes (Signed)
Assessment/Plan:  1. R subcortical vs brainstem stroke. w/ gait dysfct 2. Gross Hematuria/Urinary retention -Dr. Tresa Moore [facility] saw 5/19. Required I/O cath 5./36for 450 cc urine 3. ESRD - TTS. Weak post treatment , no obvious volume excess. BP ok, Next HD Today 4. HD access - R AVF. Required declot/PTA at [facility] 01/28/17 PTA. No issues here. Up to 15 ga needles.  5. H/o A. Fib/ Pacemaker- Warfarin dosing per pharmacy.  6. Orthostatic hypotension/syncope. Neuro is OK with midodrine if needed. BP goal 120-150 per Neuro (with significant intracranial disease is at risk for further cerebral ischemia if hypotension  Subjective: Interval History: Stable  Objective: Vital signs in last 24 hours: Temp:  [97.5 F (36.4 C)-97.7 F (36.5 C)] 97.7 F (36.5 C) (05/29 1345) Pulse Rate:  [87-101] 87 (05/29 1345) Resp:  [18-21] 18 (05/29 1345) BP: (146-159)/(52-64) 159/52 (05/29 1345) SpO2:  [100 %] 100 % (05/29 1345) Weight change:   Intake/Output from previous day: 05/28 0701 - 05/29 0700 In: 780 [P.O.:780] Out: -  Intake/Output this shift: Total I/O In: 600 [P.O.:600] Out: 225 [Urine:225]  General appearance: alert and cooperative Extremities: extremities normal, atraumatic, no cyanosis or edema RUE AVF  Lab Results:  Recent Labs  02/10/17 0559  WBC 9.8  HGB 9.7*  HCT 29.8*  PLT 236   BMET:  Recent Labs  02/10/17 0559  NA 135  K 3.5  CL 95*  CO2 26  GLUCOSE 110*  BUN 72*  CREATININE 6.66*  CALCIUM 8.7*   No results for input(s): PTH in the last 72 hours. Iron Studies: No results for input(s): IRON, TIBC, TRANSFERRIN, FERRITIN in the last 72 hours. Studies/Results: No results found.  Scheduled: . aspirin EC  81 mg Oral Daily  . calcium acetate  1,334 mg Oral TID WC  . carvedilol  3.125 mg Oral BID WC  . darbepoetin (ARANESP) injection - DIALYSIS  60 mcg Intravenous Q Tue-HD  . doxercalciferol  4 mcg Intravenous Q T,Th,Sa-HD  . ezetimibe  10 mg Oral Daily  .  feeding supplement (PRO-STAT SUGAR FREE 64)  30 mL Oral BID  . fenofibrate  160 mg Oral Daily  . finasteride  5 mg Oral Daily  . glimepiride  1 mg Oral Q breakfast  . insulin aspart  0-5 Units Subcutaneous QHS  . insulin aspart  0-9 Units Subcutaneous TID WC  . ipratropium  2 spray Each Nare Daily  . multivitamin  1 tablet Oral QHS  . rOPINIRole  0.5 mg Oral QHS  . senna-docusate  2 tablet Oral QHS  . Warfarin - Pharmacist Dosing Inpatient   Does not apply q1800    LOS: 5 days   Olive Zmuda C 02/11/2017,3:52 PM

## 2017-02-11 NOTE — Progress Notes (Signed)
Physical Therapy Session Note  Patient Details  Name: James David MRN: [id number] Date of Birth: February 16, 1936  Today's Date: 02/11/2017 PT Individual Time: 1100-1200 PT Individual Time Calculation (min): 60 min   Short Term Goals: Week 1:  PT Short Term Goal 1 (Week 1): STG=LTG due to estimated length of stay   Skilled Therapeutic Interventions/Progress Updates: Pt presented in recliner agreeable to therapy. Performed sit to/from stand with [facility] supervision. Pt ambulated to bathroom SPC min guard/close S, void while standing supervision. Pt ambulated to rehab gym with [facility] supervision with cues for increasing B foot clearance. Performed LAQ, standing hip abd, HS pulls, heel/toe raises 2 x 10 bilaterally. Side stepping at parallel bars stepping over obstacles. Perforned pipe tree standing on Airex for balance and coordination. Pt required x 2 min cues for recall of objects, and demonstrated no LOB during activities. Pt ambulated in hallway picking up numbers for visual scanning and obstacle negotiation. Performed NuStep L3 x 7 min for cardiovascular and endurance. Pt ambulated back to room with [facility] requiring increased cues for B foot clearance. Pt request to sit in straight chair due to discomfort, call bell and phone within reach with tray in front of pt and NT notified.      Therapy Documentation Precautions:  Precautions Precautions: Fall Restrictions Weight Bearing Restrictions: No General:   Vital Signs: Therapy Vitals Temp: 97.7 F (36.5 C) Temp Source: Oral Pulse Rate: 87 Resp: 18 BP: (!) 159/52 Patient Position (if appropriate): Sitting Oxygen Therapy SpO2: 100 % O2 Device: Not Delivered   See Function Navigator for Current Functional Status.   Therapy/Group: Individual Therapy  Theron Cumbie  Arvil Utz, PTA  02/11/2017, 4:39 PM

## 2017-02-11 NOTE — Progress Notes (Signed)
ANTICOAGULATION CONSULT NOTE - Follow Up Consult  Pharmacy Consult for coumadin Indication: atrial fibrillation in setting of acute stroke/TIA    Allergies  Allergen Reactions  . Amlodipine Besylate Swelling    angioedema  . Lisinopril Other (See Comments)    Renal insufficiency  . Statins Other (See Comments)    Muscle aches   Patient Measurements: Height: 5\' 10"  (177.8 cm) Weight: 172 lb 2.9 oz (78.1 kg) IBW/kg (Calculated) : 73  Assessment: 39 yoM with atrial fibrillation in setting of acute stroke/TIA on warfarin. INR labile and appears to be very sensitive. INR remains elevated today but down slightly to 4.48. Hgb 9.7, plts wnl on 5/28.  Goal of Therapy:  INR 2-3 Monitor platelets by anticoagulation protocol: Yes   Plan:  Hold Coumadin tonight again Monitor daily INR, CBC, s/s of bleed  Elenor Quinones, PharmD, [facility] Clinical Pharmacist Pager [phone] 02/11/2017 5:40 PM

## 2017-02-11 NOTE — Progress Notes (Signed)
Occupational Therapy Session Note  Patient Details  Name: James David MRN: [id number] Date of Birth: October 22, 1935  Today's Date: 02/11/2017 OT Individual Time: 4034-7425 OT Individual Time Calculation (min): 75 min    Short Term Goals: Week 1:  OT Short Term Goal 1 (Week 1): Pt will maintain standing balance with close supervision while obtaining clothing in prep for dressing  OT Short Term Goal 2 (Week 1): Pt will demonstrate intellecual awareness of dec attention to the left in functional tasks OT Short Term Goal 3 (Week 1): Pt will don LB clothing sit to stand with supervision  OT Short Term Goal 4 (Week 1): Pt will perform 3/3 grooming tasks in standing with supervision   Skilled Therapeutic Interventions/Progress Updates:    Pt seen for OT ADL bathing/dressing session. Pt in supine upon arrival, voicing increased fatigue this session, however, willing to attempt therapy. He ambulated throughout session with SPC and close supervision. He gathered clothing items from drawers, bending down to obtain items from low counter levels.  He bathed seated on shower chair with supervision, standing with grab bars to complete pericare/ buttock hygiene. He stood for last portion of shower to rinse offer, demonstrating good functional standing balance, however, educated and recommended pt complete bathing tasks in seated position for energy conservation as pt with poor activity tolerance. Pt with complaints of constipation throughout session, removing small amounts of stool when wiping, however, unable to empty. RN made aware and disimpacting bowels, following this, pt able to have successful continent BM. Completed toileting tasks with supervision, using grab bars for steadying assist.  Following seated rest break, pt completed grooming tasks in standing with distant supervision. He then ambulated to family room with [facility] with supervision. Seated rest break taken in low soft recliner and with increased  time and VCs for technique pt able to stand with supervision and use of cane. Pt ambulated back to room at end of session, left seated in recliner with all needs in reach.  Discussed d/c planning throughout session, pt beginning to feel more comfortable with balance and feeling he is approaching level he would feel comfortable at at home. Team conference tomorrow to discuss pt progress and determine d/c date.   Therapy Documentation Precautions:  Precautions Precautions: Fall Restrictions Weight Bearing Restrictions: No Pain:   No/ denies pain ADL: ADL ADL Comments: see functional navigator  See Function Navigator for Current Functional Status.   Therapy/Group: Individual Therapy  [name], Shantel Wesely C 02/11/2017, 7:10 AM

## 2017-02-11 NOTE — Progress Notes (Signed)
Subjective/Complaints: Didn't sleep as well. Complains of constipation. Otherwise doing ok  ROS: pt denies nausea, vomiting, diarrhea, cough, shortness of breath or chest pain  Objective: Vital Signs: Blood pressure (!) 153/64, pulse 98, temperature 97.5 F (36.4 C), temperature source Oral, resp. rate 19, height 5' 10"  (1.778 m), weight 78.1 kg (172 lb 2.9 oz), SpO2 100 %. No results found. Results for orders placed or performed during the hospital encounter of 02/06/17 (from the past 72 hour(s))  Glucose, capillary     Status: Abnormal   Collection Time: 02/08/17 12:08 PM  Result Value Ref Range   Glucose-Capillary 267 (H) 65 - 99 mg/dL  Glucose, capillary     Status: None   Collection Time: 02/08/17  9:28 PM  Result Value Ref Range   Glucose-Capillary 78 65 - 99 mg/dL  Protime-INR     Status: Abnormal   Collection Time: 02/09/17  5:37 AM  Result Value Ref Range   Prothrombin Time 22.8 (H) 11.4 - 15.2 seconds   INR 1.98   Glucose, capillary     Status: Abnormal   Collection Time: 02/09/17  6:19 AM  Result Value Ref Range   Glucose-Capillary 102 (H) 65 - 99 mg/dL  Glucose, capillary     Status: Abnormal   Collection Time: 02/09/17 11:48 AM  Result Value Ref Range   Glucose-Capillary 256 (H) 65 - 99 mg/dL  Glucose, capillary     Status: Abnormal   Collection Time: 02/09/17  4:55 PM  Result Value Ref Range   Glucose-Capillary 126 (H) 65 - 99 mg/dL  Glucose, capillary     Status: Abnormal   Collection Time: 02/09/17  9:14 PM  Result Value Ref Range   Glucose-Capillary 169 (H) 65 - 99 mg/dL  Glucose, capillary     Status: Abnormal   Collection Time: 02/10/17  5:37 AM  Result Value Ref Range   Glucose-Capillary 104 (H) 65 - 99 mg/dL  Protime-INR     Status: Abnormal   Collection Time: 02/10/17  5:59 AM  Result Value Ref Range   Prothrombin Time 47.0 (H) 11.4 - 15.2 seconds   INR 4.89 (HH)     Comment: REPEATED TO VERIFY CRITICAL RESULT CALLED TO, READ BACK BY AND  VERIFIED WITH: DENISE LLOYD,RN AT 0730 02/10/17 BY ZBEECH.   Renal function panel     Status: Abnormal   Collection Time: 02/10/17  5:59 AM  Result Value Ref Range   Sodium 135 135 - 145 mmol/L   Potassium 3.5 3.5 - 5.1 mmol/L   Chloride 95 (L) 101 - 111 mmol/L   CO2 26 22 - 32 mmol/L   Glucose, Bld 110 (H) 65 - 99 mg/dL   BUN 72 (H) 6 - 20 mg/dL   Creatinine, Ser 6.66 (H) 0.61 - 1.24 mg/dL   Calcium 8.7 (L) 8.9 - 10.3 mg/dL   Phosphorus 6.4 (H) 2.5 - 4.6 mg/dL   Albumin 2.8 (L) 3.5 - 5.0 g/dL   GFR calc non Af Amer 7 (L) >60 mL/min   GFR calc Af Amer 8 (L) >60 mL/min    Comment: (NOTE) The eGFR has been calculated using the CKD EPI equation. This calculation has not been validated in all clinical situations. eGFR's persistently <60 mL/min signify possible Chronic Kidney Disease.    Anion gap 14 5 - 15  CBC     Status: Abnormal   Collection Time: 02/10/17  5:59 AM  Result Value Ref Range   WBC 9.8 4.0 - 10.5 K/uL  RBC 3.22 (L) 4.22 - 5.81 MIL/uL   Hemoglobin 9.7 (L) 13.0 - 17.0 g/dL   HCT 29.8 (L) 39.0 - 52.0 %   MCV 92.5 78.0 - 100.0 fL   MCH 30.1 26.0 - 34.0 pg   MCHC 32.6 30.0 - 36.0 g/dL   RDW 14.5 11.5 - 15.5 %   Platelets 236 150 - 400 K/uL  Protime-INR     Status: Abnormal   Collection Time: 02/10/17 10:33 AM  Result Value Ref Range   Prothrombin Time 45.7 (H) 11.4 - 15.2 seconds   INR 4.72 (HH)     Comment: REPEATED TO VERIFY CRITICAL RESULT CALLED TO, READ BACK BY AND VERIFIED WITH: DENISE LLOYD,RN AT 2423 02/10/17 BY ZBEECH.   Glucose, capillary     Status: Abnormal   Collection Time: 02/10/17 12:17 PM  Result Value Ref Range   Glucose-Capillary 231 (H) 65 - 99 mg/dL  Glucose, capillary     Status: Abnormal   Collection Time: 02/10/17  4:26 PM  Result Value Ref Range   Glucose-Capillary 183 (H) 65 - 99 mg/dL  Glucose, capillary     Status: Abnormal   Collection Time: 02/10/17  9:07 PM  Result Value Ref Range   Glucose-Capillary 153 (H) 65 - 99 mg/dL   Glucose, capillary     Status: Abnormal   Collection Time: 02/11/17  6:22 AM  Result Value Ref Range   Glucose-Capillary 59 (L) 65 - 99 mg/dL  Glucose, capillary     Status: Abnormal   Collection Time: 02/11/17  7:27 AM  Result Value Ref Range   Glucose-Capillary 128 (H) 65 - 99 mg/dL     HEENT: PERRL Caridac:  RRR CHest: CTA B Extremity:  Pulses positive and No Edema Skin:   Bruising on both arms Neuro: Alert/Oriented, Normal Sensory and Abnormal Motor 4 to 4+/5, bilateral deltoid by stress, grip, hip flexor and extensor, ankle dorsiflexor--good sitting balance Musc/Skel:  Other No pain with upper extremity or lower extremity range of motion Gen. no acute distress   Assessment/Plan: 1. Functional deficits secondary to Right subcortical infarct which require 3+ hours per day of interdisciplinary therapy in a comprehensive inpatient rehab setting. Physiatrist is providing close team supervision and 24 hour management of active medical problems listed below. Physiatrist and rehab team continue to assess barriers to discharge/monitor patient progress toward functional and medical goals. FIM: Function - Bathing Position: Shower Body parts bathed by patient: Right arm, Right upper leg, Left arm, Left upper leg, Abdomen, Chest, Right lower leg, Front perineal area, Left lower leg, Buttocks, Back Assist Level: Supervision or verbal cues  Function- Upper Body Dressing/Undressing What is the patient wearing?: Pull over shirt/dress Pull over shirt/dress - Perfomed by patient: Thread/unthread right sleeve, Thread/unthread left sleeve, Put head through opening, Pull shirt over trunk Assist Level: Supervision or verbal cues Function - Lower Body Dressing/Undressing What is the patient wearing?: Underwear, Pants, [employer], Shoes Position: Other (comment) (Sitting on toilet) Underwear - Performed by patient: Thread/unthread right underwear leg, Thread/unthread left underwear leg, Pull  underwear up/down Pants- Performed by patient: Thread/unthread right pants leg, Thread/unthread left pants leg, Pull pants up/down, Fasten/unfasten pants Shoes - Performed by patient: Don/doff right shoe, Don/doff left shoe TED Hose - Performed by helper: Don/doff right TED hose, Don/doff left TED hose Assist for footwear: Supervision/touching assist Assist for lower body dressing: Supervision or verbal cues  Function - Toileting Toileting steps completed by patient: Adjust clothing prior to toileting, Performs perineal hygiene, Adjust clothing  after toileting Toileting steps completed by helper: Adjust clothing after toileting Assist level: Supervision or verbal cues  Function - Toilet Transfers Toilet transfer assistive device: Grab bar, Walker Assist level to toilet: Supervision or verbal cues Assist level from toilet: Touching or steadying assistance (Pt > 75%)  Function - Chair/bed transfer Chair/bed transfer method: Stand pivot Chair/bed transfer assist level: Touching or steadying assistance (Pt > 75%) Chair/bed transfer assistive device: Armrests  Function - Locomotion: Wheelchair Will patient use wheelchair at discharge?: No Type: Manual Max wheelchair distance: 125f  Assist Level: Touching or steadying assistance (Pt > 75%) Assist Level: Touching or steadying assistance (Pt > 75%) Wheel 150 feet activity did not occur: Refused Turns around,maneuvers to table,bed, and toilet,negotiates 3% grade,maneuvers on rugs and over doorsills: No Function - Locomotion: Ambulation Assistive device: No device Max distance: 1552f Assist level: Touching or steadying assistance (Pt > 75%) Assist level: Touching or steadying assistance (Pt > 75%) Assist level: Touching or steadying assistance (Pt > 75%) Walk 150 feet activity did not occur: Safety/medical concerns Assist level: Touching or steadying assistance (Pt > 75%) Assist level: Touching or steadying assistance (Pt >  75%)  Function - Comprehension Comprehension: Auditory Comprehension assist level: Follows basic conversation/direction with no assist  Function - Expression Expression: Verbal Expression assist level: Expresses basic needs/ideas: With no assist  Function - Social Interaction Social Interaction assist level: Interacts appropriately with others - No medications needed.  Function - Problem Solving Problem solving assist level: Solves basic problems with no assist  Function - Memory Memory assist level: Recognizes or recalls 90% of the time/requires cueing < 10% of the time Patient normally able to recall (first 3 days only): Current season, Location of own room, That he or she is in a hospital  Medical Problem List and Plan: 1. Gait disorder secondary to right subcortical infarct  -continue  PT, OT --progressing toward goals 2. DVT Prophylaxis/Anticoagulation: Coumadin. INR 4.72 on last check  -holding coumadin    -F/U INR pending today 3. Pain Management: Tylenol as needed 4. Mood: Provide emotional support 5. Neuropsych: This patient iscapable of making decisions on hisown behalf. 6. Skin/Wound Care: Routine skin checks 7. Fluids/Electrolytes/Nutrition: encourage PO 8.Atrial fibrillation. Continue Coumadin therapy. Cardiac rate controlled. Follow-up cardiology services 9.ESRD. Continue hemodialysis as per renal services 10.Diabetes mellitus of peripheral neuropathy. Hemoglobin A1c 7.0.SSI.Check blood sugars before meals and at bedtime. Patient on Amaryl 4 mg twice a day prior to admission.    -some lability recently  -intake is consistent 11.Acute on chronic anemia. Continue Aranesp 12.Hypertension. Coreg 3.125 mg twice a day 13.BPH. Uroxatral 10 mg daily, Proscar 5 mg daily. uroxatral held for urinary retention 14.Hyperlipidemia. Zetia 15. Constipation: augment regimen  LOS (Days) 5 A FACE TO FACE EVALUATION WAS PERFORMED  Erricka Falkner T 02/11/2017, 8:42  AM

## 2017-02-11 NOTE — Significant Event (Signed)
CRITICAL VALUE ALERT  Critical Value:  INR 4.48  Date & Time Notied:  5-29 1700  Provider Notified: Silvestre Mesi. PA  Orders Received/Actions taken: No new orders. Pharm. To follow up. follwing labs

## 2017-02-12 ENCOUNTER — Inpatient Hospital Stay (HOSPITAL_COMMUNITY): Payer: Medicare Other | Admitting: Occupational Therapy

## 2017-02-12 ENCOUNTER — Inpatient Hospital Stay (HOSPITAL_COMMUNITY): Payer: Medicare Other | Admitting: Speech Pathology

## 2017-02-12 ENCOUNTER — Inpatient Hospital Stay (HOSPITAL_COMMUNITY): Payer: Medicare Other | Admitting: Physical Therapy

## 2017-02-12 LAB — GLUCOSE, CAPILLARY
GLUCOSE-CAPILLARY: 108 mg/dL — AB (ref 65–99)
GLUCOSE-CAPILLARY: 133 mg/dL — AB (ref 65–99)
GLUCOSE-CAPILLARY: 120 mg/dL — AB (ref 65–99)
Glucose-Capillary: 163 mg/dL — ABNORMAL HIGH (ref 65–99)

## 2017-02-12 LAB — PROTIME-INR
INR: 3.91
Prothrombin Time: 39.3 seconds — ABNORMAL HIGH (ref 11.4–15.2)

## 2017-02-12 MED ORDER — GABAPENTIN 100 MG PO CAPS
100.0000 mg | ORAL_CAPSULE | Freq: Every day | ORAL | Status: DC
  Administered 2017-02-12 – 2017-02-14 (×3): 100 mg via ORAL
  Filled 2017-02-12 (×3): qty 1

## 2017-02-12 NOTE — Progress Notes (Signed)
Physical Therapy Session Note  Patient Details  Name: James David MRN: [id number] Date of Birth: [birth year]  Today's Date: 02/12/2017 PT Individual Time: 1419-1445 PT Individual Time Calculation (min): 26 min   Short Term Goals: Week 1:  PT Short Term Goal 1 (Week 1): STG=LTG due to estimated length of stay   Skilled Therapeutic Interventions/Progress Updates:   Pt received sitting in [facility] and agreeable to PT  Gait with SPC x 142f and supervision assist. Min cues for Ad management in turns.  Prolonged therapeutic rest break due to fatigue and cramping.  Sit<>stand transfer training completed x 4 throughout treatment with supervision assist from PT and min cues for improved anterior weight shift to improve use of momentum to come to standing.  Curb step training with SPC and min assist from PT. Min verbal cues for proper use of SPC and step-to gait pattern. Pt reports increased cramping in hands and arms following curb training.   [facility] and all needs met.        Therapy Documentation Precautions:  Precautions Precautions: Fall Restrictions Weight Bearing Restrictions: No General: PT Amount of Missed Time (min): 60 Minutes PT Missed Treatment Reason: Patient fatigue Vital Signs: Therapy Vitals Temp: 98.7 F (37.1 C) Temp Source: Oral Pulse Rate: 86 Resp: 17 BP: (!) 137/55 Patient Position (if appropriate): Sitting Oxygen Therapy SpO2: 98 % O2 Device: Not Delivered Pain:   0/10 ( just fatigued)   See Function Navigator for Current Functional Status.   Therapy/Group: Individual Therapy  ALorie Phenix5/30/2018, 2:50 PM

## 2017-02-12 NOTE — Patient Care Conference (Signed)
Inpatient RehabilitationTeam Conference and Plan of Care Update Date: 02/12/2017   Time: 10:25 AM    Patient Name: James David      Medical Record Number: [id number]  Date of Birth: July 17, 1936 Sex: Male         Room/Bed: 4W03C/4W03C-01 Payor Info: Payor: [employer] MEDICARE / Plan: [employer] MEDICARE / Product Type: *No Product type* /    Admitting Diagnosis: rt cva  Admit Date/Time:  02/06/2017  4:18 PM Admission Comments: No comment available   Primary Diagnosis:  <principal problem not specified> Principal Problem: <principal problem not specified>  Patient Active Problem List   Diagnosis Date Noted  . Gait disturbance, post-stroke   . Subcortical infarction ([place]) 02/06/2017  . Intracranial vascular stenosis 02/04/2017  . Syncope   . Orthostatic hypotension   . Stroke ([place]) 02/01/2017  . TIA (transient ischemic attack) 01/29/2017  . Hypertension 01/29/2017  . CKD (chronic kidney disease) stage V requiring chronic dialysis ([place]) 01/29/2017  . DDD (degenerative disc disease) 01/29/2017  . HLD (hyperlipidemia) 01/29/2017  . Second degree heart block 01/29/2017  . Atrial flutter ([place]) 01/29/2017  . ESRD (end stage renal disease) on dialysis ([place])   . Melanoma of skin ([place]) 03/13/2016  . Atrial flutter ([place]) 06/26/2015  . Pacemaker 10/13/2014  . End stage renal disease ([place]) 07/15/2014  . Chronic diastolic heart failure ([place]) 03/24/2014  . Second-degree heart block 12/13/2013  . Chronic kidney disease, stage IV (severe) ([place]) 12/13/2013  . Essential hypertension 12/13/2013  . Diabetes mellitus due to underlying condition with diabetic nephropathy ([place]) 12/13/2013  . Hyperlipidemia 12/13/2013    Expected Discharge Date: Expected Discharge Date: 02/15/17  Team Members Present: Physician leading conference: Dr. Delice Lesch Social Worker Present: Ovidio Kin, LCSW Nurse Present: Other (comment) Luiz Blare) PT Present: Barrie Folk, PT;[name] Dechalus, PTA OT  Present: Napoleon Form, OT SLP Present: Stormy Fabian, SLP PPS Coordinator present : Daiva Nakayama, RN, CRRN     Current Status/Progress Goal Weekly Team Focus  Medical   right subcortical infarct. left hemiparesis, ESRD, HTN  improve functional mobility  renal, nutrition, constipaion, diabetes control   Bowel/Bladder   Patient continent of B/B.  Voids per urinal or bathroom and bowel movements in bathroom.   Patient to remain continent while on IPR.  Continue to assess patient for B/B needs.  Assist patient to BR to void or have bm.   Swallow/Nutrition/ Hydration             ADL's   Supervision- min A overal  Supervision- mod I overall  Activity tolerance, standing balance/ endurance, family training, d/c planning   Mobility   Min guard to supervision with transfers, supervision gait 110ft with SPC  Mod I with LRAD  endurance, balance, coordination   Communication             Safety/Cognition/ Behavioral Observations  Min A to supervision with complex problem solving, recall complex problem solving and anticipatory awareness  Supervision  anticipatory awareness and use of external memory aids to recall information   Pain   Patient c/o pain to bilateral lower extremities.  States pain level >5.  Patient to have decrease in pain level <2 while on IPR.  Continue to monitor and assess patient for pain and medicate as needed.   Skin   Patient with multiple skin tears and bruising noted to bilateral upper extremities.  Tegaderm dressings and foam dressings in place.  Patient to maintain skin intergrity while on IPR.  Continue to  monitor skin integrity and maintain healing progress of existing skin tears and bruising.      *See Care Plan and progress notes for long and short-term goals.  Barriers to Discharge: left hemiparesis,     Possible Resolutions to Barriers:  adaptive techniques and equipment    Discharge Planning/Teaching Needs:  HOme with wife who can be there but has cardiac  issues. Pt is high level and doing well here.      Team Discussion:  Goals mod/i level. Got back form HD late last night and doesn't feel well today. Fatigue is his biggest issue. SP asked to evaluate. Cramping on right side given pain meds. MD adjusting meds.  Revisions to Treatment Plan:  DC 6/2   Continued Need for Acute Rehabilitation Level of Care: The patient requires daily medical management by a physician with specialized training in physical medicine and rehabilitation for the following conditions: Daily direction of a multidisciplinary physical rehabilitation program to ensure safe treatment while eliciting the highest outcome that is of practical value to the patient.: Yes Daily medical management of patient stability for increased activity during participation in an intensive rehabilitation regime.: Yes Daily analysis of laboratory values and/or radiology reports with any subsequent need for medication adjustment of medical intervention for : Neurological problems;Diabetes problems;Blood pressure problems  Jonee Lamore, Gardiner Rhyme 02/13/2017, 9:48 AM

## 2017-02-12 NOTE — Progress Notes (Signed)
ANTICOAGULATION CONSULT NOTE - Follow Up Consult  Pharmacy Consult for coumadin Indication: atrial fibrillation in setting of acute stroke/TIA    Allergies  Allergen Reactions  . Amlodipine Besylate Swelling    angioedema  . Lisinopril Other (See Comments)    Renal insufficiency  . Statins Other (See Comments)    Muscle aches   Patient Measurements: Height: 5\' 10"  (177.8 cm) Weight: 174 lb 13.2 oz (79.3 kg) IBW/kg (Calculated) : 73  Assessment: 88 yoM with atrial fibrillation in setting of acute stroke/TIA on warfarin. INR labile and appears to be very sensitive. INR remains supratherapeutic but down to 3.91 this morning. Hgb 9.4, plts wnl on 5/29.  Goal of Therapy:  INR 2-3 Monitor platelets by anticoagulation protocol: Yes   Plan:  Hold Coumadin tonight again Monitor daily INR, CBC, s/s of bleed  Maryanna Shape, PharmD, BCPS  Clinical Pharmacist  Pager: [phone]   02/12/2017 9:22 AM

## 2017-02-12 NOTE — Progress Notes (Signed)
Physical Therapy Session Note  Patient Details  Name: KYCE GING MRN: [id number] Date of Birth: 1936/08/26  Today's Date: 02/12/2017 PT Individual Time:   Missed 60 min skilled therapy due to pt fatigue  Short Term Goals: Week 1:  PT Short Term Goal 1 (Week 1): STG=LTG due to estimated length of stay   Skilled Therapeutic Interventions/Progress Updates: Pt asleep upon arrival. Pt stating too fatigued to participate as returned from HD 2:30am. Will continue efforts.      Therapy Documentation Precautions:  Precautions Precautions: Fall Restrictions Weight Bearing Restrictions: No General:   Vital Signs:  Pain: Pain Assessment Pain Assessment: No/denies pain   Therapy/Group: Individual Therapy  Merville Hijazi  Eleny Cortez, PTA  02/12/2017, 12:09 PM

## 2017-02-12 NOTE — Progress Notes (Signed)
Speech Language Pathology Daily Session Note  Patient Details  Name: James David MRN: [id number] Date of Birth: 12-03-1935  Today's Date: 02/12/2017   Skilled treatment session #1 SLP Individual Time: 0945-1000 SLP Individual Time Calculation (min): 15 min   Skilled treatment session #2 SLP Individual Time: 1050-1130 SLP Individual Time Calculation (min): 40 min  Short Term Goals: Week 1: SLP Short Term Goal 1 (Week 1): short term goals=long term goals  Skilled Therapeutic Interventions:  Skilled treatment session #1 focused on cognition goals. Pt initially resistive to therapy d/t fatigue from late session with HD. However given time and encouragement, pt able to participate. Pt required Min A to list activities safe for him to participate in within his home environment. Pt left in recliner and all needs within reach.   Skilled treatment session #2 focused cognition goals. SLP facilitated session by providing handout on compensatory memory strategies. Pt able to read and return demonstration of information. Handout also given on medication management. Pt left upright in wheelchair, safety belt donned and all needs within reach. Continue per current plan of care.      Function:  Eating Eating   Modified Consistency Diet: No Eating Assist Level: No help, No cues           Cognition Comprehension Comprehension assist level: Follows basic conversation/direction with no assist  Expression   Expression assist level: Expresses basic needs/ideas: With no assist  Social Interaction Social Interaction assist level: Interacts appropriately with others - No medications needed.  Problem Solving Problem solving assist level: Solves basic problems with no assist  Memory Memory assist level: Recognizes or recalls 90% of the time/requires cueing < 10% of the time    Pain    Therapy/Group: Individual Therapy  Anthonymichael Munday 02/12/2017, 4:26 PM

## 2017-02-12 NOTE — Progress Notes (Signed)
Subjective/Complaints: Had a long night. Didn'David get back from HD until 0230. Had cramping in hands/arms afterward for awhile  ROS: pt denies nausea, vomiting, diarrhea, cough, shortness of breath or chest pain   Objective: Vital Signs: Blood pressure (!) 135/57, pulse 97, temperature 97.3 F (36.3 C), temperature source Oral, resp. rate 18, height 5' 10"  (1.778 m), weight 79.3 kg (174 lb 13.2 oz), SpO2 100 %. No results found. Results for orders placed or performed during the hospital encounter of 02/06/17 (from the past 72 hour(s))  Glucose, capillary     Status: Abnormal   Collection Time: 02/09/17 11:48 AM  Result Value Ref Range   Glucose-Capillary 256 (H) 65 - 99 mg/dL  Glucose, capillary     Status: Abnormal   Collection Time: 02/09/17  4:55 PM  Result Value Ref Range   Glucose-Capillary 126 (H) 65 - 99 mg/dL  Glucose, capillary     Status: Abnormal   Collection Time: 02/09/17  9:14 PM  Result Value Ref Range   Glucose-Capillary 169 (H) 65 - 99 mg/dL  Glucose, capillary     Status: Abnormal   Collection Time: 02/10/17  5:37 AM  Result Value Ref Range   Glucose-Capillary 104 (H) 65 - 99 mg/dL  Protime-INR     Status: Abnormal   Collection Time: 02/10/17  5:59 AM  Result Value Ref Range   Prothrombin Time 47.0 (H) 11.4 - 15.2 seconds   INR 4.89 (HH)     Comment: REPEATED TO VERIFY CRITICAL RESULT CALLED TO, READ BACK BY AND VERIFIED WITH: DENISE LLOYD,RN AT 0730 02/10/17 BY ZBEECH.   Renal function panel     Status: Abnormal   Collection Time: 02/10/17  5:59 AM  Result Value Ref Range   Sodium 135 135 - 145 mmol/L   Potassium 3.5 3.5 - 5.1 mmol/L   Chloride 95 (L) 101 - 111 mmol/L   CO2 26 22 - 32 mmol/L   Glucose, Bld 110 (H) 65 - 99 mg/dL   BUN 72 (H) 6 - 20 mg/dL   Creatinine, Ser 6.66 (H) 0.61 - 1.24 mg/dL   Calcium 8.7 (L) 8.9 - 10.3 mg/dL   Phosphorus 6.4 (H) 2.5 - 4.6 mg/dL   Albumin 2.8 (L) 3.5 - 5.0 g/dL   GFR calc non Af Amer 7 (L) >60 mL/min   GFR  calc Af Amer 8 (L) >60 mL/min    Comment: (NOTE) The eGFR has been calculated using the CKD EPI equation. This calculation has not been validated in all clinical situations. eGFR's persistently <60 mL/min signify possible Chronic Kidney Disease.    Anion gap 14 5 - 15  CBC     Status: Abnormal   Collection Time: 02/10/17  5:59 AM  Result Value Ref Range   WBC 9.8 4.0 - 10.5 K/uL   RBC 3.22 (L) 4.22 - 5.81 MIL/uL   Hemoglobin 9.7 (L) 13.0 - 17.0 g/dL   HCT 29.8 (L) 39.0 - 52.0 %   MCV 92.5 78.0 - 100.0 fL   MCH 30.1 26.0 - 34.0 pg   MCHC 32.6 30.0 - 36.0 g/dL   RDW 14.5 11.5 - 15.5 %   Platelets 236 150 - 400 K/uL  Protime-INR     Status: Abnormal   Collection Time: 02/10/17 10:33 AM  Result Value Ref Range   Prothrombin Time 45.7 (H) 11.4 - 15.2 seconds   INR 4.72 (HH)     Comment: REPEATED TO VERIFY CRITICAL RESULT CALLED TO, READ BACK BY AND VERIFIED  WITH: DENISE LLOYD,RN AT 5883 02/10/17 BY ZBEECH.   Glucose, capillary     Status: Abnormal   Collection Time: 02/10/17 12:17 PM  Result Value Ref Range   Glucose-Capillary 231 (H) 65 - 99 mg/dL  Glucose, capillary     Status: Abnormal   Collection Time: 02/10/17  4:26 PM  Result Value Ref Range   Glucose-Capillary 183 (H) 65 - 99 mg/dL  Glucose, capillary     Status: Abnormal   Collection Time: 02/10/17  9:07 PM  Result Value Ref Range   Glucose-Capillary 153 (H) 65 - 99 mg/dL  Glucose, capillary     Status: Abnormal   Collection Time: 02/11/17  6:22 AM  Result Value Ref Range   Glucose-Capillary 59 (L) 65 - 99 mg/dL  Glucose, capillary     Status: Abnormal   Collection Time: 02/11/17  7:27 AM  Result Value Ref Range   Glucose-Capillary 128 (H) 65 - 99 mg/dL  Glucose, capillary     Status: Abnormal   Collection Time: 02/11/17 12:11 PM  Result Value Ref Range   Glucose-Capillary 203 (H) 65 - 99 mg/dL   Comment 1 Notify RN   Protime-INR     Status: Abnormal   Collection Time: 02/11/17  2:57 PM  Result Value Ref  Range   Prothrombin Time 43.8 (H) 11.4 - 15.2 seconds   INR 4.48 (HH)     Comment: REPEATED TO VERIFY CRITICAL RESULT CALLED TO, READ BACK BY AND VERIFIED WITH: J.HICKS,RN 02/11/17 @1658  BY V.WILKINS   Glucose, capillary     Status: Abnormal   Collection Time: 02/11/17  4:20 PM  Result Value Ref Range   Glucose-Capillary 195 (H) 65 - 99 mg/dL   Comment 1 Notify RN   Glucose, capillary     Status: Abnormal   Collection Time: 02/11/17  8:58 PM  Result Value Ref Range   Glucose-Capillary 103 (H) 65 - 99 mg/dL  Renal function panel     Status: Abnormal   Collection Time: 02/11/17  9:46 PM  Result Value Ref Range   Sodium 130 (L) 135 - 145 mmol/L   Potassium 3.9 3.5 - 5.1 mmol/L   Chloride 98 (L) 101 - 111 mmol/L   CO2 20 (L) 22 - 32 mmol/L   Glucose, Bld 97 65 - 99 mg/dL   BUN 117 (H) 6 - 20 mg/dL   Creatinine, Ser 8.55 (H) 0.61 - 1.24 mg/dL   Calcium 9.0 8.9 - 10.3 mg/dL   Phosphorus 7.3 (H) 2.5 - 4.6 mg/dL   Albumin 2.8 (L) 3.5 - 5.0 g/dL   GFR calc non Af Amer 5 (L) >60 mL/min   GFR calc Af Amer 6 (L) >60 mL/min    Comment: (NOTE) The eGFR has been calculated using the CKD EPI equation. This calculation has not been validated in all clinical situations. eGFR's persistently <60 mL/min signify possible Chronic Kidney Disease.    Anion gap 12 5 - 15  CBC     Status: Abnormal   Collection Time: 02/11/17  9:46 PM  Result Value Ref Range   WBC 9.2 4.0 - 10.5 K/uL   RBC 3.15 (L) 4.22 - 5.81 MIL/uL   Hemoglobin 9.4 (L) 13.0 - 17.0 g/dL   HCT 29.0 (L) 39.0 - 52.0 %   MCV 92.1 78.0 - 100.0 fL   MCH 29.8 26.0 - 34.0 pg   MCHC 32.4 30.0 - 36.0 g/dL   RDW 14.8 11.5 - 15.5 %   Platelets 256 150 -  400 K/uL  Glucose, capillary     Status: Abnormal   Collection Time: 02/12/17  6:28 AM  Result Value Ref Range   Glucose-Capillary 108 (H) 65 - 99 mg/dL  Protime-INR     Status: Abnormal   Collection Time: 02/12/17  6:36 AM  Result Value Ref Range   Prothrombin Time 39.3 (H) 11.4 -  15.2 seconds   INR 3.91      HEENT: PERRL Caridac:  RRR CHest: CTA B Extremity:  Pulses positive and No Edema Skin:   Bruising on both arms persistent Neuro: Alert/Oriented, Normal Sensory and Abnormal Motor 4 to 4+/5, bilateral deltoid by stress, grip, hip flexor and extensor, ankle dorsiflexor--good sitting balance Musc/Skel:  Other No pain with upper extremity or lower extremity range of motion Gen. no acute distress but appears fatigued   Assessment/Plan: 1. Functional deficits secondary to Right subcortical infarct which require 3+ hours per day of interdisciplinary therapy in a comprehensive inpatient rehab setting. Physiatrist is providing close team supervision and 24 hour management of active medical problems listed below. Physiatrist and rehab team continue to assess barriers to discharge/monitor patient progress toward functional and medical goals. FIM: Function - Bathing Position: Shower Body parts bathed by patient: Right arm, Right upper leg, Left arm, Left upper leg, Abdomen, Chest, Right lower leg, Front perineal area, Left lower leg, Buttocks, Back Assist Level: Supervision or verbal cues  Function- Upper Body Dressing/Undressing What is the patient wearing?: Pull over shirt/dress Pull over shirt/dress - Perfomed by patient: Thread/unthread right sleeve, Thread/unthread left sleeve, Put head through opening, Pull shirt over trunk Assist Level: Set up Set up : To obtain clothing/put away Function - Lower Body Dressing/Undressing What is the patient wearing?: Pants, [employer], Shoes, Underwear Position: Sitting EOB Underwear - Performed by patient: Thread/unthread right underwear leg, Thread/unthread left underwear leg, Pull underwear up/down Pants- Performed by patient: Thread/unthread right pants leg, Thread/unthread left pants leg, Pull pants up/down, Fasten/unfasten pants Shoes - Performed by patient: Don/doff right shoe, Don/doff left shoe TED Hose - Performed by  helper: Don/doff right TED hose, Don/doff left TED hose Assist for footwear: Setup Assist for lower body dressing: Touching or steadying assistance (Pt > 75%)  Function - Toileting Toileting steps completed by patient: Adjust clothing prior to toileting, Performs perineal hygiene, Adjust clothing after toileting Toileting steps completed by helper: Adjust clothing after toileting Toileting Assistive Devices: Grab bar or rail Assist level: Supervision or verbal cues  Function - Air cabin crew transfer assistive device: Grab bar, Cane Assist level to toilet: Supervision or verbal cues Assist level from toilet: Supervision or verbal cues  Function - Chair/bed transfer Chair/bed transfer method: Stand pivot Chair/bed transfer assist level: Touching or steadying assistance (Pt > 75%) Chair/bed transfer assistive device: Armrests  Function - Locomotion: Wheelchair Will patient use wheelchair at discharge?: No Type: Manual Max wheelchair distance: 184f  Assist Level: Touching or steadying assistance (Pt > 75%) Assist Level: Touching or steadying assistance (Pt > 75%) Wheel 150 feet activity did not occur: Refused Turns around,maneuvers to table,bed, and toilet,negotiates 3% grade,maneuvers on rugs and over doorsills: No Function - Locomotion: Ambulation Assistive device: No device Max distance: 1552f Assist level: Touching or steadying assistance (Pt > 75%) Assist level: Touching or steadying assistance (Pt > 75%) Assist level: Touching or steadying assistance (Pt > 75%) Walk 150 feet activity did not occur: Safety/medical concerns Assist level: Touching or steadying assistance (Pt > 75%) Assist level: Touching or steadying assistance (Pt > 75%)  Function - Comprehension Comprehension: Auditory Comprehension assist level: Follows basic conversation/direction with no assist  Function - Expression Expression: Verbal Expression assist level: Expresses basic needs/ideas:  With no assist  Function - Social Interaction Social Interaction assist level: Interacts appropriately with others - No medications needed.  Function - Problem Solving Problem solving assist level: Solves basic problems with no assist  Function - Memory Memory assist level: Recognizes or recalls 90% of the time/requires cueing < 10% of the time Patient normally able to recall (first 3 days only): Current season, Location of own room, That he or she is in a hospital, Staff names and faces  Medical Problem List and Plan: 1. Gait disorder secondary to right subcortical infarct  -continue  PT, OT --progressing toward goals 2. DVT Prophylaxis/Anticoagulation: Coumadin.   -holding coumadin    -F/U INR down to 3.9 today---future dosing per Pharmacy 3. Pain Management: Tylenol as needed 4. Mood: Provide emotional support 5. Neuropsych: This patient iscapable of making decisions on hisown behalf. 6. Skin/Wound Care: Routine skin checks 7. Fluids/Electrolytes/Nutrition: encourage PO 8.Atrial fibrillation. Continue Coumadin therapy. Cardiac rate controlled. Follow-up cardiology services 9.ESRD. Continue hemodialysis as per renal services. HD after therapies 10.Diabetes mellitus of peripheral neuropathy. Hemoglobin A1c 7.0.SSI.Check blood sugars before meals and at bedtime. Patient on Amaryl 4 mg twice a day prior to admission.    -some lability recently but overall improved  -intake is consistent 11.Acute on chronic anemia. Continue Aranesp 12.Hypertension. Coreg 3.125 mg twice a day 13.BPH. Uroxatral 10 mg daily, Proscar 5 mg daily. uroxatral held for urinary retention 14.Hyperlipidemia. Zetia 15. Constipation: augmented regimen  LOS (Days) 6 A FACE TO FACE EVALUATION WAS PERFORMED  James David 02/12/2017, 8:53 AM

## 2017-02-12 NOTE — Progress Notes (Signed)
Assessment/Plan:  1. R subcortical vs brainstem stroke. w/ gait dysfct 2. Gross Hematuria/Urinary retention -Dr. Tresa Moore Urology saw 5/19. Required I/O cath 5./30for 450 cc urine 3. ESRD - TTS. Dialyzed last PM late due to scheduling issues. 4. HD access - R AVF. Required declot/PTA at [facility] 01/28/17 PTA. No issues here. Up to 15 ga needles.  5. H/o A. Fib/ Pacemaker- Warfarin dosing per pharmacy.  6. Orthostatic hypotension/syncope. Neuro is OK with midodrine if needed. BP goal 120-150 per Neuro   Subjective: Interval History: tired from late HD  Objective: Vital signs in last 24 hours: Temp:  [97.3 F (36.3 C)-97.9 F (36.6 C)] 97.3 F (36.3 C) (05/30 0505) Pulse Rate:  [82-97] 94 (05/30 0800) Resp:  [18] 18 (05/30 0505) BP: (106-162)/(52-86) 127/52 (05/30 0800) SpO2:  [99 %-100 %] 100 % (05/30 0505) Weight:  [79.3 kg (174 lb 13.2 oz)-81.3 kg (179 lb 3.7 oz)] 79.3 kg (174 lb 13.2 oz) (05/30 0505) Weight change:   Intake/Output from previous day: 05/29 0701 - 05/30 0700 In: 1680 [P.O.:1680] Out: 1614 [Urine:225] Intake/Output this shift: No intake/output data recorded.  General appearance: alert and cooperative Resp: clear to auscultation bilaterally Cardio: regular rate and rhythm, S1, S2 normal, no murmur, click, rub or gallop Extremities: tr edema  Lab Results:  Recent Labs  02/10/17 0559 02/11/17 2146  WBC 9.8 9.2  HGB 9.7* 9.4*  HCT 29.8* 29.0*  PLT 236 256   BMET:  Recent Labs  02/10/17 0559 02/11/17 2146  NA 135 130*  K 3.5 3.9  CL 95* 98*  CO2 26 20*  GLUCOSE 110* 97  BUN 72* 117*  CREATININE 6.66* 8.55*  CALCIUM 8.7* 9.0   No results for input(s): PTH in the last 72 hours. Iron Studies: No results for input(s): IRON, TIBC, TRANSFERRIN, FERRITIN in the last 72 hours. Studies/Results: No results found.  Scheduled: . aspirin EC  81 mg Oral Daily  . calcium acetate  1,334 mg Oral TID WC  . carvedilol  3.125 mg Oral BID WC  . darbepoetin  (ARANESP) injection - DIALYSIS  60 mcg Intravenous Q Tue-HD  . doxercalciferol  4 mcg Intravenous Q T,Th,Sa-HD  . ezetimibe  10 mg Oral Daily  . feeding supplement (PRO-STAT SUGAR FREE 64)  30 mL Oral BID  . fenofibrate  160 mg Oral Daily  . finasteride  5 mg Oral Daily  . glimepiride  1 mg Oral Q breakfast  . insulin aspart  0-5 Units Subcutaneous QHS  . insulin aspart  0-9 Units Subcutaneous TID WC  . ipratropium  2 spray Each Nare Daily  . multivitamin  1 tablet Oral QHS  . rOPINIRole  0.5 mg Oral QHS  . senna-docusate  2 tablet Oral QHS  . Warfarin - Pharmacist Dosing Inpatient   Does not apply q1800    LOS: 6 days   Lathaniel Legate C 02/12/2017,11:29 AM

## 2017-02-12 NOTE — Progress Notes (Signed)
Social Work Patient ID: James David, male   DOB: 03/23/1936, 81 y.o.   MRN: [id number]  Met with pt and wife to discuss team conference goals mod/i level and target discharge date 6/2. Pt reports he is very fatigued from HD and will Need to rest after on Sat before going home. Wife confirms this and wants to be ready to go home. Will work on discharge needs. Does want a tub bench and aware it is private pay.

## 2017-02-12 NOTE — Progress Notes (Signed)
Occupational Therapy Session Note  Patient Details  Name: James David MRN: [id number] Date of Birth: Mar 21, 1936  Today's Date: 02/12/2017 OT Individual Time: 2248-2500 OT Individual Time Calculation (min): 65 min    Short Term Goals: Week 1:  OT Short Term Goal 1 (Week 1): Pt will maintain standing balance with close supervision while obtaining clothing in prep for dressing  OT Short Term Goal 2 (Week 1): Pt will demonstrate intellecual awareness of dec attention to the left in functional tasks OT Short Term Goal 3 (Week 1): Pt will don LB clothing sit to stand with supervision  OT Short Term Goal 4 (Week 1): Pt will perform 3/3 grooming tasks in standing with supervision   Skilled Therapeutic Interventions/Progress Updates: Upon approach for OT, patient stated he was fatigued from not getting sleep or back from dialysis til ~ 2am.   He concurred to work w/c level and stated he was too fatigued to stand or work on balance.   He completed cane level tub bench shower/tub transfer with superision.    As well, he worked on endurance w/c level on the armitron bike on level 1.4 for 20 minutes with approximately 10 five second rest breaks.     As well this clinician helped patient stretch his lower legs seated in the wheelchair as he stated, "I get spasms."   Later he stated he actually has Restless Leg syndrome  He was left seated in his w/c with lunch and his capable wife supervising his lunch meal,.     Therapy Documentation Precautions:  Precautions Precautions: Fall Restrictions Weight Bearing Restrictions: No    Therapy/Group: Individual Therapy  Herschell Dimes 02/12/2017, 7:13 PM

## 2017-02-12 NOTE — Progress Notes (Signed)
Occupational Therapy Session Note  Patient Details  Name: James David MRN: [id number] Date of Birth: Sep 11, 1936  Today's Date: 02/12/2017 OT Individual Time: 0223-3612 OT Individual Time Calculation (min): 55 min    Short Term Goals: Week 1:  OT Short Term Goal 1 (Week 1): Pt will maintain standing balance with close supervision while obtaining clothing in prep for dressing  OT Short Term Goal 2 (Week 1): Pt will demonstrate intellecual awareness of dec attention to the left in functional tasks OT Short Term Goal 3 (Week 1): Pt will don LB clothing sit to stand with supervision  OT Short Term Goal 4 (Week 1): Pt will perform 3/3 grooming tasks in standing with supervision   Skilled Therapeutic Interventions/Progress Updates:    Pt seen for OT session focusing on ADL re=-training. Pt voicing 9-10/10 pain due to muscle cramps in R UE/LE following dyalysis. Reports having already received pain medicine. He was willing to attempt getting dressed despite pain. HE transferred to EOB using hospital bed functions mod I. He dressed seated EOB with HHA to stand from EOB and pull pants/ underwear up. He ambulated with SPC into bathroom to complete standing toileting tasks with supervision. He returned to EOB to eat breakfast. While eating, discussed d/c planning, pt voiced readiness to d/c. Also discussed DME, therapy goals, continuum of care, HHOT vs OP therapies and scheduling family education with pt's wife. Pt required supported seated rest break following unsupported sitting EOB. Following rest break, he completed grooming task standing at sink. Pt with cont complaints of fatigue and requested to hold off on scheduled PT session directly following OT session. Made PT aware. Pt returned to recliner at end of session, left resting with all needs in reach. RN made aware of pt's pain though pt reported pain had eased following activity/ repositioning.   Therapy Documentation Precautions:   Precautions Precautions: Fall Restrictions Weight Bearing Restrictions: No ADL: ADL ADL Comments: see functional navigator  See Function Navigator for Current Functional Status.   Therapy/Group: Individual Therapy  [name], [name] 02/12/2017, 6:39 AM

## 2017-02-13 ENCOUNTER — Inpatient Hospital Stay (HOSPITAL_COMMUNITY): Payer: Medicare Other | Admitting: Occupational Therapy

## 2017-02-13 ENCOUNTER — Inpatient Hospital Stay (HOSPITAL_COMMUNITY): Payer: Medicare Other | Admitting: Physical Therapy

## 2017-02-13 ENCOUNTER — Inpatient Hospital Stay (HOSPITAL_COMMUNITY): Payer: Medicare Other | Admitting: Speech Pathology

## 2017-02-13 LAB — RENAL FUNCTION PANEL
ALBUMIN: 2.9 g/dL — AB (ref 3.5–5.0)
Anion gap: 14 (ref 5–15)
BUN: 86 mg/dL — ABNORMAL HIGH (ref 6–20)
CO2: 21 mmol/L — ABNORMAL LOW (ref 22–32)
Calcium: 8.8 mg/dL — ABNORMAL LOW (ref 8.9–10.3)
Chloride: 97 mmol/L — ABNORMAL LOW (ref 101–111)
Creatinine, Ser: 7.45 mg/dL — ABNORMAL HIGH (ref 0.61–1.24)
GFR calc Af Amer: 7 mL/min — ABNORMAL LOW (ref >60)
GFR, EST NON AFRICAN AMERICAN: 6 mL/min — AB (ref >60)
GLUCOSE: 119 mg/dL — AB (ref 65–99)
PHOSPHORUS: 7.1 mg/dL — AB (ref 2.5–4.6)
Potassium: 4.1 mmol/L (ref 3.5–5.1)
Sodium: 132 mmol/L — ABNORMAL LOW (ref 135–145)

## 2017-02-13 LAB — CBC
HCT: 29.7 % — ABNORMAL LOW (ref 39.0–52.0)
Hemoglobin: 9.9 g/dL — ABNORMAL LOW (ref 13.0–17.0)
MCH: 30.9 pg (ref 26.0–34.0)
MCHC: 33.3 g/dL (ref 30.0–36.0)
MCV: 92.8 fL (ref 78.0–100.0)
Platelets: 268 10*3/uL (ref 150–400)
RBC: 3.2 MIL/uL — ABNORMAL LOW (ref 4.22–5.81)
RDW: 14.9 % (ref 11.5–15.5)
WBC: 8.9 10*3/uL (ref 4.0–10.5)

## 2017-02-13 LAB — GLUCOSE, CAPILLARY
GLUCOSE-CAPILLARY: 79 mg/dL (ref 65–99)
Glucose-Capillary: 177 mg/dL — ABNORMAL HIGH (ref 65–99)
Glucose-Capillary: 54 mg/dL — ABNORMAL LOW (ref 65–99)
Glucose-Capillary: 128 mg/dL — ABNORMAL HIGH (ref 65–99)

## 2017-02-13 LAB — PROTIME-INR
INR: 3.29
Prothrombin Time: 34.2 seconds — ABNORMAL HIGH (ref 11.4–15.2)

## 2017-02-13 MED ORDER — SODIUM CHLORIDE 0.9 % IV SOLN: 100.0000 mL | INTRAVENOUS | Status: DC | PRN

## 2017-02-13 MED ORDER — LIDOCAINE-PRILOCAINE 2.5-2.5 % EX CREA: 1.0000 "application " | TOPICAL_CREAM | CUTANEOUS | Status: DC | PRN

## 2017-02-13 MED ORDER — LIDOCAINE HCL (PF) 1 % IJ SOLN: 5.0000 mL | INTRAMUSCULAR | Status: DC | PRN

## 2017-02-13 MED ORDER — ALTEPLASE 2 MG IJ SOLR: 2.0000 mg | Freq: Once | INTRAMUSCULAR | Status: DC | PRN

## 2017-02-13 MED ORDER — PENTAFLUOROPROP-TETRAFLUOROETH EX AERO: 1.0000 "application " | INHALATION_SPRAY | CUTANEOUS | Status: DC | PRN

## 2017-02-13 MED ORDER — DOXERCALCIFEROL 4 MCG/2ML IV SOLN
INTRAVENOUS | Status: AC
  Filled 2017-02-13: qty 2

## 2017-02-13 MED ORDER — WARFARIN 0.5 MG HALF TABLET
0.5000 mg | ORAL_TABLET | Freq: Once | ORAL | Status: AC
  Administered 2017-02-13: 0.5 mg via ORAL
  Filled 2017-02-13: qty 1

## 2017-02-13 MED ORDER — HEPARIN SODIUM (PORCINE) 1000 UNIT/ML DIALYSIS: 20.0000 [IU]/kg | INTRAMUSCULAR | Status: DC | PRN

## 2017-02-13 MED ORDER — HEPARIN SODIUM (PORCINE) 1000 UNIT/ML DIALYSIS: 1000.0000 [IU] | INTRAMUSCULAR | Status: DC | PRN

## 2017-02-13 NOTE — Progress Notes (Signed)
ANTICOAGULATION CONSULT NOTE - Follow Up Consult  Pharmacy Consult for coumadin Indication: atrial fibrillation in setting of acute stroke/TIA    Allergies  Allergen Reactions  . Amlodipine Besylate Swelling    angioedema  . Lisinopril Other (See Comments)    Renal insufficiency  . Statins Other (See Comments)    Muscle aches   Patient Measurements: Height: 5\' 10"  (177.8 cm) Weight: 174 lb 13.2 oz (79.3 kg) IBW/kg (Calculated) : 73  Assessment: 89 yoM with atrial fibrillation in setting of acute stroke/TIA on warfarin. INR labile and appears to be very sensitive. INR remains supratherapeutic but down to 3.29 from 3.91. Hgb 9.4, plts wnl on 5/29. Last warfarin dose was 2.5mg  received on 5/27  Goal of Therapy:  INR 2-3 Monitor platelets by anticoagulation protocol: Yes   Plan:  Coumadin 0.5mg  po x 1 Monitor daily INR, CBC, s/s of bleed  Maryanna Shape, PharmD, BCPS  Clinical Pharmacist  Pager: [phone]   02/13/2017 1:04 PM

## 2017-02-13 NOTE — Progress Notes (Signed)
Occupational Therapy Session Note  Patient Details  Name: James David MRN: [id number] Date of Birth: 11/04/1935  Today's Date: 02/13/2017 OT Individual Time: 1015-1130 OT Individual Time Calculation (min): 75 min    Short Term Goals: Week 1:  OT Short Term Goal 1 (Week 1): Pt will maintain standing balance with close supervision while obtaining clothing in prep for dressing  OT Short Term Goal 2 (Week 1): Pt will demonstrate intellecual awareness of dec attention to the left in functional tasks OT Short Term Goal 3 (Week 1): Pt will don LB clothing sit to stand with supervision  OT Short Term Goal 4 (Week 1): Pt will perform 3/3 grooming tasks in standing with supervision       Skilled Therapeutic Interventions/Progress Updates:    Pt seen this session for ADL retraining with a focus on dynamic balance and endurance. He was able to accomplish all self care at a supervision/ mod I level in a time efficient manner using his SPC.  Pt taken to gym to work on standing balance (pt requested to focus on this).  Pt worked on 10 reps on each leg at the parallel bars of lateral stepping, knee raises, hamstring curls, heel raises.  He needed to rest after each set for a few minutes, but was able to complete 4 sets total.  Worked on balance with L ankle strategy of wt shifting. He has limited L ankle dorsiflexion.  Demonstrated with return demonstration of how he can use handle of the cane to give him assisted heel stretches.  Pt taken to SLP therapy for his next session.  Therapy Documentation Precautions:  Precautions Precautions: Fall Restrictions Weight Bearing Restrictions: No   Pain: Pain Assessment Pain Assessment: No/denies pain ADL: ADL ADL Comments: see functional navigator  See Function Navigator for Current Functional Status.   Therapy/Group: Individual Therapy  [place] 02/13/2017, 12:33 PM

## 2017-02-13 NOTE — Progress Notes (Signed)
Occupational Therapy Session Note  Patient Details  Name: James David MRN: [id number] Date of Birth: 1936-07-08  Today's Date: 02/13/2017 OT Individual Time: [phone] (make up time) OT Individual Time Calculation (min): 39 min    Short Term Goals: Week 1:  OT Short Term Goal 1 (Week 1): Pt will maintain standing balance with close supervision while obtaining clothing in prep for dressing  OT Short Term Goal 2 (Week 1): Pt will demonstrate intellecual awareness of dec attention to the left in functional tasks OT Short Term Goal 3 (Week 1): Pt will don LB clothing sit to stand with supervision  OT Short Term Goal 4 (Week 1): Pt will perform 3/3 grooming tasks in standing with supervision   Skilled Therapeutic Interventions/Progress Updates:    Treatment session with focus on functional mobility and dynamic balance with and without UE support.  Engaged in balance activity on Biodex with focus on weight shift to Lt.  Pt with increased difficulty with weight shift to Lt.  Tactile cues at hips to facilitate improved weight shift.  During 2nd trial pt with improved smoothness of movement, still with increased difficulty to Lt.  One attempt without UE support with increased time for weight shift.  Engaged in reaching activity to further facilitate weight shift with simulated placing of items into cabinet, requiring pt to reach outside BOS both reaching up and bending down.  Ambulated back to room with SPC approx 150 feet with supervision.  Completed toileting in standing with supervision, transferred back to bed and left with transport for HD.  Therapy Documentation Precautions:  Precautions Precautions: Fall Restrictions Weight Bearing Restrictions: No Pain: Pain Assessment Pain Assessment: No/denies pain  See Function Navigator for Current Functional Status.   Therapy/Group: Individual Therapy  Simonne Come 02/13/2017, 3:04 PM

## 2017-02-13 NOTE — Progress Notes (Signed)
Speech Language Pathology Daily Session Note  Patient Details  Name: James David MRN: [id number] Date of Birth: Oct 02, 1935  Today's Date: 02/13/2017 SLP Individual Time: 1130-1200 SLP Individual Time Calculation (min): 30 min  Short Term Goals: Week 1: SLP Short Term Goal 1 (Week 1): short term goals=long term goals  Skilled Therapeutic Interventions: skilled treatment session focused on cognition goals. SLP facilitated session by providing Mod A semi-complex problem solving including for simple medication management task. Pt with difficulty maintaining task and consistently placing pills in each day. Pt was returned to room, left upright in his wheelchair, safety belt donned and all needs within reach. Continue per current plan of care.      Function:    Cognition Comprehension Comprehension assist level: Follows basic conversation/direction with no assist  Expression   Expression assist level: Expresses basic needs/ideas: With no assist  Social Interaction Social Interaction assist level: Interacts appropriately with others - No medications needed.  Problem Solving Problem solving assist level: Solves basic problems with no assist  Memory Memory assist level: Recognizes or recalls 90% of the time/requires cueing < 10% of the time    Pain Pain Assessment Pain Assessment: No/denies pain  Therapy/Group: Individual Therapy  Caroleen Stoermer 02/13/2017, 12:42 PM

## 2017-02-13 NOTE — Procedures (Signed)
Tolerating HD, goal 1500cc, BP ok Will see how he does post treatment Stewart Sasaki C

## 2017-02-13 NOTE — Progress Notes (Signed)
Subjective/Complaints: Had a fair night. Slept a little better. Up walking with PT in therapy. No new complaints  ROS: pt denies nausea, vomiting, diarrhea, cough, shortness of breath or chest pain  Objective: Vital Signs: Blood pressure 138/63, pulse 93, temperature 98.9 F (37.2 C), temperature source Oral, resp. rate 16, height _0  (1.778 m), weight 79.3 kg (174 lb 13.2 oz), SpO2 99 %. No results found. Results for orders placed or performed during the hospital encounter of 02/06/17 (from the past 72 hour(s))  Protime-INR     Status: Abnormal   Collection Time: 02/10/17 10:33 AM  Result Value Ref Range   Prothrombin Time 45.7 (H) 11.4 - 15.2 seconds   INR 4.72 (HH)     Comment: REPEATED TO VERIFY CRITICAL RESULT CALLED TO, READ BACK BY AND VERIFIED WITH: DENISE LLOYD,RN AT 1157 02/10/17 BY ZBEECH.   Glucose, capillary     Status: Abnormal   Collection Time: 02/10/17 12:17 PM  Result Value Ref Range   Glucose-Capillary 231 (H) 65 - 99 mg/dL  Glucose, capillary     Status: Abnormal   Collection Time: 02/10/17  4:26 PM  Result Value Ref Range   Glucose-Capillary 183 (H) 65 - 99 mg/dL  Glucose, capillary     Status: Abnormal   Collection Time: 02/10/17  9:07 PM  Result Value Ref Range   Glucose-Capillary 153 (H) 65 - 99 mg/dL  Glucose, capillary     Status: Abnormal   Collection Time: 02/11/17  6:22 AM  Result Value Ref Range   Glucose-Capillary 59 (L) 65 - 99 mg/dL  Glucose, capillary     Status: Abnormal   Collection Time: 02/11/17  7:27 AM  Result Value Ref Range   Glucose-Capillary 128 (H) 65 - 99 mg/dL  Glucose, capillary     Status: Abnormal   Collection Time: 02/11/17 12:11 PM  Result Value Ref Range   Glucose-Capillary 203 (H) 65 - 99 mg/dL   Comment 1 Notify RN   Protime-INR     Status: Abnormal   Collection Time: 02/11/17  2:57 PM  Result Value Ref Range   Prothrombin Time 43.8 (H) 11.4 - 15.2 seconds   INR 4.48 (HH)     Comment: REPEATED TO  VERIFY CRITICAL RESULT CALLED TO, READ BACK BY AND VERIFIED WITH: J.HICKS,RN 02/11/17 _1  BY V.WILKINS   Glucose, capillary     Status: Abnormal   Collection Time: 02/11/17  4:20 PM  Result Value Ref Range   Glucose-Capillary 195 (H) 65 - 99 mg/dL   Comment 1 Notify RN   Glucose, capillary     Status: Abnormal   Collection Time: 02/11/17  8:58 PM  Result Value Ref Range   Glucose-Capillary 103 (H) 65 - 99 mg/dL  Renal function panel     Status: Abnormal   Collection Time: 02/11/17  9:46 PM  Result Value Ref Range   Sodium 130 (L) 135 - 145 mmol/L   Potassium 3.9 3.5 - 5.1 mmol/L   Chloride 98 (L) 101 - 111 mmol/L   CO2 20 (L) 22 - 32 mmol/L   Glucose, Bld 97 65 - 99 mg/dL   BUN 117 (H) 6 - 20 mg/dL   Creatinine, Ser 8.55 (H) 0.61 - 1.24 mg/dL   Calcium 9.0 8.9 - 10.3 mg/dL   Phosphorus 7.3 (H) 2.5 - 4.6 mg/dL   Albumin 2.8 (L) 3.5 - 5.0 g/dL   GFR calc non Af Amer 5 (L) >60 mL/min   GFR calc Af Amer 6 (L) >  60 mL/min    Comment: (NOTE) The eGFR has been calculated using the CKD EPI equation. This calculation has not been validated in all clinical situations. eGFR's persistently <60 mL/min signify possible Chronic Kidney Disease.    Anion gap 12 5 - 15  CBC     Status: Abnormal   Collection Time: 02/11/17  9:46 PM  Result Value Ref Range   WBC 9.2 4.0 - 10.5 K/uL   RBC 3.15 (L) 4.22 - 5.81 MIL/uL   Hemoglobin 9.4 (L) 13.0 - 17.0 g/dL   HCT 29.0 (L) 39.0 - 52.0 %   MCV 92.1 78.0 - 100.0 fL   MCH 29.8 26.0 - 34.0 pg   MCHC 32.4 30.0 - 36.0 g/dL   RDW 14.8 11.5 - 15.5 %   Platelets 256 150 - 400 K/uL  Glucose, capillary     Status: Abnormal   Collection Time: 02/12/17  6:28 AM  Result Value Ref Range   Glucose-Capillary 108 (H) 65 - 99 mg/dL  Protime-INR     Status: Abnormal   Collection Time: 02/12/17  6:36 AM  Result Value Ref Range   Prothrombin Time 39.3 (H) 11.4 - 15.2 seconds   INR 3.91   Glucose, capillary     Status: Abnormal   Collection Time: 02/12/17  11:21 AM  Result Value Ref Range   Glucose-Capillary 163 (H) 65 - 99 mg/dL   Comment 1 Notify RN   Glucose, capillary     Status: Abnormal   Collection Time: 02/12/17  4:16 PM  Result Value Ref Range   Glucose-Capillary 133 (H) 65 - 99 mg/dL   Comment 1 Notify RN   Glucose, capillary     Status: Abnormal   Collection Time: 02/12/17  9:39 PM  Result Value Ref Range   Glucose-Capillary 120 (H) 65 - 99 mg/dL  Protime-INR     Status: Abnormal   Collection Time: 02/13/17  5:26 AM  Result Value Ref Range   Prothrombin Time 34.2 (H) 11.4 - 15.2 seconds   INR 3.29   Glucose, capillary     Status: None   Collection Time: 02/13/17  6:36 AM  Result Value Ref Range   Glucose-Capillary 79 65 - 99 mg/dL     HEENT: PERRL Caridac:  RRR CHest: CTA B Extremity:  Pulses positive and No Edema Skin:   Bruising on both arms persistent Neuro: Alert/Oriented, Normal Sensory and Abnormal Motor 4 to 4+/5, bilateral deltoid by stress, grip, hip flexor and extensor, ankle dorsiflexor--good sitting balance Musc/Skel:  Other No pain with upper extremity or lower extremity range of motion Gen. no acute distress but appears fatigued   Assessment/Plan: 1. Functional deficits secondary to Right subcortical infarct which require 3+ hours per day of interdisciplinary therapy in a comprehensive inpatient rehab setting. Physiatrist is providing close team supervision and 24 hour management of active medical problems listed below. Physiatrist and rehab team continue to assess barriers to discharge/monitor patient progress toward functional and medical goals. FIM: Function - Bathing Position: Shower Body parts bathed by patient: Right arm, Right upper leg, Left arm, Left upper leg, Abdomen, Chest, Right lower leg, Front perineal area, Left lower leg, Buttocks, Back Body parts bathed by helper: Buttocks, Right lower leg, Left lower leg, Back Assist Level: Supervision or verbal cues  Function- Upper Body  Dressing/Undressing What is the patient wearing?: Pull over shirt/dress Pull over shirt/dress - Perfomed by patient: Thread/unthread right sleeve, Thread/unthread left sleeve, Put head through opening, Pull shirt over trunk Pull  over shirt/dress - Perfomed by helper: Pull shirt over trunk Assist Level: Set up Set up : To obtain clothing/put away Function - Lower Body Dressing/Undressing What is the patient wearing?: Pants, [employer], Shoes, Underwear Position: Sitting EOB Underwear - Performed by patient: Thread/unthread right underwear leg, Thread/unthread left underwear leg, Pull underwear up/down Pants- Performed by patient: Thread/unthread right pants leg, Thread/unthread left pants leg, Pull pants up/down, Fasten/unfasten pants Shoes - Performed by patient: Don/doff right shoe, Don/doff left shoe Shoes - Performed by helper: Don/doff right shoe, Don/doff left shoe TED Hose - Performed by helper: Don/doff right TED hose, Don/doff left TED hose Assist for footwear: Setup Assist for lower body dressing: Touching or steadying assistance (Pt > 75%)  Function - Toileting Toileting activity did not occur: No continent bowel/bladder event Toileting steps completed by patient: Adjust clothing prior to toileting, Performs perineal hygiene, Adjust clothing after toileting Toileting steps completed by helper: Adjust clothing after toileting Toileting Assistive Devices: Grab bar or rail Assist level: Supervision or verbal cues  Function Midwife transfer assistive device: Grab bar, Cane Assist level to toilet: Supervision or verbal cues Assist level from toilet: Supervision or verbal cues  Function - Chair/bed transfer Chair/bed transfer method: Stand pivot Chair/bed transfer assist level: Supervision or verbal cues Chair/bed transfer assistive device: Armrests  Function - Locomotion: Wheelchair Will patient use wheelchair at discharge?: No Type: Manual Max wheelchair  distance: 116f  Assist Level: Touching or steadying assistance (Pt > 75%) Assist Level: Touching or steadying assistance (Pt > 75%) Wheel 150 feet activity did not occur: Refused Turns around,maneuvers to table,bed, and toilet,negotiates 3% grade,maneuvers on rugs and over doorsills: No Function - Locomotion: Ambulation Assistive device: Cane-straight Max distance: 160 Assist level: Supervision or verbal cues Assist level: Supervision or verbal cues Assist level: Supervision or verbal cues Walk 150 feet activity did not occur: Safety/medical concerns Assist level: Supervision or verbal cues Assist level: Touching or steadying assistance (Pt > 75%)  Function - Comprehension Comprehension: Auditory Comprehension assist level: Follows basic conversation/direction with no assist  Function - Expression Expression: Verbal Expression assist level: Expresses basic needs/ideas: With no assist  Function - Social Interaction Social Interaction assist level: Interacts appropriately with others - No medications needed.  Function - Problem Solving Problem solving assist level: Solves basic problems with no assist  Function - Memory Memory assist level: Recognizes or recalls 90% of the time/requires cueing < 10% of the time Patient normally able to recall (first 3 days only): Current season, Location of own room, That he or she is in a hospital, Staff names and faces  Medical Problem List and Plan: 1. Gait disorder secondary to right subcortical infarct  -continue  PT, OT  2. DVT Prophylaxis/Anticoagulation: .   -holding coumadin    -F/U INR down to 3.29 today---future dosing per Pharmacy 3. Pain Management: Tylenol as needed 4. Mood: Provide emotional support 5. Neuropsych: This patient iscapable of making decisions on hisown behalf. 6. Skin/Wound Care: Routine skin checks 7. Fluids/Electrolytes/Nutrition: encourage PO 8.Atrial fibrillation. Continue Coumadin therapy. Cardiac  rate controlled. Follow-up cardiology services 9.ESRD. Continue hemodialysis as per renal services. HD after therapies 10.Diabetes mellitus of peripheral neuropathy. Hemoglobin A1c 7.0.SSI.Check blood sugars before meals and at bedtime. Patient on Amaryl 4 mg twice a day prior to admission.    -some lability recently but overall improved  -intake is consistent  -on amaryl 196mQD currently 11.Acute on chronic anemia. Continue Aranesp 12.Hypertension. Coreg 3.125 mg twice a day  13.BPH. Uroxatral 10 mg daily, Proscar 5 mg daily. uroxatral held for urinary retention 14.Hyperlipidemia. Zetia 15. Constipation: augmented regimen  LOS (Days) 7 A FACE TO FACE EVALUATION WAS PERFORMED  Paislyn Domenico T 02/13/2017, 9:04 AM

## 2017-02-13 NOTE — Progress Notes (Signed)
Physical Therapy Session Note  Patient Details  Name: James David MRN: [id number] Date of Birth: Feb 07, 1936  Today's Date: 02/13/2017 PT Individual Time: 7005-2591 AND 1301-1330 PT Individual Time Calculation (min): 74 min AND 28 min  Short Term Goals: Week 1:  PT Short Term Goal 1 (Week 1): STG=LTG due to estimated length of stay   Skilled Therapeutic Interventions/Progress Updates:     Session 1 Pt received sitting EOB and agreeable to PT.   Gait in room with SPC to bathroom for urination. Distant supervision assist from PT for safety spacial awareness of improved success.   PT instructed pt in lower body dressing with supervision assist for pants and shoe management. Sit<>stand to pull pants to waist with supervision assist and no AD.   PT transported pt to main entrance of hospital in [facility]. Gait training instructed by PT on unlevel cement sidewalk  2 x140f with supervision assist. One near LOB, pt able to self correct with stepping strategy. Gait training also performed on unlevel grass surface as well as up/down 6 inch curb. Supervision assist from PT with min cues for gait pattern and AD management.   WC mobility x 1539fwith supervision assist. PT provided  Supervision assist for cues to imrpoved ROM on push with LUE.   PT instructed pt in dynamic balance training reciprocal foot taps on 6 inch step with min assist from PT. Lateral foot taps on 6 inch step with min assist from PT.  Modified [place] balance exercises level B min-supervision assist from PT with UE support on rail in hall.   Patient returned too room and [facility] reach and all needs met.  Session 2. Pt received sitting in WC and agreeable to PT Gait training performed in room to bathroom and sink with distant supervision assist and SPC. PT also instructed pt in gait through rehab unit x 15061fnd SPC with min cues for improved foot clearance and increased step length.  Stand pivot  transfer to Nustep with distant supervision assist from PT.   nustep reciprocal movement training and cardiovascular endurance training x 10 minutes level 4>5 with min cues for proper speed. Stand pivot transfer with distant supervision assist [facility] and [facility] in reach and all needs met.         Therapy Documentation Precautions:  Precautions Precautions: Fall Restrictions Weight Bearing Restrictions: No    Vital Signs: Therapy Vitals Temp: 98.9 F (37.2 C) Temp Source: Oral Pulse Rate: 93 Resp: 16 BP: 138/63 Patient Position (if appropriate): Lying Oxygen Therapy SpO2: 99 % O2 Device: Not Delivered Pain:   0/10  See Function Navigator for Current Functional Status.   Therapy/Group: Individual Therapy  AusLorie Phenix31/2018, 9:16 AM

## 2017-02-14 ENCOUNTER — Inpatient Hospital Stay (HOSPITAL_COMMUNITY): Payer: Medicare Other | Admitting: Occupational Therapy

## 2017-02-14 ENCOUNTER — Inpatient Hospital Stay (HOSPITAL_COMMUNITY): Payer: Medicare Other | Admitting: Speech Pathology

## 2017-02-14 ENCOUNTER — Inpatient Hospital Stay (HOSPITAL_COMMUNITY): Payer: Medicare Other | Admitting: Physical Therapy

## 2017-02-14 LAB — GLUCOSE, CAPILLARY
GLUCOSE-CAPILLARY: 132 mg/dL — AB (ref 65–99)
GLUCOSE-CAPILLARY: 177 mg/dL — AB (ref 65–99)
Glucose-Capillary: 102 mg/dL — ABNORMAL HIGH (ref 65–99)
Glucose-Capillary: 173 mg/dL — ABNORMAL HIGH (ref 65–99)

## 2017-02-14 LAB — PROTIME-INR
INR: 2.49
INR: 2.46
PROTHROMBIN TIME: 27.1 s — AB (ref 11.4–15.2)
Prothrombin Time: 27.3 seconds — ABNORMAL HIGH (ref 11.4–15.2)

## 2017-02-14 MED ORDER — EZETIMIBE 10 MG PO TABS: 10.0000 mg | ORAL_TABLET | Freq: Every day | ORAL | 1 refills | Status: DC

## 2017-02-14 MED ORDER — ROPINIROLE HCL 0.5 MG PO TABS: 0.5000 mg | ORAL_TABLET | Freq: Every day | ORAL | 1 refills | Status: DC

## 2017-02-14 MED ORDER — CALCIUM ACETATE (PHOS BINDER) 667 MG PO CAPS: 1334.0000 mg | ORAL_CAPSULE | Freq: Three times a day (TID) | ORAL | 0 refills | Status: DC

## 2017-02-14 MED ORDER — RENA-VITE PO TABS: 1.0000 | ORAL_TABLET | Freq: Every day | ORAL | 0 refills | Status: DC

## 2017-02-14 MED ORDER — WARFARIN SODIUM 1 MG PO TABS: ORAL_TABLET | ORAL | 1 refills | Status: DC

## 2017-02-14 MED ORDER — WARFARIN SODIUM 1 MG PO TABS
1.0000 mg | ORAL_TABLET | Freq: Every day | ORAL | Status: DC
  Administered 2017-02-14: 1 mg via ORAL
  Filled 2017-02-14 (×2): qty 1

## 2017-02-14 MED ORDER — FINASTERIDE 5 MG PO TABS: 5.0000 mg | ORAL_TABLET | Freq: Every day | ORAL | 1 refills | Status: DC

## 2017-02-14 MED ORDER — CARVEDILOL 3.125 MG PO TABS: 3.1250 mg | ORAL_TABLET | Freq: Two times a day (BID) | ORAL | 1 refills | Status: DC

## 2017-02-14 MED ORDER — GLIMEPIRIDE 1 MG PO TABS: 1.0000 mg | ORAL_TABLET | Freq: Every day | ORAL | 1 refills | Status: AC

## 2017-02-14 MED ORDER — SENNOSIDES-DOCUSATE SODIUM 8.6-50 MG PO TABS: 2.0000 | ORAL_TABLET | Freq: Every day | ORAL | Status: DC

## 2017-02-14 MED ORDER — FENOFIBRATE 160 MG PO TABS: 160.0000 mg | ORAL_TABLET | Freq: Every day | ORAL | 0 refills | Status: DC

## 2017-02-14 MED ORDER — GABAPENTIN 100 MG PO CAPS: 100.0000 mg | ORAL_CAPSULE | Freq: Every day | ORAL | 1 refills | Status: DC

## 2017-02-14 NOTE — Discharge Instructions (Signed)
Inpatient Rehab Discharge Instructions  James David Discharge date and time: No discharge date for patient encounter.   Activities/Precautions/ Functional Status: Activity: activity as tolerated Diet: renal diet Wound Care: none needed Functional status:  ___ No restrictions     ___ Walk up steps independently ___ 24/7 supervision/assistance   ___ Walk up steps with assistance ___ Intermittent supervision/assistance  ___ Bathe/dress independently ___ Walk with walker     _x__ Bathe/dress with assistance ___ Walk Independently    ___ Shower independently ___ Walk with assistance    ___ Shower with assistance ___ No alcohol     ___ Return to work/school ________  Special Instructions: Continue dialysis as directed  Home health nurse to check INRs 02/19/2017 results to [name]:    Home Health:   PT, OT RN   [name]   Date of last service:02/14/2017  Medical Equipment/Items Ordered:TUB BENCH  Agency/Supplier:[facility]   [phone]   GENERAL COMMUNITY RESOURCES FOR PATIENT/FAMILY: Support Groups:[facility] SUPPORT GROUP EVERY SECOND Thursday ( SEPT-MAY) @ 3:00-4:00 PM ON THE REHAB UNIT QUESTIONS CONTACT [place] [phone]  STROKE/TIA DISCHARGE INSTRUCTIONS SMOKING Cigarette smoking nearly doubles your risk of having a stroke & is the single most alterable risk factor  If you smoke or have smoked in the last 12 months, you are advised to quit smoking for your health.  Most of the excess cardiovascular risk related to smoking disappears within a year of stopping.  Ask you doctor about anti-smoking medications  [name] Quit Line: 1-800-QUIT NOW  Free Smoking Cessation Classes [phone]  CHOLESTEROL Know your levels; limit fat & cholesterol in your diet  Lipid Panel     Component Value Date/Time   CHOL 176 01/30/2017 0424   TRIG 242 (H) 01/30/2017 0424   HDL 29 (L) 01/30/2017 0424     CHOLHDL 6.1 01/30/2017 0424   VLDL 48 (H) 01/30/2017 0424   [name] 99 01/30/2017 0424      Many patients benefit from treatment even if their cholesterol is at goal.  Goal: Total Cholesterol (CHOL) less than 160  Goal:  Triglycerides (TRIG) less than 150  Goal:  HDL greater than 40  Goal:  LDL (LDLCALC) less than 100   BLOOD PRESSURE American Stroke Association blood pressure target is less that 120/80 mm/Hg  Your discharge blood pressure is:  BP: (!) 137/55  Monitor your blood pressure  Limit your salt and alcohol intake  Many individuals will require more than one medication for high blood pressure  DIABETES (A1c is a blood sugar average for last 3 months) Goal HGBA1c is under 7% (HBGA1c is blood sugar average for last 3 months)  Diabetes:   Lab Results  Component Value Date   HGBA1C 7.0 (H) 01/30/2017     Your HGBA1c can be lowered with medications, healthy diet, and exercise.  Check your blood sugar as directed by your physician  Call your physician if you experience unexplained or low blood sugars.  PHYSICAL ACTIVITY/REHABILITATION Goal is 30 minutes at least 4 days per week  Activity: Increase activity slowly, Therapies: Physical Therapy: Home Health Return to work:   Activity decreases your risk of heart attack and stroke and makes your heart stronger.  It helps control your weight and blood pressure; helps you relax and can improve your mood.  Participate in a regular exercise program.  Talk with your doctor about the best form of exercise for you (dancing, walking,  swimming, cycling).  DIET/WEIGHT Goal is to maintain a healthy weight  Your discharge diet is: Diet renal/carb modified with fluid restriction Diet-HS Snack? Nothing; Room service appropriate? Yes; Fluid consistency: Thin  liquids Your height is:  Height: 5\' 10"  (177.8 cm) Your current weight is: Weight: 79.3 kg (174 lb 13.2 oz) Your Body Mass Index (BMI) is:  BMI (Calculated): 26.3   Following the type of diet specifically designed for you will help prevent another stroke.  Your goal weight range is:    Your goal Body Mass Index (BMI) is 19-24.  Healthy food habits can help reduce 3 risk factors for stroke:  High cholesterol, hypertension, and excess weight.  RESOURCES Stroke/Support Group:  Call [phone]   STROKE EDUCATION PROVIDED/REVIEWED AND GIVEN TO PATIENT Stroke warning signs and symptoms How to activate emergency medical system (call 911). Medications prescribed at discharge. Need for follow-up after discharge. Personal risk factors for stroke. Pneumonia vaccine given:  Flu vaccine given:  My questions have been answered, the writing is legible, and I understand these instructions.  I will adhere to these goals & educational materials that have been provided to me after my discharge from the hospital.   are [phone] fax number [phone]   My questions have been answered and I understand these instructions. I will adhere to these goals and the provided educational materials after my discharge from the hospital.  Patient/Caregiver Signature _______________________________ Date __________  Clinician Signature _______________________________________ Date __________  Please bring this form and your medication list with you to all your follow-up doctor's appointments.

## 2017-02-14 NOTE — Discharge Summary (Signed)
NAMEBENJIMAN, James David NO.:  [id number]  MEDICAL RECORD NO.:  [id number]  LOCATION:                                 FACILITY:  PHYSICIAN:  Charlett Blake, M.D.DATE OF BIRTH:  Feb 08, 1936  DATE OF ADMISSION:  02/06/2017 DATE OF DISCHARGE:  02/15/2017                              DISCHARGE SUMMARY   DISCHARGE DIAGNOSES: 1. Right subcortical infarction. 2. Chronic Coumadin therapy for atrial fibrillation. 3. End-stage renal disease, on hemodialysis. 4. Diabetes mellitus. 5. Peripheral neuropathy. 6. Acute on chronic anemia. 7. Hypertension. 8. Benign prostatic hyperplasia. 9. Hyperlipidemia. 10.Constipation.  HISTORY OF PRESENT ILLNESS:  This is a 81 year old, right-handed male, history of diabetes mellitus, atrial fibrillation with pacemaker, maintained on aspirin therapy, end-stage renal disease with hemodialysis.  Lives with spouse independent prior to admission.  One- level home.  Presented Jan 29, 2017, with dizziness, facial droop, slurred speech, left-sided weakness.  Cranial CT scan showed evidence of chronic small vessel disease, but no acute evolving changes. Echocardiogram with ejection fraction of 68%, grade 1 diastolic dysfunction.  The patient did not receive tPA.  Carotid Doppler showed no ICA stenosis.  Neurology consulted.  Suspect acute ischemic infarct. Maintained on Plavix for CVA prophylaxis.  Later changed to Coumadin in light of history of atrial fibrillation with followup per Cardiology Services.  The patient continued on dialysis as advised.  Physical and occupational therapy ongoing.  The patient was admitted for comprehensive rehab program.  PAST MEDICAL HISTORY:  See discharge diagnoses.  SOCIAL HISTORY:  Lives with spouse, independent prior to admission. Functional status upon admission to rehab services; minimal assist, 150 feet rolling walker; max assist, stand pivot transfers; min-to-mod assist, activities of daily  living.  PHYSICAL EXAMINATION:  VITAL SIGNS:  Blood pressure 125/90, pulse 108, temperature 97, respirations 21. GENERAL:  This was an alert male, in no acute distress.  Speech was dysarthric.  Followed commands.  Cognition appeared appropriate. HEENT:  Facial droop.  EOMs intact. NECK:  Supple.  Nontender.  No JVD. CARDIAC:  Irregular. ABDOMEN:  Soft, nontender.  Good bowel sounds. LUNGS:  Clear to auscultation without wheeze.  REHABILITATION HOSPITAL COURSE:  The patient was admitted to Inpatient Rehab Services with therapies initiated on a 3-hour daily basis, consisting of physical therapy, occupational therapy, speech therapy, and rehabilitation nursing.  The following issues were addressed during the patient's rehabilitation stay.  Pertaining to Mr. Halpin' right subcortical infarction, remained stable.  He continued on low-dose aspirin, chronic Coumadin therapy.  He would follow up with Neurology Services.  Heart rate remained controlled.  Blood pressures monitored. He would follow up with Cardiology Services.  Hemodialysis ongoing as per Renal Services.  He did have a history of diabetes mellitus, peripheral neuropathy.  Hemoglobin A1c of 7.0.  He continued on low-dose Amaryl.  Bouts of constipation resolved with laxative assistance.  The patient received weekly collaborative interdisciplinary team conferences to discuss estimated length of stay, family teaching, any barriers to his discharge.  He was ambulating 150 feet x2, supervision, assistive device.  He was able to correct any loss of balance.  Performed ambulation on unlevel grass surfaces as well as up and down stairs,  supervision.  He could gather his belongings for activities of daily living and homemaking.  Engaged in all balance activities.  It was discussed at length no driving at this time.  He completed toileting, standing, supervision.  Transferred back to his bed with supervision. He was able to communicate  full needs with followup per Speech Therapy. Tolerating a regular diet.  Full family teaching was completed and plan discharge home.  DISCHARGE MEDICATIONS: 1. Aspirin 81 mg p.o. daily. 2. PhosLo 1334 mg t.i.d. 3. Coreg 3.125 mg p.o. b.i.d. 4. Zetia 10 mg p.o. daily. 5. Fenofibrate 160 mg p.o. daily. 6. Proscar 5 mg p.o. daily. 7. Neurontin 100 mg at bedtime.  Amaryl 1 mg breakfast. 8. Atrovent nasal spray, 2 sprays each nostril daily. 9. Rena-Vite 1 tab at bedtime. 10.Requip 0.5 mg at bedtime. 11.Senokot-S 2 tablets at bedtime, hold for loose stool. 12.Coumadin therapy, latest dose of 0.5 mg adjusted accordingly for     INR of 2.00 to 3.00.  DIET:  His diet was a renal diabetic diet.  SPECIAL INSTRUCTIONS:  Home health nurse to check INR on February 19, 2017, results to [facility] at [phone], fax [phone].  The patient would follow up with Dr. Alysia Penna, at the [facility] as directed; Dr. Erlinda Hong, Neurology Service, call for appointment; Dr. [name], ongoing Renal Services; Dr. Daneen Schick, Cardiology Services; Dr. Lona Kettle, medical management.     Lauraine Rinne, P.A.   ______________________________ Charlett Blake, M.D.    DA/MEDQ  D:  02/14/2017  T:  02/14/2017  Job:  [id number]  cc:   Rosalin Hawking, MD Elzie Rings Lorrene Reid, M.D. Belva Crome, M.D. Lona Kettle, MD Charlett Blake, M.D.

## 2017-02-14 NOTE — Progress Notes (Signed)
Social Work  Discharge Note  The overall goal for the admission was met for:   Discharge location: Yes-HOME WITH WIFE WHO CAN PROVIDE SUPERVISION LEVEL  Length of Stay: Yes-8 DAYS  Discharge activity level: Yes-SUPERVISION LEVEL  Home/community participation: Yes  Services provided included: MD, RD, PT, OT, SLP, RN, CM, Pharmacy and SW  Financial Services: Private Insurance: [facility]  Follow-up services arranged: Home Health: [facility]-PT,OT,RN, DME: [facility] and Patient/Family has no preference for HH/DME agencies  Comments (or additional information):WIFE Hoover FEELS COMFORTABLE WITH HIS CARE NEEDS. BOTH FEEL PREPARED FOR DISCHARGE TODAY.  Patient/Family verbalized understanding of follow-up arrangements: Yes  Individual responsible for coordination of the follow-up plan: SELF & [name]-WIFE  Confirmed correct DME delivered: Elease Hashimoto 02/14/2017    Elease Hashimoto

## 2017-02-14 NOTE — Progress Notes (Signed)
Subjective: Interval History: has no complaint, wants to go home.  Objective: Vital signs in last 24 hours: Temp:  [98 F (36.7 C)-98.3 F (36.8 C)] 98.3 F (36.8 C) (06/01 0454) Pulse Rate:  [84-105] 93 (06/01 0454) Resp:  [18] 18 (06/01 0454) BP: (121-160)/(52-83) 138/59 (06/01 0454) SpO2:  [97 %-99 %] 97 % (06/01 0454) Weight:  [79.7 kg (175 lb 11.3 oz)-80.7 kg (177 lb 14.6 oz)] 79.7 kg (175 lb 11.3 oz) (05/31 1919) Weight change:   Intake/Output from previous day: 05/31 0701 - 06/01 0700 In: 240 [P.O.:240] Out: 1000  Intake/Output this shift: No intake/output data recorded.  General appearance: alert, cooperative, no distress and pale Resp: diminished breath sounds bilaterally Cardio: S1, S2 normal and systolic murmur: systolic ejection 2/6, decrescendo at 2nd left intercostal space GI: pos bs, liver down 4 cm Extremities: AVF RUA  Lab Results:  Recent Labs  02/11/17 2146 02/13/17 1546  WBC 9.2 8.9  HGB 9.4* 9.9*  HCT 29.0* 29.7*  PLT 256 268   BMET:  Recent Labs  02/11/17 2146 02/13/17 1544  NA 130* 132*  K 3.9 4.1  CL 98* 97*  CO2 20* 21*  GLUCOSE 97 119*  BUN 117* 86*  CREATININE 8.55* 7.45*  CALCIUM 9.0 8.8*   No results for input(s): PTH in the last 72 hours. Iron Studies: No results for input(s): IRON, TIBC, TRANSFERRIN, FERRITIN in the last 72 hours.  Studies/Results: No results found.  I have reviewed the patient's current medications.  Assessment/Plan: 1 ESRD for HD tomorrow and d/c 2 HTN better with less vol., lower dry 3 Anemia needs esa 4 CVA per rehab 5 HPTH P HD, esa, meds. rehab    LOS: 8 days   Suyash Amory L 02/14/2017,7:50 AM

## 2017-02-14 NOTE — Progress Notes (Signed)
Subjective/Complaints:  Patient seen the PT gym. He has occasional shortness of breath, however, oxygen saturation 100% room air.  ROS: pt denies nausea, vomiting, diarrhea, cough, shortness of breath or chest pain  Objective: Vital Signs: Blood pressure (!) 138/59, pulse 93, temperature 98.3 F (36.8 C), temperature source Oral, resp. rate 18, height 5' 10"  (1.778 m), weight 79.7 kg (175 lb 11.3 oz), SpO2 97 %. No results found. Results for orders placed or performed during the hospital encounter of 02/06/17 (from the past 72 hour(s))  Glucose, capillary     Status: Abnormal   Collection Time: 02/11/17  6:22 AM  Result Value Ref Range   Glucose-Capillary 59 (L) 65 - 99 mg/dL  Glucose, capillary     Status: Abnormal   Collection Time: 02/11/17  7:27 AM  Result Value Ref Range   Glucose-Capillary 128 (H) 65 - 99 mg/dL  Glucose, capillary     Status: Abnormal   Collection Time: 02/11/17 12:11 PM  Result Value Ref Range   Glucose-Capillary 203 (H) 65 - 99 mg/dL   Comment 1 Notify RN   Protime-INR     Status: Abnormal   Collection Time: 02/11/17  2:57 PM  Result Value Ref Range   Prothrombin Time 43.8 (H) 11.4 - 15.2 seconds   INR 4.48 (HH)     Comment: REPEATED TO VERIFY CRITICAL RESULT CALLED TO, READ BACK BY AND VERIFIED WITH: J.HICKS,RN 02/11/17 @1658  BY V.WILKINS   Glucose, capillary     Status: Abnormal   Collection Time: 02/11/17  4:20 PM  Result Value Ref Range   Glucose-Capillary 195 (H) 65 - 99 mg/dL   Comment 1 Notify RN   Glucose, capillary     Status: Abnormal   Collection Time: 02/11/17  8:58 PM  Result Value Ref Range   Glucose-Capillary 103 (H) 65 - 99 mg/dL  Renal function panel     Status: Abnormal   Collection Time: 02/11/17  9:46 PM  Result Value Ref Range   Sodium 130 (L) 135 - 145 mmol/L   Potassium 3.9 3.5 - 5.1 mmol/L   Chloride 98 (L) 101 - 111 mmol/L   CO2 20 (L) 22 - 32 mmol/L   Glucose, Bld 97 65 - 99 mg/dL   BUN 117 (H) 6 - 20 mg/dL    Creatinine, Ser 8.55 (H) 0.61 - 1.24 mg/dL   Calcium 9.0 8.9 - 10.3 mg/dL   Phosphorus 7.3 (H) 2.5 - 4.6 mg/dL   Albumin 2.8 (L) 3.5 - 5.0 g/dL   GFR calc non Af Amer 5 (L) >60 mL/min   GFR calc Af Amer 6 (L) >60 mL/min    Comment: (NOTE) The eGFR has been calculated using the CKD EPI equation. This calculation has not been validated in all clinical situations. eGFR's persistently <60 mL/min signify possible Chronic Kidney Disease.    Anion gap 12 5 - 15  CBC     Status: Abnormal   Collection Time: 02/11/17  9:46 PM  Result Value Ref Range   WBC 9.2 4.0 - 10.5 K/uL   RBC 3.15 (L) 4.22 - 5.81 MIL/uL   Hemoglobin 9.4 (L) 13.0 - 17.0 g/dL   HCT 29.0 (L) 39.0 - 52.0 %   MCV 92.1 78.0 - 100.0 fL   MCH 29.8 26.0 - 34.0 pg   MCHC 32.4 30.0 - 36.0 g/dL   RDW 14.8 11.5 - 15.5 %   Platelets 256 150 - 400 K/uL  Glucose, capillary     Status: Abnormal  Collection Time: 02/12/17  6:28 AM  Result Value Ref Range   Glucose-Capillary 108 (H) 65 - 99 mg/dL  Protime-INR     Status: Abnormal   Collection Time: 02/12/17  6:36 AM  Result Value Ref Range   Prothrombin Time 39.3 (H) 11.4 - 15.2 seconds   INR 3.91   Glucose, capillary     Status: Abnormal   Collection Time: 02/12/17 11:21 AM  Result Value Ref Range   Glucose-Capillary 163 (H) 65 - 99 mg/dL   Comment 1 Notify RN   Glucose, capillary     Status: Abnormal   Collection Time: 02/12/17  4:16 PM  Result Value Ref Range   Glucose-Capillary 133 (H) 65 - 99 mg/dL   Comment 1 Notify RN   Glucose, capillary     Status: Abnormal   Collection Time: 02/12/17  9:39 PM  Result Value Ref Range   Glucose-Capillary 120 (H) 65 - 99 mg/dL  Protime-INR     Status: Abnormal   Collection Time: 02/13/17  5:26 AM  Result Value Ref Range   Prothrombin Time 34.2 (H) 11.4 - 15.2 seconds   INR 3.29   Glucose, capillary     Status: None   Collection Time: 02/13/17  6:36 AM  Result Value Ref Range   Glucose-Capillary 79 65 - 99 mg/dL  Glucose,  capillary     Status: Abnormal   Collection Time: 02/13/17 11:59 AM  Result Value Ref Range   Glucose-Capillary 177 (H) 65 - 99 mg/dL  Renal function panel     Status: Abnormal   Collection Time: 02/13/17  3:44 PM  Result Value Ref Range   Sodium 132 (L) 135 - 145 mmol/L   Potassium 4.1 3.5 - 5.1 mmol/L   Chloride 97 (L) 101 - 111 mmol/L   CO2 21 (L) 22 - 32 mmol/L   Glucose, Bld 119 (H) 65 - 99 mg/dL   BUN 86 (H) 6 - 20 mg/dL   Creatinine, Ser 7.45 (H) 0.61 - 1.24 mg/dL   Calcium 8.8 (L) 8.9 - 10.3 mg/dL   Phosphorus 7.1 (H) 2.5 - 4.6 mg/dL   Albumin 2.9 (L) 3.5 - 5.0 g/dL   GFR calc non Af Amer 6 (L) >60 mL/min   GFR calc Af Amer 7 (L) >60 mL/min    Comment: (NOTE) The eGFR has been calculated using the CKD EPI equation. This calculation has not been validated in all clinical situations. eGFR's persistently <60 mL/min signify possible Chronic Kidney Disease.    Anion gap 14 5 - 15  CBC     Status: Abnormal   Collection Time: 02/13/17  3:46 PM  Result Value Ref Range   WBC 8.9 4.0 - 10.5 K/uL   RBC 3.20 (L) 4.22 - 5.81 MIL/uL   Hemoglobin 9.9 (L) 13.0 - 17.0 g/dL   HCT 29.7 (L) 39.0 - 52.0 %   MCV 92.8 78.0 - 100.0 fL   MCH 30.9 26.0 - 34.0 pg   MCHC 33.3 30.0 - 36.0 g/dL   RDW 14.9 11.5 - 15.5 %   Platelets 268 150 - 400 K/uL  Glucose, capillary     Status: Abnormal   Collection Time: 02/13/17  7:53 PM  Result Value Ref Range   Glucose-Capillary 54 (L) 65 - 99 mg/dL  Glucose, capillary     Status: Abnormal   Collection Time: 02/13/17  8:48 PM  Result Value Ref Range   Glucose-Capillary 128 (H) 65 - 99 mg/dL     HEENT:  PERRL Caridac:  RRR CHest: CTA B Extremity:  Pulses positive and No Edema Skin:   Bruising on both arms persistent Neuro: Alert/Oriented, Normal Sensory and Abnormal Motor 4 to 4+/5, bilateral deltoid by stress, grip, hip flexor and extensor, ankle dorsiflexor--good sitting balance Musc/Skel:  Other No pain with upper extremity or lower extremity  range of motion Gen. no acute distress but appears fatigued   Assessment/Plan: 1. Functional deficits secondary to Right subcortical infarct  Stable for D/C today F/u PCP in 3-4 weeks F/u PM&R 2 weeks See D/C summary See D/C instructions FIM: Function - Bathing Position: Shower Body parts bathed by patient: Right arm, Right upper leg, Left arm, Left upper leg, Abdomen, Chest, Right lower leg, Front perineal area, Left lower leg, Buttocks, Back Body parts bathed by helper: Buttocks, Right lower leg, Left lower leg, Back Assist Level: More than reasonable time  Function- Upper Body Dressing/Undressing What is the patient wearing?: Pull over shirt/dress Pull over shirt/dress - Perfomed by patient: Thread/unthread right sleeve, Thread/unthread left sleeve, Put head through opening, Pull shirt over trunk Pull over shirt/dress - Perfomed by helper: Pull shirt over trunk Assist Level: Set up Set up : To obtain clothing/put away Function - Lower Body Dressing/Undressing What is the patient wearing?: Pants, [employer], Shoes, Underwear Position: Sitting EOB Underwear - Performed by patient: Thread/unthread right underwear leg, Thread/unthread left underwear leg, Pull underwear up/down Pants- Performed by patient: Thread/unthread right pants leg, Thread/unthread left pants leg, Pull pants up/down, Fasten/unfasten pants Shoes - Performed by patient: Don/doff right shoe, Don/doff left shoe Shoes - Performed by helper: Don/doff right shoe, Don/doff left shoe TED Hose - Performed by helper: Don/doff right TED hose, Don/doff left TED hose Assist for footwear: Setup Assist for lower body dressing: Set up  Function - Toileting Toileting activity did not occur: No continent bowel/bladder event Toileting steps completed by patient: Performs perineal hygiene, Adjust clothing prior to toileting, Adjust clothing after toileting Toileting steps completed by helper: Adjust clothing prior to  toileting Toileting Assistive Devices: Grab bar or rail Assist level: More than reasonable time  Function Midwife transfer assistive device: Mechanical lift, Bedside commode Assist level to toilet: Supervision or verbal cues Assist level from toilet: Supervision or verbal cues  Function - Chair/bed transfer Chair/bed transfer method: Ambulatory Chair/bed transfer assist level: Supervision or verbal cues Chair/bed transfer assistive device: Armrests  Function - Locomotion: Wheelchair Will patient use wheelchair at discharge?: No Type: Manual Max wheelchair distance: 13f  Assist Level: Supervision or verbal cues Assist Level: Supervision or verbal cues Wheel 150 feet activity did not occur: Refused Assist Level: Supervision or verbal cues Turns around,maneuvers to table,bed, and toilet,negotiates 3% grade,maneuvers on rugs and over doorsills: No Function - Locomotion: Ambulation Assistive device: Cane-straight Max distance: 150 Assist level: Supervision or verbal cues Assist level: Supervision or verbal cues Assist level: Supervision or verbal cues Walk 150 feet activity did not occur: Safety/medical concerns Assist level: Supervision or verbal cues Assist level: Supervision or verbal cues  Function - Comprehension Comprehension: Auditory Comprehension assist level: Follows complex conversation/direction with no assist  Function - Expression Expression: Verbal Expression assist level: Expresses complex ideas: With no assist  Function - Social Interaction Social Interaction assist level: Interacts appropriately with others - No medications needed.  Function - Problem Solving Problem solving assist level: Solves basic problems with no assist  Function - Memory Memory assist level: Recognizes or recalls 90% of the time/requires cueing < 10% of the time  Patient normally able to recall (first 3 days only): Current season, Location of own room, That he or  she is in a hospital, Staff names and faces  Medical Problem List and Plan: 1. Gait disorder secondary to right subcortical infarct  -plan D/C today 2. DVT Prophylaxis/Anticoagulation: .   -per pharm resumed coumadin    - 3. Pain Management: Tylenol as needed 4. Mood: Provide emotional support 5. Neuropsych: This patient iscapable of making decisions on hisown behalf. 6. Skin/Wound Care: Routine skin checks 7. Fluids/Electrolytes/Nutrition: encourage PO 8.Atrial fibrillation. Continue Coumadin therapy. Cardiac rate controlled. Follow-up cardiology services 9.ESRD. Continue hemodialysis as per renal services. HD after therapies 10.Diabetes mellitus of peripheral neuropathy. Hemoglobin A1c 7.0.SSI.Check blood sugars before meals and at bedtime. Patient on Amaryl 4 mg twice a day prior to admission.    -some lability recently but overall improved  -intake is consistent  -on amaryl 69m QD currently 11.Acute on chronic anemia. Continue Aranesp 12.Hypertension. Coreg 3.125 mg twice a day 13.BPH. Uroxatral 10 mg daily, Proscar 5 mg daily. uroxatral held for urinary retention 14.Hyperlipidemia. Zetia 15. Constipation: augmented regimen  LOS (Days) 8 A FACE TO FACE EVALUATION WAS PERFORMED  KIRSTEINS,ANDREW E 02/14/2017, 6:07 AM

## 2017-02-14 NOTE — Discharge Summary (Signed)
Discharge summary job [id number]

## 2017-02-14 NOTE — Progress Notes (Signed)
ANTICOAGULATION CONSULT NOTE - Follow Up Consult  Pharmacy Consult for coumadin Indication: atrial fibrillation in setting of acute stroke/TIA    Allergies  Allergen Reactions  . Amlodipine Besylate Swelling    angioedema  . Lisinopril Other (See Comments)    Renal insufficiency  . Statins Other (See Comments)    Muscle aches   Patient Measurements: Height: 5\' 10"  (177.8 cm) Weight: 175 lb 11.3 oz (79.7 kg) IBW/kg (Calculated) : 73  Assessment: 67 yoM with atrial fibrillation in setting of acute stroke/TIA on warfarin. INR labile and appears to be very sensitive. INR is now down to 2.46. Hgb 9.9, plts wnl. He received only 13 mg coumadin since 5/24. Plan for discharge today.  Goal of Therapy:  INR 2-3 Monitor platelets by anticoagulation protocol: Yes   Plan:  Coumadin 1.5 mg po x 1 Monitor daily INR, CBC, s/s of bleed If discharge today, recommend 1.5 mg daily with INR follow up early next week.  Maryanna Shape, PharmD, BCPS  Clinical Pharmacist  Pager: [phone]   02/14/2017 1:53 PM

## 2017-02-14 NOTE — Progress Notes (Signed)
Speech Language Pathology Session Note & Discharge Summary  Patient Details  Name: James David MRN: [id number] Date of Birth: 10/31/1935  Today's Date: 02/14/2017 SLP Individual Time: 1300-1355 SLP Individual Time Calculation (min): 55 min   Skilled Therapeutic Interventions:   Skilled treatment session focused on cognitive goals. SLP facilitated session by re-administering the MoCA (version 7.3). Patient scored 28/30 points with a score of 16 or above considered normal with mild deficits in short-term recall. Patient also required supervision verbal cues for anticipatory awareness in regards to d/c planning. Patient left upright in wheelchair with all needs within reach. Continue with current plan of care.   Patient has met 3 of 3 long term goals.  Patient to discharge at overall Supervision level.   Reasons goals not met: N/A   Clinical Impression/Discharge Summary: Patient has made functional gains and has met 3 of 3 LTGs this admission due to improved cognitive functioning. Currently, patient requires overall supervision verbal cues to complete functional and familiar tasks safely in regards to problem solving, recall and awareness. Patient and family education is complete and patient will discharge home with assistance from wife. Patient would benefit from f/u SLP services to maximize his overall functional independence.   Care Partner:  Caregiver Able to Provide Assistance: Yes  Type of Caregiver Assistance: Cognitive  Recommendation:  24 hour supervision/assistance;Home Health SLP  Rationale for SLP Follow Up: Maximize cognitive function and independence;Reduce caregiver burden   Equipment: N/A   Reasons for discharge: Treatment goals met;Discharged from hospital   Patient/Family Agrees with Progress Made and Goals Achieved: Yes   Function:  Cognition Comprehension Comprehension assist level: Follows complex conversation/direction with no assist  Expression   Expression  assist level: Expresses complex ideas: With no assist  Social Interaction Social Interaction assist level: Interacts appropriately with others - No medications needed.  Problem Solving Problem solving assist level: Solves basic problems with no assist  Memory Memory assist level: Recognizes or recalls 90% of the time/requires cueing < 10% of the time   James David 02/14/2017, 3:52 PM

## 2017-02-14 NOTE — Progress Notes (Signed)
Occupational Therapy Discharge Summary  Patient Details  Name: James David MRN: [id number] Date of Birth: 18-Dec-1935   Patient has met 30 of 74 long term goals due to improved activity tolerance, improved balance, postural control, ability to compensate for deficits, functional use of  LEFT lower extremity, improved attention, improved awareness and improved coordination.  Patient to discharge at overall Modified Independent level, except for S with tub transfers and bathing.  Patient's care partner is independent to provide the necessary physical and cognitive assistance at discharge.    Reasons goals not met: n/a  Recommendation:  Patient will benefit from ongoing skilled OT services in home health setting to continue to advance functional skills in the area of BADL and iADL.  Equipment: tub bench  Reasons for discharge: treatment goals met  Patient/family agrees with progress made and goals achieved: Yes  OT Discharge Precautions/Restrictions  Precautions Precautions: Fall Restrictions Weight Bearing Restrictions: No  Pain Pain Assessment Pain Assessment: No/denies pain Pain Score: 0-No pain ADL ADL ADL Comments: supervision to mod I overall - refer to functional navigator Vision Baseline Vision/History: Wears glasses Wears Glasses: Reading only Patient Visual Report: No change from baseline Vision Assessment?: Yes Eye Alignment: Within Functional Limits Ocular Range of Motion: Within Functional Limits Alignment/Gaze Preference: Within Defined Limits Tracking/Visual Pursuits: Able to track stimulus in all quads without difficulty Saccades: Within functional limits Visual Fields: No apparent deficits Perception  Perception: Impaired Inattention/Neglect: Other (comment) (mild inattention to the L) Praxis Praxis: Intact Cognition Overall Cognitive Status: Within Functional Limits for tasks assessed Orientation Level: Oriented X4 Sensation Sensation Light  Touch: Appears Intact Proprioception: Appears Intact Coordination Gross Motor Movements are Fluid and Coordinated: Yes Fine Motor Movements are Fluid and Coordinated: Yes Heel Shin Test: [facility] Motor  Motor Motor: Other (comment) Motor - Skilled Clinical Observations: generalized deconditioning Mobility  Bed Mobility Bed Mobility: Rolling Right;Rolling Left;Supine to Sit;Sit to Supine Rolling Right: 6: Modified independent (Device/Increase time) Rolling Left: 6: Modified independent (Device/Increase time) Supine to Sit: 6: Modified independent (Device/Increase time) Sit to Supine: 6: Modified independent (Device/Increase time) Sit to Supine - Details (indicate cue type and reason): increased time and effort  Transfers Sit to Stand: 6: Modified independent (Device/Increase time) Stand to Sit: 6: Modified independent (Device/Increase time)  Trunk/Postural Assessment  Cervical Assessment Cervical Assessment: Exceptions to [facility] Thoracic Assessment Thoracic Assessment: Exceptions to [facility] Lumbar Assessment Lumbar Assessment: Exceptions to [facility] (posterior pelvic tilt) Postural Control Postural Control: Within Functional Limits  Balance Balance Balance Assessed: Yes Standardized Balance Assessment Standardized Balance Assessment: Berg Balance Test Berg Balance Test Sit to Stand: Able to stand without using hands and stabilize independently Standing Unsupported: Able to stand safely 2 minutes Sitting with Back Unsupported but Feet Supported on Floor or Stool: Able to sit safely and securely 2 minutes Stand to Sit: Sits safely with minimal use of hands Transfers: Able to transfer safely, definite need of hands Standing Unsupported with Eyes Closed: Able to stand 10 seconds safely Standing Ubsupported with Feet Together: Able to place feet together independently and stand 1 minute safely From Standing, Reach Forward with Outstretched Arm: Can reach confidently >25 cm (10") From Standing  Position, Pick up Object from Floor: Able to pick up shoe, needs supervision From Standing Position, Turn to Look Behind Over each Shoulder: Turn sideways only but maintains balance Turn 360 Degrees: Able to turn 360 degrees safely but slowly Standing Unsupported, Alternately Place Feet on Step/Stool: Able to complete >2 steps/needs minimal assist Standing Unsupported,  One Foot in Front: Able to take small step independently and hold 30 seconds Standing on One Leg: Tries to lift leg/unable to hold 3 seconds but remains standing independently Total Score: 42 Static Standing Balance Static Standing - Level of Assistance: 6: Modified independent (Device/Increase time) Static Stance: Eyes closed Static Stance: Eyes Closed: no assist from PT.  Dynamic Standing Balance Dynamic Standing - Balance Support: Right upper extremity supported Dynamic Standing - Level of Assistance: 6: Modified independent (Device/Increase time) Dynamic Standing - Balance Activities: Reaching for weighted objects;Reaching for objects Extremity/Trunk Assessment RUE Assessment RUE Assessment: Within Functional Limits LUE Assessment LUE Assessment: Within Functional Limits   See Function Navigator for Current Functional Status.  [place] 02/14/2017, 11:20 AM

## 2017-02-14 NOTE — Progress Notes (Signed)
Social Work Patient ID: James David, male   DOB: August 04, 1936, 81 y.o.   MRN: [id number]  Contacted HD to place pt on first run in am prior to discharge.

## 2017-02-14 NOTE — Progress Notes (Signed)
Occupational Therapy Session Note  Patient Details  Name: James David MRN: [id number] Date of Birth: 05/31/1936  Today's Date: 02/14/2017 OT Individual Time: 0900-1000 OT Individual Time Calculation (min): 60 min    Short Term Goals: Week 1:  OT Short Term Goal 1 (Week 1): Pt will maintain standing balance with close supervision while obtaining clothing in prep for dressing  OT Short Term Goal 2 (Week 1): Pt will demonstrate intellecual awareness of dec attention to the left in functional tasks OT Short Term Goal 3 (Week 1): Pt will don LB clothing sit to stand with supervision  OT Short Term Goal 4 (Week 1): Pt will perform 3/3 grooming tasks in standing with supervision   Skilled Therapeutic Interventions/Progress Updates:     OT treatment session focused on standing balance/endurance, safety awareness, and energy conservation techniques within modified bathing/dressing tasks. Pt gathered clothing using cane for functional mobility within room. Pt sat on commode to doff clothing with increased time. He then took small steps to shower with good safety awareness and technique. Pt completed shower with overall Mod I. He required rest break prior to donning clothing, but able to don underwear, pants, and shirt with increased time. Pt ambulated to recliner to rest his back, while OT demonstrated modified technique for donning TED hose using friction reducing device. Pt able to don TED hose with increased time and VC for new technique, then donned shoes without assistance. OT educated pt on energy conservation techniques and home environment modifications for safe ADL participation at dc. Pt left seated in recliner with needs met.   Therapy Documentation Precautions:  Precautions Precautions: Fall Restrictions Weight Bearing Restrictions: No Pain: Pain Assessment Pain Assessment: No/denies pain Pain Score: 0-No pain ADL: ADL ADL Comments: see functional navigator  See Function Navigator  for Current Functional Status.   Therapy/Group: Individual Therapy  Valma Cava 02/14/2017, 9:28 AM

## 2017-02-14 NOTE — Progress Notes (Signed)
Physical Therapy Discharge Summary  Patient Details  Name: James David MRN: [id number] Date of Birth: 1936/01/31  Today's Date: 02/14/2017 PT Individual Time: 0804-0901 PT Individual Time Calculation (min): 57 min    Patient has met 11 of 12 long term goals due to improved activity tolerance, improved balance, improved postural control and ability to compensate for deficits.  Patient to discharge at an ambulatory level Modified Independent.   Patient's care partner is independent to provide the necessary physical assistance at discharge.  Reasons goals not met: Pt declined floor transfer due to fatigue.   Recommendation:  Patient will benefit from ongoing skilled PT services in home health setting to continue to advance safe functional mobility, address ongoing impairments in balance, endurance, gait, safety, and minimize fall risk.  Equipment: No equipment provided  Reasons for discharge: treatment goals met and discharge from hospital  Patient/family agrees with progress made and goals achieved: Yes   PT Treatment:  Pt instructed in Grad day assessment to measure progress toward goals. See below for results.   PT Discharge   Pain Pain Assessment Pain Assessment: No/denies pain Pain Score: 0-No pain Vision/Perception  Vision - Assessment Eye Alignment: Within Functional Limits Alignment/Gaze Preference: Within Defined Limits Saccades: Within functional limits Perception Perception: Impaired Inattention/Neglect: Other (comment) (mild inattention to the L) Praxis Praxis: Intact  Cognition   Sensation Sensation Light Touch: Appears Intact Proprioception: Appears Intact Coordination Gross Motor Movements are Fluid and Coordinated: Yes Fine Motor Movements are Fluid and Coordinated: Yes Heel Shin Test: [facility] Motor  Motor Motor: Other (comment) Motor - Skilled Clinical Observations: generalized deconditioning  Mobility Bed Mobility Bed Mobility: Rolling  Right;Rolling Left;Supine to Sit;Sit to Supine Rolling Right: 6: Modified independent (Device/Increase time) Rolling Left: 6: Modified independent (Device/Increase time) Supine to Sit: 6: Modified independent (Device/Increase time) Sit to Supine: 6: Modified independent (Device/Increase time) Sit to Supine - Details (indicate cue type and reason): increased time and effort  Transfers Transfers: Yes Sit to Stand: 6: Modified independent (Device/Increase time) Stand to Sit: 6: Modified independent (Device/Increase time) Stand Pivot Transfers: 6: Modified independent (Device/Increase time) (with SPC) Locomotion  Ambulation Ambulation: Yes Ambulation/Gait Assistance: 6: Modified independent (Device/Increase time) Ambulation Distance (Feet): 170 Feet Assistive device: Straight cane Gait Gait: Yes Gait Pattern: Impaired Gait Pattern: Shuffle;Decreased stride length High Level Ambulation High Level Ambulation: Backwards walking Backwards Walking: supervision assist with cues for AD management Stairs / Additional Locomotion Stairs: Yes Stairs Assistance: 5: Supervision Stair Management Technique: Two rails Number of Stairs: 12 Curb: 5: Supervision (with SPC and cues for AD management to maintain reciprocal pattern) Wheelchair Mobility Wheelchair Mobility: No  Trunk/Postural Assessment  Cervical Assessment Cervical Assessment: Exceptions to [facility] Thoracic Assessment Thoracic Assessment: Exceptions to [facility] Lumbar Assessment Lumbar Assessment: Exceptions to [facility] (posterior pelvic tilt) Postural Control Postural Control: Within Functional Limits  Balance Balance Balance Assessed: Yes Standardized Balance Assessment Standardized Balance Assessment: Berg Balance Test Berg Balance Test Sit to Stand: Able to stand without using hands and stabilize independently Standing Unsupported: Able to stand safely 2 minutes Sitting with Back Unsupported but Feet Supported on Floor or Stool: Able to  sit safely and securely 2 minutes Stand to Sit: Sits safely with minimal use of hands Transfers: Able to transfer safely, definite need of hands Standing Unsupported with Eyes Closed: Able to stand 10 seconds safely Standing Ubsupported with Feet Together: Able to place feet together independently and stand 1 minute safely From Standing, Reach Forward with Outstretched Arm: Can reach confidently >  25 cm (10") From Standing Position, Pick up Object from Floor: Able to pick up shoe, needs supervision From Standing Position, Turn to Look Behind Over each Shoulder: Turn sideways only but maintains balance Turn 360 Degrees: Able to turn 360 degrees safely but slowly Standing Unsupported, Alternately Place Feet on Step/Stool: Able to complete >2 steps/needs minimal assist Standing Unsupported, One Foot in Front: Able to take small step independently and hold 30 seconds Standing on One Leg: Tries to lift leg/unable to hold 3 seconds but remains standing independently Total Score: 42 Static Standing Balance Static Standing - Level of Assistance: 6: Modified independent (Device/Increase time) Static Stance: Eyes closed Static Stance: Eyes Closed: no assist from PT.  Dynamic Standing Balance Dynamic Standing - Balance Support: Right upper extremity supported Dynamic Standing - Level of Assistance: 6: Modified independent (Device/Increase time) Dynamic Standing - Balance Activities: Reaching for weighted objects;Reaching for objects Extremity Assessment      RLE Assessment RLE Assessment: Within Functional Limits LLE Assessment LLE Assessment: Within Functional Limits (mild decrease in hip flexion 4+/5 all others tested 5/5)   See Function Navigator for Current Functional Status.  Lorie Phenix 02/14/2017, 9:06 AM

## 2017-02-14 NOTE — Progress Notes (Signed)
Occupational Therapy Session Note  Patient Details  Name: James David MRN: [id number] Date of Birth: 1935/11/12  Today's Date: 02/14/2017 OT Individual Time: 1045-1200 OT Individual Time Calculation (min): 75 min    Short Term Goals: Week 1:  OT Short Term Goal 1 (Week 1): Pt will maintain standing balance with close supervision while obtaining clothing in prep for dressing  OT Short Term Goal 2 (Week 1): Pt will demonstrate intellecual awareness of dec attention to the left in functional tasks OT Short Term Goal 3 (Week 1): Pt will don LB clothing sit to stand with supervision  OT Short Term Goal 4 (Week 1): Pt will perform 3/3 grooming tasks in standing with supervision      Skilled Therapeutic Interventions/Progress Updates:    Pt seen this session to review discharge plans of HHOT, equipment of tub bench. Pt taken to tub room to practice transfers on and off tub bench using cane for support and S.    Pt ambulated to gym with cane and worked on standing balance exercises for 3-4 minutes at a time with a seated rest break in between sets working on UE exercises.   Standing with at least one hand for support on parallel bar: heel raises Alternating kicks Alternating side leg lifts Left side lunging while reaching with L hand towards floor  UE: 4 lb dowel bar with chest presses 10x Elbow flex/ext with shoulders at 45 degrees flexion 10x  Pt completed several sets of standing and UE exercises with a few short rest breaks. He ambulated back to room with S with cane and then needed to use the w.c after 150 ft due to fatigue. Pt remained in w/c for lunch.  Pt in room with all needs met.  He is now mod I in the room.   Therapy Documentation Precautions:  Precautions Precautions: Fall Restrictions Weight Bearing Restrictions: No    Vital Signs: Therapy Vitals Temp: 98.3 F (36.8 C) Temp Source: Oral Pulse Rate: 93 Resp: 18 BP: (!) 138/59 Patient Position (if appropriate):  Lying Oxygen Therapy SpO2: 97 % O2 Device: Not Delivered Pain: Pain Assessment Pain Assessment: No/denies pain Pain Score: 0-No pain ADL: ADL ADL Comments: see functional navigator   See Function Navigator for Current Functional Status.   Therapy/Group: Individual Therapy  [place] 02/14/2017, 8:32 AM

## 2017-02-15 LAB — GLUCOSE, CAPILLARY: Glucose-Capillary: 74 mg/dL (ref 65–99)

## 2017-02-15 LAB — CBC
HCT: 28.4 % — ABNORMAL LOW (ref 39.0–52.0)
Hemoglobin: 9.4 g/dL — ABNORMAL LOW (ref 13.0–17.0)
MCH: 30.4 pg (ref 26.0–34.0)
MCHC: 33.1 g/dL (ref 30.0–36.0)
MCV: 91.9 fL (ref 78.0–100.0)
PLATELETS: 206 10*3/uL (ref 150–400)
RBC: 3.09 MIL/uL — AB (ref 4.22–5.81)
RDW: 14.5 % (ref 11.5–15.5)
WBC: 5.5 10*3/uL (ref 4.0–10.5)

## 2017-02-15 LAB — PROTIME-INR
INR: 2.38
Prothrombin Time: 26.4 seconds — ABNORMAL HIGH (ref 11.4–15.2)

## 2017-02-15 LAB — RENAL FUNCTION PANEL
Albumin: 2.7 g/dL — ABNORMAL LOW (ref 3.5–5.0)
Anion gap: 11 (ref 5–15)
BUN: 54 mg/dL — AB (ref 6–20)
CALCIUM: 8.4 mg/dL — AB (ref 8.9–10.3)
CHLORIDE: 97 mmol/L — AB (ref 101–111)
CO2: 25 mmol/L (ref 22–32)
CREATININE: 5.07 mg/dL — AB (ref 0.61–1.24)
GFR calc Af Amer: 11 mL/min — ABNORMAL LOW (ref >60)
GFR, EST NON AFRICAN AMERICAN: 10 mL/min — AB (ref >60)
Glucose, Bld: 102 mg/dL — ABNORMAL HIGH (ref 65–99)
Phosphorus: 4.6 mg/dL (ref 2.5–4.6)
Potassium: 2.9 mmol/L — ABNORMAL LOW (ref 3.5–5.1)
SODIUM: 133 mmol/L — AB (ref 135–145)

## 2017-02-15 MED ORDER — LIDOCAINE-PRILOCAINE 2.5-2.5 % EX CREA: 1.0000 "application " | TOPICAL_CREAM | CUTANEOUS | Status: DC | PRN

## 2017-02-15 MED ORDER — SODIUM CHLORIDE 0.9 % IV SOLN: 100.0000 mL | INTRAVENOUS | Status: DC | PRN

## 2017-02-15 MED ORDER — WARFARIN SODIUM 1 MG PO TABS
1.5000 mg | ORAL_TABLET | Freq: Once | ORAL | Status: DC
  Filled 2017-02-15: qty 1

## 2017-02-15 MED ORDER — DOXERCALCIFEROL 4 MCG/2ML IV SOLN
INTRAVENOUS | Status: AC
  Administered 2017-02-15: 4 ug via INTRAVENOUS
  Filled 2017-02-15: qty 2

## 2017-02-15 MED ORDER — HEPARIN SODIUM (PORCINE) 1000 UNIT/ML DIALYSIS: 100.0000 [IU]/kg | INTRAMUSCULAR | Status: DC | PRN

## 2017-02-15 MED ORDER — ALTEPLASE 2 MG IJ SOLR: 2.0000 mg | Freq: Once | INTRAMUSCULAR | Status: DC | PRN

## 2017-02-15 MED ORDER — LIDOCAINE HCL (PF) 1 % IJ SOLN: 5.0000 mL | INTRAMUSCULAR | Status: DC | PRN

## 2017-02-15 MED ORDER — DARBEPOETIN ALFA 150 MCG/0.3ML IJ SOSY: 150.0000 ug | PREFILLED_SYRINGE | INTRAMUSCULAR | Status: DC

## 2017-02-15 MED ORDER — PENTAFLUOROPROP-TETRAFLUOROETH EX AERO: 1.0000 "application " | INHALATION_SPRAY | CUTANEOUS | Status: DC | PRN

## 2017-02-15 MED ORDER — HEPARIN SODIUM (PORCINE) 1000 UNIT/ML DIALYSIS: 1000.0000 [IU] | INTRAMUSCULAR | Status: DC | PRN

## 2017-02-15 NOTE — Progress Notes (Signed)
Subjective: Interval History: has no complaint, doing well, wants to go home.  Objective: Vital signs in last 24 hours: Temp:  [97.4 F (36.3 C)-97.6 F (36.4 C)] 97.5 F (36.4 C) (06/02 0725) Pulse Rate:  [77-96] 77 (06/02 0800) Resp:  [18] 18 (06/02 0725) BP: (107-149)/(48-73) 107/55 (06/02 0800) SpO2:  [97 %-100 %] 97 % (06/02 0725) Weight:  [79.6 kg (175 lb 7.8 oz)] 79.6 kg (175 lb 7.8 oz) (06/02 0725) Weight change:   Intake/Output from previous day: 06/01 0701 - 06/02 0700 In: 720 [P.O.:720] Out: -  Intake/Output this shift: No intake/output data recorded.  General appearance: alert, cooperative, no distress and pale Resp: clear to auscultation bilaterally Cardio: S1, S2 normal and systolic murmur: systolic ejection 2/6, decrescendo at 2nd left intercostal space GI: obese, soft, liver down 5 cm Extremities: avf RUA  Lab Results:  Recent Labs  02/13/17 1546 02/15/17 0749  WBC 8.9 5.5  HGB 9.9* 9.4*  HCT 29.7* 28.4*  PLT 268 206   BMET:  Recent Labs  02/13/17 1544  NA 132*  K 4.1  CL 97*  CO2 21*  GLUCOSE 119*  BUN 86*  CREATININE 7.45*  CALCIUM 8.8*   No results for input(s): PTH in the last 72 hours. Iron Studies: No results for input(s): IRON, TIBC, TRANSFERRIN, FERRITIN in the last 72 hours.  Studies/Results: No results found.  I have reviewed the patient's current medications.  Assessment/Plan: 1 ESRD for HD 2 CVA per rehab 3 anemia esa 4 HPTH 5 Pacer 6 DM controlled P HD, D/C per rehab, communicate with outpatient unit    LOS: 9 days   Benay Pomeroy L 02/15/2017,8:13 AM

## 2017-02-15 NOTE — Procedures (Signed)
I was present at this session.  I have reviewed the session itself and made appropriate changes.  Access press ok.  bp in low 100s.   [name] 6/2/20188:11 AM

## 2017-02-15 NOTE — Progress Notes (Signed)
ANTICOAGULATION CONSULT NOTE - Follow Up Consult  Pharmacy Consult for coumadin Indication: atrial fibrillation in setting of acute stroke/TIA    Allergies  Allergen Reactions  . Amlodipine Besylate Swelling    angioedema  . Lisinopril Other (See Comments)    Renal insufficiency  . Statins Other (See Comments)    Muscle aches   Patient Measurements: Height: 5\' 10"  (177.8 cm) Weight: 168 lb 6.9 oz (76.4 kg) IBW/kg (Calculated) : 73  Assessment: 44 yoM with atrial fibrillation in setting of acute stroke/TIA on warfarin. INR labile and appears to be very sensitive. PTA dose is 2.5mg /day, which has appeared to be too much during this admission. In past 7 days, patient has received only 4mg  of warfarin due to holding doses with elevated INR.   INR is trending down to 2.38 after several held doses. Hgb 9.4, plts wnl. No bleeding noted  Goal of Therapy:  INR 2-3 Monitor platelets by anticoagulation protocol: Yes   Plan:  Warfarin 1.5 mg po x 1 Monitor daily INR, CBC, s/s of bleed If discharge today, recommend 1.5 mg daily with INR follow up early next week.  Demetrius Charity, PharmD Acute Care Pharmacy Resident  Pager: [phone] 02/15/2017

## 2017-02-15 NOTE — Progress Notes (Signed)
Patient and spouse received discharge instructions from Marlowe [place], PA-C on 02/14/17 with verbal understanding. Patient discharged to home with spouse and patient belongings.

## 2017-02-16 ENCOUNTER — Emergency Department (HOSPITAL_COMMUNITY): Payer: Medicare Other

## 2017-02-16 ENCOUNTER — Emergency Department (HOSPITAL_COMMUNITY)
Admission: EM | Admit: 2017-02-16 | Discharge: 2017-02-16 | Disposition: A | Discharge status: Home / Self Care | Payer: Medicare Other | Attending: Emergency Medicine | Admitting: Emergency Medicine

## 2017-02-16 ENCOUNTER — Encounter (HOSPITAL_COMMUNITY): Payer: Self-pay | Admitting: Emergency Medicine

## 2017-02-16 DIAGNOSIS — R339 Retention of urine, unspecified: Secondary | ICD-10-CM

## 2017-02-16 DIAGNOSIS — Z7984 Long term (current) use of oral hypoglycemic drugs: Secondary | ICD-10-CM | POA: Insufficient documentation

## 2017-02-16 DIAGNOSIS — I5031 Acute diastolic (congestive) heart failure: Secondary | ICD-10-CM | POA: Insufficient documentation

## 2017-02-16 DIAGNOSIS — I132 Hypertensive heart and chronic kidney disease with heart failure and with stage 5 chronic kidney disease, or end stage renal disease: Secondary | ICD-10-CM | POA: Insufficient documentation

## 2017-02-16 DIAGNOSIS — Z992 Dependence on renal dialysis: Secondary | ICD-10-CM | POA: Insufficient documentation

## 2017-02-16 DIAGNOSIS — Z7982 Long term (current) use of aspirin: Secondary | ICD-10-CM | POA: Insufficient documentation

## 2017-02-16 DIAGNOSIS — Z7901 Long term (current) use of anticoagulants: Secondary | ICD-10-CM | POA: Insufficient documentation

## 2017-02-16 DIAGNOSIS — I252 Old myocardial infarction: Secondary | ICD-10-CM | POA: Insufficient documentation

## 2017-02-16 DIAGNOSIS — R531 Weakness: Secondary | ICD-10-CM | POA: Diagnosis present

## 2017-02-16 DIAGNOSIS — I951 Orthostatic hypotension: Secondary | ICD-10-CM

## 2017-02-16 DIAGNOSIS — N390 Urinary tract infection, site not specified: Secondary | ICD-10-CM | POA: Insufficient documentation

## 2017-02-16 DIAGNOSIS — N186 End stage renal disease: Secondary | ICD-10-CM | POA: Insufficient documentation

## 2017-02-16 DIAGNOSIS — Z8673 Personal history of transient ischemic attack (TIA), and cerebral infarction without residual deficits: Secondary | ICD-10-CM | POA: Diagnosis not present

## 2017-02-16 DIAGNOSIS — R319 Hematuria, unspecified: Secondary | ICD-10-CM | POA: Insufficient documentation

## 2017-02-16 DIAGNOSIS — Z79899 Other long term (current) drug therapy: Secondary | ICD-10-CM | POA: Diagnosis not present

## 2017-02-16 DIAGNOSIS — Z85828 Personal history of other malignant neoplasm of skin: Secondary | ICD-10-CM | POA: Diagnosis not present

## 2017-02-16 DIAGNOSIS — E1122 Type 2 diabetes mellitus with diabetic chronic kidney disease: Secondary | ICD-10-CM | POA: Insufficient documentation

## 2017-02-16 LAB — COMPREHENSIVE METABOLIC PANEL
ALBUMIN: 3.2 g/dL — AB (ref 3.5–5.0)
ALK PHOS: 45 U/L (ref 38–126)
ALT: 23 U/L (ref 17–63)
ANION GAP: 11 (ref 5–15)
AST: 25 U/L (ref 15–41)
BUN: 57 mg/dL — AB (ref 6–20)
CALCIUM: 9.1 mg/dL (ref 8.9–10.3)
CO2: 24 mmol/L (ref 22–32)
Chloride: 96 mmol/L — ABNORMAL LOW (ref 101–111)
Creatinine, Ser: 6.61 mg/dL — ABNORMAL HIGH (ref 0.61–1.24)
GFR calc Af Amer: 8 mL/min — ABNORMAL LOW (ref >60)
GFR calc non Af Amer: 7 mL/min — ABNORMAL LOW (ref >60)
GLUCOSE: 197 mg/dL — AB (ref 65–99)
Potassium: 4.9 mmol/L (ref 3.5–5.1)
SODIUM: 131 mmol/L — AB (ref 135–145)
Total Bilirubin: 0.7 mg/dL (ref 0.3–1.2)
Total Protein: 5.6 g/dL — ABNORMAL LOW (ref 6.5–8.1)

## 2017-02-16 LAB — CBC WITH DIFFERENTIAL/PLATELET
Basophils Absolute: 0 10*3/uL (ref 0.0–0.1)
Basophils Relative: 0 %
EOS ABS: 0 10*3/uL (ref 0.0–0.7)
Eosinophils Relative: 0 %
HEMATOCRIT: 31.3 % — AB (ref 39.0–52.0)
Hemoglobin: 10.2 g/dL — ABNORMAL LOW (ref 13.0–17.0)
LYMPHS ABS: 0.9 10*3/uL (ref 0.7–4.0)
Lymphocytes Relative: 10 %
MCH: 30.6 pg (ref 26.0–34.0)
MCHC: 32.6 g/dL (ref 30.0–36.0)
MCV: 94 fL (ref 78.0–100.0)
MONOS PCT: 8 %
Monocytes Absolute: 0.7 10*3/uL (ref 0.1–1.0)
NEUTROS ABS: 7.2 10*3/uL (ref 1.7–7.7)
NEUTROS PCT: 82 %
Platelets: 243 10*3/uL (ref 150–400)
RBC: 3.33 MIL/uL — ABNORMAL LOW (ref 4.22–5.81)
RDW: 14.6 % (ref 11.5–15.5)
WBC: 8.9 10*3/uL (ref 4.0–10.5)

## 2017-02-16 LAB — I-STAT TROPONIN, ED: TROPONIN I, POC: 0.07 ng/mL (ref 0.00–0.08)

## 2017-02-16 LAB — URINALYSIS, ROUTINE W REFLEX MICROSCOPIC
BILIRUBIN URINE: NEGATIVE
Glucose, UA: NEGATIVE mg/dL
Ketones, ur: NEGATIVE mg/dL
Nitrite: NEGATIVE
Specific Gravity, Urine: 1.012 (ref 1.005–1.030)
pH: 7 (ref 5.0–8.0)

## 2017-02-16 LAB — PROTIME-INR
INR: 2.12
Prothrombin Time: 24.1 seconds — ABNORMAL HIGH (ref 11.4–15.2)

## 2017-02-16 LAB — I-STAT CG4 LACTIC ACID, ED: Lactic Acid, Venous: 1.83 mmol/L (ref 0.5–1.9)

## 2017-02-16 MED ORDER — DEXTROSE 5 % IV SOLN
1.0000 g | Freq: Once | INTRAVENOUS | Status: AC
  Administered 2017-02-16: 1 g via INTRAVENOUS
  Filled 2017-02-16: qty 10

## 2017-02-16 MED ORDER — CEPHALEXIN 500 MG PO CAPS: 500.0000 mg | ORAL_CAPSULE | Freq: Two times a day (BID) | ORAL | 0 refills | Status: DC

## 2017-02-16 NOTE — ED Provider Notes (Signed)
Patient signed out to follow-up CT scan of the head. Patient presented after having episode of lightheadedness and general weakness. No syncope, chest pain, shortness of breath or headache. Patient currently has no symptoms however was recently in the hospital for a stroke. Patient denies any known new stroke symptoms. Patient feels at baseline from when he left the hospital. Patient has pacemaker and had it checked recently and was told it was working fine. Patient general blood work which was similar to his previous, CT scan of the head no acute findings. Patient is not urinated since yesterday and has a large prostate in the past. Plan for and out cath, urinalysis and reassessment. Oral fluids and ambulation with cane for which he normally uses.  Urinalysis shows urine infection which explains patient's retention. Rocephin given in the ER with oral antibiotics for home and close outpatient follow-up. Pt ambulated in the ED.   Results and differential diagnosis were discussed with the patient/parent/guardian. Xrays were independently reviewed by myself.  Close follow up outpatient was discussed, comfortable with the plan.   Labs Reviewed  COMPREHENSIVE METABOLIC PANEL - Abnormal; Notable for the following:       Result Value   Sodium 131 (*)    Chloride 96 (*)    Glucose, Bld 197 (*)    BUN 57 (*)    Creatinine, Ser 6.61 (*)    Total Protein 5.6 (*)    Albumin 3.2 (*)    GFR calc non Af Amer 7 (*)    GFR calc Af Amer 8 (*)    All other components within normal limits  CBC WITH DIFFERENTIAL/PLATELET - Abnormal; Notable for the following:    RBC 3.33 (*)    Hemoglobin 10.2 (*)    HCT 31.3 (*)    All other components within normal limits  PROTIME-INR - Abnormal; Notable for the following:    Prothrombin Time 24.1 (*)    All other components within normal limits  URINALYSIS, ROUTINE W REFLEX MICROSCOPIC - Abnormal; Notable for the following:    APPearance TURBID (*)    Hgb urine dipstick  SMALL (*)    Protein, ur >=300 (*)    Leukocytes, UA LARGE (*)    Bacteria, UA MANY (*)    Squamous Epithelial / LPF 0-5 (*)    All other components within normal limits  CULTURE, BLOOD (ROUTINE X 2)  CULTURE, BLOOD (ROUTINE X 2)  URINE CULTURE  I-STAT CG4 LACTIC ACID, ED  I-STAT TROPOININ, ED    Medications - No data to display  Vitals:   02/16/17 1413 02/16/17 1500 02/16/17 1515 02/16/17 1700  BP:  (!) 121/59 (!) 125/56 135/64  Pulse:  74 72 74  Resp:  12 20   Temp:      TempSrc:      SpO2:  100% 100% 99%  Weight: 76.2 kg (168 lb)     Height: 5\' 10"  (1.778 m)       Final diagnoses:  Eleonore Chiquito, MD 02/16/17 Einar Crow

## 2017-02-16 NOTE — ED Provider Notes (Signed)
[facility] DEPT Provider Note   CSN: [id number] Arrival date & time: 02/16/17  1358     History   Chief Complaint Chief Complaint  Patient presents with  . Hypotension  . orthostatic    HPI James GENTHER is a 81 y.o. male.  HPI Patient with a witnessed unresponsive episode. Found beside bed in the floor by the wife. Patient was unresponsive initially but then regained consciousness. Was having difficulty getting up out of the floor. EMS was called. Patient had single episode when being helped to stand up. Noted to be bradycardic and hypotensive. Patient has since been at his mental baseline. Denies any new weakness or numbness. Recently admitted for stroke with left-sided upper and lower extremity weakness. States he's been taking medication as prescribed. Past Medical History:  Diagnosis Date  . Atrial fibrillation ([place])   . Chronic kidney disease, stage IV (severe) (HCC)    baseline creatinine 2-3  . DDD (degenerative disc disease)   . Diabetes mellitus without complication ([place])   . ESRD (end stage renal disease) on dialysis ([place])   . Hyperlipidemia   . Hypertension   . Melanoma ([place]) 2010   removed at [facility]  . Second degree heart block    a. s/p STJ dual chamber PPM - Dr Allred    Patient Active Problem List   Diagnosis Date Noted  . Gait disturbance, post-stroke   . Subcortical infarction ([place]) 02/06/2017  . Intracranial vascular stenosis 02/04/2017  . Syncope   . Orthostatic hypotension   . Stroke ([place]) 02/01/2017  . TIA (transient ischemic attack) 01/29/2017  . Hypertension 01/29/2017  . CKD (chronic kidney disease) stage V requiring chronic dialysis ([place]) 01/29/2017  . DDD (degenerative disc disease) 01/29/2017  . HLD (hyperlipidemia) 01/29/2017  . Second degree heart block 01/29/2017  . Atrial flutter ([place]) 01/29/2017  . ESRD (end stage renal disease) on dialysis ([place])   . Melanoma of skin ([place]) 03/13/2016  . Atrial flutter ([place]) 06/26/2015  .  Pacemaker 10/13/2014  . End stage renal disease ([place]) 07/15/2014  . Chronic diastolic heart failure ([place]) 03/24/2014  . Second-degree heart block 12/13/2013  . Chronic kidney disease, stage IV (severe) ([place]) 12/13/2013  . Essential hypertension 12/13/2013  . Diabetes mellitus due to underlying condition with diabetic nephropathy ([place]) 12/13/2013  . Hyperlipidemia 12/13/2013    Past Surgical History:  Procedure Laterality Date  . AV FISTULA PLACEMENT Right 05/31/2014   Procedure: Right Arm Brachiocephalic ARTERIOVENOUS FISTULA CREATION  ;  Surgeon: Conrad Meadow Woods, MD;  Location: [place];  Service: Vascular;  Laterality: Right;  . CATARACT EXTRACTION W/ INTRAOCULAR LENS  IMPLANT, BILATERAL    . COLONOSCOPY    . EXCISION MELANOMA WITH SENTINEL LYMPH NODE BIOPSY Right 02/08/2016   Procedure: WIDE EXCISION RIGHT SHOULDER MELANOMA WITH RIGHT SENTINEL LYMPH NODE BIOPSY;  Surgeon: [name], MD;  Location: [place];  Service: General;  Laterality: Right;  . LAMINECTOMY    . PERMANENT PACEMAKER INSERTION N/A 01/20/2014   STJ Assurity dual chamber pacemaker implanted by Dr [name] for 2nd degree AV block       Home Medications    Prior to Admission medications   Medication Sig Start Date End Date Taking? Authorizing Provider  acetaminophen (TYLENOL) 500 MG tablet Take 500 mg by mouth daily as needed for mild pain. (pain/headaches)   Yes [provider]  aspirin EC 81 MG tablet Take 81 mg by mouth at bedtime.   Yes [provider]  calcium acetate (PHOSLO)  667 MG capsule Take 2 capsules (1,334 mg total) by mouth 3 (three) times daily. 02/14/17  Yes Angiulli, Lavon Paganini, PA-C  carvedilol (COREG) 3.125 MG tablet Take 1 tablet (3.125 mg total) by mouth 2 (two) times daily with a meal. 02/14/17  Yes Angiulli, Lavon Paganini, PA-C  ezetimibe (ZETIA) 10 MG tablet Take 1 tablet (10 mg total) by mouth daily. 02/14/17  Yes Angiulli, Lavon Paganini, PA-C  fenofibrate 160 MG tablet Take 1 tablet (160 mg total)  by mouth daily. 02/14/17  Yes Angiulli, Lavon Paganini, PA-C  finasteride (PROSCAR) 5 MG tablet Take 1 tablet (5 mg total) by mouth daily. 02/14/17  Yes Angiulli, Lavon Paganini, PA-C  gabapentin (NEURONTIN) 100 MG capsule Take 1 capsule (100 mg total) by mouth at bedtime. 02/14/17  Yes Angiulli, Lavon Paganini, PA-C  glimepiride (AMARYL) 1 MG tablet Take 1 tablet (1 mg total) by mouth daily with breakfast. 02/14/17  Yes Angiulli, Lavon Paganini, PA-C  Hypromellose (ARTIFICIAL TEARS OP) Place 1 drop into both eyes daily as needed (supplement).   Yes [provider]  ipratropium (ATROVENT) 0.03 % nasal spray Place 2 sprays into both nostrils daily.    Yes [provider]  rOPINIRole (REQUIP) 0.5 MG tablet Take 1 tablet (0.5 mg total) by mouth at bedtime. 02/14/17  Yes Angiulli, Lavon Paganini, PA-C  senna-docusate (SENOKOT-S) 8.6-50 MG tablet Take 2 tablets by mouth at bedtime. 02/14/17  Yes Angiulli, Lavon Paganini, PA-C  warfarin (COUMADIN) 1 MG tablet 1 1/2 tab daily Patient taking differently: Take 1.5 mg by mouth daily at 6 PM.  02/14/17  Yes Angiulli, Lavon Paganini, PA-C  ACCU-CHEK AVIVA PLUS test strip  02/08/14   [provider]  Amino Acids-Protein Hydrolys (FEEDING SUPPLEMENT, PRO-STAT SUGAR FREE 64,) LIQD Take 30 mLs by mouth 2 (two) times daily. Patient not taking: Reported on 02/16/2017 02/06/17   [name] Fire, MD  multivitamin (RENA-VIT) TABS tablet Take 1 tablet by mouth at bedtime. 02/14/17   Angiulli, Lavon Paganini, PA-C    Family History Family History  Problem Relation Age of Onset  . Pulmonary embolism Mother   . Diabetes Mother   . Heart attack Father   . Diabetes Brother     Social History Social History  Substance Use Topics  . Smoking status: Never Smoker  . Smokeless tobacco: Never Used  . Alcohol use No     Allergies   Amlodipine besylate; Lisinopril; and Statins   Review of Systems Review of Systems  Constitutional: Negative for chills and fever.  Respiratory: Negative for cough  and shortness of breath.   Cardiovascular: Negative for chest pain, palpitations and leg swelling.  Gastrointestinal: Negative for abdominal pain, diarrhea, nausea and vomiting.  Genitourinary: Negative for dysuria, flank pain and frequency.  Musculoskeletal: Negative for back pain, myalgias, neck pain and neck stiffness.  Neurological: Positive for weakness. Negative for numbness and headaches.  All other systems reviewed and are negative.    Physical Exam Updated Vital Signs BP (!) 125/56 (BP Location: Left Arm)   Pulse 72   Temp 97.4 F (36.3 C) (Oral)   Resp 20   Ht 5\' 10"  (1.778 m)   Wt 76.2 kg (168 lb)   SpO2 100%   BMI 24.11 kg/m   Physical Exam  Constitutional: He is oriented to person, place, and time. He appears well-developed and well-nourished. No distress.  HENT:  Head: Normocephalic and atraumatic.  Mouth/Throat: Oropharynx is clear and moist. No oropharyngeal exudate.  No obvious head trauma  Eyes: EOM are normal. Pupils are equal, round, and reactive to light.  Neck: Normal range of motion. Neck supple.  No posterior midline cervical tenderness to palpation.  Cardiovascular: Normal rate and regular rhythm.  Exam reveals no gallop and no friction rub.   No murmur heard. Pulmonary/Chest: Effort normal and breath sounds normal. No respiratory distress. He has no wheezes. He has no rales. He exhibits no tenderness.  Abdominal: Soft. Bowel sounds are normal. There is no tenderness. There is no rebound and no guarding.  Musculoskeletal: Normal range of motion. He exhibits no edema or tenderness.  Neurological: He is alert and oriented to person, place, and time.  5/5 motor in right upper and right lower extremity. 4/5 motor in left upper and lower extremity. Sensation intact. Cranial nerves II through XII grossly intact  Skin: Skin is warm and dry. Capillary refill takes less than 2 seconds. No rash noted. No erythema.  Psychiatric: He has a normal mood and affect.  His behavior is normal.  Nursing note and vitals reviewed.    ED Treatments / Results  Labs (all labs ordered are listed, but only abnormal results are displayed) Labs Reviewed  COMPREHENSIVE METABOLIC PANEL - Abnormal; Notable for the following:       Result Value   Sodium 131 (*)    Chloride 96 (*)    Glucose, Bld 197 (*)    BUN 57 (*)    Creatinine, Ser 6.61 (*)    Total Protein 5.6 (*)    Albumin 3.2 (*)    GFR calc non Af Amer 7 (*)    GFR calc Af Amer 8 (*)    All other components within normal limits  CBC WITH DIFFERENTIAL/PLATELET - Abnormal; Notable for the following:    RBC 3.33 (*)    Hemoglobin 10.2 (*)    HCT 31.3 (*)    All other components within normal limits  PROTIME-INR - Abnormal; Notable for the following:    Prothrombin Time 24.1 (*)    All other components within normal limits  CULTURE, BLOOD (ROUTINE X 2)  CULTURE, BLOOD (ROUTINE X 2)  URINALYSIS, ROUTINE W REFLEX MICROSCOPIC  I-STAT CG4 LACTIC ACID, ED  I-STAT TROPOININ, ED    EKG  EKG Interpretation  Date/Time:  Sunday February 16 2017 14:10:22 EDT Ventricular Rate:  73 PR Interval:    QRS Duration: 183 QT Interval:  495 QTC Calculation: 546 R Axis:   -86 Text Interpretation:  Sinus rhythm IVCD, consider atypical RBBB Left ventricular hypertrophy Anterior infarct, old Lateral leads are also involved Probable RV involvement, suggest recording right precordial leads Confirmed by Lita Mains  MD, Ondria Oswald ([id number]) on 02/16/2017 2:18:04 PM       Radiology Dg Chest Portable 1 View  Result Date: 02/16/2017 CLINICAL DATA:  Weakness. EXAM: PORTABLE CHEST 1 VIEW COMPARISON:  01/29/2017 FINDINGS: 1453 hours. Lungs are hyperexpanded. The lungs are clear wiithout focal pneumonia, edema, pneumothorax or pleural effusion. The cardio pericardial silhouette is enlarged. Left-sided permanent pacemaker again noted. Vascular stent device noted right subclavian region. Surgical clips evident in the left axilla.  IMPRESSION: Cardiomegaly without acute cardiopulmonary findings. Electronically Signed   By: Misty Stanley M.D.   On: 02/16/2017 15:09    Procedures Procedures (including critical care time)  Medications Ordered in ED Medications - No data to display   Initial Impression / Assessment and Plan / ED Course  I have reviewed the triage vital signs and the nursing notes.  Pertinent labs & imaging results  that were available during my care of the patient were reviewed by me and considered in my medical decision making (see chart for details).     Only at his baseline neurologic status. Blood pressure is stable in the emergency department. Will get CT head without intracranial pathology. Signed out to oncoming emergency physician pending completion of workup and reevaluation.  Final Clinical Impressions(s) / ED Diagnoses   Final diagnoses:  Orthostasis    New Prescriptions New Prescriptions   No medications on file     Julianne Rice, MD 02/16/17 1643

## 2017-02-16 NOTE — ED Notes (Signed)
ED Provider at bedside. 

## 2017-02-16 NOTE — Discharge Instructions (Signed)
Stay well hydrated, stand up slowly and sit down if you feel lightheaded. Discuss meds with primary doctor.   If you were given medicines take as directed.  If you are on coumadin or contraceptives realize their levels and effectiveness is altered by many different medicines.  If you have any reaction (rash, tongues swelling, other) to the medicines stop taking and see a physician.    If your blood pressure was elevated in the ER make sure you follow up for management with a primary doctor or return for chest pain, shortness of breath or stroke symptoms.  Please follow up as directed and return to the ER or see a physician for new or worsening symptoms.  Thank you. Vitals:   02/16/17 1407 02/16/17 1413 02/16/17 1500 02/16/17 1515  BP: 120/61  (!) 121/59 (!) 125/56  Pulse: 70  74 72  Resp: 20  12 20   Temp: 97.4 F (36.3 C)     TempSrc: Oral     SpO2: 97%  100% 100%  Weight:  76.2 kg (168 lb)    Height:  5\' 10"  (1.778 m)

## 2017-02-16 NOTE — ED Notes (Signed)
Ambulated in with walker. Pt had slow and steady gait. Pt denies dizziness or other symptoms.

## 2017-02-16 NOTE — ED Triage Notes (Signed)
Pt to ED via [facility] from home with c/o weakness and orthostatic -- was discharged from [facility] yesterday- had a stroke 5/16. On EMS arrival- pt initial pressure was 88/50, pt became unresponsive when standing up for EMS-- received 500cc NS- pressure came up to 100/60. On arrival to ED- pt awake, alert, pale, w/d.  Orthostatic BP laying 109/52  P 69                    BP sitting 110/56 P 75

## 2017-02-16 NOTE — ED Notes (Signed)
Portable xray at bedside.

## 2017-02-17 ENCOUNTER — Inpatient Hospital Stay (HOSPITAL_COMMUNITY): Payer: Medicare Other | Admitting: Occupational Therapy

## 2017-02-18 LAB — URINE CULTURE

## 2017-02-19 ENCOUNTER — Other Ambulatory Visit: Payer: Self-pay

## 2017-02-19 ENCOUNTER — Encounter (HOSPITAL_COMMUNITY): Payer: Self-pay | Admitting: Emergency Medicine

## 2017-02-19 ENCOUNTER — Emergency Department (HOSPITAL_COMMUNITY)
Admission: EM | Admit: 2017-02-19 | Discharge: 2017-02-20 | Disposition: A | Discharge status: Home / Self Care | Payer: Medicare Other | Attending: Emergency Medicine | Admitting: Emergency Medicine

## 2017-02-19 ENCOUNTER — Emergency Department (HOSPITAL_COMMUNITY): Payer: Medicare Other

## 2017-02-19 ENCOUNTER — Telehealth: Payer: Self-pay | Admitting: *Deleted

## 2017-02-19 DIAGNOSIS — E1122 Type 2 diabetes mellitus with diabetic chronic kidney disease: Secondary | ICD-10-CM | POA: Insufficient documentation

## 2017-02-19 DIAGNOSIS — I132 Hypertensive heart and chronic kidney disease with heart failure and with stage 5 chronic kidney disease, or end stage renal disease: Secondary | ICD-10-CM | POA: Diagnosis not present

## 2017-02-19 DIAGNOSIS — Z85820 Personal history of malignant melanoma of skin: Secondary | ICD-10-CM | POA: Insufficient documentation

## 2017-02-19 DIAGNOSIS — Z8673 Personal history of transient ischemic attack (TIA), and cerebral infarction without residual deficits: Secondary | ICD-10-CM | POA: Diagnosis not present

## 2017-02-19 DIAGNOSIS — N186 End stage renal disease: Secondary | ICD-10-CM | POA: Insufficient documentation

## 2017-02-19 DIAGNOSIS — Z7982 Long term (current) use of aspirin: Secondary | ICD-10-CM | POA: Insufficient documentation

## 2017-02-19 DIAGNOSIS — R531 Weakness: Secondary | ICD-10-CM | POA: Diagnosis not present

## 2017-02-19 DIAGNOSIS — Z7901 Long term (current) use of anticoagulants: Secondary | ICD-10-CM | POA: Insufficient documentation

## 2017-02-19 DIAGNOSIS — I4891 Unspecified atrial fibrillation: Secondary | ICD-10-CM | POA: Diagnosis not present

## 2017-02-19 DIAGNOSIS — Z950 Presence of cardiac pacemaker: Secondary | ICD-10-CM | POA: Diagnosis not present

## 2017-02-19 DIAGNOSIS — I5032 Chronic diastolic (congestive) heart failure: Secondary | ICD-10-CM | POA: Insufficient documentation

## 2017-02-19 DIAGNOSIS — Z79899 Other long term (current) drug therapy: Secondary | ICD-10-CM | POA: Insufficient documentation

## 2017-02-19 LAB — COMPREHENSIVE METABOLIC PANEL
ALK PHOS: 40 U/L (ref 38–126)
ALT: 22 U/L (ref 17–63)
ANION GAP: 8 (ref 5–15)
AST: 21 U/L (ref 15–41)
Albumin: 2.9 g/dL — ABNORMAL LOW (ref 3.5–5.0)
BILIRUBIN TOTAL: 0.7 mg/dL (ref 0.3–1.2)
BUN: 18 mg/dL (ref 6–20)
CALCIUM: 8.1 mg/dL — AB (ref 8.9–10.3)
CO2: 30 mmol/L (ref 22–32)
Chloride: 95 mmol/L — ABNORMAL LOW (ref 101–111)
Creatinine, Ser: 4.29 mg/dL — ABNORMAL HIGH (ref 0.61–1.24)
GFR calc non Af Amer: 12 mL/min — ABNORMAL LOW (ref >60)
GFR, EST AFRICAN AMERICAN: 14 mL/min — AB (ref >60)
Glucose, Bld: 132 mg/dL — ABNORMAL HIGH (ref 65–99)
Potassium: 3.1 mmol/L — ABNORMAL LOW (ref 3.5–5.1)
Sodium: 133 mmol/L — ABNORMAL LOW (ref 135–145)
TOTAL PROTEIN: 5.1 g/dL — AB (ref 6.5–8.1)

## 2017-02-19 LAB — CBC WITH DIFFERENTIAL/PLATELET
Basophils Absolute: 0 10*3/uL (ref 0.0–0.1)
Basophils Relative: 0 %
EOS ABS: 0.1 10*3/uL (ref 0.0–0.7)
Eosinophils Relative: 2 %
HCT: 29.5 % — ABNORMAL LOW (ref 39.0–52.0)
HEMOGLOBIN: 9.5 g/dL — AB (ref 13.0–17.0)
LYMPHS ABS: 0.7 10*3/uL (ref 0.7–4.0)
Lymphocytes Relative: 14 %
MCH: 30.2 pg (ref 26.0–34.0)
MCHC: 32.2 g/dL (ref 30.0–36.0)
MCV: 93.7 fL (ref 78.0–100.0)
MONO ABS: 0.6 10*3/uL (ref 0.1–1.0)
MONOS PCT: 12 %
NEUTROS PCT: 72 %
Neutro Abs: 4 10*3/uL (ref 1.7–7.7)
Platelets: 193 10*3/uL (ref 150–400)
RBC: 3.15 MIL/uL — ABNORMAL LOW (ref 4.22–5.81)
RDW: 14.1 % (ref 11.5–15.5)
WBC: 5.5 10*3/uL (ref 4.0–10.5)

## 2017-02-19 LAB — CBG MONITORING, ED: Glucose-Capillary: 138 mg/dL — ABNORMAL HIGH (ref 65–99)

## 2017-02-19 MED ORDER — SODIUM CHLORIDE 0.9 % IV BOLUS (SEPSIS)
500.0000 mL | Freq: Once | INTRAVENOUS | Status: AC
  Administered 2017-02-19: 500 mL via INTRAVENOUS

## 2017-02-19 NOTE — Discharge Instructions (Signed)
Please return without fail for worsening symptoms, including confusion, numbness/weakness, fever, vomiting, or any other symptoms concerning to you. You have been set up with additional services through home health.

## 2017-02-19 NOTE — Progress Notes (Signed)
CSW spoke with Helene Kelp at [facility] and pt will not qualify for additional SNF until 60 or more days pass.  CM stated to RN pt will have [facility], RN 3 X weekly, PT/OT 3 X weekly, Home Health and a nurses aide once D/C'd.  CSW offered pt's wife Dung Salinger option of stay at [facility] for up to 30 days at 167.50 per day (co-pay) that will allow the pt to be transported to dialysis 3 X weekly, but pt's wife said no, pt will go home.    CSW will update pt's wife, RN and EDP.  Alphonse Guild. Jeanine Caven, Latanya Presser, LCAS Clinical Social Worker Ph: [phone]

## 2017-02-19 NOTE — NC FL2 (Signed)
[facility] LEVEL OF CARE SCREENING TOOL     IDENTIFICATION  Patient Name: James David Birthdate: [birth year] Sex: male Admission Date (Current Location): 02/19/2017  [facility] and [place] Number:  Herbalist and Address:  [facility], [place] [address], [place],  [address]      Provider Number: [id number]  Attending Physician Name and Address:  Forde Dandy, MD  Relative Name and Phone Number:       Current Level of Care: Hospital Recommended Level of Care: [place] Prior Approval Number:    Date Approved/Denied:   PASRR Number:    Discharge Plan: SNF    Current Diagnoses: Patient Active Problem List   Diagnosis Date Noted  . Gait disturbance, post-stroke   . Subcortical infarction ([place]) 02/06/2017  . Intracranial vascular stenosis 02/04/2017  . Syncope   . Orthostatic hypotension   . Stroke ([place]) 02/01/2017  . TIA (transient ischemic attack) 01/29/2017  . Hypertension 01/29/2017  . CKD (chronic kidney disease) stage V requiring chronic dialysis ([place]) 01/29/2017  . DDD (degenerative disc disease) 01/29/2017  . HLD (hyperlipidemia) 01/29/2017  . Second degree heart block 01/29/2017  . Atrial flutter ([place]) 01/29/2017  . ESRD (end stage renal disease) on dialysis ([place])   . Melanoma of skin ([place]) 03/13/2016  . Atrial flutter ([place]) 06/26/2015  . Pacemaker 10/13/2014  . End stage renal disease ([place]) 07/15/2014  . Chronic diastolic heart failure ([place]) 03/24/2014  . Second-degree heart block 12/13/2013  . Chronic kidney disease, stage IV (severe) ([place]) 12/13/2013  . Essential hypertension 12/13/2013  . Diabetes mellitus due to underlying condition with diabetic nephropathy ([place]) 12/13/2013  . Hyperlipidemia 12/13/2013    Orientation RESPIRATION BLADDER Height & Weight     Self, Time, Situation, Place  Normal Continent Weight: 168 lb (76.2 kg) Height:  5\' 10"  (177.8 cm)  BEHAVIORAL SYMPTOMS/MOOD  NEUROLOGICAL BOWEL NUTRITION STATUS      Continent  (Fluid restricted due to dialysis 3 times a week)  AMBULATORY STATUS COMMUNICATION OF NEEDS Skin   Limited Assist Verbally Normal (Minor skin tears on pt's left arm with dressing)                       Personal Care Assistance Level of Assistance  Bathing, Dressing Bathing Assistance: Limited assistance Feeding assistance: Independent Dressing Assistance: Limited assistance     Functional Limitations Info    Sight Info: Adequate Hearing Info: Adequate Speech Info: Adequate    SPECIAL CARE FACTORS FREQUENCY        PT Frequency: 5 OT Frequency: 5            Contractures Contractures Info: Not present    Additional Factors Info    Code Status Info: Prior Allergies Info: Amlodipine Besylate, Lisinopril, Statins           Current Medications (02/19/2017):  This is the current hospital active medication list No current facility-administered medications for this encounter.    Current Outpatient Prescriptions  Medication Sig Dispense Refill  . acetaminophen (TYLENOL) 500 MG tablet Take 500 mg by mouth daily as needed for mild pain. (pain/headaches)    . aspirin EC 81 MG tablet Take 81 mg by mouth at bedtime.    . calcium acetate (PHOSLO) 667 MG capsule Take 2 capsules (1,334 mg total) by mouth 3 (three) times daily. 180 capsule 0  . carvedilol (COREG) 3.125 MG tablet Take 1 tablet (3.125 mg total) by  mouth 2 (two) times daily with a meal. 60 tablet 1  . cephALEXin (KEFLEX) 500 MG capsule Take 1 capsule (500 mg total) by mouth 2 (two) times daily. 14 capsule 0  . ezetimibe (ZETIA) 10 MG tablet Take 1 tablet (10 mg total) by mouth daily. 30 tablet 1  . fenofibrate 160 MG tablet Take 1 tablet (160 mg total) by mouth daily. 30 tablet 0  . finasteride (PROSCAR) 5 MG tablet Take 1 tablet (5 mg total) by mouth daily. 30 tablet 1  . gabapentin (NEURONTIN) 100 MG capsule Take 1 capsule (100 mg total) by mouth at bedtime. 30  capsule 1  . glimepiride (AMARYL) 1 MG tablet Take 1 tablet (1 mg total) by mouth daily with breakfast. 30 tablet 1  . Hypromellose (ARTIFICIAL TEARS OP) Place 1 drop into both eyes daily as needed (supplement).    Marland Kitchen ipratropium (ATROVENT) 0.03 % nasal spray Place 2 sprays into both nostrils daily.     . multivitamin (RENA-VIT) TABS tablet Take 1 tablet by mouth at bedtime. 30 tablet 0  . rOPINIRole (REQUIP) 0.5 MG tablet Take 1 tablet (0.5 mg total) by mouth at bedtime. 30 tablet 1  . senna-docusate (SENOKOT-S) 8.6-50 MG tablet Take 2 tablets by mouth at bedtime.    Marland Kitchen warfarin (COUMADIN) 1 MG tablet 1 1/2 tab daily (Patient taking differently: Take 1.5 mg by mouth daily at 6 PM. ) 30 tablet 1     Discharge Medications: Please see discharge summary for a list of discharge medications.  Relevant Imaging Results:  Relevant Lab Results:   Additional Information [id number]  Alphonse Guild Vashaun Osmon, LCSWA

## 2017-02-19 NOTE — Telephone Encounter (Signed)
Post ED Visit - Positive Culture Follow-up  Culture report reviewed by antimicrobial stewardship pharmacist:  [x]  Elenor Quinones, Pharm.D. []  Heide Guile, Pharm.D., BCPS AQ-ID []  Parks Neptune, Pharm.D., BCPS []  Alycia Rossetti, Pharm.D., BCPS []  [place], [place].D., BCPS, AAHIVP []  Legrand Como, Pharm.D., BCPS, AAHIVP []  Salome Arnt, PharmD, BCPS []  Dimitri Ped, PharmD, BCPS []  Vincenza Hews, PharmD, BCPS  Positive urine culture Treated with Cephalexin, organism sensitive to the same and no further patient follow-up is required at this time.  James David 02/19/2017, 11:22 AM

## 2017-02-19 NOTE — Care Management (Signed)
ED CM received consult from Dr. Oleta Mouse concerning Rehab placement, CM notified covering ED CSW

## 2017-02-19 NOTE — Care Management (Signed)
ED CM discussed with ED CSW possible options for transitional care from ED. CSW will evaluate for SNF vs CM arranging [place] services. Patient was discharged from [facility] 6/2 set up with [facility], but services have not started yet. Patient is on HD and has been increasingly becoming weaker as per spouse. CM also discussed with EDP Dr. Oleta Mouse that if patient is does not meet criteria for Rehab increasing [facility] services by adding additional disciplines to assist with reconditioning. Dr. Oleta Mouse is agreeable. CM will continue to follow up

## 2017-02-19 NOTE — ED Triage Notes (Signed)
Pt in from dialysis center (finished treatment today) via [facility] with c/o generalized weakness x 2 days. Per EMS, pt recently hospitalized x 3 wks for stroke, no residual deficits when d/c'd home. On 6/4, pt went to ED and UTI was found. Pt d/c'd on PO abx, has been taking q day. Family/dialysis states pt is weaker today, able to stand/pivot for EMS. CBG was initially 67 > oral glucose > 82. BP 110/58, awake, a&ox4, has [employer] pacemaker

## 2017-02-19 NOTE — ED Provider Notes (Signed)
[facility] DEPT Provider Note   CSN: [id number] Arrival date & time: 02/19/17  1719     History   Chief Complaint Chief Complaint  Patient presents with  . Weakness    HPI James David is a 81 y.o. male.  The history is provided by the patient.  Weakness  Primary symptoms include loss of balance.  Primary symptoms include no focal weakness, no loss of sensation, no speech change, no memory loss, no movement disorder, no visual change, no auditory change, and no dizziness. This is a new problem. The current episode started more than 2 days ago. The problem has not changed since onset.There was no focality noted. There has been no fever. Pertinent negatives include no shortness of breath, no chest pain, no vomiting, no altered mental status, no confusion and no headaches. There were no medications administered prior to arrival. Associated medical issues include CVA. Associated medical issues do not include trauma, a bleeding disorder, seizures, dementia or a clotting disorder.    81 year old male who presents with generalized weakness. Patient just discharged from the hospital after rehabilitation for subcortical versus brainstem stroke. Since discharge from hospital, patient has been nonambulatory and unable to ambulate. He has had decreased by mouth intake. Was seen in the ED 3 days ago for these symptoms, and had a catheterized urine showing potential UTI. Started on Keflex, patient not improved. No fevers, confusion, nausea or vomiting, abdominal pain, diarrhea, chest pain, difficulty breathing, cough. History of atrial fibrillation with Coumadin and end-stage renal disease on hemodialysis. Last dialysis was today.  Past Medical History:  Diagnosis Date  . Atrial fibrillation ([place])   . Chronic kidney disease, stage IV (severe) (HCC)    baseline creatinine 2-3  . DDD (degenerative disc disease)   . Diabetes mellitus without complication ([place])   . ESRD (end stage renal disease) on  dialysis ([place])   . Hyperlipidemia   . Hypertension   . Melanoma ([place]) 2010   removed at [facility]  . Second degree heart block    a. s/p STJ dual chamber PPM - Dr Allred    Patient Active Problem List   Diagnosis Date Noted  . Gait disturbance, post-stroke   . Subcortical infarction ([place]) 02/06/2017  . Intracranial vascular stenosis 02/04/2017  . Syncope   . Orthostatic hypotension   . Stroke ([place]) 02/01/2017  . TIA (transient ischemic attack) 01/29/2017  . Hypertension 01/29/2017  . CKD (chronic kidney disease) stage V requiring chronic dialysis ([place]) 01/29/2017  . DDD (degenerative disc disease) 01/29/2017  . HLD (hyperlipidemia) 01/29/2017  . Second degree heart block 01/29/2017  . Atrial flutter ([place]) 01/29/2017  . ESRD (end stage renal disease) on dialysis ([place])   . Melanoma of skin ([place]) 03/13/2016  . Atrial flutter ([place]) 06/26/2015  . Pacemaker 10/13/2014  . End stage renal disease ([place]) 07/15/2014  . Chronic diastolic heart failure ([place]) 03/24/2014  . Second-degree heart block 12/13/2013  . Chronic kidney disease, stage IV (severe) ([place]) 12/13/2013  . Essential hypertension 12/13/2013  . Diabetes mellitus due to underlying condition with diabetic nephropathy ([place]) 12/13/2013  . Hyperlipidemia 12/13/2013    Past Surgical History:  Procedure Laterality Date  . AV FISTULA PLACEMENT Right 05/31/2014   Procedure: Right Arm Brachiocephalic ARTERIOVENOUS FISTULA CREATION  ;  Surgeon: Conrad Maplewood, MD;  Location: [facility];  Service: Vascular;  Laterality: Right;  . CATARACT EXTRACTION W/ INTRAOCULAR LENS  IMPLANT, BILATERAL    . COLONOSCOPY    . EXCISION MELANOMA WITH  SENTINEL LYMPH NODE BIOPSY Right 02/08/2016   Procedure: WIDE EXCISION RIGHT SHOULDER MELANOMA WITH RIGHT SENTINEL LYMPH NODE BIOPSY;  Surgeon: [name], MD;  Location: [facility];  Service: General;  Laterality: Right;  . LAMINECTOMY    . PERMANENT PACEMAKER INSERTION N/A 01/20/2014   STJ Assurity dual chamber  pacemaker implanted by Dr [name] for 2nd degree AV block       Home Medications    Prior to Admission medications   Medication Sig Start Date End Date Taking? Authorizing Provider  acetaminophen (TYLENOL) 500 MG tablet Take 500 mg by mouth daily as needed for mild pain. (pain/headaches)   Yes [provider]  aspirin EC 81 MG tablet Take 81 mg by mouth at bedtime.   Yes [provider]  calcium acetate (PHOSLO) 667 MG capsule Take 2 capsules (1,334 mg total) by mouth 3 (three) times daily. 02/14/17  Yes Angiulli, Lavon Paganini, PA-C  carvedilol (COREG) 3.125 MG tablet Take 1 tablet (3.125 mg total) by mouth 2 (two) times daily with a meal. 02/14/17  Yes Angiulli, Lavon Paganini, PA-C  cephALEXin (KEFLEX) 500 MG capsule Take 1 capsule (500 mg total) by mouth 2 (two) times daily. 02/16/17  Yes Elnora Morrison, MD  ezetimibe (ZETIA) 10 MG tablet Take 1 tablet (10 mg total) by mouth daily. 02/14/17  Yes Angiulli, Lavon Paganini, PA-C  fenofibrate 160 MG tablet Take 1 tablet (160 mg total) by mouth daily. 02/14/17  Yes Angiulli, Lavon Paganini, PA-C  finasteride (PROSCAR) 5 MG tablet Take 1 tablet (5 mg total) by mouth daily. 02/14/17  Yes Angiulli, Lavon Paganini, PA-C  gabapentin (NEURONTIN) 100 MG capsule Take 1 capsule (100 mg total) by mouth at bedtime. 02/14/17  Yes Angiulli, Lavon Paganini, PA-C  glimepiride (AMARYL) 1 MG tablet Take 1 tablet (1 mg total) by mouth daily with breakfast. 02/14/17  Yes Angiulli, Lavon Paganini, PA-C  Hypromellose (ARTIFICIAL TEARS OP) Place 1 drop into both eyes daily as needed (supplement).   Yes [provider]  ipratropium (ATROVENT) 0.03 % nasal spray Place 2 sprays into both nostrils daily.    Yes [provider]  multivitamin (RENA-VIT) TABS tablet Take 1 tablet by mouth at bedtime. 02/14/17  Yes Angiulli, Lavon Paganini, PA-C  rOPINIRole (REQUIP) 0.5 MG tablet Take 1 tablet (0.5 mg total) by mouth at bedtime. 02/14/17  Yes Angiulli, Lavon Paganini, PA-C  senna-docusate (SENOKOT-S) 8.6-50 MG  tablet Take 2 tablets by mouth at bedtime. 02/14/17  Yes Angiulli, Lavon Paganini, PA-C  warfarin (COUMADIN) 1 MG tablet 1 1/2 tab daily Patient taking differently: Take 1.5 mg by mouth daily at 6 PM.  02/14/17  Yes Angiulli, Lavon Paganini, PA-C    Family History Family History  Problem Relation Age of Onset  . Pulmonary embolism Mother   . Diabetes Mother   . Heart attack Father   . Diabetes Brother     Social History Social History  Substance Use Topics  . Smoking status: Never Smoker  . Smokeless tobacco: Never Used  . Alcohol use No     Allergies   Amlodipine besylate; Lisinopril; and Statins   Review of Systems Review of Systems  Constitutional: Positive for fatigue. Negative for fever.  Respiratory: Negative for shortness of breath.   Cardiovascular: Negative for chest pain.  Gastrointestinal: Negative for vomiting.  Neurological: Positive for weakness and loss of balance. Negative for dizziness, speech change, focal weakness and headaches.  Hematological: Bruises/bleeds easily.  Psychiatric/Behavioral: Negative for confusion and memory loss.  All  other systems reviewed and are negative.    Physical Exam Updated Vital Signs BP (!) 118/57   Pulse 87   Temp 98.1 F (36.7 C) (Oral)   Resp (!) 28   Ht 5\' 10"  (1.778 m)   Wt 76.2 kg (168 lb)   SpO2 100%   BMI 24.11 kg/m   Physical Exam Physical Exam  Nursing note and vitals reviewed. Constitutional: non-toxic, and in no acute distress Head: Normocephalic and atraumatic.  Mouth/Throat: Oropharynx is clear and moist.  Neck: Normal range of motion. Neck supple.  Cardiovascular: Normal rate and regular rhythm.   Pulmonary/Chest: Effort normal and breath sounds normal.  Abdominal: Soft. There is no tenderness. There is no rebound and no guarding.  Musculoskeletal: Normal range of motion.  Skin: Skin is warm and dry.  Psychiatric: Cooperative Neurological:  Alert, oriented to person, place, time, and situation. Memory  grossly in tact. Fluent speech. No dysarthria or aphasia.  Cranial nerves: EOMI without nystagmus. No gaze deviation. Facial muscles symmetric with activation. Sensation to light touch over face in tact bilaterally. Hearing grossly in tact. Palate elevates symmetrically. Head turn and shoulder shrug are intact. Tongue midline.  Reflexes defered.  Muscle bulk and tone normal. No pronator drift. Moves all extremities symmetrically. Sensation to light touch is in tact throughout in bilateral upper and lower extremities. Coordination reveals no dysmetria with finger to nose.     ED Treatments / Results  Labs (all labs ordered are listed, but only abnormal results are displayed) Labs Reviewed  CBC WITH DIFFERENTIAL/PLATELET - Abnormal; Notable for the following:       Result Value   RBC 3.15 (*)    Hemoglobin 9.5 (*)    HCT 29.5 (*)    All other components within normal limits  COMPREHENSIVE METABOLIC PANEL - Abnormal; Notable for the following:    Sodium 133 (*)    Potassium 3.1 (*)    Chloride 95 (*)    Glucose, Bld 132 (*)    Creatinine, Ser 4.29 (*)    Calcium 8.1 (*)    Total Protein 5.1 (*)    Albumin 2.9 (*)    GFR calc non Af Amer 12 (*)    GFR calc Af Amer 14 (*)    All other components within normal limits  CBG MONITORING, ED - Abnormal; Notable for the following:    Glucose-Capillary 138 (*)    All other components within normal limits    EKG  EKG Interpretation None       Radiology Dg Chest 2 View  Result Date: 02/19/2017 CLINICAL DATA:  Generalized weakness EXAM: CHEST  2 VIEW COMPARISON:  02/16/2017 chest radiograph. FINDINGS: Left axillary surgical clips and right subclavian/axillary vascular stent are again noted. Stable configuration of 2 lead left subclavian pacemaker. Stable cardiomediastinal silhouette with mild cardiomegaly. No pneumothorax. No pleural effusion. Mild pulmonary edema. No acute consolidative airspace disease. Surgical hardware from [facility]  overlies the lower cervical spine. IMPRESSION: Stable mild cardiomegaly without pulmonary edema. No active pulmonary disease. Electronically Signed   By: Ilona Sorrel M.D.   On: 02/19/2017 19:11    Procedures Procedures (including critical care time)  Medications Ordered in ED Medications  sodium chloride 0.9 % bolus 500 mL (0 mLs Intravenous Stopped 02/19/17 2020)     Initial Impression / Assessment and Plan / ED Course  I have reviewed the triage vital signs and the nursing notes.  Pertinent labs & imaging results that were available during my care of  the patient were reviewed by me and considered in my medical decision making (see chart for details).     81 year old male who presents with generalized weakness for one week since discharge from rehabilitation for recent stroke. He is nontoxic in no acute distress with normal vital signs. Mildly dry on exam, but otherwise nonfocal exam. He is no focal neurological deficits. Records are reviewed from 3 days ago, and he had a catheterized UA that showed potential UTI. However patient is a dialysis patient and rarely makes any urine, and I suspect contaminant. Urine culture also show staph epidermidis, which skin bacterial and likely contaminant. I felt unlikely to be due to UTI causing him symptoms. Blood work otherwise is stable compared to baseline. He had a CT head that was unremarkable.  Chest x-ray visualized from today. No evidence of pneumonia or other acute cardiopulmonary processes. His blood work overall his baseline compared to his normal. Suspect overall deconditioning from his hospitalization for stroke combined with dialysis. Did briefly speak with Dr. Leonel Ramsay from neurology given patient had severe vertebral artery stenosis on CTA during admission and question if that could play a role. He feels his symptoms are likely related to deconditioning. Patient is also discussed with social work and case management regarding placement  into nursing facility versus rehabilitation facility. The patient and family at this time cannot afford out of pocket pay for suggested facilities. CM to set up more home health services at home. Patient's insurance also not likely to cover admission just for PT, and so patient and wife elected for discharge home. Strict return and follow-up instructions reviewed. She expressed understanding of all discharge instructions and felt comfortable with the plan of care.   Final Clinical Impressions(s) / ED Diagnoses   Final diagnoses:  Weakness    New Prescriptions New Prescriptions   No medications on file     Forde Dandy, MD 02/20/17 [phone]

## 2017-02-19 NOTE — Progress Notes (Signed)
CSW spoke to pt's wife and encouraged her to ask for help from her fellow church members in getting assistance with helping her husband in and out of the vehicle when transporting the patient to the dialysis.  Pt's wife was appreciative and thanked the CSW.  James David. James David, Latanya Presser, LCAS Clinical Social Worker Ph: [phone]

## 2017-02-20 NOTE — ED Notes (Signed)
[facility] called for transport.  

## 2017-02-21 LAB — CULTURE, BLOOD (ROUTINE X 2)
CULTURE: NO GROWTH
CULTURE: NO GROWTH
Special Requests: ADEQUATE
Special Requests: ADEQUATE

## 2017-02-26 ENCOUNTER — Telehealth: Payer: Self-pay | Admitting: *Deleted

## 2017-02-26 NOTE — Telephone Encounter (Signed)
James David, PT, [facility] called to report that wife called stating that patient has had a fall each day for the last 3-4 days. PT says he will be out Thursday to see patient and check on injuries, none reported. FYI   Asked about wheelchair order.  Informed PT that  Wheelchair order was received however, it will not be signed until Dr. Letta Pate returns on Monday the 18th of June

## 2017-02-28 ENCOUNTER — Telehealth: Payer: Self-pay | Admitting: Neurology

## 2017-02-28 ENCOUNTER — Encounter: Payer: Self-pay | Admitting: Neurology

## 2017-02-28 ENCOUNTER — Ambulatory Visit (INDEPENDENT_AMBULATORY_CARE_PROVIDER_SITE_OTHER): Payer: Medicare Other | Admitting: Neurology

## 2017-02-28 VITALS — BP 100/50 | HR 74

## 2017-02-28 DIAGNOSIS — M542 Cervicalgia: Secondary | ICD-10-CM | POA: Insufficient documentation

## 2017-02-28 DIAGNOSIS — N184 Chronic kidney disease, stage 4 (severe): Secondary | ICD-10-CM | POA: Diagnosis not present

## 2017-02-28 DIAGNOSIS — I679 Cerebrovascular disease, unspecified: Secondary | ICD-10-CM

## 2017-02-28 DIAGNOSIS — I639 Cerebral infarction, unspecified: Secondary | ICD-10-CM | POA: Diagnosis not present

## 2017-02-28 DIAGNOSIS — M545 Low back pain, unspecified: Secondary | ICD-10-CM | POA: Insufficient documentation

## 2017-02-28 DIAGNOSIS — M6281 Muscle weakness (generalized): Secondary | ICD-10-CM

## 2017-02-28 DIAGNOSIS — I69398 Other sequelae of cerebral infarction: Secondary | ICD-10-CM

## 2017-02-28 DIAGNOSIS — R269 Unspecified abnormalities of gait and mobility: Secondary | ICD-10-CM

## 2017-02-28 DIAGNOSIS — G629 Polyneuropathy, unspecified: Secondary | ICD-10-CM | POA: Diagnosis not present

## 2017-02-28 NOTE — Progress Notes (Signed)
[facility]  PATIENT: James David DOB: [birth year]     HISTORICAL  CHIEF COMPLAINT:  Chief Complaint  Patient presents with  . Weakness, mult falls    HISTORY OF PRESENT ILLNESS:  James David is a 81 year old man who is followed by Dr. Erlinda Hong for a stroke after he presented with left arm numbness and tingling and slurred speech..   He has multiple medical issues including atrial fibrillation, second degree heart block with pacer, end-stage renal disease on hemodialysis, hypertension and hyperlipidemia.  He was hospitalized about a week and then did 9 days inpatient Rehab.  He got to a point he could walk > 50 feet with the walker and some with a cane    Currently, he feels he has generalized weakness and he is very sleepy.   He feels he has recovered some and left arm is better.  However, both legs feel weak (left = right).    He has fallen multiple times.   When he stands, the legs give out on him.   He can walk 50 feet with his walker but then he feels wiped out for the next 1/2 hour or so.       His wife feels he got weaker the day he came home from the hospital.   He feels weaker on hemodialysis days.     He feels his legs give out when he falls and he has recall of falling.    He does not lose consciousness but his wife notes that when he falls he seems dazed and is less responsive for a few seconds on the ground.  He denies presyncope or syncope since discharge but he did have syncope x 1 in the hospital and one episode of pre-syncope there.  He is also very sleepy but is not taking naps or dozing off.    He does not snore.   His wife has not noted OSA signs.     He does have SOB a lot more than he used to  Review of labs show mild hyponatremia (131-133), hypokalemia a fe times (as low as 2.9), mild hypocalcemia (but low albumin).   His GRF is only 6-12.    Hgb is 9.5-10.  LFTs are nornal   CTA 02/04/2017 was personally reviewed and I concur with Impression  below: IMPRESSION: 1. Severely depressed flow in the right V4 segment likely due to stenosis at the dura. Small basilar in the setting of fetal type left PCA, with superimposed moderate to advanced mid basilar stenosis. 2. Bilateral intracranial ICA stenosis that is moderate to advanced. 3. Advanced diffuse atherosclerotic irregularity of medium size intracranial vessels. 4. Severe right and moderate left vertebral origin stenosis. 5. No flow limiting stenosis in the atherosclerotic cervical carotid circulation. 6. 14 mm mass in the right tracheoesophageal groove with features of parathyroid adenoma. Correlate with this calcium labs. 7. Bilateral thyroid nodules measuring up to 25 mm on the right  From Dr. Phoebe Sharps hospital note 01/31/17: HISTORY OF PRESENT ILLNESS (per record) This is a 67-yo RH man who presented to the ED after he had an episode of L arm numbness and tingling yesterday 01/29/2017. He was at [facility] when he noted that his left arm felt numb. This was preceded by sweating and dizziness. He states the has also had some slurred speech. His numbness resolved after about 15 minutes but he feels like his speech remains slightly more slurred than usual. He denies any facial droop or symptoms in  the LLE. In the ED he had a CTH which showed no acute abnormality. His pacemaker was interrogated and showed no arryhythmia or other cardiac irregularity that correlated with his symptoms. He was admitted for further evaluation.   He states that last night he was trying to walk to the bathroom in his room but had difficulty because his left leg seemed to give out from under him. He does not feel any weakness in his leg right now.   Of note, the patient started hemodialysis on 5/15. He has a pacemaker in place due to a diagnosis of second degree heart block. He has a known h/o atrial fibrillation but is not anticoagulated. He denies any prior history of stroke.   Patient was not administered  IV t-PA secondary to delay in arrival. He was admitted for further evaluation and treatment.   SUBJECTIVE (INTERVAL HISTORY) Pt lying in bed and wife is at bedside. Pt and wife recounted HPI with me. He was in restaurant and had sudden onset left arm numbness and tingling, at the same time, he had slurry speech and felt dizzy and nauseous, not able to stand up. Called EMS, and on their arrival, BP 130s. The numbness and tingling lasted about 71min but slurry speech continued today. Wife felt his speech still not baseline but pt felt pretty good about himself. CT negative x 2. He has pacemaker due to AVB II but also found to have Aflutter. Not on AC. He is taking ASA 81mg  at home. Recently started HD for CKD, had some problem with fistular and thrombosis, following with Dr. Posey Pronto at [facility] and vascular center.    ROS:  Out of a complete 14 system review of symptoms, the patient complains only of the following symptoms, and all other reviewed systems are negative.  As above and he has fatigue. He has lower back pain. Mild neck pain.   ALLERGIES: Allergies  Allergen Reactions  . Amlodipine Besylate Swelling    angioedema  . Lisinopril Other (See Comments)    Renal insufficiency  . Statins Other (See Comments)    Muscle aches    HOME MEDICATIONS:  Current Outpatient Prescriptions:  .  acetaminophen (TYLENOL) 500 MG tablet, Take 500 mg by mouth daily as needed for mild pain. (pain/headaches), Disp: , Rfl:  .  aspirin EC 81 MG tablet, Take 81 mg by mouth at bedtime., Disp: , Rfl:  .  calcium acetate (PHOSLO) 667 MG capsule, Take 2 capsules (1,334 mg total) by mouth 3 (three) times daily., Disp: 180 capsule, Rfl: 0 .  carvedilol (COREG) 3.125 MG tablet, Take 1 tablet (3.125 mg total) by mouth 2 (two) times daily with a meal., Disp: 60 tablet, Rfl: 1 .  ezetimibe (ZETIA) 10 MG tablet, Take 1 tablet (10 mg total) by mouth daily., Disp: 30 tablet, Rfl: 1 .  fenofibrate 160 MG  tablet, Take 1 tablet (160 mg total) by mouth daily., Disp: 30 tablet, Rfl: 0 .  finasteride (PROSCAR) 5 MG tablet, Take 1 tablet (5 mg total) by mouth daily., Disp: 30 tablet, Rfl: 1 .  gabapentin (NEURONTIN) 100 MG capsule, Take 1 capsule (100 mg total) by mouth at bedtime., Disp: 30 capsule, Rfl: 1 .  glimepiride (AMARYL) 1 MG tablet, Take 1 tablet (1 mg total) by mouth daily with breakfast., Disp: 30 tablet, Rfl: 1 .  Hypromellose (ARTIFICIAL TEARS OP), Place 1 drop into both eyes daily as needed (supplement)., Disp: , Rfl:  .  ipratropium (ATROVENT) 0.03 % nasal  spray, Place 2 sprays into both nostrils daily. , Disp: , Rfl:  .  rOPINIRole (REQUIP) 0.5 MG tablet, Take 1 tablet (0.5 mg total) by mouth at bedtime., Disp: 30 tablet, Rfl: 1 .  senna-docusate (SENOKOT-S) 8.6-50 MG tablet, Take 2 tablets by mouth at bedtime., Disp: , Rfl:  .  warfarin (COUMADIN) 1 MG tablet, 1 1/2 tab daily (Patient taking differently: Take 1.5 mg by mouth daily at 6 PM. ), Disp: 30 tablet, Rfl: 1 .  multivitamin (RENA-VIT) TABS tablet, Take 1 tablet by mouth at bedtime. (Patient not taking: Reported on 02/28/2017), Disp: 30 tablet, Rfl: 0  PAST MEDICAL HISTORY: Past Medical History:  Diagnosis Date  . Atrial fibrillation ([place])   . Chronic kidney disease, stage IV (severe) (HCC)    baseline creatinine 2-3  . DDD (degenerative disc disease)   . Diabetes mellitus without complication ([place])   . ESRD (end stage renal disease) on dialysis ([place])   . Hyperlipidemia   . Hypertension   . Melanoma ([place]) 2010   removed at [facility]  . Second degree heart block    a. s/p STJ dual chamber PPM - Dr [name]  . Stroke [facility]) 01/2017    PAST SURGICAL HISTORY: Past Surgical History:  Procedure Laterality Date  . AV FISTULA PLACEMENT Right 05/31/2014   Procedure: Right Arm Brachiocephalic ARTERIOVENOUS FISTULA CREATION  ;  Surgeon: Conrad Puyallup, MD;  Location: [place];  Service: Vascular;  Laterality: Right;  . CATARACT EXTRACTION  W/ INTRAOCULAR LENS  IMPLANT, BILATERAL    . COLONOSCOPY    . EXCISION MELANOMA WITH SENTINEL LYMPH NODE BIOPSY Right 02/08/2016   Procedure: WIDE EXCISION RIGHT SHOULDER MELANOMA WITH RIGHT SENTINEL LYMPH NODE BIOPSY;  Surgeon: [name], MD;  Location: [place];  Service: General;  Laterality: Right;  . LAMINECTOMY    . PERMANENT PACEMAKER INSERTION N/A 01/20/2014   STJ Assurity dual chamber pacemaker implanted by Dr [name] for 2nd degree AV block    FAMILY HISTORY: Family History  Problem Relation Age of Onset  . Pulmonary embolism Mother   . Diabetes Mother   . Heart attack Father   . Diabetes Brother     SOCIAL HISTORY:  Social History   Social History  . Marital status: Married    Spouse name: [name]  . Number of children: N/A  . Years of education: N/A   Occupational History  .      retired   Social History Main Topics  . Smoking status: Never Smoker  . Smokeless tobacco: Never Used  . Alcohol use No  . Drug use: No  . Sexual activity: No   Other Topics Concern  . Not on file   Social History Narrative   Lives in [place] with spouse.  Retired Theatre manager for [employer].     PHYSICAL EXAM  Vitals:   02/28/17 0835  BP: (!) 100/50  Pulse: 74    There is no height or weight on file to calculate BMI.   General: The patient is well-developed and well-nourished and in no acute distress.  He is in a wheelchair.  Eyes:  Funduscopic exam shows normal optic discs.  Neck: No carotid bruits are noted.  The neck is nontender.   Mild reduced range of motion.  Cardiovascular:  The heart had a regular rate and rhythm. He had a mild murmur. No rubs. Peripheral pulses were present.   Lungs were clear to auscultation.  Skin: Extremities are without rash.  Minimal pedal edema  Musculoskeletal:  Back is mildly tender over the lower lumbar paraspinal muscles  Neurologic Exam  Mental status: The patient is alert and oriented x 3 at the time of the  examination. The patient has apparent normal recent and remote memory, with an apparently normal attention span and concentration ability.   Speech is normal.  Cranial nerves: Extraocular movements are full. Pupils are equal, round, and reactive to light and accomodation. Facial strength and sensation was normal. Trapezius and sternocleidomastoid strength is normal. No dysarthria is noted.  The tongue is midline, and the patient has symmetric elevation of the soft palate. No obvious hearing deficits are noted.  Motor:  Muscle bulk is normal.   Tone is normal. Strength is  5 / 5 in all 4 extremities except 4+/5 in iliopsoas muscles of hip flexures and 4/5 toe extensors.   Sensory: He has reduced vibration sensation in the toes and ankles and normal sensation in the knees   Coordination: Cerebellar testing reveals good finger-nose-finger and poor heel-to-shin bilaterally.  Gait and station: He needs to use both arms to stand up from the wheelchair. Without the walker, he cannot take any steps. With the walker he can walk some around the room with a small stride. His legs begin to buckle after about 4 or 5 steps  Reflexes: Deep tendon reflexes are symmetric and normal bilaterally.     DIAGNOSTIC DATA (LABS, IMAGING, TESTING) - I reviewed patient records, labs, notes, testing and imaging myself where available.  Lab Results  Component Value Date   WBC 5.5 02/19/2017   HGB 9.5 (L) 02/19/2017   HCT 29.5 (L) 02/19/2017   MCV 93.7 02/19/2017   PLT 193 02/19/2017      Component Value Date/Time   NA 133 (L) 02/19/2017 1810   NA 138 10/17/2016 1303   K 3.1 (L) 02/19/2017 1810   K 5.1 10/17/2016 1303   CL 95 (L) 02/19/2017 1810   CO2 30 02/19/2017 1810   CO2 18 (L) 10/17/2016 1303   GLUCOSE 132 (H) 02/19/2017 1810   GLUCOSE 197 (H) 10/17/2016 1303   BUN 18 02/19/2017 1810   BUN 71.0 (H) 10/17/2016 1303   CREATININE 4.29 (H) 02/19/2017 1810   CREATININE 5.9 (HH) 10/17/2016 1303   CALCIUM  8.1 (L) 02/19/2017 1810   CALCIUM 8.9 10/17/2016 1303   PROT 5.1 (L) 02/19/2017 1810   PROT 6.2 (L) 10/17/2016 1303   ALBUMIN 2.9 (L) 02/19/2017 1810   ALBUMIN 2.9 (L) 10/17/2016 1303   AST 21 02/19/2017 1810   AST 8 10/17/2016 1303   ALT 22 02/19/2017 1810   ALT 13 10/17/2016 1303   ALKPHOS 40 02/19/2017 1810   ALKPHOS 95 10/17/2016 1303   BILITOT 0.7 02/19/2017 1810   BILITOT 0.24 10/17/2016 1303   GFRNONAA 12 (L) 02/19/2017 1810   GFRAA 14 (L) 02/19/2017 1810   Lab Results  Component Value Date   CHOL 176 01/30/2017   HDL 29 (L) 01/30/2017   LDLCALC 99 01/30/2017   TRIG 242 (H) 01/30/2017   CHOLHDL 6.1 01/30/2017   Lab Results  Component Value Date   HGBA1C 7.0 (H) 01/30/2017   No results found for: VITAMINB12 Lab Results  Component Value Date   TSH 0.699 02/05/2017       ASSESSMENT AND PLAN  Subcortical infarction ([place]) - Plan: CK  Intracranial vascular stenosis  Chronic kidney disease, stage IV (severe) (HCC) - Plan: Comprehensive metabolic panel, Magnesium, Acetylcholine receptor, binding, CK, VITAMIN D 25 Hydroxy (Vit-D Deficiency,  Fractures), Vitamin B12  Gait disturbance, post-stroke - Plan: CT CERVICAL SPINE WO CONTRAST, CT LUMBAR SPINE WO CONTRAST  Low back pain, unspecified back pain laterality, unspecified chronicity, with sciatica presence unspecified - Plan: CT CERVICAL SPINE WO CONTRAST  Neck pain - Plan: CT LUMBAR SPINE WO CONTRAST  Polyneuropathy - Plan: Vitamin B12  Muscle weakness - Plan: CK, TSH    In summary, James David is a 81 year old man who likely had a subcortical stroke last month. Despite treatment with recovery of the slurred speech and resolution of left-sided numbness, he is having much more difficulty walking. At times his legs completely give out most likely, he has significant deconditioning. I discussed with the wife that if another etiology is not determined that we may want to consider having him get his physical therapy  at the outpatient rehabilitation center as they may be ordered to work with him more effectively than in-home physical therapy. However we need to rule out a pathologic process. He has neck and back pain and we need to do a CT scan of the spine to make sure that there is not significant spinal stenosis in the neck or lower back. Additionally, he has had hypokalemia and other metabolic derangement. I will check a complete metabolic panel and also check magnesium. If significant abnormality is found, we will make sure that his renal doctor is aware to recommend supplementation. Although myasthenia gravis is not too likely (normal eye movements), he is having his legs give out and difficulty rising from the chair.  Therefore, I will also check an acetylcholine receptor antibody. I see no record of recent vitamin B-12 or D testing and we will check that. Most likely his neuropathy is due to his diabetes.  He will follow with Dr. Erlinda Hong as scheduled and call our office if there are significant neurologic changes.    Previn Jian A. [name], MD, PhD, Charlynn Grimes 6/38/4536, 4:68 AM Certified in Neurology, Clinical Neurophysiology, Sleep Medicine, Pain Medicine and Neuroimaging  [facility] [address] [place],  [address] [phone]

## 2017-02-28 NOTE — Telephone Encounter (Addendum)
Dr. [name] would like for Patient to have CT's  sometime  next week    Patient can only have done on  Tues , Thursday and  Saturday. Because of Dialysis . Patient and his wife are aware Raquel Sarna is off today . Raquel Sarna will call them after she gets authorization with details.

## 2017-02-28 NOTE — Telephone Encounter (Signed)
Pt wife calling  Wants to know if CT scans can be done at the [facility] on [facility] in [facility]

## 2017-03-03 ENCOUNTER — Telehealth: Payer: Self-pay | Admitting: Neurology

## 2017-03-03 ENCOUNTER — Ambulatory Visit (HOSPITAL_BASED_OUTPATIENT_CLINIC_OR_DEPARTMENT_OTHER)
Admission: RE | Admit: 2017-03-03 | Discharge: 2017-03-03 | Disposition: A | Discharge status: Home / Self Care | Payer: Medicare Other | Source: Ambulatory Visit | Attending: Neurology | Admitting: Neurology

## 2017-03-03 DIAGNOSIS — Z7901 Long term (current) use of anticoagulants: Secondary | ICD-10-CM | POA: Diagnosis not present

## 2017-03-03 DIAGNOSIS — E041 Nontoxic single thyroid nodule: Secondary | ICD-10-CM | POA: Insufficient documentation

## 2017-03-03 DIAGNOSIS — R269 Unspecified abnormalities of gait and mobility: Secondary | ICD-10-CM | POA: Diagnosis not present

## 2017-03-03 DIAGNOSIS — N184 Chronic kidney disease, stage 4 (severe): Secondary | ICD-10-CM | POA: Diagnosis not present

## 2017-03-03 DIAGNOSIS — M47892 Other spondylosis, cervical region: Secondary | ICD-10-CM | POA: Diagnosis not present

## 2017-03-03 DIAGNOSIS — M542 Cervicalgia: Secondary | ICD-10-CM

## 2017-03-03 DIAGNOSIS — R339 Retention of urine, unspecified: Secondary | ICD-10-CM | POA: Diagnosis not present

## 2017-03-03 DIAGNOSIS — I69398 Other sequelae of cerebral infarction: Secondary | ICD-10-CM

## 2017-03-03 DIAGNOSIS — D631 Anemia in chronic kidney disease: Secondary | ICD-10-CM | POA: Diagnosis not present

## 2017-03-03 DIAGNOSIS — I4891 Unspecified atrial fibrillation: Secondary | ICD-10-CM | POA: Diagnosis not present

## 2017-03-03 DIAGNOSIS — M47896 Other spondylosis, lumbar region: Secondary | ICD-10-CM | POA: Diagnosis not present

## 2017-03-03 DIAGNOSIS — M2578 Osteophyte, vertebrae: Secondary | ICD-10-CM | POA: Diagnosis not present

## 2017-03-03 DIAGNOSIS — Z950 Presence of cardiac pacemaker: Secondary | ICD-10-CM | POA: Diagnosis not present

## 2017-03-03 DIAGNOSIS — I69364 Other paralytic syndrome following cerebral infarction affecting left non-dominant side: Secondary | ICD-10-CM | POA: Diagnosis not present

## 2017-03-03 DIAGNOSIS — E785 Hyperlipidemia, unspecified: Secondary | ICD-10-CM | POA: Diagnosis not present

## 2017-03-03 DIAGNOSIS — Z992 Dependence on renal dialysis: Secondary | ICD-10-CM | POA: Diagnosis not present

## 2017-03-03 DIAGNOSIS — I129 Hypertensive chronic kidney disease with stage 1 through stage 4 chronic kidney disease, or unspecified chronic kidney disease: Secondary | ICD-10-CM | POA: Diagnosis not present

## 2017-03-03 DIAGNOSIS — M545 Low back pain: Secondary | ICD-10-CM | POA: Diagnosis not present

## 2017-03-03 DIAGNOSIS — E1122 Type 2 diabetes mellitus with diabetic chronic kidney disease: Secondary | ICD-10-CM | POA: Diagnosis not present

## 2017-03-03 DIAGNOSIS — I6523 Occlusion and stenosis of bilateral carotid arteries: Secondary | ICD-10-CM | POA: Diagnosis not present

## 2017-03-03 DIAGNOSIS — Z7982 Long term (current) use of aspirin: Secondary | ICD-10-CM | POA: Diagnosis not present

## 2017-03-03 LAB — COMPREHENSIVE METABOLIC PANEL
ALT: 18 IU/L (ref 0–44)
AST: 16 IU/L (ref 0–40)
Albumin/Globulin Ratio: 1.8 (ref 1.2–2.2)
Albumin: 3.2 g/dL — ABNORMAL LOW (ref 3.5–4.8)
Alkaline Phosphatase: 51 IU/L (ref 39–117)
BILIRUBIN TOTAL: 0.2 mg/dL (ref 0.0–1.2)
BUN/Creatinine Ratio: 6 — ABNORMAL LOW (ref 10–24)
BUN: 46 mg/dL — AB (ref 8–27)
CALCIUM: 10.2 mg/dL (ref 8.6–10.2)
CHLORIDE: 96 mmol/L (ref 96–106)
CO2: 25 mmol/L (ref 20–29)
Creatinine, Ser: 7.14 mg/dL — ABNORMAL HIGH (ref 0.76–1.27)
GFR calc non Af Amer: 7 mL/min/{1.73_m2} — ABNORMAL LOW (ref >59)
GFR, EST AFRICAN AMERICAN: 8 mL/min/{1.73_m2} — AB (ref >59)
GLUCOSE: 156 mg/dL — AB (ref 65–99)
Globulin, Total: 1.8 g/dL (ref 1.5–4.5)
Potassium: 4.7 mmol/L (ref 3.5–5.2)
Sodium: 140 mmol/L (ref 134–144)
TOTAL PROTEIN: 5 g/dL — AB (ref 6.0–8.5)

## 2017-03-03 LAB — VITAMIN D 25 HYDROXY (VIT D DEFICIENCY, FRACTURES): VIT D 25 HYDROXY: 20.5 ng/mL — AB (ref 30.0–100.0)

## 2017-03-03 LAB — CK: CK TOTAL: 37 U/L (ref 24–204)

## 2017-03-03 LAB — ACETYLCHOLINE RECEPTOR, BINDING: AChR Binding Ab, Serum: 1.79 nmol/L — ABNORMAL HIGH (ref 0.00–0.24)

## 2017-03-03 LAB — TSH: TSH: 0.748 u[IU]/mL (ref 0.450–4.500)

## 2017-03-03 LAB — VITAMIN B12: Vitamin B-12: 568 pg/mL (ref 232–1245)

## 2017-03-03 LAB — MAGNESIUM: Magnesium: 2.3 mg/dL (ref 1.6–2.3)

## 2017-03-03 MED ORDER — PYRIDOSTIGMINE BROMIDE 60 MG PO TABS: ORAL_TABLET | ORAL | 5 refills | Status: DC

## 2017-03-03 NOTE — Telephone Encounter (Signed)
Patient is scheduled to have CT this evening 03/03/17.

## 2017-03-03 NOTE — Telephone Encounter (Signed)
I spoke with [name] wife about labs.   The acetylcholine receptor antibodies were elevated consistent with myasthenia gravis. I will have him start Mestinon 30 mg by mouth 3 times a day and increase to 60 mg by mouth 3 times a day if tolerated.  The vitamin D was low. As he is a chronic renal insufficiency patient, I have asked the wife to let his doctor know at his next visit so that the Vit D can be supplemented.

## 2017-03-03 NOTE — Telephone Encounter (Signed)
I spoke to James David at the [place] [address]. I informed her that he would like to have his CT's done on Tuesday, Thursday or Saturday.. She she will call the patient and schedule his CT. His insurance [facility] Medicare does not require authorization.

## 2017-03-03 NOTE — Telephone Encounter (Signed)
Noted, thank you

## 2017-03-04 ENCOUNTER — Ambulatory Visit (INDEPENDENT_AMBULATORY_CARE_PROVIDER_SITE_OTHER): Payer: Medicare Other | Admitting: Cardiology

## 2017-03-04 ENCOUNTER — Encounter: Payer: Self-pay | Admitting: Cardiology

## 2017-03-04 VITALS — BP 100/59 | HR 78 | Ht 70.0 in

## 2017-03-04 DIAGNOSIS — I639 Cerebral infarction, unspecified: Secondary | ICD-10-CM | POA: Diagnosis not present

## 2017-03-04 DIAGNOSIS — Z950 Presence of cardiac pacemaker: Secondary | ICD-10-CM | POA: Diagnosis not present

## 2017-03-04 DIAGNOSIS — R4189 Other symptoms and signs involving cognitive functions and awareness: Secondary | ICD-10-CM

## 2017-03-04 DIAGNOSIS — N186 End stage renal disease: Secondary | ICD-10-CM

## 2017-03-04 DIAGNOSIS — W19XXXD Unspecified fall, subsequent encounter: Secondary | ICD-10-CM

## 2017-03-04 DIAGNOSIS — G7000 Myasthenia gravis without (acute) exacerbation: Secondary | ICD-10-CM

## 2017-03-04 DIAGNOSIS — I484 Atypical atrial flutter: Secondary | ICD-10-CM | POA: Diagnosis not present

## 2017-03-04 DIAGNOSIS — I5032 Chronic diastolic (congestive) heart failure: Secondary | ICD-10-CM

## 2017-03-04 DIAGNOSIS — E119 Type 2 diabetes mellitus without complications: Secondary | ICD-10-CM

## 2017-03-04 DIAGNOSIS — I10 Essential (primary) hypertension: Secondary | ICD-10-CM | POA: Diagnosis not present

## 2017-03-04 NOTE — Progress Notes (Signed)
Cardiology Office Note   Date:  03/04/2017   ID:  James David, DOB 1936/03/08, MRN [id number]  PCP:  [name], MD  Cardiologist:  Dr. Tamala Julian    Chief Complaint  Patient presents with  . Hospitalization Follow-up      History of Present Illness: James David is a 81 y.o. male who presents for CAD, atrial fib, a flutter, CKD, PPm HTN and HLD.  Last seen by Dr. Tamala Julian 05/23/16  He was followed by Dr. Justin Mend for renal failure now with dialysis.    Pt was hospitalized in May after sudden diaphoresis dizziness with slurred speech and pre-syncope, head CT without acute abnormalities TIA. He was orthostatic and BB stopped.  He is on dialysis now with MWF schedule.  Pt on coumadin.  He did have syncope in the hospital.  Possible small subcortical versus brainstem stroke concerning for embolic secondary due to a atrial flutter noton anticoagulation. Pt was recommended ASA and coumadin.  Pt then went to rehab.  He was placed back on low dose coreg prior to d/c.   Today he is back at home without chest pain some SOB at times.  On last EKG when seen in ER he ws in SR with V pacing.     He and his wife are concerned that he is having freq falls.   He was not having these when he left the hospital. He is not dizzy and no syncope.  With these episodes his wife feels like he is disoriented after fall.  Pt states he knows what is happening.  His legs give ut and he falls.  He has seen neurology and they have done CT of neck and lumbar regions of back.   Pt also with having BMs and not knowing when he needs to go.  They will discuss with neuro when he calls results of CT of back.  We will have pacer downloaded to see if rapid HR that could correlate with falls, if none then may need to wear 48 hour monitor to eval.    On CTA of neck pt with bilateral intracranial ICA stenosis mod. To advanced.  Severe right and moderate left vertebral origin stenosis.  No flow limiting stenosis in the  atherosclerotic cervical carotid circulation.  Advanced diffuse atherosclerotic irregularity of medium size intracranial vessels.   Severely depressed flow in the right V4 segment likely due to stenosis at the dura. Small basilar in the setting of fetal type left PCA, with superimposed moderate to advanced mid basilar stenosis.  Dr. [name] did labs and he was diagnosed with myasthenia gravis.  This would explain weakness.  He was started on Mestinon.   Last CT of head 02/16/17 with chronic changes without acute abnormality, no significant interval change.   Pt now on coumadin and home health comes out to check, yesterday INR 2.4.    He has no chest pain and SOB episodically but now on dialysis.Marland Kitchen   His BP is low today but his wife tells me after falls she calls EMS to help him up and his BP is around 130 with this.  One thought is to hold AM dose of Coreg AM of dialysis - will plan for that but if freq a fib on PPM with interrogation will need to increase to BID.     Past Medical History:  Diagnosis Date  . Atrial fibrillation ([place])   . Chronic kidney disease, stage IV (severe) ([facility])    baseline creatinine  2-3  . DDD (degenerative disc disease)   . Diabetes mellitus without complication ([place])   . ESRD (end stage renal disease) on dialysis ([place])   . Hyperlipidemia   . Hypertension   . Melanoma ([place]) 2010   removed at [facility]  . Second degree heart block    a. s/p STJ dual chamber PPM - Dr [name]  . Stroke [facility]) 01/2017    Past Surgical History:  Procedure Laterality Date  . AV FISTULA PLACEMENT Right 05/31/2014   Procedure: Right Arm Brachiocephalic ARTERIOVENOUS FISTULA CREATION  ;  Surgeon: Conrad Dietrich, MD;  Location: [facility];  Service: Vascular;  Laterality: Right;  . CATARACT EXTRACTION W/ INTRAOCULAR LENS  IMPLANT, BILATERAL    . COLONOSCOPY    . EXCISION MELANOMA WITH SENTINEL LYMPH NODE BIOPSY Right 02/08/2016   Procedure: WIDE EXCISION RIGHT SHOULDER MELANOMA WITH RIGHT SENTINEL LYMPH  NODE BIOPSY;  Surgeon: [name], MD;  Location: [facility];  Service: General;  Laterality: Right;  . LAMINECTOMY    . PERMANENT PACEMAKER INSERTION N/A 01/20/2014   STJ Assurity dual chamber pacemaker implanted by Dr [name] for 2nd degree AV block     Current Outpatient Prescriptions  Medication Sig Dispense Refill  . acetaminophen (TYLENOL) 500 MG tablet Take 500 mg by mouth daily as needed for mild pain. (pain/headaches)    . aspirin EC 81 MG tablet Take 81 mg by mouth at bedtime.    . calcium acetate (PHOSLO) 667 MG capsule Take 2 capsules (1,334 mg total) by mouth 3 (three) times daily. 180 capsule 0  . carvedilol (COREG) 3.125 MG tablet Take 1 tablet (3.125 mg total) by mouth 2 (two) times daily with a meal. 60 tablet 1  . ezetimibe (ZETIA) 10 MG tablet Take 1 tablet (10 mg total) by mouth daily. 30 tablet 1  . fenofibrate 160 MG tablet Take 1 tablet (160 mg total) by mouth daily. 30 tablet 0  . finasteride (PROSCAR) 5 MG tablet Take 1 tablet (5 mg total) by mouth daily. 30 tablet 1  . gabapentin (NEURONTIN) 100 MG capsule Take 1 capsule (100 mg total) by mouth at bedtime. 30 capsule 1  . glimepiride (AMARYL) 1 MG tablet Take 1 tablet (1 mg total) by mouth daily with breakfast. 30 tablet 1  . Hypromellose (ARTIFICIAL TEARS OP) Place 1 drop into both eyes daily as needed (supplement).    Marland Kitchen ipratropium (ATROVENT) 0.03 % nasal spray Place 2 sprays into both nostrils daily.     . multivitamin (RENA-VIT) TABS tablet Take 1 tablet by mouth at bedtime. 30 tablet 0  . pyridostigmine (MESTINON) 60 MG tablet Take 1/2 to 1 pill po tid 90 tablet 5  . rOPINIRole (REQUIP) 0.5 MG tablet Take 1 tablet (0.5 mg total) by mouth at bedtime. 30 tablet 1  . warfarin (COUMADIN) 1 MG tablet 1 1/2 tab daily (Patient taking differently: Take 1.5 mg by mouth daily at 6 PM. ) 30 tablet 1  . senna-docusate (SENOKOT-S) 8.6-50 MG tablet Take 2 tablets by mouth at bedtime. (Patient not taking: Reported on 03/04/2017)       No current facility-administered medications for this visit.     Allergies:   Amlodipine besylate; Lisinopril; and Statins    Social History:  The patient  reports that he has never smoked. He has never used smokeless tobacco. He reports that he does not drink alcohol or use drugs.   Family History:  The patient's family history includes Diabetes in his brother and  mother; Heart attack in his father; Pulmonary embolism in his mother.    ROS:  General:no colds or fevers, no weight changes Skin:no rashes or ulcers HEENT:no blurred vision, no congestion CV:see HPI PUL:see HPI GI:no diarrhea constipation or melena, no indigestion, does not know when bowels move. GU:no hematuria, no dysuria MS:no joint pain, no claudication, freq falls, neck weakness Neuro:no syncope, no lightheadedness Endo:+ diabetes, no thyroid disease  Wt Readings from Last 3 Encounters:  02/19/17 168 lb (76.2 kg)  02/16/17 168 lb (76.2 kg)  02/15/17 168 lb 6.9 oz (76.4 kg)     PHYSICAL EXAM: VS:  BP (!) 100/59   Pulse 78   Ht 5\' 10"  (1.778 m)   SpO2 98%  , BMI There is no height or weight on file to calculate BMI. General:Pleasant affect, NAD Skin:Warm and dry, brisk capillary refill HEENT:normocephalic, sclera clear, mucus membranes moist Neck:supple, no JVD, no bruits  Heart:S1S2 RRR without murmur, gallup, rub or click Lungs:clear without rales, rhonchi, or wheezes VCB:SWHQ, non tender, + BS, do not palpate liver spleen or masses Ext:no lower ext edema, 2+ pedal pulses, 2+ radial pulses Neuro:alert and oriented X 3, MAE, follows commands, + facial symmetry    EKG:  EKG is NOT ordered today.   Recent Labs: 02/19/2017: Hemoglobin 9.5; Platelets 193 02/28/2017: ALT 18; BUN 46; Creatinine, Ser 7.14; Magnesium 2.3; Potassium 4.7; Sodium 140; TSH 0.748    Lipid Panel    Component Value Date/Time   CHOL 176 01/30/2017 0424   TRIG 242 (H) 01/30/2017 0424   HDL 29 (L) 01/30/2017 0424   CHOLHDL  6.1 01/30/2017 0424   VLDL 48 (H) 01/30/2017 0424   LDLCALC 99 01/30/2017 0424       Other studies Reviewed: Additional studies/ records that were reviewed today include: .  Echo 01/31/17 Study Conclusions  - Left ventricle: The cavity size was normal. Wall thickness was   increased in a pattern of mild LVH. Systolic function was   vigorous. The estimated ejection fraction was in the range of 65%   to 70%. There is hypokinesis of the apical myocardium. Doppler   parameters are consistent with abnormal left ventricular   relaxation (grade 1 diastolic dysfunction). - Aortic valve: There was trivial regurgitation. - Mitral valve: Calcified annulus.  Impressions:  - Mild apical hypokinesis likely related to pacing; overall   vigorous LV systolic function; mild diastolic dysfunction; mild   LVH; trace AI.  ASSESSMENT AND PLAN:  1.  PAF with recent CVA and went rehab post hospitalization.  No awareness of rapid HR.  Last EKG SR with V pacing.  Now on coumadin and ASA per neuro.  CHA2DS2VASc of 4.  No bleeding + bruises with recent falls.  Will have PPM interrogated to see if PAF causing falls if rapid.  2. Chronic diastolic HF, now on dialysis.  occ SOB.    3. Borderline BP today with syncope in the hospital with orthostatic hypotension.  Plan will be to hold AM coreg on days pt has dialysis.   4. Recent CVA as above and per neuro  5. freq falls since discharge from rehab.  Followed by Neuro   6. New diagnosis of myasthenia Gravis followed by Neuro now on Mestinon   7. DM -2 stable followed by PCP  8. anticoagulation  On coumadin and asa per neuro HHRN drawing labs.  Dr. Harrington Challenger following  9.  Hx of second degree heart block with PPM.    Current medicines are reviewed  with the patient today.  The patient Has no concerns regarding medicines.  The following changes have been made:  See above Labs/ tests ordered today include:see above  Disposition:   FU:  see  above  Signed, Cecilie Kicks, NP  03/04/2017 2:02 PM    [facility] [place], [address], [place] Phone: [phone]; Fax: [phone]

## 2017-03-04 NOTE — Patient Instructions (Addendum)
Medication Instructions:  Your physician recommends that you continue on your current medications as directed. Please refer to the Current Medication list given to you today. Take Coreg only during the evening on dialysis days. Other days, take in the morning and evening.    Labwork: None Ordered   Testing/Procedures: None Ordered   Follow-Up: Your physician wants you to follow-up in: 2 months with Dr. Tamala Julian. You will receive a reminder letter in the mail two months in advance. If you don't receive a letter, please call our office to schedule the follow-up appointment.    Any Other Special Instructions Will Be Listed Below (If Applicable). Send remote transmission today or tomorrow morning. If you have any problems, please call our office. [phone]    If you need a refill on your cardiac medications before your next appointment, please call your pharmacy.

## 2017-03-05 ENCOUNTER — Telehealth: Payer: Self-pay | Admitting: *Deleted

## 2017-03-05 NOTE — Telephone Encounter (Signed)
I have spoken with wife [name]), and per RAS, reviewed CT results as below.  She doesn't recall pt. ever being told he had a thyroid nodule.  I have advised they f/u with pcp (Dr. Melinda Crutch at [place] [facility]).  I have faxed CT results to Dr. Harrington Challenger at fax# [phone].  She verbalized understanding of same.  She sts. pt. started Mestinon 2 days ago and so far is tolerating it well/fim

## 2017-03-05 NOTE — Telephone Encounter (Signed)
-----   Message from Britt Bottom, MD sent at 03/05/2017  9:53 AM EDT ----- Please let him (or wife)  know that the CT scan of the cervical and lumbar spine show a lot of of arthritic degenerative changes but nothing bad enough to affect his strength or walking.  James David does have a nodule in the thyroid on the right. If this has never been worked up in the past, James David needs to have a thyroid ultrasound.  I spoke to his wife earlier this week because the myasthenia gravis lab work was positive and we started Mestinon. Please ask how James David is doing with the medication and let me know if James David is having any GI upset.

## 2017-03-05 NOTE — Telephone Encounter (Signed)
Left message at home # for pt. to call/fim

## 2017-03-05 NOTE — Telephone Encounter (Signed)
-----   Message from Britt Bottom, MD sent at 03/05/2017  9:53 AM EDT ----- Please let him (or wife)  know that the CT scan of the cervical and lumbar spine show a lot of of arthritic degenerative changes but nothing bad enough to affect his strength or walking.  He does have a nodule in the thyroid on the right. If this has never been worked up in the past, he needs to have a thyroid ultrasound.  I spoke to his wife earlier this week because the myasthenia gravis lab work was positive and we started Mestinon. Please ask how he is doing with the medication and let me know if he is having any GI upset.

## 2017-03-06 ENCOUNTER — Telehealth: Payer: Self-pay | Admitting: Neurology

## 2017-03-06 ENCOUNTER — Telehealth: Payer: Self-pay | Admitting: *Deleted

## 2017-03-06 NOTE — Telephone Encounter (Signed)
I have spoken with James David this morning.  She sts. Coal had onset of diarrhea 2330 last night.  He is not able to tell when he is going to have a stool.  O/W feels ok.  Will check with RAS and call her back/fim

## 2017-03-06 NOTE — Telephone Encounter (Signed)
Pt wife called to inform that since last Thurs  Pt has been on the pyridostigmine (MESTINON) 60 MG tablet and it has caused diarreah VERY bad for pt.  Pt wife said that she fears he will become dehydrated, pt is on dialysis.  Pt wife said that at this point pt is unable to tell when he needs to have a BM, because he feels numb in that area, she is asking to be called.

## 2017-03-06 NOTE — Telephone Encounter (Signed)
-----   Message from Isaiah Serge, NP sent at 03/06/2017 11:30 AM EDT ----- Regarding: FW: Remote results  Please let pt and his wife know that no arrhthymias causing the falls. Believe it is due to the weak muscles.    ----- Message ----- From: Shiela Mayer, RN Sent: 03/05/2017   4:20 PM To: Isaiah Serge, NP Subject: Remote results                                 Hey!  Remote transmission received. Presenting rhythm: AsVp @ 77bpm. <1% AT/AF burden, max dur. 51mins (02/06/17). No ventricular high rates. >99% Vp. Rates well controlled.   Hope this helps :-)

## 2017-03-06 NOTE — Telephone Encounter (Signed)
I have spoken with [name] again today, and per RAS, advised pt. take otc Imodium. I offered to send rx. to pharmacy, but [employer]. she already has Imodium at home.  Pt. should drink plenty of fluids, some Gatorade as well for electrolyte replenishment, and they should call our office back if condition persists/worsens.  [name] verbalized understanding of same/fim

## 2017-03-06 NOTE — Telephone Encounter (Signed)
Per Cecilie Kicks, NP, spoke with Mrs. Rattigan and let her know that ps remote transmission didn't show any arrhthymias. She verbalized understanding.

## 2017-03-10 NOTE — Telephone Encounter (Signed)
Patients wife called office in reference to patient continuing to have diarrhea even after using Imodium.  Wife states patient has no control over his bowel or kidneys at this point.  Please call.

## 2017-03-10 NOTE — Telephone Encounter (Signed)
I have spoken with James David this morning and per RAS, advised pt. decrease Mestinon to 1/2 tab bid.  She verbalized understanding of same/fim

## 2017-03-13 ENCOUNTER — Telehealth: Payer: Self-pay

## 2017-03-13 NOTE — Telephone Encounter (Signed)
Tharon Aquas PT [facility] called [phone] requesting verbal orders for 1xwk X 3wks, called her back and approved verbal orders

## 2017-03-14 ENCOUNTER — Other Ambulatory Visit: Payer: Self-pay | Admitting: Physical Medicine & Rehabilitation

## 2017-03-17 ENCOUNTER — Inpatient Hospital Stay (HOSPITAL_COMMUNITY)
Admission: EM | Admit: 2017-03-17 | Discharge: 2017-03-26 | DRG: 871 | Disposition: A | Discharge status: Skilled Nursing Facility | Payer: Medicare Other | Attending: Internal Medicine | Admitting: Internal Medicine

## 2017-03-17 ENCOUNTER — Emergency Department (HOSPITAL_COMMUNITY): Payer: Medicare Other

## 2017-03-17 ENCOUNTER — Encounter (HOSPITAL_COMMUNITY): Payer: Self-pay

## 2017-03-17 DIAGNOSIS — G253 Myoclonus: Secondary | ICD-10-CM | POA: Diagnosis present

## 2017-03-17 DIAGNOSIS — I5032 Chronic diastolic (congestive) heart failure: Secondary | ICD-10-CM | POA: Diagnosis present

## 2017-03-17 DIAGNOSIS — L8990 Pressure ulcer of unspecified site, unspecified stage: Secondary | ICD-10-CM | POA: Diagnosis present

## 2017-03-17 DIAGNOSIS — Z79899 Other long term (current) drug therapy: Secondary | ICD-10-CM

## 2017-03-17 DIAGNOSIS — R195 Other fecal abnormalities: Secondary | ICD-10-CM

## 2017-03-17 DIAGNOSIS — N2581 Secondary hyperparathyroidism of renal origin: Secondary | ICD-10-CM | POA: Diagnosis present

## 2017-03-17 DIAGNOSIS — R791 Abnormal coagulation profile: Secondary | ICD-10-CM

## 2017-03-17 DIAGNOSIS — E119 Type 2 diabetes mellitus without complications: Secondary | ICD-10-CM

## 2017-03-17 DIAGNOSIS — R296 Repeated falls: Secondary | ICD-10-CM | POA: Diagnosis present

## 2017-03-17 DIAGNOSIS — A4151 Sepsis due to Escherichia coli [E. coli]: Secondary | ICD-10-CM | POA: Diagnosis present

## 2017-03-17 DIAGNOSIS — R1310 Dysphagia, unspecified: Secondary | ICD-10-CM | POA: Diagnosis present

## 2017-03-17 DIAGNOSIS — R32 Unspecified urinary incontinence: Secondary | ICD-10-CM | POA: Diagnosis present

## 2017-03-17 DIAGNOSIS — R7309 Other abnormal glucose: Secondary | ICD-10-CM | POA: Diagnosis not present

## 2017-03-17 DIAGNOSIS — D500 Iron deficiency anemia secondary to blood loss (chronic): Secondary | ICD-10-CM | POA: Diagnosis not present

## 2017-03-17 DIAGNOSIS — I441 Atrioventricular block, second degree: Secondary | ICD-10-CM | POA: Diagnosis present

## 2017-03-17 DIAGNOSIS — R6520 Severe sepsis without septic shock: Secondary | ICD-10-CM | POA: Diagnosis present

## 2017-03-17 DIAGNOSIS — N390 Urinary tract infection, site not specified: Secondary | ICD-10-CM | POA: Diagnosis present

## 2017-03-17 DIAGNOSIS — R159 Full incontinence of feces: Secondary | ICD-10-CM | POA: Diagnosis present

## 2017-03-17 DIAGNOSIS — J9602 Acute respiratory failure with hypercapnia: Secondary | ICD-10-CM

## 2017-03-17 DIAGNOSIS — I10 Essential (primary) hypertension: Secondary | ICD-10-CM | POA: Diagnosis present

## 2017-03-17 DIAGNOSIS — E785 Hyperlipidemia, unspecified: Secondary | ICD-10-CM | POA: Diagnosis present

## 2017-03-17 DIAGNOSIS — A419 Sepsis, unspecified organism: Secondary | ICD-10-CM | POA: Diagnosis not present

## 2017-03-17 DIAGNOSIS — I482 Chronic atrial fibrillation: Secondary | ICD-10-CM | POA: Diagnosis present

## 2017-03-17 DIAGNOSIS — J9601 Acute respiratory failure with hypoxia: Secondary | ICD-10-CM | POA: Diagnosis not present

## 2017-03-17 DIAGNOSIS — J90 Pleural effusion, not elsewhere classified: Secondary | ICD-10-CM | POA: Diagnosis present

## 2017-03-17 DIAGNOSIS — H510 Palsy (spasm) of conjugate gaze: Secondary | ICD-10-CM

## 2017-03-17 DIAGNOSIS — D631 Anemia in chronic kidney disease: Secondary | ICD-10-CM | POA: Diagnosis present

## 2017-03-17 DIAGNOSIS — I1311 Hypertensive heart and chronic kidney disease without heart failure, with stage 5 chronic kidney disease, or end stage renal disease: Secondary | ICD-10-CM | POA: Diagnosis present

## 2017-03-17 DIAGNOSIS — E1121 Type 2 diabetes mellitus with diabetic nephropathy: Secondary | ICD-10-CM | POA: Diagnosis not present

## 2017-03-17 DIAGNOSIS — I639 Cerebral infarction, unspecified: Secondary | ICD-10-CM | POA: Diagnosis present

## 2017-03-17 DIAGNOSIS — E1122 Type 2 diabetes mellitus with diabetic chronic kidney disease: Secondary | ICD-10-CM | POA: Diagnosis present

## 2017-03-17 DIAGNOSIS — Z8673 Personal history of transient ischemic attack (TIA), and cerebral infarction without residual deficits: Secondary | ICD-10-CM

## 2017-03-17 DIAGNOSIS — R29898 Other symptoms and signs involving the musculoskeletal system: Secondary | ICD-10-CM

## 2017-03-17 DIAGNOSIS — G7001 Myasthenia gravis with (acute) exacerbation: Secondary | ICD-10-CM | POA: Diagnosis not present

## 2017-03-17 DIAGNOSIS — M5090 Cervical disc disorder, unspecified, unspecified cervical region: Secondary | ICD-10-CM | POA: Diagnosis present

## 2017-03-17 DIAGNOSIS — Z7901 Long term (current) use of anticoagulants: Secondary | ICD-10-CM

## 2017-03-17 DIAGNOSIS — Z978 Presence of other specified devices: Secondary | ICD-10-CM

## 2017-03-17 DIAGNOSIS — E876 Hypokalemia: Secondary | ICD-10-CM | POA: Diagnosis present

## 2017-03-17 DIAGNOSIS — Z7982 Long term (current) use of aspirin: Secondary | ICD-10-CM

## 2017-03-17 DIAGNOSIS — E1165 Type 2 diabetes mellitus with hyperglycemia: Secondary | ICD-10-CM | POA: Diagnosis present

## 2017-03-17 DIAGNOSIS — R531 Weakness: Secondary | ICD-10-CM

## 2017-03-17 DIAGNOSIS — D689 Coagulation defect, unspecified: Secondary | ICD-10-CM | POA: Diagnosis present

## 2017-03-17 DIAGNOSIS — D509 Iron deficiency anemia, unspecified: Secondary | ICD-10-CM | POA: Diagnosis present

## 2017-03-17 DIAGNOSIS — J9621 Acute and chronic respiratory failure with hypoxia: Secondary | ICD-10-CM | POA: Diagnosis not present

## 2017-03-17 DIAGNOSIS — Z992 Dependence on renal dialysis: Secondary | ICD-10-CM

## 2017-03-17 DIAGNOSIS — Z888 Allergy status to other drugs, medicaments and biological substances status: Secondary | ICD-10-CM

## 2017-03-17 DIAGNOSIS — N186 End stage renal disease: Secondary | ICD-10-CM | POA: Diagnosis present

## 2017-03-17 DIAGNOSIS — Z91048 Other nonmedicinal substance allergy status: Secondary | ICD-10-CM

## 2017-03-17 DIAGNOSIS — Z01818 Encounter for other preprocedural examination: Secondary | ICD-10-CM

## 2017-03-17 DIAGNOSIS — D72829 Elevated white blood cell count, unspecified: Secondary | ICD-10-CM | POA: Diagnosis not present

## 2017-03-17 DIAGNOSIS — J810 Acute pulmonary edema: Secondary | ICD-10-CM | POA: Diagnosis present

## 2017-03-17 DIAGNOSIS — D649 Anemia, unspecified: Secondary | ICD-10-CM | POA: Diagnosis present

## 2017-03-17 DIAGNOSIS — Z0189 Encounter for other specified special examinations: Secondary | ICD-10-CM

## 2017-03-17 DIAGNOSIS — I6789 Other cerebrovascular disease: Secondary | ICD-10-CM | POA: Diagnosis not present

## 2017-03-17 DIAGNOSIS — G9340 Encephalopathy, unspecified: Secondary | ICD-10-CM | POA: Diagnosis not present

## 2017-03-17 DIAGNOSIS — G7000 Myasthenia gravis without (acute) exacerbation: Secondary | ICD-10-CM

## 2017-03-17 DIAGNOSIS — E8889 Other specified metabolic disorders: Secondary | ICD-10-CM | POA: Diagnosis present

## 2017-03-17 DIAGNOSIS — R339 Retention of urine, unspecified: Secondary | ICD-10-CM | POA: Diagnosis present

## 2017-03-17 DIAGNOSIS — J9600 Acute respiratory failure, unspecified whether with hypoxia or hypercapnia: Secondary | ICD-10-CM

## 2017-03-17 DIAGNOSIS — I4821 Permanent atrial fibrillation: Secondary | ICD-10-CM | POA: Diagnosis present

## 2017-03-17 LAB — MAGNESIUM: Magnesium: 2.2 mg/dL (ref 1.7–2.4)

## 2017-03-17 LAB — COMPREHENSIVE METABOLIC PANEL
ALBUMIN: 3 g/dL — AB (ref 3.5–5.0)
ALT: 15 U/L — ABNORMAL LOW (ref 17–63)
ANION GAP: 12 (ref 5–15)
AST: 17 U/L (ref 15–41)
Alkaline Phosphatase: 34 U/L — ABNORMAL LOW (ref 38–126)
BILIRUBIN TOTAL: 0.7 mg/dL (ref 0.3–1.2)
BUN: 54 mg/dL — AB (ref 6–20)
CO2: 28 mmol/L (ref 22–32)
Calcium: 8.9 mg/dL (ref 8.9–10.3)
Chloride: 93 mmol/L — ABNORMAL LOW (ref 101–111)
Creatinine, Ser: 9.23 mg/dL — ABNORMAL HIGH (ref 0.61–1.24)
GFR calc Af Amer: 6 mL/min — ABNORMAL LOW (ref >60)
GFR calc non Af Amer: 5 mL/min — ABNORMAL LOW (ref >60)
GLUCOSE: 147 mg/dL — AB (ref 65–99)
POTASSIUM: 3.3 mmol/L — AB (ref 3.5–5.1)
SODIUM: 133 mmol/L — AB (ref 135–145)
TOTAL PROTEIN: 5.5 g/dL — AB (ref 6.5–8.1)

## 2017-03-17 LAB — CBC WITH DIFFERENTIAL/PLATELET
Basophils Absolute: 0 10*3/uL (ref 0.0–0.1)
Basophils Relative: 0 %
EOS PCT: 0 %
Eosinophils Absolute: 0 10*3/uL (ref 0.0–0.7)
HEMATOCRIT: 25.5 % — AB (ref 39.0–52.0)
Hemoglobin: 8.1 g/dL — ABNORMAL LOW (ref 13.0–17.0)
LYMPHS ABS: 0.7 10*3/uL (ref 0.7–4.0)
LYMPHS PCT: 6 %
MCH: 30.6 pg (ref 26.0–34.0)
MCHC: 31.8 g/dL (ref 30.0–36.0)
MCV: 96.2 fL (ref 78.0–100.0)
MONO ABS: 0.7 10*3/uL (ref 0.1–1.0)
MONOS PCT: 7 %
NEUTROS ABS: 8.8 10*3/uL — AB (ref 1.7–7.7)
Neutrophils Relative %: 87 %
PLATELETS: 184 10*3/uL (ref 150–400)
RBC: 2.65 MIL/uL — ABNORMAL LOW (ref 4.22–5.81)
RDW: 14.2 % (ref 11.5–15.5)
WBC: 10.2 10*3/uL (ref 4.0–10.5)

## 2017-03-17 LAB — PROTIME-INR
INR: 5.9 — AB
PROTHROMBIN TIME: 54.6 s — AB (ref 11.4–15.2)

## 2017-03-17 LAB — GLUCOSE, CAPILLARY
GLUCOSE-CAPILLARY: 163 mg/dL — AB (ref 65–99)
GLUCOSE-CAPILLARY: 159 mg/dL — AB (ref 65–99)

## 2017-03-17 LAB — POC OCCULT BLOOD, ED: Fecal Occult Bld: POSITIVE — AB

## 2017-03-17 LAB — I-STAT TROPONIN, ED: Troponin i, poc: 0 ng/mL (ref 0.00–0.08)

## 2017-03-17 LAB — PHOSPHORUS: PHOSPHORUS: 3.5 mg/dL (ref 2.5–4.6)

## 2017-03-17 LAB — BRAIN NATRIURETIC PEPTIDE: B NATRIURETIC PEPTIDE 5: 479.7 pg/mL — AB (ref 0.0–100.0)

## 2017-03-17 MED ORDER — ACETAMINOPHEN 650 MG RE SUPP
650.0000 mg | Freq: Four times a day (QID) | RECTAL | Status: DC | PRN
  Administered 2017-03-18: 650 mg via RECTAL
  Filled 2017-03-17: qty 1

## 2017-03-17 MED ORDER — SODIUM CHLORIDE 0.9% FLUSH
3.0000 mL | Freq: Two times a day (BID) | INTRAVENOUS | Status: DC
  Administered 2017-03-18 – 2017-03-21 (×6): 3 mL via INTRAVENOUS

## 2017-03-17 MED ORDER — INSULIN ASPART 100 UNIT/ML ~~LOC~~ SOLN: 0.0000 [IU] | SUBCUTANEOUS | Status: DC

## 2017-03-17 MED ORDER — IMMUNE GLOBULIN (HUMAN) 20 GM/200ML IV SOLN
400.0000 mg/kg | INTRAVENOUS | Status: AC
  Administered 2017-03-17 – 2017-03-21 (×5): 30 g via INTRAVENOUS
  Filled 2017-03-17 (×6): qty 100

## 2017-03-17 MED ORDER — ONDANSETRON HCL 4 MG PO TABS: 4.0000 mg | ORAL_TABLET | Freq: Four times a day (QID) | ORAL | Status: DC | PRN

## 2017-03-17 MED ORDER — INSULIN ASPART 100 UNIT/ML ~~LOC~~ SOLN: 0.0000 [IU] | Freq: Every day | SUBCUTANEOUS | Status: DC

## 2017-03-17 MED ORDER — ONDANSETRON HCL 4 MG/2ML IJ SOLN
4.0000 mg | Freq: Four times a day (QID) | INTRAMUSCULAR | Status: DC | PRN
  Filled 2017-03-17: qty 2

## 2017-03-17 MED ORDER — INSULIN ASPART 100 UNIT/ML ~~LOC~~ SOLN: 0.0000 [IU] | Freq: Three times a day (TID) | SUBCUTANEOUS | Status: DC

## 2017-03-17 MED ORDER — WARFARIN - PHARMACIST DOSING INPATIENT: Freq: Every day | Status: DC

## 2017-03-17 MED ORDER — ACETAMINOPHEN 325 MG PO TABS
650.0000 mg | ORAL_TABLET | Freq: Four times a day (QID) | ORAL | Status: DC | PRN
  Administered 2017-03-21 – 2017-03-26 (×6): 650 mg via ORAL
  Filled 2017-03-17 (×5): qty 2

## 2017-03-17 NOTE — ED Provider Notes (Signed)
[facility] DEPT Provider Note   CSN: [id number] Arrival date & time: 03/17/17  1205     History   Chief Complaint Chief Complaint  Patient presents with  . Weakness    HPI James David is a 81 y.o. male.  James David is a 81 y.o. Male with a history of a Fib on Coumadin, ESRD on HD MWF, CVA, diabetes and myasthenia gravis who presents to the emergency department complaining of increasing generalized weakness over the past 2 days. Patient has been having trouble with generalized weakness now for several weeks. He was recently diagnosed with myasthenia gravis by neurology. He was started on pyridostigmine by neurology and now takes 1/2 tablet (30 mg) twice a day. He was having problems with diarrhea at higher doses. Wife is at bedside and reports that over the past several weeks she's had generalized weakness, however he's been able to ambulate using a walker. She reports her the last 2 days they've been unable to get him out of bed and he is been unable to ambulate using the walker. Patient reports generalized weakness and feels fatigued. He was having frequent falls, however he has not had a fall in a week and a half now. No diarrhea in a week and a half. He is on dialysis Monday, Wednesday and Friday. He didn't did not receive dialysis today. He makes little to no urine. He denies fevers, chest pain, coughing, shortness of breath, abdominal pain, nausea, vomiting, diarrhea, focal weakness, headache, changes to his vision, neck pain, or rashes.    The history is provided by the patient and medical records. No language interpreter was used.  Weakness  Pertinent negatives include no shortness of breath, no chest pain, no vomiting and no headaches.    Past Medical History:  Diagnosis Date  . Atrial fibrillation ([place])   . Chronic kidney disease, stage IV (severe) (HCC)    baseline creatinine 2-3  . DDD (degenerative disc disease)   . Diabetes mellitus without complication ([place])     . ESRD (end stage renal disease) on dialysis ([place])   . Hyperlipidemia   . Hypertension   . Melanoma ([place]) 2010   removed at [facility]  . Second degree heart block    a. s/p STJ dual chamber PPM - Dr [name]  . Stroke [facility]) 01/2017    Patient Active Problem List   Diagnosis Date Noted  . Generalized weakness 03/17/2017  . Low back pain 02/28/2017  . Neck pain 02/28/2017  . Polyneuropathy 02/28/2017  . Gait disturbance, post-stroke   . Subcortical infarction ([place]) 02/06/2017  . Intracranial vascular stenosis 02/04/2017  . Syncope   . Orthostatic hypotension   . Stroke ([place]) 02/01/2017  . TIA (transient ischemic attack) 01/29/2017  . Hypertension 01/29/2017  . CKD (chronic kidney disease) stage V requiring chronic dialysis ([place]) 01/29/2017  . DDD (degenerative disc disease) 01/29/2017  . HLD (hyperlipidemia) 01/29/2017  . Second degree heart block 01/29/2017  . Atrial flutter ([place]) 01/29/2017  . ESRD (end stage renal disease) on dialysis ([place])   . Melanoma of skin ([place]) 03/13/2016  . Atrial flutter ([place]) 06/26/2015  . Pacemaker 10/13/2014  . End stage renal disease ([place]) 07/15/2014  . Chronic diastolic heart failure ([place]) 03/24/2014  . Second-degree heart block 12/13/2013  . Chronic kidney disease, stage IV (severe) ([place]) 12/13/2013  . Essential hypertension 12/13/2013  . Diabetes mellitus due to underlying condition with diabetic nephropathy ([place]) 12/13/2013  . Hyperlipidemia 12/13/2013    Past Surgical  History:  Procedure Laterality Date  . AV FISTULA PLACEMENT Right 05/31/2014   Procedure: Right Arm Brachiocephalic ARTERIOVENOUS FISTULA CREATION  ;  Surgeon: Conrad Dushore, MD;  Location: [facility];  Service: Vascular;  Laterality: Right;  . CATARACT EXTRACTION W/ INTRAOCULAR LENS  IMPLANT, BILATERAL    . COLONOSCOPY    . EXCISION MELANOMA WITH SENTINEL LYMPH NODE BIOPSY Right 02/08/2016   Procedure: WIDE EXCISION RIGHT SHOULDER MELANOMA WITH RIGHT SENTINEL LYMPH NODE BIOPSY;   Surgeon: [name], MD;  Location: [facility];  Service: General;  Laterality: Right;  . LAMINECTOMY    . PERMANENT PACEMAKER INSERTION N/A 01/20/2014   STJ Assurity dual chamber pacemaker implanted by Dr [name] for 2nd degree AV block       Home Medications    Prior to Admission medications   Medication Sig Start Date End Date Taking? Authorizing Provider  acetaminophen (TYLENOL) 500 MG tablet Take 500 mg by mouth daily as needed for mild pain. (pain/headaches)    [provider]  aspirin EC 81 MG tablet Take 81 mg by mouth at bedtime.    [provider]  calcium acetate (PHOSLO) 667 MG capsule Take 2 capsules (1,334 mg total) by mouth 3 (three) times daily. 02/14/17   Angiulli, Lavon Paganini, PA-C  carvedilol (COREG) 3.125 MG tablet Take 1 tablet (3.125 mg total) by mouth 2 (two) times daily with a meal. 02/14/17   Angiulli, Lavon Paganini, PA-C  ezetimibe (ZETIA) 10 MG tablet Take 1 tablet (10 mg total) by mouth daily. 02/14/17   Angiulli, Lavon Paganini, PA-C  fenofibrate 160 MG tablet Take 1 tablet (160 mg total) by mouth daily. 02/14/17   Angiulli, Lavon Paganini, PA-C  finasteride (PROSCAR) 5 MG tablet Take 1 tablet (5 mg total) by mouth daily. 02/14/17   Angiulli, Lavon Paganini, PA-C  gabapentin (NEURONTIN) 100 MG capsule Take 1 capsule (100 mg total) by mouth at bedtime. 02/14/17   Angiulli, Lavon Paganini, PA-C  glimepiride (AMARYL) 1 MG tablet Take 1 tablet (1 mg total) by mouth daily with breakfast. 02/14/17   Angiulli, Lavon Paganini, PA-C  Hypromellose (ARTIFICIAL TEARS OP) Place 1 drop into both eyes daily as needed (supplement).    [provider]  ipratropium (ATROVENT) 0.03 % nasal spray Place 2 sprays into both nostrils daily.     [provider]  multivitamin (RENA-VIT) TABS tablet Take 1 tablet by mouth at bedtime. 02/14/17   Angiulli, Lavon Paganini, PA-C  pyridostigmine (MESTINON) 60 MG tablet Take 1/2 to 1 pill po tid 03/03/17   Sater, Nanine Means, MD  rOPINIRole (REQUIP) 0.5 MG tablet Take 1  tablet (0.5 mg total) by mouth at bedtime. 02/14/17   Angiulli, Lavon Paganini, PA-C  senna-docusate (SENOKOT-S) 8.6-50 MG tablet Take 2 tablets by mouth at bedtime. Patient not taking: Reported on 03/04/2017 02/14/17   Angiulli, Lavon Paganini, PA-C  warfarin (COUMADIN) 1 MG tablet 1 1/2 tab daily Patient taking differently: Take 1.5 mg by mouth daily at 6 PM.  02/14/17   Angiulli, Lavon Paganini, PA-C    Family History Family History  Problem Relation Age of Onset  . Pulmonary embolism Mother   . Diabetes Mother   . Heart attack Father   . Diabetes Brother     Social History Social History  Substance Use Topics  . Smoking status: Never Smoker  . Smokeless tobacco: Never Used  . Alcohol use No     Allergies   Amlodipine besylate; Lisinopril; and Statins   Review of  Systems Review of Systems  Constitutional: Positive for fatigue. Negative for chills and fever.  HENT: Negative for congestion and sore throat.   Eyes: Negative for visual disturbance.  Respiratory: Negative for cough and shortness of breath.   Cardiovascular: Negative for chest pain and palpitations.  Gastrointestinal: Negative for abdominal pain, diarrhea, nausea and vomiting.  Genitourinary:       Makes no urine   Musculoskeletal: Negative for back pain and neck pain.  Skin: Negative for rash.  Neurological: Positive for weakness. Negative for syncope, light-headedness, numbness and headaches.     Physical Exam Updated Vital Signs BP (!) 146/47   Pulse 85   Temp 99.3 F (37.4 C) (Oral)   Resp (!) 26   Ht 5\' 10"  (1.778 m)   SpO2 99%   Physical Exam  Constitutional: He is oriented to person, place, and time. He appears well-developed and well-nourished. No distress.  Nontoxic appearing.  HENT:  Head: Normocephalic and atraumatic.  Right Ear: External ear normal.  Left Ear: External ear normal.  Mouth/Throat: Oropharynx is clear and moist.  Eyes: Conjunctivae and EOM are normal. Pupils are equal, round, and reactive  to light. Right eye exhibits no discharge. Left eye exhibits no discharge.  Neck: Neck supple. No JVD present.  Cardiovascular: Normal rate, regular rhythm, normal heart sounds and intact distal pulses.  Exam reveals no gallop and no friction rub.   No murmur heard. Pulmonary/Chest: Effort normal and breath sounds normal. No stridor. No respiratory distress. He has no wheezes. He has no rales.  Lungs are clear to ascultation bilaterally. Symmetric chest expansion bilaterally. No increased work of breathing. No rales or rhonchi.    Abdominal: Soft. There is no tenderness. There is no guarding.  Genitourinary:  Genitourinary Comments: Digital rectal exam with male nurse tech as chaperone. No gross bloody stool on exam. Guaiac is positive.  Musculoskeletal: Normal range of motion. He exhibits no edema or tenderness.  Lymphadenopathy:    He has no cervical adenopathy.  Neurological: He is alert and oriented to person, place, and time. No cranial nerve deficit or sensory deficit. He exhibits normal muscle tone. Coordination normal.  Patient is alert and oriented 3. Cranial nerves are intact. Speech is clear and coherent. EOMs are intact. No nystagmus. Finger to nose intact bilaterally. Patient has 4-5 strength in his bilateral upper and lower extremities. Equal grip strength bilaterally. No facial droop. Hearing is grossly intact. Vision is grossly intact. Tongue is midline.  Skin: Skin is warm and dry. Capillary refill takes less than 2 seconds. No rash noted. He is not diaphoretic. No erythema. No pallor.  Psychiatric: He has a normal mood and affect. His behavior is normal.  Nursing note and vitals reviewed.    ED Treatments / Results  Labs (all labs ordered are listed, but only abnormal results are displayed) Labs Reviewed  COMPREHENSIVE METABOLIC PANEL - Abnormal; Notable for the following:       Result Value   Sodium 133 (*)    Potassium 3.3 (*)    Chloride 93 (*)    Glucose, Bld 147  (*)    BUN 54 (*)    Creatinine, Ser 9.23 (*)    Total Protein 5.5 (*)    Albumin 3.0 (*)    ALT 15 (*)    Alkaline Phosphatase 34 (*)    GFR calc non Af Amer 5 (*)    GFR calc Af Amer 6 (*)    All other components within normal limits  CBC WITH DIFFERENTIAL/PLATELET - Abnormal; Notable for the following:    RBC 2.65 (*)    Hemoglobin 8.1 (*)    HCT 25.5 (*)    Neutro Abs 8.8 (*)    All other components within normal limits  BRAIN NATRIURETIC PEPTIDE - Abnormal; Notable for the following:    B Natriuretic Peptide 479.7 (*)    All other components within normal limits  PROTIME-INR - Abnormal; Notable for the following:    Prothrombin Time 54.6 (*)    INR 5.90 (*)    All other components within normal limits  POC OCCULT BLOOD, ED - Abnormal; Notable for the following:    Fecal Occult Bld POSITIVE (*)    All other components within normal limits  I-STAT TROPOININ, ED    EKG  EKG Interpretation  Date/Time:  Monday March 17 2017 12:58:15 EDT Ventricular Rate:  73 PR Interval:    QRS Duration: 178 QT Interval:  471 QTC Calculation: 520 R Axis:   -82 Text Interpretation:  Atrial-sensed ventricular-paced rhythm No further analysis attempted due to paced rhythm When compared to prior, no significant changes. No STEMI.  Confirmed by Antony Blackbird [phone]) on 03/17/2017 1:43:13 PM       Radiology Dg Chest 2 View  Result Date: 03/17/2017 CLINICAL DATA:  Generalize weakness for the past 3 days. History of atrial fibrillation, diabetes, end-stage renal disease. EXAM: CHEST  2 VIEW COMPARISON:  Chest x-ray of February 19, 2017 FINDINGS: The lungs are adequately inflated and clear. The cardiac silhouette is mildly enlarged. The pulmonary vascularity is normal. The mediastinum is normal in width. The ICD is in stable position. The trachea is midline. The bony thorax exhibits no acute abnormality. There surgical clips in the left axillary region. IMPRESSION: There is no pneumonia, CHF, nor other  acute cardiopulmonary abnormality. Electronically Signed   By: David  Martinique M.D.   On: 03/17/2017 13:41    Procedures Procedures (including critical care time)  Medications Ordered in ED Medications - No data to display   Initial Impression / Assessment and Plan / ED Course  I have reviewed the triage vital signs and the nursing notes.  Pertinent labs & imaging results that were available during my care of the patient were reviewed by me and considered in my medical decision making (see chart for details).    This is a 81 y.o. Male with a history of a Fib on Coumadin, ESRD on HD MWF, CVA, diabetes and myasthenia gravis who presents to the emergency department complaining of increasing generalized weakness over the past 2 days. Patient has been having trouble with generalized weakness now for several weeks. He was recently diagnosed with myasthenia gravis by neurology. He was started on pyridostigmine by neurology and now takes 1/2 tablet (30 mg) twice a day. He was having problems with diarrhea at higher doses. Wife is at bedside and reports that over the past several weeks she's had generalized weakness, however he's been able to ambulate using a walker. She reports her the last 2 days they've been unable to get him out of bed and he is been unable to ambulate using the walker. Patient reports generalized weakness and feels fatigued. He was having frequent falls, however he has not had a fall in a week and a half now. No diarrhea in a week and a half. He is on dialysis Monday, Wednesday and Friday. He didn't did not receive dialysis today. He makes little to no urine.   On exam the  patient is afebrile nontoxic appearing. He has no focal neurological deficits on exam. He has 4 out of 5 strength in his bilateral upper and lower extremities.  Blood work here reveals a creatinine of 9.23. Sodium of 133 and a potassium of 3.3. Patient did not receive dialysis today. INR is elevated at 5.90.  Troponin is not elevated. CBC reveals a hemoglobin of 8.1. This is about a gram and a half decreased from his baseline. Hemoccult card was positive. No gross bloody stool on exam. Chest x-ray shows no acute cardiopulmonary abnormality.  An incision for this patient with generalized weakness and his history of myasthenia gravis. He also has guaiac positive stool with a drop in his hemoglobin and elevated INR. Will plan for admission. Patient and family are in agreement with admission.   I consulted with neurologist Dr. Leonel Ramsay who will see the patient in consult and agrees with plan for admission.   I consulted with Ebony Hail from [facility] who accepted the patient for admission.  This patient was discussed with and evaluated by Dr. Sherry Ruffing who agrees with assessment and plan.   Final Clinical Impressions(s) / ED Diagnoses   Final diagnoses:  Myasthenia gravis ([place])  ESRD on dialysis ([place])  Generalized weakness  Elevated INR  Occult blood in stools    New Prescriptions New Prescriptions   No medications on file     Waynetta Pean, Hershal Coria 03/17/17 1546    Tegeler, Gwenyth Allegra, MD 03/24/17 [phone]

## 2017-03-17 NOTE — Consult Note (Signed)
Neurology Consultation Reason for Consult: Generalized weakness Referring Physician: Rama, C  CC: Generalized weakness  History is obtained from: Patient  HPI: James David is a 81 y.o. male with a history of cervical spine disease who presents with generalized weakness. He was evaluated for this as an outpatient and had acetylcholine receptor antibodies checked which were positive. He has been getting progressively weaker for the past month or 2. He was started on Mestinon, but did not tolerate it and had severe diarrhea. He is not sure if it helped or not.  He has continued getting weaker, and is now having difficulty getting out of bed. He is also complaining of difficulty swallowing, stating that things get stuck on the way down. He denies any diplopia or shortness of breath.   He states that he has had an involuntary jump of his left leg that has been going on since his previous neck surgery. He and his wife have noticed it getting worse over the past few weeks as well.  ROS: A 14 point ROS was performed and is negative except as noted in the HPI.   Past Medical History:  Diagnosis Date  . Atrial fibrillation ([place])   . Chronic kidney disease, stage IV (severe) (HCC)    baseline creatinine 2-3  . DDD (degenerative disc disease)   . Diabetes mellitus without complication ([place])   . ESRD (end stage renal disease) on dialysis ([place])   . Hyperlipidemia   . Hypertension   . Melanoma ([place]) 2010   removed at [facility]  . Second degree heart block    a. s/p STJ dual chamber PPM - Dr [name]  . Stroke [facility]) 01/2017     Family History  Problem Relation Age of Onset  . Pulmonary embolism Mother   . Diabetes Mother   . Heart attack Father   . Diabetes Brother      Social History:  reports that he has never smoked. He has never used smokeless tobacco. He reports that he does not drink alcohol or use drugs.   Exam: Current vital signs: BP (!) 155/56 (BP Location: Left Arm)   Pulse  (!) 156   Temp 98.9 F (37.2 C) (Oral)   Resp 16   Ht 5\' 10"  (1.778 m)   Wt 79 kg (174 lb 2.6 oz)   SpO2 98%   BMI 24.99 kg/m  Vital signs in last 24 hours: Temp:  [98.9 F (37.2 C)-99.3 F (37.4 C)] 98.9 F (37.2 C) (07/02 1725) Pulse Rate:  [73-156] 156 (07/02 1725) Resp:  [10-29] 16 (07/02 1725) BP: (125-167)/(44-82) 155/56 (07/02 1725) SpO2:  [94 %-100 %] 98 % (07/02 1725) Weight:  [79 kg (174 lb 2.6 oz)] 79 kg (174 lb 2.6 oz) (07/02 1722)   Physical Exam  Constitutional: Appears well-developed and well-nourished.  Psych: Affect appropriate to situation Eyes: No scleral injection HENT: No OP obstrucion Head: Normocephalic.  Cardiovascular: Normal rate and regular rhythm.  Respiratory: Effort normal and breath sounds normal to anterior ascultation GI: Soft.  No distension. There is no tenderness.  Skin: WDI  Neuro: Mental Status: Patient is awake, alert, oriented to person, place, month, year, and situation. Patient is able to give a clear and coherent history. No signs of aphasia or neglect Cranial Nerves: II: Visual Fields are full. Pupils are equal, round, and reactive to light.   III,IV, VI: EOMI without ptosis or diploplia.  V: Facial sensation is symmetric to temperature VII: Facial movement is symmetric.  VIII:  hearing is intact to voice X: Uvula elevates symmetrically XI: Shoulder shrug is symmetric. XII: tongue is midline without atrophy or fasciculations.  Motor: Tone is normal. Bulk is normal. 5/5 strength was present in all four extremities.  Sensory: Sensation is symmetric to light touch and temperature in the arms and legs. Deep Tendon Reflexes: 2+ and symmetric at the biceps, 3+ bilaterally at the knees, with the left being more brisk than the right, 2+ at the ankles bilaterally Plantars: Toes are downgoing bilaterally.  Cerebellar: FNF and HKS are intact bilaterally      I have reviewed labs in epic and the results pertinent to this  consultation are: Elevated creatinine, mildly decreased sodium,  I have reviewed the images obtained: CT cervical spine from 6/15 - no definite acute findings  Impression: 81 year old male with progressive diffuse weakness. His hyperreflexia is concerning, but the fact that he thinks this is long-standing and the fact that he is having difficulty swallowing I think are more supportive of myasthenia as an etiology of his weakness. General asthenia from his renal failure and anemia are also possible, but given that these are long-standing I think is less likely.  At this point, I would favor treating him with IVIG in assessing for response.  Recommendations: 1) IVIG 2g/kg divided over 5 daily doses 2) given degree of diarrhea that he is describing, would hold Mestinon 3) I would favor waiting a couple of days prior to starting steroids until the IVIG has a chance to take effect 4) treatment of anemia/renal failure per internal medicine/nephrology 5) neurology will continue to follow  Roland Rack, MD [facility] [phone]  If 7pm- 7am, please page neurology on call as listed in [place].

## 2017-03-17 NOTE — H&P (Signed)
History and Physical    BRAX WALEN DZH:[id number] DOB: 1936/02/08 DOA: 03/17/2017   PCP: [name], MD   Patient coming from/Resides with: Private residence/wife  Chief Complaint: Generalized weakness, inability to stand or walk  HPI: James David is a 81 y.o. male with medical history significant for diabetes mellitus 2 on oral agents, chronic kidney disease on dialysis, atrial fibrillation on chronic warfarin, anemia of chronic kidney disease, dyslipidemia. Recent admission for right subcortical stroke mid May 2018. Patient completed therapy in CIR and was discharged home at that time he was able to mobilize with the assistance of a rolling walker. He has also followed up with neurology since that admission and due to persistent weakness not explained by patient's recent stroke other neurological causes were investigated. He has subsequently been diagnosed as a mid June with myasthenia gravis. He initially started taking low-dose Mestinon 15 mg 3 times a day but had severe nonbloody diarrhea and therefore dosage was decreased to twice a day.   Patient began having more progressive weakness beginning this past Saturday and is no longer able to get out of bed without help. Because of his generalized weakness he was unable to attend dialysis today. He denies any shortness of breath but he has noted some difficulty swallowing without any choking or coughing with eating and he has been able to manage his oral secretions. In the ER chest x-ray was unremarkable. INR supratherapeutic at 5.9. He did have low-grade temperature 99.3 with borderline tachypnea but was otherwise normotensive and was maintaining O2 saturations 100%. He was also found to be Hemoccult positive without any gross blood noted on DRE. His symptoms are concerning for possible evolving myasthenia crisis therefore neurology has been consulted. We have been asked to admit the patient.  ED Course:  Vital Signs: BP (!) 146/47    Pulse 85   Temp 99.3 F (37.4 C) (Oral)   Resp (!) 26   Ht 5\' 10"  (1.778 m)   SpO2 99%  2 view CXR: No acute cardiopulmonary process Lab data: Sodium 133, potassium 3.3, chloride 93, CO2 28, glucose 147, BUN 54, creatinine 9.239 12, LFTs normal, BNP 480, POC troponin 0.00, white count 10,000 with neutrophils 87% absolute neutrophils 8.8%, hemoglobin 8.1, platelets 184,000, PTT 54.6, INR 5.9, fob positive Medications and treatments: None  Review of Systems:  In addition to the HPI above,  No Fever-chills, myalgias or other constitutional symptoms No Headache, changes with Vision or hearing, dizziness, dysarthria or word finding difficulty, gait disturbance or imbalance, seizure activity No indigestion/reflux, choking or coughing while eating, abdominal pain with or after eating No Chest pain, Cough or Shortness of Breath, palpitations, orthopnea or DOE No Abdominal pain, N/V, melena,hematochezia, dark tarry stools, constipation No dysuria, malodorous urine, hematuria or flank pain No new skin rashes, lesions, masses or bruises, No new joint pains, aches, swelling or redness No recent unintentional weight gain or loss No polyuria, polydypsia or polyphagia   Past Medical History:  Diagnosis Date  . Atrial fibrillation ([place])   . Chronic kidney disease, stage IV (severe) (HCC)    baseline creatinine 2-3  . DDD (degenerative disc disease)   . Diabetes mellitus without complication ([place])   . ESRD (end stage renal disease) on dialysis ([place])   . Hyperlipidemia   . Hypertension   . Melanoma ([place]) 2010   removed at [facility]  . Second degree heart block    a. s/p STJ dual chamber PPM - Dr [name]  .  Stroke [facility]) 01/2017    Past Surgical History:  Procedure Laterality Date  . AV FISTULA PLACEMENT Right 05/31/2014   Procedure: Right Arm Brachiocephalic ARTERIOVENOUS FISTULA CREATION  ;  Surgeon: Conrad Humboldt Hill, MD;  Location: [place];  Service: Vascular;  Laterality: Right;  . CATARACT  EXTRACTION W/ INTRAOCULAR LENS  IMPLANT, BILATERAL    . COLONOSCOPY    . EXCISION MELANOMA WITH SENTINEL LYMPH NODE BIOPSY Right 02/08/2016   Procedure: WIDE EXCISION RIGHT SHOULDER MELANOMA WITH RIGHT SENTINEL LYMPH NODE BIOPSY;  Surgeon: [name], MD;  Location: [place];  Service: General;  Laterality: Right;  . LAMINECTOMY    . PERMANENT PACEMAKER INSERTION N/A 01/20/2014   STJ Assurity dual chamber pacemaker implanted by Dr [name] for 2nd degree AV block    Social History   Social History  . Marital status: Married    Spouse name: [name]  . Number of children: N/A  . Years of education: N/A   Occupational History  .      retired   Social History Main Topics  . Smoking status: Never Smoker  . Smokeless tobacco: Never Used  . Alcohol use No  . Drug use: No  . Sexual activity: No   Other Topics Concern  . Not on file   Social History Narrative   Lives in [place] with spouse.  Retired Theatre manager for [employer].    Mobility: Rolling walker Work history: Not obtained   Allergies  Allergen Reactions  . Amlodipine Besylate Swelling    angioedema  . Lisinopril Other (See Comments)    Renal insufficiency  . Statins Other (See Comments)    Muscle aches    Family History  Problem Relation Age of Onset  . Pulmonary embolism Mother   . Diabetes Mother   . Heart attack Father   . Diabetes Brother     Prior to Admission medications   Medication Sig Start Date End Date Taking? Authorizing Provider  acetaminophen (TYLENOL) 500 MG tablet Take 500 mg by mouth daily as needed for mild pain. (pain/headaches)    [provider]  aspirin EC 81 MG tablet Take 81 mg by mouth at bedtime.    [provider]  calcium acetate (PHOSLO) 667 MG capsule Take 2 capsules (1,334 mg total) by mouth 3 (three) times daily. 02/14/17   Angiulli, Lavon Paganini, PA-C  carvedilol (COREG) 3.125 MG tablet Take 1 tablet (3.125 mg total) by mouth 2 (two) times daily with a meal.  02/14/17   Angiulli, Lavon Paganini, PA-C  ezetimibe (ZETIA) 10 MG tablet Take 1 tablet (10 mg total) by mouth daily. 02/14/17   Angiulli, Lavon Paganini, PA-C  fenofibrate 160 MG tablet Take 1 tablet (160 mg total) by mouth daily. 02/14/17   Angiulli, Lavon Paganini, PA-C  finasteride (PROSCAR) 5 MG tablet Take 1 tablet (5 mg total) by mouth daily. 02/14/17   Angiulli, Lavon Paganini, PA-C  gabapentin (NEURONTIN) 100 MG capsule Take 1 capsule (100 mg total) by mouth at bedtime. 02/14/17   Angiulli, Lavon Paganini, PA-C  glimepiride (AMARYL) 1 MG tablet Take 1 tablet (1 mg total) by mouth daily with breakfast. 02/14/17   Angiulli, Lavon Paganini, PA-C  Hypromellose (ARTIFICIAL TEARS OP) Place 1 drop into both eyes daily as needed (supplement).    [provider]  ipratropium (ATROVENT) 0.03 % nasal spray Place 2 sprays into both nostrils daily.     [provider]  multivitamin (RENA-VIT) TABS tablet Take 1 tablet by mouth at bedtime.  02/14/17   Angiulli, Lavon Paganini, PA-C  pyridostigmine (MESTINON) 60 MG tablet Take 1/2 to 1 pill po tid 03/03/17   Sater, Nanine Means, MD  rOPINIRole (REQUIP) 0.5 MG tablet Take 1 tablet (0.5 mg total) by mouth at bedtime. 02/14/17   Angiulli, Lavon Paganini, PA-C  senna-docusate (SENOKOT-S) 8.6-50 MG tablet Take 2 tablets by mouth at bedtime. Patient not taking: Reported on 03/04/2017 02/14/17   Angiulli, Lavon Paganini, PA-C  warfarin (COUMADIN) 1 MG tablet 1 1/2 tab daily Patient taking differently: Take 1.5 mg by mouth daily at 6 PM.  02/14/17   Cathlyn Parsons, PA-C    Physical Exam: Vitals:   03/17/17 1415 03/17/17 1430 03/17/17 1445 03/17/17 1500  BP: (!) 159/68 (!) 158/71 (!) 147/48 (!) 146/47  Pulse: 77 93 83 85  Resp: (!) 22 (!) 26 (!) 24 (!) 26  Temp:      TempSrc:      SpO2: 97% 100% 96% 99%  Height:          Constitutional: NAD, calm, comfortable Eyes: PERRL, lids and conjunctivae normal ENMT: Mucous membranes are moist. Posterior pharynx clear of any exudate or lesions.Normal dentition.    Neck: normal, supple, no masses, no thyromegaly Respiratory: clear to auscultation bilaterally, no wheezing, no crackles. Normal respiratory effort. No accessory muscle use.  Cardiovascular: Regular rate and rhythm, no murmurs / rubs / gallops. No extremity edema. 2+ pedal pulses. No carotid bruits.  Abdomen: no tenderness, no masses palpated. No hepatosplenomegaly. Bowel sounds positive.  Musculoskeletal: no clubbing / cyanosis. No joint deformity upper and lower extremities. Good ROM, no contractures. Normal muscle tone.  Skin: no rashes, lesions, ulcers. No induration Neurologic: CN 2-12 grossly intact-did not appreciate any lid ptosis. Sensation intact, DTR normal upper extremities but hyperreflexive and lower extremities: Right 3-4+, Left 4+ with posttesting clonus noted. Strength 5/5 upper extremities and 4/5 lower extremities; patient with resting tonic muscle spasm left leg Psychiatric: Normal judgment and insight. Alert and oriented x 3. Normal mood.    Labs on Admission: I have personally reviewed following labs and imaging studies  CBC:  Recent Labs Lab 03/17/17 1257  WBC 10.2  NEUTROABS 8.8*  HGB 8.1*  HCT 25.5*  MCV 96.2  PLT 191   Basic Metabolic Panel:  Recent Labs Lab 03/17/17 1257  NA 133*  K 3.3*  CL 93*  CO2 28  GLUCOSE 147*  BUN 54*  CREATININE 9.23*  CALCIUM 8.9   GFR: CrCl cannot be calculated (Unknown ideal weight.). Liver Function Tests:  Recent Labs Lab 03/17/17 1257  AST 17  ALT 15*  ALKPHOS 34*  BILITOT 0.7  PROT 5.5*  ALBUMIN 3.0*   No results for input(s): LIPASE, AMYLASE in the last 168 hours. No results for input(s): AMMONIA in the last 168 hours. Coagulation Profile:  Recent Labs Lab 03/17/17 1304  INR 5.90*   Cardiac Enzymes: No results for input(s): CKTOTAL, CKMB, CKMBINDEX, TROPONINI in the last 168 hours. BNP (last 3 results) No results for input(s): PROBNP in the last 8760 hours. HbA1C: No results for input(s):  HGBA1C in the last 72 hours. CBG: No results for input(s): GLUCAP in the last 168 hours. Lipid Profile: No results for input(s): CHOL, HDL, LDLCALC, TRIG, CHOLHDL, LDLDIRECT in the last 72 hours. Thyroid Function Tests: No results for input(s): TSH, T4TOTAL, FREET4, T3FREE, THYROIDAB in the last 72 hours. Anemia Panel: No results for input(s): VITAMINB12, FOLATE, FERRITIN, TIBC, IRON, RETICCTPCT in the last 72 hours. Urine analysis:  Component Value Date/Time   COLORURINE YELLOW 02/16/2017 1824   APPEARANCEUR TURBID (A) 02/16/2017 1824   LABSPEC 1.012 02/16/2017 1824   PHURINE 7.0 02/16/2017 1824   GLUCOSEU NEGATIVE 02/16/2017 1824   HGBUR SMALL (A) 02/16/2017 1824   BILIRUBINUR NEGATIVE 02/16/2017 1824   KETONESUR NEGATIVE 02/16/2017 1824   PROTEINUR >=300 (A) 02/16/2017 1824   NITRITE NEGATIVE 02/16/2017 1824   LEUKOCYTESUR LARGE (A) 02/16/2017 1824   Sepsis Labs: @LABRCNTIP (procalcitonin:4,lacticidven:4) )No results found for this or any previous visit (from the past 240 hour(s)).   Radiological Exams on Admission: Dg Chest 2 View  Result Date: 03/17/2017 CLINICAL DATA:  Generalize weakness for the past 3 days. History of atrial fibrillation, diabetes, end-stage renal disease. EXAM: CHEST  2 VIEW COMPARISON:  Chest x-ray of February 19, 2017 FINDINGS: The lungs are adequately inflated and clear. The cardiac silhouette is mildly enlarged. The pulmonary vascularity is normal. The mediastinum is normal in width. The ICD is in stable position. The trachea is midline. The bony thorax exhibits no acute abnormality. There surgical clips in the left axillary region. IMPRESSION: There is no pneumonia, CHF, nor other acute cardiopulmonary abnormality. Electronically Signed   By: David  Martinique M.D.   On: 03/17/2017 13:41    EKG: (Independently reviewed) Ventricular paced rhythm with a rate of 73 bpm  Assessment/Plan Principal Problem:   Myasthenia gravis in ?? crisis  -Newly diagnosed MG  with intolerance to drug initiation (Mestinon) as evidenced by diarrhea now presenting with generalized weakness and subjective reports of difficulty swallowing without respiratory symptoms concerning for possible myasthenia crisis -Admit to stepdown -Await formal neurological consultation-may require high-dose steroids -NIF/FVC every 12 hours -Frequent neurological checks every 2 hours -NPO until seen by neurology-we'll ask RN to perform bedside swallow evaluation-holding oral medications for now -PT/OT evaluation  Active Problems:   Supratherapeutic INR -Recent difficulties with INR after initiation of Mestinon -FOB positive without gross bleeding hemoglobin has drifted downward (see below) -Hold warfarin and pharmacy to manage -Hold baby aspirin    Diabetes mellitus without complication  -SSI q 4 hrs while NPO -Hold home Amaryl while NPO -HgbA1c was 7.0 in May    Atrial fibrillation, permanent -Currently rate controlled on carvedilol -100% ventricular paced -Warfarin on hold secondary to supratherapeutic INR -CHADVASC=6    HTN (hypertension) -Currently controlled -Preadmission carvedilol on hold as above    ESRD on dialysis  -MWF and missed today -Does not appear volume overloaded based on pulmonary exam and potassium is 3.3 -Discussed with nephrology and plan dialysis tomorrow -Bone mineral/metabolic agents at discretion of nephrology    Anemia -Baseline hemoglobin 10.2 -Current hemoglobin 8.1 -FOB positive without gross bleeding noted on DRE nor by patient at home -Possible mild hemodilution contributing -Follow labs and monitor for development of bleeding    History of CVA in adulthood -Right subcortical CVA mid May of this year -Current symptoms not consistent with stroke physiology    Dyslipidemia -On Zetia and fenofibrate prior to admission      DVT prophylaxis: Warfarin  Code Status: Full Family Communication: Wife Disposition Plan: Home Consults  called: Nephrology/[place]; Neurology/[place]    Jamiria Langill L. ANP-BC [facility] Pager [phone]   If 7PM-7AM, please contact night-coverage [email or web address] Password [email or web address]  03/17/2017, 4:16 PM

## 2017-03-17 NOTE — ED Notes (Signed)
Patient transported to X-ray 

## 2017-03-17 NOTE — Progress Notes (Signed)
Patient did NIF -22 and FVC 1.5L with good effort.

## 2017-03-17 NOTE — ED Triage Notes (Signed)
Pt brought in by EMS due to having weakness that started Saturday. Pt unable to get out of bed without help. Pt a&ox4. Pt has HD on MWF.

## 2017-03-17 NOTE — ED Notes (Signed)
[facility] rep notified that pts pacemaker showed no arrhythmias or anything abnormal.

## 2017-03-17 NOTE — Progress Notes (Signed)
ANTICOAGULATION CONSULT NOTE - Initial Consult  Pharmacy Consult:  Coumadin Indication: atrial fibrillation  Allergies  Allergen Reactions  . Amlodipine Besylate Swelling    Angioedema   . Lisinopril Other (See Comments)    Renal insufficiency  . Statins Other (See Comments)    Muscle aches  . Tape Other (See Comments)    SKIN IS THIN AND TAPE WILL ACTUALLY TEAR IT; PLEASE USE COBAN WRAP INSTEAD!!    Patient Measurements: Height: 5\' 10"  (177.8 cm) IBW/kg (Calculated) : 73  Vital Signs: Temp: 99.3 F (37.4 C) (07/02 1214) Temp Source: Oral (07/02 1214) BP: 146/47 (07/02 1500) Pulse Rate: 85 (07/02 1500)  Labs:  Recent Labs  03/17/17 1257 03/17/17 1304  HGB 8.1*  --   HCT 25.5*  --   PLT 184  --   LABPROT  --  54.6*  INR  --  5.90*  CREATININE 9.23*  --     CrCl cannot be calculated (Unknown ideal weight.).   Medical History: Past Medical History:  Diagnosis Date  . Atrial fibrillation ([place])   . Chronic kidney disease, stage IV (severe) (HCC)    baseline creatinine 2-3  . DDD (degenerative disc disease)   . Diabetes mellitus without complication ([place])   . ESRD (end stage renal disease) on dialysis ([place])   . Hyperlipidemia   . Hypertension   . Melanoma ([place]) 2010   removed at [facility]  . Second degree heart block    a. s/p STJ dual chamber PPM - Dr [name]  . Stroke [facility]) 01/2017       Assessment: 60 YOM with history of MS admitted with weakness since 03/15/17.  Pharmacy consulted to manage Coumadin for history of AFib.  INR supra-therapeutic on admit; no bleeding reported.   Goal of Therapy:  INR 2-3    Plan:  - Hold Coumadin - Daily PT / INR    James David, PharmD, BCPS Pager:  [phone] 03/17/2017, 4:27 PM

## 2017-03-17 NOTE — Progress Notes (Signed)
NIF greater than 40 cmH2O 

## 2017-03-18 ENCOUNTER — Inpatient Hospital Stay (HOSPITAL_COMMUNITY): Payer: Medicare Other

## 2017-03-18 DIAGNOSIS — I5032 Chronic diastolic (congestive) heart failure: Secondary | ICD-10-CM

## 2017-03-18 DIAGNOSIS — G9340 Encephalopathy, unspecified: Secondary | ICD-10-CM

## 2017-03-18 DIAGNOSIS — J9601 Acute respiratory failure with hypoxia: Secondary | ICD-10-CM

## 2017-03-18 DIAGNOSIS — A419 Sepsis, unspecified organism: Secondary | ICD-10-CM

## 2017-03-18 DIAGNOSIS — E1121 Type 2 diabetes mellitus with diabetic nephropathy: Secondary | ICD-10-CM

## 2017-03-18 DIAGNOSIS — E785 Hyperlipidemia, unspecified: Secondary | ICD-10-CM

## 2017-03-18 DIAGNOSIS — J810 Acute pulmonary edema: Secondary | ICD-10-CM

## 2017-03-18 DIAGNOSIS — J9621 Acute and chronic respiratory failure with hypoxia: Secondary | ICD-10-CM

## 2017-03-18 DIAGNOSIS — E876 Hypokalemia: Secondary | ICD-10-CM

## 2017-03-18 DIAGNOSIS — G7001 Myasthenia gravis with (acute) exacerbation: Secondary | ICD-10-CM

## 2017-03-18 DIAGNOSIS — Z7189 Other specified counseling: Secondary | ICD-10-CM

## 2017-03-18 DIAGNOSIS — L8990 Pressure ulcer of unspecified site, unspecified stage: Secondary | ICD-10-CM | POA: Insufficient documentation

## 2017-03-18 LAB — FOLATE: Folate: 55.2 ng/mL (ref >5.9)

## 2017-03-18 LAB — CBC
HCT: 24.2 % — ABNORMAL LOW (ref 39.0–52.0)
Hemoglobin: 7.8 g/dL — ABNORMAL LOW (ref 13.0–17.0)
MCH: 30.7 pg (ref 26.0–34.0)
MCHC: 32.2 g/dL (ref 30.0–36.0)
MCV: 95.3 fL (ref 78.0–100.0)
Platelets: 198 10*3/uL (ref 150–400)
RBC: 2.54 MIL/uL — ABNORMAL LOW (ref 4.22–5.81)
RDW: 14 % (ref 11.5–15.5)
WBC: 6.5 10*3/uL (ref 4.0–10.5)

## 2017-03-18 LAB — URINALYSIS, ROUTINE W REFLEX MICROSCOPIC
Bilirubin Urine: NEGATIVE
Glucose, UA: NEGATIVE mg/dL
KETONES UR: NEGATIVE mg/dL
Nitrite: NEGATIVE
PH: 6 (ref 5.0–8.0)
PROTEIN: 100 mg/dL — AB
SQUAMOUS EPITHELIAL / LPF: NONE SEEN
Specific Gravity, Urine: 1.012 (ref 1.005–1.030)

## 2017-03-18 LAB — FERRITIN: FERRITIN: 457 ng/mL — AB (ref 24–336)

## 2017-03-18 LAB — COMPREHENSIVE METABOLIC PANEL
ALBUMIN: 2.7 g/dL — AB (ref 3.5–5.0)
ALK PHOS: 33 U/L — AB (ref 38–126)
ALT: 14 U/L — ABNORMAL LOW (ref 17–63)
ANION GAP: 11 (ref 5–15)
AST: 12 U/L — AB (ref 15–41)
BILIRUBIN TOTAL: 0.7 mg/dL (ref 0.3–1.2)
BUN: 60 mg/dL — AB (ref 6–20)
CALCIUM: 8.5 mg/dL — AB (ref 8.9–10.3)
CO2: 26 mmol/L (ref 22–32)
Chloride: 95 mmol/L — ABNORMAL LOW (ref 101–111)
Creatinine, Ser: 10.09 mg/dL — ABNORMAL HIGH (ref 0.61–1.24)
GFR calc Af Amer: 5 mL/min — ABNORMAL LOW (ref >60)
GFR calc non Af Amer: 4 mL/min — ABNORMAL LOW (ref >60)
GLUCOSE: 109 mg/dL — AB (ref 65–99)
Potassium: 2.9 mmol/L — ABNORMAL LOW (ref 3.5–5.1)
Sodium: 132 mmol/L — ABNORMAL LOW (ref 135–145)
TOTAL PROTEIN: 5.9 g/dL — AB (ref 6.5–8.1)

## 2017-03-18 LAB — IRON AND TIBC
Iron: 26 ug/dL — ABNORMAL LOW (ref 45–182)
SATURATION RATIOS: 12 % — AB (ref 17.9–39.5)
TIBC: 211 ug/dL — ABNORMAL LOW (ref 250–450)
UIBC: 185 ug/dL

## 2017-03-18 LAB — RETICULOCYTES
RBC.: 2.96 MIL/uL — AB (ref 4.22–5.81)
RETIC CT PCT: 1.9 % (ref 0.4–3.1)
Retic Count, Absolute: 56.2 10*3/uL (ref 19.0–186.0)

## 2017-03-18 LAB — BLOOD GAS, ARTERIAL
ACID-BASE EXCESS: 2.6 mmol/L — AB (ref 0.0–2.0)
Bicarbonate: 25.5 mmol/L (ref 20.0–28.0)
DRAWN BY: 301361
Delivery systems: POSITIVE
EXPIRATORY PAP: 7
FIO2: 50
Inspiratory PAP: 14
O2 SAT: 99.5 %
PH ART: 7.512 — AB (ref 7.350–7.450)
PO2 ART: 202 mmHg — AB (ref 83.0–108.0)
Patient temperature: 99.3
RATE: 10 resp/min
pCO2 arterial: 32.1 mmHg (ref 32.0–48.0)

## 2017-03-18 LAB — ABO/RH: ABO/RH(D): B POS

## 2017-03-18 LAB — POCT I-STAT 3, ART BLOOD GAS (G3+)
ACID-BASE EXCESS: 3 mmol/L — AB (ref 0.0–2.0)
Bicarbonate: 26.7 mmol/L (ref 20.0–28.0)
O2 Saturation: 100 %
Patient temperature: 99.4
TCO2: 28 mmol/L (ref 0–100)
pCO2 arterial: 36.8 mmHg (ref 32.0–48.0)
pH, Arterial: 7.471 — ABNORMAL HIGH (ref 7.350–7.450)
pO2, Arterial: 197 mmHg — ABNORMAL HIGH (ref 83.0–108.0)

## 2017-03-18 LAB — LACTATE DEHYDROGENASE: LDH: 144 U/L (ref 98–192)

## 2017-03-18 LAB — GLUCOSE, CAPILLARY
GLUCOSE-CAPILLARY: 107 mg/dL — AB (ref 65–99)
GLUCOSE-CAPILLARY: 120 mg/dL — AB (ref 65–99)
GLUCOSE-CAPILLARY: 101 mg/dL — AB (ref 65–99)
Glucose-Capillary: 75 mg/dL (ref 65–99)
Glucose-Capillary: 68 mg/dL (ref 65–99)
Glucose-Capillary: 147 mg/dL — ABNORMAL HIGH (ref 65–99)

## 2017-03-18 LAB — PROTIME-INR
INR: 6.78
INR: 2.64
PROTHROMBIN TIME: 28.7 s — AB (ref 11.4–15.2)
Prothrombin Time: 61.1 seconds — ABNORMAL HIGH (ref 11.4–15.2)

## 2017-03-18 LAB — VITAMIN B12: VITAMIN B 12: 414 pg/mL (ref 180–914)

## 2017-03-18 LAB — PREPARE RBC (CROSSMATCH)

## 2017-03-18 LAB — LACTIC ACID, PLASMA
LACTIC ACID, VENOUS: 0.6 mmol/L (ref 0.5–1.9)
Lactic Acid, Venous: 1.1 mmol/L (ref 0.5–1.9)

## 2017-03-18 MED ORDER — METOPROLOL TARTRATE 5 MG/5ML IV SOLN
2.5000 mg | Freq: Three times a day (TID) | INTRAVENOUS | Status: DC
  Administered 2017-03-18 – 2017-03-21 (×7): 2.5 mg via INTRAVENOUS
  Filled 2017-03-18 (×7): qty 5

## 2017-03-18 MED ORDER — IPRATROPIUM-ALBUTEROL 0.5-2.5 (3) MG/3ML IN SOLN
RESPIRATORY_TRACT | Status: AC
  Filled 2017-03-18: qty 3

## 2017-03-18 MED ORDER — POTASSIUM CHLORIDE CRYS ER 20 MEQ PO TBCR
40.0000 meq | EXTENDED_RELEASE_TABLET | Freq: Once | ORAL | Status: DC
  Filled 2017-03-18: qty 4

## 2017-03-18 MED ORDER — ORAL CARE MOUTH RINSE
15.0000 mL | Freq: Two times a day (BID) | OROMUCOSAL | Status: DC
  Administered 2017-03-18: 15 mL via OROMUCOSAL

## 2017-03-18 MED ORDER — FENTANYL CITRATE (PF) 100 MCG/2ML IJ SOLN
50.0000 ug | INTRAMUSCULAR | Status: DC | PRN
  Administered 2017-03-18: 50 ug via INTRAVENOUS

## 2017-03-18 MED ORDER — PIPERACILLIN-TAZOBACTAM 3.375 G IVPB
3.3750 g | Freq: Two times a day (BID) | INTRAVENOUS | Status: DC
  Administered 2017-03-18 – 2017-03-21 (×6): 3.375 g via INTRAVENOUS
  Filled 2017-03-18 (×7): qty 50

## 2017-03-18 MED ORDER — SODIUM CHLORIDE 0.9 % IV SOLN
125.0000 mg | Freq: Once | INTRAVENOUS | Status: DC
  Filled 2017-03-18: qty 10

## 2017-03-18 MED ORDER — FUROSEMIDE 10 MG/ML IJ SOLN
INTRAMUSCULAR | Status: AC
  Filled 2017-03-18: qty 8

## 2017-03-18 MED ORDER — MIDAZOLAM HCL 2 MG/2ML IJ SOLN
1.0000 mg | INTRAMUSCULAR | Status: DC | PRN
  Administered 2017-03-18: 1 mg via INTRAVENOUS
  Filled 2017-03-18: qty 2

## 2017-03-18 MED ORDER — SODIUM CHLORIDE 0.9 % IV SOLN: 125.0000 mg | INTRAVENOUS | Status: DC

## 2017-03-18 MED ORDER — SUCCINYLCHOLINE CHLORIDE 20 MG/ML IJ SOLN
100.0000 mg | Freq: Once | INTRAMUSCULAR | Status: AC
  Administered 2017-03-18: 100 mg via INTRAVENOUS

## 2017-03-18 MED ORDER — MIDAZOLAM HCL 2 MG/2ML IJ SOLN
1.0000 mg | INTRAMUSCULAR | Status: DC | PRN
  Administered 2017-03-18 – 2017-03-19 (×7): 1 mg via INTRAVENOUS
  Filled 2017-03-18 (×7): qty 2

## 2017-03-18 MED ORDER — CHLORHEXIDINE GLUCONATE 0.12% ORAL RINSE (MEDLINE KIT)
15.0000 mL | Freq: Two times a day (BID) | OROMUCOSAL | Status: DC
  Administered 2017-03-18 – 2017-03-24 (×11): 15 mL via OROMUCOSAL

## 2017-03-18 MED ORDER — FENTANYL CITRATE (PF) 100 MCG/2ML IJ SOLN
INTRAMUSCULAR | Status: AC
Start: 2017-03-18 — End: 2017-03-19
  Filled 2017-03-18: qty 2

## 2017-03-18 MED ORDER — ETOMIDATE 2 MG/ML IV SOLN
0.3000 mg/kg | Freq: Once | INTRAVENOUS | Status: AC
  Administered 2017-03-18: 30 mg via INTRAVENOUS

## 2017-03-18 MED ORDER — FENTANYL CITRATE (PF) 100 MCG/2ML IJ SOLN: 50.0000 ug | INTRAMUSCULAR | Status: DC | PRN

## 2017-03-18 MED ORDER — SODIUM CHLORIDE 0.9 % IV SOLN: 100.0000 mL | INTRAVENOUS | Status: DC | PRN

## 2017-03-18 MED ORDER — METHYLPREDNISOLONE SODIUM SUCC 125 MG IJ SOLR
80.0000 mg | INTRAMUSCULAR | Status: DC
  Administered 2017-03-18 – 2017-03-20 (×3): 80 mg via INTRAVENOUS
  Filled 2017-03-18 (×3): qty 2

## 2017-03-18 MED ORDER — LIDOCAINE-PRILOCAINE 2.5-2.5 % EX CREA
1.0000 "application " | TOPICAL_CREAM | CUTANEOUS | Status: DC | PRN
  Filled 2017-03-18: qty 5

## 2017-03-18 MED ORDER — DARBEPOETIN ALFA 100 MCG/0.5ML IJ SOSY
100.0000 ug | PREFILLED_SYRINGE | Freq: Once | INTRAMUSCULAR | Status: AC
  Administered 2017-03-18: 100 ug via INTRAVENOUS
  Filled 2017-03-18 (×3): qty 0.5

## 2017-03-18 MED ORDER — VITAMIN K1 10 MG/ML IJ SOLN
5.0000 mg | Freq: Once | INTRAVENOUS | Status: DC
  Filled 2017-03-18: qty 0.5

## 2017-03-18 MED ORDER — MIDAZOLAM HCL 2 MG/2ML IJ SOLN
2.0000 mg | Freq: Once | INTRAMUSCULAR | Status: AC
  Administered 2017-03-18: 2 mg via INTRAVENOUS
  Filled 2017-03-18: qty 2

## 2017-03-18 MED ORDER — FENTANYL CITRATE (PF) 100 MCG/2ML IJ SOLN
50.0000 ug | INTRAMUSCULAR | Status: DC | PRN
  Administered 2017-03-18: 50 ug via INTRAVENOUS
  Filled 2017-03-18: qty 2

## 2017-03-18 MED ORDER — VANCOMYCIN HCL 1000 MG IV SOLR
1750.0000 mg | INTRAVENOUS | Status: AC
  Administered 2017-03-18: 1750 mg via INTRAVENOUS
  Filled 2017-03-18: qty 1750

## 2017-03-18 MED ORDER — IPRATROPIUM-ALBUTEROL 0.5-2.5 (3) MG/3ML IN SOLN
3.0000 mL | Freq: Four times a day (QID) | RESPIRATORY_TRACT | Status: DC
  Administered 2017-03-18 (×2): 3 mL via RESPIRATORY_TRACT
  Administered 2017-03-18: 11:00:00 via RESPIRATORY_TRACT
  Administered 2017-03-19 – 2017-03-21 (×9): 3 mL via RESPIRATORY_TRACT
  Filled 2017-03-18 (×11): qty 3

## 2017-03-18 MED ORDER — FUROSEMIDE 10 MG/ML IJ SOLN
80.0000 mg | Freq: Once | INTRAMUSCULAR | Status: AC
  Administered 2017-03-18: 80 mg via INTRAVENOUS

## 2017-03-18 MED ORDER — VANCOMYCIN HCL IN DEXTROSE 750-5 MG/150ML-% IV SOLN
750.0000 mg | INTRAVENOUS | Status: DC
  Filled 2017-03-18: qty 150

## 2017-03-18 MED ORDER — POTASSIUM CHLORIDE 20 MEQ PO PACK
40.0000 meq | PACK | Freq: Once | ORAL | Status: AC
  Administered 2017-03-18: 40 meq via ORAL
  Filled 2017-03-18: qty 2

## 2017-03-18 MED ORDER — LIDOCAINE HCL (PF) 1 % IJ SOLN: 5.0000 mL | INTRAMUSCULAR | Status: DC | PRN

## 2017-03-18 MED ORDER — SODIUM CHLORIDE 0.9 % IV SOLN
Freq: Once | INTRAVENOUS | Status: AC
Start: 2017-03-18 — End: 2017-03-19
  Administered 2017-03-18: 10:00:00 via INTRAVENOUS

## 2017-03-18 MED ORDER — PENTAFLUOROPROP-TETRAFLUOROETH EX AERO: 1.0000 "application " | INHALATION_SPRAY | CUTANEOUS | Status: DC | PRN

## 2017-03-18 MED ORDER — ORAL CARE MOUTH RINSE
15.0000 mL | Freq: Four times a day (QID) | OROMUCOSAL | Status: DC
  Administered 2017-03-18 – 2017-03-23 (×15): 15 mL via OROMUCOSAL

## 2017-03-18 MED ORDER — HEPARIN SODIUM (PORCINE) 1000 UNIT/ML DIALYSIS: 1000.0000 [IU] | INTRAMUSCULAR | Status: DC | PRN

## 2017-03-18 MED ORDER — SUCCINYLCHOLINE CHLORIDE 20 MG/ML IJ SOLN
200.0000 mg | Freq: Once | INTRAMUSCULAR | Status: AC
  Administered 2017-03-18: 100 mg via INTRAVENOUS

## 2017-03-18 MED ORDER — SODIUM CHLORIDE 0.9 % IV BOLUS (SEPSIS)
500.0000 mL | Freq: Once | INTRAVENOUS | Status: AC
  Administered 2017-03-18: 500 mL via INTRAVENOUS

## 2017-03-18 MED ORDER — ONDANSETRON HCL 4 MG/2ML IJ SOLN
4.0000 mg | Freq: Four times a day (QID) | INTRAMUSCULAR | Status: DC | PRN
  Administered 2017-03-18: 4 mg via INTRAVENOUS

## 2017-03-18 MED ORDER — DIPHENHYDRAMINE HCL 50 MG/ML IJ SOLN
50.0000 mg | Freq: Every day | INTRAMUSCULAR | Status: AC
  Administered 2017-03-18 – 2017-03-21 (×4): 50 mg via INTRAVENOUS
  Filled 2017-03-18 (×5): qty 1

## 2017-03-18 MED ORDER — MIDAZOLAM HCL 2 MG/2ML IJ SOLN
INTRAMUSCULAR | Status: AC
  Filled 2017-03-18: qty 2

## 2017-03-18 MED ORDER — ALTEPLASE 2 MG IJ SOLR: 2.0000 mg | Freq: Once | INTRAMUSCULAR | Status: DC | PRN

## 2017-03-18 MED ORDER — IOPAMIDOL (ISOVUE-300) INJECTION 61%
INTRAVENOUS | Status: AC
  Administered 2017-03-18: 100 mL
  Filled 2017-03-18: qty 100

## 2017-03-18 MED ORDER — CARVEDILOL 3.125 MG PO TABS: 3.1250 mg | ORAL_TABLET | Freq: Two times a day (BID) | ORAL | Status: DC

## 2017-03-18 MED ORDER — MIDAZOLAM HCL 2 MG/2ML IJ SOLN: 1.0000 mg | INTRAMUSCULAR | Status: DC | PRN

## 2017-03-18 MED ORDER — DOXERCALCIFEROL 4 MCG/2ML IV SOLN
4.0000 ug | INTRAVENOUS | Status: DC
  Administered 2017-03-19 – 2017-03-26 (×4): 4 ug via INTRAVENOUS
  Filled 2017-03-18 (×5): qty 2

## 2017-03-18 MED ORDER — VITAMIN K1 10 MG/ML IJ SOLN
1.0000 mg | Freq: Once | INTRAVENOUS | Status: AC
  Administered 2017-03-18: 1 mg via INTRAVENOUS
  Filled 2017-03-18: qty 0.1

## 2017-03-18 MED ORDER — FENTANYL 2500MCG IN NS 250ML (10MCG/ML) PREMIX INFUSION
25.0000 ug/h | INTRAVENOUS | Status: DC
  Administered 2017-03-18: 25 ug/h via INTRAVENOUS
  Administered 2017-03-19: 100 ug/h via INTRAVENOUS
  Filled 2017-03-18 (×2): qty 250

## 2017-03-18 MED ORDER — MIDAZOLAM HCL 2 MG/2ML IJ SOLN
1.0000 mg | Freq: Once | INTRAMUSCULAR | Status: AC
  Administered 2017-03-18: 1 mg via INTRAVENOUS

## 2017-03-18 NOTE — Consult Note (Signed)
[facility] Renal Consultation Note    Indication for Consultation:  Management of ESRD/hemodialysis; anemia, hypertension/volume and secondary hyperparathyroidism Referring provider:  Dia Crawford, MD  HPI: James David is a 81 y.o. male with ESRD on Hd since Jan 18, 2017 with complex PMHx and several recent hospitalizations.  Post recently he was discharged from rehab 6/20 following treatment for a CVA and UTI.  Medical history is also significant for  HTN, afib, right should melanoma, , cervical disc disease, 2nd degree heart block s/p PPM  And DM.  He also has had a number of falls lately noted during the month of June stating his legs were giving out and he had no associated dizziness per notes from the dialysis center on 6/18 and 6/25.  He has been on Mestinon for newly diagnosed myasthenia gravis which has caused diarrhea.  He presented to the ED 7/2 with progressive weakness for the past 3-4 days.  Per admission H and P he was too weak to attend HD, but denies SOB, dysphagia.  He had a low grade temp on admission with elevated INR of 5.9.  Stool was heme + without any gross blood noted on exam.  He was admitted for further evaluation and treatment. hgb was 8.1 - down from 8.9 6/25.  tsat 12% WBC 10.2 K 3.3 Ca 8.9 alb 2.2 P normal LFTs ok Trop 0 .0 EKG showed paced rhythm. CXR 7/2 NAD.  Plans were to dialyze him in the dialysis unit today but he had acute respiratory distress with fever spike to 104.9 rectal and was transported to 4 N for separate HD.  Sat s dropped to 87% but are now up to 100% with BiPAP. He is now breathing better but very drowsy.  I saw him in the midst of HD- on bipap but no SOB per him- shivering due to fever and low temp on HD- min UF due to low BP  Past Medical History:  Diagnosis Date  . Atrial fibrillation ([place])   . Chronic kidney disease, stage IV (severe) (HCC)    baseline creatinine 2-3  . DDD (degenerative disc disease)   . Diabetes mellitus  without complication ([place])   . ESRD (end stage renal disease) on dialysis ([place])   . Hyperlipidemia   . Hypertension   . Melanoma ([place]) 2010   removed at [facility]  . Second degree heart block    a. s/p STJ dual chamber PPM - Dr [name]  . Stroke [facility]) 01/2017   Past Surgical History:  Procedure Laterality Date  . AV FISTULA PLACEMENT Right 05/31/2014   Procedure: Right Arm Brachiocephalic ARTERIOVENOUS FISTULA CREATION  ;  Surgeon: Conrad Savannah, MD;  Location: [place];  Service: Vascular;  Laterality: Right;  . CATARACT EXTRACTION W/ INTRAOCULAR LENS  IMPLANT, BILATERAL    . COLONOSCOPY    . EXCISION MELANOMA WITH SENTINEL LYMPH NODE BIOPSY Right 02/08/2016   Procedure: WIDE EXCISION RIGHT SHOULDER MELANOMA WITH RIGHT SENTINEL LYMPH NODE BIOPSY;  Surgeon: [name], MD;  Location: [place];  Service: General;  Laterality: Right;  . LAMINECTOMY    . PERMANENT PACEMAKER INSERTION N/A 01/20/2014   STJ Assurity dual chamber pacemaker implanted by Dr [name] for 2nd degree AV block   Family History  Problem Relation Age of Onset  . Pulmonary embolism Mother   . Diabetes Mother   . Heart attack Father   . Diabetes Brother    Social History:  reports that he has never smoked. He  has never used smokeless tobacco. He reports that he does not drink alcohol or use drugs. Allergies  Allergen Reactions  . Amlodipine Besylate Swelling    Angioedema   . Lisinopril Other (See Comments)    Renal insufficiency  . Statins Other (See Comments)    Muscle aches  . Ropinirole Other (See Comments)    Causes excessive grogginess the following morning after taking  . Tape Other (See Comments)    SKIN IS THIN AND TAPE WILL ACTUALLY TEAR IT; PLEASE USE COBAN WRAP INSTEAD!!   Prior to Admission medications   Medication Sig Start Date End Date Taking? Authorizing Provider  acetaminophen (TYLENOL) 500 MG tablet Take 500 mg by mouth every 8 (eight) hours as needed for mild pain. (pain/headaches)   Yes [provider]  aspirin EC 81 MG tablet Take 81 mg by mouth at bedtime.   Yes [provider]  calcium acetate (PHOSLO) 667 MG capsule Take 2 capsules (1,334 mg total) by mouth 3 (three) times daily. Patient taking differently: Take 667-1,334 mg by mouth See admin instructions. 1,334 mg three times a day after meals and 667 mg with each snack 02/14/17  Yes Angiulli, Lavon Paganini, PA-C  carvedilol (COREG) 3.125 MG tablet Take 1 tablet (3.125 mg total) by mouth 2 (two) times daily with a meal. 02/14/17  Yes Angiulli, Lavon Paganini, PA-C  ezetimibe (ZETIA) 10 MG tablet Take 1 tablet (10 mg total) by mouth daily. 02/14/17  Yes Angiulli, Lavon Paganini, PA-C  fenofibrate 160 MG tablet Take 1 tablet (160 mg total) by mouth daily. 02/14/17  Yes Angiulli, Lavon Paganini, PA-C  finasteride (PROSCAR) 5 MG tablet Take 1 tablet (5 mg total) by mouth daily. 02/14/17  Yes Angiulli, Lavon Paganini, PA-C  gabapentin (NEURONTIN) 100 MG capsule Take 1 capsule (100 mg total) by mouth at bedtime. 02/14/17  Yes Angiulli, Lavon Paganini, PA-C  glimepiride (AMARYL) 1 MG tablet Take 1 tablet (1 mg total) by mouth daily with breakfast. 02/14/17  Yes Angiulli, Lavon Paganini, PA-C  Hypromellose (ARTIFICIAL TEARS OP) Place 1 drop into both eyes 3 (three) times daily as needed (for dry eyes).    Yes [provider]  ipratropium (ATROVENT) 0.03 % nasal spray Place 2 sprays into both nostrils daily as needed for rhinitis.    Yes [provider]  Multiple Vitamin (DAILY-VITE PO) Take 1 tablet by mouth at bedtime. WITH ZINC   Yes [provider]  pyridostigmine (MESTINON) 60 MG tablet Take 1/2 to 1 pill po tid Patient taking differently: Take 30 mg by mouth 2 (two) times daily. BREAKFAST AND SUPPERTIME 03/03/17  Yes Sater, Nanine Means, MD  warfarin (COUMADIN) 1 MG tablet 1 1/2 tab daily Patient taking differently: Take 1-1.5 mg by mouth See admin instructions. 1.5 mg with supper on Sun/Tues/Thurs/Sat and 1 mg on Mon/Wed/Fri 02/14/17  Yes Angiulli,  Lavon Paganini, PA-C  multivitamin (RENA-VIT) TABS tablet Take 1 tablet by mouth at bedtime. Patient not taking: Reported on 03/17/2017 02/14/17   Angiulli, Lavon Paganini, PA-C  rOPINIRole (REQUIP) 0.5 MG tablet Take 1 tablet (0.5 mg total) by mouth at bedtime. Patient not taking: Reported on 03/17/2017 02/14/17   Angiulli, Lavon Paganini, PA-C  senna-docusate (SENOKOT-S) 8.6-50 MG tablet Take 2 tablets by mouth at bedtime. Patient not taking: Reported on 03/04/2017 02/14/17   Cathlyn Parsons, PA-C   Current Facility-Administered Medications  Medication Dose Route Frequency Provider Last Rate Last Dose  . acetaminophen (TYLENOL) tablet 650 mg  650 mg Oral  Q6H PRN Samella Parr, NP       Or  . acetaminophen (TYLENOL) suppository 650 mg  650 mg Rectal Q6H PRN Samella Parr, NP   650 mg at 03/18/17 1154  . carvedilol (COREG) tablet 3.125 mg  3.125 mg Oral BID WC Allie Bossier, MD      . Darbepoetin Alfa (ARANESP) injection 100 mcg  100 mcg Intravenous Once in dialysis Alric Seton, PA-C      . [START ON 03/19/2017] doxercalciferol (HECTOROL) injection 4 mcg  4 mcg Intravenous Q M,W,F-HD Alric Seton, PA-C      . Immune Globulin 10% (PRIVIGEN) IV infusion 30 g  400 mg/kg Intravenous Q24 Hr x 5 Greta Doom, MD   Stopped at 03/17/17 2230  . insulin aspart (novoLOG) injection 0-5 Units  0-5 Units Subcutaneous QHS Rama, Christina P, MD      . insulin aspart (novoLOG) injection 0-9 Units  0-9 Units Subcutaneous TID WC Rama, Christina P, MD      . ipratropium-albuterol (DUONEB) 0.5-2.5 (3) MG/3ML nebulizer solution 3 mL  3 mL Nebulization Q6H Allie Bossier, MD      . methylPREDNISolone sodium succinate (SOLU-MEDROL) 125 mg/2 mL injection 80 mg  80 mg Intravenous Q24H Allie Bossier, MD      . ondansetron [facility]) injection 4 mg  4 mg Intravenous Q6H PRN Rush Farmer, MD   4 mg at 03/18/17 1130  . ondansetron (ZOFRAN) tablet 4 mg  4 mg Oral Q6H PRN Samella Parr, NP      . phytonadione (VITAMIN K)  5 mg in dextrose 5 % 50 mL IVPB  5 mg Intravenous Once Allie Bossier, MD      . sodium chloride flush (NS) 0.9 % injection 3 mL  3 mL Intravenous Q12H Samella Parr, NP       Labs: Basic Metabolic Panel:  Recent Labs Lab 03/17/17 1257 03/17/17 1817 03/18/17 0245  NA 133*  --  132*  K 3.3*  --  2.9*  CL 93*  --  95*  CO2 28  --  26  GLUCOSE 147*  --  109*  BUN 54*  --  60*  CREATININE 9.23*  --  10.09*  CALCIUM 8.9  --  8.5*  PHOS  --  3.5  --    Liver Function Tests:  Recent Labs Lab 03/17/17 1257 03/18/17 0245  AST 17 12*  ALT 15* 14*  ALKPHOS 34* 33*  BILITOT 0.7 0.7  PROT 5.5* 5.9*  ALBUMIN 3.0* 2.7*   CBC:  Recent Labs Lab 03/17/17 1257 03/18/17 0245  WBC 10.2 6.5  NEUTROABS 8.8*  --   HGB 8.1* 7.8*  HCT 25.5* 24.2*  MCV 96.2 95.3  PLT 184 198   CBG:  Recent Labs Lab 03/17/17 2104 03/18/17 0740 03/18/17 0908 03/18/17 1012 03/18/17 1127  GLUCAP 159* 75 68 107* 120*   Iron Studies:  Recent Labs  03/18/17 1017  IRON 26*  TIBC 211*  FERRITIN 457*   Studies/Results: Dg Chest 2 View  Result Date: 03/17/2017 CLINICAL DATA:  Generalize weakness for the past 3 days. History of atrial fibrillation, diabetes, end-stage renal disease. EXAM: CHEST  2 VIEW COMPARISON:  Chest x-ray of February 19, 2017 FINDINGS: The lungs are adequately inflated and clear. The cardiac silhouette is mildly enlarged. The pulmonary vascularity is normal. The mediastinum is normal in width. The ICD is in stable position. The trachea is midline. The bony thorax exhibits no  acute abnormality. There surgical clips in the left axillary region. IMPRESSION: There is no pneumonia, CHF, nor other acute cardiopulmonary abnormality. Electronically Signed   By: David  Martinique M.D.   On: 03/17/2017 13:41    ROS: As per HPI otherwise negative.  Physical Exam: Vitals:   03/18/17 1052 03/18/17 1112 03/18/17 1124 03/18/17 1201  BP: (!) 165/57 (!) 165/57 (!) 115/26   Pulse: (!) 109 (!) 101  100 96  Resp: (!) 48 (!) 45 (!) 42 (!) 35  Temp:  (!) 104.9 F (40.5 C)    TempSrc:  Rectal    SpO2: 93% 100% 100% 100%  Weight:      Height:         General: chronically ill WM Head: NCAT sclera not icteric BiPAP in place Neck: neck veins full Lungs:  Breathing better with BiPAP - dim BS throughout Heart:  tachy  Abdomen: soft NT + BS M-S: muscle wasting  Lower extremities:without LE edema  Neuro: awake, drowsy Dialysis Access: right upper AVF + bruit Skin: hot to touch  Dialysis Orders: [facility] MWF - EDW 80 - getting slightly below at times 2 K 2.25 Ca 400/800 right upper AVF no heparin hectorol 4 venofer 50 6/25 mircera 75 6/20 - just ^ to 100 for next dose Recent labs: hgb 8.9 INR ok 25% sat ferritin 135 May ipTH 291  Assessment/Plan: 1. Acute respiratory distress - clear CXR yesterday - goal 2 - 2.5 L today - assess again with HD tomorrow - on BiPAP.  Not sure volume is a big part of this- maybe due to fever/myesthenia- HD today with as much UF as he will allow 2. ESRD -  MWF via AVF - last HD Friday - HD today and again tomrrow back on schedule- dialyzing as a separate - pt transferred to 4 N 3. Hypertension/volume  - BP variable - sats ok - titrate volume as BP allows - low dose coreg 4. Anemia  - Fe def anemia heme + dose ESA today - Aranesp 100 - hold on Fe for now given fever; transfuse prn hgb 7.8 today - have cancelled transfusion for today - can give tomorrow if necessary- could be contributing to his SOB 5. Metabolic bone disease -  resume hectorol tomorrow 6. Nutrition - heart healthy diet ok - given low temp 7. Supra therapeutic INR - no heparin HD - vit K given 8. Myasthenia gravis - IVIG per neuro 9. Acute Febrile illness - cultures pending - CXR ordered for 7/4 - no abtx ordered yet ? Reaction to IVIG 10. DM - per primary  Myriam Jacobson, PA-C [place] [phone] 03/18/2017, 12:34 PM   Patient seen and examined, agree with above note with  above modifications. Fairly new ESRD known to [place].  Has had a very rocky course since starting HD most recently stroke and now diagnosis of myesthnia gravis.  Now presents with weakness but then big fever spike and hypoxia- work up for sepsis.  We will provide HD today, then tomorrow to get back on schedule- I do not think is very volume overloaded  [name], MD 03/18/2017

## 2017-03-18 NOTE — Progress Notes (Signed)
Patient has had worsening generalized weakness as well as a fever this afternoon. He has been moved to the ICU and is currently on BiPAP. He is pulling good volumes, but I am concerned that he maintain be having some issues clearing his secretions as well as a cause for his fever, though this is still not clear on exam. His lower extremities continued to be quite brisk, and he appears to have some  decreased rectal tone. but he is able to move both lower extremities voluntarily. I would favor repeat imaging of his cervical and thoracic spine with contrast at this time.  If his respiratory status is slowly due to myasthenia, especially with the degree of worsening we have seen over the course of the day, I would be quite concerned that he may need elective intubation. Also,  I would favor pre-medicating for the next dose of IVIG. He received solu-Medrol earlier today, and I will put in for Benadryl prior to the infusion.  Roland Rack, MD [facility] [phone]  If 7pm- 7am, please page neurology on call as listed in [place].

## 2017-03-18 NOTE — Procedures (Signed)
Central Venous Catheter Insertion Procedure Note James David [id number] [birth year]  Procedure: Insertion of Central Venous Catheter Indications: Assessment of intravascular volume, Drug and/or fluid administration and Frequent blood sampling  Procedure Details Consent: Unable to obtain consent because of emergent medical necessity. Time Out: Verified patient identification, verified procedure, site/side was marked, verified correct patient position, special equipment/implants available, medications/allergies/relevent history reviewed, required imaging and test results available.  Performed  Maximum sterile technique was used including antiseptics, cap, gloves, gown, hand hygiene, mask and sheet. Skin prep: Chlorhexidine; local anesthetic administered- 4 ml of 1% lidocaine.  A antimicrobial bonded/coated triple lumen catheter was placed in the right internal jugular vein using the Seldinger technique to 17 cm.  Biopatch applied and line sutured.  Sterile dressing applied.     Evaluation Blood flow good Complications: No apparent complications Patient did tolerate procedure well. Chest X-ray ordered to verify placement.  CXR: pending.  Procedure performed under ultrasound guidance for real time vessel cannulation.     Kennieth Rad, AGACNP-BC [facility] Pgr: [phone] or if no answer [phone] 03/18/2017, 9:55 PM

## 2017-03-18 NOTE — Progress Notes (Signed)
Pt found in respiratory distress with O2 sat at 87%, nasal cannula was placed and then a non-rebreather @15  L,; O2 sat then improved to 96%. Rapid and Dr Sherral Hammers was called as pt had audible wheezing & distress. Dr Sherral Hammers placed order to transfer pt to ICU.

## 2017-03-18 NOTE — Progress Notes (Signed)
ANTICOAGULATION CONSULT NOTE - Initial Consult  Pharmacy Consult:  Warfarin Indication: atrial fibrillation  Allergies  Allergen Reactions  . Amlodipine Besylate Swelling    Angioedema   . Lisinopril Other (See Comments)    Renal insufficiency  . Statins Other (See Comments)    Muscle aches  . Ropinirole Other (See Comments)    Causes excessive grogginess the following morning after taking  . Tape Other (See Comments)    SKIN IS THIN AND TAPE WILL ACTUALLY TEAR IT; PLEASE USE COBAN WRAP INSTEAD!!    Patient Measurements: Height: 5\' 10"  (177.8 cm) Weight: 175 lb 0.7 oz (79.4 kg) IBW/kg (Calculated) : 73  Vital Signs: Temp: 98.9 F (37.2 C) (07/03 0336) Temp Source: Oral (07/03 0336) BP: 137/48 (07/03 0700) Pulse Rate: 80 (07/03 0336)  Labs:  Recent Labs  03/17/17 1257 03/17/17 1304 03/18/17 0245  HGB 8.1*  --  7.8*  HCT 25.5*  --  24.2*  PLT 184  --  198  LABPROT  --  54.6* 61.1*  INR  --  5.90* 6.78*  CREATININE 9.23*  --  10.09*    Estimated Creatinine Clearance: 6.2 mL/min (A) (by C-G formula based on SCr of 10.09 mg/dL (H)).   Medical History: Past Medical History:  Diagnosis Date  . Atrial fibrillation ([place])   . Chronic kidney disease, stage IV (severe) (HCC)    baseline creatinine 2-3  . DDD (degenerative disc disease)   . Diabetes mellitus without complication ([place])   . ESRD (end stage renal disease) on dialysis ([place])   . Hyperlipidemia   . Hypertension   . Melanoma ([place]) 2010   removed at [facility]  . Second degree heart block    a. s/p STJ dual chamber PPM - Dr [name]  . Stroke [facility]) 01/2017       Assessment:  50 YOM with history of myasthenia gravis admitted with progressive weakness since 03/15/17.  Pharmacy consulted to manage warfarin for history of AFib. CHADS2VA2Sc=6.   INR supra-therapeutic on admit, higher today to 6.78. Vitamin K 1 mg IV x1 given 7/3. No overt bleeding reported however FOBT+. CBC low, relatively stable.  Diet  started 7/2, poor PO intake. No new DDI noted.    Goal of Therapy:  INR 2-3  Plan:  - Continue to hold warfarin - Daily INR, CBC - Monitor for s/sx bleeding, PO intake, DDI, clinical picture - Follow up [place], Pharm.D. 7/3/20189:04 AM

## 2017-03-18 NOTE — Progress Notes (Signed)
Subjective: [name] was resting comfortably in bed on my visit. In NAD. Breathing is unlabored, no SOB, swallowing better than yesterday, eyes open without trouble, denies diplopia and blurry vision. He feels stronger subjectively but won't know "for sure" until he gets up to walk. States that his legs still "jump" occasionally. Per patient, chronic tremor in the upper extremities is unchanged from baseline.  Current Pertinent Medications: IVIG 2g/kg daily started 7/2   Physical Examination: Vitals:   03/18/17 0336 03/18/17 0700  BP: (!) 145/66 (!) 137/48  Pulse: 80   Resp: (!) 25   Temp: 98.9 F (37.2 C)     General: WDWN male.  HEENT:  Normocephalic, no lesions, without obvious abnormality. Unkept. Normal external eye and conjunctiva.  Normal external ears. Normal external nose, mucus membranes and septum.  Normal pharynx. Cardiovascular: regular rate and rhythm, tachycardic, pulses palpable throughout   Pulmonary: chest clear, no wheezing, rales, normal symmetric air entry Abdomen: soft, non-tender Extremities: no joint deformities, effusion, or inflammation Musculoskeletal: no joint tenderness, deformity or swelling Skin: warm and dry, no hyperpigmentation, vitiligo, or suspicious lesions  Neurological Examination:  CN: Pupils are equal and round. They are symmetrically and minimally reactive from 2-->1 mm. EOMI without nystagmus. Facial sensation is intact to light touch. Face is symmetric at rest with normal strength and mobility. Transverse smile. Hearing is intact to loud conversational voice. Palate elevates symmetrically and uvula is midline. Voice is normal in tone, pitch and quality. Bilateral SCM and trapezii are 5/5. Tongue is midline with normal bulk and mobility.  Motor: Normal bulk, tone, and strength. 5/5 throughout. No drift.  Sensation: Intact to light touch.  DTRs: 2+, symmetric BUE. 3+ in patellar reflexes bilaterally. 4+ AJ with 4-5 beat clonus  bilaterally.  Coordination: Finger-to-nose is without dysmetria    Impression and plan per attending neurologist  Solon [place] PA-C [facility]  03/18/2017, 8:28 AM  I've seen the patient reviewed the above meds. He continues to be hyperreflexic in the lower extremities, he has a coarse postural and intentional tremor that is improved with making tests. No diplopia or ptosis.   Impression: 81 year old male with subjective progressive generalized weakness in the setting of recent diagnosis of myasthenia gravis. His hyperreflexia could be a sequela of his previous cervical injury, though current myelopathy would also be a consideration. His dysphagia would argue more for myasthenia as a cause of his current symptoms, but if he does not improve with IVIG then I think a myelogram would be needed. Unfortunately MRI is not possible because of his pacemaker.  Recommendations: 1) continue IVIG 2) I'll plan to start steroids on day 3 of IVIG 3) infectious evaluation per internal medicine looking for reasons for exacerbation 4) neurology will continue to follow.  Roland Rack, MD [facility] [phone]  If 7pm- 7am, please page neurology on call as listed in [place].

## 2017-03-18 NOTE — Progress Notes (Signed)
PT Cancellation Note  Patient Details Name: James David MRN: [id number] DOB: 01/12/1936   Cancelled Treatment:    Reason Eval/Treat Not Completed: Medical issues which prohibited therapy.  Elevating and high INR's and will hold for now given protocol for tx with rehab.  Check back as pt is able to tolerate therapy.   Ramond Dial 03/18/2017, 10:11 AM   Mee Hives, PT MS Acute Rehab Dept. Number: [place] and [place]

## 2017-03-18 NOTE — Progress Notes (Signed)
PROGRESS NOTE    James David  GYI:[id number] DOB: 04/03/1936 DOA: 03/17/2017 PCP: [name], MD   Brief Narrative:  81 y.o. male PMHx Melanoma,DM Type 2 on oral agents, ESRD on HD, Atrial fibrillation on chronic warfarin, Anemia of CKD, Dyslipidemia. Recent admission for right subcortical stroke mid May 2018. Patient completed therapy in CIR and was discharged home at that time he was able to mobilize with the assistance of a rolling walker. He has also followed up with neurology since that admission and due to persistent weakness not explained by patient's recent stroke other neurological causes were investigated. He has subsequently been diagnosed as a mid June with myasthenia gravis. He initially started taking low-dose Mestinon 15 mg 3 times a day but had severe nonbloody diarrhea and therefore dosage was decreased to twice a day.   Patient began having more progressive weakness beginning this past Saturday and is no longer able to get out of bed without help. Because of his generalized weakness he was unable to attend dialysis today. He denies any shortness of breath but he has noted some difficulty swallowing without any choking or coughing with eating and he has been able to manage his oral secretions. In the ER chest x-ray was unremarkable. INR supratherapeutic at 5.9. He did have low-grade temperature 99.3 with borderline tachypnea but was otherwise normotensive and was maintaining O2 saturations 100%. He was also found to be Hemoccult positive without any gross blood noted on DRE. His symptoms are concerning for possible evolving myasthenia crisis therefore neurology has been consulted. We have been asked to admit the patient.   Subjective: 7/3  patient in acute respiratory distress (flash pulmonary edema). Negative CP, positive SOB. Per wife Pacer placed 2dary to A-Fib    Assessment & Plan:   Principal Problem:   Myasthenia gravis in crisis [facility]) Active Problems:  Diabetes mellitus without complication ([place])   History of CVA in adulthood   Atrial fibrillation, permanent (HCC)   Supratherapeutic INR   HTN (hypertension)   Dyslipidemia   ESRD on dialysis ([place])   Anemia   Sepsis unspecified organism -Patient from [place] in June, Temp > 38 high fever, RR> 20, HR> 90 bpm. -Current temp 40.5C -Start Zosyn and vancomycin per pharmacy -Blood culture and urine culture pending   Myasthenia gravis in ?? crisis  -Newly diagnosed MG with intolerance to drug initiation (Mestinon) as evidenced by diarrhea now presenting with generalized weakness and subjective reports of difficulty swallowing without respiratory symptoms concerning for possible myasthenia crisis -Neurology consulted: IVIG 5 days, -Started on Solu-Medrol 80 mg daily -NIF/FVC every 12 hours: 7/2 NIF= -22/FVC 1.5 L (unable to obtain today secondary to respiratory distress)  -Frequent neurological checks every 2 hours -PT/OT evaluation appropriate  Acute respiratory failure with hypoxia/Flash Pulmonary Edema -Multifactorial to include MG crisis  + flash pulmonary edema -Initiate BiPAP -Titrate to maintain SPO2> 93% -Emergent HD on 7/3 -PCXR on 7/4 -ABG on 7/4  Supratherapeutic INR -Recent difficulties with INR after initiation of Mestinon -FOB positive without gross bleeding hemoglobin has drifted downward (see below) -Hold warfarin and pharmacy to manage -Hold baby aspirin -Warfarin on hold secondary to supratherapeutic INR -Patient received vitamin K, 1mg  on admission Recent Labs Lab 03/17/17 1304 03/18/17 0245 03/18/17 1251  INR 5.90* 6.78* 2.64   Diabetes mellitus Type II with renal complication  -3/50 Hemoglobin A1c= 7.0 -Hold all oral diabetic medication -Sensitive SSI    Atrial fibrillation, KXFGHWEXH(BZJIRCVE=9)/FYBOFBP Diastolic CHF -Per wife pacer placed secondary  to A. fib -Coreg 3.125 mg BID on hold secondary to being on BiPAP -Metoprolol 2.5 mg  TID -see supratherapeutic INR -Transfuse for hemoglobin<8  Essential HTN (hypertension) -Currently controlled -See A. fib    ESRD on dialysis(M/W/F)  -Missed Monday HD -Patient fluid overloaded emergent HD today    Anemia -Baseline hemoglobin 10.2 -Current hemoglobin 8.1 -FOB positive without gross bleeding noted on DRE nor by patient at home -Possible mild hemodilution contributing -Follow labs and monitor for development of bleeding -Anemia panel/haptoglobin/LDH pending,     History of CVA in adulthood -Right subcortical CVA mid May of this year -Current symptoms not consistent with stroke physiology    Dyslipidemia -On Zetia and fenofibrate prior to admission  Hypokalemia -Potassium goal> 4 -Potassium 40 mEq    DVT prophylaxis: SCD Code Status: Full Family Communication: Wife at bedside Disposition Plan: TBD   Consultants:  Nephrology PC CM    Procedures/Significant Events:  None   VENTILATOR SETTINGS: BiPAP Set rate: 10 IPAP: 14 EPAP: 7 FiO2: 50%  Cultures 7/3 blood pending 7/3 urine pending    Antimicrobials: Anti-infectives    Start     Stop   03/19/17 1200  vancomycin (VANCOCIN) IVPB 750 mg/150 ml premix         03/18/17 1600  vancomycin (VANCOCIN) 1,750 mg in sodium chloride 0.9 % 250 mL IVPB     03/18/17 1830   03/18/17 1400  piperacillin-tazobactam (ZOSYN) IVPB 3.375 g             Devices    LINES / TUBES:      Continuous Infusions: . ferric gluconate (FERRLECIT/NULECIT) IV    . [START ON 03/19/2017] ferric gluconate (FERRLECIT/NULECIT) IV       Objective: Vitals:   03/17/17 2230 03/18/17 0014 03/18/17 0336 03/18/17 0700  BP: (!) 141/54 122/80 (!) 145/66 (!) 137/48  Pulse: 81 79 80   Resp: (!) 22 (!) 21 (!) 25   Temp: 99.6 F (37.6 C)  98.9 F (37.2 C)   TempSrc: Oral  Oral   SpO2: 93% 95% 94%   Weight:   175 lb 0.7 oz (79.4 kg)   Height:        Intake/Output Summary (Last 24 hours) at 03/18/17  0830 Last data filed at 03/18/17 0541  Gross per 24 hour  Intake           468.68 ml  Output                0 ml  Net           468.68 ml   Filed Weights   03/17/17 1722 03/18/17 0336  Weight: 174 lb 2.6 oz (79 kg) 175 lb 0.7 oz (79.4 kg)    Examination:  General: A/O 4, positive acute respiratory distress Eyes: negative scleral hemorrhage, negative anisocoria, negative icterus ENT: Negative Runny nose, negative gingival bleeding, Neck:  Negative scars, masses, torticollis, lymphadenopathy, JVD Lungs: tachypnea, positive rhonchi, positive wheezing, positive crackles Cardiovascular: Tachycardic, Regular rhythm without murmur gallop or rub normal S1 and S2 Abdomen: negative abdominal pain, nondistended, positive soft, bowel sounds, no rebound, no ascites, no appreciable mass Extremities: No significant cyanosis, clubbing, or edema bilateral lower extremities Skin: Negative rashes, lesions, ulcers Psychiatric:  Negative depression, negative anxiety, negative fatigue, negative mania  Central nervous system:  Cranial nerves II through XII intact, tongue/uvula midline, all extremities muscle strength 5/5, sensation intact throughout, negative dysarthria, negative expressive aphasia, negative receptive aphasia.  Marland Kitchen  Data Reviewed: Care during the described time interval was provided by me .  I have reviewed this patient's available data, including medical history, events of note, physical examination, and all test results as part of my evaluation. I have personally reviewed and interpreted all radiology studies.  CBC:  Recent Labs Lab 03/17/17 1257 03/18/17 0245  WBC 10.2 6.5  NEUTROABS 8.8*  --   HGB 8.1* 7.8*  HCT 25.5* 24.2*  MCV 96.2 95.3  PLT 184 831   Basic Metabolic Panel:  Recent Labs Lab 03/17/17 1257 03/17/17 1817 03/18/17 0245  NA 133*  --  132*  K 3.3*  --  2.9*  CL 93*  --  95*  CO2 28  --  26  GLUCOSE 147*  --  109*  BUN 54*  --  60*  CREATININE  9.23*  --  10.09*  CALCIUM 8.9  --  8.5*  MG  --  2.2  --   PHOS  --  3.5  --    GFR: Estimated Creatinine Clearance: 6.2 mL/min (A) (by C-G formula based on SCr of 10.09 mg/dL (H)). Liver Function Tests:  Recent Labs Lab 03/17/17 1257 03/18/17 0245  AST 17 12*  ALT 15* 14*  ALKPHOS 34* 33*  BILITOT 0.7 0.7  PROT 5.5* 5.9*  ALBUMIN 3.0* 2.7*   No results for input(s): LIPASE, AMYLASE in the last 168 hours. No results for input(s): AMMONIA in the last 168 hours. Coagulation Profile:  Recent Labs Lab 03/17/17 1304 03/18/17 0245  INR 5.90* 6.78*   Cardiac Enzymes: No results for input(s): CKTOTAL, CKMB, CKMBINDEX, TROPONINI in the last 168 hours. BNP (last 3 results) No results for input(s): PROBNP in the last 8760 hours. HbA1C: No results for input(s): HGBA1C in the last 72 hours. CBG:  Recent Labs Lab 03/17/17 1754 03/17/17 2104 03/18/17 0740  GLUCAP 163* 159* 75   Lipid Profile: No results for input(s): CHOL, HDL, LDLCALC, TRIG, CHOLHDL, LDLDIRECT in the last 72 hours. Thyroid Function Tests: No results for input(s): TSH, T4TOTAL, FREET4, T3FREE, THYROIDAB in the last 72 hours. Anemia Panel: No results for input(s): VITAMINB12, FOLATE, FERRITIN, TIBC, IRON, RETICCTPCT in the last 72 hours. Urine analysis:    Component Value Date/Time   COLORURINE YELLOW 02/16/2017 1824   APPEARANCEUR TURBID (A) 02/16/2017 1824   LABSPEC 1.012 02/16/2017 1824   PHURINE 7.0 02/16/2017 1824   GLUCOSEU NEGATIVE 02/16/2017 1824   HGBUR SMALL (A) 02/16/2017 1824   BILIRUBINUR NEGATIVE 02/16/2017 1824   KETONESUR NEGATIVE 02/16/2017 1824   PROTEINUR >=300 (A) 02/16/2017 1824   NITRITE NEGATIVE 02/16/2017 1824   LEUKOCYTESUR LARGE (A) 02/16/2017 1824   Sepsis Labs: @LABRCNTIP (procalcitonin:4,lacticidven:4)  )No results found for this or any previous visit (from the past 240 hour(s)).       Radiology Studies: Dg Chest 2 View  Result Date: 03/17/2017 CLINICAL DATA:   Generalize weakness for the past 3 days. History of atrial fibrillation, diabetes, end-stage renal disease. EXAM: CHEST  2 VIEW COMPARISON:  Chest x-ray of February 19, 2017 FINDINGS: The lungs are adequately inflated and clear. The cardiac silhouette is mildly enlarged. The pulmonary vascularity is normal. The mediastinum is normal in width. The ICD is in stable position. The trachea is midline. The bony thorax exhibits no acute abnormality. There surgical clips in the left axillary region. IMPRESSION: There is no pneumonia, CHF, nor other acute cardiopulmonary abnormality. Electronically Signed   By: David  Martinique M.D.   On: 03/17/2017 13:41  Scheduled Meds: . darbepoetin (ARANESP) injection - DIALYSIS  100 mcg Intravenous Once in dialysis  . [START ON 03/19/2017] doxercalciferol  4 mcg Intravenous Q M,W,F-HD  . Immune Globulin 10%  400 mg/kg Intravenous Q24 Hr x 5  . insulin aspart  0-5 Units Subcutaneous QHS  . insulin aspart  0-9 Units Subcutaneous TID WC  . sodium chloride flush  3 mL Intravenous Q12H  . Warfarin - Pharmacist Dosing Inpatient   Does not apply q1800   Continuous Infusions: . ferric gluconate (FERRLECIT/NULECIT) IV    . [START ON 03/19/2017] ferric gluconate (FERRLECIT/NULECIT) IV       LOS: 1 day    Time spent: 40 minutes    WOODS, Geraldo Docker, MD [facility] Pager [phone]   If 7PM-7AM, please contact night-coverage [email or web address] Password [email or web address] 03/18/2017, 8:30 AM

## 2017-03-18 NOTE — Progress Notes (Signed)
OT Cancellation Note  Patient Details Name: James David MRN: [id number] DOB: [birth year]   Cancelled Treatment:    Reason Eval/Treat Not Completed: Medical issues which prohibited therapy ( transferred to 4n27 respiratory distress at this time with temperature 104.9)  Vonita Moss   OTR/L Pager: [phone] Office: [phone] .  03/18/2017, 12:10 PM

## 2017-03-18 NOTE — Progress Notes (Signed)
PT in distress. RN cabinet override. Administered by RT.

## 2017-03-18 NOTE — Progress Notes (Signed)
Patient with respiratory distress, 100% nonrebreather applied.  Dr. Sherral Hammers paged to come to bedside.  Rapid Response called to bedside.  Temp 104.9 rectal.  Dr. Sherral Hammers at bedside.  Continued increased respirations with abdominal breathing.  Patient being transported to Hemodialysis for emergent dialysis.  Report given to [facility] in hemodialysis.  Dame to verify with their Leadership if they can manage cooling blanket.  James David

## 2017-03-18 NOTE — Consult Note (Signed)
Name: James David MRN: [id number] DOB: September 24, 1935    ADMISSION DATE:  03/17/2017 CONSULTATION DATE:  03/18/17  REFERRING MD :  Sherral Hammers  CHIEF COMPLAINT:  SOB   HISTORY OF PRESENT ILLNESS:  James David is a 81 y.o. male with a PMH as outlined below including but not limited to recent diagnosis of probable Myasthenia Gravis (was seen as outpatient and had positive acetylcholine receptor antibodies).  He was started on mestinon but did not tolerate it due to severe diarrhea.  He has had progressive weakness for the past month or two to the point where he has had difficulty getting out of bed and has had difficulty swallowing.  He has not had any diplopia or SOB at home.  He was admitted 7/2 with generalized weakness.  He was evaluated by neurology who felt that he likely did have Myasthenia and recommended treatment with IVIG for 5 daily doses (holding steroids for a few days).  On 7/3, he had hypoxia with mild increased WOB; therefore, PCCM was asked to see.  He was placed on NRB which he feels is helping.  He has strong cough and is able to control airway well.  He denies sensation of respiratory fatigue.  He is awaiting his scheduled hemodialysis (he did receive 80mg  lasix prior to [place] seeing him.  [place] urine documented as - 95).   PAST MEDICAL HISTORY :   has a past medical history of Atrial fibrillation ([place]); Chronic kidney disease, stage IV (severe) ([place]); DDD (degenerative disc disease); Diabetes mellitus without complication ([place]); ESRD (end stage renal disease) on dialysis ([place]); Hyperlipidemia; Hypertension; Melanoma ([place]) (2010); Second degree heart block; and Stroke ([place]) (01/2017).  has a past surgical history that includes Laminectomy; Colonoscopy; Cataract extraction w/ intraocular lens  implant, bilateral; AV fistula placement (Right, 05/31/2014); permanent pacemaker insertion (N/A, 01/20/2014); and Excision melanoma with sentinel lymph node biopsy (Right, 02/08/2016). Prior to Admission  medications   Medication Sig Start Date End Date Taking? Authorizing Provider  acetaminophen (TYLENOL) 500 MG tablet Take 500 mg by mouth every 8 (eight) hours as needed for mild pain. (pain/headaches)   Yes [provider]  aspirin EC 81 MG tablet Take 81 mg by mouth at bedtime.   Yes [provider]  calcium acetate (PHOSLO) 667 MG capsule Take 2 capsules (1,334 mg total) by mouth 3 (three) times daily. Patient taking differently: Take 667-1,334 mg by mouth See admin instructions. 1,334 mg three times a day after meals and 667 mg with each snack 02/14/17  Yes Angiulli, Lavon Paganini, PA-C  carvedilol (COREG) 3.125 MG tablet Take 1 tablet (3.125 mg total) by mouth 2 (two) times daily with a meal. 02/14/17  Yes Angiulli, Lavon Paganini, PA-C  ezetimibe (ZETIA) 10 MG tablet Take 1 tablet (10 mg total) by mouth daily. 02/14/17  Yes Angiulli, Lavon Paganini, PA-C  fenofibrate 160 MG tablet Take 1 tablet (160 mg total) by mouth daily. 02/14/17  Yes Angiulli, Lavon Paganini, PA-C  finasteride (PROSCAR) 5 MG tablet Take 1 tablet (5 mg total) by mouth daily. 02/14/17  Yes Angiulli, Lavon Paganini, PA-C  gabapentin (NEURONTIN) 100 MG capsule Take 1 capsule (100 mg total) by mouth at bedtime. 02/14/17  Yes Angiulli, Lavon Paganini, PA-C  glimepiride (AMARYL) 1 MG tablet Take 1 tablet (1 mg total) by mouth daily with breakfast. 02/14/17  Yes Angiulli, Lavon Paganini, PA-C  Hypromellose (ARTIFICIAL TEARS OP) Place 1 drop into both eyes 3 (three) times daily as needed (for dry eyes).  Yes [provider]  ipratropium (ATROVENT) 0.03 % nasal spray Place 2 sprays into both nostrils daily as needed for rhinitis.    Yes [provider]  Multiple Vitamin (DAILY-VITE PO) Take 1 tablet by mouth at bedtime. WITH ZINC   Yes [provider]  pyridostigmine (MESTINON) 60 MG tablet Take 1/2 to 1 pill po tid Patient taking differently: Take 30 mg by mouth 2 (two) times daily. BREAKFAST AND SUPPERTIME 03/03/17  Yes Sater, Nanine Means,  MD  warfarin (COUMADIN) 1 MG tablet 1 1/2 tab daily Patient taking differently: Take 1-1.5 mg by mouth See admin instructions. 1.5 mg with supper on Sun/Tues/Thurs/Sat and 1 mg on Mon/Wed/Fri 02/14/17  Yes Angiulli, Lavon Paganini, PA-C  multivitamin (RENA-VIT) TABS tablet Take 1 tablet by mouth at bedtime. Patient not taking: Reported on 03/17/2017 02/14/17   Angiulli, Lavon Paganini, PA-C  rOPINIRole (REQUIP) 0.5 MG tablet Take 1 tablet (0.5 mg total) by mouth at bedtime. Patient not taking: Reported on 03/17/2017 02/14/17   Angiulli, Lavon Paganini, PA-C  senna-docusate (SENOKOT-S) 8.6-50 MG tablet Take 2 tablets by mouth at bedtime. Patient not taking: Reported on 03/04/2017 02/14/17   Angiulli, Lavon Paganini, PA-C   Allergies  Allergen Reactions  . Amlodipine Besylate Swelling    Angioedema   . Lisinopril Other (See Comments)    Renal insufficiency  . Statins Other (See Comments)    Muscle aches  . Ropinirole Other (See Comments)    Causes excessive grogginess the following morning after taking  . Tape Other (See Comments)    SKIN IS THIN AND TAPE WILL ACTUALLY TEAR IT; PLEASE USE COBAN WRAP INSTEAD!!    FAMILY HISTORY:  family history includes Diabetes in his brother and mother; Heart attack in his father; Pulmonary embolism in his mother. SOCIAL HISTORY:  reports that he has never smoked. He has never used smokeless tobacco. He reports that he does not drink alcohol or use drugs.  REVIEW OF SYSTEMS:   All negative; except for those that are bolded, which indicate positives.  Constitutional: weight loss, weight gain, night sweats, fevers, chills, fatigue, weakness.  HEENT: headaches, sore throat, sneezing, nasal congestion, post nasal drip, difficulty swallowing, tooth/dental problems, visual complaints, visual changes, ear aches. Neuro: difficulty with speech, weakness, numbness, ataxia. CV:  chest pain, orthopnea, PND, swelling in lower extremities, dizziness, palpitations, syncope.  Resp: cough,  hemoptysis, dyspnea, wheezing. GI: heartburn, indigestion, abdominal pain, nausea, vomiting, diarrhea, constipation, change in bowel habits, loss of appetite, hematemesis, melena, hematochezia.  GU: dysuria, change in color of urine, urgency or frequency, flank pain, hematuria. MSK: joint pain or swelling, decreased range of motion. Psych: change in mood or affect, depression, anxiety, suicidal ideations, homicidal ideations. Skin: rash, itching, bruising.   SUBJECTIVE:  Has mild dyspnea but does not feel tired.  Able to follow commands.  Good cough, controlling airway well.  VITAL SIGNS: Temp:  [98.6 F (37 C)-104.9 F (40.5 C)] 104.9 F (40.5 C) (07/03 1112) Pulse Rate:  [73-156] 101 (07/03 1112) Resp:  [10-45] 45 (07/03 1112) BP: (122-167)/(44-82) 165/57 (07/03 1112) SpO2:  [93 %-100 %] 100 % (07/03 1112) Weight:  [79 kg (174 lb 2.6 oz)-79.4 kg (175 lb 0.7 oz)] 79.4 kg (175 lb 0.7 oz) (07/03 0336)  PHYSICAL EXAMINATION: General: Adult male, mild increased WOB. Neuro: A&O x 3, some LE weakness. HEENT: [place] / AT. MMM. Cardiovascular: IRIR, no M/R/G. Lungs: Coarse crackles throughout.  Mild increased WOB. Abdomen: BS x 4, S/NT/ND. Musculoskeletal: No deformities.  1+  edema. Skin: Warm, dry.     Recent Labs Lab 03/17/17 1257 03/18/17 0245  NA 133* 132*  K 3.3* 2.9*  CL 93* 95*  CO2 28 26  BUN 54* 60*  CREATININE 9.23* 10.09*  GLUCOSE 147* 109*    Recent Labs Lab 03/17/17 1257 03/18/17 0245  HGB 8.1* 7.8*  HCT 25.5* 24.2*  WBC 10.2 6.5  PLT 184 198   Dg Chest 2 View  Result Date: 03/17/2017 CLINICAL DATA:  Generalize weakness for the past 3 days. History of atrial fibrillation, diabetes, end-stage renal disease. EXAM: CHEST  2 VIEW COMPARISON:  Chest x-ray of February 19, 2017 FINDINGS: The lungs are adequately inflated and clear. The cardiac silhouette is mildly enlarged. The pulmonary vascularity is normal. The mediastinum is normal in width. The ICD is in stable  position. The trachea is midline. The bony thorax exhibits no acute abnormality. There surgical clips in the left axillary region. IMPRESSION: There is no pneumonia, CHF, nor other acute cardiopulmonary abnormality. Electronically Signed   By: David  Martinique M.D.   On: 03/17/2017 13:41    STUDIES:  CXR 7/2 > no acute process.  SIGNIFICANT EVENTS  7/2 > admit. 7/3 > PCCM consult.  DISCUSSION: 81 year old male with ESRD and recent diagnosis of MG who presents to PCCM with increased WOB and hypoxemic respiratory failure due to acute pulmonary edema.  I reviewed CXR myself from 7/2 (none from today) and that was normal.  On exam, diffuse crackles noted.    ASSESSMENT / PLAN:  PULMONARY A: Acute hypoxemic respiratory failure due to acute pulmonary edema.  I do not believe myasthenia gravis is playing a large factor here.  Patient is being wheeled to dialysis right now so unfortunately will not be able to get a CXR right now. P:   - BiPAP for comfort - Titrate O2 for sat of 88-92% - Monitor closely for airway protection - Premedicate prior to BiPAP with zofran 4 mg IV x1 - If vomits then will need intubation unless significant improvement with dialysis - Check NIF (<-40) and VC.  CARDIOVASCULAR A:  HTN P:  - Hold anti-HTN for nowj - Tele monitoring  RENAL A:   ESRD P:   - HD now - BMET in AM - Replace electrolytes as indicated  GASTROINTESTINAL A:   N/V P:   - Zofran PRN - NPO  HEMATOLOGIC A:   Anemia Coagulopathy P:  - INR in AM - Transfuse per ICU protocol - CBC in AM - Hold coumadin  INFECTIOUS A:   No active issues P:   - Monitor WBC and fever curve  ENDOCRINE A:   DM   P:   - ISS - CBG - On steroids  NEUROLOGIC A:   MG P:   - Solumedrol - Neurology monitoring  FAMILY  - Updates: Patient updated bedside, discussed with TRH-MD, transfer to the ICU for monitoring.  If post dialysis there is no improvement then will need to discuss  intubation.  Full code for now.  - Inter-disciplinary family meet or Palliative Care meeting due by:  day 7  The patient is critically ill with multiple organ systems failure and requires high complexity decision making for assessment and support, frequent evaluation and titration of therapies, application of advanced monitoring technologies and extensive interpretation of multiple databases.   Critical Care Time devoted to patient care services described in this note is  35  Minutes. This time reflects time of care of this signee Dr  Jennet Maduro. This critical care time does not reflect procedure time, or teaching time or supervisory time of PA/NP/Med student/Med Resident etc but could involve care discussion time.  Rush Farmer, M.D. [facility] Pulmonary/Critical Care Medicine. Pager: [phone]. After hours pager: [phone].  03/18/2017, 11:35 AM

## 2017-03-18 NOTE — Progress Notes (Signed)
RT assisted ICU MD with intubation. ETCO2 positive color change. Bilateral breath sounds present. CXR and ABG pending. Due to patient being intubated and sedated NIF/VC can not be done at this time. Will continue when patient is more stable.

## 2017-03-18 NOTE — Progress Notes (Signed)
Pharmacy Antibiotic Note James David is a 81 y.o. male admitted on 03/17/2017 with weakness. Transferred to ICU today due to respiratory distress and being febrile to almost 105. Planning to start broad spectrum antibiotics, empirically. He is ESRD on HD - normally on MWF, but planning to receive an extra session today.  Plan: -Vancomycin 1750 mg IV x1 then 750 mg IV qHD-MWF -Zosyn 3.375 g IV q12h -Monitor HD schedule and tolerance  Height: 5\' 10"  (177.8 cm) Weight: 175 lb 0.7 oz (79.4 kg) IBW/kg (Calculated) : 73  Temp (24hrs), Avg:99.8 F (37.7 C), Min:98.6 F (37 C), Max:104.9 F (40.5 C)   Recent Labs Lab 03/17/17 1257 03/18/17 0245  WBC 10.2 6.5  CREATININE 9.23* 10.09*    Estimated Creatinine Clearance: 6.2 mL/min (A) (by C-G formula based on SCr of 10.09 mg/dL (H)).    Allergies  Allergen Reactions  . Amlodipine Besylate Swelling    Angioedema   . Lisinopril Other (See Comments)    Renal insufficiency  . Statins Other (See Comments)    Muscle aches  . Ropinirole Other (See Comments)    Causes excessive grogginess the following morning after taking  . Tape Other (See Comments)    SKIN IS THIN AND TAPE WILL ACTUALLY TEAR IT; PLEASE USE COBAN WRAP INSTEAD!!    Antimicrobials this admission: 7/3 vancomycin > 7/3 zosyn >   Dose adjustments this admission: N/A   Microbiology results: 7/3 blood cx: 7/3 urine cx:   [name] 03/18/2017 12:51 PM

## 2017-03-18 NOTE — Progress Notes (Signed)
Lab notified RN that Patient's new INR result came back as 6.78. Other new lab results for patient was potassium of 2.9 and Hgb of 7.8. RN notified on call [facility] provider. New orders to administer 1mg  of vitamin K and 37meq of potassium. Will continue to monitor.

## 2017-03-18 NOTE — Progress Notes (Signed)
Patient transported to CT and back without incidence.

## 2017-03-18 NOTE — Progress Notes (Signed)
Upon assessment, Pt was found with tremors and rigors. Assessed motor function and muscle tone. Notified Dr Sherral Hammers and Neuro. Will continue to monitor.

## 2017-03-18 NOTE — Progress Notes (Signed)
Patient has had dialysis and remains on bi-pap at this time. NIF and VC have not been done due to these circumstances.

## 2017-03-18 NOTE — Progress Notes (Signed)
eLink Physician-Brief Progress Note Patient Name: James David DOB: [birth year] MRN: [id number]   Date of Service  03/18/2017  HPI/Events of Note  Unable to draw lab work from arm. Request to stick feet for lab work.   eICU Interventions  May stick feet for lab work.      Intervention Category Major Interventions: Hyperglycemia - active titration of insulin therapy  [name] 03/18/2017, 8:22 PM

## 2017-03-18 NOTE — Procedures (Signed)
Endotracheal Intubation Procedure Note Indication for endotracheal intubation: respiratory failure Sedation: etomidate Paralytic: succinylcholine Equipment: Macintosh 3 laryngoscope blade and 8.86mm cuffed endotracheal tube Cricoid Pressure: yes Number of attempts: 1 ETT location confirmed by by auscultation, by CXR and ETCO2 monitor.  Patient in [place] crisis with impending respiratory failure on BIPAP. Decision made to intubate. On continuous telemetry and Pox with frequent BP's throughout procedure. Preoxygenated with 100% Ambubag. Sedated and paralyzed as above. Grade 1 view of cords. Tolerated procedure well with no complications. No blood loss. CXR pending.   Vernie Murders, MD Pulmonary & Critical Care

## 2017-03-18 NOTE — Progress Notes (Signed)
Upon assessment at 1730 noted pt no longer responding to commands. No effort against gravity on bil UE. Non purposeful movement LE. Pt will w/d to pain on all four extremities. Pt remains on bipap, consistent adequate volumes. STAT ABG ordered. Dr. Leonel Ramsay paged and at bedside to assess.

## 2017-03-18 NOTE — Significant Event (Signed)
Rapid Response Event Note  Overview:  Called by RN for patient with respiratory distress Time Called: [id number] Arrival Time: 1052 Event Type: Respiratory  Initial Focused Assessment:  Called by RN for patient with respiratory distress.  Patient is on NRB, RR 40's abdominal breathing, 165/57, HR 101.     Interventions:  Rectal temp 104.9, ice packs placed on patient.  Breathing treatment duo neb given.  Respiratory therapy at bedside.  Dr. Sherral Hammers at bedside.  CCM consulted at bedside.  Patient  placed on bipap per RT.    Plan of Care (if not transferred):  As per Dr. Sherral Hammers transfer patient to HD for emergent HD and then patient will transfer to ICU.  On arrival to HD unit, were told patient needed to go to ICU and HD would be done at bedside.  Tylenol suppository given on arrival to ICU.  Wife at bedside and has been updated by MD.    Event Summary:  PAtient transported to 4 N ICU and RN updated   at      at          [facility], Harlin Rain

## 2017-03-19 ENCOUNTER — Inpatient Hospital Stay (HOSPITAL_COMMUNITY): Payer: Medicare Other

## 2017-03-19 LAB — BLOOD CULTURE ID PANEL (REFLEXED)
Acinetobacter baumannii: NOT DETECTED
CANDIDA TROPICALIS: NOT DETECTED
CARBAPENEM RESISTANCE: NOT DETECTED
Candida albicans: NOT DETECTED
Candida glabrata: NOT DETECTED
Candida krusei: NOT DETECTED
Candida parapsilosis: NOT DETECTED
Enterobacter cloacae complex: NOT DETECTED
Enterobacteriaceae species: DETECTED — AB
Enterococcus species: NOT DETECTED
Escherichia coli: DETECTED — AB
HAEMOPHILUS INFLUENZAE: NOT DETECTED
Klebsiella oxytoca: NOT DETECTED
Klebsiella pneumoniae: NOT DETECTED
LISTERIA MONOCYTOGENES: NOT DETECTED
NEISSERIA MENINGITIDIS: NOT DETECTED
PROTEUS SPECIES: NOT DETECTED
Pseudomonas aeruginosa: NOT DETECTED
SERRATIA MARCESCENS: NOT DETECTED
STAPHYLOCOCCUS AUREUS BCID: NOT DETECTED
STAPHYLOCOCCUS SPECIES: NOT DETECTED
STREPTOCOCCUS AGALACTIAE: NOT DETECTED
STREPTOCOCCUS SPECIES: NOT DETECTED
Streptococcus pneumoniae: NOT DETECTED
Streptococcus pyogenes: NOT DETECTED

## 2017-03-19 LAB — CBC
HCT: 23 % — ABNORMAL LOW (ref 39.0–52.0)
Hemoglobin: 7.3 g/dL — ABNORMAL LOW (ref 13.0–17.0)
MCH: 30.9 pg (ref 26.0–34.0)
MCHC: 31.7 g/dL (ref 30.0–36.0)
MCV: 97.5 fL (ref 78.0–100.0)
PLATELETS: 192 10*3/uL (ref 150–400)
RBC: 2.36 MIL/uL — ABNORMAL LOW (ref 4.22–5.81)
RDW: 14.7 % (ref 11.5–15.5)
WBC: 16.2 10*3/uL — AB (ref 4.0–10.5)

## 2017-03-19 LAB — HEPATITIS B SURFACE ANTIGEN: HEP B S AG: NEGATIVE

## 2017-03-19 LAB — BASIC METABOLIC PANEL
Anion gap: 12 (ref 5–15)
BUN: 23 mg/dL — AB (ref 6–20)
CO2: 24 mmol/L (ref 22–32)
CREATININE: 5.45 mg/dL — AB (ref 0.61–1.24)
Calcium: 7.6 mg/dL — ABNORMAL LOW (ref 8.9–10.3)
Chloride: 96 mmol/L — ABNORMAL LOW (ref 101–111)
GFR calc Af Amer: 10 mL/min — ABNORMAL LOW (ref >60)
GFR, EST NON AFRICAN AMERICAN: 9 mL/min — AB (ref >60)
GLUCOSE: 211 mg/dL — AB (ref 65–99)
POTASSIUM: 4.6 mmol/L (ref 3.5–5.1)
Sodium: 132 mmol/L — ABNORMAL LOW (ref 135–145)

## 2017-03-19 LAB — GLUCOSE, CAPILLARY
GLUCOSE-CAPILLARY: 105 mg/dL — AB (ref 65–99)
GLUCOSE-CAPILLARY: 264 mg/dL — AB (ref 65–99)
Glucose-Capillary: 126 mg/dL — ABNORMAL HIGH (ref 65–99)
Glucose-Capillary: 201 mg/dL — ABNORMAL HIGH (ref 65–99)
Glucose-Capillary: 272 mg/dL — ABNORMAL HIGH (ref 65–99)

## 2017-03-19 LAB — PROTIME-INR
INR: 1.79
PROTHROMBIN TIME: 21.1 s — AB (ref 11.4–15.2)

## 2017-03-19 LAB — MAGNESIUM
Magnesium: 1.8 mg/dL (ref 1.7–2.4)
Magnesium: 2.6 mg/dL — ABNORMAL HIGH (ref 1.7–2.4)

## 2017-03-19 LAB — PHOSPHORUS
PHOSPHORUS: 4.6 mg/dL (ref 2.5–4.6)
Phosphorus: 3.9 mg/dL (ref 2.5–4.6)

## 2017-03-19 LAB — PROCALCITONIN: Procalcitonin: >150 ng/mL

## 2017-03-19 LAB — MRSA PCR SCREENING: MRSA BY PCR: NEGATIVE

## 2017-03-19 LAB — CORTISOL: CORTISOL PLASMA: 18.7 ug/dL

## 2017-03-19 LAB — HAPTOGLOBIN: HAPTOGLOBIN: 318 mg/dL — AB (ref 34–200)

## 2017-03-19 MED ORDER — SODIUM CHLORIDE 0.9 % IV SOLN
0.0000 ug/min | INTRAVENOUS | Status: DC
  Administered 2017-03-19: 35 ug/min via INTRAVENOUS
  Filled 2017-03-19: qty 4

## 2017-03-19 MED ORDER — MAGNESIUM SULFATE 2 GM/50ML IV SOLN
2.0000 g | Freq: Once | INTRAVENOUS | Status: AC
  Administered 2017-03-19: 2 g via INTRAVENOUS
  Filled 2017-03-19: qty 50

## 2017-03-19 MED ORDER — SODIUM CHLORIDE 0.9% FLUSH
10.0000 mL | INTRAVENOUS | Status: DC | PRN
  Administered 2017-03-23: 10 mL
  Administered 2017-03-25: 20 mL
  Filled 2017-03-19 (×2): qty 40

## 2017-03-19 MED ORDER — IOPAMIDOL (ISOVUE-370) INJECTION 76%
INTRAVENOUS | Status: AC
  Administered 2017-03-19: 90 mL
  Filled 2017-03-19: qty 100

## 2017-03-19 MED ORDER — ASPIRIN EC 325 MG PO TBEC
325.0000 mg | DELAYED_RELEASE_TABLET | Freq: Every day | ORAL | Status: DC
Start: 2017-03-19 — End: 2017-03-19

## 2017-03-19 MED ORDER — PRO-STAT SUGAR FREE PO LIQD
30.0000 mL | Freq: Every day | ORAL | Status: DC
  Administered 2017-03-19 (×3): 30 mL
  Filled 2017-03-19 (×2): qty 30

## 2017-03-19 MED ORDER — CHLORHEXIDINE GLUCONATE CLOTH 2 % EX PADS
6.0000 | MEDICATED_PAD | Freq: Every day | CUTANEOUS | Status: DC
  Administered 2017-03-19 – 2017-03-23 (×6): 6 via TOPICAL

## 2017-03-19 MED ORDER — SODIUM CHLORIDE 0.9 % IV SOLN
0.0000 ug/min | INTRAVENOUS | Status: DC
  Filled 2017-03-19 (×2): qty 1

## 2017-03-19 MED ORDER — SODIUM CHLORIDE 0.9% FLUSH
10.0000 mL | Freq: Two times a day (BID) | INTRAVENOUS | Status: DC
  Administered 2017-03-19 – 2017-03-23 (×9): 10 mL
  Administered 2017-03-24: 20 mL
  Administered 2017-03-25: 30 mL

## 2017-03-19 MED ORDER — ASPIRIN 325 MG PO TABS
325.0000 mg | ORAL_TABLET | Freq: Every day | ORAL | Status: DC
  Administered 2017-03-19 – 2017-03-26 (×8): 325 mg via ORAL
  Filled 2017-03-19 (×8): qty 1

## 2017-03-19 MED ORDER — INSULIN ASPART 100 UNIT/ML ~~LOC~~ SOLN
0.0000 [IU] | SUBCUTANEOUS | Status: DC
  Administered 2017-03-19: 3 [IU] via SUBCUTANEOUS
  Administered 2017-03-19 – 2017-03-20 (×3): 5 [IU] via SUBCUTANEOUS
  Administered 2017-03-20: 9 [IU] via SUBCUTANEOUS
  Administered 2017-03-20: 3 [IU] via SUBCUTANEOUS
  Administered 2017-03-20: 5 [IU] via SUBCUTANEOUS
  Administered 2017-03-20: 3 [IU] via SUBCUTANEOUS
  Administered 2017-03-21 (×2): 5 [IU] via SUBCUTANEOUS
  Administered 2017-03-21: 2 [IU] via SUBCUTANEOUS
  Administered 2017-03-21: 9 [IU] via SUBCUTANEOUS
  Administered 2017-03-22: 1 [IU] via SUBCUTANEOUS
  Administered 2017-03-22: 7 [IU] via SUBCUTANEOUS
  Administered 2017-03-22 (×2): 3 [IU] via SUBCUTANEOUS
  Administered 2017-03-22 – 2017-03-23 (×3): 7 [IU] via SUBCUTANEOUS
  Administered 2017-03-23: 2 [IU] via SUBCUTANEOUS
  Administered 2017-03-23: 1 [IU] via SUBCUTANEOUS
  Administered 2017-03-24: 2 [IU] via SUBCUTANEOUS
  Administered 2017-03-24 – 2017-03-25 (×2): 3 [IU] via SUBCUTANEOUS

## 2017-03-19 MED ORDER — FAMOTIDINE 40 MG/5ML PO SUSR
20.0000 mg | Freq: Every day | ORAL | Status: DC
  Administered 2017-03-19 – 2017-03-25 (×5): 20 mg via ORAL
  Filled 2017-03-19 (×7): qty 2.5

## 2017-03-19 MED ORDER — NEPRO/CARBSTEADY PO LIQD
1000.0000 mL | ORAL | Status: DC
  Administered 2017-03-19: 1000 mL
  Filled 2017-03-19 (×4): qty 1000

## 2017-03-19 MED ORDER — STROKE: EARLY STAGES OF RECOVERY BOOK
Freq: Once | Status: AC
  Administered 2017-03-19: 1
  Filled 2017-03-19: qty 1

## 2017-03-19 NOTE — Progress Notes (Signed)
VC 556ml    NIF-35

## 2017-03-19 NOTE — Progress Notes (Signed)
Patient arrived from CT, Dr Jimmey Ralph on floor for intubation. Intubation with no complications, Limited IV access, CL requested for IVIG administration. Dose delayed due to single peripheral IV. CL placed by Moshe Cipro, NP. Will continue to monitor patient.

## 2017-03-19 NOTE — Procedures (Signed)
Patient was seen on dialysis and the procedure was supervised.  BFR 350  Via AVF BP is  107/54.   Patient appears to be tolerating treatment well  Clydia Nieves A 03/19/2017

## 2017-03-19 NOTE — Procedures (Signed)
History: 81 year old male with new gaze deviation  Sedation: Fentanyl  Technique: This is a 21 channel routine scalp EEG performed at the bedside with bipolar and monopolar montages arranged in accordance to the international 10/20 system of electrode placement. One channel was dedicated to EKG recording.    Background: There is a posterior dominant rhythm of 8 Hz. In addition even during maximal wakefulness, there is mild irregular diffuse theta and delta activity. Sleep structures are briefly seen and are symmetric in distribution.  Photic stimulation: Physiologic driving is not performed  EEG Abnormalities: 1) generalized irregular slow activity  Clinical Interpretation: This EEG is consistent with a mild generalized nonspecific cerebral dysfunction. There was no seizure or seizure predisposition recorded on this study. Please note that a normal EEG does not preclude the possibility of epilepsy.   Roland Rack, MD [facility] [phone]  If 7pm- 7am, please page neurology on call as listed in [place].

## 2017-03-19 NOTE — Progress Notes (Signed)
eLink Physician-Brief Progress Note Patient Name: James David DOB: [birth year] MRN: [id number]   Date of Service  03/19/2017  HPI/Events of Note  E coli bacteremia U/A with UTI  eICU Interventions  Stop vanc Continue zosyn     Intervention Category Major Interventions: Infection - evaluation and management  Simonne Maffucci 03/19/2017, 1:44 AM

## 2017-03-19 NOTE — Progress Notes (Signed)
VC 573ml and NIF -32

## 2017-03-19 NOTE — Progress Notes (Signed)
Transported pt to CT and back on vent

## 2017-03-19 NOTE — Progress Notes (Signed)
PT Cancellation Note  Patient Details Name: James David MRN: [id number] DOB: 07-09-1936   Cancelled Treatment:    Reason Eval/Treat Not Completed: Medical issues which prohibited therapy   Duncan Dull 03/19/2017, 9:50 AM Alben Deeds, PT DPT [facility] [phone]

## 2017-03-19 NOTE — Progress Notes (Signed)
EEG Completed; Results Pending  

## 2017-03-19 NOTE — Procedures (Signed)
Pt transported to & from CT via ventilator without complication.

## 2017-03-19 NOTE — Progress Notes (Signed)
OT Cancellation Note  Patient Details Name: James David MRN: [id number] DOB: 1936/07/31   Cancelled Treatment:    Reason Eval/Treat Not Completed: Medical issues which prohibited therapy;Patient not medically ready. Pt intubated, sedated, and currently on HD in room. Will follow up for OT eval as pt medically appropriate and as time allows.  Binnie Kand M.S., OTR/L Pager: [phone]  03/19/2017, 9:47 AM

## 2017-03-19 NOTE — Progress Notes (Signed)
PHARMACY - PHYSICIAN COMMUNICATION CRITICAL VALUE ALERT - BLOOD CULTURE IDENTIFICATION (BCID)  Results for orders placed or performed during the hospital encounter of 03/17/17  Blood Culture ID Panel (Reflexed) (Collected: 03/18/2017 10:17 AM)  Result Value Ref Range   Enterococcus species NOT DETECTED NOT DETECTED   Listeria monocytogenes NOT DETECTED NOT DETECTED   Staphylococcus species NOT DETECTED NOT DETECTED   Staphylococcus aureus NOT DETECTED NOT DETECTED   Streptococcus species NOT DETECTED NOT DETECTED   Streptococcus agalactiae NOT DETECTED NOT DETECTED   Streptococcus pneumoniae NOT DETECTED NOT DETECTED   Streptococcus pyogenes NOT DETECTED NOT DETECTED   Acinetobacter baumannii NOT DETECTED NOT DETECTED   Enterobacteriaceae species DETECTED (A) NOT DETECTED   Enterobacter cloacae complex NOT DETECTED NOT DETECTED   Escherichia coli DETECTED (A) NOT DETECTED   Klebsiella oxytoca NOT DETECTED NOT DETECTED   Klebsiella pneumoniae NOT DETECTED NOT DETECTED   Proteus species NOT DETECTED NOT DETECTED   Serratia marcescens NOT DETECTED NOT DETECTED   Carbapenem resistance NOT DETECTED NOT DETECTED   Haemophilus influenzae NOT DETECTED NOT DETECTED   Neisseria meningitidis NOT DETECTED NOT DETECTED   Pseudomonas aeruginosa NOT DETECTED NOT DETECTED   Candida albicans NOT DETECTED NOT DETECTED   Candida glabrata NOT DETECTED NOT DETECTED   Candida krusei NOT DETECTED NOT DETECTED   Candida parapsilosis NOT DETECTED NOT DETECTED   Candida tropicalis NOT DETECTED NOT DETECTED    Name of physician (or Provider) Contacted: Dr. Lake Bells   Changes to prescribed antibiotics required: D/C Vancomycin and continue Zosyn   Argie Ramming, PharmD Pharmacy Resident  Pager [phone] 03/19/17 1:32 AM

## 2017-03-19 NOTE — Progress Notes (Signed)
Name: James David MRN: [id number] DOB: 1936-03-03    ADMISSION DATE:  03/17/2017 CONSULTATION DATE:  03/18/17  REFERRING MD :  Sherral Hammers  CHIEF COMPLAINT:  SOB   brief:  James David is a 81 y.o. male with a PMH as outlined below including but not limited to recent diagnosis of probable Myasthenia Gravis (was seen as outpatient and had positive acetylcholine receptor antibodies).  He was started on mestinon but did not tolerate it due to severe diarrhea.  He has had progressive weakness for the past month or two to the point where he has had difficulty getting out of bed and has had difficulty swallowing.  He has not had any diplopia or SOB at home.  He was admitted 7/2 with generalized weakness.  He was evaluated by neurology who felt that he likely did have Myasthenia and recommended treatment with IVIG for 5 daily doses (holding steroids for a few days).  On 7/3, he had hypoxia with mild increased WOB; therefore, PCCM was asked to see.  He was placed on NRB which he feels is helping.  He has strong cough and is able to control airway well.  He denies sensation of respiratory fatigue.  He is awaiting his scheduled hemodialysis (he did receive 80mg  lasix prior to [place] seeing him.  Daly urine documented as - 76).   STUDIES:  CXR 7/2 > no acute process.  SIGNIFICANT EVENTS  7/2 > admit. 7/3 > PCCM consult.   Has mild dyspnea but does not feel tired.  Able to follow commands.  Good cough, controlling airway well.   SUBJECTIVE/OVERNIGHT/INTERVAL HX 7/4 - pn fent gtt with prn sedation. Not on pressors but bp soft. On IvIG and HD. Follows occ commands. Growing e colii in blood per RN  VITAL SIGNS: Temp:  [97.8 F (36.6 C)-104.9 F (40.5 C)] 98.5 F (36.9 C) (07/04 0800) Pulse Rate:  [66-110] 84 (07/04 0915) Resp:  [10-48] 15 (07/04 0915) BP: (80-165)/(24-127) 116/50 (07/04 0915) SpO2:  [87 %-100 %] 100 % (07/04 0915) FiO2 (%):  [40 %-60 %] 40 % (07/04 0842) Weight:  [79.4 kg (175 lb 0.7  oz)-81.7 kg (180 lb 1.9 oz)] 80.1 kg (176 lb 9.4 oz) (07/04 0715)  PHYSICAL EXAMINATION:  EXAM  General Appearance:    Looks criticall ill. Frail  Head:    Normocephalic, without obvious abnormality, atraumatic  Eyes:    PERRL - yesr, conjunctiva/corneas - clear      Ears:    Normal external ear canals, both ears  Nose:   NG tube - no  Throat:  ETT TUBE - yes , OG tube - yes  Neck:   Supple,  No enlargement/tenderness/nodules     Lungs:     Clear to auscultation bilaterally, Ventilator   Synchrony - yes  Chest wall:    No deformity  Heart:    S1 and S2 normal, no murmur, CVP - no.  Pressors - no  Abdomen:     Soft, no masses, no organomegaly  Genitalia:    Not done  Rectal:   not done  Extremities:   Extremities- frail,      Skin:   Intact in exposed areas . Sacral area - stage 2 sacral decub at admit     Neurologic:   Sedation - fent gtt -> RASS - 0 to +1 . Moves all 4s - yes (weakest RLE). CAM-ICU - +ve for delrium . Orientation - partially oriented       LABS  PULMONARY  Recent Labs Lab 03/18/17 1750 03/18/17 2103  PHART 7.512* 7.471*  PCO2ART 32.1 36.8  PO2ART 202* 197.0*  HCO3 25.5 26.7  TCO2  --  28  O2SAT 99.5 100.0    CBC  Recent Labs Lab 03/17/17 1257 03/18/17 0245 03/19/17 0315  HGB 8.1* 7.8* 7.3*  HCT 25.5* 24.2* 23.0*  WBC 10.2 6.5 16.2*  PLT 184 198 192    COAGULATION  Recent Labs Lab 03/17/17 1304 03/18/17 0245 03/18/17 1251 03/19/17 0315  INR 5.90* 6.78* 2.64 1.79    CARDIAC  No results for input(s): TROPONINI in the last 168 hours. No results for input(s): PROBNP in the last 168 hours.   CHEMISTRY  Recent Labs Lab 03/17/17 1257 03/17/17 1817 03/18/17 0245 03/19/17 0315  NA 133*  --  132* 132*  K 3.3*  --  2.9* 4.6  CL 93*  --  95* 96*  CO2 28  --  26 24  GLUCOSE 147*  --  109* 211*  BUN 54*  --  60* 23*  CREATININE 9.23*  --  10.09* 5.45*  CALCIUM 8.9  --  8.5* 7.6*  MG  --  2.2  --  1.8  PHOS  --  3.5  --   4.6   Estimated Creatinine Clearance: 11.5 mL/min (A) (by C-G formula based on SCr of 5.45 mg/dL (H)).   LIVER  Recent Labs Lab 03/17/17 1257 03/17/17 1304 03/18/17 0245 03/18/17 1251 03/19/17 0315  AST 17  --  12*  --   --   ALT 15*  --  14*  --   --   ALKPHOS 34*  --  33*  --   --   BILITOT 0.7  --  0.7  --   --   PROT 5.5*  --  5.9*  --   --   ALBUMIN 3.0*  --  2.7*  --   --   INR  --  5.90* 6.78* 2.64 1.79     INFECTIOUS  Recent Labs Lab 03/18/17 1314 03/18/17 1618 03/19/17 0315  LATICACIDVEN 1.1 0.6  --   PROCALCITON  --   --  >150.00     ENDOCRINE CBG (last 3)   Recent Labs  03/18/17 1620 03/18/17 2218 03/19/17 0849  GLUCAP 101* 147* 126*      Recent Results (from the past 240 hour(s))  Culture, blood (routine x 2)     Status: None (Preliminary result)   Collection Time: 03/18/17 10:17 AM  Result Value Ref Range Status   Specimen Description BLOOD LEFT ARM  Final   Special Requests IN PEDIATRIC BOTTLE Blood Culture adequate volume  Final   Culture  Setup Time   Final    PED GRAM NEGATIVE RODS Organism ID to follow CRITICAL RESULT CALLED TO, READ BACK BY AND VERIFIED WITH: TO KCOOK(PHARMd) BY TCLEVELAND 03/19/2017 AT 1:25AM    Culture PENDING  Incomplete   Report Status PENDING  Incomplete  Blood Culture ID Panel (Reflexed)     Status: Abnormal   Collection Time: 03/18/17 10:17 AM  Result Value Ref Range Status   Enterococcus species NOT DETECTED NOT DETECTED Final   Listeria monocytogenes NOT DETECTED NOT DETECTED Final   Staphylococcus species NOT DETECTED NOT DETECTED Final   Staphylococcus aureus NOT DETECTED NOT DETECTED Final   Streptococcus species NOT DETECTED NOT DETECTED Final   Streptococcus agalactiae NOT DETECTED NOT DETECTED Final   Streptococcus pneumoniae NOT DETECTED NOT DETECTED Final   Streptococcus pyogenes NOT DETECTED NOT  DETECTED Final   Acinetobacter baumannii NOT DETECTED NOT DETECTED Final   Enterobacteriaceae  species DETECTED (A) NOT DETECTED Final    Comment: Enterobacteriaceae represent a large family of gram-negative bacteria, not a single organism. CRITICAL RESULT CALLED TO, READ BACK BY AND VERIFIED WITH: TO KCOOK(PHARMd) BY TCLEVELAND 03/19/2017 AT 1:25AM    Enterobacter cloacae complex NOT DETECTED NOT DETECTED Final   Escherichia coli DETECTED (A) NOT DETECTED Final    Comment: CRITICAL RESULT CALLED TO, READ BACK BY AND VERIFIED WITH: TO KCOOK(PHARMd) BY TCLEVELAND 03/19/2017 AT 1:25AM    Klebsiella oxytoca NOT DETECTED NOT DETECTED Final   Klebsiella pneumoniae NOT DETECTED NOT DETECTED Final   Proteus species NOT DETECTED NOT DETECTED Final   Serratia marcescens NOT DETECTED NOT DETECTED Final   Carbapenem resistance NOT DETECTED NOT DETECTED Final   Haemophilus influenzae NOT DETECTED NOT DETECTED Final   Neisseria meningitidis NOT DETECTED NOT DETECTED Final   Pseudomonas aeruginosa NOT DETECTED NOT DETECTED Final   Candida albicans NOT DETECTED NOT DETECTED Final   Candida glabrata NOT DETECTED NOT DETECTED Final   Candida krusei NOT DETECTED NOT DETECTED Final   Candida parapsilosis NOT DETECTED NOT DETECTED Final   Candida tropicalis NOT DETECTED NOT DETECTED Final  MRSA PCR Screening     Status: None   Collection Time: 03/19/17  2:18 AM  Result Value Ref Range Status   MRSA by PCR NEGATIVE NEGATIVE Final    Comment:        The GeneXpert MRSA Assay (FDA approved for NASAL specimens only), is one component of a comprehensive MRSA colonization surveillance program. It is not intended to diagnose MRSA infection nor to guide or monitor treatment for MRSA infections.       IMAGING x48h  - image(s) personally visualized  -   highlighted in bold Dg Chest 2 View  Result Date: 03/17/2017 CLINICAL DATA:  Generalize weakness for the past 3 days. History of atrial fibrillation, diabetes, end-stage renal disease. EXAM: CHEST  2 VIEW COMPARISON:  Chest x-ray of February 19, 2017  FINDINGS: The lungs are adequately inflated and clear. The cardiac silhouette is mildly enlarged. The pulmonary vascularity is normal. The mediastinum is normal in width. The ICD is in stable position. The trachea is midline. The bony thorax exhibits no acute abnormality. There surgical clips in the left axillary region. IMPRESSION: There is no pneumonia, CHF, nor other acute cardiopulmonary abnormality. Electronically Signed   By: David  Martinique M.D.   On: 03/17/2017 13:41   Ct Cervical Spine W Contrast  Result Date: 03/18/2017 CLINICAL DATA:  81 year old male dialysis patient with worsening generalized weakness. Recently diagnosed with myasthenia gravis. Hyper reflexia. Fever. EXAM: CT CERVICAL SPINE WITH CONTRAST CT THORACIC SPINE WITH CONTRAST TECHNIQUE: Multidetector CT imaging of the cervical and thoracic spine was performed with intravenous contrast. Multiplanar CT image reconstructions were also generated. CONTRAST:  189mL ISOVUE-300 IOPAMIDOL (ISOVUE-300) INJECTION 61% COMPARISON:  Lumbar spine CT 03/03/2017. CT cervical spine 03/03/2017, and earlier. FINDINGS: CT CERVICAL SPINE FINDINGS Alignment: Stable cervical lordosis. Cervicothoracic junction alignment is within normal limits. Bilateral posterior element alignment is within normal limits. Skull base and vertebrae: Visualized skull base is intact. No atlanto-occipital dissociation. No acute osseous abnormality identified. Prior C5-C6 ACDF. Soft tissues and spinal canal: Negative visualized posterior fossa. Major vascular structures in the neck and at the skullbase appear patent. Neck soft tissues are stable compared to the CTA head and neck 02/04/2017, including right tracheoesophageal groove soft tissue nodule. Disc levels: C2-C3: Mild  to moderate facet hypertrophy greater on the right. Borderline to mild spinal stenosis. Mild right C3 foraminal stenosis. C3-C4: Mild to moderate facet hypertrophy greater on the right. Ligament flavum hypertrophy  suspected (series 11, image 39). Up to mild spinal stenosis. Borderline to mild right C4 foraminal stenosis. C4-C5: Disc space loss but mostly anterior endplate spurring. Mild facet hypertrophy. Ligament flavum hypertrophy suspected. No definite spinal stenosis. Borderline to mild left C5 foraminal stenosis. C5-C6:  Prior ACDF.  Solid arthrodesis.  No hardware loosening. C6-C7: Circumferential disc osteophyte complex with broad-based posterior component. Suspect mild to moderate spinal stenosis. Moderate to severe bilateral C7 foraminal stenosis in part due to endplate spurring. C7-T1: Right eccentric disc osteophyte complex. Mild to moderate facet hypertrophy greater on the left. Endplate spurring greater on the right. No definite spinal stenosis. Mild right C8 foraminal stenosis. CT THORACIC SPINE FINDINGS Segmentation:  Normal. Alignment: Exaggerated upper thoracic kyphosis.  No scoliosis. Vertebrae: Flowing osteophytes throughout the thoracic spine from T2 to the thoracolumbar junction. There is superimposed posterior element ankylosis beginning at T6-T7, and a extending to T10-T11. No acute osseous abnormality identified. Paraspinal and other soft tissues: Left chest cardiac pacemaker. Major airways are patent. Small layering left pleural effusion. Dependent lower lobe opacity most resembles atelectasis. Mild thyroid goiter. No mediastinal lymphadenopathy. Calcified aortic atherosclerosis. Partial atrophy of the visible kidneys greater on the right. Negative visualized posterior paraspinal soft tissues. Disc levels: T1-T2: Partially calcified central disc protrusion. Mild to moderate spinal stenosis suspected (series 17, image 42). T2-T3: Ankylosis begins at this level via anterior endplate osteophytes. T3-T4: Ankylosis via endplate osteophytes. Mild endplate spurring. No definite spinal stenosis. T4-T5: Ankylosis via endplate osteophytes. Superimposed small calcified disc bulging. Borderline to mild spinal  stenosis (series 17, image 37). T5-T6: Ankylosis via anterior endplate osteophytes.  No stenosis. T6-T7: Anterior and posterior element ankylosis. No spinal stenosis. Facet hypertrophy such that there is some osseous foraminal stenosis greater on the left. T7-T8: Ankylosis. Mild epidural lipomatosis. Borderline to mild spinal stenosis (series 17, image 34). T8-T9: Ankylosis with mild to moderate facet and ligament flavum hypertrophy with calcified ligament flavum. Mild endplate spurring. Mild to moderate spinal stenosis suspected (series 12, image 103 and series 17, image 34). T9-T10: Ankylosis. Mild to moderate posterior element hypertrophy. Mostly far lateral endplate spurring. Borderline to mild spinal stenosis. T10-T11: Ankylosis. Severe facet hypertrophy greater on the right. No definite spinal stenosis but severe bilateral T10 foraminal stenosis. T11-T12: Interbody ankylosis.  No stenosis. T12-L1:  Interbody ankylosis.  No spinal stenosis. IMPRESSION: 1. Prior cervical ACDF at C5-C6 with solid arthrodesis. 2. Adjacent segment disease at C6-C7 with mild to moderate suspected degenerative cervical spinal stenosis. 3. Mild if any additional cervical spinal stenosis elsewhere, such as C2-C3. 4. Widespread thoracic spine ankylosis from T2 inferiorly. This is primarily due to flowing endplate osteophytes, but there is superimposed posterior element ankylosis from T6-T7 to T10-T11. 5. Mild to moderate degenerative thoracic spinal stenosis suspected at T1-T2 and T8-T9. 6. Mild if any additional thoracic spinal stenosis, such as at T4-T5 and T9-T10. 7. Severe thoracic facet degeneration at T10-T11 with severe T10 neural foraminal stenosis. 8.  No acute osseous abnormality identified. 9. Small layering left pleural effusion. Bilateral lower lobe atelectasis. Electronically Signed   By: Genevie Ann M.D.   On: 03/18/2017 20:03   Ct Thoracic Spine W Contrast  Result Date: 03/18/2017 CLINICAL DATA:  81 year old male dialysis  patient with worsening generalized weakness. Recently diagnosed with myasthenia gravis. Hyper reflexia. Fever. EXAM: CT  CERVICAL SPINE WITH CONTRAST CT THORACIC SPINE WITH CONTRAST TECHNIQUE: Multidetector CT imaging of the cervical and thoracic spine was performed with intravenous contrast. Multiplanar CT image reconstructions were also generated. CONTRAST:  160mL ISOVUE-300 IOPAMIDOL (ISOVUE-300) INJECTION 61% COMPARISON:  Lumbar spine CT 03/03/2017. CT cervical spine 03/03/2017, and earlier. FINDINGS: CT CERVICAL SPINE FINDINGS Alignment: Stable cervical lordosis. Cervicothoracic junction alignment is within normal limits. Bilateral posterior element alignment is within normal limits. Skull base and vertebrae: Visualized skull base is intact. No atlanto-occipital dissociation. No acute osseous abnormality identified. Prior C5-C6 ACDF. Soft tissues and spinal canal: Negative visualized posterior fossa. Major vascular structures in the neck and at the skullbase appear patent. Neck soft tissues are stable compared to the CTA head and neck 02/04/2017, including right tracheoesophageal groove soft tissue nodule. Disc levels: C2-C3: Mild to moderate facet hypertrophy greater on the right. Borderline to mild spinal stenosis. Mild right C3 foraminal stenosis. C3-C4: Mild to moderate facet hypertrophy greater on the right. Ligament flavum hypertrophy suspected (series 11, image 39). Up to mild spinal stenosis. Borderline to mild right C4 foraminal stenosis. C4-C5: Disc space loss but mostly anterior endplate spurring. Mild facet hypertrophy. Ligament flavum hypertrophy suspected. No definite spinal stenosis. Borderline to mild left C5 foraminal stenosis. C5-C6:  Prior ACDF.  Solid arthrodesis.  No hardware loosening. C6-C7: Circumferential disc osteophyte complex with broad-based posterior component. Suspect mild to moderate spinal stenosis. Moderate to severe bilateral C7 foraminal stenosis in part due to endplate  spurring. C7-T1: Right eccentric disc osteophyte complex. Mild to moderate facet hypertrophy greater on the left. Endplate spurring greater on the right. No definite spinal stenosis. Mild right C8 foraminal stenosis. CT THORACIC SPINE FINDINGS Segmentation:  Normal. Alignment: Exaggerated upper thoracic kyphosis.  No scoliosis. Vertebrae: Flowing osteophytes throughout the thoracic spine from T2 to the thoracolumbar junction. There is superimposed posterior element ankylosis beginning at T6-T7, and a extending to T10-T11. No acute osseous abnormality identified. Paraspinal and other soft tissues: Left chest cardiac pacemaker. Major airways are patent. Small layering left pleural effusion. Dependent lower lobe opacity most resembles atelectasis. Mild thyroid goiter. No mediastinal lymphadenopathy. Calcified aortic atherosclerosis. Partial atrophy of the visible kidneys greater on the right. Negative visualized posterior paraspinal soft tissues. Disc levels: T1-T2: Partially calcified central disc protrusion. Mild to moderate spinal stenosis suspected (series 17, image 42). T2-T3: Ankylosis begins at this level via anterior endplate osteophytes. T3-T4: Ankylosis via endplate osteophytes. Mild endplate spurring. No definite spinal stenosis. T4-T5: Ankylosis via endplate osteophytes. Superimposed small calcified disc bulging. Borderline to mild spinal stenosis (series 17, image 37). T5-T6: Ankylosis via anterior endplate osteophytes.  No stenosis. T6-T7: Anterior and posterior element ankylosis. No spinal stenosis. Facet hypertrophy such that there is some osseous foraminal stenosis greater on the left. T7-T8: Ankylosis. Mild epidural lipomatosis. Borderline to mild spinal stenosis (series 17, image 34). T8-T9: Ankylosis with mild to moderate facet and ligament flavum hypertrophy with calcified ligament flavum. Mild endplate spurring. Mild to moderate spinal stenosis suspected (series 12, image 103 and series 17, image  34). T9-T10: Ankylosis. Mild to moderate posterior element hypertrophy. Mostly far lateral endplate spurring. Borderline to mild spinal stenosis. T10-T11: Ankylosis. Severe facet hypertrophy greater on the right. No definite spinal stenosis but severe bilateral T10 foraminal stenosis. T11-T12: Interbody ankylosis.  No stenosis. T12-L1:  Interbody ankylosis.  No spinal stenosis. IMPRESSION: 1. Prior cervical ACDF at C5-C6 with solid arthrodesis. 2. Adjacent segment disease at C6-C7 with mild to moderate suspected degenerative cervical spinal stenosis. 3. Mild if  any additional cervical spinal stenosis elsewhere, such as C2-C3. 4. Widespread thoracic spine ankylosis from T2 inferiorly. This is primarily due to flowing endplate osteophytes, but there is superimposed posterior element ankylosis from T6-T7 to T10-T11. 5. Mild to moderate degenerative thoracic spinal stenosis suspected at T1-T2 and T8-T9. 6. Mild if any additional thoracic spinal stenosis, such as at T4-T5 and T9-T10. 7. Severe thoracic facet degeneration at T10-T11 with severe T10 neural foraminal stenosis. 8.  No acute osseous abnormality identified. 9. Small layering left pleural effusion. Bilateral lower lobe atelectasis. Electronically Signed   By: Genevie Ann M.D.   On: 03/18/2017 20:03   Dg Chest Port 1 View  Result Date: 03/19/2017 CLINICAL DATA:  Intubated.  Followup. EXAM: PORTABLE CHEST 1 VIEW COMPARISON:  03/18/2017 FINDINGS: Endotracheal tube tip is 6 cm above the carina. Nasogastric tube is present within the stomach. Pacemaker leads appear unchanged. Right internal jugular central line tip in the SVC 2 cm above the right atrium. Moderate atelectasis persists in the left lower lobe. Mild atelectasis persists in the right lower lobe. Question developing interstitial edema pattern. IMPRESSION: Lines and tubes satisfactory. Persistent lower lobe atelectasis left worse than right. Question developing edema pattern. Electronically Signed   By:  Nelson Chimes M.D.   On: 03/19/2017 07:52   Dg Chest Port 1 View  Result Date: 03/18/2017 CLINICAL DATA:  Central line placement. EXAM: PORTABLE CHEST 1 VIEW COMPARISON:  03/18/2017 and prior studies FINDINGS: A right IJ central venous catheter is noted with tip overlying the lower SVC. There is no evidence of pneumothorax. An endotracheal tube with tip 5.2 cm above the carina, NG tube entering the stomach with tip off the field of view and left-sided pacemaker again noted. Bibasilar atelectasis, left-greater-than-right again noted. Mild pulmonary vascular congestion is present. A right subclavian stent is again identified. IMPRESSION: Right IJ central venous catheter with tip overlying the lower SVC. No evidence of pneumothorax. Support apparatus as described. Bibasilar atelectasis and mild pulmonary vascular congestion. Electronically Signed   By: Margarette Canada M.D.   On: 03/18/2017 22:11   Dg Chest Port 1 View  Result Date: 03/18/2017 CLINICAL DATA:  81 year old male dialysis patient with worsening generalized weakness, recently diagnosed with myasthenia gravis, worsening respiratory difficulty. Intubated. Hyperreflexia. Fever. EXAM: PORTABLE CHEST 1 VIEW COMPARISON:  Chest radiographs 03/17/2017 and earlier. Thoracic spine CT today reported separately. FINDINGS: Portable AP semi upright view at 1938 hours. Endotracheal tube tip projects over the tracheal air column at the level of the clavicles. Enteric tube courses the abdomen, tip not included. Mild veiling opacity at the left lung base. No pneumothorax or pulmonary edema. Stable cardiac size and mediastinal contours. Stable left chest dual lead cardiac pacemaker. Allowing portable technique the right lung is clear right subclavian vascular stent. Surgical clips along the left chest wall. ACDF hardware again noted. IMPRESSION: 1. Endotracheal tube tip in good position. Enteric tube courses to the abdomen. 2. Small left pleural effusion. No other acute  cardiopulmonary abnormality. Electronically Signed   By: Genevie Ann M.D.   On: 03/18/2017 20:11   Dg Abd Portable 1v  Result Date: 03/18/2017 CLINICAL DATA:  81 year old male dialysis patient with worsening generalized weakness, recently diagnosed with myasthenia gravis, worsening respiratory difficulty. Intubated. Oral enteric tube. Hyperreflexia. Fever. EXAM: PORTABLE ABDOMEN - 1 VIEW COMPARISON:  Portable chest at 1938 hours. Thoracic spine CT today reported separately. FINDINGS: Portable AP supine view at 1948 hours. Enteric tube courses to the abdomen and the side hole projects  at the mid gastric body level. Bowel gas pattern is within normal limits. Stable lung bases. No acute osseous abnormality identified. IMPRESSION: Enteric tube side hole up the level of the gastric body. Non-obstructed bowel gas pattern. Electronically Signed   By: Genevie Ann M.D.   On: 03/18/2017 20:13      ASSESSMENT / PLAN:  PULMONARY A: #baseline: not on o2 #current: acute resp failure due to MG crisis and sepsis  03/19/2017 -> too frail to extubae  P:   PRVC VAP bundle SBT as tolerated  CARDIOVASCULAR A:  #baseline: on asa, zetia,  #current  BP soft. Nil acut  03/19/2017 -> BP soft  P:  Neo for MAP > 65 if needed  RENAL  Intake/Output Summary (Last 24 hours) at 03/19/17 0956 Last data filed at 03/19/17 0800  Gross per 24 hour  Intake           955.38 ml  Output             2039 ml  Net         -1083.62 ml    Recent Labs Lab 03/17/17 1257 03/18/17 0245 03/19/17 0315  CREATININE 9.23* 10.09* 5.45*     A:   #baseline: chronic HD ESRD MWF patient #current nil acute  03/19/2017 -> ongoing HD   P:   Per renal  GASTROINTESTINAL A:   In need of TF  P:   Dietary consult  HEMATOLOGIC  Recent Labs Lab 03/17/17 1257 03/18/17 0245 03/19/17 0315  HGB 8.1* 7.8* 7.3*  HCT 25.5* 24.2* 23.0*  WBC 10.2 6.5 16.2*  PLT 184 198 192    A:   #RBC: anemial of chronic and critical  illness #Platelet normal #WBC high wbc  P:  - PRBC for hgb </= 6.9gm%    - exceptions are   -  if ACS susepcted/confirmed then transfuse for hgb </= 8.0gm%,  or    -  active bleeding with hemodynamic instability, then transfuse regardless of hemoglobin value   At at all times try to transfuse 1 unit prbc as possible with exception of active hemorrhage    INFECTIOUS  Recent Labs Lab 03/19/17 0315  PROCALCITON >150.00    Results for orders placed or performed during the hospital encounter of 03/17/17  Culture, blood (routine x 2)     Status: None (Preliminary result)   Collection Time: 03/18/17 10:17 AM  Result Value Ref Range Status   Specimen Description BLOOD LEFT ARM  Final   Special Requests IN PEDIATRIC BOTTLE Blood Culture adequate volume  Final   Culture  Setup Time   Final    PED GRAM NEGATIVE RODS Organism ID to follow CRITICAL RESULT CALLED TO, READ BACK BY AND VERIFIED WITH: TO KCOOK(PHARMd) BY TCLEVELAND 03/19/2017 AT 1:25AM    Culture PENDING  Incomplete   Report Status PENDING  Incomplete  Blood Culture ID Panel (Reflexed)     Status: Abnormal   Collection Time: 03/18/17 10:17 AM  Result Value Ref Range Status   Enterococcus species NOT DETECTED NOT DETECTED Final   Listeria monocytogenes NOT DETECTED NOT DETECTED Final   Staphylococcus species NOT DETECTED NOT DETECTED Final   Staphylococcus aureus NOT DETECTED NOT DETECTED Final   Streptococcus species NOT DETECTED NOT DETECTED Final   Streptococcus agalactiae NOT DETECTED NOT DETECTED Final   Streptococcus pneumoniae NOT DETECTED NOT DETECTED Final   Streptococcus pyogenes NOT DETECTED NOT DETECTED Final   Acinetobacter baumannii NOT DETECTED NOT DETECTED Final   Enterobacteriaceae  species DETECTED (A) NOT DETECTED Final    Comment: Enterobacteriaceae represent a large family of gram-negative bacteria, not a single organism. CRITICAL RESULT CALLED TO, READ BACK BY AND VERIFIED WITH: TO KCOOK(PHARMd) BY  TCLEVELAND 03/19/2017 AT 1:25AM    Enterobacter cloacae complex NOT DETECTED NOT DETECTED Final   Escherichia coli DETECTED (A) NOT DETECTED Final    Comment: CRITICAL RESULT CALLED TO, READ BACK BY AND VERIFIED WITH: TO KCOOK(PHARMd) BY TCLEVELAND 03/19/2017 AT 1:25AM    Klebsiella oxytoca NOT DETECTED NOT DETECTED Final   Klebsiella pneumoniae NOT DETECTED NOT DETECTED Final   Proteus species NOT DETECTED NOT DETECTED Final   Serratia marcescens NOT DETECTED NOT DETECTED Final   Carbapenem resistance NOT DETECTED NOT DETECTED Final   Haemophilus influenzae NOT DETECTED NOT DETECTED Final   Neisseria meningitidis NOT DETECTED NOT DETECTED Final   Pseudomonas aeruginosa NOT DETECTED NOT DETECTED Final   Candida albicans NOT DETECTED NOT DETECTED Final   Candida glabrata NOT DETECTED NOT DETECTED Final   Candida krusei NOT DETECTED NOT DETECTED Final   Candida parapsilosis NOT DETECTED NOT DETECTED Final   Candida tropicalis NOT DETECTED NOT DETECTED Final  MRSA PCR Screening     Status: None   Collection Time: 03/19/17  2:18 AM  Result Value Ref Range Status   MRSA by PCR NEGATIVE NEGATIVE Final    Comment:        The GeneXpert MRSA Assay (FDA approved for NASAL specimens only), is one component of a comprehensive MRSA colonization surveillance program. It is not intended to diagnose MRSA infection nor to guide or monitor treatment for MRSA infections.     A:   Severe sepsis with GNR bacteremia P:   Dc vanc Continue zosyn and then narrow Anti-infectives    Start     Dose/Rate Route Frequency Ordered Stop   03/19/17 1200  vancomycin (VANCOCIN) IVPB 750 mg/150 ml premix  Status:  Discontinued     750 mg 150 mL/hr over 60 Minutes Intravenous Every M-W-F (Hemodialysis) 03/18/17 1312 03/19/17 0146   03/18/17 1600  vancomycin (VANCOCIN) 1,750 mg in sodium chloride 0.9 % 250 mL IVPB     1,750 mg 250 mL/hr over 60 Minutes Intravenous Every Tue (Hemodialysis) 03/18/17 1312  03/18/17 1830   03/18/17 1400  piperacillin-tazobactam (ZOSYN) IVPB 3.375 g     3.375 g 12.5 mL/hr over 240 Minutes Intravenous Every 12 hours 03/18/17 1312         ENDOCRINE A:   On AC/HS   P:   ICU hyperglycemia protocol; !4h  NEUROLOGIC A:   #Baseline : MG diagnosis  #Current: MG flare ? RLE weakness   03/19/2017 -> per neuro P:   RASS goal: 0 fent gtt Versed prn Get CT head per neuro   FAMILY  - Updates: 03/19/2017 --> no family at bedside  - Inter-disciplinary family meet or Palliative Care meeting due by:  DAy 7. Current LOS is LOS 2 days   DISPO Keep in ICU   The patient is critically ill with multiple organ systems failure and requires high complexity decision making for assessment and support, frequent evaluation and titration of therapies, application of advanced monitoring technologies and extensive interpretation of multiple databases.   Critical Care Time devoted to patient care services described in this note is  30  Minutes. This time reflects time of care of this signee Dr Brand Males. This critical care time does not reflect procedure time, or teaching time or supervisory time of PA/NP/Med student/Med  Resident etc but could involve care discussion time    Dr. Brand Males, M.D., [facility].C.P Pulmonary and Critical Care Medicine Staff Physician [facility] Pager: [phone], If no answer or between  15:00h - 7:00h: call [phone]  03/19/2017 9:56 AM

## 2017-03-19 NOTE — Progress Notes (Signed)
Subjective: More awake than yesterday  Exam: Vitals:   03/19/17 1100 03/19/17 1115  BP: (!) 121/52 (!) 107/54  Pulse: 86 86  Resp: 16 14  Temp:     Gen: In bed, NAD Resp: non-labored breathing, no acute distress Abd: soft, nt  Neuro: MS: awake, alert, follows commands CN: PERRL, does not blink to threat from either side, left gaze preference.  Motor: moves left side more than right,. But moves the right with at least 4/5 strenght.  Sensory:responds to nox stim x 4.   Pertinent Labs: BMP - elevated creatinine.   Impression: 81 year old male with subjective progressive generalized weakness in the setting of recent diagnosis of myasthenia gravis. His hyperreflexia could be a sequela of his previous cervical injury, though current myelopathy would also be a consideration. His dysphagia would argue more for myasthenia as a cause of his current symptoms. CT spine is negative and his leg strength is improved today, arguing against a   Today, he has new focal deficits, concern for possible stroke, unfortunately, he cannot get MRI due to pacemaker  Recommendations: 1) CT head, CT perfusion, angio head and neck.  2) Continue IVIG  3) appreciate CCM assistence 4) EEG  Roland Rack, MD [facility] [phone]  If 7pm- 7am, please page neurology on call as listed in [place].

## 2017-03-19 NOTE — Progress Notes (Signed)
[facility] Progress Note   Dialysis Orders: [facility] MWF - EDW 80 - getting slightly below at times 2 K 2.25 Ca 400/800 right upper AVF no heparin James David 4 venofer 50 6/25 mircera 75 6/20 - just ^ to 100 for next dose Recent labs: hgb 8.9 INR ok 25% sat ferritin 135 May ipTH 291  Assessment/Plan: 1. Acute respiratory distress/failure -  HD goal 2 - 2.5 L Tuesday with net UF 1.3 7/3; became progressively weaker with respiratory distress; CXR did not explain the degree of respiratory distress. CT showed bilateral LL atx and layering smal L pleural effusion; electively intubated last evening with improvement.  2. E Coli urosepsis (+ BC - GNR urine >100K colonies - Vanc stopped and Zosyn continued); WBC up to 16 K today Procalcitonin > 150 3. ESRD -  MWF via AVF - last OP HD Friday 6/29 - HD 7/3 and again today to get back on schedule- dialyzing as a separate 4. Hypertension/volume  - BP stable on HD goal 2 L today sats ok - getting all volume off;  Coreg changed to IV lopressor 5. Anemia  - Fe def anemia heme + dose ESA today - Aranesp 100 - hold on Fe for now given bacteremia; transfuse prn hgb 7.8 7/3 down to 7.3.  RN consulted with pulmonary who said not to transfuse unless 6.9.  Follow CBC daily - can transfuse Friday on HD or sooner if needed. 6. Metabolic bone disease -   James David IV with HD 7. Nutrition - NPO - TF to be started 8. Supra therapeutic INR - no heparin HD - vit K given-  INR elevated on admission - down to 1.79  9. Myasthenia gravis - IVIG per neuro 10. DM - per primary  James Jacobson, PA-C [place] [phone] 03/19/2017,10:57 AM  LOS: 2 days    Patient seen and examined, agree with above note with above modifications. Seen on HD- got more restless toward the end of HD- maybe due to fentanyl being cleared.  Able to remove 1.5 liters - tolerated well.  I agree I dont think volume is a big component here.  Positive blood cultures on  appropriate abx- kind of elective intubation [name], MD 03/19/2017     Subjective:   Restless; nursing reports there is some right sided neglect and head CT is ordered.  Objective Vitals:   03/19/17 1000 03/19/17 1015 03/19/17 1030 03/19/17 1045  BP: 115/60 (!) 113/94 117/67 (!) 106/51  Pulse: 85 86 (!) 103 100  Resp: 15 12 18 11   Temp:      TempSrc:      SpO2: 100% 100% 100% 100%  Weight:      Height:       Physical Exam General: NAD intubated eyes open Heart: RRR Lungs: grossly clear anteriorly Abdomen: soft + BS Extremities: no LE edema Dialysis Access:  Right AVF QB 350 decreased due to arm movements - mittens on hands  Additional Objective Labs: Lab Results  Component Value Date   INR 1.79 03/19/2017   INR 2.64 03/18/2017   INR 6.78 (HH) 95/05/3266    Basic Metabolic Panel:  Recent Labs Lab 03/17/17 1257 03/17/17 1817 03/18/17 0245 03/19/17 0315  NA 133*  --  132* 132*  K 3.3*  --  2.9* 4.6  CL 93*  --  95* 96*  CO2 28  --  26 24  GLUCOSE 147*  --  109* 211*  BUN 54*  --  60* 23*  CREATININE 9.23*  --  10.09* 5.45*  CALCIUM 8.9  --  8.5* 7.6*  PHOS  --  3.5  --  4.6   Liver Function Tests:  Recent Labs Lab 03/17/17 1257 03/18/17 0245  AST 17 12*  ALT 15* 14*  ALKPHOS 34* 33*  BILITOT 0.7 0.7  PROT 5.5* 5.9*  ALBUMIN 3.0* 2.7*   CBC:  Recent Labs Lab 03/17/17 1257 03/18/17 0245 03/19/17 0315  WBC 10.2 6.5 16.2*  NEUTROABS 8.8*  --   --   HGB 8.1* 7.8* 7.3*  HCT 25.5* 24.2* 23.0*  MCV 96.2 95.3 97.5  PLT 184 198 192   Blood Culture    Component Value Date/Time   SDES BLOOD LEFT ARM 03/18/2017 1017   SDES BLOOD LEFT HAND 03/18/2017 1017   SPECREQUEST IN PEDIATRIC BOTTLE Blood Culture adequate volume 03/18/2017 1017   SPECREQUEST IN PEDIATRIC BOTTLE Blood Culture adequate volume 03/18/2017 1017   CULT NO GROWTH < 24 HOURS 03/18/2017 1017   CULT NO GROWTH < 24 HOURS 03/18/2017 1017   REPTSTATUS PENDING 03/18/2017  1017   REPTSTATUS PENDING 03/18/2017 1017   CBG:  Recent Labs Lab 03/18/17 1012 03/18/17 1127 03/18/17 1620 03/18/17 2218 03/19/17 0849  GLUCAP 107* 120* 101* 147* 126*   Iron Studies:  Recent Labs  03/18/17 1017  IRON 26*  TIBC 211*  FERRITIN 457*   Lab Results  Component Value Date   INR 1.79 03/19/2017   INR 2.64 03/18/2017   INR 6.78 (HH) 03/18/2017   Studies/Results: Dg Chest 2 View  Result Date: 03/17/2017 CLINICAL DATA:  Generalize weakness for the past 3 days. History of atrial fibrillation, diabetes, end-stage renal disease. EXAM: CHEST  2 VIEW COMPARISON:  Chest x-ray of February 19, 2017 FINDINGS: The lungs are adequately inflated and clear. The cardiac silhouette is mildly enlarged. The pulmonary vascularity is normal. The mediastinum is normal in width. The ICD is in stable position. The trachea is midline. The bony thorax exhibits no acute abnormality. There surgical clips in the left axillary region. IMPRESSION: There is no pneumonia, CHF, nor other acute cardiopulmonary abnormality. Electronically Signed   By: David  Martinique M.D.   On: 03/17/2017 13:41   Ct Cervical Spine W Contrast  Result Date: 03/18/2017 CLINICAL DATA:  81 year old male dialysis patient with worsening generalized weakness. Recently diagnosed with myasthenia gravis. Hyper reflexia. Fever. EXAM: CT CERVICAL SPINE WITH CONTRAST CT THORACIC SPINE WITH CONTRAST TECHNIQUE: Multidetector CT imaging of the cervical and thoracic spine was performed with intravenous contrast. Multiplanar CT image reconstructions were also generated. CONTRAST:  11m ISOVUE-300 IOPAMIDOL (ISOVUE-300) INJECTION 61% COMPARISON:  Lumbar spine CT 03/03/2017. CT cervical spine 03/03/2017, and earlier. FINDINGS: CT CERVICAL SPINE FINDINGS Alignment: Stable cervical lordosis. Cervicothoracic junction alignment is within normal limits. Bilateral posterior element alignment is within normal limits. Skull base and vertebrae: Visualized skull  base is intact. No atlanto-occipital dissociation. No acute osseous abnormality identified. Prior C5-C6 ACDF. Soft tissues and spinal canal: Negative visualized posterior fossa. Major vascular structures in the neck and at the skullbase appear patent. Neck soft tissues are stable compared to the CTA head and neck 02/04/2017, including right tracheoesophageal groove soft tissue nodule. Disc levels: C2-C3: Mild to moderate facet hypertrophy greater on the right. Borderline to mild spinal stenosis. Mild right C3 foraminal stenosis. C3-C4: Mild to moderate facet hypertrophy greater on the right. Ligament flavum hypertrophy suspected (series 11, image 39). Up to mild spinal stenosis. Borderline to mild right C4 foraminal stenosis.  C4-C5: Disc space loss but mostly anterior endplate spurring. Mild facet hypertrophy. Ligament flavum hypertrophy suspected. No definite spinal stenosis. Borderline to mild left C5 foraminal stenosis. C5-C6:  Prior ACDF.  Solid arthrodesis.  No hardware loosening. C6-C7: Circumferential disc osteophyte complex with broad-based posterior component. Suspect mild to moderate spinal stenosis. Moderate to severe bilateral C7 foraminal stenosis in part due to endplate spurring. C7-T1: Right eccentric disc osteophyte complex. Mild to moderate facet hypertrophy greater on the left. Endplate spurring greater on the right. No definite spinal stenosis. Mild right C8 foraminal stenosis. CT THORACIC SPINE FINDINGS Segmentation:  Normal. Alignment: Exaggerated upper thoracic kyphosis.  No scoliosis. Vertebrae: Flowing osteophytes throughout the thoracic spine from T2 to the thoracolumbar junction. There is superimposed posterior element ankylosis beginning at T6-T7, and a extending to T10-T11. No acute osseous abnormality identified. Paraspinal and other soft tissues: Left chest cardiac pacemaker. Major airways are patent. Small layering left pleural effusion. Dependent lower lobe opacity most resembles  atelectasis. Mild thyroid goiter. No mediastinal lymphadenopathy. Calcified aortic atherosclerosis. Partial atrophy of the visible kidneys greater on the right. Negative visualized posterior paraspinal soft tissues. Disc levels: T1-T2: Partially calcified central disc protrusion. Mild to moderate spinal stenosis suspected (series 17, image 42). T2-T3: Ankylosis begins at this level via anterior endplate osteophytes. T3-T4: Ankylosis via endplate osteophytes. Mild endplate spurring. No definite spinal stenosis. T4-T5: Ankylosis via endplate osteophytes. Superimposed small calcified disc bulging. Borderline to mild spinal stenosis (series 17, image 37). T5-T6: Ankylosis via anterior endplate osteophytes.  No stenosis. T6-T7: Anterior and posterior element ankylosis. No spinal stenosis. Facet hypertrophy such that there is some osseous foraminal stenosis greater on the left. T7-T8: Ankylosis. Mild epidural lipomatosis. Borderline to mild spinal stenosis (series 17, image 34). T8-T9: Ankylosis with mild to moderate facet and ligament flavum hypertrophy with calcified ligament flavum. Mild endplate spurring. Mild to moderate spinal stenosis suspected (series 12, image 103 and series 17, image 34). T9-T10: Ankylosis. Mild to moderate posterior element hypertrophy. Mostly far lateral endplate spurring. Borderline to mild spinal stenosis. T10-T11: Ankylosis. Severe facet hypertrophy greater on the right. No definite spinal stenosis but severe bilateral T10 foraminal stenosis. T11-T12: Interbody ankylosis.  No stenosis. T12-L1:  Interbody ankylosis.  No spinal stenosis. IMPRESSION: 1. Prior cervical ACDF at C5-C6 with solid arthrodesis. 2. Adjacent segment disease at C6-C7 with mild to moderate suspected degenerative cervical spinal stenosis. 3. Mild if any additional cervical spinal stenosis elsewhere, such as C2-C3. 4. Widespread thoracic spine ankylosis from T2 inferiorly. This is primarily due to flowing endplate  osteophytes, but there is superimposed posterior element ankylosis from T6-T7 to T10-T11. 5. Mild to moderate degenerative thoracic spinal stenosis suspected at T1-T2 and T8-T9. 6. Mild if any additional thoracic spinal stenosis, such as at T4-T5 and T9-T10. 7. Severe thoracic facet degeneration at T10-T11 with severe T10 neural foraminal stenosis. 8.  No acute osseous abnormality identified. 9. Small layering left pleural effusion. Bilateral lower lobe atelectasis. Electronically Signed   By: Genevie Ann M.D.   On: 03/18/2017 20:03   Ct Thoracic Spine W Contrast  Result Date: 03/18/2017 CLINICAL DATA:  81 year old male dialysis patient with worsening generalized weakness. Recently diagnosed with myasthenia gravis. Hyper reflexia. Fever. EXAM: CT CERVICAL SPINE WITH CONTRAST CT THORACIC SPINE WITH CONTRAST TECHNIQUE: Multidetector CT imaging of the cervical and thoracic spine was performed with intravenous contrast. Multiplanar CT image reconstructions were also generated. CONTRAST:  159m ISOVUE-300 IOPAMIDOL (ISOVUE-300) INJECTION 61% COMPARISON:  Lumbar spine CT 03/03/2017. CT cervical spine 03/03/2017,  and earlier. FINDINGS: CT CERVICAL SPINE FINDINGS Alignment: Stable cervical lordosis. Cervicothoracic junction alignment is within normal limits. Bilateral posterior element alignment is within normal limits. Skull base and vertebrae: Visualized skull base is intact. No atlanto-occipital dissociation. No acute osseous abnormality identified. Prior C5-C6 ACDF. Soft tissues and spinal canal: Negative visualized posterior fossa. Major vascular structures in the neck and at the skullbase appear patent. Neck soft tissues are stable compared to the CTA head and neck 02/04/2017, including right tracheoesophageal groove soft tissue nodule. Disc levels: C2-C3: Mild to moderate facet hypertrophy greater on the right. Borderline to mild spinal stenosis. Mild right C3 foraminal stenosis. C3-C4: Mild to moderate facet  hypertrophy greater on the right. Ligament flavum hypertrophy suspected (series 11, image 39). Up to mild spinal stenosis. Borderline to mild right C4 foraminal stenosis. C4-C5: Disc space loss but mostly anterior endplate spurring. Mild facet hypertrophy. Ligament flavum hypertrophy suspected. No definite spinal stenosis. Borderline to mild left C5 foraminal stenosis. C5-C6:  Prior ACDF.  Solid arthrodesis.  No hardware loosening. C6-C7: Circumferential disc osteophyte complex with broad-based posterior component. Suspect mild to moderate spinal stenosis. Moderate to severe bilateral C7 foraminal stenosis in part due to endplate spurring. C7-T1: Right eccentric disc osteophyte complex. Mild to moderate facet hypertrophy greater on the left. Endplate spurring greater on the right. No definite spinal stenosis. Mild right C8 foraminal stenosis. CT THORACIC SPINE FINDINGS Segmentation:  Normal. Alignment: Exaggerated upper thoracic kyphosis.  No scoliosis. Vertebrae: Flowing osteophytes throughout the thoracic spine from T2 to the thoracolumbar junction. There is superimposed posterior element ankylosis beginning at T6-T7, and a extending to T10-T11. No acute osseous abnormality identified. Paraspinal and other soft tissues: Left chest cardiac pacemaker. Major airways are patent. Small layering left pleural effusion. Dependent lower lobe opacity most resembles atelectasis. Mild thyroid goiter. No mediastinal lymphadenopathy. Calcified aortic atherosclerosis. Partial atrophy of the visible kidneys greater on the right. Negative visualized posterior paraspinal soft tissues. Disc levels: T1-T2: Partially calcified central disc protrusion. Mild to moderate spinal stenosis suspected (series 17, image 42). T2-T3: Ankylosis begins at this level via anterior endplate osteophytes. T3-T4: Ankylosis via endplate osteophytes. Mild endplate spurring. No definite spinal stenosis. T4-T5: Ankylosis via endplate osteophytes.  Superimposed small calcified disc bulging. Borderline to mild spinal stenosis (series 17, image 37). T5-T6: Ankylosis via anterior endplate osteophytes.  No stenosis. T6-T7: Anterior and posterior element ankylosis. No spinal stenosis. Facet hypertrophy such that there is some osseous foraminal stenosis greater on the left. T7-T8: Ankylosis. Mild epidural lipomatosis. Borderline to mild spinal stenosis (series 17, image 34). T8-T9: Ankylosis with mild to moderate facet and ligament flavum hypertrophy with calcified ligament flavum. Mild endplate spurring. Mild to moderate spinal stenosis suspected (series 12, image 103 and series 17, image 34). T9-T10: Ankylosis. Mild to moderate posterior element hypertrophy. Mostly far lateral endplate spurring. Borderline to mild spinal stenosis. T10-T11: Ankylosis. Severe facet hypertrophy greater on the right. No definite spinal stenosis but severe bilateral T10 foraminal stenosis. T11-T12: Interbody ankylosis.  No stenosis. T12-L1:  Interbody ankylosis.  No spinal stenosis. IMPRESSION: 1. Prior cervical ACDF at C5-C6 with solid arthrodesis. 2. Adjacent segment disease at C6-C7 with mild to moderate suspected degenerative cervical spinal stenosis. 3. Mild if any additional cervical spinal stenosis elsewhere, such as C2-C3. 4. Widespread thoracic spine ankylosis from T2 inferiorly. This is primarily due to flowing endplate osteophytes, but there is superimposed posterior element ankylosis from T6-T7 to T10-T11. 5. Mild to moderate degenerative thoracic spinal stenosis suspected at T1-T2 and T8-T9.  6. Mild if any additional thoracic spinal stenosis, such as at T4-T5 and T9-T10. 7. Severe thoracic facet degeneration at T10-T11 with severe T10 neural foraminal stenosis. 8.  No acute osseous abnormality identified. 9. Small layering left pleural effusion. Bilateral lower lobe atelectasis. Electronically Signed   By: Genevie Ann M.D.   On: 03/18/2017 20:03   Dg Chest Port 1  View  Result Date: 03/19/2017 CLINICAL DATA:  Intubated.  Followup. EXAM: PORTABLE CHEST 1 VIEW COMPARISON:  03/18/2017 FINDINGS: Endotracheal tube tip is 6 cm above the carina. Nasogastric tube is present within the stomach. Pacemaker leads appear unchanged. Right internal jugular central line tip in the SVC 2 cm above the right atrium. Moderate atelectasis persists in the left lower lobe. Mild atelectasis persists in the right lower lobe. Question developing interstitial edema pattern. IMPRESSION: Lines and tubes satisfactory. Persistent lower lobe atelectasis left worse than right. Question developing edema pattern. Electronically Signed   By: Nelson Chimes M.D.   On: 03/19/2017 07:52   Dg Chest Port 1 View  Result Date: 03/18/2017 CLINICAL DATA:  Central line placement. EXAM: PORTABLE CHEST 1 VIEW COMPARISON:  03/18/2017 and prior studies FINDINGS: A right IJ central venous catheter is noted with tip overlying the lower SVC. There is no evidence of pneumothorax. An endotracheal tube with tip 5.2 cm above the carina, NG tube entering the stomach with tip off the field of view and left-sided pacemaker again noted. Bibasilar atelectasis, left-greater-than-right again noted. Mild pulmonary vascular congestion is present. A right subclavian stent is again identified. IMPRESSION: Right IJ central venous catheter with tip overlying the lower SVC. No evidence of pneumothorax. Support apparatus as described. Bibasilar atelectasis and mild pulmonary vascular congestion. Electronically Signed   By: Margarette Canada M.D.   On: 03/18/2017 22:11   Dg Chest Port 1 View  Result Date: 03/18/2017 CLINICAL DATA:  81 year old male dialysis patient with worsening generalized weakness, recently diagnosed with myasthenia gravis, worsening respiratory difficulty. Intubated. Hyperreflexia. Fever. EXAM: PORTABLE CHEST 1 VIEW COMPARISON:  Chest radiographs 03/17/2017 and earlier. Thoracic spine CT today reported separately. FINDINGS:  Portable AP semi upright view at 1938 hours. Endotracheal tube tip projects over the tracheal air column at the level of the clavicles. Enteric tube courses the abdomen, tip not included. Mild veiling opacity at the left lung base. No pneumothorax or pulmonary edema. Stable cardiac size and mediastinal contours. Stable left chest dual lead cardiac pacemaker. Allowing portable technique the right lung is clear right subclavian vascular stent. Surgical clips along the left chest wall. ACDF hardware again noted. IMPRESSION: 1. Endotracheal tube tip in good position. Enteric tube courses to the abdomen. 2. Small left pleural effusion. No other acute cardiopulmonary abnormality. Electronically Signed   By: Genevie Ann M.D.   On: 03/18/2017 20:11   Dg Abd Portable 1v  Result Date: 03/18/2017 CLINICAL DATA:  81 year old male dialysis patient with worsening generalized weakness, recently diagnosed with myasthenia gravis, worsening respiratory difficulty. Intubated. Oral enteric tube. Hyperreflexia. Fever. EXAM: PORTABLE ABDOMEN - 1 VIEW COMPARISON:  Portable chest at 1938 hours. Thoracic spine CT today reported separately. FINDINGS: Portable AP supine view at 1948 hours. Enteric tube courses to the abdomen and the side hole projects at the mid gastric body level. Bowel gas pattern is within normal limits. Stable lung bases. No acute osseous abnormality identified. IMPRESSION: Enteric tube side hole up the level of the gastric body. Non-obstructed bowel gas pattern. Electronically Signed   By: Genevie Ann M.D.   On:  03/18/2017 20:13   Medications: . sodium chloride    . sodium chloride    . fentaNYL infusion INTRAVENOUS 40 mcg/hr (03/19/17 0700)  . magnesium sulfate 1 - 4 g bolus IVPB    . phenylephrine (NEO-SYNEPHRINE) Adult infusion    . phytonadione (VITAMIN K) IV    . piperacillin-tazobactam (ZOSYN)  IV Stopped (03/19/17 0551)   . chlorhexidine gluconate (MEDLINE KIT)  15 mL Mouth Rinse BID  . Chlorhexidine  Gluconate Cloth  6 each Topical Daily  . diphenhydrAMINE  50 mg Intravenous Daily  . doxercalciferol  4 mcg Intravenous Q M,W,F-HD  . famotidine  20 mg Oral QHS  . Immune Globulin 10%  400 mg/kg Intravenous Q24 Hr x 5  . insulin aspart  0-9 Units Subcutaneous Q4H  . ipratropium-albuterol  3 mL Nebulization Q6H  . mouth rinse  15 mL Mouth Rinse QID  . methylPREDNISolone (SOLU-MEDROL) injection  80 mg Intravenous Q24H  . metoprolol tartrate  2.5 mg Intravenous Q8H  . sodium chloride flush  10-40 mL Intracatheter Q12H  . sodium chloride flush  3 mL Intravenous Q12H

## 2017-03-19 NOTE — Progress Notes (Signed)
Initial Nutrition Assessment  DOCUMENTATION CODES:   Severe malnutrition in context of chronic illness  INTERVENTION:   Nepro @ 30 ml/hr (720 ml/day) 30 ml Prostat five times per day Provides: 1796 kcal, 133 grams protein, and 523 ml free water.   NUTRITION DIAGNOSIS:   Malnutrition related to chronic illness (ESRD on HD) as evidenced by severe depletion of muscle mass, 7 percent weight loss x 2 months.   GOAL:   Patient will meet greater than or equal to 90% of their needs  MONITOR:   TF tolerance, I & O's, Labs, Vent status  REASON FOR ASSESSMENT:   Consult, Ventilator Enteral/tube feeding initiation and management  ASSESSMENT:   Pt with PMH of ESRD on HD since 01/21/17, DM, HLD, and several recent hospitalizations. Mostly recently d/c'ed from rehab 6/20 after CVA/UTI. Pt newly dx with myasthenia gravis failed Mestinon d/t severe diarrhea. Admitted 7/2 for progressive weakness, MG crisis, and sepsis. Intubated plan for IVIG.    Patient is currently intubated on ventilator support MV: 9.2 L/min Temp (24hrs), Avg:98.9 F (37.2 C), Min:97.6 F (36.4 C), Max:100.8 F (38.2 C)  Medications reviewed and include: solumedrol, vitamin K (once) Labs reviewed: Na 132 (L), K+/PO4/magnesium WNL CBG's: 147-126 Nutrition-Focused physical exam completed. Findings are no fat depletion, severe muscle depletion BLE, and no edema.  Per chart review pt with 7% weight loss over the last 2 months. Per nephrology notes pt's EDW 5/17 was 85.5 kg, pt now down to 80 kg and sometimes lower per MD.  During hospitalization intake was 30-60%, in rehab he ate better,  > 75% of meals. Unknown what pt was eating at home after d/c but was having severe diarrhea due to Rx. No intake x 2 days.   Pt awake on vent, HD just finishing. Spoke with RN about plans for TF.   Diet Order:  Diet NPO time specified  Skin:   (stage II buttocks)  Last BM:  7/4 small  Height:   Ht Readings from Last 1  Encounters:  03/18/17 5\' 10"  (1.778 m)    Weight:   Wt Readings from Last 1 Encounters:  03/19/17 172 lb 2.9 oz (78.1 kg)    Ideal Body Weight:  75.4 kg  BMI:  Body mass index is 24.71 kg/m.  Estimated Nutritional Needs:   Kcal:  1823  Protein:  120-135 grams  Fluid:  > 1.8 L/day  EDUCATION NEEDS:   No education needs identified at this time  [place], [place], [place] Pager [phone] After Hours Pager

## 2017-03-20 ENCOUNTER — Inpatient Hospital Stay (HOSPITAL_COMMUNITY): Payer: Medicare Other

## 2017-03-20 DIAGNOSIS — J9601 Acute respiratory failure with hypoxia: Secondary | ICD-10-CM

## 2017-03-20 DIAGNOSIS — I6789 Other cerebrovascular disease: Secondary | ICD-10-CM

## 2017-03-20 LAB — LIPID PANEL
Cholesterol: 107 mg/dL (ref 0–200)
HDL: 14 mg/dL — ABNORMAL LOW (ref >40)
LDL Cholesterol: 32 mg/dL (ref 0–99)
Total CHOL/HDL Ratio: 7.6 RATIO
Triglycerides: 306 mg/dL — ABNORMAL HIGH (ref <150)
VLDL: 61 mg/dL — ABNORMAL HIGH (ref 0–40)

## 2017-03-20 LAB — GLUCOSE, CAPILLARY
GLUCOSE-CAPILLARY: 203 mg/dL — AB (ref 65–99)
GLUCOSE-CAPILLARY: 239 mg/dL — AB (ref 65–99)
GLUCOSE-CAPILLARY: 357 mg/dL — AB (ref 65–99)
Glucose-Capillary: 288 mg/dL — ABNORMAL HIGH (ref 65–99)
Glucose-Capillary: 267 mg/dL — ABNORMAL HIGH (ref 65–99)

## 2017-03-20 LAB — PROTIME-INR
INR: 1.62
Prothrombin Time: 19.4 seconds — ABNORMAL HIGH (ref 11.4–15.2)

## 2017-03-20 LAB — RENAL FUNCTION PANEL
Albumin: 2.1 g/dL — ABNORMAL LOW (ref 3.5–5.0)
Anion gap: 10 (ref 5–15)
BUN: 40 mg/dL — ABNORMAL HIGH (ref 6–20)
CO2: 25 mmol/L (ref 22–32)
Calcium: 7.9 mg/dL — ABNORMAL LOW (ref 8.9–10.3)
Chloride: 96 mmol/L — ABNORMAL LOW (ref 101–111)
Creatinine, Ser: 4.26 mg/dL — ABNORMAL HIGH (ref 0.61–1.24)
GFR calc Af Amer: 14 mL/min — ABNORMAL LOW (ref >60)
GFR calc non Af Amer: 12 mL/min — ABNORMAL LOW (ref >60)
Glucose, Bld: 270 mg/dL — ABNORMAL HIGH (ref 65–99)
Phosphorus: 4.6 mg/dL (ref 2.5–4.6)
Potassium: 3.9 mmol/L (ref 3.5–5.1)
Sodium: 131 mmol/L — ABNORMAL LOW (ref 135–145)

## 2017-03-20 LAB — BLOOD GAS, ARTERIAL
ACID-BASE EXCESS: 1.1 mmol/L (ref 0.0–2.0)
Bicarbonate: 25.5 mmol/L (ref 20.0–28.0)
Drawn by: 441351
O2 CONTENT: 28 L/min
O2 SAT: 97.1 %
PCO2 ART: 43.1 mmHg (ref 32.0–48.0)
PH ART: 7.39 (ref 7.350–7.450)
PO2 ART: 95.8 mmHg (ref 83.0–108.0)
Patient temperature: 98.6

## 2017-03-20 LAB — URINE CULTURE: Culture: >=100000 — AB

## 2017-03-20 LAB — CBC
HCT: 22.8 % — ABNORMAL LOW (ref 39.0–52.0)
Hemoglobin: 7.3 g/dL — ABNORMAL LOW (ref 13.0–17.0)
MCH: 31.1 pg (ref 26.0–34.0)
MCHC: 32 g/dL (ref 30.0–36.0)
MCV: 97 fL (ref 78.0–100.0)
Platelets: 219 10*3/uL (ref 150–400)
RBC: 2.35 MIL/uL — ABNORMAL LOW (ref 4.22–5.81)
RDW: 14.8 % (ref 11.5–15.5)
WBC: 15 10*3/uL — ABNORMAL HIGH (ref 4.0–10.5)

## 2017-03-20 LAB — ECHOCARDIOGRAM COMPLETE
Height: 70 in
Weight: 2776.03 oz

## 2017-03-20 LAB — MAGNESIUM: Magnesium: 2.7 mg/dL — ABNORMAL HIGH (ref 1.7–2.4)

## 2017-03-20 NOTE — Progress Notes (Signed)
Modified Barium Swallow Progress Note  Patient Details  Name: James David MRN: [id number] Date of Birth: 1936/05/14  Today's Date: 03/20/2017  Modified Barium Swallow completed.  Full report located under Chart Review in the Imaging Section.  Brief recommendations include the following:  Clinical Impression  Pt presents with a mild oropharyngeal dysphagia that is likely multifactorial in nature, given neuro diagnoses (concern for acute MG flare and acute pontomedullary infarct), structural component (suspected osteophyte C4-C5 and cervical hardware C5-C6), and suspected esophageal issues. His oral phase is marked by mildly prolonged mastication and occasional premature spillage, particularly with solids and mixed consistencies, with material filling his vallecular space as he continues to masticate what remains orally. His swallow trigger is relatively timely for his age but with reduced hyolaryngeal elevation and particularly excursion. He has silent penetration during the swallow with thin liquids by straw that almost reaches his vocal folds, but this did clear well with a cued throat clear. Additional penetration occurred with thin liquids as he swallowed them with the barium tablet, but he spontaneously cleared his throat to expectorate it during that instance. No other airway compromise was observed, although he does have moderate residuals in the vallecula and pyriform sinuses that increases with thinner liquids that increases aspiration risk. He needs cues to attempt a second swallow which then reduces the residuals. Suspect that this is more esophageal in nature due to increased sub-UES pressure. Brief scan appeared to reveal barium remaining throughout the esophagus upon completion of testing. Recommend Dys 2 diet and thin liquids by cup with use of second swallows, esophageal precautions, and general aspiraiton precautions.    Swallow Evaluation Recommendations       SLP Diet  Recommendations: Dysphagia 2 (Fine chop) solids;Thin liquid   Liquid Administration via: Cup;No straw   Medication Administration: Whole meds with puree   Supervision: Patient able to self feed;Full supervision/cueing for compensatory strategies;Staff to assist with self feeding   Compensations: Minimize environmental distractions;Slow rate;Small sips/bites;Follow solids with liquid;Clear throat intermittently   Postural Changes: Remain semi-upright after after feeds/meals (Comment);Seated upright at 90 degrees   Oral Care Recommendations: Oral care BID        Germain Osgood 03/20/2017,3:35 PM   Germain Osgood, M.A. CCC-SLP [phone]

## 2017-03-20 NOTE — Evaluation (Signed)
Clinical/Bedside Swallow Evaluation Patient Details  Name: James David MRN: [id number] Date of Birth: Aug 09, 1936  Today's Date: 03/20/2017 Time: SLP Start Time (ACUTE ONLY): 1004 SLP Stop Time (ACUTE ONLY): 1035 SLP Time Calculation (min) (ACUTE ONLY): 31 min  Past Medical History:  Past Medical History:  Diagnosis Date  . Atrial fibrillation ([place])   . Chronic kidney disease, stage IV (severe) ([facility])    baseline creatinine 2-3  . DDD (degenerative disc disease)   . Diabetes mellitus without complication ([place])   . ESRD (end stage renal disease) on dialysis ([place])   . Hyperlipidemia   . Hypertension   . Melanoma ([place]) 2010   removed at [facility]  . Second degree heart block    a. s/p STJ dual chamber PPM - Dr [name]  . Stroke [facility]) 01/2017   Past Surgical History:  Past Surgical History:  Procedure Laterality Date  . AV FISTULA PLACEMENT Right 05/31/2014   Procedure: Right Arm Brachiocephalic ARTERIOVENOUS FISTULA CREATION  ;  Surgeon: Conrad St. Peters, MD;  Location: [place];  Service: Vascular;  Laterality: Right;  . CATARACT EXTRACTION W/ INTRAOCULAR LENS  IMPLANT, BILATERAL    . COLONOSCOPY    . EXCISION MELANOMA WITH SENTINEL LYMPH NODE BIOPSY Right 02/08/2016   Procedure: WIDE EXCISION RIGHT SHOULDER MELANOMA WITH RIGHT SENTINEL LYMPH NODE BIOPSY;  Surgeon: [name], MD;  Location: [place];  Service: General;  Laterality: Right;  . LAMINECTOMY    . PERMANENT PACEMAKER INSERTION N/A 01/20/2014   STJ Assurity dual chamber pacemaker implanted by Dr [name] for 2nd degree AV block   HPI:  James David is a 81 y.o.maleadmitted for possible myasthenia gravis crisis with increasing LE weakness and difficulty swallowing, although without coughing per H&P note. He had worsening respiratory status requiring intubation 7/3. New focal deficits inlcuding R sided weakness were noted on 7/4. CT perfusion study is concerning for an acute/subacute infarction at the pontomedullary junction in the midline. James David  self-extubated in the early morning 7/5. PMH includes ACDF (C5-C6), ESRD, DM, HLD, recent CVA, recently dx MG, second degree heart block, melanoma, DDD, a fib   Assessment / Plan / Recommendation Clinical Impression  James David presents with subjective c/o difficulty swallowing, although not with thin liquids and pureed solids tested today. He describes difficulty with POs getting "stuck" in his throat. His voice sounds strong and clear despite self-extubation, although he does have intermittent coughing with thin liquids that is concerning for reduced airway protection. Given multiple risk factors for dysphagia and aspiration (intubation with self-extubation, prior ACDF, MG, concern for pontomedullary CVA) as well as James David's subjective complaints, recommend proceeding with MBS. Recommend meds crushed in puree and small amounts of water/ice chips after oral care pending further testing. SLP Visit Diagnosis: Dysphagia, unspecified (R13.10)    Aspiration Risk  Moderate aspiration risk    Diet Recommendation NPO except meds;Ice chips PRN after oral care   Liquid Administration via: Cup;No straw Medication Administration: Crushed with puree Supervision: Patient able to self feed;Full supervision/cueing for compensatory strategies Compensations: Minimize environmental distractions;Slow rate;Small sips/bites Postural Changes: Seated upright at 90 degrees;Remain upright for at least 30 minutes after po intake    Other  Recommendations Oral Care Recommendations: Oral care QID;Oral care prior to ice chip/H20   Follow up Recommendations        Frequency and Duration            Prognosis Prognosis for Safe Diet Advancement: Good Barriers to Reach Goals: Cognitive deficits  Swallow Study   General HPI: James David is a 81 y.o.maleadmitted for possible myasthenia gravis crisis with increasing LE weakness and difficulty swallowing, although without coughing per H&P note. He had worsening respiratory status  requiring intubation 7/3. New focal deficits inlcuding R sided weakness were noted on 7/4. CT perfusion study is concerning for an acute/subacute infarction at the pontomedullary junction in the midline. James David self-extubated in the early morning 7/5. PMH includes ACDF (C5-C6), ESRD, DM, HLD, recent CVA, recently dx MG, second degree heart block, melanoma, DDD, a fib Type of Study: Bedside Swallow Evaluation Previous Swallow Assessment: none in chart Diet Prior to this Study: NPO Temperature Spikes Noted: No Respiratory Status: Nasal cannula History of Recent Intubation: Yes Length of Intubations (days): 2 days Date extubated: 03/20/17 Behavior/Cognition: Alert;Cooperative;Pleasant mood;Requires cueing Oral Cavity Assessment: Within Functional Limits Oral Care Completed by SLP: No Oral Cavity - Dentition: Adequate natural dentition Vision: Functional for self-feeding Self-Feeding Abilities: Able to feed self;Needs assist Patient Positioning: Other (comment) (EOB) Baseline Vocal Quality: Normal Volitional Cough: Weak (mildly)    Oral/Motor/Sensory Function Overall Oral Motor/Sensory Function: Mild impairment Lingual Symmetry: Abnormal symmetry right;Other (Comment) (subtle deviation)   Ice Chips Ice chips: Within functional limits Presentation: Spoon   Thin Liquid Thin Liquid: Impaired Presentation: Cup;Self Fed;Straw Pharyngeal  Phase Impairments: Cough - Immediate    Nectar Thick Nectar Thick Liquid: Not tested   Honey Thick Honey Thick Liquid: Not tested   Puree Puree: Within functional limits Presentation: Self Fed;Spoon   Solid   GO   Solid: Not tested        Germain Osgood 03/20/2017,10:54 AM   Germain Osgood, M.A. CCC-SLP [phone]

## 2017-03-20 NOTE — Progress Notes (Signed)
Occupational Therapy Evaluation Patient Details Name: James David MRN: [id number] DOB: [birth year] Today's Date: 03/20/2017    History of Present Illness 81 y.o. male with medical history significant for diabetes mellitus 2 on oral agents, chronic kidney disease on dialysis, atrial fibrillation on chronic warfarin, anemia of chronic kidney disease, dyslipidemia. Recent admission for right subcortical stroke mid May 2018. Patient completed therapy in CIR and was discharged home at that time he was able to mobilize with the assistance of a rolling walker. Diagnosed  mid June with myasthenia gravis. Developed respiratory distress 7/3 and was intubated. Self extubated 7/5. CT - no sign of acute infarct.    Clinical Impression   PTA, pt living at home with wife and active in outpt therapy. Pt demonstrates a significant decline in function and requires Max A with ADL and mod A +2 with limited mobility (sit - stand) due to below listed deficits. Attempted side stepping to R, however, but unable to at this time. Nsg asked to return pt to bed as he is transferring to another unit.  Pt very motivated to return to PLOF. Feel pt is an excellent CIR candidate and would benefit from therapy at CIR to maximize functional level of independence. Will follow acutely to address established goals. Pt appreciative.     Follow Up Recommendations  CIR;Supervision/Assistance - 24 hour    Equipment Recommendations  Other (comment) (will further assess)    Recommendations for Other Services Rehab consult     Precautions / Restrictions Precautions Precautions: Fall;ICD/Pacemaker Restrictions Weight Bearing Restrictions: No      Mobility Bed Mobility Overal bed mobility: Needs Assistance Bed Mobility: Supine to Sit;Sit to Supine     Supine to sit: Mod assist Sit to supine: Mod assist   General bed mobility comments: Pt ableto move BLE off bed. A to trnasition from sidelying to upright  sitting  Transfers Overall transfer level: Needs assistance Equipment used: 2 person hand held assist Transfers: Sit to/from Stand Sit to Stand: Mod assist;+2 physical assistance              Balance Overall balance assessment: Needs assistance   Sitting balance-Leahy Scale: Poor Sitting balance - Comments: L lateral lean; anterior lean     Standing balance-Leahy Scale: Poor                             ADL either performed or assessed with clinical judgement   ADL Overall ADL's : Needs assistance/impaired Eating/Feeding: Minimal assistance Eating/Feeding Details (indicate cue type and reason): Pt able to hold spoon and bring utensil to mouth. Attention affecting postural control during self feeding. Speech further assessing diet with MBS Grooming: Moderate assistance;Sitting   Upper Body Bathing: Moderate assistance;Sitting   Lower Body Bathing: Maximal assistance;Sit to/from stand   Upper Body Dressing : Moderate assistance;Sitting   Lower Body Dressing: Maximal assistance;Sit to/from stand   Toilet Transfer: Moderate assistance;+2 for physical assistance (simulated)   Toileting- Clothing Manipulation and Hygiene: Maximal assistance       Functional mobility during ADLs: Moderate assistance;+2 for physical assistance       Vision Baseline Vision/History: Wears glasses Wears Glasses: Reading only Additional Comments: will further assess     Perception     Praxis      Pertinent Vitals/Pain Pain Assessment: No/denies pain     Hand Dominance Right   Extremity/Trunk Assessment Upper Extremity Assessment Upper Extremity Assessment: Generalized weakness   Lower Extremity  Assessment Lower Extremity Assessment: Defer to PT evaluation   Cervical / Trunk Assessment Cervical / Trunk Assessment: Kyphotic;Other exceptions (L bias)   Communication Communication Communication: No difficulties (slightly slurred speech)   Cognition  Arousal/Alertness: Awake/alert Behavior During Therapy: WFL for tasks assessed/performed Overall Cognitive Status: Impaired/Different from baseline Area of Impairment: Orientation;Attention;Memory;Following commands;Safety/judgement;Awareness;Problem solving                 Orientation Level: Disoriented to;Time Current Attention Level: Sustained Memory: Decreased short-term memory;Decreased recall of precautions Following Commands: Follows one step commands consistently Safety/Judgement: Decreased awareness of safety;Decreased awareness of deficits Awareness: Emergent Problem Solving: Slow processing     General Comments       Exercises     Shoulder Instructions      Home Living Family/patient expects to be discharged to:: Private residence Living Arrangements: Spouse/significant other Available Help at Discharge: Family;Available 24 hours/day Type of Home: Other(Comment) Home Access: Level entry     Home Layout: One level     Bathroom Shower/Tub: Occupational psychologist: Handicapped height Bathroom Accessibility: Yes How Accessible: Accessible via walker Home Equipment: Grab bars - tub/shower          Prior Functioning/Environment Level of Independence: Independent with assistive device(s)        Comments: ambulating with RW since DC from CIR. going to outpt therapy        OT Problem List: Decreased strength;Decreased activity tolerance;Impaired balance (sitting and/or standing);Decreased coordination;Decreased cognition;Decreased safety awareness;Decreased knowledge of use of DME or AE;Decreased knowledge of precautions      OT Treatment/Interventions: Self-care/ADL training;Therapeutic exercise;Neuromuscular education;DME and/or AE instruction;Therapeutic activities;Cognitive remediation/compensation;Visual/perceptual remediation/compensation;Patient/family education;Balance training    OT Goals(Current goals can be found in the care plan  section) Acute Rehab OT Goals Patient Stated Goal: to get better OT Goal Formulation: With patient Time For Goal Achievement: 04/03/17 Potential to Achieve Goals: Good ADL Goals Pt Will Perform Eating: with set-up;with supervision;sitting Pt Will Perform Grooming: with set-up;with supervision;sitting Pt Will Perform Upper Body Bathing: with set-up;with supervision;sitting Pt Will Perform Lower Body Bathing: with min assist;sit to/from stand Pt Will Transfer to Toilet: with min guard assist;bedside commode;ambulating Additional ADL Goal #1: Pt will demonstrate anticipatory awareness during ADL task with min vc in nondistracting environment  OT Frequency: Min 3X/week   Barriers to D/C:            Co-evaluation   Reason for Co-Treatment: Complexity of the patient's impairments (multi-system involvement);Necessary to address cognition/behavior during functional activity;For patient/therapist safety     SLP goals addressed during session: Cognition;Swallowing;Communication    AM-PAC PT "6 Clicks" Daily Activity     Outcome Measure Help from another person eating meals?: A Lot Help from another person taking care of personal grooming?: A Lot Help from another person toileting, which includes using toliet, bedpan, or urinal?: A Lot Help from another person bathing (including washing, rinsing, drying)?: A Lot Help from another person to put on and taking off regular upper body clothing?: A Lot Help from another person to put on and taking off regular lower body clothing?: A Lot 6 Click Score: 12   End of Session Equipment Utilized During Treatment: Gait belt Nurse Communication: Mobility status  Activity Tolerance: Patient tolerated treatment well Patient left: in bed;with call bell/phone within reach;with bed alarm set  OT Visit Diagnosis: Unsteadiness on feet (R26.81);Other abnormalities of gait and mobility (R26.89);Muscle weakness (generalized) (M62.81);Other symptoms and signs  involving cognitive function  Time: 1010-1030 OT Time Calculation (min): 20 min Charges:  OT General Charges $OT Visit: 1 Procedure OT Evaluation $OT Eval Moderate Complexity: 1 Procedure G-Codes:     Jyrah Blye, OT/L  [phone] 03/20/2017  Alyzza Andringa,HILLARY 03/20/2017, 1:03 PM

## 2017-03-20 NOTE — Progress Notes (Signed)
Rehab Admissions Coordinator Note:  Patient was screened by Retta Diones for appropriateness for an Inpatient Acute Rehab Consult.  Noted SLP recommending inpatient rehab.  Will await PT/OT evaluations and recommendations to determine functional levels and then decide if inpatient rehab consult is warranted.    Jodell Cipro M 03/20/2017, 11:12 AM  I can be reached at [phone].

## 2017-03-20 NOTE — Progress Notes (Signed)
NIF performed with good effort for results of >-40 each attempt

## 2017-03-20 NOTE — Progress Notes (Signed)
Patient performed a NIF of -35 following Duoneb breathing treatment. RT will continue to monitor.

## 2017-03-20 NOTE — Progress Notes (Signed)
150cc Fentanyl wasted with Ward Chatters. RN

## 2017-03-20 NOTE — Progress Notes (Signed)
Inpatient Diabetes Program Recommendations  AACE/ADA: New Consensus Statement on Inpatient Glycemic Control (2015)  Target Ranges:  Prepandial:   less than 140 mg/dL      Peak postprandial:   less than 180 mg/dL (1-2 hours)      Critically ill patients:  140 - 180 mg/dL   Results for James David, James David (MRN [id number]) as of 03/20/2017 11:09  Ref. Range 03/19/2017 16:18 03/19/2017 20:11 03/19/2017 23:41 03/20/2017 04:07 03/20/2017 07:45  Glucose-Capillary Latest Ref Range: 65 - 99 mg/dL 201 (H) 272 (H) 264 (H) 288 (H) 267 (H)   Review of Glycemic Control  Diabetes history: DM 2 Outpatient Diabetes medications: Amaryl 1 mg Daily Current orders for Inpatient glycemic control: Novolog Sensitive Q4hours  Inpatient Diabetes Program Recommendations:    Patient receiving IV Solumedrol 80 mg Q24 hours. Glucose in the 200's. Patient also receiving Nepro 30 ml/hr. Please consider 3 units Novolog Tube Feed Coverage Q4 hours (don't give if feeds are stopped or held for any reason).  Thanks,  Tama Headings RN, MSN, [facility] Inpatient Diabetes Coordinator Team Pager [phone] (8a-5p)

## 2017-03-20 NOTE — Progress Notes (Signed)
Patient taken by transport to modified barium swallow evaluation

## 2017-03-20 NOTE — Progress Notes (Signed)
  Echocardiogram 2D Echocardiogram has been performed.  James David 03/20/2017, 12:51 PM

## 2017-03-20 NOTE — Progress Notes (Signed)
Responded to patient vent alarm, patient partially self extubated. Unable to advance tube, fully removed tube. Patient placed on non-rebreather, SpO2 100%, good cough. Patient responding with name, breath sounds clear bilaterally. Patient placed on 2L [place], SpO2 100%, MD aware. Will continue to monitor.

## 2017-03-20 NOTE — Procedures (Signed)
Extubation Procedure Note  Patient Details:   Name: James David DOB: Jan 31, 1936 MRN: [id number]   Airway Documentation:     Evaluation  O2 sats: stable throughout Complications: No apparent complications Patient did tolerate procedure well. Bilateral Breath Sounds: Clear, Diminished   Yes   Patient self extubated. Patient has good cough, is able to speak, and has clear bilateral breath sounds. Patient is currently on 4L O2. RT will monitor as needed.  [name] 03/20/2017, 2:31 AM

## 2017-03-20 NOTE — Evaluation (Signed)
Speech Language Pathology Evaluation Patient Details Name: James David MRN: [id number] DOB: [birth year] Today's Date: 03/20/2017 Time: 0086-7619 SLP Time Calculation (min) (ACUTE ONLY): 31 min  Problem List:  Patient Active Problem List   Diagnosis Date Noted  . Pressure injury of skin 03/18/2017  . Acute respiratory failure with hypoxia ([place])   . Myasthenia gravis in crisis ([place]) 03/17/2017  . Diabetes mellitus without complication ([place]) 50/93/2671  . History of CVA in adulthood 03/17/2017  . Atrial fibrillation, permanent ([place]) 03/17/2017  . Supratherapeutic INR 03/17/2017  . HTN (hypertension) 03/17/2017  . Dyslipidemia 03/17/2017  . ESRD on dialysis ([place]) 03/17/2017  . Anemia 03/17/2017  . Low back pain 02/28/2017  . Neck pain 02/28/2017  . Polyneuropathy 02/28/2017  . Gait disturbance, post-stroke   . Subcortical infarction ([place]) 02/06/2017  . Intracranial vascular stenosis 02/04/2017  . Syncope   . Orthostatic hypotension   . Stroke ([place]) 02/01/2017  . TIA (transient ischemic attack) 01/29/2017  . Hypertension 01/29/2017  . CKD (chronic kidney disease) stage V requiring chronic dialysis ([place]) 01/29/2017  . DDD (degenerative disc disease) 01/29/2017  . HLD (hyperlipidemia) 01/29/2017  . Second degree heart block 01/29/2017  . Atrial flutter ([place]) 01/29/2017  . ESRD (end stage renal disease) on dialysis ([place])   . Melanoma of skin ([place]) 03/13/2016  . Atrial flutter ([place]) 06/26/2015  . Pacemaker 10/13/2014  . End stage renal disease ([place]) 07/15/2014  . Chronic diastolic heart failure ([place]) 03/24/2014  . Second-degree heart block 12/13/2013  . Chronic kidney disease, stage IV (severe) ([place]) 12/13/2013  . Essential hypertension 12/13/2013  . Diabetes mellitus due to underlying condition with diabetic nephropathy ([place]) 12/13/2013  . Hyperlipidemia 12/13/2013   Past Medical History:  Past Medical History:  Diagnosis Date  . Atrial fibrillation ([place])   . Chronic  kidney disease, stage IV (severe) (H[facility])    baseline creatinine 2-3  . DDD (degenerative disc disease)   . Diabetes mellitus without complication ([place])   . ESRD (end stage renal disease) on dialysis ([place])   . Hyperlipidemia   . Hypertension   . Melanoma ([place]) 2010   removed at [facility]  . Second degree heart block    a. s/p STJ dual chamber PPM - Dr [name]  . Stroke [facility]) 01/2017   Past Surgical History:  Past Surgical History:  Procedure Laterality Date  . AV FISTULA PLACEMENT Right 05/31/2014   Procedure: Right Arm Brachiocephalic ARTERIOVENOUS FISTULA CREATION  ;  Surgeon: Conrad [place], MD;  Location: [place];  Service: Vascular;  Laterality: Right;  . CATARACT EXTRACTION W/ INTRAOCULAR LENS  IMPLANT, BILATERAL    . COLONOSCOPY    . EXCISION MELANOMA WITH SENTINEL LYMPH NODE BIOPSY Right 02/08/2016   Procedure: WIDE EXCISION RIGHT SHOULDER MELANOMA WITH RIGHT SENTINEL LYMPH NODE BIOPSY;  Surgeon: [name], MD;  Location: [place];  Service: General;  Laterality: Right;  . LAMINECTOMY    . PERMANENT PACEMAKER INSERTION N/A 01/20/2014   STJ Assurity dual chamber pacemaker implanted by Dr [name] for 2nd degree AV block   HPI:  Pt is a 81 y.o.maleadmitted for possible myasthenia gravis crisis with increasing LE weakness and difficulty swallowing, although without coughing per H&P note. He had worsening respiratory status requiring intubation 7/3. New focal deficits inlcuding R sided weakness were noted on 7/4. CT perfusion study is concerning for an acute/subacute infarction at the pontomedullary junction in the midline. Pt self-extubated in the early morning 7/5. PMH includes ACDF (C5-C6), ESRD, DM, HLD, recent  CVA, recently dx MG, second degree heart block, melanoma, DDD, a fib   Assessment / Plan / Recommendation Clinical Impression  Pt has impaired cognitive function in comparison to prior SLP administration of the [place] on 02/14/17 with a score of 28/30, indicative of cognitive function that  [facility]. Today, he is disoriented to time and needs Mod cues for intellectual and emergent awareness of physical deficits. Impairments are also noted with command following, recall of new information, and basic problem solving. He will benefit from SLP f/u and CIR level therapy to maximize cognitive function and independence.    SLP Assessment  SLP Recommendation/Assessment: Patient needs continued Speech Lanaguage Pathology Services SLP Visit Diagnosis: Cognitive communication deficit (R41.841)    Follow Up Recommendations  Inpatient Rehab    Frequency and Duration min 2x/week  2 weeks      SLP Evaluation Cognition  Overall Cognitive Status: Impaired/Different from baseline Arousal/Alertness: Awake/alert Orientation Level: Oriented to person;Oriented to place;Disoriented to time Attention: Sustained Sustained Attention: Impaired Sustained Attention Impairment: Functional basic Memory: Impaired Memory Impairment: Decreased recall of new information Awareness: Impaired Awareness Impairment: Intellectual impairment;Emergent impairment;Anticipatory impairment Problem Solving: Impaired Problem Solving Impairment: Functional basic Executive Function: Self Monitoring Self Monitoring: Impaired Self Monitoring Impairment: Functional basic Safety/Judgment: Impaired       Comprehension  Auditory Comprehension Overall Auditory Comprehension: Impaired Commands: Impaired One Step Basic Commands: 50-74% accurate EffectiveTechniques: Repetition;Stressing words;Visual/Gestural cues    Expression Expression Primary Mode of Expression: Verbal Verbal Expression Overall Verbal Expression: Appears within functional limits for tasks assessed   Oral / Motor  Oral Motor/Sensory Function Overall Oral Motor/Sensory Function: Mild impairment Lingual Symmetry: Abnormal symmetry right;Other (Comment) (subtle) Motor Speech Overall Motor Speech: Appears within functional limits for tasks assessed    GO                    Germain Osgood 03/20/2017, 11:03 AM  Germain Osgood, M.A. CCC-SLP [phone]

## 2017-03-20 NOTE — Progress Notes (Signed)
eLink Physician-Brief Progress Note Patient Name: James David DOB: [birth year] MRN: [id number]   Date of Service  03/20/2017  HPI/Events of Note  Pt self extubated. Appears to be doing well on camera. Sat of 95% on RA, comfortable respiration with RR=12. Pt awake, and responding to bedside RN and RT.   eICU Interventions   No need for reintubation at this time. Continue to monitor.      Intervention Category Major Interventions: Respiratory failure - evaluation and management  Piera Downs 03/20/2017, 2:21 AM

## 2017-03-20 NOTE — Progress Notes (Signed)
Name: James David MRN: [id number] DOB: [birth year]    ADMISSION DATE:  03/17/2017 CONSULTATION DATE:  03/18/17  REFERRING MD :  Sherral Hammers  CHIEF COMPLAINT:  SOB   brief:  James David is a 81 y.o. male with a PMH as outlined below including but not limited to recent diagnosis of probable Myasthenia Gravis (was seen as outpatient and had positive acetylcholine receptor antibodies).  He was started on mestinon but did not tolerate it due to severe diarrhea.  He has had progressive weakness for the past month or two to the point where he has had difficulty getting out of bed and has had difficulty swallowing.  He has not had any diplopia or SOB at home.  He was admitted 7/2 with generalized weakness.  He was evaluated by neurology who felt that he likely did have Myasthenia and recommended treatment with IVIG for 5 daily doses (holding steroids for a few days).  On 7/3, he had hypoxia with mild increased WOB; therefore, PCCM was asked to see.  He was placed on NRB which he feels is helping.  He has strong cough and is able to control airway well.  He denies sensation of respiratory fatigue.  He is awaiting his scheduled hemodialysis (he did receive 80mg  lasix prior to [place] seeing him.  [place] urine documented as - 44).   STUDIES:  CXR 7/2 > no acute process.  SIGNIFICANT EVENTS  7/2 > admit. 7/3 > PCCM consult.   Has mild dyspnea but does not feel tired.  Able to follow commands.  Good cough, controlling airway well.   SUBJECTIVE/OVERNIGHT/INTERVAL HX Self extubated - Has stable resp status.   VITAL SIGNS: Temp:  [97.3 F (36.3 C)-98.9 F (37.2 C)] 97.8 F (36.6 C) (07/05 0409) Pulse Rate:  [62-103] 71 (07/05 0700) Resp:  [5-21] 7 (07/05 0700) BP: (101-175)/(36-94) 130/65 (07/05 0700) SpO2:  [100 %] 100 % (07/05 0840) FiO2 (%):  [40 %] 40 % (07/05 0147) Weight:  [172 lb 2.9 oz (78.1 kg)-173 lb 8 oz (78.7 kg)] 173 lb 8 oz (78.7 kg) (07/05 0500)  PHYSICAL EXAMINATION:  EXAM Gen:      No  acute distress HEENT:  EOMI, sclera anicteric Neck:     No masses; no thyromegaly Lungs:    Clear to auscultation bilaterally; normal respiratory effort CV:         Regular rate and rhythm; no murmurs Abd:      + bowel sounds; soft, non-tender; no palpable masses, no distension Ext:    No edema; adequate peripheral perfusion Skin:      Warm and dry; no rash Neuro: alert and oriented x 3 Psych: normal mood and affect     LABS  PULMONARY  Recent Labs Lab 03/18/17 1750 03/18/17 2103 03/20/17 0410  PHART 7.512* 7.471* 7.390  PCO2ART 32.1 36.8 43.1  PO2ART 202* 197.0* 95.8  HCO3 25.5 26.7 25.5  TCO2  --  28  --   O2SAT 99.5 100.0 97.1    CBC  Recent Labs Lab 03/18/17 0245 03/19/17 0315 03/20/17 0500  HGB 7.8* 7.3* 7.3*  HCT 24.2* 23.0* 22.8*  WBC 6.5 16.2* 15.0*  PLT 198 192 219    COAGULATION  Recent Labs Lab 03/17/17 1304 03/18/17 0245 03/18/17 1251 03/19/17 0315 03/20/17 0500  INR 5.90* 6.78* 2.64 1.79 1.62    CARDIAC  No results for input(s): TROPONINI in the last 168 hours. No results for input(s): PROBNP in the last 168 hours.   CHEMISTRY  Recent Labs Lab 03/17/17 1257 03/17/17 1817 03/18/17 0245 03/19/17 0315 03/19/17 1658 03/20/17 0500  NA 133*  --  132* 132*  --  131*  K 3.3*  --  2.9* 4.6  --  3.9  CL 93*  --  95* 96*  --  96*  CO2 28  --  26 24  --  25  GLUCOSE 147*  --  109* 211*  --  270*  BUN 54*  --  60* 23*  --  40*  CREATININE 9.23*  --  10.09* 5.45*  --  4.26*  CALCIUM 8.9  --  8.5* 7.6*  --  7.9*  MG  --  2.2  --  1.8 2.6* 2.7*  PHOS  --  3.5  --  4.6 3.9 4.6   Estimated Creatinine Clearance: 14.8 mL/min (A) (by C-G formula based on SCr of 4.26 mg/dL (H)).   LIVER  Recent Labs Lab 03/17/17 1257 03/17/17 1304 03/18/17 0245 03/18/17 1251 03/19/17 0315 03/20/17 0500  AST 17  --  12*  --   --   --   ALT 15*  --  14*  --   --   --   ALKPHOS 34*  --  33*  --   --   --   BILITOT 0.7  --  0.7  --   --   --   PROT  5.5*  --  5.9*  --   --   --   ALBUMIN 3.0*  --  2.7*  --   --  2.1*  INR  --  5.90* 6.78* 2.64 1.79 1.62     INFECTIOUS  Recent Labs Lab 03/18/17 1314 03/18/17 1618 03/19/17 0315  LATICACIDVEN 1.1 0.6  --   PROCALCITON  --   --  >150.00     ENDOCRINE CBG (last 3)   Recent Labs  03/19/17 2341 03/20/17 0407 03/20/17 0745  GLUCAP 264* 288* 33*    ASSESSMENT / PLAN:  PULMONARY A: Acute resp failure due to MG crisis and sepsis  P:   Stable after he self extubated Monitor for reintubation needs Wean down O2 as tolerated Follow CXR  CARDIOVASCULAR A:  Afib HTN, hyperlipidemia  P:  Tele monitoring Rate control with lopresor Restart coumadin per pharmacy when able to take PO  RENAL Chronic HD ESRD MWF patient P:   HD per renal  GASTROINTESTINAL A:   No issues  P:   Keep NPO Swallow eval  HEMATOLOGIC Leukocytosis from sepsis Anemia of chronic illness Supra therapeutic INR P:  Follow CBC  INFECTIOUS A:   Severe sepsis with Ecoli bacteremia, UTI P:   Continue zosyn. Follow final cultures  ENDOCRINE A:   On AC/HS   P:   ICU hyperglycemia protocol; !4h  NEUROLOGIC A:   Progressive weakness Myasthenia gravis with ? flare  P:   Continue neuro monitoring Finish IVIG Discussed with neurology  FAMILY  - Updates: 03/20/2017 --> no family at bedside - Inter-disciplinary family meet or Palliative Care meeting due by:  DAy 7. Current LOS is LOS 3 days  DISPO Keep in ICU  The patient is critically ill with multiple organ system failure and requires high complexity decision making for assessment and support, frequent evaluation and titration of therapies, advanced monitoring, review of radiographic studies and interpretation of complex data.   Critical Care Time devoted to patient care services, exclusive of separately billable procedures, described in this note is 35 minutes.   Marshell Garfinkel MD [place] [facility] Pager [phone] If no answer or after 3pm call: [phone] 03/20/2017, 11:57 AM

## 2017-03-20 NOTE — Progress Notes (Signed)
[facility] Progress Note  Subjective:    Feels good.   Self extubated overnight.  No SOB  Objective Vitals:   03/20/17 0600 03/20/17 0630 03/20/17 0700 03/20/17 0840  BP: (!) 149/51 (!) 139/51 130/65   Pulse: 81 71 71   Resp: 14 (!) 9 (!) 7   Temp:      TempSrc:      SpO2: 100% 100% 100% 100%  Weight:      Height:       Physical Exam General: NAD sats good on O2 per [facility]: RRR Lungs: no tales breathing easily Abdomen: soft + BS NT Extremities:  SCDs in place no LE edema Dialysis Access: right upper AVF + bruit  Dialysis Orders: [facility] MWF 4 hr- EDW 80 - getting slightly below at times 2 K 2.25 Ca 400/800 right upper AVF no heparin hectorol 4 venofer 50 6/25 mircera 75 6/20 - just ^ to 100 for next dose Recent labs: hgb 8.9 INR ok 25% sat ferritin 135 May ipTH 291  Background: 81 y.o. male with ESRD on HD since Jan 18, 2017 PMHx CVA and UTI, HTN, afib, right should melanoma, cervical disc disease, 2nd degree heart block s/p PPM, DM, myasthenia gravis. Admitted 7/2 with fever, hypoxia, MG crisis (related to intolerance to mestinon), acute hypoxemic resp failure (sepsis/MG crisis, +/-pulm edema), + BC for [facility]. Required intubation for resp fx.    Assessment/Plan:  1. Acute respiratory distress/failure-  HD goal 2 - 2.5 L Tuesday with net UF 1.3 7/3; became progressively weaker with respiratory distress; CXR did not explain the degree of respiratory distress. CT showed bilateral LL atx and layering smal L pleural effusion; electively intubated 7/3 with improvement. Self-extubated last ngiht. CXR 7/5 clearing of any edema- post HD bed wt 78.1 2. E Coli urosepsis (+ 1/2 Ecoli BC - Ecoli urine >100K colonies - Vanc stopped and Zosyn continued); WBC 15 K today Procalcitonin > 150 3. ESRD- MWF via AVF- last OP HD Friday 6/29 - had  HD 7/3 and 7/4 - next HD Friday K 3.9 - start on 3 K bath Friday and change if needed 4. Hypertension/volume- BP stable on HD Wed net  UF 1.5 L;  Coreg changed to IV lopressor BP stable; He is highly prone to orthostasis- need to start doing standing BP with HD if possible- keep systolic BP > 952 during tmt 5. Anemia- Fe def anemia heme + dose ESA today - Aranesp 100 - hold on Fe for now given bacteremia; transfuse prn hgb 7.8 7/3 down to 7.3 7/4 and 7/5 .  RN consulted with pulmonary 7/4  who said not to transfuse unless 6.9.  Follow CBC daily - can transfuse Friday on HD if needed -parameters written in HD orders 6. Metabolic bone disease-  hectorol IV with HD- add binders when eating if needed 7. Nutrition -- nepro/stat TF started but stopped with self extubated - NPO - should be able to start diet today - resume relevant oral meds when taking POs 8. Supra therapeutic INR - no heparin HD - vit K given-  INR elevated on admission - down to 1.62 9. Myasthenia gravis - IVIG per neuro 10. Possible stroke - await Dr. Cecil Cobbs eval of imaging studies 11. DM- per primary  Myriam Jacobson, PA-C [place] [phone] 03/19/2017,10:57 AM  LOS: 2 days    I have seen and examined this patient, reviewed the records, and agree with plan and assessment in the above  note with renal recommendations/intervention highlighted. Extubated self yesterday. IVIG/steroids for MG per neuro, also evaluating for possible stroke. Zosyn for [facility] bacteremia/UTI. Dialysis MWF next HD tomorrow. Renal related issues as above.   Georgeanne Frankland B,MD 03/20/2017 1:19 PM   Additional Objective Labs: Lab Results  Component Value Date   INR 1.62 03/20/2017   INR 1.79 03/19/2017   INR 2.64 03/18/2017    Recent Labs Lab 03/18/17 0245 03/19/17 0315 03/19/17 1658 03/20/17 0500  NA 132* 132*  --  131*  K 2.9* 4.6  --  3.9  CL 95* 96*  --  96*  CO2 26 24  --  25  GLUCOSE 109* 211*  --  270*  BUN 60* 23*  --  40*  CREATININE 10.09* 5.45*  --  4.26*  CALCIUM 8.5* 7.6*  --  7.9*  PHOS  --  4.6 3.9 4.6     Recent  Labs Lab 03/17/17 1257 03/18/17 0245 03/20/17 0500  AST 17 12*  --   ALT 15* 14*  --   ALKPHOS 34* 33*  --   BILITOT 0.7 0.7  --   PROT 5.5* 5.9*  --   ALBUMIN 3.0* 2.7* 2.1*    Recent Labs Lab 03/17/17 1257 03/18/17 0245 03/19/17 0315 03/20/17 0500  WBC 10.2 6.5 16.2* 15.0*  NEUTROABS 8.8*  --   --   --   HGB 8.1* 7.8* 7.3* 7.3*  HCT 25.5* 24.2* 23.0* 22.8*  MCV 96.2 95.3 97.5 97.0  PLT 184 198 192 219   Blood Culture    Component Value Date/Time   SDES BLOOD LEFT ARM 03/18/2017 1017   SDES BLOOD LEFT HAND 03/18/2017 1017   SPECREQUEST IN PEDIATRIC BOTTLE Blood Culture adequate volume 03/18/2017 1017   SPECREQUEST IN PEDIATRIC BOTTLE Blood Culture adequate volume 03/18/2017 1017   CULT ESCHERICHIA COLI SUSCEPTIBILITIES TO FOLLOW  (A) 03/18/2017 1017   CULT NO GROWTH < 24 HOURS 03/18/2017 1017   REPTSTATUS PENDING 03/18/2017 1017   REPTSTATUS PENDING 03/18/2017 1017      Recent Labs Lab 03/19/17 1618 03/19/17 2011 03/19/17 2341 03/20/17 0407 03/20/17 0745  GLUCAP 201* 272* 264* 288* 267*   Iron Studies:  Recent Labs  03/18/17 1017  IRON 26*  TIBC 211*  FERRITIN 457*   Lab Results  Component Value Date   INR 1.62 03/20/2017   INR 1.79 03/19/2017   INR 2.64 03/18/2017   Studies/Results: Ct Angio Head W Or Wo Contrast  Result Date: 03/19/2017 CLINICAL DATA:  Gaze palsy. Right-sided weakness beginning yesterday. EXAM: CT ANGIOGRAPHY HEAD AND NECK CT PERFUSION BRAIN TECHNIQUE: Multidetector CT imaging of the head and neck was performed using the standard protocol during bolus administration of intravenous contrast. Multiplanar CT image reconstructions and MIPs were obtained to evaluate the vascular anatomy. Carotid stenosis measurements (when applicable) are obtained utilizing NASCET criteria, using the distal internal carotid diameter as the denominator. Multiphase CT imaging of the brain was performed following IV bolus contrast injection. Subsequent  parametric perfusion maps were calculated using RAPID software. CONTRAST:  90 cc Isovue 370 COMPARISON:  CT earlier same day.  CT angiography 02/04/2017. FINDINGS: CTA NECK FINDINGS Aortic arch: Aortic atherosclerosis. No dissection. Branching pattern of the brachiocephalic vessels from the arch is normal without origin stenosis. Right carotid system: Common carotid artery widely patent to the bifurcation region. Atherosclerotic calcification of the carotid bifurcation but no stenosis. Cervical ICA widely patent to the skullbase. Left carotid system: Common carotid artery widely patent to the bifurcation. Atherosclerotic  calcification of the carotid bifurcation. 20% stenosis of the proximal ICA. Wide patency beyond that. Vertebral arteries: There is atherosclerotic disease of both vertebral artery origins with stenosis estimated at 50% on the left and 70% on the right. Both vessels do show flow through the cervical region to the foramen magnum. Skeleton: Previous ACDF C5-6. Mild spondylosis above and below that. 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 Thyroid nodules unchanged since previous. Upper chest: Negative Review of the MIP images confirms the above findings CTA HEAD FINDINGS Anterior circulation: Both internal carotid arteries are patent through the skullbase and siphon regions. Atherosclerotic calcification in the siphon regions but no stenosis greater than 30%. Supraclinoid internal carotid arteries are stenotic, 50-70% on the right and 50% on the left. Anterior and middle cerebral vessels are patent without correctable proximal stenosis. Moderate narrowing A1 segment on the right. No large vessel occlusion. Posterior circulation: Both vertebral arteries are patent at the foramen magnum. The right vertebral artery is occluded or severely stenotic past PICA. Left vertebral artery is patent to the basilar. Basilar artery shows mild narrowing and irregularity but  no focal or correctable stenosis. Posterior circulation branch vessels are patent. Left PCA arises from the anterior circulation. Venous sinuses: Patent and normal Anatomic variants: None significant Delayed phase: No abnormal enhancement. There is low-density in the midline at the pontomedullary junction that I think is a real finding an was present on the immediate prior exam. This probably represents a brainstem infarction. Review of the MIP images confirms the above findings CT Brain Perfusion Findings: CBF (<30%) Volume: 13m Perfusion (Tmax>6.0s) volume: 034mMismatch Volume: 38m66mnfarction Location:None identified. IMPRESSION: I think there is an acute/ subacute infarction at the pontomedullary junction in the midline. This is below the region accurately evaluated by the perfusion exam. No medium or large vessel occlusion.  CT perfusion is negative. CT angiography does not appear different than the study of 02/04/2017. Atherosclerotic change of both carotid bifurcations but without flow limiting stenosis. Atherosclerotic change in both carotid siphon regions and supraclinoid internal carotid arteries. Supraclinoid ICA stenosis measures 50-70% on the right and approximately 50% on the left. Bilateral vertebral artery origin stenoses estimated at 50% on the left and 70% on the right. Severe stenosis or occlusion of the right vertebral artery distal to PICA. Left vertebral artery sufficiently patent to the basilar. Basilar artery shows some narrowing and atherosclerotic irregularity. These results were called by telephone at the time of interpretation on 03/19/2017 at 1450 pm to Dr. MCNRoland Rackwho verbally acknowledged these results. Electronically Signed   By: MarNelson ChimesD.   On: 03/19/2017 15:24   Ct Head Wo Contrast  Result Date: 03/19/2017 CLINICAL DATA:  New onset of left-sided neglect in weakness since yesterday. EXAM: CT HEAD WITHOUT CONTRAST TECHNIQUE: Contiguous axial images were obtained  from the base of the skull through the vertex without intravenous contrast. COMPARISON:  02/16/2017 FINDINGS: Brain: Mild generalized atrophy. Moderate chronic small-vessel ischemic changes of the cerebral hemispheric white matter. Old left caudate infarction. No sign of acute infarction, mass lesion, hemorrhage, hydrocephalus or extra-axial collection. Vascular: There is atherosclerotic calcification of the major vessels at the base of the brain. Skull: Negative Sinuses/Orbits: Clear/normal Other: None significant IMPRESSION: No acute finding by CT. Atrophy and chronic small-vessel ischemic changes. Electronically Signed   By: MarNelson ChimesD.   On: 03/19/2017 12:49   Ct Angio Neck W Or Wo Contrast  Result Date: 03/19/2017 CLINICAL  DATA:  Gaze palsy. Right-sided weakness beginning yesterday. EXAM: CT ANGIOGRAPHY HEAD AND NECK CT PERFUSION BRAIN TECHNIQUE: Multidetector CT imaging of the head and neck was performed using the standard protocol during bolus administration of intravenous contrast. Multiplanar CT image reconstructions and MIPs were obtained to evaluate the vascular anatomy. Carotid stenosis measurements (when applicable) are obtained utilizing NASCET criteria, using the distal internal carotid diameter as the denominator. Multiphase CT imaging of the brain was performed following IV bolus contrast injection. Subsequent parametric perfusion maps were calculated using RAPID software. CONTRAST:  90 cc Isovue 370 COMPARISON:  CT earlier same day.  CT angiography 02/04/2017. FINDINGS: CTA NECK FINDINGS Aortic arch: Aortic atherosclerosis. No dissection. Branching pattern of the brachiocephalic vessels from the arch is normal without origin stenosis. Right carotid system: Common carotid artery widely patent to the bifurcation region. Atherosclerotic calcification of the carotid bifurcation but no stenosis. Cervical ICA widely patent to the skullbase. Left carotid system: Common carotid artery widely  patent to the bifurcation. Atherosclerotic calcification of the carotid bifurcation. 20% stenosis of the proximal ICA. Wide patency beyond that. Vertebral arteries: There is atherosclerotic disease of both vertebral artery origins with stenosis estimated at 50% on the left and 70% on the right. Both vessels do show flow through the cervical region to the foramen magnum. Skeleton: Previous ACDF C5-6. Mild spondylosis above and below that. 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 Thyroid nodules unchanged since previous. Upper chest: Negative Review of the MIP images confirms the above findings CTA HEAD FINDINGS Anterior circulation: Both internal carotid arteries are patent through the skullbase and siphon regions. Atherosclerotic calcification in the siphon regions but no stenosis greater than 30%. Supraclinoid internal carotid arteries are stenotic, 50-70% on the right and 50% on the left. Anterior and middle cerebral vessels are patent without correctable proximal stenosis. Moderate narrowing A1 segment on the right. No large vessel occlusion. Posterior circulation: Both vertebral arteries are patent at the foramen magnum. The right vertebral artery is occluded or severely stenotic past PICA. Left vertebral artery is patent to the basilar. Basilar artery shows mild narrowing and irregularity but no focal or correctable stenosis. Posterior circulation branch vessels are patent. Left PCA arises from the anterior circulation. Venous sinuses: Patent and normal Anatomic variants: None significant Delayed phase: No abnormal enhancement. There is low-density in the midline at the pontomedullary junction that I think is a real finding an was present on the immediate prior exam. This probably represents a brainstem infarction. Review of the MIP images confirms the above findings CT Brain Perfusion Findings: CBF (<30%) Volume: 54m Perfusion (Tmax>6.0s) volume: 033mMismatch Volume:  39m339mnfarction Location:None identified. IMPRESSION: I think there is an acute/ subacute infarction at the pontomedullary junction in the midline. This is below the region accurately evaluated by the perfusion exam. No medium or large vessel occlusion.  CT perfusion is negative. CT angiography does not appear different than the study of 02/04/2017. Atherosclerotic change of both carotid bifurcations but without flow limiting stenosis. Atherosclerotic change in both carotid siphon regions and supraclinoid internal carotid arteries. Supraclinoid ICA stenosis measures 50-70% on the right and approximately 50% on the left. Bilateral vertebral artery origin stenoses estimated at 50% on the left and 70% on the right. Severe stenosis or occlusion of the right vertebral artery distal to PICA. Left vertebral artery sufficiently patent to the basilar. Basilar artery shows some narrowing and atherosclerotic irregularity. These results were called by telephone at the time of  interpretation on 03/19/2017 at 1450 pm to Dr. Roland Rack , who verbally acknowledged these results. Electronically Signed   By: Nelson Chimes M.D.   On: 03/19/2017 15:24   Ct Cervical Spine W Contrast  Result Date: 03/18/2017 CLINICAL DATA:  81 year old male dialysis patient with worsening generalized weakness. Recently diagnosed with myasthenia gravis. Hyper reflexia. Fever. EXAM: CT CERVICAL SPINE WITH CONTRAST CT THORACIC SPINE WITH CONTRAST TECHNIQUE: Multidetector CT imaging of the cervical and thoracic spine was performed with intravenous contrast. Multiplanar CT image reconstructions were also generated. CONTRAST:  120m ISOVUE-300 IOPAMIDOL (ISOVUE-300) INJECTION 61% COMPARISON:  Lumbar spine CT 03/03/2017. CT cervical spine 03/03/2017, and earlier. FINDINGS: CT CERVICAL SPINE FINDINGS Alignment: Stable cervical lordosis. Cervicothoracic junction alignment is within normal limits. Bilateral posterior element alignment is within normal  limits. Skull base and vertebrae: Visualized skull base is intact. No atlanto-occipital dissociation. No acute osseous abnormality identified. Prior C5-C6 ACDF. Soft tissues and spinal canal: Negative visualized posterior fossa. Major vascular structures in the neck and at the skullbase appear patent. Neck soft tissues are stable compared to the CTA head and neck 02/04/2017, including right tracheoesophageal groove soft tissue nodule. Disc levels: C2-C3: Mild to moderate facet hypertrophy greater on the right. Borderline to mild spinal stenosis. Mild right C3 foraminal stenosis. C3-C4: Mild to moderate facet hypertrophy greater on the right. Ligament flavum hypertrophy suspected (series 11, image 39). Up to mild spinal stenosis. Borderline to mild right C4 foraminal stenosis. C4-C5: Disc space loss but mostly anterior endplate spurring. Mild facet hypertrophy. Ligament flavum hypertrophy suspected. No definite spinal stenosis. Borderline to mild left C5 foraminal stenosis. C5-C6:  Prior ACDF.  Solid arthrodesis.  No hardware loosening. C6-C7: Circumferential disc osteophyte complex with broad-based posterior component. Suspect mild to moderate spinal stenosis. Moderate to severe bilateral C7 foraminal stenosis in part due to endplate spurring. C7-T1: Right eccentric disc osteophyte complex. Mild to moderate facet hypertrophy greater on the left. Endplate spurring greater on the right. No definite spinal stenosis. Mild right C8 foraminal stenosis. CT THORACIC SPINE FINDINGS Segmentation:  Normal. Alignment: Exaggerated upper thoracic kyphosis.  No scoliosis. Vertebrae: Flowing osteophytes throughout the thoracic spine from T2 to the thoracolumbar junction. There is superimposed posterior element ankylosis beginning at T6-T7, and a extending to T10-T11. No acute osseous abnormality identified. Paraspinal and other soft tissues: Left chest cardiac pacemaker. Major airways are patent. Small layering left pleural  effusion. Dependent lower lobe opacity most resembles atelectasis. Mild thyroid goiter. No mediastinal lymphadenopathy. Calcified aortic atherosclerosis. Partial atrophy of the visible kidneys greater on the right. Negative visualized posterior paraspinal soft tissues. Disc levels: T1-T2: Partially calcified central disc protrusion. Mild to moderate spinal stenosis suspected (series 17, image 42). T2-T3: Ankylosis begins at this level via anterior endplate osteophytes. T3-T4: Ankylosis via endplate osteophytes. Mild endplate spurring. No definite spinal stenosis. T4-T5: Ankylosis via endplate osteophytes. Superimposed small calcified disc bulging. Borderline to mild spinal stenosis (series 17, image 37). T5-T6: Ankylosis via anterior endplate osteophytes.  No stenosis. T6-T7: Anterior and posterior element ankylosis. No spinal stenosis. Facet hypertrophy such that there is some osseous foraminal stenosis greater on the left. T7-T8: Ankylosis. Mild epidural lipomatosis. Borderline to mild spinal stenosis (series 17, image 34). T8-T9: Ankylosis with mild to moderate facet and ligament flavum hypertrophy with calcified ligament flavum. Mild endplate spurring. Mild to moderate spinal stenosis suspected (series 12, image 103 and series 17, image 34). T9-T10: Ankylosis. Mild to moderate posterior element hypertrophy. Mostly far lateral endplate spurring. Borderline to mild spinal  stenosis. T10-T11: Ankylosis. Severe facet hypertrophy greater on the right. No definite spinal stenosis but severe bilateral T10 foraminal stenosis. T11-T12: Interbody ankylosis.  No stenosis. T12-L1:  Interbody ankylosis.  No spinal stenosis. IMPRESSION: 1. Prior cervical ACDF at C5-C6 with solid arthrodesis. 2. Adjacent segment disease at C6-C7 with mild to moderate suspected degenerative cervical spinal stenosis. 3. Mild if any additional cervical spinal stenosis elsewhere, such as C2-C3. 4. Widespread thoracic spine ankylosis from T2  inferiorly. This is primarily due to flowing endplate osteophytes, but there is superimposed posterior element ankylosis from T6-T7 to T10-T11. 5. Mild to moderate degenerative thoracic spinal stenosis suspected at T1-T2 and T8-T9. 6. Mild if any additional thoracic spinal stenosis, such as at T4-T5 and T9-T10. 7. Severe thoracic facet degeneration at T10-T11 with severe T10 neural foraminal stenosis. 8.  No acute osseous abnormality identified. 9. Small layering left pleural effusion. Bilateral lower lobe atelectasis. Electronically Signed   By: Genevie Ann M.D.   On: 03/18/2017 20:03   Ct Thoracic Spine W Contrast  Result Date: 03/18/2017 CLINICAL DATA:  81 year old male dialysis patient with worsening generalized weakness. Recently diagnosed with myasthenia gravis. Hyper reflexia. Fever. EXAM: CT CERVICAL SPINE WITH CONTRAST CT THORACIC SPINE WITH CONTRAST TECHNIQUE: Multidetector CT imaging of the cervical and thoracic spine was performed with intravenous contrast. Multiplanar CT image reconstructions were also generated. CONTRAST:  132m ISOVUE-300 IOPAMIDOL (ISOVUE-300) INJECTION 61% COMPARISON:  Lumbar spine CT 03/03/2017. CT cervical spine 03/03/2017, and earlier. FINDINGS: CT CERVICAL SPINE FINDINGS Alignment: Stable cervical lordosis. Cervicothoracic junction alignment is within normal limits. Bilateral posterior element alignment is within normal limits. Skull base and vertebrae: Visualized skull base is intact. No atlanto-occipital dissociation. No acute osseous abnormality identified. Prior C5-C6 ACDF. Soft tissues and spinal canal: Negative visualized posterior fossa. Major vascular structures in the neck and at the skullbase appear patent. Neck soft tissues are stable compared to the CTA head and neck 02/04/2017, including right tracheoesophageal groove soft tissue nodule. Disc levels: C2-C3: Mild to moderate facet hypertrophy greater on the right. Borderline to mild spinal stenosis. Mild right C3  foraminal stenosis. C3-C4: Mild to moderate facet hypertrophy greater on the right. Ligament flavum hypertrophy suspected (series 11, image 39). Up to mild spinal stenosis. Borderline to mild right C4 foraminal stenosis. C4-C5: Disc space loss but mostly anterior endplate spurring. Mild facet hypertrophy. Ligament flavum hypertrophy suspected. No definite spinal stenosis. Borderline to mild left C5 foraminal stenosis. C5-C6:  Prior ACDF.  Solid arthrodesis.  No hardware loosening. C6-C7: Circumferential disc osteophyte complex with broad-based posterior component. Suspect mild to moderate spinal stenosis. Moderate to severe bilateral C7 foraminal stenosis in part due to endplate spurring. C7-T1: Right eccentric disc osteophyte complex. Mild to moderate facet hypertrophy greater on the left. Endplate spurring greater on the right. No definite spinal stenosis. Mild right C8 foraminal stenosis. CT THORACIC SPINE FINDINGS Segmentation:  Normal. Alignment: Exaggerated upper thoracic kyphosis.  No scoliosis. Vertebrae: Flowing osteophytes throughout the thoracic spine from T2 to the thoracolumbar junction. There is superimposed posterior element ankylosis beginning at T6-T7, and a extending to T10-T11. No acute osseous abnormality identified. Paraspinal and other soft tissues: Left chest cardiac pacemaker. Major airways are patent. Small layering left pleural effusion. Dependent lower lobe opacity most resembles atelectasis. Mild thyroid goiter. No mediastinal lymphadenopathy. Calcified aortic atherosclerosis. Partial atrophy of the visible kidneys greater on the right. Negative visualized posterior paraspinal soft tissues. Disc levels: T1-T2: Partially calcified central disc protrusion. Mild to moderate spinal stenosis suspected (series 17,  image 42). T2-T3: Ankylosis begins at this level via anterior endplate osteophytes. T3-T4: Ankylosis via endplate osteophytes. Mild endplate spurring. No definite spinal stenosis.  T4-T5: Ankylosis via endplate osteophytes. Superimposed small calcified disc bulging. Borderline to mild spinal stenosis (series 17, image 37). T5-T6: Ankylosis via anterior endplate osteophytes.  No stenosis. T6-T7: Anterior and posterior element ankylosis. No spinal stenosis. Facet hypertrophy such that there is some osseous foraminal stenosis greater on the left. T7-T8: Ankylosis. Mild epidural lipomatosis. Borderline to mild spinal stenosis (series 17, image 34). T8-T9: Ankylosis with mild to moderate facet and ligament flavum hypertrophy with calcified ligament flavum. Mild endplate spurring. Mild to moderate spinal stenosis suspected (series 12, image 103 and series 17, image 34). T9-T10: Ankylosis. Mild to moderate posterior element hypertrophy. Mostly far lateral endplate spurring. Borderline to mild spinal stenosis. T10-T11: Ankylosis. Severe facet hypertrophy greater on the right. No definite spinal stenosis but severe bilateral T10 foraminal stenosis. T11-T12: Interbody ankylosis.  No stenosis. T12-L1:  Interbody ankylosis.  No spinal stenosis. IMPRESSION: 1. Prior cervical ACDF at C5-C6 with solid arthrodesis. 2. Adjacent segment disease at C6-C7 with mild to moderate suspected degenerative cervical spinal stenosis. 3. Mild if any additional cervical spinal stenosis elsewhere, such as C2-C3. 4. Widespread thoracic spine ankylosis from T2 inferiorly. This is primarily due to flowing endplate osteophytes, but there is superimposed posterior element ankylosis from T6-T7 to T10-T11. 5. Mild to moderate degenerative thoracic spinal stenosis suspected at T1-T2 and T8-T9. 6. Mild if any additional thoracic spinal stenosis, such as at T4-T5 and T9-T10. 7. Severe thoracic facet degeneration at T10-T11 with severe T10 neural foraminal stenosis. 8.  No acute osseous abnormality identified. 9. Small layering left pleural effusion. Bilateral lower lobe atelectasis. Electronically Signed   By: Genevie Ann M.D.   On:  03/18/2017 20:03   Ct Cerebral Perfusion W Contrast  Result Date: 03/19/2017 CLINICAL DATA:  Gaze palsy. Right-sided weakness beginning yesterday. EXAM: CT ANGIOGRAPHY HEAD AND NECK CT PERFUSION BRAIN TECHNIQUE: Multidetector CT imaging of the head and neck was performed using the standard protocol during bolus administration of intravenous contrast. Multiplanar CT image reconstructions and MIPs were obtained to evaluate the vascular anatomy. Carotid stenosis measurements (when applicable) are obtained utilizing NASCET criteria, using the distal internal carotid diameter as the denominator. Multiphase CT imaging of the brain was performed following IV bolus contrast injection. Subsequent parametric perfusion maps were calculated using RAPID software. CONTRAST:  90 cc Isovue 370 COMPARISON:  CT earlier same day.  CT angiography 02/04/2017. FINDINGS: CTA NECK FINDINGS Aortic arch: Aortic atherosclerosis. No dissection. Branching pattern of the brachiocephalic vessels from the arch is normal without origin stenosis. Right carotid system: Common carotid artery widely patent to the bifurcation region. Atherosclerotic calcification of the carotid bifurcation but no stenosis. Cervical ICA widely patent to the skullbase. Left carotid system: Common carotid artery widely patent to the bifurcation. Atherosclerotic calcification of the carotid bifurcation. 20% stenosis of the proximal ICA. Wide patency beyond that. Vertebral arteries: There is atherosclerotic disease of both vertebral artery origins with stenosis estimated at 50% on the left and 70% on the right. Both vessels do show flow through the cervical region to the foramen magnum. Skeleton: Previous ACDF C5-6. Mild spondylosis above and below that. 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 Thyroid nodules unchanged since previous. Upper chest: Negative Review of the MIP images confirms the above findings CTA HEAD FINDINGS  Anterior circulation: Both internal carotid arteries are patent through the  skullbase and siphon regions. Atherosclerotic calcification in the siphon regions but no stenosis greater than 30%. Supraclinoid internal carotid arteries are stenotic, 50-70% on the right and 50% on the left. Anterior and middle cerebral vessels are patent without correctable proximal stenosis. Moderate narrowing A1 segment on the right. No large vessel occlusion. Posterior circulation: Both vertebral arteries are patent at the foramen magnum. The right vertebral artery is occluded or severely stenotic past PICA. Left vertebral artery is patent to the basilar. Basilar artery shows mild narrowing and irregularity but no focal or correctable stenosis. Posterior circulation branch vessels are patent. Left PCA arises from the anterior circulation. Venous sinuses: Patent and normal Anatomic variants: None significant Delayed phase: No abnormal enhancement. There is low-density in the midline at the pontomedullary junction that I think is a real finding an was present on the immediate prior exam. This probably represents a brainstem infarction. Review of the MIP images confirms the above findings CT Brain Perfusion Findings: CBF (<30%) Volume: 49m Perfusion (Tmax>6.0s) volume: 066mMismatch Volume: 48m98mnfarction Location:None identified. IMPRESSION: I think there is an acute/ subacute infarction at the pontomedullary junction in the midline. This is below the region accurately evaluated by the perfusion exam. No medium or large vessel occlusion.  CT perfusion is negative. CT angiography does not appear different than the study of 02/04/2017. Atherosclerotic change of both carotid bifurcations but without flow limiting stenosis. Atherosclerotic change in both carotid siphon regions and supraclinoid internal carotid arteries. Supraclinoid ICA stenosis measures 50-70% on the right and approximately 50% on the left. Bilateral vertebral artery origin  stenoses estimated at 50% on the left and 70% on the right. Severe stenosis or occlusion of the right vertebral artery distal to PICA. Left vertebral artery sufficiently patent to the basilar. Basilar artery shows some narrowing and atherosclerotic irregularity. These results were called by telephone at the time of interpretation on 03/19/2017 at 1450 pm to Dr. MCNRoland Rackwho verbally acknowledged these results. Electronically Signed   By: MarNelson ChimesD.   On: 03/19/2017 15:24   Dg Chest Port 1 View  Result Date: 03/20/2017 CLINICAL DATA:  Myasthenia gravis crisis. Atrial fibrillation, diabetes, end-stage dialysis dependent renal failure. Acute respiratory failure. EXAM: PORTABLE CHEST 1 VIEW COMPARISON:  Portable chest x-ray of March 19, 2017 FINDINGS: The trachea and esophagus have been extubated. The lungs are well-expanded. There is no focal infiltrate. The left lower lobe is less dense. There is no pleural effusion or pneumothorax. The cardiac silhouette remains enlarged. The pulmonary vascularity is not engorged. The ICD is in stable position. The right internal jugular venous catheter tip projects over the midportion of the SVC. There is a vascular graft in the distal aspect of the right subclavian region. The bony thorax exhibits no acute abnormality. IMPRESSION: Interval extubation of the trachea and esophagus. Clearing of left basilar atelectasis or pneumonia. Mild cardiomegaly without pulmonary edema. The support tubes and devices are in stable position. Electronically Signed   By: David  JorMartiniqueD.   On: 03/20/2017 07:25   Dg Chest Port 1 View  Result Date: 03/19/2017 CLINICAL DATA:  Intubated.  Followup. EXAM: PORTABLE CHEST 1 VIEW COMPARISON:  03/18/2017 FINDINGS: Endotracheal tube tip is 6 cm above the carina. Nasogastric tube is present within the stomach. Pacemaker leads appear unchanged. Right internal jugular central line tip in the SVC 2 cm above the right atrium. Moderate  atelectasis persists in the left lower lobe. Mild atelectasis persists in the right lower lobe. Question developing  interstitial edema pattern. IMPRESSION: Lines and tubes satisfactory. Persistent lower lobe atelectasis left worse than right. Question developing edema pattern. Electronically Signed   By: Nelson Chimes M.D.   On: 03/19/2017 07:52   Dg Chest Port 1 View  Result Date: 03/18/2017 CLINICAL DATA:  Central line placement. EXAM: PORTABLE CHEST 1 VIEW COMPARISON:  03/18/2017 and prior studies FINDINGS: A right IJ central venous catheter is noted with tip overlying the lower SVC. There is no evidence of pneumothorax. An endotracheal tube with tip 5.2 cm above the carina, NG tube entering the stomach with tip off the field of view and left-sided pacemaker again noted. Bibasilar atelectasis, left-greater-than-right again noted. Mild pulmonary vascular congestion is present. A right subclavian stent is again identified. IMPRESSION: Right IJ central venous catheter with tip overlying the lower SVC. No evidence of pneumothorax. Support apparatus as described. Bibasilar atelectasis and mild pulmonary vascular congestion. Electronically Signed   By: Margarette Canada M.D.   On: 03/18/2017 22:11   Dg Chest Port 1 View  Result Date: 03/18/2017 CLINICAL DATA:  81 year old male dialysis patient with worsening generalized weakness, recently diagnosed with myasthenia gravis, worsening respiratory difficulty. Intubated. Hyperreflexia. Fever. EXAM: PORTABLE CHEST 1 VIEW COMPARISON:  Chest radiographs 03/17/2017 and earlier. Thoracic spine CT today reported separately. FINDINGS: Portable AP semi upright view at 1938 hours. Endotracheal tube tip projects over the tracheal air column at the level of the clavicles. Enteric tube courses the abdomen, tip not included. Mild veiling opacity at the left lung base. No pneumothorax or pulmonary edema. Stable cardiac size and mediastinal contours. Stable left chest dual lead cardiac  pacemaker. Allowing portable technique the right lung is clear right subclavian vascular stent. Surgical clips along the left chest wall. ACDF hardware again noted. IMPRESSION: 1. Endotracheal tube tip in good position. Enteric tube courses to the abdomen. 2. Small left pleural effusion. No other acute cardiopulmonary abnormality. Electronically Signed   By: Genevie Ann M.D.   On: 03/18/2017 20:11   Dg Abd Portable 1v  Result Date: 03/18/2017 CLINICAL DATA:  81 year old male dialysis patient with worsening generalized weakness, recently diagnosed with myasthenia gravis, worsening respiratory difficulty. Intubated. Oral enteric tube. Hyperreflexia. Fever. EXAM: PORTABLE ABDOMEN - 1 VIEW COMPARISON:  Portable chest at 1938 hours. Thoracic spine CT today reported separately. FINDINGS: Portable AP supine view at 1948 hours. Enteric tube courses to the abdomen and the side hole projects at the mid gastric body level. Bowel gas pattern is within normal limits. Stable lung bases. No acute osseous abnormality identified. IMPRESSION: Enteric tube side hole up the level of the gastric body. Non-obstructed bowel gas pattern. Electronically Signed   By: Genevie Ann M.D.   On: 03/18/2017 20:13   Medications: . sodium chloride    . sodium chloride    . fentaNYL infusion INTRAVENOUS Stopped (03/20/17 0215)  . phenylephrine (NEO-SYNEPHRINE) Adult infusion Stopped (03/20/17 0005)  . piperacillin-tazobactam (ZOSYN)  IV Stopped (03/20/17 0730)   . aspirin  325 mg Oral Daily  . chlorhexidine gluconate (MEDLINE KIT)  15 mL Mouth Rinse BID  . Chlorhexidine Gluconate Cloth  6 each Topical Daily  . diphenhydrAMINE  50 mg Intravenous Daily  . doxercalciferol  4 mcg Intravenous Q M,W,F-HD  . famotidine  20 mg Oral QHS  . feeding supplement (NEPRO CARB STEADY)  1,000 mL Per Tube Q24H  . feeding supplement (PRO-STAT SUGAR FREE 64)  30 mL Per Tube 5 X Daily  . Immune Globulin 10%  400 mg/kg Intravenous Q24 Hr  x 5  . insulin aspart   0-9 Units Subcutaneous Q4H  . ipratropium-albuterol  3 mL Nebulization Q6H  . mouth rinse  15 mL Mouth Rinse QID  . methylPREDNISolone (SOLU-MEDROL) injection  80 mg Intravenous Q24H  . metoprolol tartrate  2.5 mg Intravenous Q8H  . sodium chloride flush  10-40 mL Intracatheter Q12H  . sodium chloride flush  3 mL Intravenous Q12H

## 2017-03-20 NOTE — Evaluation (Signed)
Physical Therapy Evaluation Patient Details Name: James David MRN: [id number] DOB: 1936-01-24 Today's Date: 03/20/2017   History of Present Illness  81 y.o. male with medical history significant for diabetes mellitus 2 on oral agents, chronic kidney disease on dialysis, atrial fibrillation on chronic warfarin, anemia of chronic kidney disease, dyslipidemia. Recent admission for right subcortical stroke mid May 2018. Patient completed therapy in CIR and was discharged home at that time he was able to mobilize with the assistance of a rolling walker. Diagnosed  mid June with myasthenia gravis. Developed respiratory distress 7/3 and was intubated. Self extubated 7/5. CT - no sign of acute infarct.   Clinical Impression  Patient presents with decreased independence with mobility due to weakness, decreased sitting and standing balance, decreased activity tolerance, poor deficits awareness, decreased attention and will benefit from skilled PT in the acute setting to allow return home with wife assist following CIR level rehab.  Patient able to stand with mod A from elevated surface and side step to [facility] with RW, but previously independent with RW since d/c from inpatient rehab in May    Follow Up Recommendations CIR    Equipment Recommendations  None recommended by PT    Recommendations for Other Services       Precautions / Restrictions Precautions Precautions: Fall      Mobility  Bed Mobility Overal bed mobility: Needs Assistance Bed Mobility: Rolling;Sidelying to Sit Rolling: Supervision Sidelying to sit: Mod assist   Sit to supine: Mod assist   General bed mobility comments: rolled with rail and cues, assist for guiding legs off bed and lifting trunk; to supine assist for legs into bed  Transfers Overall transfer level: Needs assistance Equipment used: Rolling walker (2 wheeled) Transfers: Sit to/from Stand Sit to Stand: Mod assist;From elevated surface         General  transfer comment: cues for hand placement, assist for lifting  Ambulation/Gait Ambulation/Gait assistance: Mod assist Ambulation Distance (Feet): 2 Feet Assistive device: Rolling walker (2 wheeled) Gait Pattern/deviations: Step-to pattern;Decreased stride length;Trunk flexed     General Gait Details: three side steps up to head of bed with RW, assist for balance and walker management  Stairs            Wheelchair Mobility    Modified Rankin (Stroke Patients Only) Modified Rankin (Stroke Patients Only) Modified Rankin: Moderately severe disability     Balance Overall balance assessment: Needs assistance Sitting-balance support: Bilateral upper extremity supported;Feet supported Sitting balance-Leahy Scale: Poor Sitting balance - Comments: sat EOB x 15 minutes; initially mod support to prevent posterior LOB, progressed to S with frequent cues for balance EOB                                      Pertinent Vitals/Pain Pain Assessment: No/denies pain    Home Living Family/patient expects to be discharged to:: Private residence Living Arrangements: Spouse/significant other Available Help at Discharge: Family;Available 24 hours/day Type of Home: Other(Comment) Home Access: Level entry     Home Layout: One level Home Equipment: Grab bars - tub/shower      Prior Function Level of Independence: Independent with assistive device(s)         Comments: ambulating with RW since DC from CIR. going to outpt therapy     Hand Dominance   Dominant Hand: Right    Extremity/Trunk Assessment   Upper Extremity Assessment Upper Extremity  Assessment: Defer to OT evaluation    Lower Extremity Assessment Lower Extremity Assessment: RLE deficits/detail;LLE deficits/detail RLE Deficits / Details: AROM WFL, strength hip flexion 3+/5, knee extension 4/5 ankle DF 4-/5 LLE Deficits / Details: AROM WFL, strength hip flexion 3+/5, knee extension 4/5 ankle DF 4-/5     Cervical / Trunk Assessment Cervical / Trunk Assessment: Kyphotic  Communication   Communication: No difficulties  Cognition Arousal/Alertness: Awake/alert Behavior During Therapy: WFL for tasks assessed/performed Overall Cognitive Status: Impaired/Different from baseline                     Current Attention Level: Sustained   Following Commands: Follows one step commands consistently Safety/Judgement: Decreased awareness of deficits            General Comments      Exercises     Assessment/Plan    PT Assessment Patient needs continued PT services  PT Problem List Decreased strength;Decreased mobility;Decreased safety awareness;Decreased knowledge of precautions;Decreased cognition;Decreased activity tolerance;Decreased balance;Decreased knowledge of use of DME       PT Treatment Interventions DME instruction;Gait training;Therapeutic exercise;Patient/family education;Therapeutic activities;Balance training;Functional mobility training;Neuromuscular re-education    PT Goals (Current goals can be found in the Care Plan section)  Acute Rehab PT Goals Patient Stated Goal: to get better PT Goal Formulation: With patient/family Time For Goal Achievement: 04/03/17 Potential to Achieve Goals: Good    Frequency Min 4X/week   Barriers to discharge        Co-evaluation               AM-PAC PT "6 Clicks" Daily Activity  Outcome Measure Difficulty turning over in bed (including adjusting bedclothes, sheets and blankets)?: Total Difficulty moving from lying on back to sitting on the side of the bed? : Total Difficulty sitting down on and standing up from a chair with arms (e.g., wheelchair, bedside commode, etc,.)?: Total Help needed moving to and from a bed to chair (including a wheelchair)?: A Lot Help needed walking in hospital room?: Total Help needed climbing 3-5 steps with a railing? : Total 6 Click Score: 7    End of Session Equipment Utilized  During Treatment: Gait belt Activity Tolerance: Patient limited by fatigue Patient left: in bed;with call bell/phone within reach;with family/visitor present   PT Visit Diagnosis: Other abnormalities of gait and mobility (R26.89);Muscle weakness (generalized) (M62.81);Other symptoms and signs involving the nervous system (R29.898)    Time: 5397-6734 PT Time Calculation (min) (ACUTE ONLY): 32 min   Charges:   PT Evaluation $PT Eval Moderate Complexity: 1 Procedure PT Treatments $Therapeutic Activity: 8-22 mins   PT G CodesMagda Kiel, [place] [phone] 03/20/2017   Reginia Naas 03/20/2017, 5:26 PM

## 2017-03-21 ENCOUNTER — Inpatient Hospital Stay (HOSPITAL_COMMUNITY): Payer: Medicare Other

## 2017-03-21 LAB — BASIC METABOLIC PANEL
Anion gap: 11 (ref 5–15)
BUN: 63 mg/dL — AB (ref 6–20)
CHLORIDE: 95 mmol/L — AB (ref 101–111)
CO2: 24 mmol/L (ref 22–32)
Calcium: 8.4 mg/dL — ABNORMAL LOW (ref 8.9–10.3)
Creatinine, Ser: 6.28 mg/dL — ABNORMAL HIGH (ref 0.61–1.24)
GFR, EST AFRICAN AMERICAN: 9 mL/min — AB (ref >60)
GFR, EST NON AFRICAN AMERICAN: 8 mL/min — AB (ref >60)
Glucose, Bld: 219 mg/dL — ABNORMAL HIGH (ref 65–99)
POTASSIUM: 4.2 mmol/L (ref 3.5–5.1)
SODIUM: 130 mmol/L — AB (ref 135–145)

## 2017-03-21 LAB — PROTIME-INR
INR: 2.33
Prothrombin Time: 26 seconds — ABNORMAL HIGH (ref 11.4–15.2)

## 2017-03-21 LAB — PHOSPHORUS: PHOSPHORUS: 4.7 mg/dL — AB (ref 2.5–4.6)

## 2017-03-21 LAB — HEMOGLOBIN A1C
Hgb A1c MFr Bld: 5.7 % — ABNORMAL HIGH (ref 4.8–5.6)
Mean Plasma Glucose: 117 mg/dL

## 2017-03-21 LAB — MAGNESIUM: MAGNESIUM: 2.7 mg/dL — AB (ref 1.7–2.4)

## 2017-03-21 LAB — CULTURE, BLOOD (ROUTINE X 2): SPECIAL REQUESTS: ADEQUATE

## 2017-03-21 LAB — CBC
HCT: 23.4 % — ABNORMAL LOW (ref 39.0–52.0)
Hemoglobin: 7.3 g/dL — ABNORMAL LOW (ref 13.0–17.0)
MCH: 29.9 pg (ref 26.0–34.0)
MCHC: 31.2 g/dL (ref 30.0–36.0)
MCV: 95.9 fL (ref 78.0–100.0)
PLATELETS: 224 10*3/uL (ref 150–400)
RBC: 2.44 MIL/uL — AB (ref 4.22–5.81)
RDW: 14.2 % (ref 11.5–15.5)
WBC: 14.5 10*3/uL — ABNORMAL HIGH (ref 4.0–10.5)

## 2017-03-21 LAB — GLUCOSE, CAPILLARY
GLUCOSE-CAPILLARY: 254 mg/dL — AB (ref 65–99)
GLUCOSE-CAPILLARY: 364 mg/dL — AB (ref 65–99)
Glucose-Capillary: 181 mg/dL — ABNORMAL HIGH (ref 65–99)
Glucose-Capillary: 290 mg/dL — ABNORMAL HIGH (ref 65–99)
Glucose-Capillary: 98 mg/dL (ref 65–99)

## 2017-03-21 MED ORDER — LIDOCAINE-PRILOCAINE 2.5-2.5 % EX CREA: 1.0000 "application " | TOPICAL_CREAM | CUTANEOUS | Status: DC | PRN

## 2017-03-21 MED ORDER — WARFARIN - PHARMACIST DOSING INPATIENT
Freq: Every day | Status: DC
  Administered 2017-03-21 – 2017-03-22 (×2)

## 2017-03-21 MED ORDER — SODIUM CHLORIDE 0.9 % IV SOLN: 100.0000 mL | INTRAVENOUS | Status: DC | PRN

## 2017-03-21 MED ORDER — DEXTROSE 5 % IV SOLN
2.0000 g | INTRAVENOUS | Status: DC
  Administered 2017-03-21 – 2017-03-25 (×5): 2 g via INTRAVENOUS
  Filled 2017-03-21 (×5): qty 2

## 2017-03-21 MED ORDER — DOXERCALCIFEROL 4 MCG/2ML IV SOLN
INTRAVENOUS | Status: AC
  Administered 2017-03-21: 4 ug via INTRAVENOUS
  Filled 2017-03-21: qty 2

## 2017-03-21 MED ORDER — ALTEPLASE 2 MG IJ SOLR: 2.0000 mg | Freq: Once | INTRAMUSCULAR | Status: DC | PRN

## 2017-03-21 MED ORDER — PENTAFLUOROPROP-TETRAFLUOROETH EX AERO: 1.0000 "application " | INHALATION_SPRAY | CUTANEOUS | Status: DC | PRN

## 2017-03-21 MED ORDER — RENA-VITE PO TABS
1.0000 | ORAL_TABLET | Freq: Every day | ORAL | Status: DC
  Administered 2017-03-21 – 2017-03-25 (×5): 1 via ORAL
  Filled 2017-03-21 (×6): qty 1

## 2017-03-21 MED ORDER — IPRATROPIUM-ALBUTEROL 0.5-2.5 (3) MG/3ML IN SOLN: 3.0000 mL | Freq: Four times a day (QID) | RESPIRATORY_TRACT | Status: DC | PRN

## 2017-03-21 MED ORDER — METHYLPREDNISOLONE SODIUM SUCC 40 MG IJ SOLR
40.0000 mg | INTRAMUSCULAR | Status: DC
  Administered 2017-03-21 – 2017-03-23 (×3): 40 mg via INTRAVENOUS
  Filled 2017-03-21 (×3): qty 1

## 2017-03-21 MED ORDER — GABAPENTIN 100 MG PO CAPS
100.0000 mg | ORAL_CAPSULE | Freq: Three times a day (TID) | ORAL | Status: DC
  Administered 2017-03-21 – 2017-03-26 (×15): 100 mg via ORAL
  Filled 2017-03-21 (×15): qty 1

## 2017-03-21 MED ORDER — CARVEDILOL 3.125 MG PO TABS
3.1250 mg | ORAL_TABLET | Freq: Two times a day (BID) | ORAL | Status: DC
  Administered 2017-03-21 – 2017-03-26 (×9): 3.125 mg via ORAL
  Filled 2017-03-21 (×10): qty 1

## 2017-03-21 MED ORDER — LIDOCAINE HCL (PF) 1 % IJ SOLN: 5.0000 mL | INTRAMUSCULAR | Status: DC | PRN

## 2017-03-21 MED ORDER — HEPARIN SODIUM (PORCINE) 1000 UNIT/ML DIALYSIS: 1000.0000 [IU] | INTRAMUSCULAR | Status: DC | PRN

## 2017-03-21 MED ORDER — PRO-STAT SUGAR FREE PO LIQD
30.0000 mL | Freq: Two times a day (BID) | ORAL | Status: DC
  Administered 2017-03-21 – 2017-03-26 (×10): 30 mL via ORAL
  Filled 2017-03-21 (×10): qty 30

## 2017-03-21 MED ORDER — WARFARIN SODIUM 1 MG PO TABS
1.0000 mg | ORAL_TABLET | Freq: Once | ORAL | Status: AC
  Administered 2017-03-21: 1 mg via ORAL
  Filled 2017-03-21 (×2): qty 1

## 2017-03-21 MED ORDER — SODIUM CHLORIDE 0.9 % IV SOLN: Freq: Once | INTRAVENOUS | Status: DC

## 2017-03-21 MED ORDER — NEPRO/CARBSTEADY PO LIQD
237.0000 mL | Freq: Two times a day (BID) | ORAL | Status: DC
  Administered 2017-03-21 – 2017-03-26 (×8): 237 mL via ORAL
  Filled 2017-03-21 (×13): qty 237

## 2017-03-21 NOTE — Procedures (Signed)
I have personally attended this patient's dialysis session.   Transfused 1 unit PRBC's for 7.2 R aVF no issues No heparin Change to 2K bath K 4.2  [name], MD [facility] [phone] Pager 03/21/2017, 2:45 PM

## 2017-03-21 NOTE — Progress Notes (Signed)
Inpatient Diabetes Program Recommendations  AACE/ADA: New Consensus Statement on Inpatient Glycemic Control (2015)  Target Ranges:  Prepandial:   less than 140 mg/dL      Peak postprandial:   less than 180 mg/dL (1-2 hours)      Critically ill patients:  140 - 180 mg/dL   Results for ANDRUS, SHARP (MRN [id number]) as of 03/21/2017 07:37  Ref. Range 03/20/2017 04:07 03/20/2017 07:45 03/20/2017 11:56 03/20/2017 15:46 03/20/2017 19:51 03/21/2017 00:13 03/21/2017 04:05  Glucose-Capillary Latest Ref Range: 65 - 99 mg/dL 288 (H) 267 (H) 203 (H) 239 (H) 357 (H) 290 (H) 254 (H)   Review of Glycemic Control  Diabetes history: DM 2 Outpatient Diabetes medications: Amaryl 1 mg Daily Current orders for Inpatient glycemic control: Novolog Sensitive Q4hours  Inpatient Diabetes Program Recommendations:    Patient receiving IV Solumedrol 80 mg Q24 hours. Glucose in the 200's. Consider Low dose basal insulin Levemir 8-10 units (0.1 units/kg).  Thanks,  Tama Headings RN, MSN, [facility] Inpatient Diabetes Coordinator Team Pager [phone] (8a-5p)

## 2017-03-21 NOTE — Progress Notes (Signed)
PT Cancellation Note  Patient Details Name: IVIE MAESE MRN: [id number] DOB: 1936-05-20   Cancelled Treatment:    Reason Eval/Treat Not Completed: Patient at procedure or test/unavailable; pt in HD.  Will attempt later if time permits.   Reginia Naas 03/21/2017, 12:30 PM  Magda Kiel, [place] 03/21/2017

## 2017-03-21 NOTE — Progress Notes (Signed)
ANTICOAGULATION CONSULT NOTE - Initial Consult  Pharmacy Consult for Warfarin Indication: atrial fibrillation  Allergies  Allergen Reactions  . Amlodipine Besylate Swelling    Angioedema   . Lisinopril Other (See Comments)    Renal insufficiency  . Statins Other (See Comments)    Muscle aches  . Ropinirole Other (See Comments)    Causes excessive grogginess the following morning after taking  . Tape Other (See Comments)    SKIN IS THIN AND TAPE WILL ACTUALLY TEAR IT; PLEASE USE COBAN WRAP INSTEAD!!    Patient Measurements: Height: 5' 10"  (177.8 cm) Weight: 176 lb 5.9 oz (80 kg) IBW/kg (Calculated) : 73  Vital Signs: Temp: 97.8 F (36.6 C) (07/06 0747) Temp Source: Oral (07/06 0747) BP: 142/57 (07/06 0800) Pulse Rate: 65 (07/06 0800)  Labs:  Recent Labs  03/19/17 0315 03/20/17 0500 03/21/17 0545  HGB 7.3* 7.3* 7.3*  HCT 23.0* 22.8* 23.4*  PLT 192 219 224  LABPROT 21.1* 19.4* 26.0*  INR 1.79 1.62 2.33  CREATININE 5.45* 4.26* 6.28*    Estimated Creatinine Clearance: 10 mL/min (A) (by C-G formula based on SCr of 6.28 mg/dL (H)).   Medical History: Past Medical History:  Diagnosis Date  . Atrial fibrillation ([place])   . Chronic kidney disease, stage IV (severe) ([facility])    baseline creatinine 2-3  . DDD (degenerative disc disease)   . Diabetes mellitus without complication ([place])   . ESRD (end stage renal disease) on dialysis ([place])   . Hyperlipidemia   . Hypertension   . Melanoma ([place]) 2010   rem[facility] at [facility]  . Second degree heart block    a. s/p STJ dual chamber PPM - Dr [name]  . Stroke [facility]) 01/2017    Medications:  Prescriptions Prior to Admission  Medication Sig Dispense Refill Last Dose  . acetaminophen (TYLENOL) 500 MG tablet Take 500 mg by mouth every 8 (eight) hours as needed for mild pain. (pain/headaches)   PRN at PRN  . aspirin EC 81 MG tablet Take 81 mg by mouth at bedtime.   03/16/2017 at 2000  . calcium acetate (PHOSLO) 667 MG capsule Take 2  capsules (1,334 mg total) by mouth 3 (three) times daily. (Patient taking differently: Take 667-1,334 mg by mouth See admin instructions. 1,334 mg three times a day after meals and 667 mg with each snack) 180 capsule 0 03/17/2017 at am  . carvedilol (COREG) 3.125 MG tablet Take 1 tablet (3.125 mg total) by mouth 2 (two) times daily with a meal. 60 tablet 1 03/17/2017 at 1100  . ezetimibe (ZETIA) 10 MG tablet Take 1 tablet (10 mg total) by mouth daily. 30 tablet 1 03/17/2017 at am  . fenofibrate 160 MG tablet Take 1 tablet (160 mg total) by mouth daily. 30 tablet 0 03/17/2017 at am  . finasteride (PROSCAR) 5 MG tablet Take 1 tablet (5 mg total) by mouth daily. 30 tablet 1 03/17/2017 at am  . gabapentin (NEURONTIN) 100 MG capsule Take 1 capsule (100 mg total) by mouth at bedtime. 30 capsule 1 03/16/2017 at pm  . glimepiride (AMARYL) 1 MG tablet Take 1 tablet (1 mg total) by mouth daily with breakfast. 30 tablet 1 03/17/2017 at am  . Hypromellose (ARTIFICIAL TEARS OP) Place 1 drop into both eyes 3 (three) times daily as needed (for dry eyes).    PRN at PRN  . ipratropium (ATROVENT) 0.03 % nasal spray Place 2 sprays into both nostrils daily as needed for rhinitis.    PRN at  PRN  . Multiple Vitamin (DAILY-VITE PO) Take 1 tablet by mouth at bedtime. WITH ZINC   03/16/2017 at pm  . pyridostigmine (MESTINON) 60 MG tablet Take 1/2 to 1 pill po tid (Patient taking differently: Take 30 mg by mouth 2 (two) times daily. BREAKFAST AND SUPPERTIME) 90 tablet 5 03/17/2017 at AM  . warfarin (COUMADIN) 1 MG tablet 1 1/2 tab daily (Patient taking differently: Take 1-1.5 mg by mouth See admin instructions. 1.5 mg with supper on Sun/Tues/Thurs/Sat and 1 mg on Mon/Wed/Fri) 30 tablet 1 Past Week at 1800  . multivitamin (RENA-VIT) TABS tablet Take 1 tablet by mouth at bedtime. (Patient not taking: Reported on 03/17/2017) 30 tablet 0 Not Taking at Unknown time  . rOPINIRole (REQUIP) 0.5 MG tablet Take 1 tablet (0.5 mg total) by mouth at bedtime.  (Patient not taking: Reported on 03/17/2017) 30 tablet 1 Not Taking at Unknown time  . senna-docusate (SENOKOT-S) 8.6-50 MG tablet Take 2 tablets by mouth at bedtime. (Patient not taking: Reported on 03/04/2017)   Not Taking at Unknown time   Scheduled:  . aspirin  325 mg Oral Daily  . carvedilol  3.125 mg Oral BID WC  . chlorhexidine gluconate (MEDLINE KIT)  15 mL Mouth Rinse BID  . Chlorhexidine Gluconate Cloth  6 each Topical Daily  . diphenhydrAMINE  50 mg Intravenous Daily  . doxercalciferol  4 mcg Intravenous Q M,W,F-HD  . famotidine  20 mg Oral QHS  . gabapentin  100 mg Oral TID  . Immune Globulin 10%  400 mg/kg Intravenous Q24 Hr x 5  . insulin aspart  0-9 Units Subcutaneous Q4H  . mouth rinse  15 mL Mouth Rinse QID  . methylPREDNISolone (SOLU-MEDROL) injection  40 mg Intravenous Q24H  . sodium chloride flush  10-40 mL Intracatheter Q12H  . sodium chloride flush  3 mL Intravenous Q12H    Assessment: Pt on chronic warfarin PTA for afib, last dose taken > 5 days ago. Warfarin discontinued on admission for INR 5.9 and hemoccult positive. Given vitamin K, INR 2.33 today. Hgb low but stable, 7.3. Plt WNL.  Warfarin PTA regimen: 1.5 mg Su/Tu/Th/Sa 1 mg M/W/F  Goal of Therapy:  INR 2-3 Monitor platelets by anticoagulation protocol: Yes   Plan:  Warfarin 1 mg x1 (recent bleed). Daily INR, CBC, monitor for s/sx of bleeding.  Mila Merry Gerarda Fraction, PharmD PGY1 Pharmacy Resident 03/21/2017,9:54 AM

## 2017-03-21 NOTE — Progress Notes (Signed)
Nutrition Follow-up  DOCUMENTATION CODES:   Severe malnutrition in context of chronic illness  INTERVENTION:   Nepro Shake po BID, each supplement provides 425 kcal and 19 grams protein  30 ml Prostat BID  NUTRITION DIAGNOSIS:   Malnutrition related to chronic illness (ESRD on HD) as evidenced by severe depletion of muscle mass, 7 percent weight loss x 2 months. Ongoing.   GOAL:   Patient will meet greater than or equal to 90% of their needs Progressing.   MONITOR:   TF tolerance, I & O's, Labs, Vent status  ASSESSMENT:   Pt with PMH of ESRD on HD since 01/21/17, DM, HLD, and several recent hospitalizations. Mostly recently d/c'ed from rehab 6/20 after CVA/UTI. Pt newly dx with myasthenia gravis failed Mestinon d/t severe diarrhea. Admitted 7/2 for progressive weakness, MG crisis, and sepsis. Intubated plan for IVIG.   Pt self-extubated Pt seen in HD. Per pt after leaving rehab he went home and had a poor appetite and was eating very little. He endorses weight loss but unsure of how much. He has had Nepro before and was drinking these PTA.  Steroids tapering, finishing IVIG Labs reviewed: PO4 4.7 (H), Magnesium 2.7 (H) CBG's: [phone]   Diet Order:  DIET DYS 2 Room service appropriate? Yes; Fluid consistency: Thin  Skin:   (stage II buttocks)  Last BM:  7/5 smear  Height:   Ht Readings from Last 1 Encounters:  03/18/17 5\' 10"  (1.778 m)    Weight:   Wt Readings from Last 1 Encounters:  03/21/17 176 lb 5.9 oz (80 kg)    Ideal Body Weight:  75.4 kg  BMI:  Body mass index is 25.31 kg/m.  Estimated Nutritional Needs:   Kcal:  2482-5003  Protein:  110-120 grams  Fluid:  1.2 L/day  EDUCATION NEEDS:   No education needs identified at this time  [place], [place], [place] Pager [phone] After Hours Pager

## 2017-03-21 NOTE — Progress Notes (Signed)
NIF: -30, VC: 1L with good effort.

## 2017-03-21 NOTE — Progress Notes (Signed)
Dialysis treatment completed.  2500 mL ultrafiltrated and net fluid removal 1500 mL.    Patient status unchanged. Lung sounds diminished to ausculation in all fields. Generalized edema. Cardiac: NSR.  Disconnected lines and removed needles.  Pressure held for 10 minutes and band aid/gauze dressing applied.  Report given to bedside RN, Matt.

## 2017-03-21 NOTE — Progress Notes (Signed)
SLP Cancellation Note  Patient Details Name: JERE BOSTROM MRN: [id number] DOB: [birth year]   Cancelled treatment:       Reason Eval/Treat Not Completed: Patient at procedure or test/unavailable (HD). Will f/u as able.   Germain Osgood 03/21/2017, 11:06 AM  Germain Osgood, M.A. CCC-SLP [phone]

## 2017-03-21 NOTE — Progress Notes (Signed)
Subjective: Much better, extubated  Exam: Vitals:   03/21/17 0747 03/21/17 0800  BP:  (!) 142/57  Pulse:  65  Resp:  11  Temp: 97.8 F (36.6 C)    Gen: In bed, NAD Resp: non-labored breathing, no acute distress Abd: soft, nt  Neuro: MS: awake, alert, follows commands CN: PERRL, does not blink to threat from either side, left gaze preference.  Motor: good 5/5 strength through, possibly mild right arm weakness Sensory:responds to nox stim x 4.   Pertinent Labs: BMP - elevated creatinine.   Impression: 81 year old male with subjective progressive generalized weakness in the setting of recent diagnosis of myasthenia gravis and gram negative sepsis. His hyperreflexia could be a sequela of his previous cervical injury, though current myelopathy would also be a consideration. When he  had a decompensation, this appeared worse, but now I suspect that this was being driven by his sepsis since he markedly improved after treatment. This could have been the driver of all of his acute worsening as infection can certainly cause myasthenia flare.   With resolution of the symptoms that prompted his head CT, I wonder fi the finding in the brainstem is real or artifactual. I think it would be prudent to assume it is real, but it is small enough that I don't think it precludes anticoagulation.   LDL 37 ok, Echo without embolic source. CTA negative.  Given that I suspect an infectious driver for his condition worsening,  I am hesitant to start steroids at this time.    Recommendations: 1) Ok to restart anticoagulation for secondary stroke eprevention 2) IVIG day #5 today 3) PT, OT 4) treatment of gram negative infection per CCM  Roland Rack, MD [facility] [phone]  If 7pm- 7am, please page neurology on call as listed in [place].

## 2017-03-21 NOTE — Progress Notes (Signed)
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  Objective Vitals:   03/21/17 1200 03/21/17 1230 03/21/17 1300 03/21/17 1330  BP: 139/67 (!) 143/69 127/65 130/62  Pulse: 67 66 75 62  Resp:      Temp: 98 F (36.7 C)     TempSrc:      SpO2:      Weight:      Height:       Physical Exam Sats good on [place] Regular bradycardia 60's S1S2 No S3 No audible murmur Lungs clear anteriorly Abd soft No LE edema whatsoever SCD's in plance R upper AVF cannulated Clear dressing over large blood blister/?skin tear   Recent Labs Lab 03/19/17 0315 03/19/17 1658 03/20/17 0500 03/21/17 0545  NA 132*  --  131* 130*  K 4.6  --  3.9 4.2  CL 96*  --  96* 95*  CO2 24  --  25 24  GLUCOSE 211*  --  270* 219*  BUN 23*  --  40* 63*  CREATININE 5.45*  --  4.26* 6.28*  CALCIUM 7.6*  --  7.9* 8.4*  PHOS 4.6 3.9 4.6 4.7*     Recent Labs Lab 03/17/17 1257 03/18/17 0245 03/20/17 0500  AST 17 12*  --   ALT 15* 14*  --   ALKPHOS 34* 33*  --   BILITOT 0.7 0.7  --   PROT 5.5* 5.9*  --   ALBUMIN 3.0* 2.7* 2.1*    Recent Labs Lab 03/17/17 1257 03/18/17 0245 03/19/17 0315 03/20/17 0500 03/21/17 0545  WBC 10.2 6.5 16.2* 15.0* 14.5*  NEUTROABS 8.8*  --   --   --   --   HGB 8.1* 7.8* 7.3* 7.3* 7.3*  HCT 25.5* 24.2* 23.0* 22.8* 23.4*  MCV 96.2 95.3 97.5 97.0 95.9  PLT 184 198 192 219 224   Blood Culture    Component Value Date/Time   SDES BLOOD LEFT ARM 03/18/2017 1017   SDES BLOOD LEFT HAND 03/18/2017 1017   SPECREQUEST IN PEDIATRIC BOTTLE Blood Culture adequate volume 03/18/2017 1017   SPECREQUEST IN PEDIATRIC BOTTLE Blood Culture adequate volume 03/18/2017 1017   CULT ESCHERICHIA COLI (A) 03/18/2017 1017   CULT NO GROWTH 3 DAYS 03/18/2017 1017   REPTSTATUS 03/21/2017 FINAL 03/18/2017 1017   REPTSTATUS PENDING 03/18/2017 1017     Recent Labs Lab 03/20/17 1546  03/20/17 1951 03/21/17 0013 03/21/17 0405 03/21/17 0746  GLUCAP 239* 357* 290* 254* 181*   Iron Studies: No results for input(s): IRON, TIBC, TRANSFERRIN, FERRITIN in the last 72 hours. Lab Results  Component Value Date   INR 2.33 03/21/2017   INR 1.62 03/20/2017   INR 1.79 03/19/2017   Studies/Results: Ct Angio Head W Or Wo Contrast  Result Date: 03/19/2017 CLINICAL DATA:  Gaze palsy. Right-sided weakness beginning yesterday. EXAM: CT ANGIOGRAPHY HEAD AND NECK CT PERFUSION BRAIN TECHNIQUE: Multidetector CT imaging of the head and neck was performed using the standard protocol during bolus administration of intravenous contrast. Multiplanar CT image reconstructions and MIPs were obtained to evaluate the vascular anatomy. Carotid stenosis measurements (when applicable) are obtained utilizing NASCET criteria, using the distal internal carotid diameter as the denominator. Multiphase CT imaging of the brain was performed following IV bolus contrast injection. Subsequent parametric perfusion maps were calculated using RAPID software. CONTRAST:  90 cc Isovue 370 COMPARISON:  CT earlier same day.  CT angiography 02/04/2017.  FINDINGS: CTA NECK FINDINGS Aortic arch: Aortic atherosclerosis. No dissection. Branching pattern of the brachiocephalic vessels from the arch is normal without origin stenosis. Right carotid system: Common carotid artery widely patent to the bifurcation region. Atherosclerotic calcification of the carotid bifurcation but no stenosis. Cervical ICA widely patent to the skullbase. Left carotid system: Common carotid artery widely patent to the bifurcation. Atherosclerotic calcification of the carotid bifurcation. 20% stenosis of the proximal ICA. Wide patency beyond that. Vertebral arteries: There is atherosclerotic disease of both vertebral artery origins with stenosis estimated at 50% on the left and 70% on the right. Both vessels do show flow through the cervical region to the foramen  magnum. Skeleton: Previous ACDF C5-6. Mild spondylosis above and below that. 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 Thyroid nodules unchanged since previous. Upper chest: Negative Review of the MIP images confirms the above findings CTA HEAD FINDINGS Anterior circulation: Both internal carotid arteries are patent through the skullbase and siphon regions. Atherosclerotic calcification in the siphon regions but no stenosis greater than 30%. Supraclinoid internal carotid arteries are stenotic, 50-70% on the right and 50% on the left. Anterior and middle cerebral vessels are patent without correctable proximal stenosis. Moderate narrowing A1 segment on the right. No large vessel occlusion. Posterior circulation: Both vertebral arteries are patent at the foramen magnum. The right vertebral artery is occluded or severely stenotic past PICA. Left vertebral artery is patent to the basilar. Basilar artery shows mild narrowing and irregularity but no focal or correctable stenosis. Posterior circulation branch vessels are patent. Left PCA arises from the anterior circulation. Venous sinuses: Patent and normal Anatomic variants: None significant Delayed phase: No abnormal enhancement. There is low-density in the midline at the pontomedullary junction that I think is a real finding an was present on the immediate prior exam. This probably represents a brainstem infarction. Review of the MIP images confirms the above findings CT Brain Perfusion Findings: CBF (<30%) Volume: 33m Perfusion (Tmax>6.0s) volume: 01mMismatch Volume: 25m14mnfarction Location:None identified. IMPRESSION: I think there is an acute/ subacute infarction at the pontomedullary junction in the midline. This is below the region accurately evaluated by the perfusion exam. No medium or large vessel occlusion.  CT perfusion is negative. CT angiography does not appear different than the study of 02/04/2017.  Atherosclerotic change of both carotid bifurcations but without flow limiting stenosis. Atherosclerotic change in both carotid siphon regions and supraclinoid internal carotid arteries. Supraclinoid ICA stenosis measures 50-70% on the right and approximately 50% on the left. Bilateral vertebral artery origin stenoses estimated at 50% on the left and 70% on the right. Severe stenosis or occlusion of the right vertebral artery distal to PICA. Left vertebral artery sufficiently patent to the basilar. Basilar artery shows some narrowing and atherosclerotic irregularity. These results were called by telephone at the time of interpretation on 03/19/2017 at 1450 pm to Dr. MCNRoland Rackwho verbally acknowledged these results. Electronically Signed   By: MarNelson ChimesD.   On: 03/19/2017 15:24   Ct Angio Neck W Or Wo Contrast  Result Date: 03/19/2017 CLINICAL DATA:  Gaze palsy. Right-sided weakness beginning yesterday. EXAM: CT ANGIOGRAPHY HEAD AND NECK CT PERFUSION BRAIN TECHNIQUE: Multidetector CT imaging of the head and neck was performed using the standard protocol during bolus administration of intravenous contrast. Multiplanar CT image reconstructions and MIPs were obtained to evaluate the vascular anatomy. Carotid stenosis measurements (when applicable) are obtained utilizing NASCET criteria, using the distal  internal carotid diameter as the denominator. Multiphase CT imaging of the brain was performed following IV bolus contrast injection. Subsequent parametric perfusion maps were calculated using RAPID software. CONTRAST:  90 cc Isovue 370 COMPARISON:  CT earlier same day.  CT angiography 02/04/2017. FINDINGS: CTA NECK FINDINGS Aortic arch: Aortic atherosclerosis. No dissection. Branching pattern of the brachiocephalic vessels from the arch is normal without origin stenosis. Right carotid system: Common carotid artery widely patent to the bifurcation region. Atherosclerotic calcification of the carotid  bifurcation but no stenosis. Cervical ICA widely patent to the skullbase. Left carotid system: Common carotid artery widely patent to the bifurcation. Atherosclerotic calcification of the carotid bifurcation. 20% stenosis of the proximal ICA. Wide patency beyond that. Vertebral arteries: There is atherosclerotic disease of both vertebral artery origins with stenosis estimated at 50% on the left and 70% on the right. Both vessels do show flow through the cervical region to the foramen magnum. Skeleton: Previous ACDF C5-6. Mild spondylosis above and below that. 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 Thyroid nodules unchanged since previous. Upper chest: Negative Review of the MIP images confirms the above findings CTA HEAD FINDINGS Anterior circulation: Both internal carotid arteries are patent through the skullbase and siphon regions. Atherosclerotic calcification in the siphon regions but no stenosis greater than 30%. Supraclinoid internal carotid arteries are stenotic, 50-70% on the right and 50% on the left. Anterior and middle cerebral vessels are patent without correctable proximal stenosis. Moderate narrowing A1 segment on the right. No large vessel occlusion. Posterior circulation: Both vertebral arteries are patent at the foramen magnum. The right vertebral artery is occluded or severely stenotic past PICA. Left vertebral artery is patent to the basilar. Basilar artery shows mild narrowing and irregularity but no focal or correctable stenosis. Posterior circulation branch vessels are patent. Left PCA arises from the anterior circulation. Venous sinuses: Patent and normal Anatomic variants: None significant Delayed phase: No abnormal enhancement. There is low-density in the midline at the pontomedullary junction that I think is a real finding an was present on the immediate prior exam. This probably represents a brainstem infarction. Review of the MIP images  confirms the above findings CT Brain Perfusion Findings: CBF (<30%) Volume: 39m Perfusion (Tmax>6.0s) volume: 074mMismatch Volume: 86m66mnfarction Location:None identified. IMPRESSION: I think there is an acute/ subacute infarction at the pontomedullary junction in the midline. This is below the region accurately evaluated by the perfusion exam. No medium or large vessel occlusion.  CT perfusion is negative. CT angiography does not appear different than the study of 02/04/2017. Atherosclerotic change of both carotid bifurcations but without flow limiting stenosis. Atherosclerotic change in both carotid siphon regions and supraclinoid internal carotid arteries. Supraclinoid ICA stenosis measures 50-70% on the right and approximately 50% on the left. Bilateral vertebral artery origin stenoses estimated at 50% on the left and 70% on the right. Severe stenosis or occlusion of the right vertebral artery distal to PICA. Left vertebral artery sufficiently patent to the basilar. Basilar artery shows some narrowing and atherosclerotic irregularity. These results were called by telephone at the time of interpretation on 03/19/2017 at 1450 pm to Dr. MCNRoland Rackwho verbally acknowledged these results. Electronically Signed   By: MarNelson ChimesD.   On: 03/19/2017 15:24   Ct Cerebral Perfusion W Contrast  Result Date: 03/19/2017 CLINICAL DATA:  Gaze palsy. Right-sided weakness beginning yesterday. EXAM: CT ANGIOGRAPHY HEAD AND NECK CT PERFUSION BRAIN TECHNIQUE: Multidetector CT imaging of  the head and neck was performed using the standard protocol during bolus administration of intravenous contrast. Multiplanar CT image reconstructions and MIPs were obtained to evaluate the vascular anatomy. Carotid stenosis measurements (when applicable) are obtained utilizing NASCET criteria, using the distal internal carotid diameter as the denominator. Multiphase CT imaging of the brain was performed following IV bolus contrast  injection. Subsequent parametric perfusion maps were calculated using RAPID software. CONTRAST:  90 cc Isovue 370 COMPARISON:  CT earlier same day.  CT angiography 02/04/2017. FINDINGS: CTA NECK FINDINGS Aortic arch: Aortic atherosclerosis. No dissection. Branching pattern of the brachiocephalic vessels from the arch is normal without origin stenosis. Right carotid system: Common carotid artery widely patent to the bifurcation region. Atherosclerotic calcification of the carotid bifurcation but no stenosis. Cervical ICA widely patent to the skullbase. Left carotid system: Common carotid artery widely patent to the bifurcation. Atherosclerotic calcification of the carotid bifurcation. 20% stenosis of the proximal ICA. Wide patency beyond that. Vertebral arteries: There is atherosclerotic disease of both vertebral artery origins with stenosis estimated at 50% on the left and 70% on the right. Both vessels do show flow through the cervical region to the foramen magnum. Skeleton: Previous ACDF C5-6. Mild spondylosis above and below that. 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 Thyroid nodules unchanged since previous. Upper chest: Negative Review of the MIP images confirms the above findings CTA HEAD FINDINGS Anterior circulation: Both internal carotid arteries are patent through the skullbase and siphon regions. Atherosclerotic calcification in the siphon regions but no stenosis greater than 30%. Supraclinoid internal carotid arteries are stenotic, 50-70% on the right and 50% on the left. Anterior and middle cerebral vessels are patent without correctable proximal stenosis. Moderate narrowing A1 segment on the right. No large vessel occlusion. Posterior circulation: Both vertebral arteries are patent at the foramen magnum. The right vertebral artery is occluded or severely stenotic past PICA. Left vertebral artery is patent to the basilar. Basilar artery shows mild narrowing  and irregularity but no focal or correctable stenosis. Posterior circulation branch vessels are patent. Left PCA arises from the anterior circulation. Venous sinuses: Patent and normal Anatomic variants: None significant Delayed phase: No abnormal enhancement. There is low-density in the midline at the pontomedullary junction that I think is a real finding an was present on the immediate prior exam. This probably represents a brainstem infarction. Review of the MIP images confirms the above findings CT Brain Perfusion Findings: CBF (<30%) Volume: 14m Perfusion (Tmax>6.0s) volume: 031mMismatch Volume: 48m4mnfarction Location:None identified. IMPRESSION: I think there is an acute/ subacute infarction at the pontomedullary junction in the midline. This is below the region accurately evaluated by the perfusion exam. No medium or large vessel occlusion.  CT perfusion is negative. CT angiography does not appear different than the study of 02/04/2017. Atherosclerotic change of both carotid bifurcations but without flow limiting stenosis. Atherosclerotic change in both carotid siphon regions and supraclinoid internal carotid arteries. Supraclinoid ICA stenosis measures 50-70% on the right and approximately 50% on the left. Bilateral vertebral artery origin stenoses estimated at 50% on the left and 70% on the right. Severe stenosis or occlusion of the right vertebral artery distal to PICA. Left vertebral artery sufficiently patent to the basilar. Basilar artery shows some narrowing and atherosclerotic irregularity. These results were called by telephone at the time of interpretation on 03/19/2017 at 1450 pm to Dr. MCNRoland Rackwho verbally acknowledged these results. Electronically Signed   By: MarElta [place]  Shogry M.D.   On: 03/19/2017 15:24   Dg Chest Port 1 View  Result Date: 03/21/2017 CLINICAL DATA:  Respiratory failure. EXAM: PORTABLE CHEST 1 VIEW COMPARISON:  03/20/2017. FINDINGS: Right IJ line in stable position.  Cardiac pacer with lead tips in right atrium and right ventricle. Cardiomegaly. Normal pulmonary vascularity. Low lung volumes with basilar atelectasis. Mild basilar infiltrates cannot be excluded . Small left pleural effusion cannot be excluded. No pneumothorax. Contrast noted in the stomach. Surgical clips noted over the left chest. Vascular stent noted over the right subclavian region. Prior cervical spine fusion . IMPRESSION: 1. Right IJ line stable position. 2. Stable cardiomegaly.  Cardiac pacer stable position. 3. Low lung volumes with basilar atelectasis. Mild left base infiltrate cannot be excluded. Small left pleural effusion cannot be excluded . Electronically Signed   By: [name]  Register   On: 03/21/2017 07:25   Dg Chest Port 1 View  Result Date: 03/20/2017 CLINICAL DATA:  Myasthenia gravis crisis. Atrial fibrillation, diabetes, end-stage dialysis dependent renal failure. Acute respiratory failure. EXAM: PORTABLE CHEST 1 VIEW COMPARISON:  Portable chest x-ray of March 19, 2017 FINDINGS: The trachea and esophagus have been extubated. The lungs are well-expanded. There is no focal infiltrate. The left lower lobe is less dense. There is no pleural effusion or pneumothorax. The cardiac silhouette remains enlarged. The pulmonary vascularity is not engorged. The ICD is in stable position. The right internal jugular venous catheter tip projects over the midportion of the SVC. There is a vascular graft in the distal aspect of the right subclavian region. The bony thorax exhibits no acute abnormality. IMPRESSION: Interval extubation of the trachea and esophagus. Clearing of left basilar atelectasis or pneumonia. Mild cardiomegaly without pulmonary edema. The support tubes and devices are in stable position. Electronically Signed   By: David  Martinique M.D.   On: 03/20/2017 07:25   Dg Swallowing Func-speech Pathology  Result Date: 03/20/2017 Objective Swallowing Evaluation: Type of Study: MBS-Modified Barium  Swallow Study Patient Details Name: DYON ROTERT MRN: [id number] Date of Birth: Aug 08, 1936 Today's Date: 03/20/2017 Time: SLP Start Time (ACUTE ONLY): 1409-SLP Stop Time (ACUTE ONLY): 1425 SLP Time Calculation (min) (ACUTE ONLY): 16 min Past Medical History: Past Medical History: Diagnosis Date . Atrial fibrillation ([place])  . Chronic kidney disease, stage IV (severe) (H[facility])   baseline creatinine 2-3 . DDD (degenerative disc disease)  . Diabetes mellitus without complication ([place])  . ESRD (end stage renal disease) on dialysis ([place])  . Hyperlipidemia  . Hypertension  . Melanoma ([place]) 2010  removed at [facility] . Second degree heart block   a. s/p STJ dual chamber PPM - Dr [name] . Stroke [facility]) 01/2017 Past Surgical History: Past Surgical History: Procedure Laterality Date . AV FISTULA PLACEMENT Right 05/31/2014  Procedure: Right Arm Brachiocephalic ARTERIOVENOUS FISTULA CREATION  ;  Surgeon: Conrad Francis, MD;  Location: [facility];  Service: Vascular;  Laterality: Right; . CATARACT EXTRACTION W/ INTRAOCULAR LENS  IMPLANT, BILATERAL   . COLONOSCOPY   . EXCISION MELANOMA WITH SENTINEL LYMPH NODE BIOPSY Right 02/08/2016  Procedure: WIDE EXCISION RIGHT SHOULDER MELANOMA WITH RIGHT SENTINEL LYMPH NODE BIOPSY;  Surgeon: [name], MD;  Location: [place];  Service: General;  Laterality: Right; . LAMINECTOMY   . PERMANENT PACEMAKER INSERTION N/A 01/20/2014  STJ Assurity dual chamber pacemaker implanted by Dr [name] for 2nd degree AV block HPI: Pt is a 81 y.o.maleadmitted for possible myasthenia gravis crisis with increasing LE weakness and difficulty swallowing, although without coughing per H&P  note. He had worsening respiratory status requiring intubation 7/3. New focal deficits inlcuding R sided weakness were noted on 7/4. CT perfusion study is concerning for an acute/subacute infarction at the pontomedullary junction in the midline. Pt self-extubated in the early morning 7/5. PMH includes ACDF (C5-C6), ESRD, DM, HLD, recent CVA,  recently dx MG, second degree heart block, melanoma, DDD, a fib Subjective: pt alert, pleasant, needs cues, says he has trouble swallowing but has some difficulty clarifying Assessment / Plan / Recommendation CHL IP CLINICAL IMPRESSIONS 03/20/2017 Clinical Impression Pt presents with a mild oropharyngeal dysphagia that is likely multifactorial in nature, given neuro diagnoses (concern for acute MG flare and acute pontomedullary infarct), structural component (suspected osteophyte C4-C5 and cervical hardware C5-C6), and suspected esophageal issues. His oral phase is marked by mildly prolonged mastication and occasional premature spillage, particularly with solids and mixed consistencies, with material filling his vallecular space as he continues to masticate what remains orally. His swallow trigger is relatively timely for his age but with reduced hyolaryngeal elevation and particularly excursion. He has silent penetration during the swallow with thin liquids by straw that almost reaches his vocal folds, but this did clear well with a cued throat clear. Additional penetration occurred with thin liquids as he swallowed them with the barium tablet, but he spontaneously cleared his throat to expectorate it during that instance. No other airway compromise was observed, although he does have moderate residuals in the vallecula and pyriform sinuses that increases with thinner liquids that increases aspiration risk. He needs cues to attempt a second swallow which then reduces the residuals. Suspect that this is more esophageal in nature due to increased sub-UES pressure. Brief scan appeared to reveal barium remaining throughout the esophagus upon completion of testing. Recommend Dys 2 diet and thin liquids by cup with use of second swallows, esophageal precautions, and general aspiraiton precautions.  SLP Visit Diagnosis Dysphagia, oropharyngeal phase (R13.12);Dysphagia, pharyngoesophageal phase (R13.14) Attention and  concentration deficit following -- Frontal lobe and executive function deficit following -- Impact on safety and function Mild aspiration risk;Moderate aspiration risk   CHL IP TREATMENT RECOMMENDATION 03/20/2017 Treatment Recommendations Therapy as outlined in treatment plan below   Prognosis 03/20/2017 Prognosis for Safe Diet Advancement Good Barriers to Reach Goals Cognitive deficits Barriers/Prognosis Comment -- CHL IP DIET RECOMMENDATION 03/20/2017 SLP Diet Recommendations Dysphagia 2 (Fine chop) solids;Thin liquid Liquid Administration via Cup;No straw Medication Administration Whole meds with puree Compensations Minimize environmental distractions;Slow rate;Small sips/bites;Follow solids with liquid;Clear throat intermittently Postural Changes Remain semi-upright after after feeds/meals (Comment);Seated upright at 90 degrees   CHL IP OTHER RECOMMENDATIONS 03/20/2017 Recommended Consults -- Oral Care Recommendations Oral care BID Other Recommendations --   CHL IP FOLLOW UP RECOMMENDATIONS 03/20/2017 Follow up Recommendations Inpatient Rehab   CHL IP FREQUENCY AND DURATION 03/20/2017 Speech Therapy Frequency (ACUTE ONLY) min 2x/week Treatment Duration 2 weeks      CHL IP ORAL PHASE 03/20/2017 Oral Phase Impaired Oral - Pudding Teaspoon -- Oral - Pudding Cup -- Oral - Honey Teaspoon -- Oral - Honey Cup -- Oral - Nectar Teaspoon -- Oral - Nectar Cup -- Oral - Nectar Straw -- Oral - Thin Teaspoon -- Oral - Thin Cup WFL Oral - Thin Straw WFL Oral - Puree WFL Oral - Mech Soft Impaired mastication Oral - Regular -- Oral - Multi-Consistency Premature spillage Oral - Pill WFL Oral Phase - Comment --  CHL IP PHARYNGEAL PHASE 03/20/2017 Pharyngeal Phase Impaired Pharyngeal- Pudding Teaspoon -- Pharyngeal -- Pharyngeal- Pudding Cup --  Pharyngeal -- Pharyngeal- Honey Teaspoon -- Pharyngeal -- Pharyngeal- Honey Cup -- Pharyngeal -- Pharyngeal- Nectar Teaspoon -- Pharyngeal -- Pharyngeal- Nectar Cup -- Pharyngeal -- Pharyngeal- Nectar Straw  -- Pharyngeal -- Pharyngeal- Thin Teaspoon -- Pharyngeal -- Pharyngeal- Thin Cup Reduced anterior laryngeal mobility;Reduced laryngeal elevation;Penetration/Aspiration during swallow;Pharyngeal residue - valleculae;Pharyngeal residue - pyriform Pharyngeal Material enters airway, remains ABOVE vocal cords then ejected out Pharyngeal- Thin Straw Reduced anterior laryngeal mobility;Reduced laryngeal elevation;Penetration/Aspiration during swallow;Pharyngeal residue - valleculae;Pharyngeal residue - pyriform Pharyngeal Material enters airway, remains ABOVE vocal cords and not ejected out Pharyngeal- Puree Reduced anterior laryngeal mobility;Reduced laryngeal elevation;Pharyngeal residue - valleculae Pharyngeal -- Pharyngeal- Mechanical Soft Reduced anterior laryngeal mobility;Reduced laryngeal elevation;Pharyngeal residue - valleculae;Delayed swallow initiation-vallecula Pharyngeal -- Pharyngeal- Regular -- Pharyngeal -- Pharyngeal- Multi-consistency Reduced anterior laryngeal mobility;Reduced laryngeal elevation;Pharyngeal residue - valleculae Pharyngeal -- Pharyngeal- Pill Reduced anterior laryngeal mobility;Reduced laryngeal elevation Pharyngeal -- Pharyngeal Comment --  CHL IP CERVICAL ESOPHAGEAL PHASE 03/20/2017 Cervical Esophageal Phase Impaired Pudding Teaspoon -- Pudding Cup -- Honey Teaspoon -- Honey Cup -- Nectar Teaspoon -- Nectar Cup -- Nectar Straw -- Thin Teaspoon -- Thin Cup Reduced cricopharyngeal relaxation Thin Straw Reduced cricopharyngeal relaxation Puree Reduced cricopharyngeal relaxation Mechanical Soft Reduced cricopharyngeal relaxation Regular -- Multi-consistency -- Pill Reduced cricopharyngeal relaxation Cervical Esophageal Comment -- No flowsheet data found. Germain Osgood 03/20/2017, 3:36 PM  Germain Osgood, M.A. CCC-SLP [phone]             Medications: . sodium chloride    . sodium chloride    . sodium chloride    . sodium chloride    . sodium chloride    . cefTRIAXone  (ROCEPHIN)  IV 2 g (03/21/17 0939)   . aspirin  325 mg Oral Daily  . carvedilol  3.125 mg Oral BID WC  . chlorhexidine gluconate (MEDLINE KIT)  15 mL Mouth Rinse BID  . Chlorhexidine Gluconate Cloth  6 each Topical Daily  . diphenhydrAMINE  50 mg Intravenous Daily  . doxercalciferol  4 mcg Intravenous Q M,W,F-HD  . famotidine  20 mg Oral QHS  . gabapentin  100 mg Oral TID  . Immune Globulin 10%  400 mg/kg Intravenous Q24 Hr x 5  . insulin aspart  0-9 Units Subcutaneous Q4H  . mouth rinse  15 mL Mouth Rinse QID  . methylPREDNISolone (SOLU-MEDROL) injection  40 mg Intravenous Q24H  . sodium chloride flush  10-40 mL Intracatheter Q12H  . sodium chloride flush  3 mL Intravenous Q12H  . warfarin  1 mg Oral ONCE-1800  . Warfarin - Pharmacist Dosing Inpatient   Does not apply q1800   Dialysis Orders:  [facility] MWF  4 hr-  EDW 80 - getting slightly below at times  2 K 2.25 Ca  400/800  right upper AVF  no heparin  hectorol 4  venofer 50  6/25 mircera 75 6/20 - just ^ to 100 for next dose Recent labs: hgb 8.9 INR ok 25% sat ferritin 135 May ipTH 291 Background: 81 y.o. male with ESRD on HD since Jan 18, 2017 PMHx CVA and UTI, HTN, afib, right should melanoma, cervical disc disease, 2nd degree heart block s/p PPM, DM, myasthenia gravis. Admitted 7/2 with fever, hypoxia, MG crisis (related to intolerance to mestinon), acute hypoxemic resp failure (sepsis/MG crisis, +/-pulm edema), + BC for [facility]. Required intubation for resp fx. Self extubated   Assessment/Plan:  1. Acute respiratory failure d/t/ MG crisis/sepsis/volume xs. Self extubated. Stable since. CXR 7/5 clearing of any edema- post  2.  E Coli urosepsis (+ 1/2 Ecoli BC - Ecoli urine >100K colonies)  Vanc stopped and Zosyn continued); WBC 14,500 today Procalcitonin > 150 3. ESRD- MWF via AVF- HD 7/3,7/4 and today on regular MWF schedule. Pre HD wt 80 kg. EDW 80. UF goal 1.5. Lower EDW at discharge.  4. Anemia- Fe def anemia/heme. Dosed  Aranesp 100 on 7/3. 1 unit PRBC's with HD today 7/6 for Hb of 7.2   No Fe for now given bacteremia  5. Metabolic bone disease-  hectorol IV with HD. No binders yet 6. Nutrition -- nepro/prostat  7. Supra therapeutic INR - no heparin HD - vit K given-  INR elevated on admission. Therapeutic today at 2.3. Pharmacy dosing coumadin  8. Myasthenia gravis - IVIG per neuro. NOte says "hesitant to start steroids" but I not that pt is/has been on solumedrol... 9. Possible stroke - neuro not sure if artifact but feel "prudent to assume it is real, small enough that does not preclude anticoagulation 10. DM- per primary  [name], MD [facility] [phone] Pager 03/21/2017, 2:33 PM

## 2017-03-21 NOTE — Progress Notes (Signed)
Pt arrived to 5M17 via bed.  Pt assisted to wheelchair and then to bed.  Pt alert and oriented, complaints of pain in his hips, R>L.  VSS, telemetry applied and CCMD notified.  Will continue to monitor.  Cori Razor, RN

## 2017-03-21 NOTE — Progress Notes (Addendum)
Name: James David MRN: [id number] DOB: Oct 21, 1935    ADMISSION DATE:  03/17/2017 CONSULTATION DATE:  03/18/17  REFERRING MD :  Sherral Hammers  CHIEF COMPLAINT:  SOB   brief:  James David is a 81 y.o. male with a PMH as outlined below including but not limited to recent diagnosis of probable Myasthenia Gravis (was seen as outpatient and had positive acetylcholine receptor antibodies).  He was started on mestinon but did not tolerate it due to severe diarrhea.  He has had progressive weakness for the past month or two to the point where he has had difficulty getting out of bed and has had difficulty swallowing.  He has not had any diplopia or SOB at home.  He was admitted 7/2 with generalized weakness.  He was evaluated by neurology who felt that he likely did have Myasthenia and recommended treatment with IVIG for 5 daily doses (holding steroids for a few days).  On 7/3, he had hypoxia with mild increased WOB; therefore, PCCM was asked to see.  He was placed on NRB which he feels is helping.  He has strong cough and is able to control airway well.  He denies sensation of respiratory fatigue.  He is awaiting his scheduled hemodialysis (he did receive 80mg  lasix prior to [place] seeing him.  Daly urine documented as - 57).  STUDIES:  CXR 7/2 > no acute process.  SIGNIFICANT EVENTS  7/2 > admit. 7/3 > PCCM consult.   Has mild dyspnea but does not feel tired.  Able to follow commands.  Good cough, controlling airway well.  SUBJECTIVE/OVERNIGHT/INTERVAL HX Stable overnight. Was started on PO feeds.   VITAL SIGNS: Temp:  [97.8 F (36.6 C)-98.9 F (37.2 C)] 97.8 F (36.6 C) (07/06 0747) Pulse Rate:  [62-103] 65 (07/06 0800) Resp:  [4-20] 11 (07/06 0800) BP: (141-180)/(49-166) 142/57 (07/06 0800) SpO2:  [96 %-100 %] 98 % (07/06 0800) Weight:  [176 lb 5.9 oz (80 kg)] 176 lb 5.9 oz (80 kg) (07/06 0500)  PHYSICAL EXAMINATION:  EXAM Gen:      No acute distress HEENT:  EOMI, sclera anicteric Neck:      No masses; no thyromegaly Lungs:    Clear to auscultation bilaterally; normal respiratory effort CV:         Regular rate and rhythm; no murmurs Abd:      + bowel sounds; soft, non-tender; no palpable masses, no distension Ext:    No edema; adequate peripheral perfusion Skin:      Warm and dry; no rash Neuro: alert and oriented x 3  LABS  PULMONARY  Recent Labs Lab 03/18/17 1750 03/18/17 2103 03/20/17 0410  PHART 7.512* 7.471* 7.390  PCO2ART 32.1 36.8 43.1  PO2ART 202* 197.0* 95.8  HCO3 25.5 26.7 25.5  TCO2  --  28  --   O2SAT 99.5 100.0 97.1    CBC  Recent Labs Lab 03/19/17 0315 03/20/17 0500 03/21/17 0545  HGB 7.3* 7.3* 7.3*  HCT 23.0* 22.8* 23.4*  WBC 16.2* 15.0* 14.5*  PLT 192 219 224    COAGULATION  Recent Labs Lab 03/18/17 0245 03/18/17 1251 03/19/17 0315 03/20/17 0500 03/21/17 0545  INR 6.78* 2.64 1.79 1.62 2.33    CARDIAC  No results for input(s): TROPONINI in the last 168 hours. No results for input(s): PROBNP in the last 168 hours.   CHEMISTRY  Recent Labs Lab 03/17/17 1257 03/17/17 1817 03/18/17 0245 03/19/17 0315 03/19/17 1658 03/20/17 0500 03/21/17 0545  NA 133*  --  132* 132*  --  131* 130*  K 3.3*  --  2.9* 4.6  --  3.9 4.2  CL 93*  --  95* 96*  --  96* 95*  CO2 28  --  26 24  --  25 24  GLUCOSE 147*  --  109* 211*  --  270* 219*  BUN 54*  --  60* 23*  --  40* 63*  CREATININE 9.23*  --  10.09* 5.45*  --  4.26* 6.28*  CALCIUM 8.9  --  8.5* 7.6*  --  7.9* 8.4*  MG  --  2.2  --  1.8 2.6* 2.7* 2.7*  PHOS  --  3.5  --  4.6 3.9 4.6 4.7*   Estimated Creatinine Clearance: 10 mL/min (A) (by C-G formula based on SCr of 6.28 mg/dL (H)).   LIVER  Recent Labs Lab 03/17/17 1257  03/18/17 0245 03/18/17 1251 03/19/17 0315 03/20/17 0500 03/21/17 0545  AST 17  --  12*  --   --   --   --   ALT 15*  --  14*  --   --   --   --   ALKPHOS 34*  --  33*  --   --   --   --   BILITOT 0.7  --  0.7  --   --   --   --   PROT 5.5*  --   5.9*  --   --   --   --   ALBUMIN 3.0*  --  2.7*  --   --  2.1*  --   INR  --   < > 6.78* 2.64 1.79 1.62 2.33  < > = values in this interval not displayed.   INFECTIOUS  Recent Labs Lab 03/18/17 1314 03/18/17 1618 03/19/17 0315  LATICACIDVEN 1.1 0.6  --   PROCALCITON  --   --  >150.00     ENDOCRINE CBG (last 3)   Recent Labs  03/21/17 0013 03/21/17 0405 03/21/17 0746  GLUCAP 290* 254* 181*    ASSESSMENT / PLAN:  PULMONARY A: Acute resp failure due to MG crisis and sepsis  P:   Stable after he self extubated Wean down O2 as tolerated  CARDIOVASCULAR A:  Afib- rate controlled HTN, hyperlipidemia  P:  Tele monitoring Restart outpatient PO coreg Stop standing lopressor Continue to hold coumadin as INR is therapeutic   RENAL Chronic HD ESRD MWF patient P:   HD today.  Will transfer to floor after HD  GASTROINTESTINAL A:   No issues  P:   PO feeds  HEMATOLOGIC Leukocytosis from sepsis Anemia of chronic illness Supra therapeutic INR P:  Follow CBC  INFECTIOUS A:   Severe sepsis with Ecoli bacteremia, UTI P:   Continue antibiotics Narrow to ceftriaxone today based on sensitivities  ENDOCRINE A:   On AC/HS   P:   SSI coverage  NEUROLOGIC A:   Progressive weakness Myasthenia gravis with ? flare  P:   Continue neuro monitoring Finish IVIG today. Discussed with neuro. Will start tapering steroids.  Reduce solumedrol to 40 mg from 80 mg today with plans to taper to 20 mg/day in a day or 2 Further taper to be decided based on clinical situation.   FAMILY  - Updates: 03/21/2017 --> no family at bedside - Inter-disciplinary family meet or Palliative Care meeting due by:  DAy 7. Current LOS is LOS 4 days  DISPO Transfer to floor after HD.  The patient is  critically ill with multiple organ system failure and requires high complexity decision making for assessment and support, frequent evaluation and titration of therapies, advanced  monitoring, review of radiographic studies and interpretation of complex data.   Critical Care Time devoted to patient care services, exclusive of separately billable procedures, described in this note is 35 minutes.   Marshell Garfinkel MD [facility] Pager [phone] If no answer or after 3pm call: [phone] 03/21/2017, 9:29 AM

## 2017-03-21 NOTE — Progress Notes (Signed)
Patient arrived to unit per bed.  Reviewed treatment plan and this RN agrees.  Report received from bedside RN, Matt.  Consent obtained.  Patient A & O X 4. Lung sounds diminished and clear to ausculation in all fields. Generalized non pitting edema. Cardiac: V paced.  Prepped RUAVF with alcohol and cannulated with two 15 gauge needles.  Pulsation of blood noted.  Flushed access well with saline per protocol.  Connected and secured lines and initiated tx at 1048.  UF goal of 2000 mL and net fluid removal of 1500 mL.  Will continue to monitor.

## 2017-03-21 NOTE — Progress Notes (Signed)
Pt did pulmonary mechanics with good effort no distress or complications noted.   NIF: -38 VC: 1.4L

## 2017-03-22 LAB — GLUCOSE, CAPILLARY
GLUCOSE-CAPILLARY: 131 mg/dL — AB (ref 65–99)
GLUCOSE-CAPILLARY: 242 mg/dL — AB (ref 65–99)
GLUCOSE-CAPILLARY: 334 mg/dL — AB (ref 65–99)
Glucose-Capillary: 109 mg/dL — ABNORMAL HIGH (ref 65–99)
Glucose-Capillary: 233 mg/dL — ABNORMAL HIGH (ref 65–99)
Glucose-Capillary: 83 mg/dL (ref 65–99)

## 2017-03-22 LAB — PHOSPHORUS: PHOSPHORUS: 3.5 mg/dL (ref 2.5–4.6)

## 2017-03-22 LAB — CBC
HEMATOCRIT: 27 % — AB (ref 39.0–52.0)
Hemoglobin: 8.6 g/dL — ABNORMAL LOW (ref 13.0–17.0)
MCH: 30.2 pg (ref 26.0–34.0)
MCHC: 31.9 g/dL (ref 30.0–36.0)
MCV: 94.7 fL (ref 78.0–100.0)
PLATELETS: 232 10*3/uL (ref 150–400)
RBC: 2.85 MIL/uL — ABNORMAL LOW (ref 4.22–5.81)
RDW: 14.2 % (ref 11.5–15.5)
WBC: 13.3 10*3/uL — AB (ref 4.0–10.5)

## 2017-03-22 LAB — BASIC METABOLIC PANEL
ANION GAP: 7 (ref 5–15)
BUN: 38 mg/dL — AB (ref 6–20)
CHLORIDE: 95 mmol/L — AB (ref 101–111)
CO2: 28 mmol/L (ref 22–32)
Calcium: 8.4 mg/dL — ABNORMAL LOW (ref 8.9–10.3)
Creatinine, Ser: 3.99 mg/dL — ABNORMAL HIGH (ref 0.61–1.24)
GFR, EST AFRICAN AMERICAN: 15 mL/min — AB (ref >60)
GFR, EST NON AFRICAN AMERICAN: 13 mL/min — AB (ref >60)
Glucose, Bld: 112 mg/dL — ABNORMAL HIGH (ref 65–99)
POTASSIUM: 3.7 mmol/L (ref 3.5–5.1)
SODIUM: 130 mmol/L — AB (ref 135–145)

## 2017-03-22 LAB — TYPE AND SCREEN
ABO/RH(D): B POS
Antibody Screen: NEGATIVE
Unit division: 0

## 2017-03-22 LAB — MAGNESIUM: MAGNESIUM: 2.2 mg/dL (ref 1.7–2.4)

## 2017-03-22 LAB — PROTIME-INR
INR: 2.01
PROTHROMBIN TIME: 23 s — AB (ref 11.4–15.2)

## 2017-03-22 LAB — BPAM RBC
Blood Product Expiration Date: 201807252359
ISSUE DATE / TIME: 201807061108
UNIT TYPE AND RH: 7300

## 2017-03-22 MED ORDER — LORAZEPAM 1 MG PO TABS
1.0000 mg | ORAL_TABLET | Freq: Four times a day (QID) | ORAL | Status: DC | PRN
  Administered 2017-03-22 – 2017-03-25 (×3): 1 mg via ORAL
  Filled 2017-03-22 (×3): qty 1

## 2017-03-22 MED ORDER — WARFARIN SODIUM 1 MG PO TABS
1.5000 mg | ORAL_TABLET | Freq: Once | ORAL | Status: AC
  Administered 2017-03-22: 1.5 mg via ORAL
  Filled 2017-03-22: qty 1

## 2017-03-22 NOTE — Progress Notes (Signed)
[facility] Progress Note  Subjective:    Moved to floor last night Having some lower abd pain today.  New Foley cath.  HD yesterday. No new issues -doesn't like going   Objective Vitals:   03/22/17 0200 03/22/17 0500 03/22/17 0643 03/22/17 0948  BP: (!) 140/58  (!) 144/70 (!) 151/71  Pulse: 72  73 85  Resp: _0 Temp: 97.7 F (36.5 C)  98.4 F (36.9 C) 97.9 F (36.6 C)  TempSrc: Oral  Oral Oral  SpO2: 95%  98% 98%  Weight:  76.4 kg (168 lb 6.9 oz)    Height:       Physical Exam WNWD male NAD RRR  S1S2 No S3 No audible murmur Lungs clear anteriorly Abd soft mild tenderness to palpation over bladder  No LE edema  SCD's in plance  R upper AVF +bruit  Clear dressing over large blood blister/?skin tear   Recent Labs Lab 03/20/17 0500 03/21/17 0545 03/22/17 0533  NA 131* 130* 130*  K 3.9 4.2 3.7  CL 96* 95* 95*  CO2 _1 GLUCOSE 270* 219* 112*  BUN 40* 63* 38*  CREATININE 4.26* 6.28* 3.99*  CALCIUM 7.9* 8.4* 8.4*  PHOS 4.6 4.7* 3.5     Recent Labs Lab 03/17/17 1257 03/18/17 0245 03/20/17 0500  AST 17 12*  --   ALT 15* 14*  --   ALKPHOS 34* 33*  --   BILITOT 0.7 0.7  --   PROT 5.5* 5.9*  --   ALBUMIN 3.0* 2.7* 2.1*    Recent Labs Lab 03/17/17 1257 03/18/17 0245 03/19/17 0315 03/20/17 0500 03/21/17 0545 03/22/17 0533  WBC 10.2 6.5 16.2* 15.0* 14.5* 13.3*  NEUTROABS 8.8*  --   --   --   --   --   HGB 8.1* 7.8* 7.3* 7.3* 7.3* 8.6*  HCT 25.5* 24.2* 23.0* 22.8* 23.4* 27.0*  MCV 96.2 95.3 97.5 97.0 95.9 94.7  PLT 184 198 192 219 224 232   Blood Culture    Component Value Date/Time   SDES BLOOD LEFT ARM 03/18/2017 1017   SDES BLOOD LEFT HAND 03/18/2017 1017   SPECREQUEST IN PEDIATRIC BOTTLE Blood Culture adequate volume 03/18/2017 1017   SPECREQUEST IN PEDIATRIC BOTTLE Blood Culture adequate volume 03/18/2017 1017   CULT ESCHERICHIA COLI (A) 03/18/2017 1017   CULT NO GROWTH 3 DAYS 03/18/2017 1017   REPTSTATUS  03/21/2017 FINAL 03/18/2017 1017   REPTSTATUS PENDING 03/18/2017 1017     Recent Labs Lab 03/21/17 0746 03/21/17 1628 03/21/17 2048 03/22/17 0523 03/22/17 0736  GLUCAP 181* 98 364* 131* 83   Iron Studies: No results for input(s): IRON, TIBC, TRANSFERRIN, FERRITIN in the last 72 hours. Lab Results  Component Value Date   INR 2.01 03/22/2017   INR 2.33 03/21/2017   INR 1.62 03/20/2017   Studies/Results: Dg Chest Port 1 View  Result Date: 03/21/2017 CLINICAL DATA:  Respiratory failure. EXAM: PORTABLE CHEST 1 VIEW COMPARISON:  03/20/2017. FINDINGS: Right IJ line in stable position. Cardiac pacer with lead tips in right atrium and right ventricle. Cardiomegaly. Normal pulmonary vascularity. Low lung volumes with basilar atelectasis. Mild basilar infiltrates cannot be excluded . Small left pleural effusion cannot be excluded. No pneumothorax. Contrast noted in the stomach. Surgical clips noted over the left chest. Vascular stent noted over the right subclavian region. Prior cervical spine fusion . IMPRESSION: 1. Right IJ line stable position. 2. Stable cardiomegaly.  Cardiac pacer stable position. 3. Low lung  volumes with basilar atelectasis. Mild left base infiltrate cannot be excluded. Small left pleural effusion cannot be excluded . Electronically Signed   By: [name]  Register   On: 03/21/2017 07:25   Dg Swallowing Func-speech Pathology  Result Date: 03/20/2017 Objective Swallowing Evaluation: Type of Study: MBS-Modified Barium Swallow Study Patient Details Name: James David MRN: [id number] Date of Birth: Jul 13, 1936 Today's Date: 03/20/2017 Time: SLP Start Time (ACUTE ONLY): 1409-SLP Stop Time (ACUTE ONLY): 1425 SLP Time Calculation (min) (ACUTE ONLY): 16 min Past Medical History: Past Medical History: Diagnosis Date . Atrial fibrillation ([place])  . Chronic kidney disease, stage IV (severe) ([facility])   baseline creatinine 2-3 . DDD (degenerative disc disease)  . Diabetes mellitus without complication  ([place])  . ESRD (end stage renal disease) on dialysis ([place])  . Hyperlipidemia  . Hypertension  . Melanoma ([place]) 2010  removed at [facility] . Second degree heart block   a. s/p STJ dual chamber PPM - Dr [name] . Stroke [facility]) 01/2017 Past Surgical History: Past Surgical History: Procedure Laterality Date . AV FISTULA PLACEMENT Right 05/31/2014  Procedure: Right Arm Brachiocephalic ARTERIOVENOUS FISTULA CREATION  ;  Surgeon: Conrad Bertie, MD;  Location: [place];  Service: Vascular;  Laterality: Right; . CATARACT EXTRACTION W/ INTRAOCULAR LENS  IMPLANT, BILATERAL   . COLONOSCOPY   . EXCISION MELANOMA WITH SENTINEL LYMPH NODE BIOPSY Right 02/08/2016  Procedure: WIDE EXCISION RIGHT SHOULDER MELANOMA WITH RIGHT SENTINEL LYMPH NODE BIOPSY;  Surgeon: [name], MD;  Location: [facility];  Service: General;  Laterality: Right; . LAMINECTOMY   . PERMANENT PACEMAKER INSERTION N/A 01/20/2014  STJ Assurity dual chamber pacemaker implanted by Dr [name] for 2nd degree AV block HPI: Pt is a 81 y.o.maleadmitted for possible myasthenia gravis crisis with increasing LE weakness and difficulty swallowing, although without coughing per H&P note. He had worsening respiratory status requiring intubation 7/3. New focal deficits inlcuding R sided weakness were noted on 7/4. CT perfusion study is concerning for an acute/subacute infarction at the pontomedullary junction in the midline. Pt self-extubated in the early morning 7/5. PMH includes ACDF (C5-C6), ESRD, DM, HLD, recent CVA, recently dx MG, second degree heart block, melanoma, DDD, a fib Subjective: pt alert, pleasant, needs cues, says he has trouble swallowing but has some difficulty clarifying Assessment / Plan / Recommendation CHL IP CLINICAL IMPRESSIONS 03/20/2017 Clinical Impression Pt presents with a mild oropharyngeal dysphagia that is likely multifactorial in nature, given neuro diagnoses (concern for acute MG flare and acute pontomedullary infarct), structural component (suspected  osteophyte C4-C5 and cervical hardware C5-C6), and suspected esophageal issues. His oral phase is marked by mildly prolonged mastication and occasional premature spillage, particularly with solids and mixed consistencies, with material filling his vallecular space as he continues to masticate what remains orally. His swallow trigger is relatively timely for his age but with reduced hyolaryngeal elevation and particularly excursion. He has silent penetration during the swallow with thin liquids by straw that almost reaches his vocal folds, but this did clear well with a cued throat clear. Additional penetration occurred with thin liquids as he swallowed them with the barium tablet, but he spontaneously cleared his throat to expectorate it during that instance. No other airway compromise was observed, although he does have moderate residuals in the vallecula and pyriform sinuses that increases with thinner liquids that increases aspiration risk. He needs cues to attempt a second swallow which then reduces the residuals. Suspect that this is more esophageal in nature due to increased sub-UES pressure.  Brief scan appeared to reveal barium remaining throughout the esophagus upon completion of testing. Recommend Dys 2 diet and thin liquids by cup with use of second swallows, esophageal precautions, and general aspiraiton precautions.  SLP Visit Diagnosis Dysphagia, oropharyngeal phase (R13.12);Dysphagia, pharyngoesophageal phase (R13.14) Attention and concentration deficit following -- Frontal lobe and executive function deficit following -- Impact on safety and function Mild aspiration risk;Moderate aspiration risk   CHL IP TREATMENT RECOMMENDATION 03/20/2017 Treatment Recommendations Therapy as outlined in treatment plan below   Prognosis 03/20/2017 Prognosis for Safe Diet Advancement Good Barriers to Reach Goals Cognitive deficits Barriers/Prognosis Comment -- CHL IP DIET RECOMMENDATION 03/20/2017 SLP Diet Recommendations  Dysphagia 2 (Fine chop) solids;Thin liquid Liquid Administration via Cup;No straw Medication Administration Whole meds with puree Compensations Minimize environmental distractions;Slow rate;Small sips/bites;Follow solids with liquid;Clear throat intermittently Postural Changes Remain semi-upright after after feeds/meals (Comment);Seated upright at 90 degrees   CHL IP OTHER RECOMMENDATIONS 03/20/2017 Recommended Consults -- Oral Care Recommendations Oral care BID Other Recommendations --   CHL IP FOLLOW UP RECOMMENDATIONS 03/20/2017 Follow up Recommendations Inpatient Rehab   CHL IP FREQUENCY AND DURATION 03/20/2017 Speech Therapy Frequency (ACUTE ONLY) min 2x/week Treatment Duration 2 weeks      CHL IP ORAL PHASE 03/20/2017 Oral Phase Impaired Oral - Pudding Teaspoon -- Oral - Pudding Cup -- Oral - Honey Teaspoon -- Oral - Honey Cup -- Oral - Nectar Teaspoon -- Oral - Nectar Cup -- Oral - Nectar Straw -- Oral - Thin Teaspoon -- Oral - Thin Cup WFL Oral - Thin Straw WFL Oral - Puree WFL Oral - Mech Soft Impaired mastication Oral - Regular -- Oral - Multi-Consistency Premature spillage Oral - Pill WFL Oral Phase - Comment --  CHL IP PHARYNGEAL PHASE 03/20/2017 Pharyngeal Phase Impaired Pharyngeal- Pudding Teaspoon -- Pharyngeal -- Pharyngeal- Pudding Cup -- Pharyngeal -- Pharyngeal- Honey Teaspoon -- Pharyngeal -- Pharyngeal- Honey Cup -- Pharyngeal -- Pharyngeal- Nectar Teaspoon -- Pharyngeal -- Pharyngeal- Nectar Cup -- Pharyngeal -- Pharyngeal- Nectar Straw -- Pharyngeal -- Pharyngeal- Thin Teaspoon -- Pharyngeal -- Pharyngeal- Thin Cup Reduced anterior laryngeal mobility;Reduced laryngeal elevation;Penetration/Aspiration during swallow;Pharyngeal residue - valleculae;Pharyngeal residue - pyriform Pharyngeal Material enters airway, remains ABOVE vocal cords then ejected out Pharyngeal- Thin Straw Reduced anterior laryngeal mobility;Reduced laryngeal elevation;Penetration/Aspiration during swallow;Pharyngeal residue -  valleculae;Pharyngeal residue - pyriform Pharyngeal Material enters airway, remains ABOVE vocal cords and not ejected out Pharyngeal- Puree Reduced anterior laryngeal mobility;Reduced laryngeal elevation;Pharyngeal residue - valleculae Pharyngeal -- Pharyngeal- Mechanical Soft Reduced anterior laryngeal mobility;Reduced laryngeal elevation;Pharyngeal residue - valleculae;Delayed swallow initiation-vallecula Pharyngeal -- Pharyngeal- Regular -- Pharyngeal -- Pharyngeal- Multi-consistency Reduced anterior laryngeal mobility;Reduced laryngeal elevation;Pharyngeal residue - valleculae Pharyngeal -- Pharyngeal- Pill Reduced anterior laryngeal mobility;Reduced laryngeal elevation Pharyngeal -- Pharyngeal Comment --  CHL IP CERVICAL ESOPHAGEAL PHASE 03/20/2017 Cervical Esophageal Phase Impaired Pudding Teaspoon -- Pudding Cup -- Honey Teaspoon -- Honey Cup -- Nectar Teaspoon -- Nectar Cup -- Nectar Straw -- Thin Teaspoon -- Thin Cup Reduced cricopharyngeal relaxation Thin Straw Reduced cricopharyngeal relaxation Puree Reduced cricopharyngeal relaxation Mechanical Soft Reduced cricopharyngeal relaxation Regular -- Multi-consistency -- Pill Reduced cricopharyngeal relaxation Cervical Esophageal Comment -- No flowsheet data found. James David 03/20/2017, 3:36 PM  James David, M.A. CCC-SLP [phone]             Medications: . sodium chloride    . cefTRIAXone (ROCEPHIN)  IV 2 g (03/22/17 0955)   . aspirin  325 mg Oral Daily  . carvedilol  3.125 mg Oral BID WC  . chlorhexidine  gluconate (MEDLINE KIT)  15 mL Mouth Rinse BID  . Chlorhexidine Gluconate Cloth  6 each Topical Daily  . diphenhydrAMINE  50 mg Intravenous Daily  . doxercalciferol  4 mcg Intravenous Q M,W,F-HD  . famotidine  20 mg Oral QHS  . feeding supplement (NEPRO CARB STEADY)  237 mL Oral BID BM  . feeding supplement (PRO-STAT SUGAR FREE 64)  30 mL Oral BID  . gabapentin  100 mg Oral TID  . insulin aspart  0-9 Units Subcutaneous Q4H  .  mouth rinse  15 mL Mouth Rinse QID  . methylPREDNISolone (SOLU-MEDROL) injection  40 mg Intravenous Q24H  . multivitamin  1 tablet Oral QHS  . sodium chloride flush  10-40 mL Intracatheter Q12H  . Warfarin - Pharmacist Dosing Inpatient   Does not apply q1800   Dialysis Orders:  [facility] MWF  4 hr-  EDW 80 - getting slightly below at times  2 K 2.25 Ca  400/800  right upper AVF  no heparin  hectorol 4  venofer 50  6/25 mircera 75 6/20 - just ^ to 100 for next dose Recent labs: hgb 8.9 INR ok 25% sat ferritin 135 May ipTH 291 Background: 81 y.o. male with ESRD on HD since Jan 18, 2017 PMHx CVA and UTI, HTN, afib, right should melanoma, cervical disc disease, 2nd degree heart block s/p PPM, DM, myasthenia gravis. Admitted 7/2 with fever, hypoxia, MG crisis (related to intolerance to mestinon), acute hypoxemic resp failure (sepsis/MG crisis, +/-pulm edema), + BC for [facility]. Required intubation for resp fx. Self extubated   Assessment/Plan:  1. Acute respiratory failure d/t/ MG crisis/sepsis/volume xs. Self extubated. Stable since. CXR 7/5 clearing of any edema- post  2. E Coli urosepsis (+ 1/2 Ecoli BC - Ecoli urine >100K colonies)  On Rocephin ; WBC 13.3 today. Bladder pain this AM with Foley cath  RN to do bladder scan.  3. ESRD- MWF via AVF- HD 7/3,7/4 back on regular schedule 7/6. Net UF  1.5L Post HD wt 78.5kg Lower EDW at discharge.  4. Anemia- Fe def anemia/heme. Dosed Aranesp 100 on 7/3. 1 unit PRBC's with HD 7/6 for Hb of 7.2  Up to 8.6 today.  No Fe for now given bacteremia  5. Metabolic bone disease-  hectorol IV with HD. No binders yet 6. Nutrition -- nepro/prostat  7. Supra therapeutic INR - no heparin HD - vit K given-  INR elevated on admission. Therapeutic today at 2.01. Pharmacy dosing coumadin  8. Myasthenia gravis - IVIG completed per neuro. Note says "hesitant to start steroids" but I note that pt is/has been on solumedrol...still on 40 Q24H 9. Possible stroke - neuro  not sure if artifact but feel "prudent to assume it is real, small enough that does not preclude anticoagulation 10. DM- per primary  Lynnda Child PA-C [place] Pager [phone] 03/22/2017,10:16 AM   I have seen and examined this patient and agree with plan and assessment in the above note with renal recommendations/intervention highlighted. Remains on ATB's for [facility] urosepsis. Finished IVIG. Still on solumedrol Q24H.   Avner Stroder B,MD 03/22/2017 1:44 PM

## 2017-03-22 NOTE — Progress Notes (Signed)
Patient performed a NIF of >-40 and a FVC of 1.5L. Good effort.

## 2017-03-22 NOTE — Progress Notes (Signed)
ANTICOAGULATION CONSULT NOTE - Follow Up Consult  Pharmacy Consult for warfarin Indication: atrial fibrillation  Allergies  Allergen Reactions  . Amlodipine Besylate Swelling    Angioedema   . Lisinopril Other (See Comments)    Renal insufficiency  . Statins Other (See Comments)    Muscle aches  . Ropinirole Other (See Comments)    Causes excessive grogginess the following morning after taking  . Tape Other (See Comments)    SKIN IS THIN AND TAPE WILL ACTUALLY TEAR IT; PLEASE USE COBAN WRAP INSTEAD!!    Patient Measurements: Height: 5\' 10"  (177.8 cm) Weight: 168 lb 6.9 oz (76.4 kg) IBW/kg (Calculated) : 73  Vital Signs: Temp: 98.3 F (36.8 C) (07/07 1341) Temp Source: Oral (07/07 1341) BP: 168/83 (07/07 1341) Pulse Rate: 90 (07/07 1341)  Labs:  Recent Labs  03/20/17 0500 03/21/17 0545 03/22/17 0533  HGB 7.3* 7.3* 8.6*  HCT 22.8* 23.4* 27.0*  PLT 219 224 232  LABPROT 19.4* 26.0* 23.0*  INR 1.62 2.33 2.01  CREATININE 4.26* 6.28* 3.99*    Estimated Creatinine Clearance: 15.8 mL/min (A) (by C-G formula based on SCr of 3.99 mg/dL (H)).   Medications:  Prescriptions Prior to Admission  Medication Sig Dispense Refill Last Dose  . acetaminophen (TYLENOL) 500 MG tablet Take 500 mg by mouth every 8 (eight) hours as needed for mild pain. (pain/headaches)   PRN at PRN  . aspirin EC 81 MG tablet Take 81 mg by mouth at bedtime.   03/16/2017 at 2000  . calcium acetate (PHOSLO) 667 MG capsule Take 2 capsules (1,334 mg total) by mouth 3 (three) times daily. (Patient taking differently: Take 667-1,334 mg by mouth See admin instructions. 1,334 mg three times a day after meals and 667 mg with each snack) 180 capsule 0 03/17/2017 at am  . carvedilol (COREG) 3.125 MG tablet Take 1 tablet (3.125 mg total) by mouth 2 (two) times daily with a meal. 60 tablet 1 03/17/2017 at 1100  . ezetimibe (ZETIA) 10 MG tablet Take 1 tablet (10 mg total) by mouth daily. 30 tablet 1 03/17/2017 at am  .  fenofibrate 160 MG tablet Take 1 tablet (160 mg total) by mouth daily. 30 tablet 0 03/17/2017 at am  . finasteride (PROSCAR) 5 MG tablet Take 1 tablet (5 mg total) by mouth daily. 30 tablet 1 03/17/2017 at am  . gabapentin (NEURONTIN) 100 MG capsule Take 1 capsule (100 mg total) by mouth at bedtime. 30 capsule 1 03/16/2017 at pm  . glimepiride (AMARYL) 1 MG tablet Take 1 tablet (1 mg total) by mouth daily with breakfast. 30 tablet 1 03/17/2017 at am  . Hypromellose (ARTIFICIAL TEARS OP) Place 1 drop into both eyes 3 (three) times daily as needed (for dry eyes).    PRN at PRN  . ipratropium (ATROVENT) 0.03 % nasal spray Place 2 sprays into both nostrils daily as needed for rhinitis.    PRN at PRN  . Multiple Vitamin (DAILY-VITE PO) Take 1 tablet by mouth at bedtime. WITH ZINC   03/16/2017 at pm  . pyridostigmine (MESTINON) 60 MG tablet Take 1/2 to 1 pill po tid (Patient taking differently: Take 30 mg by mouth 2 (two) times daily. BREAKFAST AND SUPPERTIME) 90 tablet 5 03/17/2017 at AM  . warfarin (COUMADIN) 1 MG tablet 1 1/2 tab daily (Patient taking differently: Take 1-1.5 mg by mouth See admin instructions. 1.5 mg with supper on Sun/Tues/Thurs/Sat and 1 mg on Mon/Wed/Fri) 30 tablet 1 Past Week at 1800  .  multivitamin (RENA-VIT) TABS tablet Take 1 tablet by mouth at bedtime. (Patient not taking: Reported on 03/17/2017) 30 tablet 0 Not Taking at Unknown time  . rOPINIRole (REQUIP) 0.5 MG tablet Take 1 tablet (0.5 mg total) by mouth at bedtime. (Patient not taking: Reported on 03/17/2017) 30 tablet 1 Not Taking at Unknown time  . senna-docusate (SENOKOT-S) 8.6-50 MG tablet Take 2 tablets by mouth at bedtime. (Patient not taking: Reported on 03/04/2017)   Not Taking at Unknown time    Assessment: Mr. Deakin is on chronic warfarin PTA for afib. Warfarin was discontinued on admission for an INR of 5.9 (7/2) and was given vitamin K. Warfarin resumed 7/6 and INR 2.01 today. Hgb low but stable, 8.6. Plt WNL.   Goal of  Therapy:  INR 2-3 Monitor platelets by anticoagulation protocol: Yes   Plan:  Warfarin 1.5 mg x1  Daily INR Monitor CBC, s/sx bleeding  Bridgett Larsson, PharmD,  MS 03/22/2017,2:43 PM

## 2017-03-22 NOTE — Progress Notes (Addendum)
Subjective: Much better, extubated  Exam: Vitals:   03/22/17 0643 03/22/17 0948  BP: (!) 144/70 (!) 151/71  Pulse: 73 85  Resp: 16 17  Temp: 98.4 F (36.9 C) 97.9 F (36.6 C)   Gen: In bed, NAD Resp: non-labored breathing, no acute distress Abd: soft, nt  Neuro: MS: awake, alert, follows commands CN: PERRL, does not blink to threat from either side, left gaze preference.  Motor: good 5/5 strength through, possibly mild right arm weakness Sensory:responds to nox stim x 4.   Pertinent Labs: BMP - elevated creatinine.   Impression: 81 year old male with subjective progressive generalized weakness in the setting of recent diagnosis of myasthenia gravis and gram negative sepsis. His hyperreflexia could be a sequela of his previous cervical injury, though current myelopathy would also be a consideration. When he  had a decompensation, this appeared worse, but now I suspect that this was being driven by his sepsis since he markedly improved after treatment. This could have been the driver of all of his acute worsening as infection can certainly cause myasthenia flare.   He was not clearly crossing midline to the right while he was intubated and therefore a CT perfusion/CTA was performed which was negative. There is question of a pontine infarct. His symptoms were completely gone after extubation. EEG was negative. With resolution of the symptoms that prompted his head CT, I wonder if the finding in the brainstem is real or artifactual. I think it would be prudent to assume it is real, but it is small enough that I don't think it precludes anticoagulation.   LDL 37 ok, Echo without embolic source. CTA negative.  He has been started on steriods by CCM. Not certain that his myasthenia was the driver of his current presentation, he has been complaining of generalized weakness for some time, and he could not tolerate Mestinon. Could consider starting prednisone.  Recommendations: 1) I would  favor 20 mg daily of prednisone, with slow taper as outpatient 2) PT, OT 3) treatment of gram negative infection per CCM 4) patient can follow-up with neurology as outpatient, I will send a message to Dr. [name] letting him know 5) Please call with further questions or concerns.   Roland Rack, MD [facility] [phone]  If 7pm- 7am, please page neurology on call as listed in [place].

## 2017-03-22 NOTE — Progress Notes (Signed)
Patient ID: James David, male   DOB: 10/25/1935, 81 y.o.   MRN: [id number]    PROGRESS NOTE  MALAQUIAS LENKER  BPZ:[id number] DOB: 1936-03-01 DOA: 03/17/2017  PCP: [name], MD   Brief Narrative:  81 y.o. male with a PMH as outlined below including but not limited to recent diagnosis of probable Myasthenia Gravis (was seen as outpatient and had positive acetylcholine receptor antibodies).  He was started on mestinon but did not tolerate it due to severe diarrhea.  He has had progressive weakness for the past month or two to the point where he has had difficulty getting out of bed and has had difficulty swallowing.  He has not had any diplopia or SOB at home.  He was admitted 7/2 with generalized weakness.  He was evaluated by neurology who felt that he likely did have Myasthenia and recommended treatment with IVIG for 5 daily doses (holding steroids for a few days).  On 7/3, he had hypoxia with mild increased WOB; therefore, PCCM was asked to see.  He was placed on NRB which he feels is helping.  He has strong cough and is able to control airway well.  He denies sensation of respiratory fatigue.  He is awaiting his scheduled hemodialysis (he did receive 68m lasix prior to [place] him.  Daly urine documented as - 927.  SIGNIFICANT EVENTS  7/2 > admit. 7/3 > PCCM consult.   Has mild dyspnea but does not feel tired.  Able to follow commands.  Good cough, controlling airway well. 7/7 > TRH asked to assume care  Assessment & Plan: Acute respiratory failure d/t/ MG crisis/sepsis/volume  - respiratory status stable, oxygen saturation at target range   E. Coli urosepsis  - on Rocephin, WBC is trending down 15 --> 14 --> 13 K - no fevers - keep on same regimen   ESRD on HD - on schedule MWF - nephrology following  Iron deficiency anemia - Hg up from 7.3 --> 8.6  Myasthenia gravis  - IVIG completed per neuro  - plan to start on low dose Prednisone 20 mg PO QD   DVT  prophylaxis: Coumadin  Code Status: Full  Family Communication: Patient at bedside, wife over the phone  Disposition Plan: to be determined  Consultants:   Neurology  Nephrology   Procedures:     Antimicrobials:   Rocephin   Subjective: No events overnight.   Objective: Vitals:   03/22/17 0500 03/22/17 0643 03/22/17 0948 03/22/17 1341  BP:  (!) 144/70 (!) 151/71 (!) 168/83  Pulse:  73 85 90  Resp:  _0 Temp:  98.4 F (36.9 C) 97.9 F (36.6 C) 98.3 F (36.8 C)  TempSrc:  Oral Oral Oral  SpO2:  98% 98% 95%  Weight: 76.4 kg (168 lb 6.9 oz)     Height:        Intake/Output Summary (Last 24 hours) at 03/22/17 1514 Last data filed at 03/22/17 1344  Gross per 24 hour  Intake               10 ml  Output              395 ml  Net             -385 ml   Filed Weights   03/21/17 1037 03/21/17 1448 03/22/17 0500  Weight: 80 kg (176 lb 5.9 oz) 78.5 kg (173 lb 1 oz) 76.4 kg (168 lb 6.9 oz)  Examination:  General exam: Appears calm and comfortable  Respiratory system: Respiratory effort normal. Cardiovascular system: S1 & S2 heard, RRR. No JVD, murmurs, rubs, gallops or clicks. Gastrointestinal system: Abdomen is nondistended, soft and nontender. No organomegaly or masses felt.  Central nervous system: Alert and oriented. No focal neurological deficits. Extremities: R upper ext AVF + bruit  Data Reviewed: I have personally reviewed following labs and imaging studies  CBC:  Recent Labs Lab 03/17/17 1257 03/18/17 0245 03/19/17 0315 03/20/17 0500 03/21/17 0545 03/22/17 0533  WBC 10.2 6.5 16.2* 15.0* 14.5* 13.3*  NEUTROABS 8.8*  --   --   --   --   --   HGB 8.1* 7.8* 7.3* 7.3* 7.3* 8.6*  HCT 25.5* 24.2* 23.0* 22.8* 23.4* 27.0*  MCV 96.2 95.3 97.5 97.0 95.9 94.7  PLT 184 198 192 219 224 239   Basic Metabolic Panel:  Recent Labs Lab 03/18/17 0245 03/19/17 0315 03/19/17 1658 03/20/17 0500 03/21/17 0545 03/22/17 0533  NA 132* 132*  --  131* 130*  130*  K 2.9* 4.6  --  3.9 4.2 3.7  CL 95* 96*  --  96* 95* 95*  CO2 26 24  --  _0 GLUCOSE 109* 211*  --  270* 219* 112*  BUN 60* 23*  --  40* 63* 38*  CREATININE 10.09* 5.45*  --  4.26* 6.28* 3.99*  CALCIUM 8.5* 7.6*  --  7.9* 8.4* 8.4*  MG  --  1.8 2.6* 2.7* 2.7* 2.2  PHOS  --  4.6 3.9 4.6 4.7* 3.5   Liver Function Tests:  Recent Labs Lab 03/17/17 1257 03/18/17 0245 03/20/17 0500  AST 17 12*  --   ALT 15* 14*  --   ALKPHOS 34* 33*  --   BILITOT 0.7 0.7  --   PROT 5.5* 5.9*  --   ALBUMIN 3.0* 2.7* 2.1*   Coagulation Profile:  Recent Labs Lab 03/18/17 1251 03/19/17 0315 03/20/17 0500 03/21/17 0545 03/22/17 0533  INR 2.64 1.79 1.62 2.33 2.01   HbA1C:  Recent Labs  03/20/17 0500  HGBA1C 5.7*   CBG:  Recent Labs Lab 03/21/17 1628 03/21/17 2048 03/22/17 0523 03/22/17 0736 03/22/17 1243  GLUCAP 98 364* 131* 83 109*   Lipid Profile:  Recent Labs  03/20/17 0500  CHOL 107  HDL 14*  LDLCALC 32  TRIG 306*  CHOLHDL 7.6   Urine analysis:    Component Value Date/Time   COLORURINE YELLOW 03/18/2017 0945   APPEARANCEUR TURBID (A) 03/18/2017 0945   LABSPEC 1.012 03/18/2017 0945   PHURINE 6.0 03/18/2017 0945   GLUCOSEU NEGATIVE 03/18/2017 0945   HGBUR LARGE (A) 03/18/2017 0945   BILIRUBINUR NEGATIVE 03/18/2017 0945   KETONESUR NEGATIVE 03/18/2017 0945   PROTEINUR 100 (A) 03/18/2017 0945   NITRITE NEGATIVE 03/18/2017 0945   LEUKOCYTESUR MODERATE (A) 03/18/2017 0945   Recent Results (from the past 240 hour(s))  Culture, Urine     Status: Abnormal   Collection Time: 03/18/17  9:44 AM  Result Value Ref Range Status   Specimen Description URINE, CATHETERIZED  Final   Special Requests NONE  Final   Culture >=100,000 COLONIES/mL ESCHERICHIA COLI (A)  Final   Report Status 03/20/2017 FINAL  Final   Organism ID, Bacteria ESCHERICHIA COLI (A)  Final      Susceptibility   Escherichia coli - MIC*    AMPICILLIN 8 SENSITIVE Sensitive     CEFAZOLIN  <=4 SENSITIVE Sensitive     CEFTRIAXONE <=1 SENSITIVE  Sensitive     CIPROFLOXACIN <=0.25 SENSITIVE Sensitive     GENTAMICIN <=1 SENSITIVE Sensitive     IMIPENEM <=0.25 SENSITIVE Sensitive     NITROFURANTOIN <=16 SENSITIVE Sensitive     TRIMETH/SULFA <=20 SENSITIVE Sensitive     AMPICILLIN/SULBACTAM 4 SENSITIVE Sensitive     PIP/TAZO <=4 SENSITIVE Sensitive     Extended ESBL NEGATIVE Sensitive     * >=100,000 COLONIES/mL ESCHERICHIA COLI  Culture, blood (routine x 2)     Status: Abnormal   Collection Time: 03/18/17 10:17 AM  Result Value Ref Range Status   Specimen Description BLOOD LEFT ARM  Final   Special Requests IN PEDIATRIC BOTTLE Blood Culture adequate volume  Final   Culture  Setup Time   Final    PED GRAM NEGATIVE RODS CRITICAL RESULT CALLED TO, READ BACK BY AND VERIFIED WITH: TO KCOOK(PHARMd) BY TCLEVELAND 03/19/2017 AT 1:25AM    Culture ESCHERICHIA COLI (A)  Final   Report Status 03/21/2017 FINAL  Final   Organism ID, Bacteria ESCHERICHIA COLI  Final      Susceptibility   Escherichia coli - MIC*    AMPICILLIN 8 SENSITIVE Sensitive     CEFAZOLIN <=4 SENSITIVE Sensitive     CEFEPIME <=1 SENSITIVE Sensitive     CEFTAZIDIME <=1 SENSITIVE Sensitive     CEFTRIAXONE <=1 SENSITIVE Sensitive     CIPROFLOXACIN <=0.25 SENSITIVE Sensitive     GENTAMICIN <=1 SENSITIVE Sensitive     IMIPENEM <=0.25 SENSITIVE Sensitive     TRIMETH/SULFA <=20 SENSITIVE Sensitive     AMPICILLIN/SULBACTAM 4 SENSITIVE Sensitive     PIP/TAZO <=4 SENSITIVE Sensitive     Extended ESBL NEGATIVE Sensitive     * ESCHERICHIA COLI  Culture, blood (routine x 2)     Status: None (Preliminary result)   Collection Time: 03/18/17 10:17 AM  Result Value Ref Range Status   Specimen Description BLOOD LEFT HAND  Final   Special Requests IN PEDIATRIC BOTTLE Blood Culture adequate volume  Final   Culture NO GROWTH 4 DAYS  Final   Report Status PENDING  Incomplete  Blood Culture ID Panel (Reflexed)     Status: Abnormal    Collection Time: 03/18/17 10:17 AM  Result Value Ref Range Status   Enterococcus species NOT DETECTED NOT DETECTED Final   Listeria monocytogenes NOT DETECTED NOT DETECTED Final   Staphylococcus species NOT DETECTED NOT DETECTED Final   Staphylococcus aureus NOT DETECTED NOT DETECTED Final   Streptococcus species NOT DETECTED NOT DETECTED Final   Streptococcus agalactiae NOT DETECTED NOT DETECTED Final   Streptococcus pneumoniae NOT DETECTED NOT DETECTED Final   Streptococcus pyogenes NOT DETECTED NOT DETECTED Final   Acinetobacter baumannii NOT DETECTED NOT DETECTED Final   Enterobacteriaceae species DETECTED (A) NOT DETECTED Final    Comment: Enterobacteriaceae represent a large family of gram-negative bacteria, not a single organism. CRITICAL RESULT CALLED TO, READ BACK BY AND VERIFIED WITH: TO KCOOK(PHARMd) BY TCLEVELAND 03/19/2017 AT 1:25AM    Enterobacter cloacae complex NOT DETECTED NOT DETECTED Final   Escherichia coli DETECTED (A) NOT DETECTED Final    Comment: CRITICAL RESULT CALLED TO, READ BACK BY AND VERIFIED WITH: TO KCOOK(PHARMd) BY TCLEVELAND 03/19/2017 AT 1:25AM    Klebsiella oxytoca NOT DETECTED NOT DETECTED Final   Klebsiella pneumoniae NOT DETECTED NOT DETECTED Final   Proteus species NOT DETECTED NOT DETECTED Final   Serratia marcescens NOT DETECTED NOT DETECTED Final   Carbapenem resistance NOT DETECTED NOT DETECTED Final   Haemophilus influenzae NOT  DETECTED NOT DETECTED Final   Neisseria meningitidis NOT DETECTED NOT DETECTED Final   Pseudomonas aeruginosa NOT DETECTED NOT DETECTED Final   Candida albicans NOT DETECTED NOT DETECTED Final   Candida glabrata NOT DETECTED NOT DETECTED Final   Candida krusei NOT DETECTED NOT DETECTED Final   Candida parapsilosis NOT DETECTED NOT DETECTED Final   Candida tropicalis NOT DETECTED NOT DETECTED Final  MRSA PCR Screening     Status: None   Collection Time: 03/19/17  2:18 AM  Result Value Ref Range Status   MRSA by  PCR NEGATIVE NEGATIVE Final    Comment:        The GeneXpert MRSA Assay (FDA approved for NASAL specimens only), is one component of a comprehensive MRSA colonization surveillance program. It is not intended to diagnose MRSA infection nor to guide or monitor treatment for MRSA infections.       Radiology Studies: Dg Chest Port 1 View  Result Date: 03/21/2017 CLINICAL DATA:  Respiratory failure. EXAM: PORTABLE CHEST 1 VIEW COMPARISON:  03/20/2017. FINDINGS: Right IJ line in stable position. Cardiac pacer with lead tips in right atrium and right ventricle. Cardiomegaly. Normal pulmonary vascularity. Low lung volumes with basilar atelectasis. Mild basilar infiltrates cannot be excluded . Small left pleural effusion cannot be excluded. No pneumothorax. Contrast noted in the stomach. Surgical clips noted over the left chest. Vascular stent noted over the right subclavian region. Prior cervical spine fusion . IMPRESSION: 1. Right IJ line stable position. 2. Stable cardiomegaly.  Cardiac pacer stable position. 3. Low lung volumes with basilar atelectasis. Mild left base infiltrate cannot be excluded. Small left pleural effusion cannot be excluded . Electronically Signed   By: [name]  Register   On: 03/21/2017 07:25      Scheduled Meds: . aspirin  325 mg Oral Daily  . carvedilol  3.125 mg Oral BID WC  . chlorhexidine gluconate (MEDLINE KIT)  15 mL Mouth Rinse BID  . Chlorhexidine Gluconate Cloth  6 each Topical Daily  . diphenhydrAMINE  50 mg Intravenous Daily  . doxercalciferol  4 mcg Intravenous Q M,W,F-HD  . famotidine  20 mg Oral QHS  . feeding supplement (NEPRO CARB STEADY)  237 mL Oral BID BM  . feeding supplement (PRO-STAT SUGAR FREE 64)  30 mL Oral BID  . gabapentin  100 mg Oral TID  . insulin aspart  0-9 Units Subcutaneous Q4H  . mouth rinse  15 mL Mouth Rinse QID  . methylPREDNISolone (SOLU-MEDROL) injection  40 mg Intravenous Q24H  . multivitamin  1 tablet Oral QHS  . sodium  chloride flush  10-40 mL Intracatheter Q12H  . warfarin  1.5 mg Oral ONCE-1800  . Warfarin - Pharmacist Dosing Inpatient   Does not apply q1800   Continuous Infusions: . sodium chloride    . cefTRIAXone (ROCEPHIN)  IV Stopped (03/22/17 1025)     LOS: 5 days   Time spent: 35 minutes   Faye Ramsay, MD [facility] Pager [phone]  If 7PM-7AM, please contact night-coverage [email or web address] Password [email or web address] 03/22/2017, 3:14 PM

## 2017-03-23 LAB — GLUCOSE, CAPILLARY
GLUCOSE-CAPILLARY: 81 mg/dL (ref 65–99)
GLUCOSE-CAPILLARY: 139 mg/dL — AB (ref 65–99)
GLUCOSE-CAPILLARY: 330 mg/dL — AB (ref 65–99)
GLUCOSE-CAPILLARY: 327 mg/dL — AB (ref 65–99)
Glucose-Capillary: 121 mg/dL — ABNORMAL HIGH (ref 65–99)
Glucose-Capillary: 203 mg/dL — ABNORMAL HIGH (ref 65–99)

## 2017-03-23 LAB — RENAL FUNCTION PANEL
Albumin: 2.4 g/dL — ABNORMAL LOW (ref 3.5–5.0)
Anion gap: 9 (ref 5–15)
BUN: 68 mg/dL — AB (ref 6–20)
CALCIUM: 8.4 mg/dL — AB (ref 8.9–10.3)
CHLORIDE: 96 mmol/L — AB (ref 101–111)
CO2: 26 mmol/L (ref 22–32)
CREATININE: 6.29 mg/dL — AB (ref 0.61–1.24)
GFR calc Af Amer: 9 mL/min — ABNORMAL LOW (ref >60)
GFR, EST NON AFRICAN AMERICAN: 8 mL/min — AB (ref >60)
Glucose, Bld: 117 mg/dL — ABNORMAL HIGH (ref 65–99)
Phosphorus: 4.9 mg/dL — ABNORMAL HIGH (ref 2.5–4.6)
Potassium: 3.9 mmol/L (ref 3.5–5.1)
SODIUM: 131 mmol/L — AB (ref 135–145)

## 2017-03-23 LAB — CBC
HCT: 26.6 % — ABNORMAL LOW (ref 39.0–52.0)
Hemoglobin: 8.7 g/dL — ABNORMAL LOW (ref 13.0–17.0)
MCH: 30.7 pg (ref 26.0–34.0)
MCHC: 32.7 g/dL (ref 30.0–36.0)
MCV: 94 fL (ref 78.0–100.0)
Platelets: 238 10*3/uL (ref 150–400)
RBC: 2.83 MIL/uL — AB (ref 4.22–5.81)
RDW: 14.1 % (ref 11.5–15.5)
WBC: 14.8 10*3/uL — AB (ref 4.0–10.5)

## 2017-03-23 LAB — CULTURE, BLOOD (ROUTINE X 2)
Culture: NO GROWTH
Special Requests: ADEQUATE

## 2017-03-23 LAB — PROTIME-INR
INR: 2.06
PROTHROMBIN TIME: 23.6 s — AB (ref 11.4–15.2)

## 2017-03-23 MED ORDER — TRAZODONE HCL 50 MG PO TABS
50.0000 mg | ORAL_TABLET | Freq: Every evening | ORAL | Status: DC | PRN
  Administered 2017-03-24 – 2017-03-25 (×2): 50 mg via ORAL
  Filled 2017-03-23 (×2): qty 1

## 2017-03-23 MED ORDER — PREDNISONE 20 MG PO TABS
20.0000 mg | ORAL_TABLET | Freq: Every day | ORAL | Status: DC
  Administered 2017-03-24 – 2017-03-26 (×3): 20 mg via ORAL
  Filled 2017-03-23 (×4): qty 1

## 2017-03-23 MED ORDER — WARFARIN SODIUM 1 MG PO TABS
1.5000 mg | ORAL_TABLET | Freq: Once | ORAL | Status: AC
  Administered 2017-03-23: 1.5 mg via ORAL
  Filled 2017-03-23: qty 1

## 2017-03-23 NOTE — Progress Notes (Signed)
VC 0.60, NIF unable/unwilling to do at this time. Very poor effort for both as pt appeared very sleepy. RT will attempt again later. RN at bedside and aware of poor effort.

## 2017-03-23 NOTE — Progress Notes (Addendum)
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 "My leg's jumping" Currently PT working with pt (missed last session d.t HD) Up in chair right now but got there only with assist of 2 persons Wife ways was "out of his head" after his last HD (I was not aware) At baseline right now   Objective Vitals:   03/23/17 0232 03/23/17 0441 03/23/17 0500 03/23/17 0925  BP:  (!) 142/55  (!) 147/67  Pulse: (!) 126 80  66  Resp:  20  20  Temp:  98.8 F (37.1 C)  99.1 F (37.3 C)  TempSrc:  Oral  Oral  SpO2: 100% 98%  99%  Weight:   76 kg (167 lb 8.8 oz)   Height:       Physical Exam WNWD male  Not in a very good mood - wishes he was "out of here" TLC in R IJ dry dressing In chair S1S2 No S3 No audible murmur Lungs clear anteriorly Abd soft ND/NT  No LE edema  SCD's in place  R upper AVF +bruit  Clear dressing over large blood blister/?skin tear   Recent Labs Lab 03/20/17 0500 03/21/17 0545 03/22/17 0533  NA 131* 130* 130*  K 3.9 4.2 3.7  CL 96* 95* 95*  CO2 25 24 28   GLUCOSE 270* 219* 112*  BUN 40* 63* 38*  CREATININE 4.26* 6.28* 3.99*  CALCIUM 7.9* 8.4* 8.4*  PHOS 4.6 4.7* 3.5     Recent Labs Lab 03/17/17 1257 03/18/17 0245 03/20/17 0500  AST 17 12*  --   ALT 15* 14*  --   ALKPHOS 34* 33*  --   BILITOT 0.7 0.7  --   PROT 5.5* 5.9*  --   ALBUMIN 3.0* 2.7* 2.1*    Recent Labs Lab 03/17/17 1257  03/19/17 0315 03/20/17 0500 03/21/17 0545 03/22/17 0533 03/23/17 0404  WBC 10.2  < > 16.2* 15.0* 14.5* 13.3* 14.8*  NEUTROABS 8.8*  --   --   --   --   --   --   HGB 8.1*  < > 7.3* 7.3* 7.3* 8.6* 8.7*  HCT 25.5*  < > 23.0* 22.8* 23.4* 27.0* 26.6*  MCV 96.2  < > 97.5 97.0 95.9 94.7 94.0  PLT 184  < > 192 219 224 232 238  < > = values in this interval not displayed. Blood Culture    Component Value Date/Time   SDES BLOOD LEFT ARM 03/18/2017 1017   SDES BLOOD LEFT HAND 03/18/2017 1017   SPECREQUEST IN  PEDIATRIC BOTTLE Blood Culture adequate volume 03/18/2017 1017   SPECREQUEST IN PEDIATRIC BOTTLE Blood Culture adequate volume 03/18/2017 1017   CULT ESCHERICHIA COLI (A) 03/18/2017 1017   CULT NO GROWTH 5 DAYS 03/18/2017 1017   REPTSTATUS 03/21/2017 FINAL 03/18/2017 1017   REPTSTATUS 03/23/2017 FINAL 03/18/2017 1017     Recent Labs Lab 03/22/17 2002 03/22/17 2342 03/23/17 0041 03/23/17 0440 03/23/17 0817  GLUCAP 334* 233* 203* 121* 81   Iron Studies: No results for input(s): IRON, TIBC, TRANSFERRIN, FERRITIN in the last 72 hours. Lab Results  Component Value Date   INR 2.06 03/23/2017   INR 2.01 03/22/2017   INR 2.33 03/21/2017   Medications: . sodium chloride    . cefTRIAXone (ROCEPHIN)  IV Stopped (03/22/17 1025)   . aspirin  325 mg Oral Daily  . carvedilol  3.125 mg Oral BID WC  . chlorhexidine gluconate (MEDLINE KIT)  15 mL Mouth Rinse BID  . Chlorhexidine Gluconate Cloth  6 each Topical Daily  . diphenhydrAMINE  50 mg Intravenous Daily  . doxercalciferol  4 mcg Intravenous Q M,W,F-HD  . famotidine  20 mg Oral QHS  . feeding supplement (NEPRO CARB STEADY)  237 mL Oral BID BM  . feeding supplement (PRO-STAT SUGAR FREE 64)  30 mL Oral BID  . gabapentin  100 mg Oral TID  . insulin aspart  0-9 Units Subcutaneous Q4H  . mouth rinse  15 mL Mouth Rinse QID  . methylPREDNISolone (SOLU-MEDROL) injection  40 mg Intravenous Q24H  . multivitamin  1 tablet Oral QHS  . sodium chloride flush  10-40 mL Intracatheter Q12H  . Warfarin - Pharmacist Dosing Inpatient   Does not apply q1800   Dialysis Orders:  HP MWF  4 hr-  EDW 80 - getting slightly below at times  2 K 2.25 Ca  400/800  right upper AVF  no heparin  hectorol 4  venofer 50  6/25 mircera 75 6/20 - just ^ to 100 for next dose Recent labs: hgb 8.9 INR ok 25% sat ferritin 135 May ipTH 291  Background: 81 y.o. male with ESRD on HD since Jan 18, 2017 PMHx CVA and UTI, HTN, afib, right should melanoma, cervical disc  disease, 2nd degree heart block s/p PPM, DM, myasthenia gravis. Admitted 7/2 with fever, hypoxia, MG crisis (related to intolerance to mestinon), acute hypoxemic resp failure (sepsis/MG crisis, +/-pulm edema), + BC for [facility]. Required intubation for resp fx. Self extubated   Assessment/Plan:  1. Acute respiratory failure d/t/ MG crisis/sepsis/volume xs. Self extubated. Stable since. CXR 7/5 clearing of any edema  2. E Coli urosepsis (+ 1/2 Ecoli BC - Ecoli urine >100K colonies)  On Rocephin ; WBC 13.3 >14.8   3. ESRD- MWF via AVF- HD 7/3,7/4 back on regular schedule 7/6. Last post HD wt 78.5kg.  Lower EDW at discharge.  Next HD Monday 4K bath In the chair 4. Anemia- Fe def anemia/heme. Dosed Aranesp 100 on 7/3. 1 unit PRBC's with HD 7/6 for Hb of 7.2 . Trending up 8.7 No Fe for now given bacteremia  5. Metabolic bone disease-  hectorol IV with HD. No binders yet 6. Nutrition -- nepro/prostat DYS 2 diet  7. Supra therapeutic INR - no heparin HD -  INR elevated on admission required Vit K. Therapeutic today at 2.06. Pharmacy dosing coumadin  8. Myasthenia gravis - remains on solumedrol. Per [facility] plan is to start prednisone 20/day. Intolerant to mestinon.  9. Possible stroke - neuro not sure if artifact but feel "prudent to assume it is real, small enough that does not preclude anticoagulation 10. Frequent falls - started PTA. PT working with him. Unclear right now if will be SNF vs CIR.  11. DM- per primary  Lynnda Child PA-C [place] Pager [phone] 03/23/2017,10:45 AM   I have seen and examined this patient and agree with plan and assessment in the above note with renal recommendations/intervention highlighted. Getting ATB's, PT. HD for tomorrow. PT wants to have him in the chair for HD - OK with me.   Tiant Peixoto B,MD 03/23/2017 12:04 PM

## 2017-03-23 NOTE — Progress Notes (Signed)
Physical Therapy Treatment Patient Details Name: James David MRN: [id number] DOB: 08/27/1936 Today's Date: 03/23/2017    History of Present Illness 82 y.o. male with medical history significant for diabetes mellitus 2 on oral agents, chronic kidney disease on dialysis, atrial fibrillation on chronic warfarin, anemia of chronic kidney disease, dyslipidemia. Recent admission for right subcortical stroke mid May 2018. Patient completed therapy in CIR and was discharged home at that time he was able to mobilize with the assistance of a rolling walker. Diagnosed  mid June with myasthenia gravis. Developed respiratory distress 7/3 and was intubated. Self extubated 7/5. CT - no sign of acute infarct.     PT Comments    Continuing work on functional mobility and activity tolerance;  Bulk of session spent working on sitting balance in unsupported sitting at EOB, continues to need assist for sitting balance;  During transfer work, able to initiate power up, however did not fully extend knees in standing; +2 to transfer to the chair; Fatigued at end of session  Follow Up Recommendations  CIR     Equipment Recommendations  None recommended by PT    Recommendations for Other Services       Precautions / Restrictions Precautions Precautions: Fall    Mobility  Bed Mobility Overal bed mobility: Needs Assistance Bed Mobility: Supine to Sit     Supine to sit: Mod assist     General bed mobility comments: Cues for technqiue; Heavy mod assist to clear feet off EOB and elevat trunk to sit  Transfers Overall transfer level: Needs assistance Equipment used: Rolling walker (2 wheeled) Transfers: Sit to/from [facility] Sit to Stand: +2 physical assistance;Mod assist;From elevated surface Stand pivot transfers: +2 physical assistance;Max assist       General transfer comment: Able to initiate power up to stand, however unable to fully extend knees due to weakness; perfomred  stand pivot transfer to chair with +2 max assist; knees blocked for stability  Ambulation/Gait                 Stairs            Wheelchair Mobility    Modified Rankin (Stroke Patients Only)       Balance     Sitting balance-Leahy Scale: Poor Sitting balance - Comments: sat EOB x 15 minutes; mod support to prevent posterior LOB, frequent cues for balance EOB      Standing balance-Leahy Scale: Poor                              Cognition Arousal/Alertness: Awake/alert Behavior During Therapy: WFL for tasks assessed/performed Overall Cognitive Status: Impaired/Different from baseline                         Following Commands: Follows one step commands consistently Safety/Judgement: Decreased awareness of safety;Decreased awareness of deficits Awareness: Emergent Problem Solving: Slow processing        Exercises      General Comments        Pertinent Vitals/Pain Pain Assessment: No/denies pain (he has hip pain according to wife)    Home Living                      Prior Function            PT Goals (current goals can now be found in the care plan section) Acute Rehab  PT Goals Patient Stated Goal: to get better PT Goal Formulation: With patient/family Time For Goal Achievement: 04/03/17 Potential to Achieve Goals: Good Progress towards PT goals: Progressing toward goals    Frequency    Min 4X/week      PT Plan Current plan remains appropriate    Co-evaluation              AM-PAC PT "6 Clicks" Daily Activity  Outcome Measure  Difficulty turning over in bed (including adjusting bedclothes, sheets and blankets)?: Total Difficulty moving from lying on back to sitting on the side of the bed? : Total Difficulty sitting down on and standing up from a chair with arms (e.g., wheelchair, bedside commode, etc,.)?: Total Help needed moving to and from a bed to chair (including a wheelchair)?: Total Help  needed walking in hospital room?: Total Help needed climbing 3-5 steps with a railing? : Total 6 Click Score: 6    End of Session Equipment Utilized During Treatment: Gait belt Activity Tolerance: Patient limited by fatigue Patient left: in chair;with call bell/phone within reach;with chair alarm set Nurse Communication: Mobility status;Need for lift equipment PT Visit Diagnosis: Other abnormalities of gait and mobility (R26.89);Muscle weakness (generalized) (M62.81);Other symptoms and signs involving the nervous system (R29.898)     Time: 5573-2202 PT Time Calculation (min) (ACUTE ONLY): 28 min  Charges:  $Therapeutic Activity: 23-37 mins                    G Codes:       Roney Marion, PT  Acute Rehabilitation Services Pager [phone] Office [phone]    [place] 03/23/2017, 3:55 PM

## 2017-03-23 NOTE — Progress Notes (Signed)
ANTICOAGULATION CONSULT NOTE - Follow Up Consult  Pharmacy Consult for WARFARIN Indication: atrial fibrillation  Allergies  Allergen Reactions  . Amlodipine Besylate Swelling    Angioedema   . Lisinopril Other (See Comments)    Renal insufficiency  . Statins Other (See Comments)    Muscle aches  . Ropinirole Other (See Comments)    Causes excessive grogginess the following morning after taking  . Tape Other (See Comments)    SKIN IS THIN AND TAPE WILL ACTUALLY TEAR IT; PLEASE USE COBAN WRAP INSTEAD!!    Patient Measurements: Height: 5\' 10"  (177.8 cm) Weight: 167 lb 8.8 oz (76 kg) IBW/kg (Calculated) : 73  Vital Signs: Temp: 99.1 F (37.3 C) (07/08 0925) Temp Source: Oral (07/08 0925) BP: 147/67 (07/08 0925) Pulse Rate: 66 (07/08 0925)  Labs:  Recent Labs  03/21/17 0545 03/22/17 0533 03/23/17 0404 03/23/17 1025  HGB 7.3* 8.6* 8.7*  --   HCT 23.4* 27.0* 26.6*  --   PLT 224 232 238  --   LABPROT 26.0* 23.0* 23.6*  --   INR 2.33 2.01 2.06  --   CREATININE 6.28* 3.99*  --  6.29*    Estimated Creatinine Clearance: 10 mL/min (A) (by C-G formula based on SCr of 6.29 mg/dL (H)).   Medications:  Prescriptions Prior to Admission  Medication Sig Dispense Refill Last Dose  . acetaminophen (TYLENOL) 500 MG tablet Take 500 mg by mouth every 8 (eight) hours as needed for mild pain. (pain/headaches)   PRN at PRN  . aspirin EC 81 MG tablet Take 81 mg by mouth at bedtime.   03/16/2017 at 2000  . calcium acetate (PHOSLO) 667 MG capsule Take 2 capsules (1,334 mg total) by mouth 3 (three) times daily. (Patient taking differently: Take 667-1,334 mg by mouth See admin instructions. 1,334 mg three times a day after meals and 667 mg with each snack) 180 capsule 0 03/17/2017 at am  . carvedilol (COREG) 3.125 MG tablet Take 1 tablet (3.125 mg total) by mouth 2 (two) times daily with a meal. 60 tablet 1 03/17/2017 at 1100  . ezetimibe (ZETIA) 10 MG tablet Take 1 tablet (10 mg total) by mouth  daily. 30 tablet 1 03/17/2017 at am  . fenofibrate 160 MG tablet Take 1 tablet (160 mg total) by mouth daily. 30 tablet 0 03/17/2017 at am  . finasteride (PROSCAR) 5 MG tablet Take 1 tablet (5 mg total) by mouth daily. 30 tablet 1 03/17/2017 at am  . gabapentin (NEURONTIN) 100 MG capsule Take 1 capsule (100 mg total) by mouth at bedtime. 30 capsule 1 03/16/2017 at pm  . glimepiride (AMARYL) 1 MG tablet Take 1 tablet (1 mg total) by mouth daily with breakfast. 30 tablet 1 03/17/2017 at am  . Hypromellose (ARTIFICIAL TEARS OP) Place 1 drop into both eyes 3 (three) times daily as needed (for dry eyes).    PRN at PRN  . ipratropium (ATROVENT) 0.03 % nasal spray Place 2 sprays into both nostrils daily as needed for rhinitis.    PRN at PRN  . Multiple Vitamin (DAILY-VITE PO) Take 1 tablet by mouth at bedtime. WITH ZINC   03/16/2017 at pm  . pyridostigmine (MESTINON) 60 MG tablet Take 1/2 to 1 pill po tid (Patient taking differently: Take 30 mg by mouth 2 (two) times daily. BREAKFAST AND SUPPERTIME) 90 tablet 5 03/17/2017 at AM  . warfarin (COUMADIN) 1 MG tablet 1 1/2 tab daily (Patient taking differently: Take 1-1.5 mg by mouth See admin instructions.  1.5 mg with supper on Sun/Tues/Thurs/Sat and 1 mg on Mon/Wed/Fri) 30 tablet 1 Past Week at 1800  . multivitamin (RENA-VIT) TABS tablet Take 1 tablet by mouth at bedtime. (Patient not taking: Reported on 03/17/2017) 30 tablet 0 Not Taking at Unknown time  . rOPINIRole (REQUIP) 0.5 MG tablet Take 1 tablet (0.5 mg total) by mouth at bedtime. (Patient not taking: Reported on 03/17/2017) 30 tablet 1 Not Taking at Unknown time  . senna-docusate (SENOKOT-S) 8.6-50 MG tablet Take 2 tablets by mouth at bedtime. (Patient not taking: Reported on 03/04/2017)   Not Taking at Unknown time     Assessment: Patient is on chronic warfarin therapy PTA for atrial fibrillation. Supratherapeutic INR on admission (7/2) (INR 5.9) and received Vitamin K. Warfarin resumed on 7/6 and INR therapeutic at  2.06 today. No signs of bleeding. Hgb low and stable, 8.7. PLT WNL.   PTA Warfarin Regimen 1.5mg  daily except for 1mg  on MWF   Goal of Therapy:  INR 2-3 Monitor platelets by anticoagulation protocol: Yes   Plan:  7/8: Warfarin PO 1.5 mg x1 tonight Daily INR and CBC Monitor for s/sx of bleeding  Bridgett Larsson, Pharm D, MS 03/23/2017,1:06 PM

## 2017-03-23 NOTE — Progress Notes (Signed)
Patient ID: James David, male   DOB: 05-29-1936, 81 y.o.   MRN: [id number]    PROGRESS NOTE  James David  BUL:[id number] DOB: 1936/01/11 DOA: 03/17/2017  PCP: [name], MD   Brief Narrative:  81 y.o. male with a PMH as outlined below including but not limited to recent diagnosis of probable Myasthenia Gravis (was seen as outpatient and had positive acetylcholine receptor antibodies).  He was started on mestinon but did not tolerate it due to severe diarrhea.  He has had progressive weakness for the past month or two to the point where he has had difficulty getting out of bed and has had difficulty swallowing.  He has not had any diplopia or SOB at home.  He was admitted 7/2 with generalized weakness.  He was evaluated by neurology who felt that he likely did have Myasthenia and recommended treatment with IVIG for 5 daily doses (holding steroids for a few days).  On 7/3, he had hypoxia with mild increased WOB; therefore, PCCM was asked to see.  He was placed on NRB which he feels is helping.  He has strong cough and is able to control airway well.  He denies sensation of respiratory fatigue.  He is awaiting his scheduled hemodialysis (he did receive 36m lasix prior to u[place]eing him.  Daly urine documented as - 989.  SIGNIFICANT EVENTS  7/2 > admit. 7/3 > PCCM consult.   Has mild dyspnea but does not feel tired.  Able to follow commands.  Good cough, controlling airway well. 7/7 > TRH asked to assume care  Assessment & Plan: Acute respiratory failure d/t/ MG crisis/sepsis/volume  - respiratory status stable, oxygen saturation at target range  - no dyspnea or chest pain reported this AM   E. Coli urosepsis  - on Rocephin, WBC is trending down 15 --> 14 --> 13 --> 14 K - no fevers, not clear why WBC up, > steroids  - repeat CBC in AM - repeat blood cultures In am to ensure clearing   ESRD on HD - on schedule MWF - next session on Monday   Iron deficiency anemia - Hg up  from 7.3 --> 8.6 --> 8.7 - no signs of active bleeding  - CBC in AM  Myasthenia gravis  - IVIG completed per neuro  - plan to start on low dose Prednisone 20 mg PO QD today  - PT eval done, plan for Inpt Rehab when bed available   DVT prophylaxis: Coumadin  Code Status: Full  Family Communication: Patient and wife at bedside  Disposition Plan: inpt rehab when bed available   Consultants:   Neurology  Nephrology   Procedures:     Antimicrobials:   Rocephin   Subjective: Pt denies concerns this AM.   Objective: Vitals:   03/23/17 0441 03/23/17 0500 03/23/17 0925 03/23/17 1450  BP: (!) 142/55  (!) 147/67 (!) 148/64  Pulse: 80  66 88  Resp: 20  20 20   Temp: 98.8 F (37.1 C)  99.1 F (37.3 C) 97.7 F (36.5 C)  TempSrc: Oral  Oral Oral  SpO2: 98%  99% 99%  Weight:  76 kg (167 lb 8.8 oz)    Height:        Intake/Output Summary (Last 24 hours) at 03/23/17 1645 Last data filed at 03/23/17 1042  Gross per 24 hour  Intake               30 ml  Output  475 ml  Net             -445 ml   Filed Weights   03/21/17 1448 03/22/17 0500 03/23/17 0500  Weight: 78.5 kg (173 lb 1 oz) 76.4 kg (168 lb 6.9 oz) 76 kg (167 lb 8.8 oz)    Physical Exam  Constitutional: Appears well-developed and well-nourished. No distress.  CVS: RRR, S1/S2 +, no gallops, no carotid bruit.  Pulmonary: Effort and breath sounds normal, no stridor Abdominal: Soft. BS +,  no distension, tenderness, rebound or guarding.  Musculoskeletal: Normal range of motion. No edema and no tenderness. R upper ext AVF + bruit  Data Reviewed: I have personally reviewed following labs and imaging studies  CBC:  Recent Labs Lab 03/17/17 1257  03/19/17 0315 03/20/17 0500 03/21/17 0545 03/22/17 0533 03/23/17 0404  WBC 10.2  < > 16.2* 15.0* 14.5* 13.3* 14.8*  NEUTROABS 8.8*  --   --   --   --   --   --   HGB 8.1*  < > 7.3* 7.3* 7.3* 8.6* 8.7*  HCT 25.5*  < > 23.0* 22.8* 23.4* 27.0* 26.6*  MCV  96.2  < > 97.5 97.0 95.9 94.7 94.0  PLT 184  < > 192 219 224 232 238  < > = values in this interval not displayed. Basic Metabolic Panel:  Recent Labs Lab 03/19/17 0315 03/19/17 1658 03/20/17 0500 03/21/17 0545 03/22/17 0533 03/23/17 1025  NA 132*  --  131* 130* 130* 131*  K 4.6  --  3.9 4.2 3.7 3.9  CL 96*  --  96* 95* 95* 96*  CO2 24  --  25 24 28 26   GLUCOSE 211*  --  270* 219* 112* 117*  BUN 23*  --  40* 63* 38* 68*  CREATININE 5.45*  --  4.26* 6.28* 3.99* 6.29*  CALCIUM 7.6*  --  7.9* 8.4* 8.4* 8.4*  MG 1.8 2.6* 2.7* 2.7* 2.2  --   PHOS 4.6 3.9 4.6 4.7* 3.5 4.9*   Liver Function Tests:  Recent Labs Lab 03/17/17 1257 03/18/17 0245 03/20/17 0500 03/23/17 1025  AST 17 12*  --   --   ALT 15* 14*  --   --   ALKPHOS 34* 33*  --   --   BILITOT 0.7 0.7  --   --   PROT 5.5* 5.9*  --   --   ALBUMIN 3.0* 2.7* 2.1* 2.4*   Coagulation Profile:  Recent Labs Lab 03/19/17 0315 03/20/17 0500 03/21/17 0545 03/22/17 0533 03/23/17 0404  INR 1.79 1.62 2.33 2.01 2.06   HbA1C: No results for input(s): HGBA1C in the last 72 hours. CBG:  Recent Labs Lab 03/23/17 0041 03/23/17 0440 03/23/17 0817 03/23/17 1111 03/23/17 1627  GLUCAP 203* 121* 81 139* 330*   Urine analysis:    Component Value Date/Time   COLORURINE YELLOW 03/18/2017 0945   APPEARANCEUR TURBID (A) 03/18/2017 0945   LABSPEC 1.012 03/18/2017 0945   PHURINE 6.0 03/18/2017 0945   GLUCOSEU NEGATIVE 03/18/2017 0945   HGBUR LARGE (A) 03/18/2017 0945   BILIRUBINUR NEGATIVE 03/18/2017 0945   KETONESUR NEGATIVE 03/18/2017 0945   PROTEINUR 100 (A) 03/18/2017 0945   NITRITE NEGATIVE 03/18/2017 0945   LEUKOCYTESUR MODERATE (A) 03/18/2017 0945   Recent Results (from the past 240 hour(s))  Culture, Urine     Status: Abnormal   Collection Time: 03/18/17  9:44 AM  Result Value Ref Range Status   Specimen Description URINE, CATHETERIZED  Final  Special Requests NONE  Final   Culture >=100,000 COLONIES/mL  ESCHERICHIA COLI (A)  Final   Report Status 03/20/2017 FINAL  Final   Organism ID, Bacteria ESCHERICHIA COLI (A)  Final      Susceptibility   Escherichia coli - MIC*    AMPICILLIN 8 SENSITIVE Sensitive     CEFAZOLIN <=4 SENSITIVE Sensitive     CEFTRIAXONE <=1 SENSITIVE Sensitive     CIPROFLOXACIN <=0.25 SENSITIVE Sensitive     GENTAMICIN <=1 SENSITIVE Sensitive     IMIPENEM <=0.25 SENSITIVE Sensitive     NITROFURANTOIN <=16 SENSITIVE Sensitive     TRIMETH/SULFA <=20 SENSITIVE Sensitive     AMPICILLIN/SULBACTAM 4 SENSITIVE Sensitive     PIP/TAZO <=4 SENSITIVE Sensitive     Extended ESBL NEGATIVE Sensitive     * >=100,000 COLONIES/mL ESCHERICHIA COLI  Culture, blood (routine x 2)     Status: Abnormal   Collection Time: 03/18/17 10:17 AM  Result Value Ref Range Status   Specimen Description BLOOD LEFT ARM  Final   Special Requests IN PEDIATRIC BOTTLE Blood Culture adequate volume  Final   Culture  Setup Time   Final    PED GRAM NEGATIVE RODS CRITICAL RESULT CALLED TO, READ BACK BY AND VERIFIED WITH: TO KCOOK(PHARMd) BY TCLEVELAND 03/19/2017 AT 1:25AM    Culture ESCHERICHIA COLI (A)  Final   Report Status 03/21/2017 FINAL  Final   Organism ID, Bacteria ESCHERICHIA COLI  Final      Susceptibility   Escherichia coli - MIC*    AMPICILLIN 8 SENSITIVE Sensitive     CEFAZOLIN <=4 SENSITIVE Sensitive     CEFEPIME <=1 SENSITIVE Sensitive     CEFTAZIDIME <=1 SENSITIVE Sensitive     CEFTRIAXONE <=1 SENSITIVE Sensitive     CIPROFLOXACIN <=0.25 SENSITIVE Sensitive     GENTAMICIN <=1 SENSITIVE Sensitive     IMIPENEM <=0.25 SENSITIVE Sensitive     TRIMETH/SULFA <=20 SENSITIVE Sensitive     AMPICILLIN/SULBACTAM 4 SENSITIVE Sensitive     PIP/TAZO <=4 SENSITIVE Sensitive     Extended ESBL NEGATIVE Sensitive     * ESCHERICHIA COLI  Culture, blood (routine x 2)     Status: None   Collection Time: 03/18/17 10:17 AM  Result Value Ref Range Status   Specimen Description BLOOD LEFT HAND  Final    Special Requests IN PEDIATRIC BOTTLE Blood Culture adequate volume  Final   Culture NO GROWTH 5 DAYS  Final   Report Status 03/23/2017 FINAL  Final  Blood Culture ID Panel (Reflexed)     Status: Abnormal   Collection Time: 03/18/17 10:17 AM  Result Value Ref Range Status   Enterococcus species NOT DETECTED NOT DETECTED Final   Listeria monocytogenes NOT DETECTED NOT DETECTED Final   Staphylococcus species NOT DETECTED NOT DETECTED Final   Staphylococcus aureus NOT DETECTED NOT DETECTED Final   Streptococcus species NOT DETECTED NOT DETECTED Final   Streptococcus agalactiae NOT DETECTED NOT DETECTED Final   Streptococcus pneumoniae NOT DETECTED NOT DETECTED Final   Streptococcus pyogenes NOT DETECTED NOT DETECTED Final   Acinetobacter baumannii NOT DETECTED NOT DETECTED Final   Enterobacteriaceae species DETECTED (A) NOT DETECTED Final    Comment: Enterobacteriaceae represent a large family of gram-negative bacteria, not a single organism. CRITICAL RESULT CALLED TO, READ BACK BY AND VERIFIED WITH: TO KCOOK(PHARMd) BY TCLEVELAND 03/19/2017 AT 1:25AM    Enterobacter cloacae complex NOT DETECTED NOT DETECTED Final   Escherichia coli DETECTED (A) NOT DETECTED Final    Comment: CRITICAL  RESULT CALLED TO, READ BACK BY AND VERIFIED WITH: TO KCOOK(PHARMd) BY TCLEVELAND 03/19/2017 AT 1:25AM    Klebsiella oxytoca NOT DETECTED NOT DETECTED Final   Klebsiella pneumoniae NOT DETECTED NOT DETECTED Final   Proteus species NOT DETECTED NOT DETECTED Final   Serratia marcescens NOT DETECTED NOT DETECTED Final   Carbapenem resistance NOT DETECTED NOT DETECTED Final   Haemophilus influenzae NOT DETECTED NOT DETECTED Final   Neisseria meningitidis NOT DETECTED NOT DETECTED Final   Pseudomonas aeruginosa NOT DETECTED NOT DETECTED Final   Candida albicans NOT DETECTED NOT DETECTED Final   Candida glabrata NOT DETECTED NOT DETECTED Final   Candida krusei NOT DETECTED NOT DETECTED Final   Candida parapsilosis  NOT DETECTED NOT DETECTED Final   Candida tropicalis NOT DETECTED NOT DETECTED Final  MRSA PCR Screening     Status: None   Collection Time: 03/19/17  2:18 AM  Result Value Ref Range Status   MRSA by PCR NEGATIVE NEGATIVE Final    Comment:        The GeneXpert MRSA Assay (FDA approved for NASAL specimens only), is one component of a comprehensive MRSA colonization surveillance program. It is not intended to diagnose MRSA infection nor to guide or monitor treatment for MRSA infections.       Radiology Studies: No results found.    Scheduled Meds: . aspirin  325 mg Oral Daily  . carvedilol  3.125 mg Oral BID WC  . chlorhexidine gluconate (MEDLINE KIT)  15 mL Mouth Rinse BID  . Chlorhexidine Gluconate Cloth  6 each Topical Daily  . diphenhydrAMINE  50 mg Intravenous Daily  . doxercalciferol  4 mcg Intravenous Q M,W,F-HD  . famotidine  20 mg Oral QHS  . feeding supplement (NEPRO CARB STEADY)  237 mL Oral BID BM  . feeding supplement (PRO-STAT SUGAR FREE 64)  30 mL Oral BID  . gabapentin  100 mg Oral TID  . insulin aspart  0-9 Units Subcutaneous Q4H  . mouth rinse  15 mL Mouth Rinse QID  . methylPREDNISolone (SOLU-MEDROL) injection  40 mg Intravenous Q24H  . multivitamin  1 tablet Oral QHS  . sodium chloride flush  10-40 mL Intracatheter Q12H  . warfarin  1.5 mg Oral ONCE-1800  . Warfarin - Pharmacist Dosing Inpatient   Does not apply q1800   Continuous Infusions: . sodium chloride    . cefTRIAXone (ROCEPHIN)  IV Stopped (03/23/17 1127)     LOS: 6 days   Time spent: 25 minutes  Faye Ramsay, MD [facility] Pager [phone]  If 7PM-7AM, please contact night-coverage [email or web address] Password [email or web address] 03/23/2017, 4:45 PM

## 2017-03-23 NOTE — Progress Notes (Signed)
Patient performed NIF of -30 with good effort and FVC of 1.1L with good effort.

## 2017-03-24 DIAGNOSIS — N186 End stage renal disease: Secondary | ICD-10-CM

## 2017-03-24 DIAGNOSIS — I10 Essential (primary) hypertension: Secondary | ICD-10-CM

## 2017-03-24 DIAGNOSIS — Z8673 Personal history of transient ischemic attack (TIA), and cerebral infarction without residual deficits: Secondary | ICD-10-CM

## 2017-03-24 DIAGNOSIS — G7000 Myasthenia gravis without (acute) exacerbation: Secondary | ICD-10-CM

## 2017-03-24 DIAGNOSIS — R1310 Dysphagia, unspecified: Secondary | ICD-10-CM

## 2017-03-24 DIAGNOSIS — I482 Chronic atrial fibrillation: Secondary | ICD-10-CM

## 2017-03-24 DIAGNOSIS — R7309 Other abnormal glucose: Secondary | ICD-10-CM

## 2017-03-24 DIAGNOSIS — Z992 Dependence on renal dialysis: Secondary | ICD-10-CM

## 2017-03-24 DIAGNOSIS — E119 Type 2 diabetes mellitus without complications: Secondary | ICD-10-CM

## 2017-03-24 DIAGNOSIS — D72829 Elevated white blood cell count, unspecified: Secondary | ICD-10-CM

## 2017-03-24 LAB — GLUCOSE, CAPILLARY
GLUCOSE-CAPILLARY: 196 mg/dL — AB (ref 65–99)
GLUCOSE-CAPILLARY: 94 mg/dL (ref 65–99)
Glucose-Capillary: 112 mg/dL — ABNORMAL HIGH (ref 65–99)
Glucose-Capillary: 185 mg/dL — ABNORMAL HIGH (ref 65–99)
Glucose-Capillary: 274 mg/dL — ABNORMAL HIGH (ref 65–99)

## 2017-03-24 LAB — RENAL FUNCTION PANEL
ALBUMIN: 2.3 g/dL — AB (ref 3.5–5.0)
ANION GAP: 10 (ref 5–15)
BUN: 95 mg/dL — ABNORMAL HIGH (ref 6–20)
CALCIUM: 8.4 mg/dL — AB (ref 8.9–10.3)
CO2: 25 mmol/L (ref 22–32)
Chloride: 99 mmol/L — ABNORMAL LOW (ref 101–111)
Creatinine, Ser: 7.34 mg/dL — ABNORMAL HIGH (ref 0.61–1.24)
GFR, EST AFRICAN AMERICAN: 7 mL/min — AB (ref >60)
GFR, EST NON AFRICAN AMERICAN: 6 mL/min — AB (ref >60)
Glucose, Bld: 110 mg/dL — ABNORMAL HIGH (ref 65–99)
PHOSPHORUS: 5 mg/dL — AB (ref 2.5–4.6)
POTASSIUM: 4 mmol/L (ref 3.5–5.1)
SODIUM: 134 mmol/L — AB (ref 135–145)

## 2017-03-24 LAB — CBC
HEMATOCRIT: 26.4 % — AB (ref 39.0–52.0)
HEMOGLOBIN: 8.5 g/dL — AB (ref 13.0–17.0)
MCH: 30.4 pg (ref 26.0–34.0)
MCHC: 32.2 g/dL (ref 30.0–36.0)
MCV: 94.3 fL (ref 78.0–100.0)
Platelets: 258 10*3/uL (ref 150–400)
RBC: 2.8 MIL/uL — AB (ref 4.22–5.81)
RDW: 14.3 % (ref 11.5–15.5)
WBC: 15.9 10*3/uL — AB (ref 4.0–10.5)

## 2017-03-24 LAB — PROTIME-INR
INR: 2.2
Prothrombin Time: 24.8 seconds — ABNORMAL HIGH (ref 11.4–15.2)

## 2017-03-24 MED ORDER — ACETAMINOPHEN 325 MG PO TABS
ORAL_TABLET | ORAL | Status: AC
  Filled 2017-03-24: qty 2

## 2017-03-24 MED ORDER — DARBEPOETIN ALFA 100 MCG/0.5ML IJ SOSY
100.0000 ug | PREFILLED_SYRINGE | INTRAMUSCULAR | Status: DC
  Administered 2017-03-26: 100 ug via INTRAVENOUS
  Filled 2017-03-24: qty 0.5

## 2017-03-24 MED ORDER — WARFARIN SODIUM 1 MG PO TABS
1.0000 mg | ORAL_TABLET | Freq: Once | ORAL | Status: AC
  Filled 2017-03-24: qty 1

## 2017-03-24 MED ORDER — DOXERCALCIFEROL 4 MCG/2ML IV SOLN
INTRAVENOUS | Status: AC
  Filled 2017-03-24: qty 2

## 2017-03-24 NOTE — Progress Notes (Signed)
NIF- -60 x 3 VC = 1.7L x 2

## 2017-03-24 NOTE — Progress Notes (Addendum)
[facility] Progress Note   Dialysis Orders: HP MWF 4 hr- EDW 80 - getting slightly below at times 2 K 2.25 Ca 400/800 right upper AVF no heparin hectorol 4 venofer 50 6/25 mircera 75 6/20 - just ^ to 100 for next dose Recent labs: hgb 8.9 INR ok 25% sat ferritin 135 May ipTH 291   Assessment/Plan: 1. Acute respiratory failure d/t/ MG crisis/sepsis/volume xs. Self extubated. Stable since. CXR questionable vasc congestion resolved as of 7/5.  2. E Coli urosepsis (+ 1/2 Ecoli BC - Ecoli urine >100K colonies) On Rocephin ; WBC 13.3 >14.8   3. ESRD- MWF via AVF- Lower EDW at discharge.  Next HD today 4K bath In the chair 4. Anemia- Fe def anemia- Dosed Aranesp 100 on 7/3. 1 unit PRBC's with HD 7/6 for Hb of 7.2 . Trending up 8.5 No Fe for now given bacteremia; redose ESA 7/11 5. Metabolic bone disease- hectorol IV with HD. No binders yet 6. Nutrition -- nepro/prostat DYS 2 diet intake variable alb 2.3 7. Supra therapeutic INR resolved- no heparin HD - INR elevated on admission required Vit K. 2.2 7/9 . Pharmacy dosing coumadin  8. Myasthenia gravis - converted to prednisone 20/day 7/9  Intolerant to mestinon.  9. Possible stroke - neuro not sure if artifact but feel "prudent to assume it is real, small enough that does not preclude anticoagulation 10. Frequent falls - started PTA. PT working with him. Unclear right now if will be SNF vs CIR.  11. DM- per primary  12. BP/volulme - net UF 1.5 7/6 - with post wt 78.5- losing weight - titrate volume for better BP control 13. Disp - being evaluated for rehab- too unstable physically to go home.  Myriam Jacobson, PA-C [place] 03/24/2017,10:24 AM  LOS: 7 days   Pt seen, examined and agree w A/P as above.  Kelly Splinter MD [facility] pager [phone]   03/24/2017, 11:10 AM    Subjective:   Irritated that he hasn't had PT yet today and that he doesn't know when his  HD will be done.  Objective Vitals:   03/24/17 0144 03/24/17 0500 03/24/17 0540 03/24/17 0917  BP:   (!) 146/60 (!) 162/100  Pulse:   74 86  Resp: 16  20 18   Temp: 98.4 F (36.9 C)  98.6 F (37 C) 97.6 F (36.4 C)  TempSrc: Oral  Oral Oral  SpO2:   98% 100%  Weight:  77.8 kg (171 lb 8.3 oz)    Height:       Physical Exam General: NAD - irritable  breathing easily Heart: RRR Lungs: grossly clear Abdomen: soft NT + bs Extremities: no LE edema/SCDs in place Dialysis Access: right upper AVF + bruit Skin: several skin tears - skin very fragile   Additional Objective Labs: Basic Metabolic Panel:  Recent Labs Lab 03/22/17 0533 03/23/17 1025 03/24/17 0424  NA 130* 131* 134*  K 3.7 3.9 4.0  CL 95* 96* 99*  CO2 28 26 25   GLUCOSE 112* 117* 110*  BUN 38* 68* 95*  CREATININE 3.99* 6.29* 7.34*  CALCIUM 8.4* 8.4* 8.4*  PHOS 3.5 4.9* 5.0*   Liver Function Tests:  Recent Labs Lab 03/17/17 1257 03/18/17 0245 03/20/17 0500 03/23/17 1025 03/24/17 0424  AST 17 12*  --   --   --   ALT 15* 14*  --   --   --   ALKPHOS 34* 33*  --   --   --  BILITOT 0.7 0.7  --   --   --   PROT 5.5* 5.9*  --   --   --   ALBUMIN 3.0* 2.7* 2.1* 2.4* 2.3*   CBC:  Recent Labs Lab 03/17/17 1257  03/20/17 0500 03/21/17 0545 03/22/17 0533 03/23/17 0404 03/24/17 0424  WBC 10.2  < > 15.0* 14.5* 13.3* 14.8* 15.9*  NEUTROABS 8.8*  --   --   --   --   --   --   HGB 8.1*  < > 7.3* 7.3* 8.6* 8.7* 8.5*  HCT 25.5*  < > 22.8* 23.4* 27.0* 26.6* 26.4*  MCV 96.2  < > 97.0 95.9 94.7 94.0 94.3  PLT 184  < > 219 224 232 238 258  < > = values in this interval not displayed. Blood Culture    Component Value Date/Time   SDES BLOOD LEFT ARM 03/18/2017 1017   SDES BLOOD LEFT HAND 03/18/2017 1017   SPECREQUEST IN PEDIATRIC BOTTLE Blood Culture adequate volume 03/18/2017 1017   SPECREQUEST IN PEDIATRIC BOTTLE Blood Culture adequate volume 03/18/2017 1017   CULT ESCHERICHIA COLI (A) 03/18/2017 1017    CULT NO GROWTH 5 DAYS 03/18/2017 1017   REPTSTATUS 03/21/2017 FINAL 03/18/2017 1017   REPTSTATUS 03/23/2017 FINAL 03/18/2017 1017    CBG:  Recent Labs Lab 03/23/17 1627 03/23/17 1942 03/24/17 0035 03/24/17 0454 03/24/17 0736  GLUCAP 330* 327* 196* 112* 94    Lab Results  Component Value Date   INR 2.20 03/24/2017   INR 2.06 03/23/2017   INR 2.01 03/22/2017   Studies/Results: No results found. Medications: . sodium chloride    . cefTRIAXone (ROCEPHIN)  IV 2 g (03/24/17 0825)   . aspirin  325 mg Oral Daily  . carvedilol  3.125 mg Oral BID WC  . chlorhexidine gluconate (MEDLINE KIT)  15 mL Mouth Rinse BID  . Chlorhexidine Gluconate Cloth  6 each Topical Daily  . doxercalciferol  4 mcg Intravenous Q M,W,F-HD  . famotidine  20 mg Oral QHS  . feeding supplement (NEPRO CARB STEADY)  237 mL Oral BID BM  . feeding supplement (PRO-STAT SUGAR FREE 64)  30 mL Oral BID  . gabapentin  100 mg Oral TID  . insulin aspart  0-9 Units Subcutaneous Q4H  . mouth rinse  15 mL Mouth Rinse QID  . multivitamin  1 tablet Oral QHS  . predniSONE  20 mg Oral Q breakfast  . sodium chloride flush  10-40 mL Intracatheter Q12H  . Warfarin - Pharmacist Dosing Inpatient   Does not apply [phone]

## 2017-03-24 NOTE — Consult Note (Signed)
Physical Medicine and Rehabilitation Consult   Reason for Consult: E coli bacteremia/MG crises with decline in mobility and ability to carry out ADL tasks.  Referring Physician:  Dr. Doyle Askew.    HPI: James David is a 81 y.o. male with history of HTN, A fib, ESRD-HD dependent since 5/18, T2DM, recent CVA with CIR stay d/c 02/15/17 at supervision level. History taken from chart review, wife, and some by patient.  He was evaluated by neurology and was found to have MG therefore started on Mestinon. He has not been able to tolerate this due to severe diarrhea, difficulty swallowing and continued to get weaker with decline in mobility. He was admitted for work up on 03/17/17 and started on IVIG for MG crises. He developed hypoxia with mild increase in WOB, was started on IV solumedrol and required intubation briefly from  7/3- 7/5 when he self extubated. He was found to be septic due to E coli bacteremia and treated with ceftriaxone.   He was found to have problems crossing eyes to midline while intubated and CTA/CT perfusion with question of pontomedullary junction infarct.   EEG negative for seizures. His respiratory status has been stable and currently tolerating dysphagia 2, thin liquids. 2 D echo showed markedly abnormal septal motion with EF 50-55%.  Therapy evaluations done showing significant deficits in mobility and ability to carry out ADL tasks. CIR recommended for follow up therapy.    Review of Systems  Constitutional: Positive for malaise/fatigue. Negative for fever.  HENT: Negative for hearing loss and tinnitus.   Eyes: Positive for blurred vision. Negative for double vision and photophobia.  Respiratory: Negative for cough, sputum production and shortness of breath.   Cardiovascular: Negative for chest pain and palpitations.  Gastrointestinal: Negative for constipation and heartburn.  Genitourinary: Negative for dysuria and urgency.  Musculoskeletal: Positive for falls  (multiple since discharge from rehab).  Skin: Negative for itching and rash.  Neurological: Positive for weakness. Negative for dizziness, sensory change, focal weakness and headaches.  All other systems reviewed and are negative.     Past Medical History:  Diagnosis Date  . Atrial fibrillation ([place])   . Chronic kidney disease, stage IV (severe) (HCC)    baseline creatinine 2-3  . DDD (degenerative disc disease)   . Diabetes mellitus without complication ([place])   . ESRD (end stage renal disease) on dialysis ([place])   . Hyperlipidemia   . Hypertension   . Melanoma ([place]) 2010   removed at [facility]  . Second degree heart block    a. s/p STJ dual chamber PPM - Dr [name]  . Stroke [facility]) 01/2017    Past Surgical History:  Procedure Laterality Date  . AV FISTULA PLACEMENT Right 05/31/2014   Procedure: Right Arm Brachiocephalic ARTERIOVENOUS FISTULA CREATION  ;  Surgeon: Conrad Sedgewick[place], MD;  Location: [place];  Service: Vascular;  Laterality: Right;  . CATARACT EXTRACTION W/ INTRAOCULAR LENS  IMPLANT, BILATERAL    . COLONOSCOPY    . EXCISION MELANOMA WITH SENTINEL LYMPH NODE BIOPSY Right 02/08/2016   Procedure: WIDE EXCISION RIGHT SHOULDER MELANOMA WITH RIGHT SENTINEL LYMPH NODE BIOPSY;  Surgeon: [name], MD;  Location: [place];  Service: General;  Laterality: Right;  . LAMINECTOMY    . PERMANENT PACEMAKER INSERTION N/A 01/20/2014   STJ Assurity dual chamber pacemaker implanted by Dr [name] for 2nd degree AV block    Family History  Problem Relation Age of Onset  . Pulmonary embolism Mother   .  Diabetes Mother   . Heart attack Father   . Diabetes Brother     Social History:  Married. Required supervision prior to admission and has been receiving HHPT since discharge. Was using walker/wheelchair for mobility PTA.  Was reports that he has never smoked. He has never used smokeless tobacco. He reports that he does not drink alcohol or use drugs.    Allergies  Allergen Reactions  .  Amlodipine Besylate Swelling    Angioedema   . Lisinopril Other (See Comments)    Renal insufficiency  . Statins Other (See Comments)    Muscle aches  . Ropinirole Other (See Comments)    Causes excessive grogginess the following morning after taking  . Tape Other (See Comments)    SKIN IS THIN AND TAPE WILL ACTUALLY TEAR IT; PLEASE USE COBAN WRAP INSTEAD!!   Medications Prior to Admission  Medication Sig Dispense Refill  . acetaminophen (TYLENOL) 500 MG tablet Take 500 mg by mouth every 8 (eight) hours as needed for mild pain. (pain/headaches)    . aspirin EC 81 MG tablet Take 81 mg by mouth at bedtime.    . calcium acetate (PHOSLO) 667 MG capsule Take 2 capsules (1,334 mg total) by mouth 3 (three) times daily. (Patient taking differently: Take 667-1,334 mg by mouth See admin instructions. 1,334 mg three times a day after meals and 667 mg with each snack) 180 capsule 0  . carvedilol (COREG) 3.125 MG tablet Take 1 tablet (3.125 mg total) by mouth 2 (two) times daily with a meal. 60 tablet 1  . ezetimibe (ZETIA) 10 MG tablet Take 1 tablet (10 mg total) by mouth daily. 30 tablet 1  . fenofibrate 160 MG tablet Take 1 tablet (160 mg total) by mouth daily. 30 tablet 0  . finasteride (PROSCAR) 5 MG tablet Take 1 tablet (5 mg total) by mouth daily. 30 tablet 1  . gabapentin (NEURONTIN) 100 MG capsule Take 1 capsule (100 mg total) by mouth at bedtime. 30 capsule 1  . glimepiride (AMARYL) 1 MG tablet Take 1 tablet (1 mg total) by mouth daily with breakfast. 30 tablet 1  . Hypromellose (ARTIFICIAL TEARS OP) Place 1 drop into both eyes 3 (three) times daily as needed (for dry eyes).     Marland Kitchen ipratropium (ATROVENT) 0.03 % nasal spray Place 2 sprays into both nostrils daily as needed for rhinitis.     . Multiple Vitamin (DAILY-VITE PO) Take 1 tablet by mouth at bedtime. WITH ZINC    . pyridostigmine (MESTINON) 60 MG tablet Take 1/2 to 1 pill po tid (Patient taking differently: Take 30 mg by mouth 2 (two)  times daily. BREAKFAST AND SUPPERTIME) 90 tablet 5  . warfarin (COUMADIN) 1 MG tablet 1 1/2 tab daily (Patient taking differently: Take 1-1.5 mg by mouth See admin instructions. 1.5 mg with supper on Sun/Tues/Thurs/Sat and 1 mg on Mon/Wed/Fri) 30 tablet 1  . multivitamin (RENA-VIT) TABS tablet Take 1 tablet by mouth at bedtime. (Patient not taking: Reported on 03/17/2017) 30 tablet 0  . rOPINIRole (REQUIP) 0.5 MG tablet Take 1 tablet (0.5 mg total) by mouth at bedtime. (Patient not taking: Reported on 03/17/2017) 30 tablet 1  . senna-docusate (SENOKOT-S) 8.6-50 MG tablet Take 2 tablets by mouth at bedtime. (Patient not taking: Reported on 03/04/2017)      Home: Home Living Family/patient expects to be discharged to:: Private residence Living Arrangements: Spouse/significant other Available Help at Discharge: Family, Available 24 hours/day Type of Home: Other(Comment) Home Access: Level  entry Home Layout: One level Bathroom Shower/Tub: Multimedia programmer: Handicapped height Bathroom Accessibility: Yes Home Equipment: Grab bars - tub/shower  Functional History: Prior Function Level of Independence: Independent with assistive device(s) Comments: ambulating with RW since DC from CIR. going to outpt therapy Functional Status:  Mobility: Bed Mobility Overal bed mobility: Needs Assistance Bed Mobility: Supine to Sit Rolling: Supervision Sidelying to sit: Mod assist Supine to sit: Mod assist Sit to supine: Mod assist General bed mobility comments: Cues for technqiue; Heavy mod assist to clear feet off EOB and elevat trunk to sit Transfers Overall transfer level: Needs assistance Equipment used: Rolling walker (2 wheeled) Transfers: Sit to/from Stand, Stand Pivot Transfers Sit to Stand: +2 physical assistance, Mod assist, From elevated surface Stand pivot transfers: +2 physical assistance, Max assist General transfer comment: Able to initiate power up to stand, however unable to  fully extend knees due to weakness; perfomred stand pivot transfer to chair with +2 max assist; knees blocked for stability Ambulation/Gait Ambulation/Gait assistance: Mod assist Ambulation Distance (Feet): 2 Feet Assistive device: Rolling walker (2 wheeled) Gait Pattern/deviations: Step-to pattern, Decreased stride length, Trunk flexed General Gait Details: three side steps up to head of bed with RW, assist for balance and walker management    ADL: ADL Overall ADL's : Needs assistance/impaired Eating/Feeding: Minimal assistance Eating/Feeding Details (indicate cue type and reason): Pt able to hold spoon and bring utensil to mouth. Attention affecting postural control during self feeding. Speech further assessing diet with MBS Grooming: Moderate assistance, Sitting Upper Body Bathing: Moderate assistance, Sitting Lower Body Bathing: Maximal assistance, Sit to/from stand Upper Body Dressing : Moderate assistance, Sitting Lower Body Dressing: Maximal assistance, Sit to/from stand Toilet Transfer: Moderate assistance, +2 for physical assistance (simulated) Toileting- Clothing Manipulation and Hygiene: Maximal assistance Functional mobility during ADLs: Moderate assistance, +2 for physical assistance  Cognition: Cognition Overall Cognitive Status: Impaired/Different from baseline Arousal/Alertness: Awake/alert Orientation Level: Oriented X4 Attention: Sustained Sustained Attention: Impaired Sustained Attention Impairment: Functional basic Memory: Impaired Memory Impairment: Decreased recall of new information Awareness: Impaired Awareness Impairment: Intellectual impairment, Emergent impairment, Anticipatory impairment Problem Solving: Impaired Problem Solving Impairment: Functional basic Executive Function: Self Monitoring Self Monitoring: Impaired Self Monitoring Impairment: Functional basic Safety/Judgment: Impaired Cognition Arousal/Alertness: Awake/alert Behavior During  Therapy: WFL for tasks assessed/performed Overall Cognitive Status: Impaired/Different from baseline Area of Impairment: Orientation, Attention, Memory, Following commands, Safety/judgement, Awareness, Problem solving Orientation Level: Disoriented to, Time Current Attention Level: Sustained Memory: Decreased short-term memory, Decreased recall of precautions Following Commands: Follows one step commands consistently Safety/Judgement: Decreased awareness of safety, Decreased awareness of deficits Awareness: Emergent Problem Solving: Slow processing  Blood pressure (!) 146/60, pulse 74, temperature 98.6 F (37 C), temperature source Oral, resp. rate 20, height 5\' 10"  (1.778 m), weight 77.8 kg (171 lb 8.3 oz), SpO2 98 %. Physical Exam  Nursing note and vitals reviewed. Constitutional: He is oriented to person, place, and time. He appears well-developed and well-nourished.  HENT:  Head: Normocephalic and atraumatic.  Mouth/Throat: Oropharynx is clear and moist.  Eyes: Conjunctivae and EOM are normal. Pupils are equal, round, and reactive to light. Right eye exhibits no discharge.  Neck: Normal range of motion. Neck supple.  Cardiovascular: Normal rate and regular rhythm.   Respiratory: Effort normal and breath sounds normal. No stridor. No respiratory distress. He has no wheezes.  GI: Soft. Bowel sounds are normal. He exhibits no distension. There is no tenderness.  Musculoskeletal: He exhibits no edema or tenderness.  Neurological: He is  alert and oriented to person, place, and time.  Motor: 4-/5 throughout Sensation intact to light touch  Skin: Skin is warm and dry.  Multiple ecchymotic areas on BUE.  Right IJ with dressing c/d/i  Psychiatric: He has a normal mood and affect. His behavior is normal.    Results for orders placed or performed during the hospital encounter of 03/17/17 (from the past 24 hour(s))  Renal function panel     Status: Abnormal   Collection Time: 03/23/17  10:25 AM  Result Value Ref Range   Sodium 131 (L) 135 - 145 mmol/L   Potassium 3.9 3.5 - 5.1 mmol/L   Chloride 96 (L) 101 - 111 mmol/L   CO2 26 22 - 32 mmol/L   Glucose, Bld 117 (H) 65 - 99 mg/dL   BUN 68 (H) 6 - 20 mg/dL   Creatinine, Ser 6.29 (H) 0.61 - 1.24 mg/dL   Calcium 8.4 (L) 8.9 - 10.3 mg/dL   Phosphorus 4.9 (H) 2.5 - 4.6 mg/dL   Albumin 2.4 (L) 3.5 - 5.0 g/dL   GFR calc non Af Amer 8 (L) >60 mL/min   GFR calc Af Amer 9 (L) >60 mL/min   Anion gap 9 5 - 15  Glucose, capillary     Status: Abnormal   Collection Time: 03/23/17 11:11 AM  Result Value Ref Range   Glucose-Capillary 139 (H) 65 - 99 mg/dL   Comment 1 Notify RN    Comment 2 Document in Chart   Glucose, capillary     Status: Abnormal   Collection Time: 03/23/17  4:27 PM  Result Value Ref Range   Glucose-Capillary 330 (H) 65 - 99 mg/dL   Comment 1 Notify RN    Comment 2 Document in Chart   Glucose, capillary     Status: Abnormal   Collection Time: 03/23/17  7:42 PM  Result Value Ref Range   Glucose-Capillary 327 (H) 65 - 99 mg/dL   Comment 1 Notify RN    Comment 2 Document in Chart   Glucose, capillary     Status: Abnormal   Collection Time: 03/24/17 12:35 AM  Result Value Ref Range   Glucose-Capillary 196 (H) 65 - 99 mg/dL   Comment 1 Notify RN    Comment 2 Document in Chart   Protime-INR     Status: Abnormal   Collection Time: 03/24/17  4:24 AM  Result Value Ref Range   Prothrombin Time 24.8 (H) 11.4 - 15.2 seconds   INR 2.20   CBC     Status: Abnormal   Collection Time: 03/24/17  4:24 AM  Result Value Ref Range   WBC 15.9 (H) 4.0 - 10.5 K/uL   RBC 2.80 (L) 4.22 - 5.81 MIL/uL   Hemoglobin 8.5 (L) 13.0 - 17.0 g/dL   HCT 26.4 (L) 39.0 - 52.0 %   MCV 94.3 78.0 - 100.0 fL   MCH 30.4 26.0 - 34.0 pg   MCHC 32.2 30.0 - 36.0 g/dL   RDW 14.3 11.5 - 15.5 %   Platelets 258 150 - 400 K/uL  Renal function panel     Status: Abnormal   Collection Time: 03/24/17  4:24 AM  Result Value Ref Range   Sodium 134  (L) 135 - 145 mmol/L   Potassium 4.0 3.5 - 5.1 mmol/L   Chloride 99 (L) 101 - 111 mmol/L   CO2 25 22 - 32 mmol/L   Glucose, Bld 110 (H) 65 - 99 mg/dL   BUN 95 (H) 6 -  20 mg/dL   Creatinine, Ser 7.34 (H) 0.61 - 1.24 mg/dL   Calcium 8.4 (L) 8.9 - 10.3 mg/dL   Phosphorus 5.0 (H) 2.5 - 4.6 mg/dL   Albumin 2.3 (L) 3.5 - 5.0 g/dL   GFR calc non Af Amer 6 (L) >60 mL/min   GFR calc Af Amer 7 (L) >60 mL/min   Anion gap 10 5 - 15  Glucose, capillary     Status: Abnormal   Collection Time: 03/24/17  4:54 AM  Result Value Ref Range   Glucose-Capillary 112 (H) 65 - 99 mg/dL   Comment 1 Notify RN    Comment 2 Document in Chart   Glucose, capillary     Status: None   Collection Time: 03/24/17  7:36 AM  Result Value Ref Range   Glucose-Capillary 94 65 - 99 mg/dL   Comment 1 Notify RN    Comment 2 Document in Chart    No results found.  Assessment/Plan: Diagnosis: MG crises/bacteremia Labs and images independently reviewed.  Records reviewed and summated above.  1. Does the need for close, 24 hr/day medical supervision in concert with the patient's rehab needs make it unreasonable for this patient to be served in a less intensive setting? Potentially  2. Co-Morbidities requiring supervision/potential complications: HTN (monitor and provide prns in accordance with increased physical exertion and pain), A fib (monitor HR with increased physical activity), ESRD-HD dependent since 5/18, T2 DM, extremely labile at present (Monitor in accordance with exercise and adjust meds as necessary), recent CVA, dysphagia (advance diet as tolerated), leukocytosis (cont to monitor for signs and symptoms of infection, further workup if indicated) 3. Due to safety, disease management and patient education, does the patient require 24 hr/day rehab nursing? Yes 4. Does the patient require coordinated care of a physician, rehab nurse, PT (1-2 hrs/day, 5 days/week), OT (1-2 hrs/day, 5 days/week) and SLP (1-2 hrs/day, 5  days/week) to address physical and functional deficits in the context of the above medical diagnosis(es)? Yes Addressing deficits in the following areas: balance, endurance, locomotion, strength, transferring, bathing, dressing, toileting and psychosocial support 5. Can the patient actively participate in an intensive therapy program of at least 3 hrs of therapy per day at least 5 days per week? Potentially 6. The potential for patient to make measurable gains while on inpatient rehab is excellent 7. Anticipated functional outcomes upon discharge from inpatient rehab are supervision and min assist  with PT, supervision and min assist with OT, n/a with SLP. 8. Estimated rehab length of stay to reach the above functional goals is: 14-17 days. 9. Anticipated D/C setting: Other 10. Anticipated post D/C treatments: SNF 11. Overall Rehab/Functional Prognosis: good  RECOMMENDATIONS: This patient's condition is appropriate for continued rehabilitative care in the following setting: Recommend, CIR, however, wife states she cannot provide any assistance at discharge and that she would like a place closer to home in [place] where the patient will be able to stay for a longer course of rehab. Patient has agreed to participate in recommended program. Potentially Note that insurance prior authorization may be required for reimbursement for recommended care.  Comment: Rehab Admissions Coordinator to follow up.  Delice Lesch, MD, [place], [place] 03/24/2017

## 2017-03-24 NOTE — Progress Notes (Signed)
Occupational Therapy Treatment Patient Details Name: James David MRN: [id number] DOB: February 20, 1936 Today's Date: 03/24/2017    History of present illness 81 y.o. male with medical history significant for diabetes mellitus 2 on oral agents, chronic kidney disease on dialysis, atrial fibrillation on chronic warfarin, anemia of chronic kidney disease, dyslipidemia. Recent admission for right subcortical stroke mid May 2018. Patient completed therapy in CIR and was discharged home at that time he was able to mobilize with the assistance of a rolling walker. Diagnosed  mid June with myasthenia gravis. Developed respiratory distress 7/3 and was intubated. Self extubated 7/5. CT - no sign of acute infarct.    OT comments  Pt making steady progress. Able to complete grooming tasks while sitting EOB with moderate assistance to maintain postural control due to apparent motor impersistance. on. PT does well with using the "stedy" to transfer into chair with staff. Continue to recommend rehab at [facility]. Will continue to follow acutely to maximize functional level of independence and facilitate DC home with 24/7 care. Pt very appreciative. Wife present for session.   Follow Up Recommendations  CIR;Supervision/Assistance - 24 hour    Equipment Recommendations  Other (comment)    Recommendations for Other Services Rehab consult    Precautions / Restrictions Precautions Precautions: Fall       Mobility Bed Mobility Overal bed mobility: Needs Assistance Bed Mobility: Supine to Sit   Sidelying to sit: Mod assist       General bed mobility comments: facilitation behind shoulder and to shift weight through UE to achieve upright sitting. VC and tactile cues to sustain B feet on floor.  Transfers Overall transfer level: Needs assistance Equipment used:  Denna Haggard) Transfers: Sit to/from Stand Sit to Stand: Mod assist;+2 physical assistance Stand pivot transfers: +2 physical assistance (Stedy used)        General transfer comment: Able to initiate sit - stand. Mad physical caues to achieve midlien upright posture, but unable to sustain    Balance     Sitting balance-Leahy Scale: Poor       Standing balance-Leahy Scale: Poor                             ADL either performed or assessed with clinical judgement   ADL Overall ADL's : Needs assistance/impaired     Grooming: Minimal assistance   Upper Body Bathing: Minimal assistance;Sitting                           Functional mobility during ADLs: Moderate assistance;+2 for physical assistance General ADL Comments: Pt sat EOB to complete grooming tasks with facilitation for midline postural control. Pt demonstrates motor impersistance during functional activities, with anterior and lateral lean.  Pt able to lean forward to rub lotion on legs without losing balance. L bias.     Vision   Additional Comments: will further assess   Perception     Praxis      Cognition Arousal/Alertness: Awake/alert Behavior During Therapy: WFL for tasks assessed/performed Overall Cognitive Status: Impaired/Different from baseline Area of Impairment: Attention;Memory;Following commands;Safety/judgement;Awareness;Problem solving                   Current Attention Level: Sustained Memory: Decreased recall of precautions;Decreased short-term memory Following Commands: Follows one step commands consistently Safety/Judgement: Decreased awareness of safety;Decreased awareness of deficits Awareness: Emergent Problem Solving: Slow processing;Decreased initiation;Difficulty sequencing;Requires verbal cues;Requires tactile  cues General Comments: postural control affected by attentional deficits.         Exercises     Shoulder Instructions       General Comments      Pertinent Vitals/ Pain       Pain Assessment: No/denies pain  Home Living                                           Prior Functioning/Environment              Frequency  Min 3X/week        Progress Toward Goals  OT Goals(current goals can now be found in the care plan section)  Progress towards OT goals: Progressing toward goals  Acute Rehab OT Goals Patient Stated Goal: to get better OT Goal Formulation: With patient Time For Goal Achievement: 04/03/17 Potential to Achieve Goals: Good ADL Goals Pt Will Perform Eating: with set-up;with supervision;sitting Pt Will Perform Grooming: with set-up;with supervision;sitting Pt Will Perform Upper Body Bathing: with set-up;with supervision;sitting Pt Will Perform Lower Body Bathing: with min assist;sit to/from stand Pt Will Transfer to Toilet: with min guard assist;bedside commode;ambulating Additional ADL Goal #1: Pt will demonstrate anticipatory awareness during ADL task with min vc in nondistracting environment  Plan Discharge plan remains appropriate    Co-evaluation    PT/OT/SLP Co-Evaluation/Treatment: Yes Reason for Co-Treatment: Complexity of the patient's impairments (multi-system involvement);Necessary to address cognition/behavior during functional activity;To address functional/ADL transfers   OT goals addressed during session: ADL's and self-care      AM-PAC PT "6 Clicks" Daily Activity     Outcome Measure   Help from another person eating meals?: A Little Help from another person taking care of personal grooming?: A Little Help from another person toileting, which includes using toliet, bedpan, or urinal?: A Lot Help from another person bathing (including washing, rinsing, drying)?: A Lot Help from another person to put on and taking off regular upper body clothing?: A Lot Help from another person to put on and taking off regular lower body clothing?: A Lot 6 Click Score: 14    End of Session Equipment Utilized During Treatment: Gait belt  OT Visit Diagnosis: Unsteadiness on feet (R26.81);Other abnormalities of gait  and mobility (R26.89);Muscle weakness (generalized) (M62.81);Other symptoms and signs involving cognitive function   Activity Tolerance Patient tolerated treatment well   Patient Left in chair;with call bell/phone within reach;with chair alarm set;with family/visitor present   Nurse Communication Mobility status        Time: 9675-9163 OT Time Calculation (min): 31 min  Charges: OT General Charges $OT Visit: 1 Procedure OT Treatments $Self Care/Home Management : 8-22 mins  [facility], OT/L  [phone] 03/24/2017   Breanda Greenlaw,H[place] 03/24/2017, 1:27 PM

## 2017-03-24 NOTE — Progress Notes (Deleted)
[facility] Progress Note  Subjective:    No new c/os this am. No SOB, cough, abd pain.    Objective Vitals:   03/24/17 0144 03/24/17 0500 03/24/17 0540 03/24/17 0917  BP:   (!) 146/60 (!) 162/100  Pulse:   74 86  Resp: _0 Temp: 98.4 F (36.9 C)  98.6 F (37 C) 97.6 F (36.4 C)  TempSrc: Oral  Oral Oral  SpO2:   98% 100%  Weight:  77.8 kg (171 lb 8.3 oz)    Height:       Physical Exam WNWD male  TLC in R IJ dry dressing S1S2 No S3 No audible murmur Lungs clear anteriorly Abd soft ND/NT  No LE edema  SCD's in place  R upper AVF +bruit  NF, gen'd weakness, A&Ox 3  Dialysis: MWF AF 4h  2/2.25 bath  80kg  Hep none  RUA AVF  - hectorol 4  - venofer 50  - 6/25 mircera 75 6/20 - just ^ to 100 for next dose Recent labs: hgb 8.9 INR ok 25% sat ferritin 135 May ipTH 291  Background: 81 y.o. male with ESRD on HD since Jan 18, 2017 PMHx CVA and UTI, HTN, afib, right should melanoma, cervical disc disease, 2nd degree heart block s/p PPM, DM, myasthenia gravis. Admitted 7/2 with fever, hypoxia, MG crisis (related to intolerance to mestinon), acute hypoxemic resp failure (sepsis/MG crisis, +/-pulm edema), + BC for [facility]. Required intubation for resp fx. Self extubated   Assessment: 1. Acute respiratory failure d/t MG crisis/ sepsis - admit CXR normal 7/2.  Self extubated. Stable since 2. EColi urosepsis - ucx and 1/2 blood cx's + EColi, pan sensitive, on Rocephin and afebrile 3. ESRD MWF HD 4. Volume - lowest wt here 76kg, consider lower edw to same 5. Anemia- Fe def anemia/heme. Dosed Aranesp 100 on 7/3. 1 unit PRBC's with HD 7/6 for Hb of 7.2 . Trending up 8.7 No Fe for now given bacteremia  6. Metabolic bone disease-  hectorol IV with HD. No binders yet 7. Nutrition -- nepro/prostat DYS 2 diet  8. Supra therapeutic INR - no heparin HD -  INR elevated on admission required Vit K. Therapeutic today at 2.06. Pharmacy dosing coumadin  9. Myasthenia  gravis - SP IV steroids , now on po prednisone 20/day. Intolerant to mestinon. 10. Possible stroke - neuro not sure if artifact but feel "prudent to assume it is real, small enough that does not preclude anticoagulation 11. Frequent falls - PT working with him. Unclear right now if will be SNF vs CIR.  12. DM- per primary  Plan - HD today, UF 3 L as Ronnie Derby MD [employer] pgr [phone]   03/24/2017, 10:57 AM        Recent Labs Lab 03/22/17 0533 03/23/17 1025 03/24/17 0424  NA 130* 131* 134*  K 3.7 3.9 4.0  CL 95* 96* 99*  CO2 _1 GLUCOSE 112* 117* 110*  BUN 38* 68* 95*  CREATININE 3.99* 6.29* 7.34*  CALCIUM 8.4* 8.4* 8.4*  PHOS 3.5 4.9* 5.0*     Recent Labs Lab 03/17/17 1257 03/18/17 0245 03/20/17 0500 03/23/17 1025 03/24/17 0424  AST 17 12*  --   --   --   ALT 15* 14*  --   --   --   ALKPHOS 34* 33*  --   --   --   BILITOT 0.7 0.7  --   --   --  PROT 5.5* 5.9*  --   --   --   ALBUMIN 3.0* 2.7* 2.1* 2.4* 2.3*    Recent Labs Lab 03/17/17 1257  03/20/17 0500 03/21/17 0545 03/22/17 0533 03/23/17 0404 03/24/17 0424  WBC 10.2  < > 15.0* 14.5* 13.3* 14.8* 15.9*  NEUTROABS 8.8*  --   --   --   --   --   --   HGB 8.1*  < > 7.3* 7.3* 8.6* 8.7* 8.5*  HCT 25.5*  < > 22.8* 23.4* 27.0* 26.6* 26.4*  MCV 96.2  < > 97.0 95.9 94.7 94.0 94.3  PLT 184  < > 219 224 232 238 258  < > = values in this interval not displayed. Blood Culture    Component Value Date/Time   SDES BLOOD LEFT ARM 03/18/2017 1017   SDES BLOOD LEFT HAND 03/18/2017 1017   SPECREQUEST IN PEDIATRIC BOTTLE Blood Culture adequate volume 03/18/2017 1017   SPECREQUEST IN PEDIATRIC BOTTLE Blood Culture adequate volume 03/18/2017 1017   CULT ESCHERICHIA COLI (A) 03/18/2017 1017   CULT NO GROWTH 5 DAYS 03/18/2017 1017   REPTSTATUS 03/21/2017 FINAL 03/18/2017 1017   REPTSTATUS 03/23/2017 FINAL 03/18/2017 1017     Recent Labs Lab 03/23/17 1627 03/23/17 1942  03/24/17 0035 03/24/17 0454 03/24/17 0736  GLUCAP 330* 327* 196* 112* 94   Iron Studies: No results for input(s): IRON, TIBC, TRANSFERRIN, FERRITIN in the last 72 hours. Lab Results  Component Value Date   INR 2.20 03/24/2017   INR 2.06 03/23/2017   INR 2.01 03/22/2017   Medications: . sodium chloride    . cefTRIAXone (ROCEPHIN)  IV 2 g (03/24/17 0825)   . aspirin  325 mg Oral Daily  . carvedilol  3.125 mg Oral BID WC  . chlorhexidine gluconate (MEDLINE KIT)  15 mL Mouth Rinse BID  . Chlorhexidine Gluconate Cloth  6 each Topical Daily  . [START ON 03/26/2017] darbepoetin (ARANESP) injection - DIALYSIS  100 mcg Intravenous Q Wed-HD  . doxercalciferol  4 mcg Intravenous Q M,W,F-HD  . famotidine  20 mg Oral QHS  . feeding supplement (NEPRO CARB STEADY)  237 mL Oral BID BM  . feeding supplement (PRO-STAT SUGAR FREE 64)  30 mL Oral BID  . gabapentin  100 mg Oral TID  . insulin aspart  0-9 Units Subcutaneous Q4H  . mouth rinse  15 mL Mouth Rinse QID  . multivitamin  1 tablet Oral QHS  . predniSONE  20 mg Oral Q breakfast  . sodium chloride flush  10-40 mL Intracatheter Q12H  . Warfarin - Pharmacist Dosing Inpatient   Does not apply [phone]

## 2017-03-24 NOTE — Progress Notes (Signed)
ANTICOAGULATION CONSULT NOTE - Follow Up Consult  Pharmacy Consult for Coumadin Indication: atrial fibrillation  Allergies  Allergen Reactions  . Amlodipine Besylate Swelling    Angioedema   . Lisinopril Other (See Comments)    Renal insufficiency  . Statins Other (See Comments)    Muscle aches  . Ropinirole Other (See Comments)    Causes excessive grogginess the following morning after taking  . Tape Other (See Comments)    SKIN IS THIN AND TAPE WILL ACTUALLY TEAR IT; PLEASE USE COBAN WRAP INSTEAD!!    Patient Measurements: Height: 5\' 10"  (177.8 cm) Weight: 171 lb 8.3 oz (77.8 kg) IBW/kg (Calculated) : 73  Vital Signs: Temp: 97.6 F (36.4 C) (07/09 0917) Temp Source: Oral (07/09 0917) BP: 162/100 (07/09 0917) Pulse Rate: 86 (07/09 0917)  Labs:  Recent Labs  03/22/17 0533 03/23/17 0404 03/23/17 1025 03/24/17 0424  HGB 8.6* 8.7*  --  8.5*  HCT 27.0* 26.6*  --  26.4*  PLT 232 238  --  258  LABPROT 23.0* 23.6*  --  24.8*  INR 2.01 2.06  --  2.20  CREATININE 3.99*  --  6.29* 7.34*   ESRD  Assessment:  Patient is on chronic Coumadin therapy PTA for atrial fibrillation. Supratherapeutic INR (5/9) on admission 03/17/17. and received Vitamin K. Coumadin resumed on 7/6 and INR remains therapeutic at 2.20 today. On Aspirin 325 mg daily since 03/19/17.   Hgb low stable.  Aranesp 100 mcg given on 7/3, next due 7/11 with hemodialysis.  PTA Coumadin regimen: 1.5 mg TTSS and 1 mg on MWF   Goal of Therapy:  INR 2-3 Monitor platelets by anticoagulation protocol: Yes   Plan:   Coumadin 1 mg today.  Daily PT/INR.  Decrease Aspirin from 325 to 81 mg as prior to admission?  Arty Baumgartner, [place] Pager: [phone] 03/24/2017,12:05 PM

## 2017-03-24 NOTE — Care Management Important Message (Signed)
Important Message  Patient Details  Name: James David MRN: [id number] Date of Birth: Aug 27, 1936   Medicare Important Message Given:  Yes    Edwin Cherian 03/24/2017, 3:00 PM

## 2017-03-24 NOTE — Progress Notes (Signed)
Patient ID: James David, male   DOB: [birth year], 81 y.o.   MRN: [id number]    PROGRESS NOTE  James David  JSR:[id number] DOB: 1935/12/12 DOA: 03/17/2017  PCP: [name], MD   Brief Narrative:  81 y.o. male with a PMH as outlined below including but not limited to recent diagnosis of probable Myasthenia Gravis (was seen as outpatient and had positive acetylcholine receptor antibodies).  He was started on mestinon but did not tolerate it due to severe diarrhea.  He has had progressive weakness for the past month or two to the point where he has had difficulty getting out of bed and has had difficulty swallowing.  He has not had any diplopia or SOB at home.  He was admitted 7/2 with generalized weakness.  He was evaluated by neurology who felt that he likely did have Myasthenia and recommended treatment with IVIG for 5 daily doses (holding steroids for a few days).  On 7/3, he had hypoxia with mild increased WOB; therefore, PCCM was asked to see.  He was placed on NRB which he feels is helping.  He has strong cough and is able to control airway well.  He denies sensation of respiratory fatigue.  He is awaiting his scheduled hemodialysis (he did receive 41m lasix prior to [place] him.  Daly urine documented as - 942.  SIGNIFICANT EVENTS  7/2 > admit. 7/3 > PCCM consult.   Has mild dyspnea but does not feel tired.  Able to follow commands.  Good cough, controlling airway well. 7/7 > TRH asked to assume care  Assessment & Plan: Acute respiratory failure d/t/ MG crisis/sepsis/volume  - respiratory status stable, oxygen saturation at target range  - no dyspnea this AM - pt reports feeling better this AM  E. Coli urosepsis  - on Rocephin, WBC is trending down 15 --> 14 --> 13 --> 14 --> 15K - no fevers, not clear why WBC up, > steroids  - repeat CBC in AM - repeat blood cultures pending   ESRD on HD - on schedule MWF - per nephrology   Iron deficiency anemia - Hg up from  7.3 --> 8.6 --> 8.7 --> 8.5 - no signs of active bleeding  - CBC in AM  Myasthenia gravis  - IVIG completed per neuro  - plan to start on low dose Prednisone 20 mg PO QD today  - PT eval done, plan for [facility] re[facility]  DVT prophylaxis: Coumadin  Code Status: Full  Family Communication: Patient and wife at bedside  Disposition Plan: inpt rehab when bed available   Consultants:   Neurology  Nephrology   Antimicrobials:   Rocephin   Subjective: No concerns this AM.   Objective: Vitals:   03/24/17 0500 03/24/17 0540 03/24/17 0917 03/24/17 1400  BP:  (!) 146/60 (!) 162/100 (!) 148/73  Pulse:  74 86 88  Resp:  20 18 18   Temp:  98.6 F (37 C) 97.6 F (36.4 C) 97.6 F (36.4 C)  TempSrc:  Oral Oral Oral  SpO2:  98% 100% 100%  Weight: 77.8 kg (171 lb 8.3 oz)     Height:        Intake/Output Summary (Last 24 hours) at 03/24/17 1459 Last data filed at 03/24/17 0504  Gross per 24 hour  Intake              150 ml  Output              715 ml  Net             -565 ml   Filed Weights   03/22/17 0500 03/23/17 0500 03/24/17 0500  Weight: 76.4 kg (168 lb 6.9 oz) 76 kg (167 lb 8.8 oz) 77.8 kg (171 lb 8.3 oz)    Physical Exam  Constitutional: Appears well-developed and well-nourished. No distress.  CVS: RRR, S1/S2 +, no gallops, no carotid bruit.  Pulmonary: Effort and breath sounds normal, no stridor, rhonchi, wheezes, rales.  Abdominal: Soft. BS +,  no distension, tenderness, rebound or guarding.  Musculoskeletal: Normal range of motion. No edema and no tenderness.    Data Reviewed: I have personally reviewed following labs and imaging studies  CBC:  Recent Labs Lab 03/20/17 0500 03/21/17 0545 03/22/17 0533 03/23/17 0404 03/24/17 0424  WBC 15.0* 14.5* 13.3* 14.8* 15.9*  HGB 7.3* 7.3* 8.6* 8.7* 8.5*  HCT 22.8* 23.4* 27.0* 26.6* 26.4*  MCV 97.0 95.9 94.7 94.0 94.3  PLT 219 224 232 238 563   Basic Metabolic Panel:  Recent Labs Lab 03/19/17 0315 03/19/17 1658  03/20/17 0500 03/21/17 0545 03/22/17 0533 03/23/17 1025 03/24/17 0424  NA 132*  --  131* 130* 130* 131* 134*  K 4.6  --  3.9 4.2 3.7 3.9 4.0  CL 96*  --  96* 95* 95* 96* 99*  CO2 24  --  25 24 28 26 25   GLUCOSE 211*  --  270* 219* 112* 117* 110*  BUN 23*  --  40* 63* 38* 68* 95*  CREATININE 5.45*  --  4.26* 6.28* 3.99* 6.29* 7.34*  CALCIUM 7.6*  --  7.9* 8.4* 8.4* 8.4* 8.4*  MG 1.8 2.6* 2.7* 2.7* 2.2  --   --   PHOS 4.6 3.9 4.6 4.7* 3.5 4.9* 5.0*   Liver Function Tests:  Recent Labs Lab 03/18/17 0245 03/20/17 0500 03/23/17 1025 03/24/17 0424  AST 12*  --   --   --   ALT 14*  --   --   --   ALKPHOS 33*  --   --   --   BILITOT 0.7  --   --   --   PROT 5.9*  --   --   --   ALBUMIN 2.7* 2.1* 2.4* 2.3*   Coagulation Profile:  Recent Labs Lab 03/20/17 0500 03/21/17 0545 03/22/17 0533 03/23/17 0404 03/24/17 0424  INR 1.62 2.33 2.01 2.06 2.20   HbA1C: No results for input(s): HGBA1C in the last 72 hours. CBG:  Recent Labs Lab 03/23/17 1942 03/24/17 0035 03/24/17 0454 03/24/17 0736 03/24/17 1138  GLUCAP 327* 196* 112* 94 185*   Urine analysis:    Component Value Date/Time   COLORURINE YELLOW 03/18/2017 0945   APPEARANCEUR TURBID (A) 03/18/2017 0945   LABSPEC 1.012 03/18/2017 0945   PHURINE 6.0 03/18/2017 0945   GLUCOSEU NEGATIVE 03/18/2017 0945   HGBUR LARGE (A) 03/18/2017 0945   BILIRUBINUR NEGATIVE 03/18/2017 0945   KETONESUR NEGATIVE 03/18/2017 0945   PROTEINUR 100 (A) 03/18/2017 0945   NITRITE NEGATIVE 03/18/2017 0945   LEUKOCYTESUR MODERATE (A) 03/18/2017 0945   Recent Results (from the past 240 hour(s))  Culture, Urine     Status: Abnormal   Collection Time: 03/18/17  9:44 AM  Result Value Ref Range Status   Specimen Description URINE, CATHETERIZED  Final   Special Requests NONE  Final   Culture >=100,000 COLONIES/mL ESCHERICHIA COLI (A)  Final   Report Status 03/20/2017 FINAL  Final   Organism ID, Bacteria ESCHERICHIA COLI (A)  Final       Susceptibility   Escherichia coli - MIC*    AMPICILLIN 8 SENSITIVE Sensitive     CEFAZOLIN <=4 SENSITIVE Sensitive     CEFTRIAXONE <=1 SENSITIVE Sensitive     CIPROFLOXACIN <=0.25 SENSITIVE Sensitive     GENTAMICIN <=1 SENSITIVE Sensitive     IMIPENEM <=0.25 SENSITIVE Sensitive     NITROFURANTOIN <=16 SENSITIVE Sensitive     TRIMETH/SULFA <=20 SENSITIVE Sensitive     AMPICILLIN/SULBACTAM 4 SENSITIVE Sensitive     PIP/TAZO <=4 SENSITIVE Sensitive     Extended ESBL NEGATIVE Sensitive     * >=100,000 COLONIES/mL ESCHERICHIA COLI  Culture, blood (routine x 2)     Status: Abnormal   Collection Time: 03/18/17 10:17 AM  Result Value Ref Range Status   Specimen Description BLOOD LEFT ARM  Final   Special Requests IN PEDIATRIC BOTTLE Blood Culture adequate volume  Final   Culture  Setup Time   Final    PED GRAM NEGATIVE RODS CRITICAL RESULT CALLED TO, READ BACK BY AND VERIFIED WITH: TO KCOOK(PHARMd) BY TCLEVELAND 03/19/2017 AT 1:25AM    Culture ESCHERICHIA COLI (A)  Final   Report Status 03/21/2017 FINAL  Final   Organism ID, Bacteria ESCHERICHIA COLI  Final      Susceptibility   Escherichia coli - MIC*    AMPICILLIN 8 SENSITIVE Sensitive     CEFAZOLIN <=4 SENSITIVE Sensitive     CEFEPIME <=1 SENSITIVE Sensitive     CEFTAZIDIME <=1 SENSITIVE Sensitive     CEFTRIAXONE <=1 SENSITIVE Sensitive     CIPROFLOXACIN <=0.25 SENSITIVE Sensitive     GENTAMICIN <=1 SENSITIVE Sensitive     IMIPENEM <=0.25 SENSITIVE Sensitive     TRIMETH/SULFA <=20 SENSITIVE Sensitive     AMPICILLIN/SULBACTAM 4 SENSITIVE Sensitive     PIP/TAZO <=4 SENSITIVE Sensitive     Extended ESBL NEGATIVE Sensitive     * ESCHERICHIA COLI  Culture, blood (routine x 2)     Status: None   Collection Time: 03/18/17 10:17 AM  Result Value Ref Range Status   Specimen Description BLOOD LEFT HAND  Final   Special Requests IN PEDIATRIC BOTTLE Blood Culture adequate volume  Final   Culture NO GROWTH 5 DAYS  Final   Report Status  03/23/2017 FINAL  Final  Blood Culture ID Panel (Reflexed)     Status: Abnormal   Collection Time: 03/18/17 10:17 AM  Result Value Ref Range Status   Enterococcus species NOT DETECTED NOT DETECTED Final   Listeria monocytogenes NOT DETECTED NOT DETECTED Final   Staphylococcus species NOT DETECTED NOT DETECTED Final   Staphylococcus aureus NOT DETECTED NOT DETECTED Final   Streptococcus species NOT DETECTED NOT DETECTED Final   Streptococcus agalactiae NOT DETECTED NOT DETECTED Final   Streptococcus pneumoniae NOT DETECTED NOT DETECTED Final   Streptococcus pyogenes NOT DETECTED NOT DETECTED Final   Acinetobacter baumannii NOT DETECTED NOT DETECTED Final   Enterobacteriaceae species DETECTED (A) NOT DETECTED Final    Comment: Enterobacteriaceae represent a large family of gram-negative bacteria, not a single organism. CRITICAL RESULT CALLED TO, READ BACK BY AND VERIFIED WITH: TO KCOOK(PHARMd) BY TCLEVELAND 03/19/2017 AT 1:25AM    Enterobacter cloacae complex NOT DETECTED NOT DETECTED Final   Escherichia coli DETECTED (A) NOT DETECTED Final    Comment: CRITICAL RESULT CALLED TO, READ BACK BY AND VERIFIED WITH: TO KCOOK(PHARMd) BY TCLEVELAND 03/19/2017 AT 1:25AM    Klebsiella oxytoca NOT DETECTED NOT DETECTED Final   Klebsiella pneumoniae NOT DETECTED  NOT DETECTED Final   Proteus species NOT DETECTED NOT DETECTED Final   Serratia marcescens NOT DETECTED NOT DETECTED Final   Carbapenem resistance NOT DETECTED NOT DETECTED Final   Haemophilus influenzae NOT DETECTED NOT DETECTED Final   Neisseria meningitidis NOT DETECTED NOT DETECTED Final   Pseudomonas aeruginosa NOT DETECTED NOT DETECTED Final   Candida albicans NOT DETECTED NOT DETECTED Final   Candida glabrata NOT DETECTED NOT DETECTED Final   Candida krusei NOT DETECTED NOT DETECTED Final   Candida parapsilosis NOT DETECTED NOT DETECTED Final   Candida tropicalis NOT DETECTED NOT DETECTED Final  MRSA PCR Screening     Status: None    Collection Time: 03/19/17  2:18 AM  Result Value Ref Range Status   MRSA by PCR NEGATIVE NEGATIVE Final    Comment:        The GeneXpert MRSA Assay (FDA approved for NASAL specimens only), is one component of a comprehensive MRSA colonization surveillance program. It is not intended to diagnose MRSA infection nor to guide or monitor treatment for MRSA infections.       Radiology Studies: No results found.    Scheduled Meds: . aspirin  325 mg Oral Daily  . carvedilol  3.125 mg Oral BID WC  . chlorhexidine gluconate (MEDLINE KIT)  15 mL Mouth Rinse BID  . Chlorhexidine Gluconate Cloth  6 each Topical Daily  . [START ON 03/26/2017] darbepoetin (ARANESP) injection - DIALYSIS  100 mcg Intravenous Q Wed-HD  . doxercalciferol  4 mcg Intravenous Q M,W,F-HD  . famotidine  20 mg Oral QHS  . feeding supplement (NEPRO CARB STEADY)  237 mL Oral BID BM  . feeding supplement (PRO-STAT SUGAR FREE 64)  30 mL Oral BID  . gabapentin  100 mg Oral TID  . insulin aspart  0-9 Units Subcutaneous Q4H  . mouth rinse  15 mL Mouth Rinse QID  . multivitamin  1 tablet Oral QHS  . predniSONE  20 mg Oral Q breakfast  . sodium chloride flush  10-40 mL Intracatheter Q12H  . warfarin  1 mg Oral ONCE-1800  . Warfarin - Pharmacist Dosing Inpatient   Does not apply q1800   Continuous Infusions: . sodium chloride    . cefTRIAXone (ROCEPHIN)  IV 2 g (03/24/17 0825)     LOS: 7 days   Time spent: 25 minutes   Faye Ramsay, MD [facility] Pager [phone]  If 7PM-7AM, please contact night-coverage [email or web address] Password [email or web address] 03/24/2017, 2:59 PM

## 2017-03-24 NOTE — Progress Notes (Signed)
Physical Therapy Treatment Patient Details Name: James David MRN: [id number] DOB: Feb 14, 1936 Today's Date: 03/24/2017    History of Present Illness 81 y.o. male with medical history significant for diabetes mellitus 2 on oral agents, chronic kidney disease on dialysis, atrial fibrillation on chronic warfarin, anemia of chronic kidney disease, dyslipidemia. Recent admission for right subcortical stroke mid May 2018. Patient completed therapy in CIR and was discharged home at that time he was able to mobilize with the assistance of a rolling walker. Diagnosed  mid June with myasthenia gravis. Developed respiratory distress 7/3 and was intubated. Self extubated 7/5. CT - no sign of acute infarct.     PT Comments    Pt seen in conjunction with OT to provide skilled hands as he needs two person assist for most mobility.  We did find that the steady standing frame would be a good, safe device for staff to use for transfers as pt fatigues quickly and is quite a bit of assist on his feet (two person).  He works very hard and is making small gains with each session.  Goals may need to be downgraded when due as his progress is slower than anticipated.  He does remain appropriate for inpatient rehab.   Follow Up Recommendations  CIR     Equipment Recommendations  Wheelchair (measurements PT);Wheelchair cushion (measurements PT);Hospital bed    Recommendations for Other Services   NA     Precautions / Restrictions Precautions Precautions: Fall    Mobility  Bed Mobility Overal bed mobility: Needs Assistance     Sidelying to sit: Mod assist       General bed mobility comments: facilitation behind shoulder and to shift weight through UE to achieve upright sitting. VC and tactile cues to sustain B feet on floor.  Assist both at trunk to get upright and legs to progress over EOB.  Transfers Overall transfer level: Needs assistance Equipment used: 2 person hand held assist (steady) Transfers:  Sit to/from [facility] Sit to Stand: Mod assist;+2 physical assistance Stand pivot transfers: +2 physical assistance (steady standing frame used)       General transfer comment: Pt able to come up with assist at trunk over weak legs, but had a hard time maintaining extended hips and knees and upright trunk.  Steady used to increase safety during trasnfer to chair (fatigued after mutliple sit to stands for peri care) and to assess functionality of using this standing frame with RN staff.   Ambulation/Gait             General Gait Details: unable to safely ambulate at this time.           Balance Overall balance assessment: Needs assistance Sitting-balance support: Feet supported;Bilateral upper extremity supported Sitting balance-Leahy Scale: Poor Sitting balance - Comments: sat EOB x 15 minutes; mod support to prevent posterior LOB, frequent cues for midline posture and to look at mirror to keep balance EOB.  Postural control: Posterior lean;Left lateral lean Standing balance support: Bilateral upper extremity supported Standing balance-Leahy Scale: Poor Standing balance comment: needs two person external support to stand.                             Cognition Arousal/Alertness: Awake/alert Behavior During Therapy: WFL for tasks assessed/performed Overall Cognitive Status: Impaired/Different from baseline Area of Impairment: Attention;Memory;Safety/judgement  Current Attention Level: Sustained Memory: Decreased recall of precautions;Decreased short-term memory Following Commands: Follows one step commands consistently Safety/Judgement: Decreased awareness of safety;Decreased awareness of deficits Awareness: Emergent Problem Solving: Slow processing;Decreased initiation General Comments: Pt needs frequent cues to stay on task, especially related to posture.  Multimodal facilitation used.  When asked if he could get  himself over to the chair he said, "I don't know maybe I could" yet he was seated EOB having a difficult time holding himself up.              Pertinent Vitals/Pain Pain Assessment: No/denies pain           PT Goals (current goals can now be found in the care plan section) Acute Rehab PT Goals Patient Stated Goal: to get stronger Progress towards PT goals: Progressing toward goals    Frequency    Min 4X/week      PT Plan Current plan remains appropriate    Co-evaluation PT/OT/SLP Co-Evaluation/Treatment: Yes Reason for Co-Treatment: Complexity of the patient's impairments (multi-system involvement);For patient/therapist safety;To address functional/ADL transfers PT goals addressed during session: Mobility/safety with mobility;Balance;Strengthening/ROM        AM-PAC PT "6 Clicks" Daily Activity  Outcome Measure  Difficulty turning over in bed (including adjusting bedclothes, sheets and blankets)?: Total Difficulty moving from lying on back to sitting on the side of the bed? : Total Difficulty sitting down on and standing up from a chair with arms (e.g., wheelchair, bedside commode, etc,.)?: Total Help needed moving to and from a bed to chair (including a wheelchair)?: A Lot Help needed walking in hospital room?: Total Help needed climbing 3-5 steps with a railing? : Total 6 Click Score: 7    End of Session Equipment Utilized During Treatment: Gait belt Activity Tolerance: Patient limited by fatigue Patient left: in chair;with call bell/phone within reach Nurse Communication: Mobility status;Need for lift equipment PT Visit Diagnosis: Other abnormalities of gait and mobility (R26.89);Muscle weakness (generalized) (M62.81);Other symptoms and signs involving the nervous system (J82.505)     Time: 3976-7341 PT Time Calculation (min) (ACUTE ONLY): 35 min  Charges:  $Therapeutic Activity: 8-22 mins          Nelle Sayed B. [place], [place], DPT [phone]             03/24/2017, 6:01 PM

## 2017-03-24 NOTE — Progress Notes (Signed)
NIF -40 cmh2o VC 1.7 L/min.  Good effort

## 2017-03-25 LAB — CBC
HEMATOCRIT: 28.1 % — AB (ref 39.0–52.0)
Hemoglobin: 8.9 g/dL — ABNORMAL LOW (ref 13.0–17.0)
MCH: 30.2 pg (ref 26.0–34.0)
MCHC: 31.7 g/dL (ref 30.0–36.0)
MCV: 95.3 fL (ref 78.0–100.0)
Platelets: 238 10*3/uL (ref 150–400)
RBC: 2.95 MIL/uL — ABNORMAL LOW (ref 4.22–5.81)
RDW: 14.5 % (ref 11.5–15.5)
WBC: 14.3 10*3/uL — ABNORMAL HIGH (ref 4.0–10.5)

## 2017-03-25 LAB — GLUCOSE, CAPILLARY
GLUCOSE-CAPILLARY: 117 mg/dL — AB (ref 65–99)
GLUCOSE-CAPILLARY: 193 mg/dL — AB (ref 65–99)
GLUCOSE-CAPILLARY: 205 mg/dL — AB (ref 65–99)
Glucose-Capillary: 105 mg/dL — ABNORMAL HIGH (ref 65–99)
Glucose-Capillary: 252 mg/dL — ABNORMAL HIGH (ref 65–99)
Glucose-Capillary: 293 mg/dL — ABNORMAL HIGH (ref 65–99)

## 2017-03-25 LAB — RENAL FUNCTION PANEL
Albumin: 2.5 g/dL — ABNORMAL LOW (ref 3.5–5.0)
Anion gap: 9 (ref 5–15)
BUN: 50 mg/dL — AB (ref 6–20)
CHLORIDE: 97 mmol/L — AB (ref 101–111)
CO2: 29 mmol/L (ref 22–32)
Calcium: 8.6 mg/dL — ABNORMAL LOW (ref 8.9–10.3)
Creatinine, Ser: 4.55 mg/dL — ABNORMAL HIGH (ref 0.61–1.24)
GFR calc Af Amer: 13 mL/min — ABNORMAL LOW (ref >60)
GFR, EST NON AFRICAN AMERICAN: 11 mL/min — AB (ref >60)
Glucose, Bld: 107 mg/dL — ABNORMAL HIGH (ref 65–99)
POTASSIUM: 3.5 mmol/L (ref 3.5–5.1)
Phosphorus: 4.7 mg/dL — ABNORMAL HIGH (ref 2.5–4.6)
Sodium: 135 mmol/L (ref 135–145)

## 2017-03-25 LAB — PROTIME-INR
INR: 1.82
Prothrombin Time: 21.3 seconds — ABNORMAL HIGH (ref 11.4–15.2)

## 2017-03-25 MED ORDER — PREDNISONE 20 MG PO TABS: 20.0000 mg | ORAL_TABLET | Freq: Every day | ORAL | 1 refills | Status: DC

## 2017-03-25 MED ORDER — PRO-STAT SUGAR FREE PO LIQD: 30.0000 mL | Freq: Two times a day (BID) | ORAL | 0 refills | Status: DC

## 2017-03-25 MED ORDER — INSULIN ASPART 100 UNIT/ML ~~LOC~~ SOLN
0.0000 [IU] | Freq: Three times a day (TID) | SUBCUTANEOUS | Status: DC
  Administered 2017-03-25: 3 [IU] via SUBCUTANEOUS
  Administered 2017-03-25: 5 [IU] via SUBCUTANEOUS
  Administered 2017-03-25: 2 [IU] via SUBCUTANEOUS
  Administered 2017-03-26: 5 [IU] via SUBCUTANEOUS
  Administered 2017-03-26: 1 [IU] via SUBCUTANEOUS

## 2017-03-25 MED ORDER — CEFAZOLIN SODIUM-DEXTROSE 2-4 GM/100ML-% IV SOLN: 2.0000 g | INTRAVENOUS | 0 refills | Status: AC

## 2017-03-25 MED ORDER — TRAZODONE HCL 50 MG PO TABS: 50.0000 mg | ORAL_TABLET | Freq: Every evening | ORAL | Status: DC | PRN

## 2017-03-25 MED ORDER — FAMOTIDINE 40 MG/5ML PO SUSR: 20.0000 mg | Freq: Every day | ORAL | 0 refills | Status: DC

## 2017-03-25 MED ORDER — IPRATROPIUM-ALBUTEROL 0.5-2.5 (3) MG/3ML IN SOLN: 3.0000 mL | Freq: Four times a day (QID) | RESPIRATORY_TRACT | 0 refills | Status: DC | PRN

## 2017-03-25 MED ORDER — LORAZEPAM 1 MG PO TABS: 1.0000 mg | ORAL_TABLET | Freq: Four times a day (QID) | ORAL | 0 refills | Status: DC | PRN

## 2017-03-25 MED ORDER — WARFARIN SODIUM 5 MG PO TABS
2.5000 mg | ORAL_TABLET | Freq: Once | ORAL | Status: AC
  Administered 2017-03-25: 2.5 mg via ORAL
  Filled 2017-03-25: qty 1

## 2017-03-25 MED ORDER — NEPRO/CARBSTEADY PO LIQD: 237.0000 mL | Freq: Two times a day (BID) | ORAL | 0 refills | Status: DC

## 2017-03-25 MED ORDER — CEFAZOLIN SODIUM-DEXTROSE 2-4 GM/100ML-% IV SOLN
2.0000 g | INTRAVENOUS | Status: DC
  Administered 2017-03-26: 2 g via INTRAVENOUS
  Filled 2017-03-25: qty 100

## 2017-03-25 NOTE — NC FL2 (Signed)
[place] LEVEL OF CARE SCREENING TOOL     IDENTIFICATION  Patient Name: James David Birthdate: 1935-12-12 Sex: male Admission Date (Current Location): 03/17/2017  [facility] and [place] Number:  Herbalist and Address:  [facility] [place], [place] [address] [place], [place] [address]      Provider Number: [id number]  Attending Physician Name and Address:  Theodis Blaze, MD  Relative Name and Phone Number:       Current Level of Care: Hospital Recommended Level of Care: [place] Prior Approval Number:    Date Approved/Denied:   PASRR Number: [id number]  Discharge Plan: SNF    Current Diagnoses: Patient Active Problem List   Diagnosis Date Noted  . Myasthenia gravis ([place])   . Benign essential HTN   . Labile blood glucose   . Dysphagia   . Leukocytosis   . Pressure injury of skin 03/18/2017  . Acute respiratory failure with hypoxia ([place])   . Myasthenia gravis in crisis ([place]) 03/17/2017  . Diabetes mellitus without complication ([place]) 73/41/9379  . History of CVA in adulthood 03/17/2017  . Atrial fibrillation, permanent ([place]) 03/17/2017  . Supratherapeutic INR 03/17/2017  . HTN (hypertension) 03/17/2017  . Dyslipidemia 03/17/2017  . ESRD on dialysis ([place]) 03/17/2017  . Anemia 03/17/2017  . Low back pain 02/28/2017  . Neck pain 02/28/2017  . Polyneuropathy 02/28/2017  . Gait disturbance, post-stroke   . Subcortical infarction ([place]) 02/06/2017  . Intracranial vascular stenosis 02/04/2017  . Syncope   . Orthostatic hypotension   . Stroke ([place]) 02/01/2017  . TIA (transient ischemic attack) 01/29/2017  . Hypertension 01/29/2017  . CKD (chronic kidney disease) stage V requiring chronic dialysis ([place]) 01/29/2017  . DDD (degenerative disc disease) 01/29/2017  . HLD (hyperlipidemia) 01/29/2017  . Second degree heart block 01/29/2017  . Atrial flutter ([place]) 01/29/2017  . ESRD (end stage renal disease) on  dialysis ([place])   . Melanoma of skin ([place]) 03/13/2016  . Atrial flutter ([place]) 06/26/2015  . Pacemaker 10/13/2014  . End stage renal disease ([place]) 07/15/2014  . Chronic diastolic heart failure ([place]) 03/24/2014  . Second-degree heart block 12/13/2013  . Chronic kidney disease, stage IV (severe) ([place]) 12/13/2013  . Essential hypertension 12/13/2013  . Diabetes mellitus due to underlying condition with diabetic nephropathy ([place]) 12/13/2013  . Hyperlipidemia 12/13/2013    Orientation RESPIRATION BLADDER Height & Weight     Self, Time, Situation, Place  Normal Incontinent, Indwelling catheter Weight: 171 lb 15.3 oz (78 kg) Height:  5' 10" (177.8 cm)  BEHAVIORAL SYMPTOMS/MOOD NEUROLOGICAL BOWEL NUTRITION STATUS      Incontinent Diet (dysphagia 2)  AMBULATORY STATUS COMMUNICATION OF NEEDS Skin   Extensive Assist Verbally PU Stage and Appropriate Care, Skin abrasions   PU Stage 2 Dressing:  (every 5 days)                   Personal Care Assistance Level of Assistance  Bathing, Dressing Bathing Assistance: Maximum assistance   Dressing Assistance: Maximum assistance     Functional Limitations Info             SPECIAL CARE FACTORS FREQUENCY  PT (By licensed PT), OT (By licensed OT)     PT Frequency: 5x/wk OT Frequency: 5x/wk            Contractures      Additional Factors Info  Code Status, Allergies, Insulin Sliding Scale Code Status Info: full Allergies Info: Amlodipine Besylate,  Lisinopril, Statins, Ropinirole, Tape   Insulin Sliding Scale Info: 3x/day       Current Medications (03/25/2017):  This is the current hospital active medication list Current Facility-Administered Medications  Medication Dose Route Frequency Provider Last Rate Last Dose  . 0.9 %  sodium chloride infusion   Intravenous Once Dunham, Cynthia, MD      . acetaminophen (TYLENOL) tablet 650 mg  650 mg Oral Q6H PRN Ellis, Allison L, NP   650 mg at 03/24/17 1517   Or  . acetaminophen  (TYLENOL) suppository 650 mg  650 mg Rectal Q6H PRN Ellis, Allison L, NP   650 mg at 03/18/17 1154  . aspirin tablet 325 mg  325 mg Oral Daily Ramaswamy, Murali, MD   325 mg at 03/25/17 1008  . carvedilol (COREG) tablet 3.125 mg  3.125 mg Oral BID WC Mannam, Praveen, MD   3.125 mg at 03/25/17 1008  . cefTRIAXone (ROCEPHIN) 2 g in dextrose 5 % 50 mL IVPB  2 g Intravenous Q24H Ramaswamy, Murali, MD 100 mL/hr at 03/25/17 1008 2 g at 03/25/17 1008  . chlorhexidine gluconate (MEDLINE KIT) (PERIDEX) 0.12 % solution 15 mL  15 mL Mouth Rinse BID Hammonds, Kathleen H, MD   15 mL at 03/24/17 0826  . Chlorhexidine Gluconate Cloth 2 % PADS 6 each  6 each Topical Daily Hammonds, Kathleen H, MD   6 each at 03/23/17 1058  . [START ON 03/26/2017] Darbepoetin Alfa (ARANESP) injection 100 mcg  100 mcg Intravenous Q Wed-HD Bergman, Martha, PA-C      . doxercalciferol (HECTOROL) injection 4 mcg  4 mcg Intravenous Q M,W,F-HD Bergman, Martha, PA-C   4 mcg at 03/24/17 1518  . famotidine (PEPCID) 40 MG/5ML suspension 20 mg  20 mg Oral QHS Ramaswamy, Murali, MD   20 mg at 03/23/17 2118  . feeding supplement (NEPRO CARB STEADY) liquid 237 mL  237 mL Oral BID BM Dunham, Cynthia, MD 0 mL/hr at 03/23/17 1901 237 mL at 03/25/17 1000  . feeding supplement (PRO-STAT SUGAR FREE 64) liquid 30 mL  30 mL Oral BID Dunham, Cynthia, MD   30 mL at 03/25/17 1008  . gabapentin (NEURONTIN) capsule 100 mg  100 mg Oral TID Kirkpatrick, McNeill P, MD   100 mg at 03/25/17 1008  . insulin aspart (novoLOG) injection 0-9 Units  0-9 Units Subcutaneous TID AC & HS Myers, Iskra M, MD      . ipratropium-albuterol (DUONEB) 0.5-2.5 (3) MG/3ML nebulizer solution 3 mL  3 mL Nebulization Q6H PRN Mannam, Praveen, MD      . LORazepam (ATIVAN) tablet 1 mg  1 mg Oral Q6H PRN Myers, Iskra M, MD   1 mg at 03/24/17 2024  . MEDLINE mouth rinse  15 mL Mouth Rinse QID Hammonds, Kathleen H, MD   15 mL at 03/23/17 1724  . multivitamin (RENA-VIT) tablet 1 tablet  1 tablet  Oral QHS Dunham, Cynthia, MD   1 tablet at 03/24/17 2024  . ondansetron (ZOFRAN) injection 4 mg  4 mg Intravenous Q6H PRN Yacoub, Wesam G, MD   4 mg at 03/18/17 1130  . ondansetron (ZOFRAN) tablet 4 mg  4 mg Oral Q6H PRN Ellis, Allison L, NP      . predniSONE (DELTASONE) tablet 20 mg  20 mg Oral Q breakfast Myers, Iskra M, MD   20 mg at 03/25/17 1008  . sodium chloride flush (NS) 0.9 % injection 10-40 mL  10-40 mL Intracatheter Q12H Hammonds, Kathleen H, MD     20 mL at 03/24/17 2200  . sodium chloride flush (NS) 0.9 % injection 10-40 mL  10-40 mL Intracatheter PRN Hammonds, Kathleen H, MD   20 mL at 03/25/17 0842  . traZODone (DESYREL) tablet 50 mg  50 mg Oral QHS PRN Myers, Iskra M, MD   50 mg at 03/24/17 2024  . warfarin (COUMADIN) tablet 1 mg  1 mg Oral ONCE-1800 Egan, Theresa D, RPH      . Warfarin - Pharmacist Dosing Inpatient   Does not apply q1800 Ramaswamy, Murali, MD         Discharge Medications: Please see discharge summary for a list of discharge medications.  Relevant Imaging Results:  Relevant Lab Results:   Additional Information SS#: [id number]   M , LCSW    

## 2017-03-25 NOTE — Progress Notes (Signed)
Rehab admissions - Please see rehab consult done by Dr. Posey Pronto on 03/24/17.  Noted wife prefers rehab closer to her home.  Social worker is on the case.  I will not pursue inpatient rehab admission here at [facility] since wife wants closer to home.  Call me for questions.  #[phone]

## 2017-03-25 NOTE — Progress Notes (Signed)
VC 1.8, NIF -60 with good effort.  No complications noted. VS within normal limits.  RT will continue to monitor.

## 2017-03-25 NOTE — Progress Notes (Signed)
Pharmacy Antibiotic Note  James David is a 81 y.o. male admitted on 03/17/2017 with myasthenia gravis exacerbation and was found to have subsequent E.coli urosepsis. Pt is ESRD. Pharmacy has been consulted to transition from Rocephin to Cefazolin with HD for a total LOT of 14d.   The patient's HD schedule is MWF - currently on schedule. The patient has already received Rocephin today. Will plan to transition to Cefazolin tomorrow.   Plan: 1. Start Cefazolin 2g IV on MWF at 1800 starting 7/11 2. Will add stop date for 7/16 to complete a total LOT of 14 days 3. Will continue to follow HD schedule/duration, culture results, LOT, and antibiotic de-escalation plans   Height: 5\' 10"  (177.8 cm) Weight: 171 lb 15.3 oz (78 kg) IBW/kg (Calculated) : 73  Temp (24hrs), Avg:98.5 F (36.9 C), Min:98 F (36.7 C), Max:98.8 F (37.1 C)   Recent Labs Lab 03/18/17 1618  03/21/17 0545 03/22/17 0533 03/23/17 0404 03/23/17 1025 03/24/17 0424 03/25/17 0447  WBC  --   < > 14.5* 13.3* 14.8*  --  15.9* 14.3*  CREATININE  --   < > 6.28* 3.99*  --  6.29* 7.34* 4.55*  LATICACIDVEN 0.6  --   --   --   --   --   --   --   < > = values in this interval not displayed.  Estimated Creatinine Clearance: 13.8 mL/min (A) (by C-G formula based on SCr of 4.55 mg/dL (H)).    Allergies  Allergen Reactions  . Amlodipine Besylate Swelling    Angioedema   . Lisinopril Other (See Comments)    Renal insufficiency  . Statins Other (See Comments)    Muscle aches  . Ropinirole Other (See Comments)    Causes excessive grogginess the following morning after taking  . Tape Other (See Comments)    SKIN IS THIN AND TAPE WILL ACTUALLY TEAR IT; PLEASE USE COBAN WRAP INSTEAD!!    Antimicrobials this admission: Vanc 7/3 x 1 Zosyn 7/3 >> 7/6 Ceftriaxone 7/6 >> 7/10 Cefazolin 7/10 >>  Dose adjustments this admission: n/a  Microbiology results: 7/3 BCx >> E.coli  7/3 UCx >> 100k E.coli   Thank you for allowing  pharmacy to be a part of this patient's care.  Alycia Rossetti, PharmD, BCPS Clinical Pharmacist Clinical phone for 03/25/2017 from 7a-3:30p: [phone] If after 3:30p, please call main pharmacy at: [phone] 03/25/2017 3:27 PM

## 2017-03-25 NOTE — Progress Notes (Signed)
   03/25/17 2003  Daily Weaning Assessment  VC Weaning 2.7 L/min  NIF -60 cmH2O

## 2017-03-25 NOTE — Progress Notes (Signed)
  Speech Language Pathology Treatment: Dysphagia;Cognitive-Linquistic  Patient Details Name: James David MRN: [id number] DOB: 12/24/1935 Today's Date: 03/25/2017 Time: 1478-2956 SLP Time Calculation (min) (ACUTE ONLY): 17 min  Assessment / Plan / Recommendation Clinical Impression  Skilled treatment with moderate verbal/visual cues provided to complete compensatory strategies consistently during intake of Dysphagia 2/thin liquids with one incidence of immediate cough noted when pt had a visitor enter the room and attempted to speak after a larger sip of thin liquids, but otherwise, pt had no difficulty with intake of thin liquids during treatment session utilizing compensatory strategies; pt with slow but thorough mastication with Dysphagia 2 with prolonged oral preparation noted; pt completed simple functional problem solving tasks/awareness during session related to swallowing/discussing discharge to potential SNF with minimal verbal cueing required and 75% accuracy; probable overall oral weakness d/t Myasthenia Gravis with intake of Dysphagia 2/thin; recommend continuing current diet for safety during intake/education with family/pt for aspiration precautions/compensatory strategies for cognition/swallowing.  HPI HPI: 81 y.o. male with medical history significant for diabetes mellitus 2 on oral agents, chronic kidney disease on dialysis, atrial fibrillation on chronic warfarin, anemia of chronic kidney disease, dyslipidemia. Recent admission for right subcortical stroke mid May 2018. Patient completed therapy in CIR and was discharged home at that time he was able to mobilize with the assistance of a rolling walker. He has also followed up with neurology since that admission and due to persistent weakness not explained by patient's recent stroke other neurological causes were investigated. He has subsequently been diagnosed as a mid June with myasthenia gravis. He initially started taking low-dose  Mestinon 15 mg 3 times a day but had severe nonbloody diarrhea and therefore dosage was decreased to twice a day      SLP Plan  Continue with current plan of care       Recommendations  Diet recommendations: Dysphagia 2 (fine chop);Thin liquid Liquids provided via: Cup;No straw Medication Administration: Whole meds with puree Supervision: Staff to assist with self feeding;Full supervision/cueing for compensatory strategies Compensations: Minimize environmental distractions;Slow rate;Small sips/bites;Multiple dry swallows after each bite/sip Postural Changes and/or Swallow Maneuvers: Seated upright 90 degrees                Oral Care Recommendations: Oral care BID Follow up Recommendations: Skilled Nursing facility SLP Visit Diagnosis: Dysphagia, oropharyngeal phase (R13.12);Cognitive communication deficit (O13.086) Plan: Continue with current plan of care                       Elvina Sidle, M.S., CCC-SLP 03/25/2017, 3:06 PM

## 2017-03-25 NOTE — Discharge Summary (Addendum)
Physician Discharge Summary  James David WUJ:[id number] DOB: 12-13-1935 DOA: 03/17/2017  PCP: [name], MD  Admit date: 03/17/2017 Discharge date: 03/25/2017  Recommendations for Outpatient Follow-up:  1. Pt will need to follow up with PCP in 1 week post discharge  2. Check Hg level 3. Please nota that pt is to continue treatment with antibiotic Cefazolin, which is to be given with HD sessions for three more doses (this will complete recommended 14 days therapy for E. Coli urosepsis and bacteremia) 4. Please also note that neurologist has recommended slow prednisone taper starting wtth 20 mg daily and reevaluating clinical status in one week to determine next appropriate taper plan. If patient clinically improving, can lowe the dose of Prednisone to 10 mg PO daily for 7 more days but if clinical status unchanged or worse, recommend continuing Prednisone 20 mg PO QD for 7 more days prior to taper.  5. Please also schedule appointment with pt's neurologist in 1-2 weeks  6. Follow up on PT/INR and adjust the dose of Coumadin as indicated  7. Pt to continue with HD MWF, needs transport with HD  8. Requip has been stopped until pt clinically improves and can be resumed once thought it is clinically necessary   Discharge Diagnoses:  Principal Problem:   Myasthenia gravis in crisis [facility]) Active Problems:   Diabetes mellitus without complication ([place])   History of CVA in adulthood   Atrial fibrillation, permanent ([place])  Discharge Condition: Stable  Diet recommendation: renal diet, dys II diet  Brief Narrative:  81 y.o.malewith a PMH as outlined below including but not limited to recent diagnosis of probable Myasthenia Gravis (was seen as outpatient and had positive acetylcholine receptor antibodies). He was started on mestinon but did not tolerate it due to severe diarrhea. He has had progressive weakness for the past month or two to the point where he has had difficulty getting out  of bed and has had difficulty swallowing. He has not had any diplopia or SOB at home.  He was admitted 7/2 with generalized weakness. He was evaluated by neurology who felt that he likely did have Myasthenia and recommended treatment with IVIG for 5 daily doses (holding steroids for a few days).  On 7/3, he had hypoxia with mild increased WOB; therefore, PCCM was asked to see. He was placed on NRB which he feels is helping. He has strong cough and is able to control airway well. He denies sensation of respiratory fatigue. He is awaiting his scheduled hemodialysis (he did receive 80mg  lasix prior to [place] seeing him. [place] urine documented as - 61).  SIGNIFICANT EVENTS  7/2 > admit. 7/3 >PCCM consult. Has mild dyspnea but does not feel tired. Able to follow commands. Good cough, controlling airway well. 7/7 > TRH asked to assume care  Assessment & Plan: Acute respiratory failure d/t/ MG crisis/sepsis/volume  - respiratory status stable, oxygen saturation at target range  - no dyspnea this AM - pt reports feeling better this AM  Subjective progressive generalized weakness  - in the setting of recent diagnosis of myasthenia gravis - with some fecal and urinary incontinence  - per neurology, pt's hyperreflexia possibly from previous cervical injury with possible new current myelopathy and likely exacerbated by sepsis  - per neurology, ? Small stroke on MRI likely artifact, would not anticoagulate   E. Coli urosepsis  - on Rocephin, WBC is trending down 15 --> 14 --> 13 --> 14 --> 15 --> 14 K - no  fevers - repeat CBC in AM - repeat blood culture from 7/9 with no growth to date  - transition to Cefazolin in AM to complete for three more doses with HD sessions   ESRD on HD - on schedule MWF - per nephrology   Iron deficiency anemia - Hg up from 7.3 --> 8.6 --> 8.7 --> 8.5 --. 8.9 - no signs of active bleeding  - CBC in AM  Myasthenia gravis  - IVIG completed per neuro   - Prednisone taper slow as outlined above  - PT eval done, plan for SNF   DVT prophylaxis: Coumadin  Code Status: Full  Family Communication: Patient and wife at bedside  Disposition Plan: SNF in AM if blood work stable   Consultants:   Neurology  Nephrology   Antimicrobials:   Rocephin, change to Cefazolin in AM 7/11 and stop date 7/16  Procedures/Studies: Ct Angio Head W Or Wo Contrast  Result Date: 03/19/2017 CLINICAL DATA:  Gaze palsy. Right-sided weakness beginning yesterday. EXAM: CT ANGIOGRAPHY HEAD AND NECK CT PERFUSION BRAIN TECHNIQUE: Multidetector CT imaging of the head and neck was performed using the standard protocol during bolus administration of intravenous contrast. Multiplanar CT image reconstructions and MIPs were obtained to evaluate the vascular anatomy. Carotid stenosis measurements (when applicable) are obtained utilizing NASCET criteria, using the distal internal carotid diameter as the denominator. Multiphase CT imaging of the brain was performed following IV bolus contrast injection. Subsequent parametric perfusion maps were calculated using RAPID software. CONTRAST:  90 cc Isovue 370 COMPARISON:  CT earlier same day.  CT angiography 02/04/2017. FINDINGS: CTA NECK FINDINGS Aortic arch: Aortic atherosclerosis. No dissection. Branching pattern of the brachiocephalic vessels from the arch is normal without origin stenosis. Right carotid system: Common carotid artery widely patent to the bifurcation region. Atherosclerotic calcification of the carotid bifurcation but no stenosis. Cervical ICA widely patent to the skullbase. Left carotid system: Common carotid artery widely patent to the bifurcation. Atherosclerotic calcification of the carotid bifurcation. 20% stenosis of the proximal ICA. Wide patency beyond that. Vertebral arteries: There is atherosclerotic disease of both vertebral artery origins with stenosis estimated at 50% on the left and 70% on the right. Both  vessels do show flow through the cervical region to the foramen magnum. Skeleton: Previous ACDF C5-6. Mild spondylosis above and below that. 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 Thyroid nodules unchanged since previous. Upper chest: Negative Review of the MIP images confirms the above findings CTA HEAD FINDINGS Anterior circulation: Both internal carotid arteries are patent through the skullbase and siphon regions. Atherosclerotic calcification in the siphon regions but no stenosis greater than 30%. Supraclinoid internal carotid arteries are stenotic, 50-70% on the right and 50% on the left. Anterior and middle cerebral vessels are patent without correctable proximal stenosis. Moderate narrowing A1 segment on the right. No large vessel occlusion. Posterior circulation: Both vertebral arteries are patent at the foramen magnum. The right vertebral artery is occluded or severely stenotic past PICA. Left vertebral artery is patent to the basilar. Basilar artery shows mild narrowing and irregularity but no focal or correctable stenosis. Posterior circulation branch vessels are patent. Left PCA arises from the anterior circulation. Venous sinuses: Patent and normal Anatomic variants: None significant Delayed phase: No abnormal enhancement. There is low-density in the midline at the pontomedullary junction that I think is a real finding an was present on the immediate prior exam. This probably represents a brainstem infarction. Review of the  MIP images confirms the above findings CT Brain Perfusion Findings: CBF (<30%) Volume: 49mL Perfusion (Tmax>6.0s) volume: 65mL Mismatch Volume: 52mL Infarction Location:None identified. IMPRESSION: I think there is an acute/ subacute infarction at the pontomedullary junction in the midline. This is below the region accurately evaluated by the perfusion exam. No medium or large vessel occlusion.  CT perfusion is negative. CT angiography does  not appear different than the study of 02/04/2017. Atherosclerotic change of both carotid bifurcations but without flow limiting stenosis. Atherosclerotic change in both carotid siphon regions and supraclinoid internal carotid arteries. Supraclinoid ICA stenosis measures 50-70% on the right and approximately 50% on the left. Bilateral vertebral artery origin stenoses estimated at 50% on the left and 70% on the right. Severe stenosis or occlusion of the right vertebral artery distal to PICA. Left vertebral artery sufficiently patent to the basilar. Basilar artery shows some narrowing and atherosclerotic irregularity. These results were called by telephone at the time of interpretation on 03/19/2017 at 1450 pm to Dr. Roland Rack , who verbally acknowledged these results. Electronically Signed   By: Nelson Chimes M.D.   On: 03/19/2017 15:24   Dg Chest 2 View  Result Date: 03/17/2017 CLINICAL DATA:  Generalize weakness for the past 3 days. History of atrial fibrillation, diabetes, end-stage renal disease. EXAM: CHEST  2 VIEW COMPARISON:  Chest x-ray of February 19, 2017 FINDINGS: The lungs are adequately inflated and clear. The cardiac silhouette is mildly enlarged. The pulmonary vascularity is normal. The mediastinum is normal in width. The ICD is in stable position. The trachea is midline. The bony thorax exhibits no acute abnormality. There surgical clips in the left axillary region. IMPRESSION: There is no pneumonia, CHF, nor other acute cardiopulmonary abnormality. Electronically Signed   By: David  Martinique M.D.   On: 03/17/2017 13:41   Ct Head Wo Contrast  Result Date: 03/19/2017 CLINICAL DATA:  New onset of left-sided neglect in weakness since yesterday. EXAM: CT HEAD WITHOUT CONTRAST TECHNIQUE: Contiguous axial images were obtained from the base of the skull through the vertex without intravenous contrast. COMPARISON:  02/16/2017 FINDINGS: Brain: Mild generalized atrophy. Moderate chronic small-vessel  ischemic changes of the cerebral hemispheric white matter. Old left caudate infarction. No sign of acute infarction, mass lesion, hemorrhage, hydrocephalus or extra-axial collection. Vascular: There is atherosclerotic calcification of the major vessels at the base of the brain. Skull: Negative Sinuses/Orbits: Clear/normal Other: None significant IMPRESSION: No acute finding by CT. Atrophy and chronic small-vessel ischemic changes. Electronically Signed   By: Nelson Chimes M.D.   On: 03/19/2017 12:49   Ct Angio Neck W Or Wo Contrast  Result Date: 03/19/2017 CLINICAL DATA:  Gaze palsy. Right-sided weakness beginning yesterday. EXAM: CT ANGIOGRAPHY HEAD AND NECK CT PERFUSION BRAIN TECHNIQUE: Multidetector CT imaging of the head and neck was performed using the standard protocol during bolus administration of intravenous contrast. Multiplanar CT image reconstructions and MIPs were obtained to evaluate the vascular anatomy. Carotid stenosis measurements (when applicable) are obtained utilizing NASCET criteria, using the distal internal carotid diameter as the denominator. Multiphase CT imaging of the brain was performed following IV bolus contrast injection. Subsequent parametric perfusion maps were calculated using RAPID software. CONTRAST:  90 cc Isovue 370 COMPARISON:  CT earlier same day.  CT angiography 02/04/2017. FINDINGS: CTA NECK FINDINGS Aortic arch: Aortic atherosclerosis. No dissection. Branching pattern of the brachiocephalic vessels from the arch is normal without origin stenosis. Right carotid system: Common carotid artery widely patent to the bifurcation region. Atherosclerotic calcification  of the carotid bifurcation but no stenosis. Cervical ICA widely patent to the skullbase. Left carotid system: Common carotid artery widely patent to the bifurcation. Atherosclerotic calcification of the carotid bifurcation. 20% stenosis of the proximal ICA. Wide patency beyond that. Vertebral arteries: There is  atherosclerotic disease of both vertebral artery origins with stenosis estimated at 50% on the left and 70% on the right. Both vessels do show flow through the cervical region to the foramen magnum. Skeleton: Previous ACDF C5-6. Mild spondylosis above and below that. 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 Thyroid nodules unchanged since previous. Upper chest: Negative Review of the MIP images confirms the above findings CTA HEAD FINDINGS Anterior circulation: Both internal carotid arteries are patent through the skullbase and siphon regions. Atherosclerotic calcification in the siphon regions but no stenosis greater than 30%. Supraclinoid internal carotid arteries are stenotic, 50-70% on the right and 50% on the left. Anterior and middle cerebral vessels are patent without correctable proximal stenosis. Moderate narrowing A1 segment on the right. No large vessel occlusion. Posterior circulation: Both vertebral arteries are patent at the foramen magnum. The right vertebral artery is occluded or severely stenotic past PICA. Left vertebral artery is patent to the basilar. Basilar artery shows mild narrowing and irregularity but no focal or correctable stenosis. Posterior circulation branch vessels are patent. Left PCA arises from the anterior circulation. Venous sinuses: Patent and normal Anatomic variants: None significant Delayed phase: No abnormal enhancement. There is low-density in the midline at the pontomedullary junction that I think is a real finding an was present on the immediate prior exam. This probably represents a brainstem infarction. Review of the MIP images confirms the above findings CT Brain Perfusion Findings: CBF (<30%) Volume: 32mL Perfusion (Tmax>6.0s) volume: 33mL Mismatch Volume: 107mL Infarction Location:None identified. IMPRESSION: I think there is an acute/ subacute infarction at the pontomedullary junction in the midline. This is below the region  accurately evaluated by the perfusion exam. No medium or large vessel occlusion.  CT perfusion is negative. CT angiography does not appear different than the study of 02/04/2017. Atherosclerotic change of both carotid bifurcations but without flow limiting stenosis. Atherosclerotic change in both carotid siphon regions and supraclinoid internal carotid arteries. Supraclinoid ICA stenosis measures 50-70% on the right and approximately 50% on the left. Bilateral vertebral artery origin stenoses estimated at 50% on the left and 70% on the right. Severe stenosis or occlusion of the right vertebral artery distal to PICA. Left vertebral artery sufficiently patent to the basilar. Basilar artery shows some narrowing and atherosclerotic irregularity. These results were called by telephone at the time of interpretation on 03/19/2017 at 1450 pm to Dr. Roland Rack , who verbally acknowledged these results. Electronically Signed   By: Nelson Chimes M.D.   On: 03/19/2017 15:24   Ct Cervical Spine Wo Contrast  Result Date: 03/03/2017 CLINICAL DATA:  81 year old male with gait disturbance. History of stroke. Patient presenting with back pain. EXAM: CT CERVICAL SPINE WITHOUT CONTRAST TECHNIQUE: Multidetector CT imaging of the cervical spine was performed without intravenous contrast. Multiplanar CT image reconstructions were also generated. COMPARISON:  Cervical spine CT dated 02/04/2017 FINDINGS: Alignment: No acute subluxation. Skull base and vertebrae: No acute fracture. The bones are osteopenic. C5-C6 disc spacer and anterior fusion plate and screws noted. Soft tissues and spinal canal: No prevertebral fluid or swelling. No visible canal hematoma. Disc levels: There is multilevel degenerative changes with endplate irregularity. Multilevel anterior osteophyte noted most prominent  at C4-C5 and C6-C7. There is mild neural foramina narrowing at C6-C7. Upper chest: Negative. Other: There is a 1.8 x 2.3 cm right thyroid  hypodense nodule. Further evaluation with ultrasound recommended. Bilateral calcified carotid bulb plaques noted. IMPRESSION: 1. No acute cervical spine pathology.  C5-C6 anterior fusion. 2. Degenerative changes and mild narrowing of the neural foramina at C6-C7. 3. Bilateral carotid bulb calcified plaque. 4. Right thyroid hypodense nodule. Ultrasound may provide better evaluation. Electronically Signed   By: Anner Crete M.D.   On: 03/03/2017 22:42   Ct Cervical Spine W Contrast  Result Date: 03/18/2017 CLINICAL DATA:  81 year old male dialysis patient with worsening generalized weakness. Recently diagnosed with myasthenia gravis. Hyper reflexia. Fever. EXAM: CT CERVICAL SPINE WITH CONTRAST CT THORACIC SPINE WITH CONTRAST TECHNIQUE: Multidetector CT imaging of the cervical and thoracic spine was performed with intravenous contrast. Multiplanar CT image reconstructions were also generated. CONTRAST:  118mL ISOVUE-300 IOPAMIDOL (ISOVUE-300) INJECTION 61% COMPARISON:  Lumbar spine CT 03/03/2017. CT cervical spine 03/03/2017, and earlier. FINDINGS: CT CERVICAL SPINE FINDINGS Alignment: Stable cervical lordosis. Cervicothoracic junction alignment is within normal limits. Bilateral posterior element alignment is within normal limits. Skull base and vertebrae: Visualized skull base is intact. No atlanto-occipital dissociation. No acute osseous abnormality identified. Prior C5-C6 ACDF. Soft tissues and spinal canal: Negative visualized posterior fossa. Major vascular structures in the neck and at the skullbase appear patent. Neck soft tissues are stable compared to the CTA head and neck 02/04/2017, including right tracheoesophageal groove soft tissue nodule. Disc levels: C2-C3: Mild to moderate facet hypertrophy greater on the right. Borderline to mild spinal stenosis. Mild right C3 foraminal stenosis. C3-C4: Mild to moderate facet hypertrophy greater on the right. Ligament flavum hypertrophy suspected (series 11,  image 39). Up to mild spinal stenosis. Borderline to mild right C4 foraminal stenosis. C4-C5: Disc space loss but mostly anterior endplate spurring. Mild facet hypertrophy. Ligament flavum hypertrophy suspected. No definite spinal stenosis. Borderline to mild left C5 foraminal stenosis. C5-C6:  Prior ACDF.  Solid arthrodesis.  No hardware loosening. C6-C7: Circumferential disc osteophyte complex with broad-based posterior component. Suspect mild to moderate spinal stenosis. Moderate to severe bilateral C7 foraminal stenosis in part due to endplate spurring. C7-T1: Right eccentric disc osteophyte complex. Mild to moderate facet hypertrophy greater on the left. Endplate spurring greater on the right. No definite spinal stenosis. Mild right C8 foraminal stenosis. CT THORACIC SPINE FINDINGS Segmentation:  Normal. Alignment: Exaggerated upper thoracic kyphosis.  No scoliosis. Vertebrae: Flowing osteophytes throughout the thoracic spine from T2 to the thoracolumbar junction. There is superimposed posterior element ankylosis beginning at T6-T7, and a extending to T10-T11. No acute osseous abnormality identified. Paraspinal and other soft tissues: Left chest cardiac pacemaker. Major airways are patent. Small layering left pleural effusion. Dependent lower lobe opacity most resembles atelectasis. Mild thyroid goiter. No mediastinal lymphadenopathy. Calcified aortic atherosclerosis. Partial atrophy of the visible kidneys greater on the right. Negative visualized posterior paraspinal soft tissues. Disc levels: T1-T2: Partially calcified central disc protrusion. Mild to moderate spinal stenosis suspected (series 17, image 42). T2-T3: Ankylosis begins at this level via anterior endplate osteophytes. T3-T4: Ankylosis via endplate osteophytes. Mild endplate spurring. No definite spinal stenosis. T4-T5: Ankylosis via endplate osteophytes. Superimposed small calcified disc bulging. Borderline to mild spinal stenosis (series 17,  image 37). T5-T6: Ankylosis via anterior endplate osteophytes.  No stenosis. T6-T7: Anterior and posterior element ankylosis. No spinal stenosis. Facet hypertrophy such that there is some osseous foraminal stenosis greater on the left. T7-T8: Ankylosis.  Mild epidural lipomatosis. Borderline to mild spinal stenosis (series 17, image 34). T8-T9: Ankylosis with mild to moderate facet and ligament flavum hypertrophy with calcified ligament flavum. Mild endplate spurring. Mild to moderate spinal stenosis suspected (series 12, image 103 and series 17, image 34). T9-T10: Ankylosis. Mild to moderate posterior element hypertrophy. Mostly far lateral endplate spurring. Borderline to mild spinal stenosis. T10-T11: Ankylosis. Severe facet hypertrophy greater on the right. No definite spinal stenosis but severe bilateral T10 foraminal stenosis. T11-T12: Interbody ankylosis.  No stenosis. T12-L1:  Interbody ankylosis.  No spinal stenosis. IMPRESSION: 1. Prior cervical ACDF at C5-C6 with solid arthrodesis. 2. Adjacent segment disease at C6-C7 with mild to moderate suspected degenerative cervical spinal stenosis. 3. Mild if any additional cervical spinal stenosis elsewhere, such as C2-C3. 4. Widespread thoracic spine ankylosis from T2 inferiorly. This is primarily due to flowing endplate osteophytes, but there is superimposed posterior element ankylosis from T6-T7 to T10-T11. 5. Mild to moderate degenerative thoracic spinal stenosis suspected at T1-T2 and T8-T9. 6. Mild if any additional thoracic spinal stenosis, such as at T4-T5 and T9-T10. 7. Severe thoracic facet degeneration at T10-T11 with severe T10 neural foraminal stenosis. 8.  No acute osseous abnormality identified. 9. Small layering left pleural effusion. Bilateral lower lobe atelectasis. Electronically Signed   By: Genevie Ann M.D.   On: 03/18/2017 20:03   Ct Thoracic Spine W Contrast  Result Date: 03/18/2017 CLINICAL DATA:  81 year old male dialysis patient with  worsening generalized weakness. Recently diagnosed with myasthenia gravis. Hyper reflexia. Fever. EXAM: CT CERVICAL SPINE WITH CONTRAST CT THORACIC SPINE WITH CONTRAST TECHNIQUE: Multidetector CT imaging of the cervical and thoracic spine was performed with intravenous contrast. Multiplanar CT image reconstructions were also generated. CONTRAST:  163mL ISOVUE-300 IOPAMIDOL (ISOVUE-300) INJECTION 61% COMPARISON:  Lumbar spine CT 03/03/2017. CT cervical spine 03/03/2017, and earlier. FINDINGS: CT CERVICAL SPINE FINDINGS Alignment: Stable cervical lordosis. Cervicothoracic junction alignment is within normal limits. Bilateral posterior element alignment is within normal limits. Skull base and vertebrae: Visualized skull base is intact. No atlanto-occipital dissociation. No acute osseous abnormality identified. Prior C5-C6 ACDF. Soft tissues and spinal canal: Negative visualized posterior fossa. Major vascular structures in the neck and at the skullbase appear patent. Neck soft tissues are stable compared to the CTA head and neck 02/04/2017, including right tracheoesophageal groove soft tissue nodule. Disc levels: C2-C3: Mild to moderate facet hypertrophy greater on the right. Borderline to mild spinal stenosis. Mild right C3 foraminal stenosis. C3-C4: Mild to moderate facet hypertrophy greater on the right. Ligament flavum hypertrophy suspected (series 11, image 39). Up to mild spinal stenosis. Borderline to mild right C4 foraminal stenosis. C4-C5: Disc space loss but mostly anterior endplate spurring. Mild facet hypertrophy. Ligament flavum hypertrophy suspected. No definite spinal stenosis. Borderline to mild left C5 foraminal stenosis. C5-C6:  Prior ACDF.  Solid arthrodesis.  No hardware loosening. C6-C7: Circumferential disc osteophyte complex with broad-based posterior component. Suspect mild to moderate spinal stenosis. Moderate to severe bilateral C7 foraminal stenosis in part due to endplate spurring. C7-T1:  Right eccentric disc osteophyte complex. Mild to moderate facet hypertrophy greater on the left. Endplate spurring greater on the right. No definite spinal stenosis. Mild right C8 foraminal stenosis. CT THORACIC SPINE FINDINGS Segmentation:  Normal. Alignment: Exaggerated upper thoracic kyphosis.  No scoliosis. Vertebrae: Flowing osteophytes throughout the thoracic spine from T2 to the thoracolumbar junction. There is superimposed posterior element ankylosis beginning at T6-T7, and a extending to T10-T11. No acute osseous abnormality identified. Paraspinal and other soft  tissues: Left chest cardiac pacemaker. Major airways are patent. Small layering left pleural effusion. Dependent lower lobe opacity most resembles atelectasis. Mild thyroid goiter. No mediastinal lymphadenopathy. Calcified aortic atherosclerosis. Partial atrophy of the visible kidneys greater on the right. Negative visualized posterior paraspinal soft tissues. Disc levels: T1-T2: Partially calcified central disc protrusion. Mild to moderate spinal stenosis suspected (series 17, image 42). T2-T3: Ankylosis begins at this level via anterior endplate osteophytes. T3-T4: Ankylosis via endplate osteophytes. Mild endplate spurring. No definite spinal stenosis. T4-T5: Ankylosis via endplate osteophytes. Superimposed small calcified disc bulging. Borderline to mild spinal stenosis (series 17, image 37). T5-T6: Ankylosis via anterior endplate osteophytes.  No stenosis. T6-T7: Anterior and posterior element ankylosis. No spinal stenosis. Facet hypertrophy such that there is some osseous foraminal stenosis greater on the left. T7-T8: Ankylosis. Mild epidural lipomatosis. Borderline to mild spinal stenosis (series 17, image 34). T8-T9: Ankylosis with mild to moderate facet and ligament flavum hypertrophy with calcified ligament flavum. Mild endplate spurring. Mild to moderate spinal stenosis suspected (series 12, image 103 and series 17, image 34). T9-T10:  Ankylosis. Mild to moderate posterior element hypertrophy. Mostly far lateral endplate spurring. Borderline to mild spinal stenosis. T10-T11: Ankylosis. Severe facet hypertrophy greater on the right. No definite spinal stenosis but severe bilateral T10 foraminal stenosis. T11-T12: Interbody ankylosis.  No stenosis. T12-L1:  Interbody ankylosis.  No spinal stenosis. IMPRESSION: 1. Prior cervical ACDF at C5-C6 with solid arthrodesis. 2. Adjacent segment disease at C6-C7 with mild to moderate suspected degenerative cervical spinal stenosis. 3. Mild if any additional cervical spinal stenosis elsewhere, such as C2-C3. 4. Widespread thoracic spine ankylosis from T2 inferiorly. This is primarily due to flowing endplate osteophytes, but there is superimposed posterior element ankylosis from T6-T7 to T10-T11. 5. Mild to moderate degenerative thoracic spinal stenosis suspected at T1-T2 and T8-T9. 6. Mild if any additional thoracic spinal stenosis, such as at T4-T5 and T9-T10. 7. Severe thoracic facet degeneration at T10-T11 with severe T10 neural foraminal stenosis. 8.  No acute osseous abnormality identified. 9. Small layering left pleural effusion. Bilateral lower lobe atelectasis. Electronically Signed   By: Genevie Ann M.D.   On: 03/18/2017 20:03   Ct Lumbar Spine Wo Contrast  Result Date: 03/03/2017 CLINICAL DATA:  81 year old male with stroke and gait disturbance and flank pain. EXAM: CT LUMBAR SPINE WITHOUT CONTRAST TECHNIQUE: Multidetector CT imaging of the lumbar spine was performed without intravenous contrast administration. Multiplanar CT image reconstructions were also generated. COMPARISON:  Abdominal CT dated 02/01/2017 FINDINGS: Segmentation: 5 lumbar type vertebrae. Alignment: Normal. Vertebrae: No acute fracture. There are multilevel degenerative changes. Anterior osteophyte primarily at L2-L3 and L3-L4 as well as L5-S1. Paraspinal and other soft tissues: No paraspinal fluid collection or hematoma. Mild  aortoiliac atherosclerotic disease. Moderate right renal atrophy with pelviectasis of the right extrarenal pelvis. Disc levels: There are small calcifications involving the L4-L5 disc. Mild L4-L5 and L5-S1 diffuse disc bulge. IMPRESSION: 1. No acute fracture or subluxation. 2. Multilevel degenerative changes with anterior osteophyte and mild diffuse disc bulge. Electronically Signed   By: Anner Crete M.D.   On: 03/03/2017 23:29   Ct Cerebral Perfusion W Contrast  Result Date: 03/19/2017 CLINICAL DATA:  Gaze palsy. Right-sided weakness beginning yesterday. EXAM: CT ANGIOGRAPHY HEAD AND NECK CT PERFUSION BRAIN TECHNIQUE: Multidetector CT imaging of the head and neck was performed using the standard protocol during bolus administration of intravenous contrast. Multiplanar CT image reconstructions and MIPs were obtained to evaluate the vascular anatomy. Carotid stenosis measurements (when  applicable) are obtained utilizing NASCET criteria, using the distal internal carotid diameter as the denominator. Multiphase CT imaging of the brain was performed following IV bolus contrast injection. Subsequent parametric perfusion maps were calculated using RAPID software. CONTRAST:  90 cc Isovue 370 COMPARISON:  CT earlier same day.  CT angiography 02/04/2017. FINDINGS: CTA NECK FINDINGS Aortic arch: Aortic atherosclerosis. No dissection. Branching pattern of the brachiocephalic vessels from the arch is normal without origin stenosis. Right carotid system: Common carotid artery widely patent to the bifurcation region. Atherosclerotic calcification of the carotid bifurcation but no stenosis. Cervical ICA widely patent to the skullbase. Left carotid system: Common carotid artery widely patent to the bifurcation. Atherosclerotic calcification of the carotid bifurcation. 20% stenosis of the proximal ICA. Wide patency beyond that. Vertebral arteries: There is atherosclerotic disease of both vertebral artery origins with  stenosis estimated at 50% on the left and 70% on the right. Both vessels do show flow through the cervical region to the foramen magnum. Skeleton: Previous ACDF C5-6. Mild spondylosis above and below that. 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 Thyroid nodules unchanged since previous. Upper chest: Negative Review of the MIP images confirms the above findings CTA HEAD FINDINGS Anterior circulation: Both internal carotid arteries are patent through the skullbase and siphon regions. Atherosclerotic calcification in the siphon regions but no stenosis greater than 30%. Supraclinoid internal carotid arteries are stenotic, 50-70% on the right and 50% on the left. Anterior and middle cerebral vessels are patent without correctable proximal stenosis. Moderate narrowing A1 segment on the right. No large vessel occlusion. Posterior circulation: Both vertebral arteries are patent at the foramen magnum. The right vertebral artery is occluded or severely stenotic past PICA. Left vertebral artery is patent to the basilar. Basilar artery shows mild narrowing and irregularity but no focal or correctable stenosis. Posterior circulation branch vessels are patent. Left PCA arises from the anterior circulation. Venous sinuses: Patent and normal Anatomic variants: None significant Delayed phase: No abnormal enhancement. There is low-density in the midline at the pontomedullary junction that I think is a real finding an was present on the immediate prior exam. This probably represents a brainstem infarction. Review of the MIP images confirms the above findings CT Brain Perfusion Findings: CBF (<30%) Volume: 97mL Perfusion (Tmax>6.0s) volume: 22mL Mismatch Volume: 80mL Infarction Location:None identified. IMPRESSION: I think there is an acute/ subacute infarction at the pontomedullary junction in the midline. This is below the region accurately evaluated by the perfusion exam. No medium or large  vessel occlusion.  CT perfusion is negative. CT angiography does not appear different than the study of 02/04/2017. Atherosclerotic change of both carotid bifurcations but without flow limiting stenosis. Atherosclerotic change in both carotid siphon regions and supraclinoid internal carotid arteries. Supraclinoid ICA stenosis measures 50-70% on the right and approximately 50% on the left. Bilateral vertebral artery origin stenoses estimated at 50% on the left and 70% on the right. Severe stenosis or occlusion of the right vertebral artery distal to PICA. Left vertebral artery sufficiently patent to the basilar. Basilar artery shows some narrowing and atherosclerotic irregularity. These results were called by telephone at the time of interpretation on 03/19/2017 at 1450 pm to Dr. Roland Rack , who verbally acknowledged these results. Electronically Signed   By: Nelson Chimes M.D.   On: 03/19/2017 15:24   Dg Chest Port 1 View  Result Date: 03/21/2017 CLINICAL DATA:  Respiratory failure. EXAM: PORTABLE CHEST 1 VIEW COMPARISON:  03/20/2017. FINDINGS:  Right IJ line in stable position. Cardiac pacer with lead tips in right atrium and right ventricle. Cardiomegaly. Normal pulmonary vascularity. Low lung volumes with basilar atelectasis. Mild basilar infiltrates cannot be excluded . Small left pleural effusion cannot be excluded. No pneumothorax. Contrast noted in the stomach. Surgical clips noted over the left chest. Vascular stent noted over the right subclavian region. Prior cervical spine fusion . IMPRESSION: 1. Right IJ line stable position. 2. Stable cardiomegaly.  Cardiac pacer stable position. 3. Low lung volumes with basilar atelectasis. Mild left base infiltrate cannot be excluded. Small left pleural effusion cannot be excluded . Electronically Signed   By: [name]  Register   On: 03/21/2017 07:25   Dg Chest Port 1 View  Result Date: 03/20/2017 CLINICAL DATA:  Myasthenia gravis crisis. Atrial  fibrillation, diabetes, end-stage dialysis dependent renal failure. Acute respiratory failure. EXAM: PORTABLE CHEST 1 VIEW COMPARISON:  Portable chest x-ray of March 19, 2017 FINDINGS: The trachea and esophagus have been extubated. The lungs are well-expanded. There is no focal infiltrate. The left lower lobe is less dense. There is no pleural effusion or pneumothorax. The cardiac silhouette remains enlarged. The pulmonary vascularity is not engorged. The ICD is in stable position. The right internal jugular venous catheter tip projects over the midportion of the SVC. There is a vascular graft in the distal aspect of the right subclavian region. The bony thorax exhibits no acute abnormality. IMPRESSION: Interval extubation of the trachea and esophagus. Clearing of left basilar atelectasis or pneumonia. Mild cardiomegaly without pulmonary edema. The support tubes and devices are in stable position. Electronically Signed   By: David  Martinique M.D.   On: 03/20/2017 07:25   Dg Chest Port 1 View  Result Date: 03/19/2017 CLINICAL DATA:  Intubated.  Followup. EXAM: PORTABLE CHEST 1 VIEW COMPARISON:  03/18/2017 FINDINGS: Endotracheal tube tip is 6 cm above the carina. Nasogastric tube is present within the stomach. Pacemaker leads appear unchanged. Right internal jugular central line tip in the SVC 2 cm above the right atrium. Moderate atelectasis persists in the left lower lobe. Mild atelectasis persists in the right lower lobe. Question developing interstitial edema pattern. IMPRESSION: Lines and tubes satisfactory. Persistent lower lobe atelectasis left worse than right. Question developing edema pattern. Electronically Signed   By: Nelson Chimes M.D.   On: 03/19/2017 07:52   Dg Chest Port 1 View  Result Date: 03/18/2017 CLINICAL DATA:  Central line placement. EXAM: PORTABLE CHEST 1 VIEW COMPARISON:  03/18/2017 and prior studies FINDINGS: A right IJ central venous catheter is noted with tip overlying the lower SVC.  There is no evidence of pneumothorax. An endotracheal tube with tip 5.2 cm above the carina, NG tube entering the stomach with tip off the field of view and left-sided pacemaker again noted. Bibasilar atelectasis, left-greater-than-right again noted. Mild pulmonary vascular congestion is present. A right subclavian stent is again identified. IMPRESSION: Right IJ central venous catheter with tip overlying the lower SVC. No evidence of pneumothorax. Support apparatus as described. Bibasilar atelectasis and mild pulmonary vascular congestion. Electronically Signed   By: Margarette Canada M.D.   On: 03/18/2017 22:11   Dg Chest Port 1 View  Result Date: 03/18/2017 CLINICAL DATA:  81 year old male dialysis patient with worsening generalized weakness, recently diagnosed with myasthenia gravis, worsening respiratory difficulty. Intubated. Hyperreflexia. Fever. EXAM: PORTABLE CHEST 1 VIEW COMPARISON:  Chest radiographs 03/17/2017 and earlier. Thoracic spine CT today reported separately. FINDINGS: Portable AP semi upright view at 1938 hours. Endotracheal tube tip projects  over the tracheal air column at the level of the clavicles. Enteric tube courses the abdomen, tip not included. Mild veiling opacity at the left lung base. No pneumothorax or pulmonary edema. Stable cardiac size and mediastinal contours. Stable left chest dual lead cardiac pacemaker. Allowing portable technique the right lung is clear right subclavian vascular stent. Surgical clips along the left chest wall. ACDF hardware again noted. IMPRESSION: 1. Endotracheal tube tip in good position. Enteric tube courses to the abdomen. 2. Small left pleural effusion. No other acute cardiopulmonary abnormality. Electronically Signed   By: Genevie Ann M.D.   On: 03/18/2017 20:11   Dg Abd Portable 1v  Result Date: 03/18/2017 CLINICAL DATA:  81 year old male dialysis patient with worsening generalized weakness, recently diagnosed with myasthenia gravis, worsening respiratory  difficulty. Intubated. Oral enteric tube. Hyperreflexia. Fever. EXAM: PORTABLE ABDOMEN - 1 VIEW COMPARISON:  Portable chest at 1938 hours. Thoracic spine CT today reported separately. FINDINGS: Portable AP supine view at 1948 hours. Enteric tube courses to the abdomen and the side hole projects at the mid gastric body level. Bowel gas pattern is within normal limits. Stable lung bases. No acute osseous abnormality identified. IMPRESSION: Enteric tube side hole up the level of the gastric body. Non-obstructed bowel gas pattern. Electronically Signed   By: Genevie Ann M.D.   On: 03/18/2017 20:13   Dg Swallowing Func-speech Pathology  Result Date: 03/20/2017 Objective Swallowing Evaluation: Type of Study: MBS-Modified Barium Swallow Study Patient Details Name: YADRIEL KERRIGAN MRN: [id number] Date of Birth: Feb 08, 1936 Today's Date: 03/20/2017 Time: SLP Start Time (ACUTE ONLY): 1409-SLP Stop Time (ACUTE ONLY): 1425 SLP Time Calculation (min) (ACUTE ONLY): 16 min Past Medical History: Past Medical History: Diagnosis Date . Atrial fibrillation ([place])  . Chronic kidney disease, stage IV (severe) ([facility])   baseline creatinine 2-3 . DDD (degenerative disc disease)  . Diabetes mellitus without complication ([place])  . ESRD (end stage renal disease) on dialysis ([place])  . Hyperlipidemia  . Hypertension  . Melanoma ([place]) 2010  removed at [facility] . Second degree heart block   a. s/p STJ dual chamber PPM - Dr [name] . Stroke [facility]) 01/2017 Past Surgical History: Past Surgical History: Procedure Laterality Date . AV FISTULA PLACEMENT Right 05/31/2014  Procedure: Right Arm Brachiocephalic ARTERIOVENOUS FISTULA CREATION  ;  Surgeon: Conrad Montgomery, MD;  Location: [facility];  Service: Vascular;  Laterality: Right; . CATARACT EXTRACTION W/ INTRAOCULAR LENS  IMPLANT, BILATERAL   . COLONOSCOPY   . EXCISION MELANOMA WITH SENTINEL LYMPH NODE BIOPSY Right 02/08/2016  Procedure: WIDE EXCISION RIGHT SHOULDER MELANOMA WITH RIGHT SENTINEL LYMPH NODE BIOPSY;  Surgeon:  [name], MD;  Location: [facility];  Service: General;  Laterality: Right; . LAMINECTOMY   . PERMANENT PACEMAKER INSERTION N/A 01/20/2014  STJ Assurity dual chamber pacemaker implanted by Dr [name] for 2nd degree AV block HPI: Pt is a 81 y.o.maleadmitted for possible myasthenia gravis crisis with increasing LE weakness and difficulty swallowing, although without coughing per H&P note. He had worsening respiratory status requiring intubation 7/3. New focal deficits inlcuding R sided weakness were noted on 7/4. CT perfusion study is concerning for an acute/subacute infarction at the pontomedullary junction in the midline. Pt self-extubated in the early morning 7/5. PMH includes ACDF (C5-C6), ESRD, DM, HLD, recent CVA, recently dx MG, second degree heart block, melanoma, DDD, a fib Subjective: pt alert, pleasant, needs cues, says he has trouble swallowing but has some difficulty clarifying Assessment / Plan / Recommendation CHL IP CLINICAL IMPRESSIONS  03/20/2017 Clinical Impression Pt presents with a mild oropharyngeal dysphagia that is likely multifactorial in nature, given neuro diagnoses (concern for acute MG flare and acute pontomedullary infarct), structural component (suspected osteophyte C4-C5 and cervical hardware C5-C6), and suspected esophageal issues. His oral phase is marked by mildly prolonged mastication and occasional premature spillage, particularly with solids and mixed consistencies, with material filling his vallecular space as he continues to masticate what remains orally. His swallow trigger is relatively timely for his age but with reduced hyolaryngeal elevation and particularly excursion. He has silent penetration during the swallow with thin liquids by straw that almost reaches his vocal folds, but this did clear well with a cued throat clear. Additional penetration occurred with thin liquids as he swallowed them with the barium tablet, but he spontaneously cleared his throat to expectorate it  during that instance. No other airway compromise was observed, although he does have moderate residuals in the vallecula and pyriform sinuses that increases with thinner liquids that increases aspiration risk. He needs cues to attempt a second swallow which then reduces the residuals. Suspect that this is more esophageal in nature due to increased sub-UES pressure. Brief scan appeared to reveal barium remaining throughout the esophagus upon completion of testing. Recommend Dys 2 diet and thin liquids by cup with use of second swallows, esophageal precautions, and general aspiraiton precautions.  SLP Visit Diagnosis Dysphagia, oropharyngeal phase (R13.12);Dysphagia, pharyngoesophageal phase (R13.14) Attention and concentration deficit following -- Frontal lobe and executive function deficit following -- Impact on safety and function Mild aspiration risk;Moderate aspiration risk   CHL IP TREATMENT RECOMMENDATION 03/20/2017 Treatment Recommendations Therapy as outlined in treatment plan below   Prognosis 03/20/2017 Prognosis for Safe Diet Advancement Good Barriers to Reach Goals Cognitive deficits Barriers/Prognosis Comment -- CHL IP DIET RECOMMENDATION 03/20/2017 SLP Diet Recommendations Dysphagia 2 (Fine chop) solids;Thin liquid Liquid Administration via Cup;No straw Medication Administration Whole meds with puree Compensations Minimize environmental distractions;Slow rate;Small sips/bites;Follow solids with liquid;Clear throat intermittently Postural Changes Remain semi-upright after after feeds/meals (Comment);Seated upright at 90 degrees   CHL IP OTHER RECOMMENDATIONS 03/20/2017 Recommended Consults -- Oral Care Recommendations Oral care BID Other Recommendations --   CHL IP FOLLOW UP RECOMMENDATIONS 03/20/2017 Follow up Recommendations Inpatient Rehab   CHL IP FREQUENCY AND DURATION 03/20/2017 Speech Therapy Frequency (ACUTE ONLY) min 2x/week Treatment Duration 2 weeks      CHL IP ORAL PHASE 03/20/2017 Oral Phase Impaired Oral  - Pudding Teaspoon -- Oral - Pudding Cup -- Oral - Honey Teaspoon -- Oral - Honey Cup -- Oral - Nectar Teaspoon -- Oral - Nectar Cup -- Oral - Nectar Straw -- Oral - Thin Teaspoon -- Oral - Thin Cup WFL Oral - Thin Straw WFL Oral - Puree WFL Oral - Mech Soft Impaired mastication Oral - Regular -- Oral - Multi-Consistency Premature spillage Oral - Pill WFL Oral Phase - Comment --  CHL IP PHARYNGEAL PHASE 03/20/2017 Pharyngeal Phase Impaired Pharyngeal- Pudding Teaspoon -- Pharyngeal -- Pharyngeal- Pudding Cup -- Pharyngeal -- Pharyngeal- Honey Teaspoon -- Pharyngeal -- Pharyngeal- Honey Cup -- Pharyngeal -- Pharyngeal- Nectar Teaspoon -- Pharyngeal -- Pharyngeal- Nectar Cup -- Pharyngeal -- Pharyngeal- Nectar Straw -- Pharyngeal -- Pharyngeal- Thin Teaspoon -- Pharyngeal -- Pharyngeal- Thin Cup Reduced anterior laryngeal mobility;Reduced laryngeal elevation;Penetration/Aspiration during swallow;Pharyngeal residue - valleculae;Pharyngeal residue - pyriform Pharyngeal Material enters airway, remains ABOVE vocal cords then ejected out Pharyngeal- Thin Straw Reduced anterior laryngeal mobility;Reduced laryngeal elevation;Penetration/Aspiration during swallow;Pharyngeal residue - valleculae;Pharyngeal residue - pyriform Pharyngeal Material enters airway,  remains ABOVE vocal cords and not ejected out Pharyngeal- Puree Reduced anterior laryngeal mobility;Reduced laryngeal elevation;Pharyngeal residue - valleculae Pharyngeal -- Pharyngeal- Mechanical Soft Reduced anterior laryngeal mobility;Reduced laryngeal elevation;Pharyngeal residue - valleculae;Delayed swallow initiation-vallecula Pharyngeal -- Pharyngeal- Regular -- Pharyngeal -- Pharyngeal- Multi-consistency Reduced anterior laryngeal mobility;Reduced laryngeal elevation;Pharyngeal residue - valleculae Pharyngeal -- Pharyngeal- Pill Reduced anterior laryngeal mobility;Reduced laryngeal elevation Pharyngeal -- Pharyngeal Comment --  CHL IP CERVICAL ESOPHAGEAL PHASE  03/20/2017 Cervical Esophageal Phase Impaired Pudding Teaspoon -- Pudding Cup -- Honey Teaspoon -- Honey Cup -- Nectar Teaspoon -- Nectar Cup -- Nectar Straw -- Thin Teaspoon -- Thin Cup Reduced cricopharyngeal relaxation Thin Straw Reduced cricopharyngeal relaxation Puree Reduced cricopharyngeal relaxation Mechanical Soft Reduced cricopharyngeal relaxation Regular -- Multi-consistency -- Pill Reduced cricopharyngeal relaxation Cervical Esophageal Comment -- No flowsheet data found. Germain Osgood 03/20/2017, 3:36 PM  Germain Osgood, M.A. CCC-SLP [phone]               Discharge Exam: Vitals:   03/25/17 0500 03/25/17 1047  BP: (!) 144/59 138/60  Pulse: 84 80  Resp: 20 20  Temp: 98.8 F (37.1 C) 98 F (36.7 C)   Vitals:   03/25/17 0145 03/25/17 0456 03/25/17 0500 03/25/17 1047  BP: 134/75  (!) 144/59 138/60  Pulse: 74  84 80  Resp: 20  20 20   Temp: 98.8 F (37.1 C)  98.8 F (37.1 C) 98 F (36.7 C)  TempSrc: Oral  Oral Oral  SpO2: 98%  99% 99%  Weight:  78 kg (171 lb 15.3 oz)    Height:        General: Pt is alert, follows commands appropriately, not in acute distress Cardiovascular: Regular rate and rhythm, no rubs, no gallops Respiratory: Clear to auscultation bilaterally, no wheezing, no crackles, no rhonchi Abdominal: Soft, non tender, non distended, bowel sounds +, no guarding Extremities: no edema, no cyanosis, pulses palpable bilaterally DP and PT Neuro: Grossly nonfocal  Discharge Instructions   Allergies as of 03/25/2017      Reactions   Amlodipine Besylate Swelling   Angioedema   Lisinopril Other (See Comments)   Renal insufficiency   Statins Other (See Comments)   Muscle aches   Ropinirole Other (See Comments)   Causes excessive grogginess the following morning after taking   Tape Other (See Comments)   SKIN IS THIN AND TAPE WILL ACTUALLY TEAR IT; PLEASE USE COBAN WRAP INSTEAD!!      Medication List    STOP taking these medications   calcium  acetate 667 MG capsule Commonly known as:  PHOSLO   DAILY-VITE PO   pyridostigmine 60 MG tablet Commonly known as:  MESTINON   rOPINIRole 0.5 MG tablet Commonly known as:  REQUIP   senna-docusate 8.6-50 MG tablet Commonly known as:  Senokot-S     TAKE these medications   acetaminophen 500 MG tablet Commonly known as:  TYLENOL Take 500 mg by mouth every 8 (eight) hours as needed for mild pain. (pain/headaches)   ARTIFICIAL TEARS OP Place 1 drop into both eyes 3 (three) times daily as needed (for dry eyes).   aspirin EC 81 MG tablet Take 81 mg by mouth at bedtime.   carvedilol 3.125 MG tablet Commonly known as:  COREG Take 1 tablet (3.125 mg total) by mouth 2 (two) times daily with a meal.   ceFAZolin 2-4 GM/100ML-% IVPB Commonly known as:  ANCEF Inject 100 mLs (2 g total) into the vein every Monday, Wednesday, and Friday at 6 PM. Start taking on:  03/26/2017   ezetimibe 10 MG tablet Commonly known as:  ZETIA Take 1 tablet (10 mg total) by mouth daily.   famotidine 40 MG/5ML suspension Commonly known as:  PEPCID Take 2.5 mLs (20 mg total) by mouth at bedtime. Start taking on:  03/26/2017   feeding supplement (NEPRO CARB STEADY) Liqd Take 237 mLs by mouth 2 (two) times daily between meals.   feeding supplement (PRO-STAT SUGAR FREE 64) Liqd Take 30 mLs by mouth 2 (two) times daily.   fenofibrate 160 MG tablet Take 1 tablet (160 mg total) by mouth daily.   finasteride 5 MG tablet Commonly known as:  PROSCAR Take 1 tablet (5 mg total) by mouth daily.   gabapentin 100 MG capsule Commonly known as:  NEURONTIN Take 1 capsule (100 mg total) by mouth at bedtime.   glimepiride 1 MG tablet Commonly known as:  AMARYL Take 1 tablet (1 mg total) by mouth daily with breakfast.   ipratropium 0.03 % nasal spray Commonly known as:  ATROVENT Place 2 sprays into both nostrils daily as needed for rhinitis.   ipratropium-albuterol 0.5-2.5 (3) MG/3ML Soln Commonly known as:   DUONEB Take 3 mLs by nebulization every 6 (six) hours as needed.   LORazepam 1 MG tablet Commonly known as:  ATIVAN Take 1 tablet (1 mg total) by mouth every 6 (six) hours as needed for anxiety.   multivitamin Tabs tablet Take 1 tablet by mouth at bedtime.   predniSONE 20 MG tablet Commonly known as:  DELTASONE Take 1 tablet (20 mg total) by mouth daily with breakfast. Please note to continue taking 20 mg tablet for 7 days and you must be evaluated by physician to determine next Prednisone dose and frequency. Start taking on:  03/26/2017   traZODone 50 MG tablet Commonly known as:  DESYREL Take 1 tablet (50 mg total) by mouth at bedtime as needed for sleep.   warfarin 1 MG tablet Commonly known as:  COUMADIN 1 1/2 tab daily What changed:  how much to take  how to take this  when to take this  additional instructions       Follow-up Information    [name], MD Follow up.   Specialty:  Family Medicine Contact information: [place] [address] [phone]            The results of significant diagnostics from this hospitalization (including imaging, microbiology, ancillary and laboratory) are listed below for reference.     Microbiology: Recent Results (from the past 240 hour(s))  Culture, Urine     Status: Abnormal   Collection Time: 03/18/17  9:44 AM  Result Value Ref Range Status   Specimen Description URINE, CATHETERIZED  Final   Special Requests NONE  Final   Culture >=100,000 COLONIES/mL ESCHERICHIA COLI (A)  Final   Report Status 03/20/2017 FINAL  Final   Organism ID, Bacteria ESCHERICHIA COLI (A)  Final      Susceptibility   Escherichia coli - MIC*    AMPICILLIN 8 SENSITIVE Sensitive     CEFAZOLIN <=4 SENSITIVE Sensitive     CEFTRIAXONE <=1 SENSITIVE Sensitive     CIPROFLOXACIN <=0.25 SENSITIVE Sensitive     GENTAMICIN <=1 SENSITIVE Sensitive     IMIPENEM <=0.25 SENSITIVE Sensitive     NITROFURANTOIN <=16  SENSITIVE Sensitive     TRIMETH/SULFA <=20 SENSITIVE Sensitive     AMPICILLIN/SULBACTAM 4 SENSITIVE Sensitive     PIP/TAZO <=4 SENSITIVE Sensitive     Extended ESBL NEGATIVE  Sensitive     * >=100,000 COLONIES/mL ESCHERICHIA COLI  Culture, blood (routine x 2)     Status: Abnormal   Collection Time: 03/18/17 10:17 AM  Result Value Ref Range Status   Specimen Description BLOOD LEFT ARM  Final   Special Requests IN PEDIATRIC BOTTLE Blood Culture adequate volume  Final   Culture  Setup Time   Final    PED GRAM NEGATIVE RODS CRITICAL RESULT CALLED TO, READ BACK BY AND VERIFIED WITH: TO KCOOK(PHARMd) BY TCLEVELAND 03/19/2017 AT 1:25AM    Culture ESCHERICHIA COLI (A)  Final   Report Status 03/21/2017 FINAL  Final   Organism ID, Bacteria ESCHERICHIA COLI  Final      Susceptibility   Escherichia coli - MIC*    AMPICILLIN 8 SENSITIVE Sensitive     CEFAZOLIN <=4 SENSITIVE Sensitive     CEFEPIME <=1 SENSITIVE Sensitive     CEFTAZIDIME <=1 SENSITIVE Sensitive     CEFTRIAXONE <=1 SENSITIVE Sensitive     CIPROFLOXACIN <=0.25 SENSITIVE Sensitive     GENTAMICIN <=1 SENSITIVE Sensitive     IMIPENEM <=0.25 SENSITIVE Sensitive     TRIMETH/SULFA <=20 SENSITIVE Sensitive     AMPICILLIN/SULBACTAM 4 SENSITIVE Sensitive     PIP/TAZO <=4 SENSITIVE Sensitive     Extended ESBL NEGATIVE Sensitive     * ESCHERICHIA COLI  Culture, blood (routine x 2)     Status: None   Collection Time: 03/18/17 10:17 AM  Result Value Ref Range Status   Specimen Description BLOOD LEFT HAND  Final   Special Requests IN PEDIATRIC BOTTLE Blood Culture adequate volume  Final   Culture NO GROWTH 5 DAYS  Final   Report Status 03/23/2017 FINAL  Final  Blood Culture ID Panel (Reflexed)     Status: Abnormal   Collection Time: 03/18/17 10:17 AM  Result Value Ref Range Status   Enterococcus species NOT DETECTED NOT DETECTED Final   Listeria monocytogenes NOT DETECTED NOT DETECTED Final   Staphylococcus species NOT DETECTED NOT  DETECTED Final   Staphylococcus aureus NOT DETECTED NOT DETECTED Final   Streptococcus species NOT DETECTED NOT DETECTED Final   Streptococcus agalactiae NOT DETECTED NOT DETECTED Final   Streptococcus pneumoniae NOT DETECTED NOT DETECTED Final   Streptococcus pyogenes NOT DETECTED NOT DETECTED Final   Acinetobacter baumannii NOT DETECTED NOT DETECTED Final   Enterobacteriaceae species DETECTED (A) NOT DETECTED Final    Comment: Enterobacteriaceae represent a large family of gram-negative bacteria, not a single organism. CRITICAL RESULT CALLED TO, READ BACK BY AND VERIFIED WITH: TO KCOOK(PHARMd) BY TCLEVELAND 03/19/2017 AT 1:25AM    Enterobacter cloacae complex NOT DETECTED NOT DETECTED Final   Escherichia coli DETECTED (A) NOT DETECTED Final    Comment: CRITICAL RESULT CALLED TO, READ BACK BY AND VERIFIED WITH: TO KCOOK(PHARMd) BY TCLEVELAND 03/19/2017 AT 1:25AM    Klebsiella oxytoca NOT DETECTED NOT DETECTED Final   Klebsiella pneumoniae NOT DETECTED NOT DETECTED Final   Proteus species NOT DETECTED NOT DETECTED Final   Serratia marcescens NOT DETECTED NOT DETECTED Final   Carbapenem resistance NOT DETECTED NOT DETECTED Final   Haemophilus influenzae NOT DETECTED NOT DETECTED Final   Neisseria meningitidis NOT DETECTED NOT DETECTED Final   Pseudomonas aeruginosa NOT DETECTED NOT DETECTED Final   Candida albicans NOT DETECTED NOT DETECTED Final   Candida glabrata NOT DETECTED NOT DETECTED Final   Candida krusei NOT DETECTED NOT DETECTED Final   Candida parapsilosis NOT DETECTED NOT DETECTED Final   Candida tropicalis NOT DETECTED NOT  DETECTED Final  MRSA PCR Screening     Status: None   Collection Time: 03/19/17  2:18 AM  Result Value Ref Range Status   MRSA by PCR NEGATIVE NEGATIVE Final    Comment:        The GeneXpert MRSA Assay (FDA approved for NASAL specimens only), is one component of a comprehensive MRSA colonization surveillance program. It is not intended to diagnose  MRSA infection nor to guide or monitor treatment for MRSA infections.   Culture, blood (routine x 2)     Status: None (Preliminary result)   Collection Time: 03/24/17  8:00 AM  Result Value Ref Range Status   Specimen Description BLOOD LEFT ANTECUBITAL  Final   Special Requests   Final    BOTTLES DRAWN AEROBIC AND ANAEROBIC Blood Culture adequate volume   Culture NO GROWTH 1 DAY  Final   Report Status PENDING  Incomplete  Culture, blood (routine x 2)     Status: None (Preliminary result)   Collection Time: 03/24/17  8:06 AM  Result Value Ref Range Status   Specimen Description BLOOD LEFT HAND  Final   Special Requests   Final    BOTTLES DRAWN AEROBIC ONLY Blood Culture adequate volume   Culture NO GROWTH 1 DAY  Final   Report Status PENDING  Incomplete     Labs: Basic Metabolic Panel:  Recent Labs Lab 03/19/17 0315 03/19/17 1658 03/20/17 0500 03/21/17 0545 03/22/17 0533 03/23/17 1025 03/24/17 0424 03/25/17 0447  NA 132*  --  131* 130* 130* 131* 134* 135  K 4.6  --  3.9 4.2 3.7 3.9 4.0 3.5  CL 96*  --  96* 95* 95* 96* 99* 97*  CO2 24  --  25 24 28 26 25 29   GLUCOSE 211*  --  270* 219* 112* 117* 110* 107*  BUN 23*  --  40* 63* 38* 68* 95* 50*  CREATININE 5.45*  --  4.26* 6.28* 3.99* 6.29* 7.34* 4.55*  CALCIUM 7.6*  --  7.9* 8.4* 8.4* 8.4* 8.4* 8.6*  MG 1.8 2.6* 2.7* 2.7* 2.2  --   --   --   PHOS 4.6 3.9 4.6 4.7* 3.5 4.9* 5.0* 4.7*   Liver Function Tests:  Recent Labs Lab 03/20/17 0500 03/23/17 1025 03/24/17 0424 03/25/17 0447  ALBUMIN 2.1* 2.4* 2.3* 2.5*   CBC:  Recent Labs Lab 03/21/17 0545 03/22/17 0533 03/23/17 0404 03/24/17 0424 03/25/17 0447  WBC 14.5* 13.3* 14.8* 15.9* 14.3*  HGB 7.3* 8.6* 8.7* 8.5* 8.9*  HCT 23.4* 27.0* 26.6* 26.4* 28.1*  MCV 95.9 94.7 94.0 94.3 95.3  PLT 224 232 238 258 238   BNP (last 3 results)  Recent Labs  03/17/17 1304  BNP 479.7*   CBG:  Recent Labs Lab 03/24/17 1954 03/25/17 0016 03/25/17 0355  03/25/17 0722 03/25/17 1134  GLUCAP 274* 205* 117* 105* 193*   SIGNED: Time coordinating discharge: 60  minutes  Faye Ramsay, MD  [facility] 03/25/2017, 2:09 PM Pager [phone]  If 7PM-7AM, please contact night-coverage [email or web address] Password [email or web address]

## 2017-03-25 NOTE — Progress Notes (Signed)
Physical Therapy Treatment Patient Details Name: James David MRN: [id number] DOB: 30-May-1936 Today's Date: 03/25/2017    History of Present Illness 81 y.o. male with medical history significant for diabetes mellitus 2 on oral agents, chronic kidney disease on dialysis, atrial fibrillation on chronic warfarin, anemia of chronic kidney disease, dyslipidemia. Recent admission for right subcortical stroke mid May 2018. Patient completed therapy in CIR and was discharged home at that time he was able to mobilize with the assistance of a rolling walker. Diagnosed  mid June with myasthenia gravis. Developed respiratory distress 7/3 and was intubated. Self extubated 7/5. CT - no sign of acute infarct.     PT Comments    Pt's goals are a bit ambitions and were downgraded today.  His wife chose to pursue SNF level rehab closer to where they live so she can get there more frequently.  Pt is progressing slowly and daily towards new goals and feels he is getting stronger every day.  PT will continue to follow acutely and frequency updated due to new d/c destination based on departmental protocols.   Follow Up Recommendations  SNF;Other (comment) (wife wants rehab closer to home, so choosing SNF)     Equipment Recommendations  Wheelchair (measurements PT);Wheelchair cushion (measurements PT);Hospital bed    Recommendations for Other Services   NA     Precautions / Restrictions Precautions Precautions: Fall Precaution Comments: generally weak    Mobility  Bed Mobility Overal bed mobility: Needs Assistance Bed Mobility: Rolling;Sidelying to Sit Rolling: Mod assist Sidelying to sit: Mod assist       General bed mobility comments: Pt is able to slowly progress bil legs over the side of the bed, he needs assist at his trunk to roll and get both hands on the bed rail and he needs support mostly at his trunk to come to sitting with posterior bias throughout transition until he can get his feet on  the floor and both hands on the standing frame (which helps him weight shift forward).   Transfers Overall transfer level: Needs assistance   Transfers: Sit to/from Stand;Stand Pivot Transfers Sit to Stand: Mod assist;From elevated surface Stand pivot transfers:  (with steady standing frame)       General transfer comment: Mod assist to stand supporting trunk and helping pt extend at the hips into the standing frame to transfer.  Multiple sit to stands preformed from the standing frame due to BM and total assist peri care.  Pt fatigues quickly over flexed knees and hips and cannot maintain standing with one person mod assist for more than 10 seconds at a time.    Ambulation/Gait             General Gait Details: unable at this time.           Balance Overall balance assessment: Needs assistance Sitting-balance support: Feet supported;Bilateral upper extremity supported Sitting balance-Leahy Scale: Poor Sitting balance - Comments: mod assist EOB until he could reach with both hands anteriorly and hold the bar on the standing frame.  Postural control: Posterior lean Standing balance support: Bilateral upper extremity supported Standing balance-Leahy Scale: Poor Standing balance comment: mod assist and support of standing frame.                             Cognition Arousal/Alertness: Awake/alert Behavior During Therapy: WFL for tasks assessed/performed Overall Cognitive Status: Impaired/Different from baseline Area of Impairment: Attention;Problem solving  Current Attention Level: Sustained   Following Commands: Follows one step commands consistently     Problem Solving: Slow processing;Decreased initiation General Comments: Pt continues to be slow to process, but once he does can follow basic commands.               Pertinent Vitals/Pain Pain Assessment: No/denies pain           PT Goals (current goals can now be found  in the care plan section) Acute Rehab PT Goals Patient Stated Goal: to get stronger and go to rehab closer to home PT Goal Formulation: With patient Time For Goal Achievement: 04/08/17 Potential to Achieve Goals: Fair Progress towards PT goals: Goals downgraded-see care plan    Frequency    Min 3X/week      PT Plan Discharge plan needs to be updated;Frequency needs to be updated       AM-PAC PT "6 Clicks" Daily Activity  Outcome Measure  Difficulty turning over in bed (including adjusting bedclothes, sheets and blankets)?: Total Difficulty moving from lying on back to sitting on the side of the bed? : Total Difficulty sitting down on and standing up from a chair with arms (e.g., wheelchair, bedside commode, etc,.)?: Total Help needed moving to and from a bed to chair (including a wheelchair)?: A Lot Help needed walking in hospital room?: Total Help needed climbing 3-5 steps with a railing? : Total 6 Click Score: 7    End of Session Equipment Utilized During Treatment: Gait belt Activity Tolerance: Patient limited by fatigue Patient left: in chair;with call bell/phone within reach;with chair alarm set Nurse Communication: Mobility status;Need for lift equipment PT Visit Diagnosis: Other abnormalities of gait and mobility (R26.89);Muscle weakness (generalized) (M62.81);Other symptoms and signs involving the nervous system (Z61.096)     Time: 1705-1740 PT Time Calculation (min) (ACUTE ONLY): 35 min  Charges:  $Therapeutic Activity: 23-37 mins          Nikeshia Keetch [place]. [place], [place], DPT [phone]            03/25/2017, 6:55 PM

## 2017-03-25 NOTE — Progress Notes (Signed)
CSW met with patient's wife to update that [facility] has not yet called back to confirm bed offer. CSW indicated that patient wouldn't be discharged until tomorrow, so will follow up with [facility] again to determine if they can admit patient when medically ready.  CSW will continue to follow to facilitate SNF discharge.  Laveda Abbe, [place] Clinical Social Worker [phone]

## 2017-03-25 NOTE — Progress Notes (Signed)
ANTICOAGULATION CONSULT NOTE - Follow Up Consult  Pharmacy Consult for Coumadin Indication: atrial fibrillation  Allergies  Allergen Reactions  . Amlodipine Besylate Swelling    Angioedema   . Lisinopril Other (See Comments)    Renal insufficiency  . Statins Other (See Comments)    Muscle aches  . Ropinirole Other (See Comments)    Causes excessive grogginess the following morning after taking  . Tape Other (See Comments)    SKIN IS THIN AND TAPE WILL ACTUALLY TEAR IT; PLEASE USE COBAN WRAP INSTEAD!!    Patient Measurements: Height: 5\' 10"  (177.8 cm) Weight: 171 lb 15.3 oz (78 kg) IBW/kg (Calculated) : 73  Vital Signs: Temp: 98 F (36.7 C) (07/10 1047) Temp Source: Oral (07/10 1047) BP: 138/60 (07/10 1047) Pulse Rate: 80 (07/10 1047)  Labs:  Recent Labs  03/23/17 0404 03/23/17 1025 03/24/17 0424 03/25/17 0447  HGB 8.7*  --  8.5* 8.9*  HCT 26.6*  --  26.4* 28.1*  PLT 238  --  258 238  LABPROT 23.6*  --  24.8* 21.3*  INR 2.06  --  2.20 1.82  CREATININE  --  6.29* 7.34* 4.55*   ESRD  Assessment:  Patient is on chronic Coumadin therapy PTA for atrial fibrillation. Supratherapeutic INR (5/9) on admission 03/17/17. and received Vitamin K. Coumadin resumed on 7/6; INR subtherapeutic today after usual home dose last night. On Aspirin 325 mg daily since 03/19/17.   Hgb low-stable; pltc wnl.  Aranesp 100 mcg given on 7/3, next due 7/11 with hemodialysis.  PTA Coumadin regimen: 1.5 mg TTSS and 1 mg on MWF   Goal of Therapy:  INR 2-3 Monitor platelets by anticoagulation protocol: Yes   Plan:   Coumadin 2.5 mg today.  Daily PT/INR.  Decrease Aspirin from 325 to 81 mg as prior to admission?  Dierdre Harness, PharmD Clinical Pharmacist [phone] (Pager) 03/25/2017 3:38 PM

## 2017-03-25 NOTE — Clinical Social Work Note (Signed)
Clinical Social Work Assessment  Patient Details  Name: James David MRN: [id number] Date of Birth: 1936/08/21  Date of referral:  03/25/17               Reason for consult:  Facility Placement, Discharge Planning                Permission sought to share information with:  Facility Sport and exercise psychologist, Family Supports Permission granted to share information::  Yes, Verbal Permission Granted  Name::     Building surveyor::  SNF  Relationship::  Wife  Contact Information:     Housing/Transportation Living arrangements for the past 2 months:  Single Family Home Source of Information:  Patient, Spouse Patient Interpreter Needed:  None Criminal Activity/Legal Involvement Pertinent to Current Situation/Hospitalization:  No - Comment as needed Significant Relationships:  Spouse Lives with:  Self, Spouse Do you feel safe going back to the place where you live?  Yes Need for family participation in patient care:  No (Coment)  Care giving concerns:  Patient has been living at home with spouse, but needs a short term rehab stay in order to improve ability to independently complete ADLs.   Social Worker assessment / plan:  CSW introduced self to patient and patient's spouse, James David, at bedside. CSW confirmed plan to pursue SNF placement. Patient's wife indicated preference for [facility] in [place]. CSW faxed out referral and contacted [facility] to check in on a possible bed offer. CSW left voicemail. CSW will follow to determine bed offer and facilitate discharge to SNF.  Employment status:  Retired Nurse, adult PT Recommendations:  Inpatient [place] / Referral to community resources:  [place]  Patient/Family's Response to care:  Patient agreeable to SNF placement.  Patient/Family's Understanding of and Emotional Response to Diagnosis, Current Treatment, and Prognosis:  Patient and patient's spouse seem to understand  patient's current functional limitations and need for short term rehab. Patient and patient's spouse indicated understanding of CSW role in discharge planning.  Emotional Assessment Appearance:  Appears stated age Attitude/Demeanor/Rapport:    Affect (typically observed):  Appropriate Orientation:  Oriented to Situation, Oriented to  Time, Oriented to Place, Oriented to Self Alcohol / Substance use:  Not Applicable Psych involvement (Current and /or in the community):  No (Comment)  Discharge Needs  Concerns to be addressed:  Care Coordination, Discharge Planning Concerns Readmission within the last 30 days:  No Current discharge risk:  Physical Impairment Barriers to Discharge:  Continued Medical Work up   [employer], [place] 03/25/2017, 11:10 AM

## 2017-03-26 ENCOUNTER — Ambulatory Visit: Payer: Medicare Other | Admitting: Podiatry

## 2017-03-26 LAB — RENAL FUNCTION PANEL
ANION GAP: 10 (ref 5–15)
Albumin: 2.5 g/dL — ABNORMAL LOW (ref 3.5–5.0)
BUN: 81 mg/dL — AB (ref 6–20)
CHLORIDE: 97 mmol/L — AB (ref 101–111)
CO2: 26 mmol/L (ref 22–32)
Calcium: 8.6 mg/dL — ABNORMAL LOW (ref 8.9–10.3)
Creatinine, Ser: 6.09 mg/dL — ABNORMAL HIGH (ref 0.61–1.24)
GFR calc Af Amer: 9 mL/min — ABNORMAL LOW (ref >60)
GFR calc non Af Amer: 8 mL/min — ABNORMAL LOW (ref >60)
GLUCOSE: 155 mg/dL — AB (ref 65–99)
POTASSIUM: 3.8 mmol/L (ref 3.5–5.1)
Phosphorus: 6.2 mg/dL — ABNORMAL HIGH (ref 2.5–4.6)
Sodium: 133 mmol/L — ABNORMAL LOW (ref 135–145)

## 2017-03-26 LAB — CBC
HEMATOCRIT: 26 % — AB (ref 39.0–52.0)
HEMOGLOBIN: 8.4 g/dL — AB (ref 13.0–17.0)
MCH: 30.7 pg (ref 26.0–34.0)
MCHC: 32.3 g/dL (ref 30.0–36.0)
MCV: 94.9 fL (ref 78.0–100.0)
Platelets: 229 10*3/uL (ref 150–400)
RBC: 2.74 MIL/uL — ABNORMAL LOW (ref 4.22–5.81)
RDW: 14.9 % (ref 11.5–15.5)
WBC: 10.3 10*3/uL (ref 4.0–10.5)

## 2017-03-26 LAB — GLUCOSE, CAPILLARY
GLUCOSE-CAPILLARY: 258 mg/dL — AB (ref 65–99)
Glucose-Capillary: 136 mg/dL — ABNORMAL HIGH (ref 65–99)
Glucose-Capillary: 223 mg/dL — ABNORMAL HIGH (ref 65–99)

## 2017-03-26 LAB — PROTIME-INR
INR: 1.61
Prothrombin Time: 19.4 seconds — ABNORMAL HIGH (ref 11.4–15.2)

## 2017-03-26 MED ORDER — PREDNISONE 20 MG PO TABS: 20.0000 mg | ORAL_TABLET | Freq: Every day | ORAL | 1 refills | Status: AC

## 2017-03-26 MED ORDER — DARBEPOETIN ALFA 100 MCG/0.5ML IJ SOSY
PREFILLED_SYRINGE | INTRAMUSCULAR | Status: AC
  Administered 2017-03-26: 100 ug via INTRAVENOUS
  Filled 2017-03-26: qty 0.5

## 2017-03-26 MED ORDER — LIDOCAINE-PRILOCAINE 2.5-2.5 % EX CREA: 1.0000 "application " | TOPICAL_CREAM | CUTANEOUS | Status: DC | PRN

## 2017-03-26 MED ORDER — HEPARIN SODIUM (PORCINE) 1000 UNIT/ML DIALYSIS: 1000.0000 [IU] | INTRAMUSCULAR | Status: DC | PRN

## 2017-03-26 MED ORDER — DOXERCALCIFEROL 4 MCG/2ML IV SOLN
INTRAVENOUS | Status: AC
  Administered 2017-03-26: 4 ug via INTRAVENOUS
  Filled 2017-03-26: qty 2

## 2017-03-26 MED ORDER — SODIUM CHLORIDE 0.9 % IV SOLN: 100.0000 mL | INTRAVENOUS | Status: DC | PRN

## 2017-03-26 MED ORDER — PENTAFLUOROPROP-TETRAFLUOROETH EX AERO: 1.0000 "application " | INHALATION_SPRAY | CUTANEOUS | Status: DC | PRN

## 2017-03-26 MED ORDER — LIDOCAINE HCL (PF) 1 % IJ SOLN: 5.0000 mL | INTRAMUSCULAR | Status: DC | PRN

## 2017-03-26 MED ORDER — ALTEPLASE 2 MG IJ SOLR: 2.0000 mg | Freq: Once | INTRAMUSCULAR | Status: DC | PRN

## 2017-03-26 NOTE — Progress Notes (Signed)
Discharge to: [facility] Anticipated discharge date: 03/26/17 Family notified: Yes, at bedside Transportation by: [facility]  Report #: [phone]  [place] signing off.  Laveda Abbe LCSW [phone]

## 2017-03-26 NOTE — Progress Notes (Signed)
[facility] transported pt at 8pm. Wife at bedside.

## 2017-03-26 NOTE — Progress Notes (Signed)
RN tried to call to give report to facility but was not able to reach RN. Detailed report was given to [facility].

## 2017-03-26 NOTE — Progress Notes (Signed)
VC and NIF has not been done due to patient being discharged.

## 2017-03-26 NOTE — Discharge Summary (Signed)
[facility]  Physician Discharge Summary   Patient ID: James David MRN: [id number] DOB/AGE: 79-Feb-1937 79 y.o.  Admit date: 03/17/2017 Discharge date: 03/26/2017  PCP: [name], MD  DISCHARGE DIAGNOSES:  Principal Problem:   Myasthenia gravis in crisis [facility]) Active Problems:   Diabetes mellitus without complication ([place])   History of CVA in adulthood   Atrial fibrillation, permanent ([place])   Supratherapeutic INR   HTN (hypertension)   Dyslipidemia   ESRD on dialysis ([place])   Anemia   Pressure injury of skin   Acute respiratory failure with hypoxia ([facility])   Myasthenia gravis ([place])   Benign essential HTN   Labile blood glucose   Dysphagia   Leukocytosis   RECOMMENDATIONS FOR OUTPATIENT FOLLOW UP: 1. Please note that he is to continue treatment with antibiotic Cefazolin, which is to be given with HD sessions for three more doses (this will complete recommended 14 days therapy for E. Coli urosepsis and bacteremia) 2. Please also note that neurologist has recommended slow prednisone taper starting wtth 20 mg daily and reevaluating clinical status at follow up with Dr. [name] to determine next appropriate taper plan.  3. Please schedule appointment with pt's neurologist (Dr. [name]) in 1-2 weeks  4. Follow up on PT/INR and adjust the dose of Coumadin as indicated  5. Continue with HD MWF, needs transport with HD  6. Requip has been stopped until pt clinically improves and can be resumed once thought it is clinically necessary  7. Voiding trial in a few days once he is more active   DISCHARGE CONDITION: fair  Diet recommendation: Renal dysphagia 2 diet  Filed Weights   03/24/17 1800 03/25/17 0456 03/26/17 0500  Weight: 75 kg (165 lb 5.5 oz) 78 kg (171 lb 15.3 oz) 77.5 kg (170 lb 13.7 oz)    INITIAL HISTORY: 81 y.o.malewith a PMH as outlined below including but not limited to recent diagnosis of probable Myasthenia Gravis (was seen as outpatient and had  positive acetylcholine receptor antibodies). He was started on mestinon but did not tolerate it due to severe diarrhea. He has had progressive weakness for the past month or two to the point where he has had difficulty getting out of bed and has had difficulty swallowing. He has not had any diplopia or SOB at home. He was admitted 7/2 with generalized weakness. He was evaluated by neurology who felt that he likely did have Myasthenia and recommended treatment with IVIG for 5 days. On 7/3, he had hypoxia with mild increased WOB; therefore, PCCM was asked to see. He started improving. He is now on oral steroids.   Consultations:  Neurology  Critical care medicine  Nephrology   HOSPITAL COURSE:   Acute respiratory failure secondary to myasthenia gravis crisis Respiratory status is stable. Saturating well. Overall he is improved. Looking forward to rehabilitation.  Myasthenia gravis with crisis Patient did not tolerate Mestinon as an outpatient. IVIG completed per neuro. Prednisone taper has been recommended by neurology. This is to be done slowly over a long. Of time. For now, we will discharge patient on 20 mg of prednisone daily and then to determine by his neurologist at follow-up in 1-2 weeks.   Subjective progressive generalized weakness  In the setting of recent diagnosis of myasthenia gravis. With some fecal and urinary incontinence. Per neurology, pt's hyperreflexia possibly from previous cervical injury with possible new current myelopathy and likely exacerbated by sepsis. Per neurology, ? Small stroke on MRI likely artifact, would not anticoagulate.  E. Coli urosepsis  Initially placed on Rocephin. Subsequently changed over to cefazolin. Patient was also bacteremic with Escherichia coli. Now improved. Plan is to continue IV antibiotics with dialysis for 3 more doses.  ESRD on HD Followed by nephrology. He was dialyzed as per his usual schedule.  Iron deficiency  anemia Hemoglobin is stable.  Urinary retention Patient does make urine despite being end-stage renal disease. He had urinary retention requiring a Foley catheter. Voiding trial to be done after a few days of rehabilitation.  Overall, stable. Okay for discharge to skilled nursing facility after hemodialysis today.    PERTINENT LABS:  The results of significant diagnostics from this hospitalization (including imaging, microbiology, ancillary and laboratory) are listed below for reference.    Microbiology: Recent Results (from the past 240 hour(s))  Culture, Urine     Status: Abnormal   Collection Time: 03/18/17  9:44 AM  Result Value Ref Range Status   Specimen Description URINE, CATHETERIZED  Final   Special Requests NONE  Final   Culture >=100,000 COLONIES/mL ESCHERICHIA COLI (A)  Final   Report Status 03/20/2017 FINAL  Final   Organism ID, Bacteria ESCHERICHIA COLI (A)  Final      Susceptibility   Escherichia coli - MIC*    AMPICILLIN 8 SENSITIVE Sensitive     CEFAZOLIN <=4 SENSITIVE Sensitive     CEFTRIAXONE <=1 SENSITIVE Sensitive     CIPROFLOXACIN <=0.25 SENSITIVE Sensitive     GENTAMICIN <=1 SENSITIVE Sensitive     IMIPENEM <=0.25 SENSITIVE Sensitive     NITROFURANTOIN <=16 SENSITIVE Sensitive     TRIMETH/SULFA <=20 SENSITIVE Sensitive     AMPICILLIN/SULBACTAM 4 SENSITIVE Sensitive     PIP/TAZO <=4 SENSITIVE Sensitive     Extended ESBL NEGATIVE Sensitive     * >=100,000 COLONIES/mL ESCHERICHIA COLI  Culture, blood (routine x 2)     Status: Abnormal   Collection Time: 03/18/17 10:17 AM  Result Value Ref Range Status   Specimen Description BLOOD LEFT ARM  Final   Special Requests IN PEDIATRIC BOTTLE Blood Culture adequate volume  Final   Culture  Setup Time   Final    PED GRAM NEGATIVE RODS CRITICAL RESULT CALLED TO, READ BACK BY AND VERIFIED WITH: TO KCOOK(PHARMd) BY TCLEVELAND 03/19/2017 AT 1:25AM    Culture ESCHERICHIA COLI (A)  Final   Report Status 03/21/2017  FINAL  Final   Organism ID, Bacteria ESCHERICHIA COLI  Final      Susceptibility   Escherichia coli - MIC*    AMPICILLIN 8 SENSITIVE Sensitive     CEFAZOLIN <=4 SENSITIVE Sensitive     CEFEPIME <=1 SENSITIVE Sensitive     CEFTAZIDIME <=1 SENSITIVE Sensitive     CEFTRIAXONE <=1 SENSITIVE Sensitive     CIPROFLOXACIN <=0.25 SENSITIVE Sensitive     GENTAMICIN <=1 SENSITIVE Sensitive     IMIPENEM <=0.25 SENSITIVE Sensitive     TRIMETH/SULFA <=20 SENSITIVE Sensitive     AMPICILLIN/SULBACTAM 4 SENSITIVE Sensitive     PIP/TAZO <=4 SENSITIVE Sensitive     Extended ESBL NEGATIVE Sensitive     * ESCHERICHIA COLI  Culture, blood (routine x 2)     Status: None   Collection Time: 03/18/17 10:17 AM  Result Value Ref Range Status   Specimen Description BLOOD LEFT HAND  Final   Special Requests IN PEDIATRIC BOTTLE Blood Culture adequate volume  Final   Culture NO GROWTH 5 DAYS  Final   Report Status 03/23/2017 FINAL  Final  Blood Culture ID  Panel (Reflexed)     Status: Abnormal   Collection Time: 03/18/17 10:17 AM  Result Value Ref Range Status   Enterococcus species NOT DETECTED NOT DETECTED Final   Listeria monocytogenes NOT DETECTED NOT DETECTED Final   Staphylococcus species NOT DETECTED NOT DETECTED Final   Staphylococcus aureus NOT DETECTED NOT DETECTED Final   Streptococcus species NOT DETECTED NOT DETECTED Final   Streptococcus agalactiae NOT DETECTED NOT DETECTED Final   Streptococcus pneumoniae NOT DETECTED NOT DETECTED Final   Streptococcus pyogenes NOT DETECTED NOT DETECTED Final   Acinetobacter baumannii NOT DETECTED NOT DETECTED Final   Enterobacteriaceae species DETECTED (A) NOT DETECTED Final    Comment: Enterobacteriaceae represent a large family of gram-negative bacteria, not a single organism. CRITICAL RESULT CALLED TO, READ BACK BY AND VERIFIED WITH: TO KCOOK(PHARMd) BY TCLEVELAND 03/19/2017 AT 1:25AM    Enterobacter cloacae complex NOT DETECTED NOT DETECTED Final    Escherichia coli DETECTED (A) NOT DETECTED Final    Comment: CRITICAL RESULT CALLED TO, READ BACK BY AND VERIFIED WITH: TO KCOOK(PHARMd) BY TCLEVELAND 03/19/2017 AT 1:25AM    Klebsiella oxytoca NOT DETECTED NOT DETECTED Final   Klebsiella pneumoniae NOT DETECTED NOT DETECTED Final   Proteus species NOT DETECTED NOT DETECTED Final   Serratia marcescens NOT DETECTED NOT DETECTED Final   Carbapenem resistance NOT DETECTED NOT DETECTED Final   Haemophilus influenzae NOT DETECTED NOT DETECTED Final   Neisseria meningitidis NOT DETECTED NOT DETECTED Final   Pseudomonas aeruginosa NOT DETECTED NOT DETECTED Final   Candida albicans NOT DETECTED NOT DETECTED Final   Candida glabrata NOT DETECTED NOT DETECTED Final   Candida krusei NOT DETECTED NOT DETECTED Final   Candida parapsilosis NOT DETECTED NOT DETECTED Final   Candida tropicalis NOT DETECTED NOT DETECTED Final  MRSA PCR Screening     Status: None   Collection Time: 03/19/17  2:18 AM  Result Value Ref Range Status   MRSA by PCR NEGATIVE NEGATIVE Final    Comment:        The GeneXpert MRSA Assay (FDA approved for NASAL specimens only), is one component of a comprehensive MRSA colonization surveillance program. It is not intended to diagnose MRSA infection nor to guide or monitor treatment for MRSA infections.   Culture, blood (routine x 2)     Status: None (Preliminary result)   Collection Time: 03/24/17  8:00 AM  Result Value Ref Range Status   Specimen Description BLOOD LEFT ANTECUBITAL  Final   Special Requests   Final    BOTTLES DRAWN AEROBIC AND ANAEROBIC Blood Culture adequate volume   Culture NO GROWTH 2 DAYS  Final   Report Status PENDING  Incomplete  Culture, blood (routine x 2)     Status: None (Preliminary result)   Collection Time: 03/24/17  8:06 AM  Result Value Ref Range Status   Specimen Description BLOOD LEFT HAND  Final   Special Requests   Final    BOTTLES DRAWN AEROBIC ONLY Blood Culture adequate volume    Culture NO GROWTH 2 DAYS  Final   Report Status PENDING  Incomplete     Labs: Basic Metabolic Panel:  Recent Labs Lab 03/19/17 1658  03/20/17 0500 03/21/17 0545 03/22/17 0533 03/23/17 1025 03/24/17 0424 03/25/17 0447 03/26/17 0420  NA  --   < > 131* 130* 130* 131* 134* 135 133*  K  --   < > 3.9 4.2 3.7 3.9 4.0 3.5 3.8  CL  --   < > 96* 95* 95* 96*  99* 97* 97*  CO2  --   < > 25 24 28 26 25 29 26   GLUCOSE  --   < > 270* 219* 112* 117* 110* 107* 155*  BUN  --   < > 40* 63* 38* 68* 95* 50* 81*  CREATININE  --   < > 4.26* 6.28* 3.99* 6.29* 7.34* 4.55* 6.09*  CALCIUM  --   < > 7.9* 8.4* 8.4* 8.4* 8.4* 8.6* 8.6*  MG 2.6*  --  2.7* 2.7* 2.2  --   --   --   --   PHOS 3.9  --  4.6 4.7* 3.5 4.9* 5.0* 4.7* 6.2*  < > = values in this interval not displayed. Liver Function Tests:  Recent Labs Lab 03/20/17 0500 03/23/17 1025 03/24/17 0424 03/25/17 0447 03/26/17 0420  ALBUMIN 2.1* 2.4* 2.3* 2.5* 2.5*   CBC:  Recent Labs Lab 03/22/17 0533 03/23/17 0404 03/24/17 0424 03/25/17 0447 03/26/17 0420  WBC 13.3* 14.8* 15.9* 14.3* 10.3  HGB 8.6* 8.7* 8.5* 8.9* 8.4*  HCT 27.0* 26.6* 26.4* 28.1* 26.0*  MCV 94.7 94.0 94.3 95.3 94.9  PLT 232 238 258 238 229   BNP: BNP (last 3 results)  Recent Labs  03/17/17 1304  BNP 479.7*     CBG:  Recent Labs Lab 03/25/17 1134 03/25/17 1710 03/25/17 2006 03/26/17 0636 03/26/17 1200  GLUCAP 193* 252* 293* 136* 223*     IMAGING STUDIES Ct Angio Head W Or Wo Contrast  Result Date: 03/19/2017 CLINICAL DATA:  Gaze palsy. Right-sided weakness beginning yesterday. EXAM: CT ANGIOGRAPHY HEAD AND NECK CT PERFUSION BRAIN TECHNIQUE: Multidetector CT imaging of the head and neck was performed using the standard protocol during bolus administration of intravenous contrast. Multiplanar CT image reconstructions and MIPs were obtained to evaluate the vascular anatomy. Carotid stenosis measurements (when applicable) are obtained utilizing NASCET  criteria, using the distal internal carotid diameter as the denominator. Multiphase CT imaging of the brain was performed following IV bolus contrast injection. Subsequent parametric perfusion maps were calculated using RAPID software. CONTRAST:  90 cc Isovue 370 COMPARISON:  CT earlier same day.  CT angiography 02/04/2017. FINDINGS: CTA NECK FINDINGS Aortic arch: Aortic atherosclerosis. No dissection. Branching pattern of the brachiocephalic vessels from the arch is normal without origin stenosis. Right carotid system: Common carotid artery widely patent to the bifurcation region. Atherosclerotic calcification of the carotid bifurcation but no stenosis. Cervical ICA widely patent to the skullbase. Left carotid system: Common carotid artery widely patent to the bifurcation. Atherosclerotic calcification of the carotid bifurcation. 20% stenosis of the proximal ICA. Wide patency beyond that. Vertebral arteries: There is atherosclerotic disease of both vertebral artery origins with stenosis estimated at 50% on the left and 70% on the right. Both vessels do show flow through the cervical region to the foramen magnum. Skeleton: Previous ACDF C5-6. Mild spondylosis above and below that. 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 Thyroid nodules unchanged since previous. Upper chest: Negative Review of the MIP images confirms the above findings CTA HEAD FINDINGS Anterior circulation: Both internal carotid arteries are patent through the skullbase and siphon regions. Atherosclerotic calcification in the siphon regions but no stenosis greater than 30%. Supraclinoid internal carotid arteries are stenotic, 50-70% on the right and 50% on the left. Anterior and middle cerebral vessels are patent without correctable proximal stenosis. Moderate narrowing A1 segment on the right. No large vessel occlusion. Posterior circulation: Both vertebral arteries are patent at the foramen magnum.  The  right vertebral artery is occluded or severely stenotic past PICA. Left vertebral artery is patent to the basilar. Basilar artery shows mild narrowing and irregularity but no focal or correctable stenosis. Posterior circulation branch vessels are patent. Left PCA arises from the anterior circulation. Venous sinuses: Patent and normal Anatomic variants: None significant Delayed phase: No abnormal enhancement. There is low-density in the midline at the pontomedullary junction that I think is a real finding an was present on the immediate prior exam. This probably represents a brainstem infarction. Review of the MIP images confirms the above findings CT Brain Perfusion Findings: CBF (<30%) Volume: 19mL Perfusion (Tmax>6.0s) volume: 28mL Mismatch Volume: 68mL Infarction Location:None identified. IMPRESSION: I think there is an acute/ subacute infarction at the pontomedullary junction in the midline. This is below the region accurately evaluated by the perfusion exam. No medium or large vessel occlusion.  CT perfusion is negative. CT angiography does not appear different than the study of 02/04/2017. Atherosclerotic change of both carotid bifurcations but without flow limiting stenosis. Atherosclerotic change in both carotid siphon regions and supraclinoid internal carotid arteries. Supraclinoid ICA stenosis measures 50-70% on the right and approximately 50% on the left. Bilateral vertebral artery origin stenoses estimated at 50% on the left and 70% on the right. Severe stenosis or occlusion of the right vertebral artery distal to PICA. Left vertebral artery sufficiently patent to the basilar. Basilar artery shows some narrowing and atherosclerotic irregularity. These results were called by telephone at the time of interpretation on 03/19/2017 at 1450 pm to Dr. Roland Rack , who verbally acknowledged these results. Electronically Signed   By: Nelson Chimes M.D.   On: 03/19/2017 15:24   Dg Chest 2 View  Result Date:  03/17/2017 CLINICAL DATA:  Generalize weakness for the past 3 days. History of atrial fibrillation, diabetes, end-stage renal disease. EXAM: CHEST  2 VIEW COMPARISON:  Chest x-ray of February 19, 2017 FINDINGS: The lungs are adequately inflated and clear. The cardiac silhouette is mildly enlarged. The pulmonary vascularity is normal. The mediastinum is normal in width. The ICD is in stable position. The trachea is midline. The bony thorax exhibits no acute abnormality. There surgical clips in the left axillary region. IMPRESSION: There is no pneumonia, CHF, nor other acute cardiopulmonary abnormality. Electronically Signed   By: David  Martinique M.D.   On: 03/17/2017 13:41   Ct Head Wo Contrast  Result Date: 03/19/2017 CLINICAL DATA:  New onset of left-sided neglect in weakness since yesterday. EXAM: CT HEAD WITHOUT CONTRAST TECHNIQUE: Contiguous axial images were obtained from the base of the skull through the vertex without intravenous contrast. COMPARISON:  02/16/2017 FINDINGS: Brain: Mild generalized atrophy. Moderate chronic small-vessel ischemic changes of the cerebral hemispheric white matter. Old left caudate infarction. No sign of acute infarction, mass lesion, hemorrhage, hydrocephalus or extra-axial collection. Vascular: There is atherosclerotic calcification of the major vessels at the base of the brain. Skull: Negative Sinuses/Orbits: Clear/normal Other: None significant IMPRESSION: No acute finding by CT. Atrophy and chronic small-vessel ischemic changes. Electronically Signed   By: Nelson Chimes M.D.   On: 03/19/2017 12:49   Ct Angio Neck W Or Wo Contrast  Result Date: 03/19/2017 CLINICAL DATA:  Gaze palsy. Right-sided weakness beginning yesterday. EXAM: CT ANGIOGRAPHY HEAD AND NECK CT PERFUSION BRAIN TECHNIQUE: Multidetector CT imaging of the head and neck was performed using the standard protocol during bolus administration of intravenous contrast. Multiplanar CT image reconstructions and MIPs were  obtained to evaluate the vascular anatomy. Carotid  stenosis measurements (when applicable) are obtained utilizing NASCET criteria, using the distal internal carotid diameter as the denominator. Multiphase CT imaging of the brain was performed following IV bolus contrast injection. Subsequent parametric perfusion maps were calculated using RAPID software. CONTRAST:  90 cc Isovue 370 COMPARISON:  CT earlier same day.  CT angiography 02/04/2017. FINDINGS: CTA NECK FINDINGS Aortic arch: Aortic atherosclerosis. No dissection. Branching pattern of the brachiocephalic vessels from the arch is normal without origin stenosis. Right carotid system: Common carotid artery widely patent to the bifurcation region. Atherosclerotic calcification of the carotid bifurcation but no stenosis. Cervical ICA widely patent to the skullbase. Left carotid system: Common carotid artery widely patent to the bifurcation. Atherosclerotic calcification of the carotid bifurcation. 20% stenosis of the proximal ICA. Wide patency beyond that. Vertebral arteries: There is atherosclerotic disease of both vertebral artery origins with stenosis estimated at 50% on the left and 70% on the right. Both vessels do show flow through the cervical region to the foramen magnum. Skeleton: Previous ACDF C5-6. Mild spondylosis above and below that. 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 Thyroid nodules unchanged since previous. Upper chest: Negative Review of the MIP images confirms the above findings CTA HEAD FINDINGS Anterior circulation: Both internal carotid arteries are patent through the skullbase and siphon regions. Atherosclerotic calcification in the siphon regions but no stenosis greater than 30%. Supraclinoid internal carotid arteries are stenotic, 50-70% on the right and 50% on the left. Anterior and middle cerebral vessels are patent without correctable proximal stenosis. Moderate narrowing A1 segment on the  right. No large vessel occlusion. Posterior circulation: Both vertebral arteries are patent at the foramen magnum. The right vertebral artery is occluded or severely stenotic past PICA. Left vertebral artery is patent to the basilar. Basilar artery shows mild narrowing and irregularity but no focal or correctable stenosis. Posterior circulation branch vessels are patent. Left PCA arises from the anterior circulation. Venous sinuses: Patent and normal Anatomic variants: None significant Delayed phase: No abnormal enhancement. There is low-density in the midline at the pontomedullary junction that I think is a real finding an was present on the immediate prior exam. This probably represents a brainstem infarction. Review of the MIP images confirms the above findings CT Brain Perfusion Findings: CBF (<30%) Volume: 70mL Perfusion (Tmax>6.0s) volume: 69mL Mismatch Volume: 91mL Infarction Location:None identified. IMPRESSION: I think there is an acute/ subacute infarction at the pontomedullary junction in the midline. This is below the region accurately evaluated by the perfusion exam. No medium or large vessel occlusion.  CT perfusion is negative. CT angiography does not appear different than the study of 02/04/2017. Atherosclerotic change of both carotid bifurcations but without flow limiting stenosis. Atherosclerotic change in both carotid siphon regions and supraclinoid internal carotid arteries. Supraclinoid ICA stenosis measures 50-70% on the right and approximately 50% on the left. Bilateral vertebral artery origin stenoses estimated at 50% on the left and 70% on the right. Severe stenosis or occlusion of the right vertebral artery distal to PICA. Left vertebral artery sufficiently patent to the basilar. Basilar artery shows some narrowing and atherosclerotic irregularity. These results were called by telephone at the time of interpretation on 03/19/2017 at 1450 pm to Dr. Roland Rack , who verbally acknowledged  these results. Electronically Signed   By: Nelson Chimes M.D.   On: 03/19/2017 15:24   Ct Cervical Spine Wo Contrast  Result Date: 03/03/2017 CLINICAL DATA:  81 year old male with gait disturbance. History of stroke.  Patient presenting with back pain. EXAM: CT CERVICAL SPINE WITHOUT CONTRAST TECHNIQUE: Multidetector CT imaging of the cervical spine was performed without intravenous contrast. Multiplanar CT image reconstructions were also generated. COMPARISON:  Cervical spine CT dated 02/04/2017 FINDINGS: Alignment: No acute subluxation. Skull base and vertebrae: No acute fracture. The bones are osteopenic. C5-C6 disc spacer and anterior fusion plate and screws noted. Soft tissues and spinal canal: No prevertebral fluid or swelling. No visible canal hematoma. Disc levels: There is multilevel degenerative changes with endplate irregularity. Multilevel anterior osteophyte noted most prominent at C4-C5 and C6-C7. There is mild neural foramina narrowing at C6-C7. Upper chest: Negative. Other: There is a 1.8 x 2.3 cm right thyroid hypodense nodule. Further evaluation with ultrasound recommended. Bilateral calcified carotid bulb plaques noted. IMPRESSION: 1. No acute cervical spine pathology.  C5-C6 anterior fusion. 2. Degenerative changes and mild narrowing of the neural foramina at C6-C7. 3. Bilateral carotid bulb calcified plaque. 4. Right thyroid hypodense nodule. Ultrasound may provide better evaluation. Electronically Signed   By: Anner Crete M.D.   On: 03/03/2017 22:42   Ct Cervical Spine W Contrast  Result Date: 03/18/2017 CLINICAL DATA:  81 year old male dialysis patient with worsening generalized weakness. Recently diagnosed with myasthenia gravis. Hyper reflexia. Fever. EXAM: CT CERVICAL SPINE WITH CONTRAST CT THORACIC SPINE WITH CONTRAST TECHNIQUE: Multidetector CT imaging of the cervical and thoracic spine was performed with intravenous contrast. Multiplanar CT image reconstructions were also  generated. CONTRAST:  167mL ISOVUE-300 IOPAMIDOL (ISOVUE-300) INJECTION 61% COMPARISON:  Lumbar spine CT 03/03/2017. CT cervical spine 03/03/2017, and earlier. FINDINGS: CT CERVICAL SPINE FINDINGS Alignment: Stable cervical lordosis. Cervicothoracic junction alignment is within normal limits. Bilateral posterior element alignment is within normal limits. Skull base and vertebrae: Visualized skull base is intact. No atlanto-occipital dissociation. No acute osseous abnormality identified. Prior C5-C6 ACDF. Soft tissues and spinal canal: Negative visualized posterior fossa. Major vascular structures in the neck and at the skullbase appear patent. Neck soft tissues are stable compared to the CTA head and neck 02/04/2017, including right tracheoesophageal groove soft tissue nodule. Disc levels: C2-C3: Mild to moderate facet hypertrophy greater on the right. Borderline to mild spinal stenosis. Mild right C3 foraminal stenosis. C3-C4: Mild to moderate facet hypertrophy greater on the right. Ligament flavum hypertrophy suspected (series 11, image 39). Up to mild spinal stenosis. Borderline to mild right C4 foraminal stenosis. C4-C5: Disc space loss but mostly anterior endplate spurring. Mild facet hypertrophy. Ligament flavum hypertrophy suspected. No definite spinal stenosis. Borderline to mild left C5 foraminal stenosis. C5-C6:  Prior ACDF.  Solid arthrodesis.  No hardware loosening. C6-C7: Circumferential disc osteophyte complex with broad-based posterior component. Suspect mild to moderate spinal stenosis. Moderate to severe bilateral C7 foraminal stenosis in part due to endplate spurring. C7-T1: Right eccentric disc osteophyte complex. Mild to moderate facet hypertrophy greater on the left. Endplate spurring greater on the right. No definite spinal stenosis. Mild right C8 foraminal stenosis. CT THORACIC SPINE FINDINGS Segmentation:  Normal. Alignment: Exaggerated upper thoracic kyphosis.  No scoliosis. Vertebrae:  Flowing osteophytes throughout the thoracic spine from T2 to the thoracolumbar junction. There is superimposed posterior element ankylosis beginning at T6-T7, and a extending to T10-T11. No acute osseous abnormality identified. Paraspinal and other soft tissues: Left chest cardiac pacemaker. Major airways are patent. Small layering left pleural effusion. Dependent lower lobe opacity most resembles atelectasis. Mild thyroid goiter. No mediastinal lymphadenopathy. Calcified aortic atherosclerosis. Partial atrophy of the visible kidneys greater on the right. Negative visualized posterior paraspinal soft tissues.  Disc levels: T1-T2: Partially calcified central disc protrusion. Mild to moderate spinal stenosis suspected (series 17, image 42). T2-T3: Ankylosis begins at this level via anterior endplate osteophytes. T3-T4: Ankylosis via endplate osteophytes. Mild endplate spurring. No definite spinal stenosis. T4-T5: Ankylosis via endplate osteophytes. Superimposed small calcified disc bulging. Borderline to mild spinal stenosis (series 17, image 37). T5-T6: Ankylosis via anterior endplate osteophytes.  No stenosis. T6-T7: Anterior and posterior element ankylosis. No spinal stenosis. Facet hypertrophy such that there is some osseous foraminal stenosis greater on the left. T7-T8: Ankylosis. Mild epidural lipomatosis. Borderline to mild spinal stenosis (series 17, image 34). T8-T9: Ankylosis with mild to moderate facet and ligament flavum hypertrophy with calcified ligament flavum. Mild endplate spurring. Mild to moderate spinal stenosis suspected (series 12, image 103 and series 17, image 34). T9-T10: Ankylosis. Mild to moderate posterior element hypertrophy. Mostly far lateral endplate spurring. Borderline to mild spinal stenosis. T10-T11: Ankylosis. Severe facet hypertrophy greater on the right. No definite spinal stenosis but severe bilateral T10 foraminal stenosis. T11-T12: Interbody ankylosis.  No stenosis. T12-L1:   Interbody ankylosis.  No spinal stenosis. IMPRESSION: 1. Prior cervical ACDF at C5-C6 with solid arthrodesis. 2. Adjacent segment disease at C6-C7 with mild to moderate suspected degenerative cervical spinal stenosis. 3. Mild if any additional cervical spinal stenosis elsewhere, such as C2-C3. 4. Widespread thoracic spine ankylosis from T2 inferiorly. This is primarily due to flowing endplate osteophytes, but there is superimposed posterior element ankylosis from T6-T7 to T10-T11. 5. Mild to moderate degenerative thoracic spinal stenosis suspected at T1-T2 and T8-T9. 6. Mild if any additional thoracic spinal stenosis, such as at T4-T5 and T9-T10. 7. Severe thoracic facet degeneration at T10-T11 with severe T10 neural foraminal stenosis. 8.  No acute osseous abnormality identified. 9. Small layering left pleural effusion. Bilateral lower lobe atelectasis. Electronically Signed   By: Genevie Ann M.D.   On: 03/18/2017 20:03   Ct Thoracic Spine W Contrast  Result Date: 03/18/2017 CLINICAL DATA:  81 year old male dialysis patient with worsening generalized weakness. Recently diagnosed with myasthenia gravis. Hyper reflexia. Fever. EXAM: CT CERVICAL SPINE WITH CONTRAST CT THORACIC SPINE WITH CONTRAST TECHNIQUE: Multidetector CT imaging of the cervical and thoracic spine was performed with intravenous contrast. Multiplanar CT image reconstructions were also generated. CONTRAST:  127mL ISOVUE-300 IOPAMIDOL (ISOVUE-300) INJECTION 61% COMPARISON:  Lumbar spine CT 03/03/2017. CT cervical spine 03/03/2017, and earlier. FINDINGS: CT CERVICAL SPINE FINDINGS Alignment: Stable cervical lordosis. Cervicothoracic junction alignment is within normal limits. Bilateral posterior element alignment is within normal limits. Skull base and vertebrae: Visualized skull base is intact. No atlanto-occipital dissociation. No acute osseous abnormality identified. Prior C5-C6 ACDF. Soft tissues and spinal canal: Negative visualized posterior fossa.  Major vascular structures in the neck and at the skullbase appear patent. Neck soft tissues are stable compared to the CTA head and neck 02/04/2017, including right tracheoesophageal groove soft tissue nodule. Disc levels: C2-C3: Mild to moderate facet hypertrophy greater on the right. Borderline to mild spinal stenosis. Mild right C3 foraminal stenosis. C3-C4: Mild to moderate facet hypertrophy greater on the right. Ligament flavum hypertrophy suspected (series 11, image 39). Up to mild spinal stenosis. Borderline to mild right C4 foraminal stenosis. C4-C5: Disc space loss but mostly anterior endplate spurring. Mild facet hypertrophy. Ligament flavum hypertrophy suspected. No definite spinal stenosis. Borderline to mild left C5 foraminal stenosis. C5-C6:  Prior ACDF.  Solid arthrodesis.  No hardware loosening. C6-C7: Circumferential disc osteophyte complex with broad-based posterior component. Suspect mild to moderate spinal stenosis. Moderate  to severe bilateral C7 foraminal stenosis in part due to endplate spurring. C7-T1: Right eccentric disc osteophyte complex. Mild to moderate facet hypertrophy greater on the left. Endplate spurring greater on the right. No definite spinal stenosis. Mild right C8 foraminal stenosis. CT THORACIC SPINE FINDINGS Segmentation:  Normal. Alignment: Exaggerated upper thoracic kyphosis.  No scoliosis. Vertebrae: Flowing osteophytes throughout the thoracic spine from T2 to the thoracolumbar junction. There is superimposed posterior element ankylosis beginning at T6-T7, and a extending to T10-T11. No acute osseous abnormality identified. Paraspinal and other soft tissues: Left chest cardiac pacemaker. Major airways are patent. Small layering left pleural effusion. Dependent lower lobe opacity most resembles atelectasis. Mild thyroid goiter. No mediastinal lymphadenopathy. Calcified aortic atherosclerosis. Partial atrophy of the visible kidneys greater on the right. Negative visualized  posterior paraspinal soft tissues. Disc levels: T1-T2: Partially calcified central disc protrusion. Mild to moderate spinal stenosis suspected (series 17, image 42). T2-T3: Ankylosis begins at this level via anterior endplate osteophytes. T3-T4: Ankylosis via endplate osteophytes. Mild endplate spurring. No definite spinal stenosis. T4-T5: Ankylosis via endplate osteophytes. Superimposed small calcified disc bulging. Borderline to mild spinal stenosis (series 17, image 37). T5-T6: Ankylosis via anterior endplate osteophytes.  No stenosis. T6-T7: Anterior and posterior element ankylosis. No spinal stenosis. Facet hypertrophy such that there is some osseous foraminal stenosis greater on the left. T7-T8: Ankylosis. Mild epidural lipomatosis. Borderline to mild spinal stenosis (series 17, image 34). T8-T9: Ankylosis with mild to moderate facet and ligament flavum hypertrophy with calcified ligament flavum. Mild endplate spurring. Mild to moderate spinal stenosis suspected (series 12, image 103 and series 17, image 34). T9-T10: Ankylosis. Mild to moderate posterior element hypertrophy. Mostly far lateral endplate spurring. Borderline to mild spinal stenosis. T10-T11: Ankylosis. Severe facet hypertrophy greater on the right. No definite spinal stenosis but severe bilateral T10 foraminal stenosis. T11-T12: Interbody ankylosis.  No stenosis. T12-L1:  Interbody ankylosis.  No spinal stenosis. IMPRESSION: 1. Prior cervical ACDF at C5-C6 with solid arthrodesis. 2. Adjacent segment disease at C6-C7 with mild to moderate suspected degenerative cervical spinal stenosis. 3. Mild if any additional cervical spinal stenosis elsewhere, such as C2-C3. 4. Widespread thoracic spine ankylosis from T2 inferiorly. This is primarily due to flowing endplate osteophytes, but there is superimposed posterior element ankylosis from T6-T7 to T10-T11. 5. Mild to moderate degenerative thoracic spinal stenosis suspected at T1-T2 and T8-T9. 6. Mild if  any additional thoracic spinal stenosis, such as at T4-T5 and T9-T10. 7. Severe thoracic facet degeneration at T10-T11 with severe T10 neural foraminal stenosis. 8.  No acute osseous abnormality identified. 9. Small layering left pleural effusion. Bilateral lower lobe atelectasis. Electronically Signed   By: Genevie Ann M.D.   On: 03/18/2017 20:03   Ct Lumbar Spine Wo Contrast  Result Date: 03/03/2017 CLINICAL DATA:  81 year old male with stroke and gait disturbance and flank pain. EXAM: CT LUMBAR SPINE WITHOUT CONTRAST TECHNIQUE: Multidetector CT imaging of the lumbar spine was performed without intravenous contrast administration. Multiplanar CT image reconstructions were also generated. COMPARISON:  Abdominal CT dated 02/01/2017 FINDINGS: Segmentation: 5 lumbar type vertebrae. Alignment: Normal. Vertebrae: No acute fracture. There are multilevel degenerative changes. Anterior osteophyte primarily at L2-L3 and L3-L4 as well as L5-S1. Paraspinal and other soft tissues: No paraspinal fluid collection or hematoma. Mild aortoiliac atherosclerotic disease. Moderate right renal atrophy with pelviectasis of the right extrarenal pelvis. Disc levels: There are small calcifications involving the L4-L5 disc. Mild L4-L5 and L5-S1 diffuse disc bulge. IMPRESSION: 1. No acute fracture or subluxation.  2. Multilevel degenerative changes with anterior osteophyte and mild diffuse disc bulge. Electronically Signed   By: Anner Crete M.D.   On: 03/03/2017 23:29   Ct Cerebral Perfusion W Contrast  Result Date: 03/19/2017 CLINICAL DATA:  Gaze palsy. Right-sided weakness beginning yesterday. EXAM: CT ANGIOGRAPHY HEAD AND NECK CT PERFUSION BRAIN TECHNIQUE: Multidetector CT imaging of the head and neck was performed using the standard protocol during bolus administration of intravenous contrast. Multiplanar CT image reconstructions and MIPs were obtained to evaluate the vascular anatomy. Carotid stenosis measurements (when  applicable) are obtained utilizing NASCET criteria, using the distal internal carotid diameter as the denominator. Multiphase CT imaging of the brain was performed following IV bolus contrast injection. Subsequent parametric perfusion maps were calculated using RAPID software. CONTRAST:  90 cc Isovue 370 COMPARISON:  CT earlier same day.  CT angiography 02/04/2017. FINDINGS: CTA NECK FINDINGS Aortic arch: Aortic atherosclerosis. No dissection. Branching pattern of the brachiocephalic vessels from the arch is normal without origin stenosis. Right carotid system: Common carotid artery widely patent to the bifurcation region. Atherosclerotic calcification of the carotid bifurcation but no stenosis. Cervical ICA widely patent to the skullbase. Left carotid system: Common carotid artery widely patent to the bifurcation. Atherosclerotic calcification of the carotid bifurcation. 20% stenosis of the proximal ICA. Wide patency beyond that. Vertebral arteries: There is atherosclerotic disease of both vertebral artery origins with stenosis estimated at 50% on the left and 70% on the right. Both vessels do show flow through the cervical region to the foramen magnum. Skeleton: Previous ACDF C5-6. Mild spondylosis above and below that. 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 Thyroid nodules unchanged since previous. Upper chest: Negative Review of the MIP images confirms the above findings CTA HEAD FINDINGS Anterior circulation: Both internal carotid arteries are patent through the skullbase and siphon regions. Atherosclerotic calcification in the siphon regions but no stenosis greater than 30%. Supraclinoid internal carotid arteries are stenotic, 50-70% on the right and 50% on the left. Anterior and middle cerebral vessels are patent without correctable proximal stenosis. Moderate narrowing A1 segment on the right. No large vessel occlusion. Posterior circulation: Both vertebral  arteries are patent at the foramen magnum. The right vertebral artery is occluded or severely stenotic past PICA. Left vertebral artery is patent to the basilar. Basilar artery shows mild narrowing and irregularity but no focal or correctable stenosis. Posterior circulation branch vessels are patent. Left PCA arises from the anterior circulation. Venous sinuses: Patent and normal Anatomic variants: None significant Delayed phase: No abnormal enhancement. There is low-density in the midline at the pontomedullary junction that I think is a real finding an was present on the immediate prior exam. This probably represents a brainstem infarction. Review of the MIP images confirms the above findings CT Brain Perfusion Findings: CBF (<30%) Volume: 61mL Perfusion (Tmax>6.0s) volume: 79mL Mismatch Volume: 62mL Infarction Location:None identified. IMPRESSION: I think there is an acute/ subacute infarction at the pontomedullary junction in the midline. This is below the region accurately evaluated by the perfusion exam. No medium or large vessel occlusion.  CT perfusion is negative. CT angiography does not appear different than the study of 02/04/2017. Atherosclerotic change of both carotid bifurcations but without flow limiting stenosis. Atherosclerotic change in both carotid siphon regions and supraclinoid internal carotid arteries. Supraclinoid ICA stenosis measures 50-70% on the right and approximately 50% on the left. Bilateral vertebral artery origin stenoses estimated at 50% on the left and 70% on the right.  Severe stenosis or occlusion of the right vertebral artery distal to PICA. Left vertebral artery sufficiently patent to the basilar. Basilar artery shows some narrowing and atherosclerotic irregularity. These results were called by telephone at the time of interpretation on 03/19/2017 at 1450 pm to Dr. Roland Rack , who verbally acknowledged these results. Electronically Signed   By: Nelson Chimes M.D.   On:  03/19/2017 15:24   Dg Chest Port 1 View  Result Date: 03/21/2017 CLINICAL DATA:  Respiratory failure. EXAM: PORTABLE CHEST 1 VIEW COMPARISON:  03/20/2017. FINDINGS: Right IJ line in stable position. Cardiac pacer with lead tips in right atrium and right ventricle. Cardiomegaly. Normal pulmonary vascularity. Low lung volumes with basilar atelectasis. Mild basilar infiltrates cannot be excluded . Small left pleural effusion cannot be excluded. No pneumothorax. Contrast noted in the stomach. Surgical clips noted over the left chest. Vascular stent noted over the right subclavian region. Prior cervical spine fusion . IMPRESSION: 1. Right IJ line stable position. 2. Stable cardiomegaly.  Cardiac pacer stable position. 3. Low lung volumes with basilar atelectasis. Mild left base infiltrate cannot be excluded. Small left pleural effusion cannot be excluded . Electronically Signed   By: [name]  Register   On: 03/21/2017 07:25   Dg Chest Port 1 View  Result Date: 03/20/2017 CLINICAL DATA:  Myasthenia gravis crisis. Atrial fibrillation, diabetes, end-stage dialysis dependent renal failure. Acute respiratory failure. EXAM: PORTABLE CHEST 1 VIEW COMPARISON:  Portable chest x-ray of March 19, 2017 FINDINGS: The trachea and esophagus have been extubated. The lungs are well-expanded. There is no focal infiltrate. The left lower lobe is less dense. There is no pleural effusion or pneumothorax. The cardiac silhouette remains enlarged. The pulmonary vascularity is not engorged. The ICD is in stable position. The right internal jugular venous catheter tip projects over the midportion of the SVC. There is a vascular graft in the distal aspect of the right subclavian region. The bony thorax exhibits no acute abnormality. IMPRESSION: Interval extubation of the trachea and esophagus. Clearing of left basilar atelectasis or pneumonia. Mild cardiomegaly without pulmonary edema. The support tubes and devices are in stable position.  Electronically Signed   By: David  Martinique M.D.   On: 03/20/2017 07:25   Dg Chest Port 1 View  Result Date: 03/19/2017 CLINICAL DATA:  Intubated.  Followup. EXAM: PORTABLE CHEST 1 VIEW COMPARISON:  03/18/2017 FINDINGS: Endotracheal tube tip is 6 cm above the carina. Nasogastric tube is present within the stomach. Pacemaker leads appear unchanged. Right internal jugular central line tip in the SVC 2 cm above the right atrium. Moderate atelectasis persists in the left lower lobe. Mild atelectasis persists in the right lower lobe. Question developing interstitial edema pattern. IMPRESSION: Lines and tubes satisfactory. Persistent lower lobe atelectasis left worse than right. Question developing edema pattern. Electronically Signed   By: Nelson Chimes M.D.   On: 03/19/2017 07:52   Dg Chest Port 1 View  Result Date: 03/18/2017 CLINICAL DATA:  Central line placement. EXAM: PORTABLE CHEST 1 VIEW COMPARISON:  03/18/2017 and prior studies FINDINGS: A right IJ central venous catheter is noted with tip overlying the lower SVC. There is no evidence of pneumothorax. An endotracheal tube with tip 5.2 cm above the carina, NG tube entering the stomach with tip off the field of view and left-sided pacemaker again noted. Bibasilar atelectasis, left-greater-than-right again noted. Mild pulmonary vascular congestion is present. A right subclavian stent is again identified. IMPRESSION: Right IJ central venous catheter with tip overlying the lower SVC.  No evidence of pneumothorax. Support apparatus as described. Bibasilar atelectasis and mild pulmonary vascular congestion. Electronically Signed   By: Margarette Canada M.D.   On: 03/18/2017 22:11   Dg Chest Port 1 View  Result Date: 03/18/2017 CLINICAL DATA:  81 year old male dialysis patient with worsening generalized weakness, recently diagnosed with myasthenia gravis, worsening respiratory difficulty. Intubated. Hyperreflexia. Fever. EXAM: PORTABLE CHEST 1 VIEW COMPARISON:  Chest  radiographs 03/17/2017 and earlier. Thoracic spine CT today reported separately. FINDINGS: Portable AP semi upright view at 1938 hours. Endotracheal tube tip projects over the tracheal air column at the level of the clavicles. Enteric tube courses the abdomen, tip not included. Mild veiling opacity at the left lung base. No pneumothorax or pulmonary edema. Stable cardiac size and mediastinal contours. Stable left chest dual lead cardiac pacemaker. Allowing portable technique the right lung is clear right subclavian vascular stent. Surgical clips along the left chest wall. ACDF hardware again noted. IMPRESSION: 1. Endotracheal tube tip in good position. Enteric tube courses to the abdomen. 2. Small left pleural effusion. No other acute cardiopulmonary abnormality. Electronically Signed   By: Genevie Ann M.D.   On: 03/18/2017 20:11   Dg Abd Portable 1v  Result Date: 03/18/2017 CLINICAL DATA:  81 year old male dialysis patient with worsening generalized weakness, recently diagnosed with myasthenia gravis, worsening respiratory difficulty. Intubated. Oral enteric tube. Hyperreflexia. Fever. EXAM: PORTABLE ABDOMEN - 1 VIEW COMPARISON:  Portable chest at 1938 hours. Thoracic spine CT today reported separately. FINDINGS: Portable AP supine view at 1948 hours. Enteric tube courses to the abdomen and the side hole projects at the mid gastric body level. Bowel gas pattern is within normal limits. Stable lung bases. No acute osseous abnormality identified. IMPRESSION: Enteric tube side hole up the level of the gastric body. Non-obstructed bowel gas pattern. Electronically Signed   By: Genevie Ann M.D.   On: 03/18/2017 20:13   Dg Swallowing Func-speech Pathology  Result Date: 03/20/2017 Objective Swallowing Evaluation: Type of Study: MBS-Modified Barium Swallow Study Patient Details Name: MARIO CORONADO MRN: [id number] Date of Birth: 03-11-1936 Today's Date: 03/20/2017 Time: SLP Start Time (ACUTE ONLY): 1409-SLP Stop Time (ACUTE  ONLY): 1425 SLP Time Calculation (min) (ACUTE ONLY): 16 min Past Medical History: Past Medical History: Diagnosis Date . Atrial fibrillation ([place])  . Chronic kidney disease, stage IV (severe) ([facility])   baseline creatinine 2-3 . DDD (degenerative disc disease)  . Diabetes mellitus without complication ([place])  . ESRD (end stage renal disease) on dialysis ([place])  . Hyperlipidemia  . Hypertension  . Melanoma ([place]) 2010  removed at [facility] . Second degree heart block   a. s/p STJ dual chamber PPM - Dr [name] . Stroke [facility]) 01/2017 Past Surgical History: Past Surgical History: Procedure Laterality Date . AV FISTULA PLACEMENT Right 05/31/2014  Procedure: Right Arm Brachiocephalic ARTERIOVENOUS FISTULA CREATION  ;  Surgeon: Conrad Oakes, MD;  Location: [place];  Service: Vascular;  Laterality: Right; . CATARACT EXTRACTION W/ INTRAOCULAR LENS  IMPLANT, BILATERAL   . COLONOSCOPY   . EXCISION MELANOMA WITH SENTINEL LYMPH NODE BIOPSY Right 02/08/2016  Procedure: WIDE EXCISION RIGHT SHOULDER MELANOMA WITH RIGHT SENTINEL LYMPH NODE BIOPSY;  Surgeon: [name], MD;  Location: [place];  Service: General;  Laterality: Right; . LAMINECTOMY   . PERMANENT PACEMAKER INSERTION N/A 01/20/2014  STJ Assurity dual chamber pacemaker implanted by Dr [name] for 2nd degree AV block HPI: Pt is a 81 y.o.maleadmitted for possible myasthenia gravis crisis with increasing LE weakness and difficulty swallowing,  although without coughing per H&P note. He had worsening respiratory status requiring intubation 7/3. New focal deficits inlcuding R sided weakness were noted on 7/4. CT perfusion study is concerning for an acute/subacute infarction at the pontomedullary junction in the midline. Pt self-extubated in the early morning 7/5. PMH includes ACDF (C5-C6), ESRD, DM, HLD, recent CVA, recently dx MG, second degree heart block, melanoma, DDD, a fib Subjective: pt alert, pleasant, needs cues, says he has trouble swallowing but has some difficulty clarifying  Assessment / Plan / Recommendation [facility] IP CLINICAL IMPRESSIONS 03/20/2017 Clinical Impression Pt presents with a mild oropharyngeal dysphagia that is likely multifactorial in nature, given neuro diagnoses (concern for acute MG flare and acute pontomedullary infarct), structural component (suspected osteophyte C4-C5 and cervical hardware C5-C6), and suspected esophageal issues. His oral phase is marked by mildly prolonged mastication and occasional premature spillage, particularly with solids and mixed consistencies, with material filling his vallecular space as he continues to masticate what remains orally. His swallow trigger is relatively timely for his age but with reduced hyolaryngeal elevation and particularly excursion. He has silent penetration during the swallow with thin liquids by straw that almost reaches his vocal folds, but this did clear well with a cued throat clear. Additional penetration occurred with thin liquids as he swallowed them with the barium tablet, but he spontaneously cleared his throat to expectorate it during that instance. No other airway compromise was observed, although he does have moderate residuals in the vallecula and pyriform sinuses that increases with thinner liquids that increases aspiration risk. He needs cues to attempt a second swallow which then reduces the residuals. Suspect that this is more esophageal in nature due to increased sub-UES pressure. Brief scan appeared to reveal barium remaining throughout the esophagus upon completion of testing. Recommend Dys 2 diet and thin liquids by cup with use of second swallows, esophageal precautions, and general aspiraiton precautions.  SLP Visit Diagnosis Dysphagia, oropharyngeal phase (R13.12);Dysphagia, pharyngoesophageal phase (R13.14) Attention and concentration deficit following -- Frontal lobe and executive function deficit following -- Impact on safety and function Mild aspiration risk;Moderate aspiration risk   CHL IP  TREATMENT RECOMMENDATION 03/20/2017 Treatment Recommendations Therapy as outlined in treatment plan below   Prognosis 03/20/2017 Prognosis for Safe Diet Advancement Good Barriers to Reach Goals Cognitive deficits Barriers/Prognosis Comment -- CHL IP DIET RECOMMENDATION 03/20/2017 SLP Diet Recommendations Dysphagia 2 (Fine chop) solids;Thin liquid Liquid Administration via Cup;No straw Medication Administration Whole meds with puree Compensations Minimize environmental distractions;Slow rate;Small sips/bites;Follow solids with liquid;Clear throat intermittently Postural Changes Remain semi-upright after after feeds/meals (Comment);Seated upright at 90 degrees   CHL IP OTHER RECOMMENDATIONS 03/20/2017 Recommended Consults -- Oral Care Recommendations Oral care BID Other Recommendations --   CHL IP FOLLOW UP RECOMMENDATIONS 03/20/2017 Follow up Recommendations Inpatient Rehab   CHL IP FREQUENCY AND DURATION 03/20/2017 Speech Therapy Frequency (ACUTE ONLY) min 2x/week Treatment Duration 2 weeks      CHL IP ORAL PHASE 03/20/2017 Oral Phase Impaired Oral - Pudding Teaspoon -- Oral - Pudding Cup -- Oral - Honey Teaspoon -- Oral - Honey Cup -- Oral - Nectar Teaspoon -- Oral - Nectar Cup -- Oral - Nectar Straw -- Oral - Thin Teaspoon -- Oral - Thin Cup WFL Oral - Thin Straw WFL Oral - Puree WFL Oral - Mech Soft Impaired mastication Oral - Regular -- Oral - Multi-Consistency Premature spillage Oral - Pill WFL Oral Phase - Comment --  CHL IP PHARYNGEAL PHASE 03/20/2017 Pharyngeal Phase Impaired Pharyngeal- Pudding Teaspoon --  Pharyngeal -- Pharyngeal- Pudding Cup -- Pharyngeal -- Pharyngeal- Honey Teaspoon -- Pharyngeal -- Pharyngeal- Honey Cup -- Pharyngeal -- Pharyngeal- Nectar Teaspoon -- Pharyngeal -- Pharyngeal- Nectar Cup -- Pharyngeal -- Pharyngeal- Nectar Straw -- Pharyngeal -- Pharyngeal- Thin Teaspoon -- Pharyngeal -- Pharyngeal- Thin Cup Reduced anterior laryngeal mobility;Reduced laryngeal elevation;Penetration/Aspiration during  swallow;Pharyngeal residue - valleculae;Pharyngeal residue - pyriform Pharyngeal Material enters airway, remains ABOVE vocal cords then ejected out Pharyngeal- Thin Straw Reduced anterior laryngeal mobility;Reduced laryngeal elevation;Penetration/Aspiration during swallow;Pharyngeal residue - valleculae;Pharyngeal residue - pyriform Pharyngeal Material enters airway, remains ABOVE vocal cords and not ejected out Pharyngeal- Puree Reduced anterior laryngeal mobility;Reduced laryngeal elevation;Pharyngeal residue - valleculae Pharyngeal -- Pharyngeal- Mechanical Soft Reduced anterior laryngeal mobility;Reduced laryngeal elevation;Pharyngeal residue - valleculae;Delayed swallow initiation-vallecula Pharyngeal -- Pharyngeal- Regular -- Pharyngeal -- Pharyngeal- Multi-consistency Reduced anterior laryngeal mobility;Reduced laryngeal elevation;Pharyngeal residue - valleculae Pharyngeal -- Pharyngeal- Pill Reduced anterior laryngeal mobility;Reduced laryngeal elevation Pharyngeal -- Pharyngeal Comment --  CHL IP CERVICAL ESOPHAGEAL PHASE 03/20/2017 Cervical Esophageal Phase Impaired Pudding Teaspoon -- Pudding Cup -- Honey Teaspoon -- Honey Cup -- Nectar Teaspoon -- Nectar Cup -- Nectar Straw -- Thin Teaspoon -- Thin Cup Reduced cricopharyngeal relaxation Thin Straw Reduced cricopharyngeal relaxation Puree Reduced cricopharyngeal relaxation Mechanical Soft Reduced cricopharyngeal relaxation Regular -- Multi-consistency -- Pill Reduced cricopharyngeal relaxation Cervical Esophageal Comment -- No flowsheet data found. Germain Osgood 03/20/2017, 3:36 PM  Germain Osgood, M.A. CCC-SLP [phone]              DISCHARGE EXAMINATION: Vitals:   03/26/17 0054 03/26/17 0500 03/26/17 0556 03/26/17 1032  BP: (!) 152/62  (!) 153/63 (!) 163/65  Pulse: 86  74 82  Resp: 18  18 18   Temp: 98.6 F (37 C)   97.6 F (36.4 C)  TempSrc: Oral   Oral  SpO2: 99%  99% 99%  Weight:  77.5 kg (170 lb 13.7 oz)    Height:        General appearance: alert, cooperative, appears stated age and no distress Resp: clear to auscultation bilaterally Cardio: regular rate and rhythm, S1, S2 normal, no murmur, click, rub or gallop GI: soft, non-tender; bowel sounds normal; no masses,  no organomegaly  DISPOSITION: SNF  Discharge Instructions    Call MD for:  extreme fatigue    Complete by:  As directed    Call MD for:  persistant dizziness or light-headedness    Complete by:  As directed    Call MD for:  persistant nausea and vomiting    Complete by:  As directed    Call MD for:  severe uncontrolled pain    Complete by:  As directed    Call MD for:  temperature >100.4    Complete by:  As directed    Discharge instructions    Complete by:  As directed    See instructions on the discharge summary  You were cared for by a hospitalist during your hospital stay. If you have any questions about your discharge medications or the care you received while you were in the hospital after you are discharged, you can call the unit and asked to speak with the hospitalist on call if the hospitalist that took care of you is not available. Once you are discharged, your primary care physician will handle any further medical issues. Please note that NO REFILLS for any discharge medications will be authorized once you are discharged, as it is imperative that you return to your primary care physician (or establish a relationship with  a primary care physician if you do not have one) for your aftercare needs so that they can reassess your need for medications and monitor your lab values. If you do not have a primary care physician, you can call [phone] for a physician referral.   Increase activity slowly    Complete by:  As directed       ALLERGIES:  Allergies  Allergen Reactions  . Amlodipine Besylate Swelling    Angioedema   . Lisinopril Other (See Comments)    Renal insufficiency  . Statins Other (See Comments)    Muscle aches  .  Ropinirole Other (See Comments)    Causes excessive grogginess the following morning after taking  . Tape Other (See Comments)    SKIN IS THIN AND TAPE WILL ACTUALLY TEAR IT; PLEASE USE COBAN WRAP INSTEAD!!     Current Discharge Medication List    START taking these medications   Details  Amino Acids-Protein Hydrolys (FEEDING SUPPLEMENT, PRO-STAT SUGAR FREE 64,) LIQD Take 30 mLs by mouth 2 (two) times daily. Qty: 900 mL, Refills: 0    ceFAZolin (ANCEF) 2-4 GM/100ML-% IVPB Inject 100 mLs (2 g total) into the vein every Monday, Wednesday, and Friday at 6 PM. Qty: 3 each, Refills: 0    famotidine (PEPCID) 40 MG/5ML suspension Take 2.5 mLs (20 mg total) by mouth at bedtime. Qty: 50 mL, Refills: 0    ipratropium-albuterol (DUONEB) 0.5-2.5 (3) MG/3ML SOLN Take 3 mLs by nebulization every 6 (six) hours as needed. Qty: 360 mL, Refills: 0    LORazepam (ATIVAN) 1 MG tablet Take 1 tablet (1 mg total) by mouth every 6 (six) hours as needed for anxiety. Qty: 30 tablet, Refills: 0    Nutritional Supplements (FEEDING SUPPLEMENT, NEPRO CARB STEADY,) LIQD Take 237 mLs by mouth 2 (two) times daily between meals. Refills: 0    predniSONE (DELTASONE) 20 MG tablet Take 1 tablet (20 mg total) by mouth daily with breakfast. Please note to continue taking 20 mg tablet for now  and you must be evaluated by your neurologist (Dr. [name]) to determine next Prednisone dose and frequency. Qty: 7 tablet, Refills: 1    traZODone (DESYREL) 50 MG tablet Take 1 tablet (50 mg total) by mouth at bedtime as needed for sleep.      CONTINUE these medications which have NOT CHANGED   Details  acetaminophen (TYLENOL) 500 MG tablet Take 500 mg by mouth every 8 (eight) hours as needed for mild pain. (pain/headaches)    aspirin EC 81 MG tablet Take 81 mg by mouth at bedtime.    carvedilol (COREG) 3.125 MG tablet Take 1 tablet (3.125 mg total) by mouth 2 (two) times daily with a meal. Qty: 60 tablet, Refills: 1      ezetimibe (ZETIA) 10 MG tablet Take 1 tablet (10 mg total) by mouth daily. Qty: 30 tablet, Refills: 1    fenofibrate 160 MG tablet Take 1 tablet (160 mg total) by mouth daily. Qty: 30 tablet, Refills: 0    finasteride (PROSCAR) 5 MG tablet Take 1 tablet (5 mg total) by mouth daily. Qty: 30 tablet, Refills: 1    gabapentin (NEURONTIN) 100 MG capsule Take 1 capsule (100 mg total) by mouth at bedtime. Qty: 30 capsule, Refills: 1    glimepiride (AMARYL) 1 MG tablet Take 1 tablet (1 mg total) by mouth daily with breakfast. Qty: 30 tablet, Refills: 1    Hypromellose (ARTIFICIAL TEARS OP) Place 1 drop into both eyes 3 (three) times  daily as needed (for dry eyes).     ipratropium (ATROVENT) 0.03 % nasal spray Place 2 sprays into both nostrils daily as needed for rhinitis.     warfarin (COUMADIN) 1 MG tablet 1 1/2 tab daily Qty: 30 tablet, Refills: 1    multivitamin (RENA-VIT) TABS tablet Take 1 tablet by mouth at bedtime. Qty: 30 tablet, Refills: 0      STOP taking these medications     calcium acetate (PHOSLO) 667 MG capsule      Multiple Vitamin (DAILY-VITE PO)      pyridostigmine (MESTINON) 60 MG tablet      rOPINIRole (REQUIP) 0.5 MG tablet      senna-docusate (SENOKOT-S) 8.6-50 MG tablet           Contact information for follow-up providers    [name], MD Follow up.   Specialty:  Family Medicine Contact information: [place] [address] [phone]        Britt Bottom, MD. Schedule an appointment as soon as possible for a visit.   Specialty:  Neurology Why:  in 1-2 weeks Contact information: [place] [address] [phone]            Contact information for after-discharge care    Destination    [facility] Follow up.   Specialty:  [place] information: [address] [place] [phone]                   TOTAL DISCHARGE TIME: 35 mins  [facility]  [facility] Pager [phone]  03/26/2017, 1:25 PM

## 2017-03-26 NOTE — Discharge Instructions (Signed)
Myasthenia Gravis  Myasthenia gravis (MG) means severe weakness. It is a long-term (chronic) condition that causes weakness in the muscles you can control (voluntary muscles). MG can affect any voluntary muscle. The muscles most often affected are the ones that control:   Eye movement.   Facial movements.   Swallowing.    MG is an autoimmune disease, which means that your body's defense system (immune system) attacks healthy parts of your body instead of germs and other things that make you sick. When you have MG, your immune system makes proteins (antibodies) that block the chemical (acetylcholine) your body needs to send nerve signals to your muscles. This causes muscle weakness.  What are the causes?  The exact cause of MG is unknown. One possible cause is an enlarged thymus gland, which is located under your breastbone.  What are the signs or symptoms?  The earliest symptom of MG is muscle weakness that gets worse with activity and gets better after rest. Other symptoms of MG may include:   Drooping eyelids.   Double vision.   Loss of facial expression.   Trouble chewing and swallowing.   Slurred speech.   A waddling walk.   Weakness of the arms, hands, and legs.    Trouble breathing is the most dangerous symptom of MG. Sudden and severe difficulty breathing (myasthenic crisis) may require emergency breathing support. This symptom sometimes happens after:   Infection.   Fever.   Drug reaction.    How is this diagnosed?  It can be hard to diagnose MG because muscle weakness is a common symptom in many conditions. Your health care provider will do a physical exam. You may also have tests that will help make a diagnosis. These may include:   A blood test.   A test using the medicine edrophonium. This medicine increases muscle strength by slowing the breakdown of acetylcholine.   Tests to measure nerve conduction to muscle (electromyography).   An imaging study of the chest (CT or MRI).    How is  this treated?  Treatment can improve muscle strength. Sometimes symptoms of MG go away for a while (remission) and you can stop treatment. Possible treatments include:   Medicine.   Removal of the thymus gland (thymectomy). This may result in a long remission for some people.    Follow these instructions at home:   Take medicines only as directed by your health care provider.   Get plenty of rest to conserve your energy.   Take frequent breaks to rest your eyes.   Maintain a healthy diet and a healthy weight.   Do not use any tobacco products including cigarettes, chewing tobacco, or electronic cigarettes. If you need help quitting, ask your health care provider.   Keep all follow-up visits as directed by your health care provider. This is important.  Contact a health care provider if:   Your symptoms get worse after a fever or infection.   You have a reaction to a medicine you are taking.   Your symptoms change or get worse.  Get help right away if:  You have trouble breathing.  This information is not intended to replace advice given to you by your health care provider. Make sure you discuss any questions you have with your health care provider.  Document Released: 12/09/2000 Document Revised: 02/08/2016 Document Reviewed: 11/03/2013  Elsevier Interactive Patient Education  2018 [employer] Inc[employer]

## 2017-03-26 NOTE — Progress Notes (Signed)
[facility] Progress Note   Subjective:   No c/o, feeling better overall.  Urinary / fecal incontinence have improved w / Rx.   Objective Vitals:   03/25/17 2229 03/26/17 0054 03/26/17 0500 03/26/17 0556  BP: (!) 163/66 (!) 152/62  (!) 153/63  Pulse: 87 86  74  Resp: _0 Temp:  98.6 F (37 C)    TempSrc:  Oral    SpO2: 98% 99%  99%  Weight:   77.5 kg (170 lb 13.7 oz)   Height:       Physical Exam General: NAD - breathing easily Heart: RRR Lungs: grossly clear Abdomen: soft NT + bs Extremities: no LE edema/SCDs in place Dialysis Access: right upper AVF + bruit Skin: several skin tears - skin very fragile  Dialysis: [facility] MWF 4h   80kg   2/2.5 bath  RUA AVF  Hep none  - hect 4 - venofer 50 6/25  - mircera 75 6/20 - just ^ to 100 for next dose Recent labs: hgb 8.9 INR ok 25% sat ferritin 135 May ipTH 291  Assessment/Plan:  1. Acute respiratory failure / myasthenia crisis / sepsis - . Self extubated. Stable since.  2. E Coli urosepsis (+ 1/2 Ecoli BC - Ecoli urine >100K colonies) On Rocephin ; WBC 13.3 >14.8   3. ESRD- MWF via AVF- Lower EDW at dc 4. Anemia- Fe def anemia- Dosed Aranesp 100 on 7/3. 1 unit PRBC's with HD 7/6 for Hb of 7.2 . Trending up 8.5 No Fe for now given bacteremia; redose ESA 7/11 5. Metabolic bone disease- hectorol IV with HD. No binders yet 6. Nutrition -- nepro/prostat DYS 2 diet intake variable alb 2.3 7. Supra therapeutic INR resolved- no heparin HD - INR elevated on admission required Vit K. 2.2 7/9 . Pharmacy dosing coumadin  8. Myasthenia gravis - sp IVIG x 5 days and now on prednisone 20/day. Improving.   Intolerant to mestinon.  9. Possible stroke - neuro not sure if artifact but feel "prudent to assume it is real, small enough that does not preclude anticoagulation 10. Frequent falls - started PTA. PT working with him. Unclear right now if will be SNF vs CIR.  11. DM- per primary  12. BP/volulme - net UF 1.5  7/6 - with post wt 78.5- losing weight - titrate volume for better BP control 13. Disp - for dc to SNF prob today   Kelly Splinter MD [facility] pager [phone]   03/26/2017, 9:18 AM  Additional Objective Labs: Basic Metabolic Panel:  Recent Labs Lab 03/24/17 0424 03/25/17 0447 03/26/17 0420  NA 134* 135 133*  K 4.0 3.5 3.8  CL 99* 97* 97*  CO2 _1 GLUCOSE 110* 107* 155*  BUN 95* 50* 81*  CREATININE 7.34* 4.55* 6.09*  CALCIUM 8.4* 8.6* 8.6*  PHOS 5.0* 4.7* 6.2*   Liver Function Tests:  Recent Labs Lab 03/24/17 0424 03/25/17 0447 03/26/17 0420  ALBUMIN 2.3* 2.5* 2.5*   CBC:  Recent Labs Lab 03/22/17 0533 03/23/17 0404 03/24/17 0424 03/25/17 0447 03/26/17 0420  WBC 13.3* 14.8* 15.9* 14.3* 10.3  HGB 8.6* 8.7* 8.5* 8.9* 8.4*  HCT 27.0* 26.6* 26.4* 28.1* 26.0*  MCV 94.7 94.0 94.3 95.3 94.9  PLT 232 238 258 238 229   Blood Culture    Component Value Date/Time   SDES BLOOD LEFT HAND 03/24/2017 0806   SPECREQUEST  03/24/2017 0806    BOTTLES DRAWN AEROBIC ONLY Blood Culture  adequate volume   CULT NO GROWTH 1 DAY 03/24/2017 0806   REPTSTATUS PENDING 03/24/2017 0806    CBG:  Recent Labs Lab 03/25/17 0722 03/25/17 1134 03/25/17 1710 03/25/17 2006 03/26/17 0636  GLUCAP 105* 193* 252* 293* 136*    Lab Results  Component Value Date   INR 1.61 03/26/2017   INR 1.82 03/25/2017   INR 2.20 03/24/2017   Studies/Results: No results found. Medications: . sodium chloride    .  ceFAZolin (ANCEF) IV     . aspirin  325 mg Oral Daily  . carvedilol  3.125 mg Oral BID WC  . chlorhexidine gluconate (MEDLINE KIT)  15 mL Mouth Rinse BID  . Chlorhexidine Gluconate Cloth  6 each Topical Daily  . darbepoetin (ARANESP) injection - DIALYSIS  100 mcg Intravenous Q Wed-HD  . doxercalciferol  4 mcg Intravenous Q M,W,F-HD  . famotidine  20 mg Oral QHS  . feeding supplement (NEPRO CARB STEADY)  237 mL Oral BID BM  . feeding supplement (PRO-STAT  SUGAR FREE 64)  30 mL Oral BID  . gabapentin  100 mg Oral TID  . insulin aspart  0-9 Units Subcutaneous TID AC & HS  . mouth rinse  15 mL Mouth Rinse QID  . multivitamin  1 tablet Oral QHS  . predniSONE  20 mg Oral Q breakfast  . sodium chloride flush  10-40 mL Intracatheter Q12H  . Warfarin - Pharmacist Dosing Inpatient   Does not apply [phone]

## 2017-03-26 NOTE — Progress Notes (Signed)
Pt being discharged to SNF per orders from MD. Pt and spouse educated and aware of discharge instructions. Pt and spouse verbalized understanding of plan. Pt's IJ was removed prior to discharge. Pt exiting hospital via stretcher with PTAR. RN will call and give report.

## 2017-03-26 NOTE — Progress Notes (Signed)
[facility] Progress Note   Subjective:   No c/o  Objective Vitals:   03/25/17 2229 03/26/17 0054 03/26/17 0500 03/26/17 0556  BP: (!) 163/66 (!) 152/62  (!) 153/63  Pulse: 87 86  74  Resp: _0 Temp:  98.6 F (37 C)    TempSrc:  Oral    SpO2: 98% 99%  99%  Weight:   77.5 kg (170 lb 13.7 oz)   Height:       Physical Exam General: NAD - breathing easily Heart: RRR Lungs: grossly clear Abdomen: soft NT + bs Extremities: no LE edema/SCDs in place Dialysis Access: right upper AVF + bruit Skin: several skin tears - skin very fragile  Dialysis: [facility] MWF 4h   80kg   2/2.5 bath  RUA AVF  Hep none  - hect 4 - venofer 50 6/25  - mircera 75 6/20 - just ^ to 100 for next dose Recent labs: hgb 8.9 INR ok 25% sat ferritin 135 May ipTH 291  Assessment/Plan: 1. Acute respiratory failure / myasthenia crisis / sepsis - . Self extubated. Stable since.  2. E Coli urosepsis (+ 1/2 Ecoli BC - Ecoli urine >100K colonies) On Rocephin ; WBC 13.3 >14.8   3. ESRD- MWF via AVF- Lower EDW at dc 4. Anemia- Fe def anemia- Dosed Aranesp 100 on 7/3. 1 unit PRBC's with HD 7/6 for Hb of 7.2 . Trending up 8.5 No Fe for now given bacteremia; redose ESA 7/11 5. Metabolic bone disease- hectorol IV with HD. No binders yet 6. Nutrition -- nepro/prostat DYS 2 diet intake variable alb 2.3 7. Supra therapeutic INR resolved- no heparin HD - INR elevated on admission required Vit K. 2.2 7/9 . Pharmacy dosing coumadin  8. Myasthenia gravis - converted to prednisone 20/day 7/9  Intolerant to mestinon.  9. Possible stroke - neuro not sure if artifact but feel "prudent to assume it is real, small enough that does not preclude anticoagulation 10. Frequent falls - started PTA. PT working with him. Unclear right now if will be SNF vs CIR.  11. DM- per primary  12. BP/volulme - net UF 1.5 7/6 - with post wt 78.5- losing weight - titrate volume for better BP control 13. Disp - being  evaluated for rehab- too unstable physically to go home.   Kelly Splinter MD [facility] pager [phone]   03/26/2017, 9:18 AM  Additional Objective Labs: Basic Metabolic Panel:  Recent Labs Lab 03/24/17 0424 03/25/17 0447 03/26/17 0420  NA 134* 135 133*  K 4.0 3.5 3.8  CL 99* 97* 97*  CO2 _1 GLUCOSE 110* 107* 155*  BUN 95* 50* 81*  CREATININE 7.34* 4.55* 6.09*  CALCIUM 8.4* 8.6* 8.6*  PHOS 5.0* 4.7* 6.2*   Liver Function Tests:  Recent Labs Lab 03/24/17 0424 03/25/17 0447 03/26/17 0420  ALBUMIN 2.3* 2.5* 2.5*   CBC:  Recent Labs Lab 03/22/17 0533 03/23/17 0404 03/24/17 0424 03/25/17 0447 03/26/17 0420  WBC 13.3* 14.8* 15.9* 14.3* 10.3  HGB 8.6* 8.7* 8.5* 8.9* 8.4*  HCT 27.0* 26.6* 26.4* 28.1* 26.0*  MCV 94.7 94.0 94.3 95.3 94.9  PLT 232 238 258 238 229   Blood Culture    Component Value Date/Time   SDES BLOOD LEFT HAND 03/24/2017 0806   SPECREQUEST  03/24/2017 0806    BOTTLES DRAWN AEROBIC ONLY Blood Culture adequate volume   CULT NO GROWTH 1 DAY 03/24/2017 0806   REPTSTATUS PENDING 03/24/2017 0806  CBG:  Recent Labs Lab 03/25/17 0722 03/25/17 1134 03/25/17 1710 03/25/17 2006 03/26/17 0636  GLUCAP 105* 193* 252* 293* 136*    Lab Results  Component Value Date   INR 1.61 03/26/2017   INR 1.82 03/25/2017   INR 2.20 03/24/2017   Studies/Results: No results found. Medications: . sodium chloride    .  ceFAZolin (ANCEF) IV     . aspirin  325 mg Oral Daily  . carvedilol  3.125 mg Oral BID WC  . chlorhexidine gluconate (MEDLINE KIT)  15 mL Mouth Rinse BID  . Chlorhexidine Gluconate Cloth  6 each Topical Daily  . darbepoetin (ARANESP) injection - DIALYSIS  100 mcg Intravenous Q Wed-HD  . doxercalciferol  4 mcg Intravenous Q M,W,F-HD  . famotidine  20 mg Oral QHS  . feeding supplement (NEPRO CARB STEADY)  237 mL Oral BID BM  . feeding supplement (PRO-STAT SUGAR FREE 64)  30 mL Oral BID  . gabapentin  100 mg  Oral TID  . insulin aspart  0-9 Units Subcutaneous TID AC & HS  . mouth rinse  15 mL Mouth Rinse QID  . multivitamin  1 tablet Oral QHS  . predniSONE  20 mg Oral Q breakfast  . sodium chloride flush  10-40 mL Intracatheter Q12H  . Warfarin - Pharmacist Dosing Inpatient   Does not apply [phone]

## 2017-03-26 NOTE — Progress Notes (Signed)
PT Cancellation Note  Patient Details Name: James David MRN: [id number] DOB: Feb 03, 1936   Cancelled Treatment:    Reason Eval/Treat Not Completed: Patient at procedure or test/unavailable  Rayford Halsted, SPTA Office [phone]  03/26/2017, 1:47 PM

## 2017-03-26 NOTE — Progress Notes (Signed)
VC 1.7L NIF -60 with good patient effort. Patient was in sitting position.  Bilateral BS clear. VS stable. Will continue to monitor.

## 2017-03-26 NOTE — Progress Notes (Signed)
OT Cancellation Note  Patient Details Name: James David MRN: [id number] DOB: Oct 07, 1935   Cancelled Treatment:    Reason Eval/Treat Not Completed: Patient at procedure or test/ unavailable  [place], OT/L  [phone] 03/26/2017 03/26/2017, 1:24 PM

## 2017-03-27 ENCOUNTER — Inpatient Hospital Stay: Payer: Medicare Other | Admitting: Physical Medicine & Rehabilitation

## 2017-03-29 LAB — CULTURE, BLOOD (ROUTINE X 2)
CULTURE: NO GROWTH
CULTURE: NO GROWTH
SPECIAL REQUESTS: ADEQUATE
Special Requests: ADEQUATE

## 2017-03-31 ENCOUNTER — Encounter: Payer: Medicare Other | Admitting: *Deleted

## 2017-04-08 ENCOUNTER — Telehealth: Payer: Self-pay | Admitting: Neurology

## 2017-04-08 NOTE — Telephone Encounter (Signed)
I have spoken with James David this afternoon. She sts. pt. was sent directly to a SNF from the hospital.  Meds have apparently been changed and she doesn't understand why. RAS saw pt. once in June.  I have advised [name] speak with the physician at the SNF to get clarity on meds.  She verbalized understanding of same/fim

## 2017-04-08 NOTE — Telephone Encounter (Signed)
Pt wife calling re: pt being diagnosed with myasthenia gravis.  She states he is now in a skilled nursing facility and is not on any medication for this.  Pt wife feels he should be, she would like a call back to discuss

## 2017-05-01 ENCOUNTER — Ambulatory Visit: Payer: Medicare Other | Admitting: Interventional Cardiology

## 2017-05-06 ENCOUNTER — Ambulatory Visit: Payer: Medicare Other | Admitting: Interventional Cardiology

## 2017-05-07 NOTE — Progress Notes (Deleted)
[facility]  PATIENT: James David DOB: 1936-08-22   REASON FOR VISIT: *** HISTORY FROM:    HISTORY OF PRESENT ILLNESS: ***  REVIEW OF SYSTEMS: Full 14 system review of systems performed and notable only for those listed, all others are neg:  Constitutional: neg  Cardiovascular: neg Ear/Nose/Throat: neg  Skin: neg Eyes: neg Respiratory: neg Gastroitestinal: neg  Hematology/Lymphatic: neg  Endocrine: neg Musculoskeletal:neg Allergy/Immunology: neg Neurological: neg Psychiatric: neg Sleep : neg   ALLERGIES: Allergies  Allergen Reactions  . Amlodipine Besylate Swelling    Angioedema   . Lisinopril Other (See Comments)    Renal insufficiency  . Statins Other (See Comments)    Muscle aches  . Ropinirole Other (See Comments)    Causes excessive grogginess the following morning after taking  . Tape Other (See Comments)    SKIN IS THIN AND TAPE WILL ACTUALLY TEAR IT; PLEASE USE COBAN WRAP INSTEAD!!    HOME MEDICATIONS: Outpatient Medications Prior to Visit  Medication Sig Dispense Refill  . acetaminophen (TYLENOL) 500 MG tablet Take 500 mg by mouth every 8 (eight) hours as needed for mild pain. (pain/headaches)    . Amino Acids-Protein Hydrolys (FEEDING SUPPLEMENT, PRO-STAT SUGAR FREE 64,) LIQD Take 30 mLs by mouth 2 (two) times daily. 900 mL 0  . aspirin EC 81 MG tablet Take 81 mg by mouth at bedtime.    . carvedilol (COREG) 3.125 MG tablet Take 1 tablet (3.125 mg total) by mouth 2 (two) times daily with a meal. 60 tablet 1  . ezetimibe (ZETIA) 10 MG tablet Take 1 tablet (10 mg total) by mouth daily. 30 tablet 1  . famotidine (PEPCID) 40 MG/5ML suspension Take 2.5 mLs (20 mg total) by mouth at bedtime. 50 mL 0  . fenofibrate 160 MG tablet Take 1 tablet (160 mg total) by mouth daily. 30 tablet 0  . finasteride (PROSCAR) 5 MG tablet Take 1 tablet (5 mg total) by mouth daily. 30 tablet 1  . gabapentin (NEURONTIN) 100 MG capsule Take 1 capsule  (100 mg total) by mouth at bedtime. 30 capsule 1  . glimepiride (AMARYL) 1 MG tablet Take 1 tablet (1 mg total) by mouth daily with breakfast. 30 tablet 1  . Hypromellose (ARTIFICIAL TEARS OP) Place 1 drop into both eyes 3 (three) times daily as needed (for dry eyes).     Marland Kitchen ipratropium (ATROVENT) 0.03 % nasal spray Place 2 sprays into both nostrils daily as needed for rhinitis.     Marland Kitchen ipratropium-albuterol (DUONEB) 0.5-2.5 (3) MG/3ML SOLN Take 3 mLs by nebulization every 6 (six) hours as needed. 360 mL 0  . LORazepam (ATIVAN) 1 MG tablet Take 1 tablet (1 mg total) by mouth every 6 (six) hours as needed for anxiety. 30 tablet 0  . multivitamin (RENA-VIT) TABS tablet Take 1 tablet by mouth at bedtime. (Patient not taking: Reported on 03/17/2017) 30 tablet 0  . Nutritional Supplements (FEEDING SUPPLEMENT, NEPRO CARB STEADY,) LIQD Take 237 mLs by mouth 2 (two) times daily between meals.  0  . predniSONE (DELTASONE) 20 MG tablet Take 1 tablet (20 mg total) by mouth daily with breakfast. Please note to continue taking 20 mg tablet for now  and you must be evaluated by your neurologist (Dr. [name]) to determine next Prednisone dose and frequency. 7 tablet 1  . traZODone (DESYREL) 50 MG tablet Take 1 tablet (50 mg total) by mouth at bedtime as needed for sleep.    Marland Kitchen warfarin (COUMADIN) 1 MG tablet  1 1/2 tab daily (Patient taking differently: Take 1-1.5 mg by mouth See admin instructions. 1.5 mg with supper on Sun/Tues/Thurs/Sat and 1 mg on Mon/Wed/Fri) 30 tablet 1   No facility-administered medications prior to visit.     PAST MEDICAL HISTORY: Past Medical History:  Diagnosis Date  . Atrial fibrillation ([place])   . Chronic kidney disease, stage IV (severe) (H[facility])    baseline creatinine 2-3  . DDD (degenerative disc disease)   . Diabetes mellitus without complication ([place])   . ESRD (end stage renal disease) on dialysis ([place])   . Hyperlipidemia   . Hypertension   . Melanoma ([place]) 2010   removed at [facility]  .  Second degree heart block    a. s/p STJ dual chamber PPM - Dr [name]  . Stroke [facility]) 01/2017    PAST SURGICAL HISTORY: Past Surgical History:  Procedure Laterality Date  . AV FISTULA PLACEMENT Right 05/31/2014   Procedure: Right Arm Brachiocephalic ARTERIOVENOUS FISTULA CREATION  ;  Surgeon: Conrad Palo, MD;  Location: [facility];  Service: Vascular;  Laterality: Right;  . CATARACT EXTRACTION W/ INTRAOCULAR LENS  IMPLANT, BILATERAL    . COLONOSCOPY    . EXCISION MELANOMA WITH SENTINEL LYMPH NODE BIOPSY Right 02/08/2016   Procedure: WIDE EXCISION RIGHT SHOULDER MELANOMA WITH RIGHT SENTINEL LYMPH NODE BIOPSY;  Surgeon: [name], MD;  Location: [facility];  Service: General;  Laterality: Right;  . LAMINECTOMY    . PERMANENT PACEMAKER INSERTION N/A 01/20/2014   STJ Assurity dual chamber pacemaker implanted by Dr [name] for 2nd degree AV block    FAMILY HISTORY: Family History  Problem Relation Age of Onset  . Pulmonary embolism Mother   . Diabetes Mother   . Heart attack Father   . Diabetes Brother     SOCIAL HISTORY: Social History   Social History  . Marital status: Married    Spouse name: [name]  . Number of children: N/A  . Years of education: N/A   Occupational History  .      retired   Social History Main Topics  . Smoking status: Never Smoker  . Smokeless tobacco: Never Used  . Alcohol use No  . Drug use: No  . Sexual activity: No   Other Topics Concern  . Not on file   Social History Narrative   Lives in [place] with spouse.  Retired Theatre manager for [employer].     PHYSICAL EXAM  There were no vitals filed for this visit. There is no height or weight on file to calculate BMI.  Generalized: Well developed, in no acute distress  Head: normocephalic and atraumatic,. Oropharynx benign  Neck: Supple, no carotid bruits  Cardiac: Regular rate rhythm, no murmur  Musculoskeletal: No deformity   Neurological examination   Mentation: Alert oriented to  time, place, history taking. Attention span and concentration appropriate. Recent and remote memory intact.  Follows all commands speech and language fluent.   Cranial nerve II-XII: Fundoscopic exam reveals sharp disc margins.Pupils were equal round reactive to light extraocular movements were full, visual field were full on confrontational test. Facial sensation and strength were normal. hearing was intact to finger rubbing bilaterally. Uvula tongue midline. head turning and shoulder shrug were normal and symmetric.Tongue protrusion into cheek strength was normal. Motor: normal bulk and tone, full strength in the BUE, BLE, fine finger movements normal, no pronator drift. No focal weakness Sensory: normal and symmetric to light touch, pinprick, and  Vibration, proprioception  Coordination: finger-nose-finger, heel-to-shin  bilaterally, no dysmetria Reflexes: Brachioradialis 2/2, biceps 2/2, triceps 2/2, patellar 2/2, Achilles 2/2, plantar responses were flexor bilaterally. Gait and Station: Rising up from seated position without assistance, normal stance,  moderate stride, good arm swing, smooth turning, able to perform tiptoe, and heel walking without difficulty. Tandem gait is steady  DIAGNOSTIC DATA (LABS, IMAGING, TESTING) - I reviewed patient records, labs, notes, testing and imaging myself where available.  Lab Results  Component Value Date   WBC 10.3 03/26/2017   HGB 8.4 (L) 03/26/2017   HCT 26.0 (L) 03/26/2017   MCV 94.9 03/26/2017   PLT 229 03/26/2017      Component Value Date/Time   NA 133 (L) 03/26/2017 0420   NA 140 02/28/2017 0916   NA 138 10/17/2016 1303   K 3.8 03/26/2017 0420   K 5.1 10/17/2016 1303   CL 97 (L) 03/26/2017 0420   CO2 26 03/26/2017 0420   CO2 18 (L) 10/17/2016 1303   GLUCOSE 155 (H) 03/26/2017 0420   GLUCOSE 197 (H) 10/17/2016 1303   BUN 81 (H) 03/26/2017 0420   BUN 46 (H) 02/28/2017 0916   BUN 71.0 (H) 10/17/2016 1303   CREATININE 6.09 (H) 03/26/2017  0420   CREATININE 5.9 (HH) 10/17/2016 1303   CALCIUM 8.6 (L) 03/26/2017 0420   CALCIUM 8.9 10/17/2016 1303   PROT 5.9 (L) 03/18/2017 0245   PROT 5.0 (L) 02/28/2017 0916   PROT 6.2 (L) 10/17/2016 1303   ALBUMIN 2.5 (L) 03/26/2017 0420   ALBUMIN 3.2 (L) 02/28/2017 0916   ALBUMIN 2.9 (L) 10/17/2016 1303   AST 12 (L) 03/18/2017 0245   AST 8 10/17/2016 1303   ALT 14 (L) 03/18/2017 0245   ALT 13 10/17/2016 1303   ALKPHOS 33 (L) 03/18/2017 0245   ALKPHOS 95 10/17/2016 1303   BILITOT 0.7 03/18/2017 0245   BILITOT 0.2 02/28/2017 0916   BILITOT 0.24 10/17/2016 1303   GFRNONAA 8 (L) 03/26/2017 0420   GFRAA 9 (L) 03/26/2017 0420   Lab Results  Component Value Date   CHOL 107 03/20/2017   HDL 14 (L) 03/20/2017   LDLCALC 32 03/20/2017   TRIG 306 (H) 03/20/2017   CHOLHDL 7.6 03/20/2017   Lab Results  Component Value Date   HGBA1C 5.7 (H) 03/20/2017   Lab Results  Component Value Date   VITAMINB12 414 03/18/2017   Lab Results  Component Value Date   TSH 0.748 02/28/2017    ***  ASSESSMENT AND PLAN  81 y.o. year old male  has a past medical history of Atrial fibrillation ([place]); Chronic kidney disease, stage IV (severe) ([place]); DDD (degenerative disc disease); Diabetes mellitus without complication ([place]); ESRD (end stage renal disease) on dialysis ([place]); Hyperlipidemia; Hypertension; Melanoma ([place]) (2010); Second degree heart block; and Stroke ([place]) (01/2017). here with ***    Rayburn Ma, [facility], APRN  [facility] [address], [place], [place] [address] [phone]

## 2017-05-08 ENCOUNTER — Encounter: Payer: Self-pay | Admitting: Neurology

## 2017-05-08 ENCOUNTER — Ambulatory Visit (INDEPENDENT_AMBULATORY_CARE_PROVIDER_SITE_OTHER): Payer: Medicare Other | Admitting: Neurology

## 2017-05-08 ENCOUNTER — Ambulatory Visit: Payer: Medicare Other | Admitting: Nurse Practitioner

## 2017-05-08 VITALS — BP 101/64 | HR 56 | Resp 18 | Ht 70.0 in | Wt 176.0 lb

## 2017-05-08 DIAGNOSIS — G7000 Myasthenia gravis without (acute) exacerbation: Secondary | ICD-10-CM | POA: Diagnosis not present

## 2017-05-08 DIAGNOSIS — I679 Cerebrovascular disease, unspecified: Secondary | ICD-10-CM | POA: Diagnosis not present

## 2017-05-08 DIAGNOSIS — R269 Unspecified abnormalities of gait and mobility: Secondary | ICD-10-CM | POA: Diagnosis not present

## 2017-05-08 DIAGNOSIS — I69398 Other sequelae of cerebral infarction: Secondary | ICD-10-CM | POA: Diagnosis not present

## 2017-05-08 DIAGNOSIS — N186 End stage renal disease: Secondary | ICD-10-CM | POA: Diagnosis not present

## 2017-05-08 DIAGNOSIS — I441 Atrioventricular block, second degree: Secondary | ICD-10-CM

## 2017-05-08 DIAGNOSIS — Z992 Dependence on renal dialysis: Secondary | ICD-10-CM

## 2017-05-08 NOTE — Progress Notes (Addendum)
[facility]  PATIENT: James David DOB: 12-06-1935     HISTORICAL  CHIEF COMPLAINT:  Chief Complaint  Patient presents with  . Hospitalization Follow-up    Was unable to tolerate Mestinon due to diarrhea. On 03/17/17, was admitted to [facility] with progressive weakness. Experienced resp. failure while there.  Worsening renal function, so is now on dialysis.  On oral steroids.  Living in a rehab ctr. now ([facility] in [place], phone# [phone]))/fim  . Myasthenia Gravis    HISTORY James David is a 81 year old man with myasthenia gravis (positive antibodies) who had a small stroke with mild left-sided weakness 4 months ago.   I saw him shortly thereafter and noted generalized weakness leading to the evaluation for myasthenia gravis.  He was recently hospitalized (03/19/2017)  for respiratory failure and gram-negative sepsis.   The myasthenia gravis was likely playing a role in the respiratory failure.  to be due to the myasthenia gravis..  I reviewed the CT scan of the head showing moderately severe chronic vascular ischemic changes, chronic left caudate and bilateral internal capsules lacunar infarctions and moderate atrophy.  He received 2 g IVIg and was started on prednisone.   He is still on prednisone 20 mg and he tolerates it well.     He feels stronger than last month and canuse a walker to go close to 50 feet. He can walk with a cane. Feels much less fatigue that he did several months ago.     He feels less sleepy.      He also has chronic renal insufficiency.   His renal doctor is [name]) and he was seeing Dr. Justin Mend.   Dialysis was started recently.  We discussed that his current difficulties with gait are likely a combination of the myasthenia gravis, history of stroke, history of other chronic ischemic changes noted on the imaging studies, general health with multiple chronic  illnesses.   CTA 02/04/2017 was personally reviewed and I concur with Impression below: IMPRESSION: 1. Severely depressed flow in the right V4 segment likely due to stenosis at the dura. Small basilar in the setting of fetal type left PCA, with superimposed moderate to advanced mid basilar stenosis. 2. Bilateral intracranial ICA stenosis that is moderate to advanced. 3. Advanced diffuse atherosclerotic irregularity of medium size intracranial vessels. 4. Severe right and moderate left vertebral origin stenosis. 5. No flow limiting stenosis in the atherosclerotic cervical carotid circulation. 6. 14 mm mass in the right tracheoesophageal groove with features of parathyroid adenoma. Correlate with this calcium labs. 7. Bilateral thyroid nodules measuring up to 25 mm on the right  From Dr. Phoebe Sharps hospital note 01/31/17: HISTORY OF PRESENT ILLNESS (per record) This is a 65-yo RH man who presented to the ED after he had an episode of L arm numbness and tingling yesterday 01/29/2017. He was at [facility] when he noted that his left arm felt numb. This was preceded by sweating and dizziness. He states the has also had some slurred speech. His numbness resolved after about 15 minutes but he feels like his speech remains slightly more slurred than usual. He denies any facial droop or symptoms in the LLE. In the ED he had a CTH which showed no acute abnormality. His pacemaker was interrogated and showed no arryhythmia or other cardiac irregularity that correlated with his symptoms. He was admitted for further evaluation.   He states that last night he was trying to walk  to the bathroom in his room but had difficulty because his left leg seemed to give out from under him. He does not feel any weakness in his leg right now.   Of note, the patient started hemodialysis on 5/15. He has a pacemaker in place due to a diagnosis of second degree heart block. He has a known h/o atrial fibrillation but is not  anticoagulated. He denies any prior history of stroke.   Patient was not administered IV t-PA secondary to delay in arrival. He was admitted for further evaluation and treatment.   SUBJECTIVE (INTERVAL HISTORY) James David lying in bed and wife is at bedside. James David and wife recounted HPI with me. He was in restaurant and had sudden onset left arm numbness and tingling, at the same time, he had slurry speech and felt dizzy and nauseous, not able to stand up. Called EMS, and on their arrival, BP 130s. The numbness and tingling lasted about 35min but slurry speech continued today. Wife felt his speech still not baseline but James David felt pretty good about himself. CT negative x 2. He has pacemaker due to AVB II but also found to have Aflutter. Not on AC. He is taking ASA 81mg  at home. Recently started HD for CKD, had some problem with fistular and thrombosis, following with Dr. Posey Pronto at [facility] and vascular center.    ROS:  Out of a complete 14 system review of symptoms, the patient complains only of the following symptoms, and all other reviewed systems are negative.  As above and he has fatigue. He has lower back pain. Mild neck pain.   ALLERGIES: Allergies  Allergen Reactions  . Amlodipine Besylate Swelling    Angioedema   . Lisinopril Other (See Comments)    Renal insufficiency  . Statins Other (See Comments)    Muscle aches  . Ropinirole Other (See Comments)    Causes excessive grogginess the following morning after taking  . Tape Other (See Comments)    SKIN IS THIN AND TAPE WILL ACTUALLY TEAR IT; PLEASE USE COBAN WRAP INSTEAD!!    HOME MEDICATIONS:  Current Outpatient Prescriptions:  .  acetaminophen (TYLENOL) 500 MG tablet, Take 500 mg by mouth every 8 (eight) hours as needed for mild pain. (pain/headaches), Disp: , Rfl:  .  Amino Acids-Protein Hydrolys (FEEDING SUPPLEMENT, PRO-STAT SUGAR FREE 64,) LIQD, Take 30 mLs by mouth 2 (two) times daily., Disp: 900 mL, Rfl: 0 .  aspirin  EC 81 MG tablet, Take 81 mg by mouth at bedtime., Disp: , Rfl:  .  calcium acetate (PHOSLO) 667 MG capsule, , Disp: , Rfl:  .  carvedilol (COREG) 3.125 MG tablet, Take 1 tablet (3.125 mg total) by mouth 2 (two) times daily with a meal., Disp: 60 tablet, Rfl: 1 .  ezetimibe (ZETIA) 10 MG tablet, Take 1 tablet (10 mg total) by mouth daily., Disp: 30 tablet, Rfl: 1 .  famotidine (PEPCID) 40 MG/5ML suspension, Take 2.5 mLs (20 mg total) by mouth at bedtime., Disp: 50 mL, Rfl: 0 .  fenofibrate 160 MG tablet, Take 1 tablet (160 mg total) by mouth daily., Disp: 30 tablet, Rfl: 0 .  finasteride (PROSCAR) 5 MG tablet, Take 1 tablet (5 mg total) by mouth daily., Disp: 30 tablet, Rfl: 1 .  gabapentin (NEURONTIN) 100 MG capsule, Take 1 capsule (100 mg total) by mouth at bedtime., Disp: 30 capsule, Rfl: 1 .  glimepiride (AMARYL) 1 MG tablet, Take 1 tablet (1 mg total) by mouth daily with breakfast., Disp:  30 tablet, Rfl: 1 .  Hypromellose (ARTIFICIAL TEARS OP), Place 1 drop into both eyes 3 (three) times daily as needed (for dry eyes). , Disp: , Rfl:  .  ipratropium (ATROVENT) 0.03 % nasal spray, Place 2 sprays into both nostrils daily as needed for rhinitis. , Disp: , Rfl:  .  ipratropium-albuterol (DUONEB) 0.5-2.5 (3) MG/3ML SOLN, Take 3 mLs by nebulization every 6 (six) hours as needed., Disp: 360 mL, Rfl: 0 .  LORazepam (ATIVAN) 1 MG tablet, Take 1 tablet (1 mg total) by mouth every 6 (six) hours as needed for anxiety., Disp: 30 tablet, Rfl: 0 .  multivitamin (RENA-VIT) TABS tablet, Take 1 tablet by mouth at bedtime., Disp: 30 tablet, Rfl: 0 .  Nutritional Supplements (FEEDING SUPPLEMENT, NEPRO CARB STEADY,) LIQD, Take 237 mLs by mouth 2 (two) times daily between meals., Disp: , Rfl: 0 .  predniSONE (DELTASONE) 20 MG tablet, Take 1 tablet (20 mg total) by mouth daily with breakfast. Please note to continue taking 20 mg tablet for now  and you must be evaluated by your neurologist (Dr. [name]) to determine next  Prednisone dose and frequency., Disp: 7 tablet, Rfl: 1 .  traZODone (DESYREL) 50 MG tablet, Take 1 tablet (50 mg total) by mouth at bedtime as needed for sleep., Disp: , Rfl:  .  warfarin (COUMADIN) 1 MG tablet, 1 1/2 tab daily (Patient taking differently: Take 1-1.5 mg by mouth See admin instructions. 1.5 mg with supper on Sun/Tues/Thurs/Sat and 1 mg on Mon/Wed/Fri), Disp: 30 tablet, Rfl: 1 .  warfarin (COUMADIN) 3 MG tablet, Take 3 mg by mouth daily., Disp: , Rfl:   PAST MEDICAL HISTORY: Past Medical History:  Diagnosis Date  . Atrial fibrillation ([place])   . Chronic kidney disease, stage IV (severe) (HCC)    baseline creatinine 2-3  . DDD (degenerative disc disease)   . Diabetes mellitus without complication ([place])   . ESRD (end stage renal disease) on dialysis ([place])   . Hyperlipidemia   . Hypertension   . Melanoma ([place]) 2010   removed at [facility]  . Second degree heart block    a. s/p STJ dual chamber PPM - Dr [name]  . Stroke [facility]) 01/2017    PAST SURGICAL HISTORY: Past Surgical History:  Procedure Laterality Date  . AV FISTULA PLACEMENT Right 05/31/2014   Procedure: Right Arm Brachiocephalic ARTERIOVENOUS FISTULA CREATION  ;  Surgeon: Conrad Jenner, MD;  Location: [place];  Service: Vascular;  Laterality: Right;  . CATARACT EXTRACTION W/ INTRAOCULAR LENS  IMPLANT, BILATERAL    . COLONOSCOPY    . EXCISION MELANOMA WITH SENTINEL LYMPH NODE BIOPSY Right 02/08/2016   Procedure: WIDE EXCISION RIGHT SHOULDER MELANOMA WITH RIGHT SENTINEL LYMPH NODE BIOPSY;  Surgeon: [name], MD;  Location: [place];  Service: General;  Laterality: Right;  . LAMINECTOMY    . PERMANENT PACEMAKER INSERTION N/A 01/20/2014   STJ Assurity dual chamber pacemaker implanted by Dr [name] for 2nd degree AV block    FAMILY HISTORY: Family History  Problem Relation Age of Onset  . Pulmonary embolism Mother   . Diabetes Mother   . Heart attack Father   . Diabetes Brother     SOCIAL HISTORY:  Social History    Social History  . Marital status: Married    Spouse name: [name]  . Number of children: N/A  . Years of education: N/A   Occupational History  .      retired   Social History Main Topics  .  Smoking status: Never Smoker  . Smokeless tobacco: Never Used  . Alcohol use No  . Drug use: No  . Sexual activity: No   Other Topics Concern  . Not on file   Social History Narrative   Lives in [place] with spouse.  Retired Theatre manager for [employer].     PHYSICAL EXAM  Vitals:   05/08/17 1315  BP: 101/64  Pulse: (!) 56  Resp: 18  Weight: 176 lb (79.8 kg)  Height: 5\' 10"  (1.778 m)    Body mass index is 25.25 kg/m.   General: The patient is well-developed and well-nourished and in no acute distress.  He is in a wheelchair.  Eyes:  Funduscopic exam shows normal optic discs.  Neck: No carotid bruits are noted.  The neck is nontender.   Mild reduced range of motion.  Cardiovascular:   The heart has regular rate and rhythm and he has a mild murmur. Peripheral pulses present. Lungs are clear.    Musculoskeletal:  He has mild lower lumbar paraspinal tenderness  Neurologic Exam  Mental status: The patient is alert and oriented x 3 at the time of the examination. The patient has apparent normal recent and remote memory, with an apparently normal attention span and concentration ability.   Speech is normal.  Cranial nerves: Extraocular movements are full. Facial strength and sensation is normal. No ptosis.. No dysarthria is noted.  The tongue is midline, and the patient has symmetric elevation of the soft palate. No obvious hearing deficits are noted.  Motor:  Muscle bulk is normal.   Tone is normal. Strength is  5 / 5 in all 4 extremities except 4+/5 in iliopsoas muscles of hip flexures and 4/5 toe extensors.   Sensory: He has reduced vibration sensation in the toes and ankles and normal sensation in the knees   Coordination: Cerebellar testing reveals good  finger-nose-finger and poor heel-to-shin bilaterally.  Gait and station: He is able to stand up independently but requires using both arms. According onto my hands she can take some steps but his legs buckle.    Reflexes: Deep tendon reflexes are symmetric and normal bilaterally.     DIAGNOSTIC DATA (LABS, IMAGING, TESTING) - I reviewed patient records, labs, notes, testing and imaging myself where available.  Lab Results  Component Value Date   WBC 10.3 03/26/2017   HGB 8.4 (L) 03/26/2017   HCT 26.0 (L) 03/26/2017   MCV 94.9 03/26/2017   PLT 229 03/26/2017      Component Value Date/Time   NA 133 (L) 03/26/2017 0420   NA 140 02/28/2017 0916   NA 138 10/17/2016 1303   K 3.8 03/26/2017 0420   K 5.1 10/17/2016 1303   CL 97 (L) 03/26/2017 0420   CO2 26 03/26/2017 0420   CO2 18 (L) 10/17/2016 1303   GLUCOSE 155 (H) 03/26/2017 0420   GLUCOSE 197 (H) 10/17/2016 1303   BUN 81 (H) 03/26/2017 0420   BUN 46 (H) 02/28/2017 0916   BUN 71.0 (H) 10/17/2016 1303   CREATININE 6.09 (H) 03/26/2017 0420   CREATININE 5.9 (HH) 10/17/2016 1303   CALCIUM 8.6 (L) 03/26/2017 0420   CALCIUM 8.9 10/17/2016 1303   PROT 5.9 (L) 03/18/2017 0245   PROT 5.0 (L) 02/28/2017 0916   PROT 6.2 (L) 10/17/2016 1303   ALBUMIN 2.5 (L) 03/26/2017 0420   ALBUMIN 3.2 (L) 02/28/2017 0916   ALBUMIN 2.9 (L) 10/17/2016 1303   AST 12 (L) 03/18/2017 0245   AST 8 10/17/2016  1303   ALT 14 (L) 03/18/2017 0245   ALT 13 10/17/2016 1303   ALKPHOS 33 (L) 03/18/2017 0245   ALKPHOS 95 10/17/2016 1303   BILITOT 0.7 03/18/2017 0245   BILITOT 0.2 02/28/2017 0916   BILITOT 0.24 10/17/2016 1303   GFRNONAA 8 (L) 03/26/2017 0420   GFRAA 9 (L) 03/26/2017 0420   Lab Results  Component Value Date   CHOL 107 03/20/2017   HDL 14 (L) 03/20/2017   LDLCALC 32 03/20/2017   TRIG 306 (H) 03/20/2017   CHOLHDL 7.6 03/20/2017   Lab Results  Component Value Date   HGBA1C 5.7 (H) 03/20/2017   Lab Results  Component Value Date    [id number] 829 03/18/2017   Lab Results  Component Value Date   TSH 0.748 02/28/2017       ASSESSMENT AND PLAN  Myasthenia gravis ([place])  ESRD on dialysis ([place])  Gait disturbance, post-stroke  Second degree heart block  Intracranial vascular stenosis   1.   Strength appears to be stable to last few weeks. From his initial IVIG a likely starting to wear off as it has been about 6 or 7 weeks. I will arrange for him to have another IVIG dose. In the meantime he will continue the prednisone 20 mg. 2.    Advised to stay active and use the walker when he ambulates. 3.    rtc 3 months or sooner if new or worsening symptoms.  45 minute face-to-face evaluation with greater than one half the time counseling and coordinating care about his multiple neurologic issues and poor ambulation.  Richard A. [name], MD, PhD, Charlynn Grimes 9/37/1696, 7:89 PM Certified in Neurology, Clinical Neurophysiology, Sleep Medicine, Pain Medicine and Neuroimaging  [facility] [address], [place], [place] [address] [phone]  ADDENDUM for clarification:   At this visit, Mr. Plotts has a modified Rankin scale score of +3.   The MRC scale is 4/5 in the proximal legs (iliopsoas) and foot and ankles bilaterally. The MRC is 5/5 elsewhere.   In the hospital/ED, before his IVIG, he was worse with a modified Rankin scale score of +4 and generalized weakness with MRC scores of 4-/5 in the arms and legs.  Richard A. [name], MD. PhD

## 2017-05-14 ENCOUNTER — Telehealth: Payer: Self-pay | Admitting: Neurology

## 2017-05-14 NOTE — Telephone Encounter (Signed)
IVIG orders given to [facility].  I have spoken with Mrs. Ollis and let her know we are working on getting pt's IVIG scheduled in our office/fim

## 2017-05-14 NOTE — Telephone Encounter (Signed)
I have spoken with James David this afternoon and explained pt's IVIG will be infused in our office once ins. has approved it/fim

## 2017-05-14 NOTE — Telephone Encounter (Signed)
Pt's wife called pt cannot have infusion at the dialysis center. She said this will need to be done at the hospital. She is wanting to know when this can be set up. Please call

## 2017-06-09 ENCOUNTER — Inpatient Hospital Stay (HOSPITAL_COMMUNITY)
Admission: EM | Admit: 2017-06-09 | Discharge: 2017-06-13 | DRG: 252 | Disposition: A | Discharge status: Home Health | Payer: Medicare Other | Attending: Family Medicine | Admitting: Family Medicine

## 2017-06-09 ENCOUNTER — Encounter (HOSPITAL_COMMUNITY): Payer: Self-pay

## 2017-06-09 DIAGNOSIS — I2510 Atherosclerotic heart disease of native coronary artery without angina pectoris: Secondary | ICD-10-CM | POA: Diagnosis present

## 2017-06-09 DIAGNOSIS — Z950 Presence of cardiac pacemaker: Secondary | ICD-10-CM

## 2017-06-09 DIAGNOSIS — E1121 Type 2 diabetes mellitus with diabetic nephropathy: Secondary | ICD-10-CM | POA: Diagnosis present

## 2017-06-09 DIAGNOSIS — E0821 Diabetes mellitus due to underlying condition with diabetic nephropathy: Secondary | ICD-10-CM | POA: Diagnosis present

## 2017-06-09 DIAGNOSIS — Y712 Prosthetic and other implants, materials and accessory cardiovascular devices associated with adverse incidents: Secondary | ICD-10-CM | POA: Diagnosis present

## 2017-06-09 DIAGNOSIS — E785 Hyperlipidemia, unspecified: Secondary | ICD-10-CM | POA: Diagnosis present

## 2017-06-09 DIAGNOSIS — Z7952 Long term (current) use of systemic steroids: Secondary | ICD-10-CM

## 2017-06-09 DIAGNOSIS — Z85820 Personal history of malignant melanoma of skin: Secondary | ICD-10-CM | POA: Diagnosis not present

## 2017-06-09 DIAGNOSIS — E1165 Type 2 diabetes mellitus with hyperglycemia: Secondary | ICD-10-CM | POA: Diagnosis present

## 2017-06-09 DIAGNOSIS — D62 Acute posthemorrhagic anemia: Secondary | ICD-10-CM | POA: Diagnosis present

## 2017-06-09 DIAGNOSIS — R791 Abnormal coagulation profile: Secondary | ICD-10-CM | POA: Diagnosis present

## 2017-06-09 DIAGNOSIS — I10 Essential (primary) hypertension: Secondary | ICD-10-CM | POA: Diagnosis not present

## 2017-06-09 DIAGNOSIS — T82590A Other mechanical complication of surgically created arteriovenous fistula, initial encounter: Principal | ICD-10-CM | POA: Diagnosis present

## 2017-06-09 DIAGNOSIS — L89512 Pressure ulcer of right ankle, stage 2: Secondary | ICD-10-CM | POA: Diagnosis present

## 2017-06-09 DIAGNOSIS — G7000 Myasthenia gravis without (acute) exacerbation: Secondary | ICD-10-CM | POA: Diagnosis present

## 2017-06-09 DIAGNOSIS — J449 Chronic obstructive pulmonary disease, unspecified: Secondary | ICD-10-CM | POA: Diagnosis present

## 2017-06-09 DIAGNOSIS — L7682 Other postprocedural complications of skin and subcutaneous tissue: Secondary | ICD-10-CM | POA: Diagnosis not present

## 2017-06-09 DIAGNOSIS — I480 Paroxysmal atrial fibrillation: Secondary | ICD-10-CM | POA: Diagnosis present

## 2017-06-09 DIAGNOSIS — E8889 Other specified metabolic disorders: Secondary | ICD-10-CM | POA: Diagnosis present

## 2017-06-09 DIAGNOSIS — N186 End stage renal disease: Secondary | ICD-10-CM | POA: Diagnosis not present

## 2017-06-09 DIAGNOSIS — I132 Hypertensive heart and chronic kidney disease with heart failure and with stage 5 chronic kidney disease, or end stage renal disease: Secondary | ICD-10-CM | POA: Diagnosis present

## 2017-06-09 DIAGNOSIS — N2581 Secondary hyperparathyroidism of renal origin: Secondary | ICD-10-CM | POA: Diagnosis present

## 2017-06-09 DIAGNOSIS — Z7982 Long term (current) use of aspirin: Secondary | ICD-10-CM

## 2017-06-09 DIAGNOSIS — I482 Chronic atrial fibrillation: Secondary | ICD-10-CM | POA: Diagnosis not present

## 2017-06-09 DIAGNOSIS — Z992 Dependence on renal dialysis: Secondary | ICD-10-CM

## 2017-06-09 DIAGNOSIS — R578 Other shock: Secondary | ICD-10-CM | POA: Diagnosis present

## 2017-06-09 DIAGNOSIS — Z79891 Long term (current) use of opiate analgesic: Secondary | ICD-10-CM | POA: Diagnosis not present

## 2017-06-09 DIAGNOSIS — Z888 Allergy status to other drugs, medicaments and biological substances status: Secondary | ICD-10-CM

## 2017-06-09 DIAGNOSIS — Z833 Family history of diabetes mellitus: Secondary | ICD-10-CM

## 2017-06-09 DIAGNOSIS — T82838A Hemorrhage of vascular prosthetic devices, implants and grafts, initial encounter: Secondary | ICD-10-CM | POA: Diagnosis not present

## 2017-06-09 DIAGNOSIS — Z8673 Personal history of transient ischemic attack (TIA), and cerebral infarction without residual deficits: Secondary | ICD-10-CM

## 2017-06-09 DIAGNOSIS — I5032 Chronic diastolic (congestive) heart failure: Secondary | ICD-10-CM | POA: Diagnosis present

## 2017-06-09 DIAGNOSIS — Z7901 Long term (current) use of anticoagulants: Secondary | ICD-10-CM | POA: Diagnosis not present

## 2017-06-09 DIAGNOSIS — Z91048 Other nonmedicinal substance allergy status: Secondary | ICD-10-CM

## 2017-06-09 DIAGNOSIS — Z79899 Other long term (current) drug therapy: Secondary | ICD-10-CM

## 2017-06-09 DIAGNOSIS — L89622 Pressure ulcer of left heel, stage 2: Secondary | ICD-10-CM | POA: Diagnosis present

## 2017-06-09 DIAGNOSIS — L8990 Pressure ulcer of unspecified site, unspecified stage: Secondary | ICD-10-CM | POA: Insufficient documentation

## 2017-06-09 DIAGNOSIS — I4821 Permanent atrial fibrillation: Secondary | ICD-10-CM | POA: Diagnosis present

## 2017-06-09 DIAGNOSIS — E1122 Type 2 diabetes mellitus with diabetic chronic kidney disease: Secondary | ICD-10-CM | POA: Diagnosis present

## 2017-06-09 DIAGNOSIS — Z8249 Family history of ischemic heart disease and other diseases of the circulatory system: Secondary | ICD-10-CM

## 2017-06-09 DIAGNOSIS — Z961 Presence of intraocular lens: Secondary | ICD-10-CM | POA: Diagnosis present

## 2017-06-09 DIAGNOSIS — T829XXA Unspecified complication of cardiac and vascular prosthetic device, implant and graft, initial encounter: Secondary | ICD-10-CM

## 2017-06-09 LAB — CBC WITH DIFFERENTIAL/PLATELET
BASOS ABS: 0 10*3/uL (ref 0.0–0.1)
BASOS PCT: 0 %
Eosinophils Absolute: 0 10*3/uL (ref 0.0–0.7)
Eosinophils Relative: 0 %
HEMATOCRIT: 36.6 % — AB (ref 39.0–52.0)
Hemoglobin: 11.5 g/dL — ABNORMAL LOW (ref 13.0–17.0)
LYMPHS PCT: 7 %
Lymphs Abs: 0.9 10*3/uL (ref 0.7–4.0)
MCH: 31.2 pg (ref 26.0–34.0)
MCHC: 31.4 g/dL (ref 30.0–36.0)
MCV: 99.2 fL (ref 78.0–100.0)
MONO ABS: 0.6 10*3/uL (ref 0.1–1.0)
Monocytes Relative: 5 %
NEUTROS ABS: 10.6 10*3/uL — AB (ref 1.7–7.7)
NEUTROS PCT: 88 %
Platelets: 385 10*3/uL (ref 150–400)
RBC: 3.69 MIL/uL — AB (ref 4.22–5.81)
RDW: 16.5 % — AB (ref 11.5–15.5)
WBC: 12.1 10*3/uL — AB (ref 4.0–10.5)

## 2017-06-09 LAB — I-STAT CHEM 8, ED
BUN: 100 mg/dL — ABNORMAL HIGH (ref 6–20)
CALCIUM ION: 1.13 mmol/L — AB (ref 1.15–1.40)
Chloride: 94 mmol/L — ABNORMAL LOW (ref 101–111)
Creatinine, Ser: 9.1 mg/dL — ABNORMAL HIGH (ref 0.61–1.24)
GLUCOSE: 533 mg/dL — AB (ref 65–99)
HEMATOCRIT: 38 % — AB (ref 39.0–52.0)
HEMOGLOBIN: 12.9 g/dL — AB (ref 13.0–17.0)
POTASSIUM: 5.1 mmol/L (ref 3.5–5.1)
SODIUM: 128 mmol/L — AB (ref 135–145)
TCO2: 21 mmol/L — ABNORMAL LOW (ref 22–32)

## 2017-06-09 LAB — BASIC METABOLIC PANEL
Anion gap: 17 — ABNORMAL HIGH (ref 5–15)
BUN: 100 mg/dL — AB (ref 6–20)
CO2: 20 mmol/L — ABNORMAL LOW (ref 22–32)
CREATININE: 9 mg/dL — AB (ref 0.61–1.24)
Calcium: 9.7 mg/dL (ref 8.9–10.3)
Chloride: 91 mmol/L — ABNORMAL LOW (ref 101–111)
GFR, EST AFRICAN AMERICAN: 6 mL/min — AB (ref >60)
GFR, EST NON AFRICAN AMERICAN: 5 mL/min — AB (ref >60)
Glucose, Bld: 512 mg/dL (ref 65–99)
Potassium: 5.2 mmol/L — ABNORMAL HIGH (ref 3.5–5.1)
SODIUM: 128 mmol/L — AB (ref 135–145)

## 2017-06-09 LAB — CBG MONITORING, ED
Glucose-Capillary: 448 mg/dL — ABNORMAL HIGH (ref 65–99)
Glucose-Capillary: 294 mg/dL — ABNORMAL HIGH (ref 65–99)

## 2017-06-09 LAB — PROTIME-INR
INR: 3.42
Prothrombin Time: 34.2 seconds — ABNORMAL HIGH (ref 11.4–15.2)

## 2017-06-09 LAB — PREPARE RBC (CROSSMATCH)

## 2017-06-09 MED ORDER — CALCIUM ACETATE (PHOS BINDER) 667 MG PO CAPS: 667.0000 mg | ORAL_CAPSULE | ORAL | Status: DC

## 2017-06-09 MED ORDER — ACETAMINOPHEN 325 MG PO TABS
650.0000 mg | ORAL_TABLET | Freq: Four times a day (QID) | ORAL | Status: DC | PRN
  Administered 2017-06-10 – 2017-06-13 (×9): 650 mg via ORAL
  Filled 2017-06-09 (×7): qty 2

## 2017-06-09 MED ORDER — CALCIUM ACETATE (PHOS BINDER) 667 MG PO CAPS
1334.0000 mg | ORAL_CAPSULE | Freq: Three times a day (TID) | ORAL | Status: DC
  Administered 2017-06-10 – 2017-06-11 (×5): 1334 mg via ORAL
  Filled 2017-06-09 (×5): qty 2

## 2017-06-09 MED ORDER — SODIUM CHLORIDE 0.9 % IV SOLN: 10.0000 mL/h | Freq: Once | INTRAVENOUS | Status: DC

## 2017-06-09 MED ORDER — ONDANSETRON HCL 4 MG/2ML IJ SOLN: 4.0000 mg | Freq: Four times a day (QID) | INTRAMUSCULAR | Status: DC | PRN

## 2017-06-09 MED ORDER — INSULIN ASPART 100 UNIT/ML ~~LOC~~ SOLN
0.0000 [IU] | Freq: Three times a day (TID) | SUBCUTANEOUS | Status: DC
  Administered 2017-06-10: 5 [IU] via SUBCUTANEOUS
  Administered 2017-06-11 (×2): 1 [IU] via SUBCUTANEOUS
  Administered 2017-06-12: 9 [IU] via SUBCUTANEOUS
  Administered 2017-06-13: 1 [IU] via SUBCUTANEOUS

## 2017-06-09 MED ORDER — SODIUM CHLORIDE 0.9 % IV SOLN
10.0000 mL/h | Freq: Once | INTRAVENOUS | Status: DC
Start: 2017-06-09 — End: 2017-06-09

## 2017-06-09 MED ORDER — ONDANSETRON HCL 4 MG PO TABS: 4.0000 mg | ORAL_TABLET | Freq: Four times a day (QID) | ORAL | Status: DC | PRN

## 2017-06-09 MED ORDER — ACETAMINOPHEN 650 MG RE SUPP: 650.0000 mg | Freq: Four times a day (QID) | RECTAL | Status: DC | PRN

## 2017-06-09 MED ORDER — RENA-VITE PO TABS
1.0000 | ORAL_TABLET | Freq: Every day | ORAL | Status: DC
  Administered 2017-06-09 – 2017-06-12 (×4): 1 via ORAL
  Filled 2017-06-09 (×4): qty 1

## 2017-06-09 MED ORDER — EZETIMIBE 10 MG PO TABS
10.0000 mg | ORAL_TABLET | Freq: Every day | ORAL | Status: DC
  Administered 2017-06-10 – 2017-06-13 (×4): 10 mg via ORAL
  Filled 2017-06-09 (×4): qty 1

## 2017-06-09 MED ORDER — IPRATROPIUM BROMIDE 0.06 % NA SOLN
2.0000 | Freq: Every day | NASAL | Status: DC
  Administered 2017-06-10 – 2017-06-11 (×2): 2 via NASAL
  Filled 2017-06-09: qty 30

## 2017-06-09 MED ORDER — DIPHENHYDRAMINE HCL 25 MG PO CAPS
25.0000 mg | ORAL_CAPSULE | Freq: Once | ORAL | Status: AC
  Administered 2017-06-09: 25 mg via ORAL
  Filled 2017-06-09: qty 1

## 2017-06-09 MED ORDER — GABAPENTIN 100 MG PO CAPS
100.0000 mg | ORAL_CAPSULE | Freq: Every day | ORAL | Status: DC
  Administered 2017-06-09 – 2017-06-12 (×4): 100 mg via ORAL
  Filled 2017-06-09 (×4): qty 1

## 2017-06-09 MED ORDER — GLIMEPIRIDE 1 MG PO TABS
1.0000 mg | ORAL_TABLET | Freq: Every day | ORAL | Status: DC
  Administered 2017-06-10 – 2017-06-13 (×4): 1 mg via ORAL
  Filled 2017-06-09 (×6): qty 1

## 2017-06-09 MED ORDER — FENOFIBRATE 160 MG PO TABS
160.0000 mg | ORAL_TABLET | Freq: Every day | ORAL | Status: DC
  Administered 2017-06-10 – 2017-06-13 (×4): 160 mg via ORAL
  Filled 2017-06-09 (×5): qty 1

## 2017-06-09 MED ORDER — TRANEXAMIC ACID 1000 MG/10ML IV SOLN
500.0000 mg | Freq: Once | INTRAVENOUS | Status: DC
Start: 2017-06-09 — End: 2017-06-09
  Filled 2017-06-09: qty 10

## 2017-06-09 NOTE — ED Notes (Signed)
Abnormal Chem-8 reported to Dr. Oleta Mouse

## 2017-06-09 NOTE — ED Provider Notes (Signed)
[facility] DEPT Provider Note   CSN: [id number] Arrival date & time: 06/09/17  1718     History   Chief Complaint Chief Complaint  Patient presents with  . Vascular Access Problem    HPI James David is a 81 y.o. male.  HPI 81 year old male who presents with bleeding from RUE dialysis fistula. He has a history of atrial fibrillation on Coumadin, diabetes, end-stage renal disease.  He had routine dialysis today, but after dialysis bleeding could not be stopped from his dialysis graft to the right upper extremity. Pressure was held for 2 hours prior to EMS arrival. With continued bleeding on their arrival. Attempts are made to clamp, but were not successful. Patient denies any syncope, near syncope, generalized weakness, chest pain or difficulty breathing.   Past Medical History:  Diagnosis Date  . Atrial fibrillation ([place])   . Chronic kidney disease, stage IV (severe) (HCC)    baseline creatinine 2-3  . DDD (degenerative disc disease)   . Diabetes mellitus without complication ([place])   . ESRD (end stage renal disease) on dialysis ([place])   . Hyperlipidemia   . Hypertension   . Melanoma ([place]) 2010   removed at [facility]  . Second degree heart block    a. s/p STJ dual chamber PPM - Dr [name]  . Stroke [facility]) 01/2017    Patient Active Problem List   Diagnosis Date Noted  . Myasthenia gravis ([place])   . Benign essential HTN   . Labile blood glucose   . Dysphagia   . Leukocytosis   . Pressure injury of skin 03/18/2017  . Acute respiratory failure with hypoxia ([place])   . Myasthenia gravis in crisis ([place]) 03/17/2017  . Diabetes mellitus without complication ([place]) 00/86/7619  . History of CVA in adulthood 03/17/2017  . Atrial fibrillation, permanent ([place]) 03/17/2017  . Supratherapeutic INR 03/17/2017  . HTN (hypertension) 03/17/2017  . Dyslipidemia 03/17/2017  . ESRD on dialysis ([place]) 03/17/2017  . Anemia 03/17/2017  . Low back pain 02/28/2017  . Neck pain 02/28/2017  .  Polyneuropathy 02/28/2017  . Gait disturbance, post-stroke   . Subcortical infarction ([place]) 02/06/2017  . Intracranial vascular stenosis 02/04/2017  . Syncope   . Orthostatic hypotension   . Stroke ([place]) 02/01/2017  . TIA (transient ischemic attack) 01/29/2017  . Hypertension 01/29/2017  . CKD (chronic kidney disease) stage V requiring chronic dialysis ([place]) 01/29/2017  . DDD (degenerative disc disease) 01/29/2017  . HLD (hyperlipidemia) 01/29/2017  . Second degree heart block 01/29/2017  . Atrial flutter ([place]) 01/29/2017  . ESRD (end stage renal disease) on dialysis ([place])   . Melanoma of skin ([place]) 03/13/2016  . Atrial flutter ([place]) 06/26/2015  . Pacemaker 10/13/2014  . End stage renal disease ([place]) 07/15/2014  . Chronic diastolic heart failure ([place]) 03/24/2014  . Second-degree heart block 12/13/2013  . Chronic kidney disease, stage IV (severe) ([place]) 12/13/2013  . Essential hypertension 12/13/2013  . Diabetes mellitus due to underlying condition with diabetic nephropathy ([place]) 12/13/2013  . Hyperlipidemia 12/13/2013    Past Surgical History:  Procedure Laterality Date  . AV FISTULA PLACEMENT Right 05/31/2014   Procedure: Right Arm Brachiocephalic ARTERIOVENOUS FISTULA CREATION  ;  Surgeon: Conrad Coarsegold, MD;  Location: [facility];  Service: Vascular;  Laterality: Right;  . CATARACT EXTRACTION W/ INTRAOCULAR LENS  IMPLANT, BILATERAL    . COLONOSCOPY    . EXCISION MELANOMA WITH SENTINEL LYMPH NODE BIOPSY Right 02/08/2016   Procedure: WIDE EXCISION RIGHT SHOULDER MELANOMA  WITH RIGHT SENTINEL LYMPH NODE BIOPSY;  Surgeon: [name], MD;  Location: [facility];  Service: General;  Laterality: Right;  . LAMINECTOMY    . PERMANENT PACEMAKER INSERTION N/A 01/20/2014   STJ Assurity dual chamber pacemaker implanted by Dr [name] for 2nd degree AV block       Home Medications    Prior to Admission medications   Medication Sig Start Date End Date Taking? Authorizing Provider  acetaminophen  (TYLENOL) 500 MG tablet Take 500 mg by mouth every 8 (eight) hours as needed for mild pain. (pain/headaches)   Yes [provider]  aspirin EC 81 MG tablet Take 81 mg by mouth at bedtime.   Yes [provider]  calcium acetate (PHOSLO) 667 MG capsule Take 667-1,334 mg by mouth See admin instructions. Take 2 capsules (1334 mg) by mouth three times daily with meals and 1 capsule (667 mg) with snacks 03/06/17  Yes [provider]  carvedilol (COREG) 3.125 MG tablet Take 1 tablet (3.125 mg total) by mouth 2 (two) times daily with a meal. 02/14/17  Yes Angiulli, Lavon Paganini, PA-C  ezetimibe (ZETIA) 10 MG tablet Take 1 tablet (10 mg total) by mouth daily. Patient taking differently: Take 10 mg by mouth daily with supper.  02/14/17  Yes Angiulli, Lavon Paganini, PA-C  fenofibrate 160 MG tablet Take 1 tablet (160 mg total) by mouth daily. 02/14/17  Yes Angiulli, Lavon Paganini, PA-C  finasteride (PROSCAR) 5 MG tablet Take 1 tablet (5 mg total) by mouth daily. 02/14/17  Yes Angiulli, Lavon Paganini, PA-C  gabapentin (NEURONTIN) 100 MG capsule Take 1 capsule (100 mg total) by mouth at bedtime. 02/14/17  Yes Angiulli, Lavon Paganini, PA-C  glimepiride (AMARYL) 1 MG tablet Take 1 tablet (1 mg total) by mouth daily with breakfast. 02/14/17  Yes Angiulli, Lavon Paganini, PA-C  Hypromellose (ARTIFICIAL TEARS OP) Place 1 drop into both eyes 3 (three) times daily as needed (for dry eyes).    Yes [provider]  ipratropium (ATROVENT) 0.03 % nasal spray Place 2 sprays into both nostrils daily.    Yes [provider]  multivitamin (RENA-VIT) TABS tablet Take 1 tablet by mouth at bedtime. 02/14/17  Yes Angiulli, Lavon Paganini, PA-C  Nutritional Supplements (ENSURE PO) Take 237 mLs by mouth daily.   Yes [provider]  warfarin (COUMADIN) 1 MG tablet 1 1/2 tab daily Patient taking differently: Take 1.5 mg by mouth daily with supper.  02/14/17  Yes Angiulli, Lavon Paganini, PA-C  Amino Acids-Protein Hydrolys (FEEDING  SUPPLEMENT, PRO-STAT SUGAR FREE 64,) LIQD Take 30 mLs by mouth 2 (two) times daily. Patient not taking: Reported on 06/09/2017 03/25/17   Theodis Blaze, MD  famotidine (PEPCID) 40 MG/5ML suspension Take 2.5 mLs (20 mg total) by mouth at bedtime. Patient not taking: Reported on 06/09/2017 03/26/17   Theodis Blaze, MD  ipratropium-albuterol (DUONEB) 0.5-2.5 (3) MG/3ML SOLN Take 3 mLs by nebulization every 6 (six) hours as needed. Patient not taking: Reported on 06/09/2017 03/25/17   Theodis Blaze, MD  LORazepam (ATIVAN) 1 MG tablet Take 1 tablet (1 mg total) by mouth every 6 (six) hours as needed for anxiety. Patient not taking: Reported on 06/09/2017 03/25/17   Theodis Blaze, MD  Nutritional Supplements (FEEDING SUPPLEMENT, NEPRO CARB STEADY,) LIQD Take 237 mLs by mouth 2 (two) times daily between meals. Patient not taking: Reported on 06/09/2017 03/25/17   Theodis Blaze, MD  predniSONE (DELTASONE) 20 MG tablet Take 1 tablet (20 mg  total) by mouth daily with breakfast. Please note to continue taking 20 mg tablet for now  and you must be evaluated by your neurologist (Dr. [name]) to determine next Prednisone dose and frequency. Patient not taking: Reported on 06/09/2017 03/26/17   Bonnielee Haff, MD  traZODone (DESYREL) 50 MG tablet Take 1 tablet (50 mg total) by mouth at bedtime as needed for sleep. Patient not taking: Reported on 06/09/2017 03/25/17   Theodis Blaze, MD    Family History Family History  Problem Relation Age of Onset  . Pulmonary embolism Mother   . Diabetes Mother   . Heart attack Father   . Diabetes Brother     Social History Social History  Substance Use Topics  . Smoking status: Never Smoker  . Smokeless tobacco: Never Used  . Alcohol use No     Allergies   Amlodipine besylate; Lisinopril; Statins; Ropinirole; and Tape   Review of Systems Review of Systems  Constitutional: Negative for fever.  Respiratory: Negative for shortness of breath.   Cardiovascular:  Negative for chest pain.  Neurological: Negative for syncope and light-headedness.  Hematological: Bruises/bleeds easily.  All other systems reviewed and are negative.    Physical Exam Updated Vital Signs BP 128/79 (BP Location: Left Arm)   Pulse 93   Temp (!) 97.1 F (36.2 C) (Oral)   Resp 15   SpO2 100%   Physical Exam Physical Exam  Constitutional: No acute distress. HENT:  Head: Normocephalic.  Eyes: Conjunctivae are normal. Neck: Supple  Cardiovascular: Normal rate and intact distal pulses.  RUE fistula with active arterial bleeding. Pulmonary/Chest: Effort normal. No respiratory distress.  Abdominal: Exhibits no distension.  Musculoskeletal: Normal range of motion. Exhibits no deformity.  Neurological: Alert. Fluent speech.  Skin: Skin is warm and dry.  Psychiatric: Normal mood and affect. Behavior is normal.  Nursing note and vitals reviewed.   ED Treatments / Results  Labs (all labs ordered are listed, but only abnormal results are displayed) Labs Reviewed  CBC WITH DIFFERENTIAL/PLATELET - Abnormal; Notable for the following:       Result Value   WBC 12.1 (*)    RBC 3.69 (*)    Hemoglobin 11.5 (*)    HCT 36.6 (*)    RDW 16.5 (*)    Neutro Abs 10.6 (*)    All other components within normal limits  PROTIME-INR - Abnormal; Notable for the following:    Prothrombin Time 34.2 (*)    All other components within normal limits  BASIC METABOLIC PANEL - Abnormal; Notable for the following:    Sodium 128 (*)    Potassium 5.2 (*)    Chloride 91 (*)    CO2 20 (*)    Glucose, Bld 512 (*)    BUN 100 (*)    Creatinine, Ser 9.00 (*)    GFR calc non Af Amer 5 (*)    GFR calc Af Amer 6 (*)    Anion gap 17 (*)    All other components within normal limits  I-STAT CHEM 8, ED - Abnormal; Notable for the following:    Sodium 128 (*)    Chloride 94 (*)    BUN 100 (*)    Creatinine, Ser 9.10 (*)    Glucose, Bld 533 (*)    Calcium, Ion 1.13 (*)    TCO2 21 (*)     Hemoglobin 12.9 (*)    HCT 38.0 (*)    All other components within normal limits  CBG MONITORING,  ED - Abnormal; Notable for the following:    Glucose-Capillary 448 (*)    All other components within normal limits  PREPARE RBC (CROSSMATCH)  TYPE AND SCREEN  PREPARE FRESH FROZEN PLASMA    EKG  EKG Interpretation  Date/Time:  Monday June 09 2017 17:24:34 EDT Ventricular Rate:  92 PR Interval:    QRS Duration: 150 QT Interval:  413 QTC Calculation: 511 R Axis:   -95 Text Interpretation:  Sinus rhythm Right atrial enlargement Nonspecific IVCD with LAD Anterolateral infarct, old Although rate has increased similar to previous EKG  Confirmed by Brantley Stage [phone]) on 06/09/2017 5:51:43 PM       Radiology No results found.  Procedures Procedures (including critical care time) CRITICAL CARE Performed by: Forde Dandy   Total critical care time: 45 minutes  Critical care time was exclusive of separately billable procedures and treating other patients.  Critical care was necessary to treat or prevent imminent or life-threatening deterioration.  Critical care was time spent personally by me on the following activities: development of treatment plan with patient and/or surrogate as well as nursing, discussions with consultants, evaluation of patient's response to treatment, examination of patient, obtaining history from patient or surrogate, ordering and performing treatments and interventions, ordering and review of laboratory studies, ordering and review of radiographic studies, pulse oximetry and re-evaluation of patient's condition.  Medications Ordered in ED Medications  0.9 %  sodium chloride infusion (not administered)  0.9 %  sodium chloride infusion (not administered)     Initial Impression / Assessment and Plan / ED Course  I have reviewed the triage vital signs and the nursing notes.  Pertinent labs & imaging results that were available during my care of the  patient were reviewed by me and considered in my medical decision making (see chart for details).     Continued bleeding from dialysis fistula on arrival. Pinpoint pressure applied with combat gauze x 15 minutes with persistent bleeding. More deep pressure being held at bedside. Dr. Donnetta Hutching consulted who will come to ED for evaluation.   Initially normotensive. During course becomes hypotensive. 70-80s SBP. Emergent release blood ordered and transfusing 2 units. On coumadin, no INR from lab back. Initial plan to give FFP, however, bleeding stopped when Dr. Donnetta Hutching arrived to evaluate. Now normotensive after blood.   Hyperglycemic on blood work but felt likely stress reaction to brief hemorrhagic shock. Discussed with Dr. Hal Hope, who will admit overnight for observation.    Final Clinical Impressions(s) / ED Diagnoses   Final diagnoses:  ESRD (end stage renal disease) ([place])  Complication of vascular access for dialysis, initial encounter  Hemorrhagic shock [name])    New Prescriptions New Prescriptions   No medications on file     Forde Dandy, MD 06/09/17 [phone]

## 2017-06-09 NOTE — ED Notes (Addendum)
Dr Early at bedside.  Dr Oleta Mouse states to hold FFP until INR resulted.

## 2017-06-09 NOTE — H&P (Signed)
History and Physical    MCGWIRE DASARO KGY:[id number] DOB: 1936/01/13 DOA: 06/09/2017  PCP: [name], MD  Patient coming from: home.  Chief Complaint: Bleeding from the fistula.  HPI: James David is a 81 y.o. male with history of paroxysmal atrial fibrillation on Coumadin, ESRD on hemodialysis, mass and a gravis and stroke presented to the ER after patient had persistent bleeding from the AV fistula site after dialysis. Patient denies any trauma to the site.   ED Course: in the ER patient was found to be hypotensive and was given fluid and PRBC transfusion. Patient's AV fistula had pressure dressing placed. On call vascular surgeon Dr.Early was consulted and this time patient's bleeding is stopped after pressure dressing. INR is around 3. Patient blood sugar also was elevated. At the time of my exam it is decreased to 250. Patient is being admitted for further observation of bleeding fistula. Patient denies any chest pain or shortness of breath.  Review of Systems: As per HPI, rest all negative.   Past Medical History:  Diagnosis Date  . Atrial fibrillation ([place])   . Chronic kidney disease, stage IV (severe) (HCC)    baseline creatinine 2-3  . DDD (degenerative disc disease)   . Diabetes mellitus without complication ([place])   . ESRD (end stage renal disease) on dialysis ([place])   . Hyperlipidemia   . Hypertension   . Melanoma ([place]) 2010   removed at [facility]  . Second degree heart block    a. s/p STJ dual chamber PPM - Dr [name]  . Stroke [facility]) 01/2017    Past Surgical History:  Procedure Laterality Date  . AV FISTULA PLACEMENT Right 05/31/2014   Procedure: Right Arm Brachiocephalic ARTERIOVENOUS FISTULA CREATION  ;  Surgeon: Conrad [place], MD;  Location: [place];  Service: Vascular;  Laterality: Right;  . CATARACT EXTRACTION W/ INTRAOCULAR LENS  IMPLANT, BILATERAL    . COLONOSCOPY    . EXCISION MELANOMA WITH SENTINEL LYMPH NODE BIOPSY Right 02/08/2016   Procedure: WIDE  EXCISION RIGHT SHOULDER MELANOMA WITH RIGHT SENTINEL LYMPH NODE BIOPSY;  Surgeon: [name], MD;  Location: [facility];  Service: General;  Laterality: Right;  . LAMINECTOMY    . PERMANENT PACEMAKER INSERTION N/A 01/20/2014   STJ Assurity dual chamber pacemaker implanted by Dr [name] for 2nd degree AV block     reports that he has never smoked. He has never used smokeless tobacco. He reports that he does not drink alcohol or use drugs.  Allergies  Allergen Reactions  . Amlodipine Besylate Swelling    Angioedema   . Lisinopril Other (See Comments)    Renal insufficiency  . Statins Other (See Comments)    Muscle aches  . Ropinirole Other (See Comments)    Causes excessive grogginess the following morning after taking  . Tape Other (See Comments)    SKIN IS THIN AND TAPE WILL ACTUALLY TEAR IT; PLEASE USE COBAN WRAP INSTEAD!!    Family History  Problem Relation Age of Onset  . Pulmonary embolism Mother   . Diabetes Mother   . Heart attack Father   . Diabetes Brother     Prior to Admission medications   Medication Sig Start Date End Date Taking? Authorizing Provider  acetaminophen (TYLENOL) 500 MG tablet Take 500 mg by mouth every 8 (eight) hours as needed for mild pain. (pain/headaches)   Yes [provider]  aspirin EC 81 MG tablet Take 81 mg by mouth at bedtime.  Yes [provider]  calcium acetate (PHOSLO) 667 MG capsule Take 667-1,334 mg by mouth See admin instructions. Take 2 capsules (1334 mg) by mouth three times daily with meals and 1 capsule (667 mg) with snacks 03/06/17  Yes [provider]  carvedilol (COREG) 3.125 MG tablet Take 1 tablet (3.125 mg total) by mouth 2 (two) times daily with a meal. 02/14/17  Yes Angiulli, Lavon Paganini, PA-C  ezetimibe (ZETIA) 10 MG tablet Take 1 tablet (10 mg total) by mouth daily. Patient taking differently: Take 10 mg by mouth daily with supper.  02/14/17  Yes Angiulli, Lavon Paganini, PA-C  fenofibrate 160 MG tablet Take 1  tablet (160 mg total) by mouth daily. 02/14/17  Yes Angiulli, Lavon Paganini, PA-C  finasteride (PROSCAR) 5 MG tablet Take 1 tablet (5 mg total) by mouth daily. 02/14/17  Yes Angiulli, Lavon Paganini, PA-C  gabapentin (NEURONTIN) 100 MG capsule Take 1 capsule (100 mg total) by mouth at bedtime. 02/14/17  Yes Angiulli, Lavon Paganini, PA-C  glimepiride (AMARYL) 1 MG tablet Take 1 tablet (1 mg total) by mouth daily with breakfast. 02/14/17  Yes Angiulli, Lavon Paganini, PA-C  Hypromellose (ARTIFICIAL TEARS OP) Place 1 drop into both eyes 3 (three) times daily as needed (for dry eyes).    Yes [provider]  ipratropium (ATROVENT) 0.03 % nasal spray Place 2 sprays into both nostrils daily.    Yes [provider]  multivitamin (RENA-VIT) TABS tablet Take 1 tablet by mouth at bedtime. 02/14/17  Yes Angiulli, Lavon Paganini, PA-C  Nutritional Supplements (ENSURE PO) Take 237 mLs by mouth daily.   Yes [provider]  warfarin (COUMADIN) 1 MG tablet 1 1/2 tab daily Patient taking differently: Take 1.5 mg by mouth daily with supper.  02/14/17  Yes Angiulli, Lavon Paganini, PA-C  Amino Acids-Protein Hydrolys (FEEDING SUPPLEMENT, PRO-STAT SUGAR FREE 64,) LIQD Take 30 mLs by mouth 2 (two) times daily. Patient not taking: Reported on 06/09/2017 03/25/17   Theodis Blaze, MD  famotidine (PEPCID) 40 MG/5ML suspension Take 2.5 mLs (20 mg total) by mouth at bedtime. Patient not taking: Reported on 06/09/2017 03/26/17   Theodis Blaze, MD  ipratropium-albuterol (DUONEB) 0.5-2.5 (3) MG/3ML SOLN Take 3 mLs by nebulization every 6 (six) hours as needed. Patient not taking: Reported on 06/09/2017 03/25/17   Theodis Blaze, MD  LORazepam (ATIVAN) 1 MG tablet Take 1 tablet (1 mg total) by mouth every 6 (six) hours as needed for anxiety. Patient not taking: Reported on 06/09/2017 03/25/17   Theodis Blaze, MD  Nutritional Supplements (FEEDING SUPPLEMENT, NEPRO CARB STEADY,) LIQD Take 237 mLs by mouth 2 (two) times daily between meals. Patient not  taking: Reported on 06/09/2017 03/25/17   Theodis Blaze, MD  predniSONE (DELTASONE) 20 MG tablet Take 1 tablet (20 mg total) by mouth daily with breakfast. Please note to continue taking 20 mg tablet for now  and you must be evaluated by your neurologist (Dr. [name]) to determine next Prednisone dose and frequency. Patient not taking: Reported on 06/09/2017 03/26/17   Bonnielee Haff, MD  traZODone (DESYREL) 50 MG tablet Take 1 tablet (50 mg total) by mouth at bedtime as needed for sleep. Patient not taking: Reported on 06/09/2017 03/25/17   Theodis Blaze, MD    Physical Exam: Vitals:   06/09/17 1830 06/09/17 1845 06/09/17 1857 06/09/17 1925  BP: 127/83 131/79  128/79  Pulse: 95   93  Resp: 16 16  15   Temp:   Marland Kitchen)  97.1 F (36.2 C)   TempSrc:   Oral   SpO2: 96%   100%      Constitutional: moderately built and nourished. Vitals:   06/09/17 1830 06/09/17 1845 06/09/17 1857 06/09/17 1925  BP: 127/83 131/79  128/79  Pulse: 95   93  Resp: 16 16  15   Temp:   (!) 97.1 F (36.2 C)   TempSrc:   Oral   SpO2: 96%   100%   Eyes: anicteric mild pallor. ENMT: no discharge from the ears eyes nose and mouth. Neck: no mass felt. No JVD appreciated. Respiratory: no rhonchi or crepitations. Cardiovascular: S1-S2 heard no murmurs appreciated. Abdomen: soft nontender bowel sounds present. Musculoskeletal: no edema. No joint effusion right arm has pressure dressing. Skin: no rash. Chronic skin changes. Neurologic:alert awake oriented to time place and person. Moves all extremities. Psychiatric: appears normal. Normal affect.   Labs on Admission: I have personally reviewed following labs and imaging studies  CBC:  Recent Labs Lab 06/09/17 1730 06/09/17 1749  WBC 12.1*  --   NEUTROABS 10.6*  --   HGB 11.5* 12.9*  HCT 36.6* 38.0*  MCV 99.2  --   PLT 385  --    Basic Metabolic Panel:  Recent Labs Lab 06/09/17 1730 06/09/17 1749  NA 128* 128*  K 5.2* 5.1  CL 91* 94*  CO2 20*  --     GLUCOSE 512* 533*  BUN 100* 100*  CREATININE 9.00* 9.10*  CALCIUM 9.7  --    GFR: CrCl cannot be calculated (Unknown ideal weight.). Liver Function Tests: No results for input(s): AST, ALT, ALKPHOS, BILITOT, PROT, ALBUMIN in the last 168 hours. No results for input(s): LIPASE, AMYLASE in the last 168 hours. No results for input(s): AMMONIA in the last 168 hours. Coagulation Profile:  Recent Labs Lab 06/09/17 1730  INR 3.42   Cardiac Enzymes: No results for input(s): CKTOTAL, CKMB, CKMBINDEX, TROPONINI in the last 168 hours. BNP (last 3 results) No results for input(s): PROBNP in the last 8760 hours. HbA1C: No results for input(s): HGBA1C in the last 72 hours. CBG:  Recent Labs Lab 06/09/17 1922  GLUCAP 448*   Lipid Profile: No results for input(s): CHOL, HDL, LDLCALC, TRIG, CHOLHDL, LDLDIRECT in the last 72 hours. Thyroid Function Tests: No results for input(s): TSH, T4TOTAL, FREET4, T3FREE, THYROIDAB in the last 72 hours. Anemia Panel: No results for input(s): VITAMINB12, FOLATE, FERRITIN, TIBC, IRON, RETICCTPCT in the last 72 hours. Urine analysis:    Component Value Date/Time   COLORURINE YELLOW 03/18/2017 0945   APPEARANCEUR TURBID (A) 03/18/2017 0945   LABSPEC 1.012 03/18/2017 0945   PHURINE 6.0 03/18/2017 0945   GLUCOSEU NEGATIVE 03/18/2017 0945   HGBUR LARGE (A) 03/18/2017 0945   BILIRUBINUR NEGATIVE 03/18/2017 0945   KETONESUR NEGATIVE 03/18/2017 0945   PROTEINUR 100 (A) 03/18/2017 0945   NITRITE NEGATIVE 03/18/2017 0945   LEUKOCYTESUR MODERATE (A) 03/18/2017 0945   Sepsis Labs: @LABRCNTIP (procalcitonin:4,lacticidven:4) )No results found for this or any previous visit (from the past 240 hour(s)).   Radiological Exams on Admission: No results found.  EKG: Independently reviewed. Normal sinus rhythm IVCD.  Assessment/Plan Principal Problem:   Bleeding at insertion site Active Problems:   Diabetes mellitus due to underlying condition with  diabetic nephropathy (HCC)   Chronic diastolic heart failure (HCC)   History of CVA in adulthood   Atrial fibrillation, permanent (HCC)   HTN (hypertension)   ESRD on dialysis ([place])   Myasthenia gravis ([place])  1. Bleeding AV fistula with early shock - improved with fluid resuscitation and PRBC transfusion. Bleeding stopped after pressure dressing. Outpatient vascular surgery consult will closely monitor. Hold Coumadin. 2. Diabetes mellitus type 2 uncontrolled with hyperglycemia - on admission patient's blood sugar was in the 400s now and this decreased to 250.Follow CBGs and metabolic panel closely. Patient is on Amaryl. I have placed patient on sliding scale coverage. 3. History of myasthenia gravis and had recent crisis - I do not see any home medications for myasthenia gravis. Need to check with patient's pharmacy in morning. 4. History of embolic stroke recently - holding Coumadin. Patient agreeable. Follow INR. 5. Acute blood loss anemia - patient received a PRBC. Follow CBC. Follow respiratory status. 6. ESRD on hemodialysis on Monday Wednesday and Friday - had dialysis yesterday. Patient did receive fluids and PRBC the ER. Monitor respiratory status. 7. Paroxysmal atrial fibrillation - holding Coumadin due to bleeding fistula were on Coreg. 8. History of COPD presently not wheezing. 9. History of CAD denies any chest pain.   DVT prophylaxis: SCDs. Code Status: full code.  Family Communication: discussed with patient.  Disposition Plan: home.  Consults called: vascular surgery.  Admission status: inpatient.    Rise Patience MD [facility] Pager [phone].  If 7PM-7AM, please contact night-coverage [email or web address] Password [facility]  06/09/2017, 7:56 PM

## 2017-06-09 NOTE — ED Notes (Signed)
Placed on hospital bed at this time 

## 2017-06-09 NOTE — ED Notes (Signed)
Dr. Kakrakandy at bedside. 

## 2017-06-09 NOTE — Consult Note (Signed)
[facility] [place]  Patient name: James David MRN: [id number] DOB: 1936-07-24 Sex: male    HPI: James David is a 81 y.o. male seen in the emergency room related to bleeding from his right upper arm AV fistula. This was placed by Dr. Bridgett Larsson in 2015. He also is in chronic atrial fibrillation and has Coumadin therapy related to this. According to his wife he had a stroke felt to be embolic less than a year ago and has been maintained on Coumadin. They report that he does frequently bleed after hemodialysis removal of the needles. His skin is very fragile and he has had multiple skin tears as well. Apparently continued to bleed after hemodialysis today and was brought to the emergency room by EMS. He continued to bleed in the emergency room and was treated with packed red blood cells and FFP is pending. INR level is pending as well. Currently his bleeding is being controlled by digital pressure over the sit  Past Medical History:  Diagnosis Date  . Atrial fibrillation ([place])   . Chronic kidney disease, stage IV (severe) (HCC)    baseline creatinine 2-3  . DDD (degenerative disc disease)   . Diabetes mellitus without complication ([place])   . ESRD (end stage renal disease) on dialysis ([place])   . Hyperlipidemia   . Hypertension   . Melanoma ([place]) 2010   removed at [facility]  . Second degree heart block    a. s/p STJ dual chamber PPM - Dr [name]  . Stroke [facility]) 01/2017    Family History  Problem Relation Age of Onset  . Pulmonary embolism Mother   . Diabetes Mother   . Heart attack Father   . Diabetes Brother     SOCIAL HISTORY: Social History  Substance Use Topics  . Smoking status: Never Smoker  . Smokeless tobacco: Never Used  . Alcohol use No    Allergies  Allergen Reactions  . Amlodipine Besylate Swelling    Angioedema   . Lisinopril Other (See Comments)    Renal insufficiency  . Statins Other (See Comments)   Muscle aches  . Ropinirole Other (See Comments)    Causes excessive grogginess the following morning after taking  . Tape Other (See Comments)    SKIN IS THIN AND TAPE WILL ACTUALLY TEAR IT; PLEASE USE COBAN WRAP INSTEAD!!    Current Facility-Administered Medications  Medication Dose Route Frequency Provider Last Rate Last Dose  . 0.9 %  sodium chloride infusion  10 mL/hr Intravenous Once Brantley Stage Duo, MD      . 0.9 %  sodium chloride infusion  10 mL/hr Intravenous Once Forde Dandy, MD      . tranexamic acid (CYKLOKAPRON) injection 500 mg  500 mg Topical Once Forde Dandy, MD       Current Outpatient Prescriptions  Medication Sig Dispense Refill  . acetaminophen (TYLENOL) 500 MG tablet Take 500 mg by mouth every 8 (eight) hours as needed for mild pain. (pain/headaches)    . aspirin EC 81 MG tablet Take 81 mg by mouth at bedtime.    . calcium acetate (PHOSLO) 667 MG capsule Take 667-1,334 mg by mouth See admin instructions. Take 2 capsules (1334 mg) by mouth three times daily with meals and 1 capsule (667 mg) with snacks    . carvedilol (COREG) 3.125 MG tablet Take 1 tablet (3.125 mg total) by mouth 2 (two) times daily with a meal. 60 tablet 1  .  ezetimibe (ZETIA) 10 MG tablet Take 1 tablet (10 mg total) by mouth daily. (Patient taking differently: Take 10 mg by mouth daily with supper. ) 30 tablet 1  . fenofibrate 160 MG tablet Take 1 tablet (160 mg total) by mouth daily. 30 tablet 0  . finasteride (PROSCAR) 5 MG tablet Take 1 tablet (5 mg total) by mouth daily. 30 tablet 1  . gabapentin (NEURONTIN) 100 MG capsule Take 1 capsule (100 mg total) by mouth at bedtime. 30 capsule 1  . glimepiride (AMARYL) 1 MG tablet Take 1 tablet (1 mg total) by mouth daily with breakfast. 30 tablet 1  . Hypromellose (ARTIFICIAL TEARS OP) Place 1 drop into both eyes 3 (three) times daily as needed (for dry eyes).     Marland Kitchen ipratropium (ATROVENT) 0.03 % nasal spray Place 2 sprays into both nostrils daily.     .  multivitamin (RENA-VIT) TABS tablet Take 1 tablet by mouth at bedtime. 30 tablet 0  . Nutritional Supplements (ENSURE PO) Take 237 mLs by mouth daily.    Marland Kitchen warfarin (COUMADIN) 1 MG tablet 1 1/2 tab daily (Patient taking differently: Take 1.5 mg by mouth daily with supper. ) 30 tablet 1  . Amino Acids-Protein Hydrolys (FEEDING SUPPLEMENT, PRO-STAT SUGAR FREE 64,) LIQD Take 30 mLs by mouth 2 (two) times daily. (Patient not taking: Reported on 06/09/2017) 900 mL 0  . famotidine (PEPCID) 40 MG/5ML suspension Take 2.5 mLs (20 mg total) by mouth at bedtime. (Patient not taking: Reported on 06/09/2017) 50 mL 0  . ipratropium-albuterol (DUONEB) 0.5-2.5 (3) MG/3ML SOLN Take 3 mLs by nebulization every 6 (six) hours as needed. (Patient not taking: Reported on 06/09/2017) 360 mL 0  . LORazepam (ATIVAN) 1 MG tablet Take 1 tablet (1 mg total) by mouth every 6 (six) hours as needed for anxiety. (Patient not taking: Reported on 06/09/2017) 30 tablet 0  . Nutritional Supplements (FEEDING SUPPLEMENT, NEPRO CARB STEADY,) LIQD Take 237 mLs by mouth 2 (two) times daily between meals. (Patient not taking: Reported on 06/09/2017)  0  . predniSONE (DELTASONE) 20 MG tablet Take 1 tablet (20 mg total) by mouth daily with breakfast. Please note to continue taking 20 mg tablet for now  and you must be evaluated by your neurologist (Dr. [name]) to determine next Prednisone dose and frequency. (Patient not taking: Reported on 06/09/2017) 7 tablet 1  . traZODone (DESYREL) 50 MG tablet Take 1 tablet (50 mg total) by mouth at bedtime as needed for sleep. (Patient not taking: Reported on 06/09/2017)      REVIEW OF SYSTEMS:  [X]  denotes positive finding, [ ]  denotes negative finding Cardiac  Comments:  Chest pain or chest pressure:    Shortness of breath upon exertion:    Short of breath when lying flat:    Irregular heart rhythm: x       Vascular    Pain in calf, thigh, or hip brought on by ambulation:    Pain in feet at night that  wakes you up from your sleep:     Blood clot in your veins:    Leg swelling:           PHYSICAL EXAM: Vitals:   06/09/17 1757 06/09/17 1800 06/09/17 1815 06/09/17 1830  BP:  108/74 115/68 127/83  Pulse:  (!) 102 98 95  Resp:  (!) 23 (!) 25 16  Temp: 97.8 F (36.6 C)     TempSrc: Oral     SpO2:  100% 100% 96%  GENERAL: The patient is a well-nourished male, in no acute distress. The vital signs are documented above. CARDIOVASCULAR: Palpable left radial pulse. PULMONARY: There is good air exchange  MUSCULOSKELETAL: There are no major deformities or cyanosis. NEUROLOGIC: No focal weakness or paresthesias are detected. SKIN: There are no ulcers or rashes noted. PSYCHIATRIC: The patient has a normal affect.  DATA:  Hemoglobin and hematocrit 12.9 and 38.0. INR pending  MEDICAL ISSUES: I held digital pressure over the more proximal vein and removed the dressing from both puncture sites. There was no further bleeding. I compressed the vein distal to the bleeding sites and even under this elevated pressure there was no further bleeding. There was no evidence of ulceration or skin breakdown. This simply appeared to be continued bleeding from the puncture with anticoagulation on board. I redressed the arm with 4 x 4 Kerlix and Coban for some pressure. I have told the patient and wife to leave the Coban on until his next dialysis treatment in 48 hours. They will also discuss potential alternatives to his Coumadin anticoagulation therapy. This will be difficult since he has had presumed stroke related to embolic event. We will not follow actively. The fistula appears to be working normally.    Rosetta Posner, MD FACS [facility] Tel [phone] Pager [phone]

## 2017-06-09 NOTE — ED Triage Notes (Signed)
Patient here for evaluation of bleeding dialysis graft to the right biceps.  Staff at dialysis center stated they held pressure for 2 hrs prior to EMS arrival.  NO LOC or dizziness.  Patient is A&Ox4.

## 2017-06-09 NOTE — ED Notes (Signed)
Requested hospital bed 

## 2017-06-09 NOTE — ED Notes (Signed)
Offered meal at this time, pt refused. Given ice water.

## 2017-06-10 DIAGNOSIS — I482 Chronic atrial fibrillation: Secondary | ICD-10-CM

## 2017-06-10 DIAGNOSIS — I5032 Chronic diastolic (congestive) heart failure: Secondary | ICD-10-CM

## 2017-06-10 DIAGNOSIS — L7682 Other postprocedural complications of skin and subcutaneous tissue: Secondary | ICD-10-CM

## 2017-06-10 DIAGNOSIS — E0821 Diabetes mellitus due to underlying condition with diabetic nephropathy: Secondary | ICD-10-CM

## 2017-06-10 DIAGNOSIS — I10 Essential (primary) hypertension: Secondary | ICD-10-CM

## 2017-06-10 LAB — GLUCOSE, CAPILLARY
GLUCOSE-CAPILLARY: 262 mg/dL — AB (ref 65–99)
GLUCOSE-CAPILLARY: 280 mg/dL — AB (ref 65–99)
GLUCOSE-CAPILLARY: 281 mg/dL — AB (ref 65–99)
Glucose-Capillary: 95 mg/dL (ref 65–99)
Glucose-Capillary: 216 mg/dL — ABNORMAL HIGH (ref 65–99)

## 2017-06-10 LAB — BASIC METABOLIC PANEL
Anion gap: 19 — ABNORMAL HIGH (ref 5–15)
BUN: 110 mg/dL — AB (ref 6–20)
CALCIUM: 9.3 mg/dL (ref 8.9–10.3)
CO2: 17 mmol/L — ABNORMAL LOW (ref 22–32)
CREATININE: 9.54 mg/dL — AB (ref 0.61–1.24)
Chloride: 97 mmol/L — ABNORMAL LOW (ref 101–111)
GFR calc Af Amer: 5 mL/min — ABNORMAL LOW (ref >60)
GFR, EST NON AFRICAN AMERICAN: 5 mL/min — AB (ref >60)
Glucose, Bld: 102 mg/dL — ABNORMAL HIGH (ref 65–99)
Potassium: 4.3 mmol/L (ref 3.5–5.1)
SODIUM: 133 mmol/L — AB (ref 135–145)

## 2017-06-10 LAB — TYPE AND SCREEN
ABO/RH(D): B POS
ANTIBODY SCREEN: NEGATIVE
UNIT DIVISION: 0
Unit division: 0

## 2017-06-10 LAB — BPAM RBC
BLOOD PRODUCT EXPIRATION DATE: 201809292359
BLOOD PRODUCT EXPIRATION DATE: 201809302359
ISSUE DATE / TIME: 201809241736
ISSUE DATE / TIME: 201809241736
UNIT TYPE AND RH: 9500
Unit Type and Rh: 9500

## 2017-06-10 LAB — CBC
HCT: 38.5 % — ABNORMAL LOW (ref 39.0–52.0)
Hemoglobin: 13.3 g/dL (ref 13.0–17.0)
MCH: 31.5 pg (ref 26.0–34.0)
MCHC: 34.5 g/dL (ref 30.0–36.0)
MCV: 91.2 fL (ref 78.0–100.0)
PLATELETS: 220 10*3/uL (ref 150–400)
RBC: 4.22 MIL/uL (ref 4.22–5.81)
RDW: 20.6 % — AB (ref 11.5–15.5)
WBC: 11.6 10*3/uL — AB (ref 4.0–10.5)

## 2017-06-10 LAB — PROTIME-INR
INR: 3.1
PROTHROMBIN TIME: 31.7 s — AB (ref 11.4–15.2)

## 2017-06-10 LAB — BLOOD PRODUCT ORDER (VERBAL) VERIFICATION

## 2017-06-10 MED ORDER — DOXERCALCIFEROL 4 MCG/2ML IV SOLN
4.0000 ug | INTRAVENOUS | Status: DC
  Administered 2017-06-13: 4 ug via INTRAVENOUS
  Filled 2017-06-10 (×2): qty 2

## 2017-06-10 NOTE — Progress Notes (Addendum)
[facility] PROGRESS NOTE  James David WUJ:[id number] DOB: [birth year] DOA: 06/09/2017 PCP: [name], MD  Assessment/Plan: 1. Bleeding AV fistula with early shock - improved with fluid resuscitation and PRBC transfusion. Bleeding stopped after pressure dressing. Per vascular should be fine and needs 48hrs of dressing. Hold Coumadin. 2. Diabetes mellitus type 2 uncontrolled with hyperglycemia -  sliding scale coverage. 3. History of myasthenia gravis and had recent crisis - I do not see any home medications for myasthenia gravis. Need to check with patient's pharmacy in morning. 4. History of embolic stroke recently - holding Coumadin. Patient agreeable. Follow INR. 5. Acute blood loss anemia - patient received a PRBC. Follow CBC. Follow respiratory status. 6. ESRD on hemodialysis on Monday Wednesday and Friday - had dialysis yesterday. Patient did receive fluids and PRBC the ER. Monitor respiratory status. 7. Paroxysmal atrial fibrillation - holding Coumadin due to bleeding fistula were on Coreg. 8. History of COPD presently not wheezing. 9. History of CAD denies any chest pain.   Consultants:  vascular  Procedures:  n/a  Antibiotics:  none (indicate start date, and stop date if known)  HPI/Subjective: No bleeding. Doing well.  Objective: Vitals:   06/10/17 0411 06/10/17 0920  BP: 120/69 112/72  Pulse: 91 87  Resp: 16 16  Temp: 98 F (36.7 C) 98.4 F (36.9 C)  SpO2: 100% 100%    Intake/Output Summary (Last 24 hours) at 06/10/17 1647 Last data filed at 06/10/17 0900  Gross per 24 hour  Intake              870 ml  Output                0 ml  Net              870 ml   Filed Weights   06/09/17 2206 06/09/17 2257  Weight: 75.6 kg (166 lb 11.2 oz) 75.3 kg (166 lb)    Exam:   General:  NAD, NCAT  Cardiovascular: RRR, no MRG  Respiratory: CTAB, no MRG  Abdomen: ND, NTTP  AV fistula with palpable thrill, dressing on it is clean dry and  intact  Data Reviewed: Basic Metabolic Panel:  Recent Labs Lab 06/09/17 1730 06/09/17 1749 06/10/17 0604  NA 128* 128* 133*  K 5.2* 5.1 4.3  CL 91* 94* 97*  CO2 20*  --  17*  GLUCOSE 512* 533* 102*  BUN 100* 100* 110*  CREATININE 9.00* 9.10* 9.54*  CALCIUM 9.7  --  9.3   Liver Function Tests: No results for input(s): AST, ALT, ALKPHOS, BILITOT, PROT, ALBUMIN in the last 168 hours. No results for input(s): LIPASE, AMYLASE in the last 168 hours. No results for input(s): AMMONIA in the last 168 hours. CBC:  Recent Labs Lab 06/09/17 1730 06/09/17 1749 06/10/17 0604  WBC 12.1*  --  11.6*  NEUTROABS 10.6*  --   --   HGB 11.5* 12.9* 13.3  HCT 36.6* 38.0* 38.5*  MCV 99.2  --  91.2  PLT 385  --  220   Cardiac Enzymes: No results for input(s): CKTOTAL, CKMB, CKMBINDEX, TROPONINI in the last 168 hours. BNP (last 3 results)  Recent Labs  03/17/17 1304  BNP 479.7*    ProBNP (last 3 results) No results for input(s): PROBNP in the last 8760 hours.  CBG:  Recent Labs Lab 06/09/17 1922 06/09/17 2203 06/09/17 2300 06/10/17 0800 06/10/17 1232  GLUCAP 448* 294* 262* 95 280*    No results found  for this or any previous visit (from the past 240 hour(s)).   Studies: No results found.  Scheduled Meds: . calcium acetate  1,334 mg Oral TID WC  . calcium acetate  667 mg Oral With snacks  . [START ON 06/11/2017] doxercalciferol  4 mcg Intravenous Q M,W,F-HD  . ezetimibe  10 mg Oral Q supper  . fenofibrate  160 mg Oral Daily  . gabapentin  100 mg Oral QHS  . glimepiride  1 mg Oral Q breakfast  . insulin aspart  0-9 Units Subcutaneous TID WC  . ipratropium  2 spray Each Nare Daily  . multivitamin  1 tablet Oral QHS   Continuous Infusions:  Principal Problem:   Bleeding at insertion site Active Problems:   Diabetes mellitus due to underlying condition with diabetic nephropathy (HCC)   Chronic diastolic heart failure (HCC)   History of CVA in adulthood   Atrial  fibrillation, permanent ([place])   HTN (hypertension)   ESRD on dialysis ([place])   Myasthenia gravis ([place])    Time spent: [place]. If 7PM-7AM, please contact night-coverage at [email or web address], password [facility] 06/10/2017, 4:47 PM  LOS: 1 day

## 2017-06-10 NOTE — Consult Note (Signed)
[facility] Renal Consultation Note    Indication for Consultation:  Management of ESRD/hemodialysis, anemia, hypertension/volume, and secondary hyperparathyroidism. PCP:  HPI: James David is a 81 y.o. 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  Mr. Galli has chronic weakness since the Dx of Myasthenia gravis in 03/2017. His prior treatment consisted of 2 doses of IVIG and oral prednisone.Tried oral Mestinon but had side effects (diarrhea). Per notes, looks like being arranged for another IVIG dose as outpt. Currently, still weak, but no other new symptoms. Denies CP, dyspnea. After dialysis yesterday, he had prolonged bleeding which was unable to be stopped. He was transferred to the ED for further evaluation. There, labs showed Hgb 11.5, K 5.1, INR elevated at 3.42. Vascular surgery was consulted, and examined the patient. By that time, the bleeding had stopped and no bleeding was able to be evoked. No plans for intervention planned for now. At this point, it seems like the weakness is the main reason he is still here.  Usually dialyzes MWF at [facility]. Last HD 9/24. Uses R AVF. Last AVF intervention was declot 01/2017 showing "95% cephalic vein arch stenosis treated to <10% with 8x 60 Fluency stent after refractory to balloon angioplasty. 09% outflow cephalic vein stenosis treated to 10% with 7x8 Conquest angioplasty. 32% inflow cephalic vein stenosis treated to 10% with 7x8 Conquest angioplasty. Patent arterial anastomosis. <30% right innominate vein stenosis." No further interventions since that time. Access flows have remained stable.  Past Medical History:  Diagnosis Date  . Atrial fibrillation ([place])   . Chronic kidney disease, stage IV (severe) (HCC)    baseline creatinine 2-3  .  DDD (degenerative disc disease)   . Diabetes mellitus without complication ([place])   . ESRD (end stage renal disease) on dialysis ([place])   . Hyperlipidemia   . Hypertension   . Melanoma ([place]) 2010   removed at [facility]  . Second degree heart block    a. s/p STJ dual chamber PPM - Dr [name]  . Stroke [facility]) 01/2017   Past Surgical History:  Procedure Laterality Date  . AV FISTULA PLACEMENT Right 05/31/2014   Procedure: Right Arm Brachiocephalic ARTERIOVENOUS FISTULA CREATION  ;  Surgeon: Conrad Bartholomew, MD;  Location: [place];  Service: Vascular;  Laterality: Right;  . CATARACT EXTRACTION W/ INTRAOCULAR LENS  IMPLANT, BILATERAL    . COLONOSCOPY    . EXCISION MELANOMA WITH SENTINEL LYMPH NODE BIOPSY Right 02/08/2016   Procedure: WIDE EXCISION RIGHT SHOULDER MELANOMA WITH RIGHT SENTINEL LYMPH NODE BIOPSY;  Surgeon: [name], MD;  Location: [place];  Service: General;  Laterality: Right;  . LAMINECTOMY    . PERMANENT PACEMAKER INSERTION N/A 01/20/2014   STJ Assurity dual chamber pacemaker implanted by Dr [name] for 2nd degree AV block   Family History  Problem Relation Age of Onset  . Pulmonary embolism Mother   . Diabetes Mother   . Heart attack Father   . Diabetes Brother    Social History:  reports that he has never smoked. He has never used smokeless tobacco. He reports that he does not drink alcohol or use drugs.  ROS: As per HPI otherwise negative.  Physical Exam: Vitals:   06/09/17 2215 06/09/17 2257 06/10/17 0411 06/10/17 0920  BP:  (!) 152/76 120/69 112/72  Pulse:  83 85 91 87  Resp: 16 16 16 16   Temp:  97.7 F (36.5 C) 98 F (36.7 C) 98.4 F (36.9 C)  TempSrc:  Oral Oral Oral  SpO2: 99% 100% 100% 100%  Weight:  75.3 kg (166 lb)    Height:  5\' 10"  (1.778 m)       General: Well developed, well nourished, in no acute distress. Scattered skin bruising. Head: Normocephalic, atraumatic, sclera non-icteric, mucus membranes are moist. Neck: Supple without lymphadenopathy/masses.  JVD not elevated. Lungs: Clear bilaterally to auscultation without wheezes, rales, or rhonchi. Breathing is unlabored. Heart: RRR with normal S1, S2. No murmurs, rubs, or gallops appreciated. Abdomen: Soft, non-tender. Lower extremities: No edema or ischemic changes, no open wounds. Neuro: Alert and oriented X 3. Moves all extremities spontaneously. Psych:  Responds to questions appropriately with a normal affect. Dialysis Access: LUE AVF, bandaged but with + bruit, heavy bruising on entire arm.  Allergies  Allergen Reactions  . Amlodipine Besylate Swelling    Angioedema   . Lisinopril Other (See Comments)    Renal insufficiency  . Statins Other (See Comments)    Muscle aches  . Ropinirole Other (See Comments)    Causes excessive grogginess the following morning after taking  . Tape Other (See Comments)    SKIN IS THIN AND TAPE WILL ACTUALLY TEAR IT; PLEASE USE COBAN WRAP INSTEAD!!   Prior to Admission medications   Medication Sig Start Date End Date Taking? Authorizing Provider  acetaminophen (TYLENOL) 500 MG tablet Take 500 mg by mouth every 8 (eight) hours as needed for mild pain. (pain/headaches)   Yes [provider]  aspirin EC 81 MG tablet Take 81 mg by mouth at bedtime.   Yes [provider]  calcium acetate (PHOSLO) 667 MG capsule Take 667-1,334 mg by mouth See admin instructions. Take 2 capsules (1334 mg) by mouth three times daily with meals and 1 capsule (667 mg) with snacks 03/06/17  Yes [provider]  carvedilol (COREG) 3.125 MG tablet Take 1 tablet (3.125 mg total) by mouth 2 (two) times daily with a meal. 02/14/17  Yes Angiulli, Lavon Paganini, PA-C  ezetimibe (ZETIA) 10 MG tablet Take 1 tablet (10 mg total) by mouth daily. Patient taking differently: Take 10 mg by mouth daily with supper.  02/14/17  Yes Angiulli, Lavon Paganini, PA-C  fenofibrate 160 MG tablet Take 1 tablet (160 mg total) by mouth daily. 02/14/17  Yes Angiulli, Lavon Paganini, PA-C  finasteride  (PROSCAR) 5 MG tablet Take 1 tablet (5 mg total) by mouth daily. 02/14/17  Yes Angiulli, Lavon Paganini, PA-C  gabapentin (NEURONTIN) 100 MG capsule Take 1 capsule (100 mg total) by mouth at bedtime. 02/14/17  Yes Angiulli, Lavon Paganini, PA-C  glimepiride (AMARYL) 1 MG tablet Take 1 tablet (1 mg total) by mouth daily with breakfast. 02/14/17  Yes Angiulli, Lavon Paganini, PA-C  Hypromellose (ARTIFICIAL TEARS OP) Place 1 drop into both eyes 3 (three) times daily as needed (for dry eyes).    Yes [provider]  ipratropium (ATROVENT) 0.03 % nasal spray Place 2 sprays into both nostrils daily.    Yes [provider]  multivitamin (RENA-VIT) TABS tablet Take 1 tablet by mouth at bedtime. 02/14/17  Yes Angiulli, Lavon Paganini, PA-C  Nutritional Supplements (ENSURE PO) Take 237 mLs by mouth daily.   Yes [provider]  warfarin (COUMADIN) 1 MG tablet 1 1/2 tab daily Patient taking differently: Take 1.5 mg by mouth daily with supper.  02/14/17  Yes Angiulli, Lavon Paganini, PA-C  Amino Acids-Protein Hydrolys (FEEDING SUPPLEMENT, PRO-STAT SUGAR FREE 64,) LIQD Take 30 mLs by mouth 2 (two) times daily. Patient not taking: Reported on 06/09/2017 03/25/17   Theodis Blaze, MD  famotidine (PEPCID) 40 MG/5ML suspension Take 2.5 mLs (20 mg total) by mouth at bedtime. Patient not taking: Reported on 06/09/2017 03/26/17   Theodis Blaze, MD  ipratropium-albuterol (DUONEB) 0.5-2.5 (3) MG/3ML SOLN Take 3 mLs by nebulization every 6 (six) hours as needed. Patient not taking: Reported on 06/09/2017 03/25/17   Theodis Blaze, MD  LORazepam (ATIVAN) 1 MG tablet Take 1 tablet (1 mg total) by mouth every 6 (six) hours as needed for anxiety. Patient not taking: Reported on 06/09/2017 03/25/17   Theodis Blaze, MD  Nutritional Supplements (FEEDING SUPPLEMENT, NEPRO CARB STEADY,) LIQD Take 237 mLs by mouth 2 (two) times daily between meals. Patient not taking: Reported on 06/09/2017 03/25/17   Theodis Blaze, MD  predniSONE (DELTASONE) 20 MG  tablet Take 1 tablet (20 mg total) by mouth daily with breakfast. Please note to continue taking 20 mg tablet for now  and you must be evaluated by your neurologist (Dr. [name]) to determine next Prednisone dose and frequency. Patient not taking: Reported on 06/09/2017 03/26/17   Bonnielee Haff, MD  traZODone (DESYREL) 50 MG tablet Take 1 tablet (50 mg total) by mouth at bedtime as needed for sleep. Patient not taking: Reported on 06/09/2017 03/25/17   Theodis Blaze, MD   Current Facility-Administered Medications  Medication Dose Route Frequency Provider Last Rate Last Dose  . acetaminophen (TYLENOL) tablet 650 mg  650 mg Oral Q6H PRN Rise Patience, MD   650 mg at 06/10/17 8546   Or  . acetaminophen (TYLENOL) suppository 650 mg  650 mg Rectal Q6H PRN Rise Patience, MD      . calcium acetate (PHOSLO) capsule 1,334 mg  1,334 mg Oral TID WC Rise Patience, MD   1,334 mg at 06/10/17 1303  . calcium acetate (PHOSLO) capsule 667 mg  667 mg Oral With snacks Rise Patience, MD      . ezetimibe (ZETIA) tablet 10 mg  10 mg Oral Q supper Rise Patience, MD      . fenofibrate tablet 160 mg  160 mg Oral Daily Rise Patience, MD   160 mg at 06/10/17 1008  . gabapentin (NEURONTIN) capsule 100 mg  100 mg Oral QHS Rise Patience, MD   100 mg at 06/09/17 2345  . glimepiride (AMARYL) tablet 1 mg  1 mg Oral Q breakfast Rise Patience, MD   1 mg at 06/10/17 1008  . insulin aspart (novoLOG) injection 0-9 Units  0-9 Units Subcutaneous TID WC Rise Patience, MD   5 Units at 06/10/17 1303  . ipratropium (ATROVENT) 0.06 % nasal spray 2 spray  2 spray Each Nare Daily Rise Patience, MD   2 spray at 06/10/17 1013  . multivitamin (RENA-VIT) tablet 1 tablet  1 tablet Oral QHS Rise Patience, MD   1 tablet at 06/09/17 2345  . ondansetron (ZOFRAN) tablet 4 mg  4 mg Oral Q6H PRN Rise Patience, MD       Or  . ondansetron [facility] Xxx) injection 4 mg  4 mg  Intravenous Q6H PRN Rise Patience, MD       Labs: Basic Metabolic Panel:  Recent Labs Lab 06/09/17 1730 06/09/17 1749 06/10/17 0604  NA 128* 128* 133*  K 5.2* 5.1 4.3  CL 91* 94* 97*  CO2 20*  --  17*  GLUCOSE 512* 533* 102*  BUN 100* 100* 110*  CREATININE 9.00* 9.10* 9.54*  CALCIUM 9.7  --  9.3   CBC:  Recent Labs Lab 06/09/17 1730 06/09/17 1749 06/10/17 0604  WBC 12.1*  --  11.6*  NEUTROABS 10.6*  --   --   HGB 11.5* 12.9* 13.3  HCT 36.6* 38.0* 38.5*  MCV 99.2  --  91.2  PLT 385  --  220    Recent Labs Lab 06/09/17 1922 06/09/17 2203 06/09/17 2300 06/10/17 0800 06/10/17 1232  GLUCAP 448* 294* 262* 95 280*   Dialysis Orders:  MWF at [facility] 4[facility], BFR 400, DFR 800, EDW 75.5kg, 2K/2.25Ca bath, Profile 2/linear Na, AVF - No heparin (on warfarin) - Hectoral 63mcg IV q HD - Venofer 50mg  IV weekly - Mircera 12mcg IV q 2 weeks (last given 9/19)  Assessment/Plan: 1.  Prolonged AVF bleeding: Resolved in ED. Vascular surgery evaluated him, no plans for intervention at this time. Felt likely due to elevated INR. Will monitor after HD tomorrow. If bleeding recurs, will push for fistulogram. 2.  ESRD: Continue HD per MWF schedule, next 9/26. No heparin. 3.  Hypertension/volume: BP controlled, no edema. Appears to be at his EDW, low UF goal tomorrow. 4.  Anemia: Hgb 13.3, above goal despite bleeding episode. Monitor. No ESA needed for now. 5.  Metabolic bone disease: Ca ok. Continue Phoslo as binder, resume VDRA with HD. 6.  Myasthenia Gravis: With persistent weakness. Per primary. 7.  A-fib: Warfarin on hold due to high INR on admit, per primary. 8.  Type 2 DM: On glimiperide and insulin, per primary.  Veneta Penton, PA-C 06/10/2017, 3:31 PM  [facility] Pager: [phone]  I have seen and examined this patient and agree with plan and assessment in the above note with renal recommendations/intervention highlighted.  Plan for HD tomorrow with AVF  and no heparin.  If he continues to bleed will ask VVS for fistulogram to r/o venous outflow stenosis.   Governor Rooks Ladaisha Portillo,MD 06/10/2017 3:59 PM

## 2017-06-10 NOTE — Plan of Care (Signed)
Problem: Safety: Goal: Ability to remain free from injury will improve Outcome: Not Progressing Pt very weak when attempting to go from bed to chair. One to two person assist. PT to work with patient.

## 2017-06-11 ENCOUNTER — Encounter (HOSPITAL_COMMUNITY): Payer: Self-pay | Admitting: Vascular Surgery

## 2017-06-11 ENCOUNTER — Encounter (HOSPITAL_COMMUNITY)
Admission: EM | Disposition: A | Discharge status: Home Health | Payer: Self-pay | Source: Home / Self Care | Attending: Family Medicine

## 2017-06-11 DIAGNOSIS — L8990 Pressure ulcer of unspecified site, unspecified stage: Secondary | ICD-10-CM | POA: Insufficient documentation

## 2017-06-11 HISTORY — PX: A/V FISTULAGRAM: CATH118298

## 2017-06-11 HISTORY — PX: PERIPHERAL VASCULAR BALLOON ANGIOPLASTY: CATH118281

## 2017-06-11 LAB — CBC
HCT: 38.4 % — ABNORMAL LOW (ref 39.0–52.0)
HEMOGLOBIN: 12.4 g/dL — AB (ref 13.0–17.0)
MCH: 29.9 pg (ref 26.0–34.0)
MCHC: 32.3 g/dL (ref 30.0–36.0)
MCV: 92.5 fL (ref 78.0–100.0)
Platelets: 221 10*3/uL (ref 150–400)
RBC: 4.15 MIL/uL — AB (ref 4.22–5.81)
RDW: 19.7 % — ABNORMAL HIGH (ref 11.5–15.5)
WBC: 11.3 10*3/uL — AB (ref 4.0–10.5)

## 2017-06-11 LAB — MRSA PCR SCREENING: MRSA BY PCR: NEGATIVE

## 2017-06-11 LAB — RENAL FUNCTION PANEL
Albumin: 2.9 g/dL — ABNORMAL LOW (ref 3.5–5.0)
Anion gap: 19 — ABNORMAL HIGH (ref 5–15)
BUN: 134 mg/dL — AB (ref 6–20)
CALCIUM: 9 mg/dL (ref 8.9–10.3)
CHLORIDE: 94 mmol/L — AB (ref 101–111)
CO2: 17 mmol/L — AB (ref 22–32)
CREATININE: 11 mg/dL — AB (ref 0.61–1.24)
GFR calc Af Amer: 4 mL/min — ABNORMAL LOW (ref >60)
GFR calc non Af Amer: 4 mL/min — ABNORMAL LOW (ref >60)
Glucose, Bld: 138 mg/dL — ABNORMAL HIGH (ref 65–99)
PHOSPHORUS: 8.9 mg/dL — AB (ref 2.5–4.6)
Potassium: 4.4 mmol/L (ref 3.5–5.1)
Sodium: 130 mmol/L — ABNORMAL LOW (ref 135–145)

## 2017-06-11 LAB — PREPARE FRESH FROZEN PLASMA: UNIT DIVISION: 0

## 2017-06-11 LAB — BPAM FFP
BLOOD PRODUCT EXPIRATION DATE: 201809292359
ISSUE DATE / TIME: 201809241815
Unit Type and Rh: 7300

## 2017-06-11 LAB — GLUCOSE, CAPILLARY
GLUCOSE-CAPILLARY: 114 mg/dL — AB (ref 65–99)
GLUCOSE-CAPILLARY: 124 mg/dL — AB (ref 65–99)
Glucose-Capillary: 146 mg/dL — ABNORMAL HIGH (ref 65–99)
Glucose-Capillary: 208 mg/dL — ABNORMAL HIGH (ref 65–99)

## 2017-06-11 LAB — PROTIME-INR
INR: 2.57
PROTHROMBIN TIME: 27.4 s — AB (ref 11.4–15.2)

## 2017-06-11 SURGERY — A/V FISTULAGRAM
Anesthesia: LOCAL | Laterality: Right

## 2017-06-11 MED ORDER — HEPARIN (PORCINE) IN NACL 2-0.9 UNIT/ML-% IJ SOLN
INTRAMUSCULAR | Status: AC
Start: 2017-06-11 — End: 2017-06-11
  Filled 2017-06-11: qty 500

## 2017-06-11 MED ORDER — LIDOCAINE HCL (PF) 1 % IJ SOLN
INTRAMUSCULAR | Status: DC | PRN
  Administered 2017-06-11: 20 mL

## 2017-06-11 MED ORDER — HEPARIN (PORCINE) IN NACL 2-0.9 UNIT/ML-% IJ SOLN
INTRAMUSCULAR | Status: AC | PRN
  Administered 2017-06-11: 500 mL

## 2017-06-11 MED ORDER — LIDOCAINE HCL 2 % IJ SOLN
INTRAMUSCULAR | Status: AC
  Filled 2017-06-11: qty 10

## 2017-06-11 MED ORDER — IODIXANOL 320 MG/ML IV SOLN
INTRAVENOUS | Status: DC | PRN
  Administered 2017-06-11: 55 mL via INTRAVENOUS

## 2017-06-11 SURGICAL SUPPLY — 24 items
BAG SNAP BAND KOVER 36X36 (MISCELLANEOUS) ×3 IMPLANT
BALLN LUTONIX AV 7X40X75 (BALLOONS) ×3
BALLN LUTONIX AV 7X60X75 (BALLOONS) ×3
BALLN MUSTANG 5X60X75 (BALLOONS) ×3
BALLN MUSTANG 8X60X75 (BALLOONS) ×3
BALLOON LUTONIX AV 7X40X75 (BALLOONS) ×2 IMPLANT
BALLOON LUTONIX AV 7X60X75 (BALLOONS) ×2 IMPLANT
BALLOON MUSTANG 5X60X75 (BALLOONS) ×2 IMPLANT
BALLOON MUSTANG 8X60X75 (BALLOONS) ×2 IMPLANT
CATH ANGIO 5F BER2 65CM (CATHETERS) ×3 IMPLANT
COVER DOME SNAP 22 D (MISCELLANEOUS) ×3 IMPLANT
COVER PRB 48X5XTLSCP FOLD TPE (BAG) ×2 IMPLANT
COVER PROBE 5X48 (BAG) ×1
GUIDEWIRE ANGLED .035X150CM (WIRE) ×3 IMPLANT
KIT ENCORE 26 ADVANTAGE (KITS) ×6 IMPLANT
KIT MICROINTRODUCER STIFF 5F (SHEATH) ×3 IMPLANT
PROTECTION STATION PRESSURIZED (MISCELLANEOUS) ×3
SHEATH PINNACLE R/O II 6F 4CM (SHEATH) ×3 IMPLANT
STATION PROTECTION PRESSURIZED (MISCELLANEOUS) ×2 IMPLANT
STOPCOCK MORSE 400PSI 3WAY (MISCELLANEOUS) ×6 IMPLANT
TRAY PV CATH (CUSTOM PROCEDURE TRAY) ×3 IMPLANT
TUBING CIL FLEX 10 FLL-RA (TUBING) ×6 IMPLANT
WIRE J 3MM .035X145CM (WIRE) ×3 IMPLANT
WIRE ROSEN-J .035X180CM (WIRE) ×3 IMPLANT

## 2017-06-11 NOTE — Op Note (Signed)
    Patient name: James David MRN: [id number] DOB: October 25, 1935 Sex: male  06/11/2017 Pre-operative Diagnosis: esrd, malfunctioning right arm avf Post-operative diagnosis:  Same Surgeon:  Eda Paschal. Donzetta Matters, MD Procedure Performed: 1.  US guided cannulation of right arm av fistula 2.  Fistulogram with central venogram 3.  Balloon angioplasty of in stent stenosis with 52mm balloon 4.  Drug coated balloon angioplasty of right arm cephalic vein av fistula with 7 x 60 and 7 x 39mm lutonix  Indications:  81 year old male with a history of a right arm cephalic vein fistula who has had difficulty with bleeding on dialysis and now fistula would not work today and is indicated for fistulogram.  Findings: Fistula had a greater than 90% stenosis in the upper arm. There was a greater than 50% stenosis through the in-stent of the cephalic arch. Centrally there arepacemaker wires and likely some stenosis however does not appear to be flow limiting.   Procedure:  The patient was identified in the holding area and taken to room 8.  The patient was then placed supine on the table and prepped and draped in the usual sterile fashion.  A time out was called.  Ultrasound was used to evaluate the right arm cephalic vein fistula and local anesthesia was injected. We then cannulated this with micropuncture needle followed by wire and sheath. Right arm fistulogram followed by central venogram was performed with the above findings. We then placed a J-wire and a 6 French sheath. Using a Glidewire and bare catheter we were able to navigate be very tight stenoses. We were intraluminal and then advanced the catheter into the IVC were removed exchanged for a long stiff wire. We then performed 58mm balloon angioplasty of the in-stent stenosis as well as the central vein just beyond that. We then predilated the cephalic vein with a 5 mm balloon followed by 7 mm drug-coated balloon angioplasty 2 of the cephalic vein. Completion  demonstrated residual dissection and there was no pulsatility in the fistula. We then used the 8 mm balloon at low pressure throughout the cephalic vein and this resolved the residual dissection and there was now a thrill in the fistula. Satisfied with this we removed the wire a figure-of-eight stitch was placed and pressure held and the sheath removed. Patient tolerated procedure well without immediate complication.   Contrast: 55cc   Shaina Gullatt C. Donzetta Matters, MD [facility] [place] Office: [phone] Pager: [phone]

## 2017-06-11 NOTE — Progress Notes (Signed)
Continued bleeding from fistula.  Coags pending  Will plan for fistulogram today to evaluate for stenosis  Keep patient NPO   James David

## 2017-06-11 NOTE — Progress Notes (Signed)
  Progress Note    Patient doing well post procedure without complaint. Palpable thrill in avf. Ok for hd via avf. Please call with further questions.   Yedidya Duddy C. Donzetta Matters, MD [facility] [place] Office: [phone] Pager: [phone]

## 2017-06-11 NOTE — Progress Notes (Signed)
Patient ID: James David, male   DOB: 05-Dec-1935, 81 y.o.   MRN: [id number]  [facility] Progress Note    Subjective:   NO new complaints but started bleeding from AVF prior to inserting first needle and unable to stop bleeding around arterial needle   Objective:   BP 130/62 (BP Location: Left Arm)   Pulse 82   Temp 98.4 F (36.9 C) (Oral)   Resp 18   Ht 5\' 10"  (1.778 m)   Wt 76.5 kg (168 lb 11.2 oz)   SpO2 100%   BMI 24.21 kg/m   Intake/Output: I/O last 3 completed shifts: In: 360 [P.O.:360] Out: 2 [Urine:1; Stool:1]   Intake/Output this shift:  No intake/output data recorded. Weight change: 0.907 kg (2 lb)  Physical Exam: Gen:WN elderly man, frail and fatigued CVS:no rub Resp:cta URK:YHCWCB Ext: RUE AVF bleeding from previous needle site and current arterial needle as well as ecchymoses.  Labs: BMET  Recent Labs Lab 06/09/17 1730 06/09/17 1749 06/10/17 0604 06/11/17 0331  NA 128* 128* 133* 130*  K 5.2* 5.1 4.3 4.4  CL 91* 94* 97* 94*  CO2 20*  --  17* 17*  GLUCOSE 512* 533* 102* 138*  BUN 100* 100* 110* 134*  CREATININE 9.00* 9.10* 9.54* 11.00*  ALBUMIN  --   --   --  2.9*  CALCIUM 9.7  --  9.3 9.0  PHOS  --   --   --  8.9*   CBC  Recent Labs Lab 06/09/17 1730 06/09/17 1749 06/10/17 0604 06/11/17 0630  WBC 12.1*  --  11.6* 11.3*  NEUTROABS 10.6*  --   --   --   HGB 11.5* 12.9* 13.3 12.4*  HCT 36.6* 38.0* 38.5* 38.4*  MCV 99.2  --  91.2 92.5  PLT 385  --  220 221    @IMGRELPRIORS @ Medications:    . calcium acetate  1,334 mg Oral TID WC  . calcium acetate  667 mg Oral With snacks  . doxercalciferol  4 mcg Intravenous Q M,W,F-HD  . ezetimibe  10 mg Oral Q supper  . fenofibrate  160 mg Oral Daily  . gabapentin  100 mg Oral QHS  . glimepiride  1 mg Oral Q breakfast  . insulin aspart  0-9 Units Subcutaneous TID WC  . ipratropium  2 spray Each Nare Daily  . multivitamin  1 tablet Oral QHS   Dialysis Orders:  MWF at  [facility] 4hr, BFR 400, DFR 800, EDW 75.5kg, 2K/2.25Ca bath, Profile 2/linear Na, AVF - No heparin (on warfarin) - Hectoral 82mcg IV q HD - Venofer 50mg  IV weekly - Mircera 167mcg IV q 2 weeks (last given 9/19)  Assessment/ Plan:   1.  Prolonged AVF bleeding: Resolved in ED. Vascular surgery evaluated him, no plans for intervention at this time. Felt likely due to elevated INR and thin skin, however he started to bleed immediately after needle was inserted today at HD and was bleeding from previous needle sites.  AVF pulsatile and concerning for venous outflow stenosis.  Will contact James David and he can decide upon pursuing fistulogram or consulting IR for fistulogram.  Recheck INR. 2.  ESRD: Continue HD per MWF 3.  Hypertension/volume: BP controlled, no edema. Appears to be at his EDW, low UF goal tomorrow. 4.  Anemia: Hgb 13.3, above goal despite bleeding episode. Monitor. No ESA needed for now. 5.  Metabolic bone disease: Ca ok. Continue Phoslo as binder, resume VDRA with HD. 6.  Myasthenia  Gravis: With persistent weakness. Per primary. 7.  A-fib: Warfarin on hold due to high INR on admit, per primary. 8.  Type 2 DM: On glimiperide and insulin, per primary. 9. Disposition- may require SNF placement due to weakness and decreased functional status.  James Potts, MD [place] Pager [phone] 06/11/2017, 8:38 AM

## 2017-06-11 NOTE — Care Management Note (Signed)
Case Management Note  Patient Details  Name: James David MRN: [id number] Date of Birth: 1936-06-03  Subjective/Objective:                 presented to the ER after patient had persistent bleeding from the AV fistula site after dialysis. Access replaced 9/26.    Action/Plan:  CM will continue to follow for DC planning  Expected Discharge Date:  06/10/17               Expected Discharge Plan:  [place]  In-House Referral:     Discharge planning Services  CM Consult  Post Acute Care Choice:    Choice offered to:     DME Arranged:    DME Agency:     HH Arranged:    HH Agency:     Status of Service:  In process, will continue to follow  If discussed at Long Length of Stay Meetings, dates discussed:    Additional Comments:  Carles Collet, RN 06/11/2017, 4:04 PM

## 2017-06-11 NOTE — Progress Notes (Signed)
[facility] PROGRESS NOTE  James David NTI:[id number] DOB: September 02, 1936 DOA: 06/09/2017 PCP: [name], MD  Assessment/Plan: 1. Bleeding AV fistula with early shock - improved with fluid resuscitation and PRBC transfusion. Bleeding stopped after pressure dressing. Per vascular should be fine and needs 48hrs of dressing. Hold Coumadin. Vasc surg has cleared graft post procedure for use for HD 2. Diabetes mellitus type 2 uncontrolled with hyperglycemia -  sliding scale coverage. 3. History of myasthenia gravis and had recent crisis - I do not see any home medications for myasthenia gravis. Need to check with patient's pharmacy in morning. 4. History of embolic stroke recently - holding Coumadin. Patient agreeable. Follow INR. 5. Acute blood loss anemia - patient received a PRBC. Follow CBC. Follow respiratory status. 6. ESRD on hemodialysis on Monday Wednesday and Friday - had dialysis yesterday. Patient did receive fluids and PRBC the ER. Monitor respiratory status. 7. Paroxysmal atrial fibrillation - holding Coumadin due to bleeding fistula were on Coreg. 8. History of COPD presently not wheezing. 9. History of CAD denies any chest pain.   Consultants:  vascular  Procedures:  n/a  Antibiotics:  none (indicate start date, and stop date if known)  HPI/Subjective: No bleeding. Hungry.  Objective: Vitals:   06/11/17 1132 06/11/17 1137  BP:    Pulse: (!) 0 (!) 0  Resp: (!) 0 (!) 0  Temp:    SpO2: (!) 0% (!) 0%  Above is in error.  Intake/Output Summary (Last 24 hours) at 06/11/17 1650 Last data filed at 06/11/17 1330  Gross per 24 hour  Intake              340 ml  Output                2 ml  Net              338 ml   Filed Weights   06/09/17 2206 06/09/17 2257 06/10/17 2058  Weight: 75.6 kg (166 lb 11.2 oz) 75.3 kg (166 lb) 76.5 kg (168 lb 11.2 oz)    Exam:   General:  NAD, NCAT  Cardiovascular: RRR, no MRG  Respiratory: CTAB, no MRG  Abdomen:  ND, NTTP  AV fistula with palpable thrill, dressing on it is clean dry and intact  Data Reviewed: Basic Metabolic Panel:  Recent Labs Lab 06/09/17 1730 06/09/17 1749 06/10/17 0604 06/11/17 0331  NA 128* 128* 133* 130*  K 5.2* 5.1 4.3 4.4  CL 91* 94* 97* 94*  CO2 20*  --  17* 17*  GLUCOSE 512* 533* 102* 138*  BUN 100* 100* 110* 134*  CREATININE 9.00* 9.10* 9.54* 11.00*  CALCIUM 9.7  --  9.3 9.0  PHOS  --   --   --  8.9*   Liver Function Tests:  Recent Labs Lab 06/11/17 0331  ALBUMIN 2.9*   No results for input(s): LIPASE, AMYLASE in the last 168 hours. No results for input(s): AMMONIA in the last 168 hours. CBC:  Recent Labs Lab 06/09/17 1730 06/09/17 1749 06/10/17 0604 06/11/17 0630  WBC 12.1*  --  11.6* 11.3*  NEUTROABS 10.6*  --   --   --   HGB 11.5* 12.9* 13.3 12.4*  HCT 36.6* 38.0* 38.5* 38.4*  MCV 99.2  --  91.2 92.5  PLT 385  --  220 221   Cardiac Enzymes: No results for input(s): CKTOTAL, CKMB, CKMBINDEX, TROPONINI in the last 168 hours. BNP (last 3 results)  Recent Labs  03/17/17 1304  BNP 479.7*    ProBNP (last 3 results) No results for input(s): PROBNP in the last 8760 hours.  CBG:  Recent Labs Lab 06/10/17 1232 06/10/17 1725 06/10/17 2058 06/11/17 0937 06/11/17 1209  GLUCAP 280* 281* 216* 114* 124*    Recent Results (from the past 240 hour(s))  MRSA PCR Screening     Status: None   Collection Time: 06/11/17  6:47 AM  Result Value Ref Range Status   MRSA by PCR NEGATIVE NEGATIVE Final    Comment:        The GeneXpert MRSA Assay (FDA approved for NASAL specimens only), is one component of a comprehensive MRSA colonization surveillance program. It is not intended to diagnose MRSA infection nor to guide or monitor treatment for MRSA infections.      Studies: No results found.  Scheduled Meds: . calcium acetate  1,334 mg Oral TID WC  . calcium acetate  667 mg Oral With snacks  . doxercalciferol  4 mcg Intravenous Q  M,W,F-HD  . ezetimibe  10 mg Oral Q supper  . fenofibrate  160 mg Oral Daily  . gabapentin  100 mg Oral QHS  . glimepiride  1 mg Oral Q breakfast  . insulin aspart  0-9 Units Subcutaneous TID WC  . ipratropium  2 spray Each Nare Daily  . multivitamin  1 tablet Oral QHS   Continuous Infusions:  Principal Problem:   Bleeding at insertion site Active Problems:   Diabetes mellitus due to underlying condition with diabetic nephropathy (HCC)   Chronic diastolic heart failure (HCC)   History of CVA in adulthood   Atrial fibrillation, permanent ([place])   HTN (hypertension)   ESRD on dialysis ([place])   Myasthenia gravis ([place])   Pressure injury of skin    Time spent: [place]. If 7PM-7AM, please contact night-coverage at [email or web address], password [facility] 06/11/2017, 4:50 PM  LOS: 2 days

## 2017-06-12 LAB — CBC
HEMATOCRIT: 33.9 % — AB (ref 39.0–52.0)
HEMOGLOBIN: 11.2 g/dL — AB (ref 13.0–17.0)
MCH: 30.3 pg (ref 26.0–34.0)
MCHC: 33 g/dL (ref 30.0–36.0)
MCV: 91.6 fL (ref 78.0–100.0)
PLATELETS: 222 10*3/uL (ref 150–400)
RBC: 3.7 MIL/uL — AB (ref 4.22–5.81)
RDW: 19.3 % — ABNORMAL HIGH (ref 11.5–15.5)
WBC: 11.7 10*3/uL — AB (ref 4.0–10.5)

## 2017-06-12 LAB — HEPATITIS B SURFACE ANTIGEN: Hepatitis B Surface Ag: NEGATIVE

## 2017-06-12 LAB — RENAL FUNCTION PANEL
ANION GAP: 15 (ref 5–15)
Albumin: 2.9 g/dL — ABNORMAL LOW (ref 3.5–5.0)
BUN: 147 mg/dL — ABNORMAL HIGH (ref 6–20)
CHLORIDE: 94 mmol/L — AB (ref 101–111)
CO2: 19 mmol/L — AB (ref 22–32)
CREATININE: 12.08 mg/dL — AB (ref 0.61–1.24)
Calcium: 9.1 mg/dL (ref 8.9–10.3)
GFR, EST AFRICAN AMERICAN: 4 mL/min — AB (ref >60)
GFR, EST NON AFRICAN AMERICAN: 3 mL/min — AB (ref >60)
Glucose, Bld: 142 mg/dL — ABNORMAL HIGH (ref 65–99)
POTASSIUM: 4.4 mmol/L (ref 3.5–5.1)
Phosphorus: 9.1 mg/dL — ABNORMAL HIGH (ref 2.5–4.6)
Sodium: 128 mmol/L — ABNORMAL LOW (ref 135–145)

## 2017-06-12 LAB — GLUCOSE, CAPILLARY
GLUCOSE-CAPILLARY: 392 mg/dL — AB (ref 65–99)
GLUCOSE-CAPILLARY: 233 mg/dL — AB (ref 65–99)
Glucose-Capillary: 117 mg/dL — ABNORMAL HIGH (ref 65–99)

## 2017-06-12 LAB — PROTIME-INR
INR: 2.44
Prothrombin Time: 26.3 seconds — ABNORMAL HIGH (ref 11.4–15.2)

## 2017-06-12 MED ORDER — SODIUM CHLORIDE 0.9 % IV SOLN: 100.0000 mL | INTRAVENOUS | Status: DC | PRN

## 2017-06-12 MED ORDER — HEPARIN SODIUM (PORCINE) 1000 UNIT/ML DIALYSIS: 1000.0000 [IU] | INTRAMUSCULAR | Status: DC | PRN

## 2017-06-12 MED ORDER — LIDOCAINE-PRILOCAINE 2.5-2.5 % EX CREA: 1.0000 "application " | TOPICAL_CREAM | CUTANEOUS | Status: DC | PRN

## 2017-06-12 MED ORDER — LANTHANUM CARBONATE 500 MG PO CHEW
1000.0000 mg | CHEWABLE_TABLET | Freq: Three times a day (TID) | ORAL | Status: DC
  Administered 2017-06-12 – 2017-06-13 (×4): 1000 mg via ORAL
  Filled 2017-06-12 (×6): qty 2

## 2017-06-12 MED ORDER — LIDOCAINE HCL (PF) 1 % IJ SOLN: 5.0000 mL | INTRAMUSCULAR | Status: DC | PRN

## 2017-06-12 MED ORDER — ACETAMINOPHEN 325 MG PO TABS
ORAL_TABLET | ORAL | Status: AC
  Filled 2017-06-12: qty 2

## 2017-06-12 MED ORDER — ALBUMIN HUMAN 25 % IV SOLN
12.5000 g | Freq: Once | INTRAVENOUS | Status: AC
  Administered 2017-06-12: 12.5 g via INTRAVENOUS

## 2017-06-12 MED ORDER — PENTAFLUOROPROP-TETRAFLUOROETH EX AERO: 1.0000 "application " | INHALATION_SPRAY | CUTANEOUS | Status: DC | PRN

## 2017-06-12 MED ORDER — ALBUMIN HUMAN 25 % IV SOLN
INTRAVENOUS | Status: AC
  Filled 2017-06-12: qty 50

## 2017-06-12 MED ORDER — ALTEPLASE 2 MG IJ SOLR: 2.0000 mg | Freq: Once | INTRAMUSCULAR | Status: DC | PRN

## 2017-06-12 MED ORDER — LANTHANUM CARBONATE 500 MG PO CHEW
1000.0000 mg | CHEWABLE_TABLET | ORAL | Status: DC
  Filled 2017-06-12 (×3): qty 2

## 2017-06-12 NOTE — Progress Notes (Signed)
[facility] PROGRESS NOTE  DAION GINSBERG CNO:[id number] DOB: 09-Jul-1936 DOA: 06/09/2017 PCP: [name], MD  Assessment/Plan: 1. Bleeding AV fistula with early shock - improved with fluid resuscitation and PRBC transfusion. Bleeding stopped after pressure dressing. Likely due to Coumadin. Patient has fistulogram was left as a stenosis. Procedure done by Dr. Lonn Georgia vascular. Now resolved. Successful HD 9/27. Discharge tomorrow after dialysis. 2. Diabetes mellitus type 2 uncontrolled with hyperglycemia -  SSI 3. History of myasthenia gravis and had recent crisis - I do not see any home medications for myasthenia gravis. Need to check with patient's pharmacy in morning. 4. History of embolic stroke recently - holding Coumadin. Patient agreeable. Follow INR. 5. Acute blood loss anemia - patient received a PRBC. Follow CBC. Follow respiratory status. 6. ESRD on hemodialysis on Monday Wednesday and Friday - had dialysis yesterday. Patient did receive fluids and PRBC the ER. Monitor respiratory status. 7. Paroxysmal atrial fibrillation - holding Coumadin due to bleeding fistula were on Coreg. 8. History of COPD presently not wheezing. 9. History of CAD denies any chest pain.   Consultants:  vascular  Procedures:  n/a  Antibiotics:  none (indicate start date, and stop date if known)  HPI/Subjective: No bleeding still. Hungry. Successful HD.  Objective: Vitals:   06/12/17 0930 06/12/17 1207  BP: 100/60 115/64  Pulse: (!) 112 (!) 105  Resp: 19 (!) 21  Temp:  (!) 97.5 F (36.4 C)  SpO2:  95%  Above is in error.  Intake/Output Summary (Last 24 hours) at 06/12/17 1545 Last data filed at 06/12/17 1400  Gross per 24 hour  Intake              240 ml  Output             1500 ml  Net            -1260 ml   Filed Weights   06/11/17 2035 06/12/17 0755 06/12/17 1207  Weight: 76.2 kg (168 lb) 75.3 kg (166 lb 0.1 oz) 73.8 kg (162 lb 11.2 oz)    Exam:   General:  NAD,  NCAT  Cardiovascular: RRR, no MRG  Respiratory: CTAB, no MRG  Abdomen: ND, NTTP  AV fistula with palpable thrill, dressing on it is clean dry and intact  Data Reviewed: Basic Metabolic Panel:  Recent Labs Lab 06/09/17 1730 06/09/17 1749 06/10/17 0604 06/11/17 0331 06/12/17 0252  NA 128* 128* 133* 130* 128*  K 5.2* 5.1 4.3 4.4 4.4  CL 91* 94* 97* 94* 94*  CO2 20*  --  17* 17* 19*  GLUCOSE 512* 533* 102* 138* 142*  BUN 100* 100* 110* 134* 147*  CREATININE 9.00* 9.10* 9.54* 11.00* 12.08*  CALCIUM 9.7  --  9.3 9.0 9.1  PHOS  --   --   --  8.9* 9.1*   Liver Function Tests:  Recent Labs Lab 06/11/17 0331 06/12/17 0252  ALBUMIN 2.9* 2.9*   No results for input(s): LIPASE, AMYLASE in the last 168 hours. No results for input(s): AMMONIA in the last 168 hours. CBC:  Recent Labs Lab 06/09/17 1730 06/09/17 1749 06/10/17 0604 06/11/17 0630 06/12/17 0252  WBC 12.1*  --  11.6* 11.3* 11.7*  NEUTROABS 10.6*  --   --   --   --   HGB 11.5* 12.9* 13.3 12.4* 11.2*  HCT 36.6* 38.0* 38.5* 38.4* 33.9*  MCV 99.2  --  91.2 92.5 91.6  PLT 385  --  220 221 222  Cardiac Enzymes: No results for input(s): CKTOTAL, CKMB, CKMBINDEX, TROPONINI in the last 168 hours. BNP (last 3 results)  Recent Labs  03/17/17 1304  BNP 479.7*    ProBNP (last 3 results) No results for input(s): PROBNP in the last 8760 hours.  CBG:  Recent Labs Lab 06/11/17 0937 06/11/17 1209 06/11/17 1650 06/11/17 2028 06/12/17 1322  GLUCAP 114* 124* 146* 208* 117*    Recent Results (from the past 240 hour(s))  MRSA PCR Screening     Status: None   Collection Time: 06/11/17  6:47 AM  Result Value Ref Range Status   MRSA by PCR NEGATIVE NEGATIVE Final    Comment:        The GeneXpert MRSA Assay (FDA approved for NASAL specimens only), is one component of a comprehensive MRSA colonization surveillance program. It is not intended to diagnose MRSA infection nor to guide or monitor treatment  for MRSA infections.      Studies: No results found.  Scheduled Meds: . doxercalciferol  4 mcg Intravenous Q M,W,F-HD  . ezetimibe  10 mg Oral Q supper  . fenofibrate  160 mg Oral Daily  . gabapentin  100 mg Oral QHS  . glimepiride  1 mg Oral Q breakfast  . insulin aspart  0-9 Units Subcutaneous TID WC  . ipratropium  2 spray Each Nare Daily  . lanthanum  1,000 mg Oral TID WC  . lanthanum  1,000 mg Oral With snacks  . multivitamin  1 tablet Oral QHS   Continuous Infusions:  Principal Problem:   Bleeding at insertion site Active Problems:   Diabetes mellitus due to underlying condition with diabetic nephropathy (HCC)   Chronic diastolic heart failure (HCC)   History of CVA in adulthood   Atrial fibrillation, permanent ([place])   HTN (hypertension)   ESRD on dialysis ([place])   Myasthenia gravis ([place])   Pressure injury of skin    Time spent: [place]. If 7PM-7AM, please contact night-coverage at [email or web address], password [facility] 06/12/2017, 3:45 PM  LOS: 3 days

## 2017-06-12 NOTE — Procedures (Signed)
I was present at this dialysis session. I have reviewed the session itself and made appropriate changes.   Filed Weights   06/10/17 2058 06/11/17 2035 06/12/17 0755  Weight: 76.5 kg (168 lb 11.2 oz) 76.2 kg (168 lb) 75.3 kg (166 lb 0.1 oz)     Recent Labs Lab 06/12/17 0252  NA 128*  K 4.4  CL 94*  CO2 19*  GLUCOSE 142*  BUN 147*  CREATININE 12.08*  CALCIUM 9.1  PHOS 9.1*     Recent Labs Lab 06/09/17 1730  06/10/17 0604 06/11/17 0630 06/12/17 0252  WBC 12.1*  --  11.6* 11.3* 11.7*  NEUTROABS 10.6*  --   --   --   --   HGB 11.5*  < > 13.3 12.4* 11.2*  HCT 36.6*  < > 38.5* 38.4* 33.9*  MCV 99.2  --  91.2 92.5 91.6  PLT 385  --  220 221 222  < > = values in this interval not displayed.  Scheduled Meds: . calcium acetate  1,334 mg Oral TID WC  . calcium acetate  667 mg Oral With snacks  . doxercalciferol  4 mcg Intravenous Q M,W,F-HD  . ezetimibe  10 mg Oral Q supper  . fenofibrate  160 mg Oral Daily  . gabapentin  100 mg Oral QHS  . glimepiride  1 mg Oral Q breakfast  . insulin aspart  0-9 Units Subcutaneous TID WC  . ipratropium  2 spray Each Nare Daily  . multivitamin  1 tablet Oral QHS   Continuous Infusions: . sodium chloride    . sodium chloride    . albumin human     PRN Meds:.sodium chloride, sodium chloride, acetaminophen **OR** acetaminophen, alteplase, heparin, lidocaine (PF), lidocaine-prilocaine, ondansetron **OR** ondansetron (ZOFRAN) IV, pentafluoroprop-tetrafluoroeth    Dialysis Orders:  MWF at [facility] 4hr, BFR 400, DFR 800, EDW 75.5kg, 2K/2.25Ca bath, Profile 2/linear Na, AVF - No heparin (on warfarin) - Hectoral 71mcg IV q HD - Venofer 50mg  IV weekly - Mircera 193mcg IV q 2 weeks (last given 9/19)  Assessment/Plan: 1.  Prolonged AVF bleeding: Resolved in ED. Vascular surgery evaluated and felt likely due to elevated INR, however he bled with HD and then sent for fistulogram which revealed a severe stenosis in the venous outflow.  Doing better  with HD today. 2.  ESRD: Continue HD per MWF schedule, next 9/26. No heparin. 3.  Hypertension/volume: BP controlled, no edema. Appears to be at his EDW, low UF goal tomorrow. 4.  Anemia: Hgb 13.3, above goal despite bleeding episode. Monitor. No ESA needed for now. 5.  Metabolic bone disease: Ca elevated with stop phoslo and start fosrenol 1 gram qac and with snacks.  Hold Vitamin D for now due to ^ Ca 6.  Myasthenia Gravis: With persistent weakness. Per primary. 7.  A-fib: Warfarin on hold due to high INR on admit, per primary. 8.  Type 2 DM: On glimiperide and insulin, per primary. 9. Disposition- possible discharge today after HD   James Potts,  MD 06/12/2017, 9:21 AM

## 2017-06-13 DIAGNOSIS — Z992 Dependence on renal dialysis: Secondary | ICD-10-CM

## 2017-06-13 DIAGNOSIS — N186 End stage renal disease: Secondary | ICD-10-CM

## 2017-06-13 LAB — RENAL FUNCTION PANEL
ALBUMIN: 3 g/dL — AB (ref 3.5–5.0)
ANION GAP: 13 (ref 5–15)
BUN: 68 mg/dL — ABNORMAL HIGH (ref 6–20)
CALCIUM: 8.8 mg/dL — AB (ref 8.9–10.3)
CO2: 21 mmol/L — AB (ref 22–32)
Chloride: 97 mmol/L — ABNORMAL LOW (ref 101–111)
Creatinine, Ser: 8.08 mg/dL — ABNORMAL HIGH (ref 0.61–1.24)
GFR calc non Af Amer: 6 mL/min — ABNORMAL LOW (ref >60)
GFR, EST AFRICAN AMERICAN: 6 mL/min — AB (ref >60)
Glucose, Bld: 125 mg/dL — ABNORMAL HIGH (ref 65–99)
PHOSPHORUS: 6.5 mg/dL — AB (ref 2.5–4.6)
Potassium: 4 mmol/L (ref 3.5–5.1)
SODIUM: 131 mmol/L — AB (ref 135–145)

## 2017-06-13 LAB — CBC WITH DIFFERENTIAL/PLATELET
BASOS ABS: 0 10*3/uL (ref 0.0–0.1)
BASOS PCT: 0 %
Eosinophils Absolute: 0.1 10*3/uL (ref 0.0–0.7)
Eosinophils Relative: 1 %
HEMATOCRIT: 34.1 % — AB (ref 39.0–52.0)
Hemoglobin: 11.2 g/dL — ABNORMAL LOW (ref 13.0–17.0)
Lymphocytes Relative: 19 %
Lymphs Abs: 1.9 10*3/uL (ref 0.7–4.0)
MCH: 30.4 pg (ref 26.0–34.0)
MCHC: 32.8 g/dL (ref 30.0–36.0)
MCV: 92.7 fL (ref 78.0–100.0)
Monocytes Absolute: 0.8 10*3/uL (ref 0.1–1.0)
Monocytes Relative: 8 %
NEUTROS ABS: 7.2 10*3/uL (ref 1.7–7.7)
NEUTROS PCT: 72 %
Platelets: 210 10*3/uL (ref 150–400)
RBC: 3.68 MIL/uL — ABNORMAL LOW (ref 4.22–5.81)
RDW: 19.6 % — ABNORMAL HIGH (ref 11.5–15.5)
WBC: 9.9 10*3/uL (ref 4.0–10.5)

## 2017-06-13 LAB — GLUCOSE, CAPILLARY
GLUCOSE-CAPILLARY: 212 mg/dL — AB (ref 65–99)
Glucose-Capillary: 150 mg/dL — ABNORMAL HIGH (ref 65–99)
Glucose-Capillary: 118 mg/dL — ABNORMAL HIGH (ref 65–99)

## 2017-06-13 MED ORDER — LANTHANUM CARBONATE 1000 MG PO CHEW: 1000.0000 mg | CHEWABLE_TABLET | Freq: Three times a day (TID) | ORAL | 0 refills | Status: AC

## 2017-06-13 MED ORDER — LANTHANUM CARBONATE 1000 MG PO CHEW: 1000.0000 mg | CHEWABLE_TABLET | ORAL | 0 refills | Status: AC

## 2017-06-13 MED ORDER — DOXERCALCIFEROL 4 MCG/2ML IV SOLN
INTRAVENOUS | Status: AC
  Filled 2017-06-13: qty 2

## 2017-06-13 MED ORDER — PENTAFLUOROPROP-TETRAFLUOROETH EX AERO: 1.0000 "application " | INHALATION_SPRAY | CUTANEOUS | Status: DC | PRN

## 2017-06-13 MED ORDER — SODIUM CHLORIDE 0.9 % IV SOLN: 100.0000 mL | INTRAVENOUS | Status: DC | PRN

## 2017-06-13 MED ORDER — HEPARIN SODIUM (PORCINE) 1000 UNIT/ML DIALYSIS: 1000.0000 [IU] | INTRAMUSCULAR | Status: DC | PRN

## 2017-06-13 MED ORDER — ALTEPLASE 2 MG IJ SOLR: 2.0000 mg | Freq: Once | INTRAMUSCULAR | Status: DC | PRN

## 2017-06-13 MED ORDER — LIDOCAINE-PRILOCAINE 2.5-2.5 % EX CREA: 1.0000 "application " | TOPICAL_CREAM | CUTANEOUS | Status: DC | PRN

## 2017-06-13 MED ORDER — ACETAMINOPHEN 325 MG PO TABS
ORAL_TABLET | ORAL | Status: AC
  Filled 2017-06-13: qty 2

## 2017-06-13 MED ORDER — LIDOCAINE HCL (PF) 1 % IJ SOLN: 5.0000 mL | INTRAMUSCULAR | Status: DC | PRN

## 2017-06-13 NOTE — Care Management Important Message (Signed)
Important Message  Patient Details  Name: James David MRN: [id number] Date of Birth: 04-26-1936   Medicare Important Message Given:  Yes    Nathen May 06/13/2017, 1:53 PM

## 2017-06-13 NOTE — Discharge Summary (Signed)
Physician Discharge Summary  James David JQB:[id number] DOB: March 17, 1936 DOA: 06/09/2017  PCP: [name], MD  Admit date: 06/09/2017 Discharge date: 06/13/2017  Time spent: 35 minutes  Recommendations for Outpatient Follow-up:  1. F/u with nephro   Discharge Diagnoses:  Principal Problem:   Bleeding at insertion site Active Problems:   Diabetes mellitus due to underlying condition with diabetic nephropathy (HCC)   Chronic diastolic heart failure (HCC)   History of CVA in adulthood   Atrial fibrillation, permanent ([place])   HTN (hypertension)   ESRD on dialysis ([place])   Myasthenia gravis ([place])   Pressure injury of skin   Discharge Condition: stable  Diet recommendation: renal/DM  Filed Weights   06/12/17 1207 06/12/17 2034 06/13/17 1325  Weight: 73.8 kg (162 lb 11.2 oz) 73.5 kg (162 lb) 76 kg (167 lb 8.8 oz)    History of present illness:  James David is a 81 y.o. male with history of paroxysmal atrial fibrillation on Coumadin, ESRD on hemodialysis, mass and a gravis and stroke presented to the ER after patient had persistent bleeding from the AV fistula site after dialysis. Patient denies any trauma to the site.  Hospital Course:  Bleeding stopped with bandage. Fistulogram by vascular. Stent paced. Used fro HD. Pt cleared for d/c.  Pressure ulcers - Yes, I agree with the nursing documentation on Ulcers  Procedures:  Stent placed by vascular  Consultations:  Vascular, Nephro    Discharge Exam: Vitals:   06/13/17 1500 06/13/17 1530  BP: 112/60 135/70  Pulse: (!) 108 (!) 109  Resp:    Temp:    SpO2:      General: NAD, NCAT Cardiovascular: RRR, no MRG Respiratory: CTAB, nl WOB  Discharge Instructions    Discharge Medication List as of 06/13/2017  6:50 PM    START taking these medications   Details  !! lanthanum (FOSRENOL) 1000 MG chewable tablet Chew 1 tablet (1,000 mg total) by mouth 3 (three) times daily with meals., Starting Fri  06/13/2017, Until Thu 07/03/2017, Normal    !! lanthanum (FOSRENOL) 1000 MG chewable tablet Chew 1 tablet (1,000 mg total) by mouth with snacks., Starting Fri 06/13/2017, Until Thu 07/03/2017, Normal     !! - Potential duplicate medications found. Please discuss with provider.    CONTINUE these medications which have NOT CHANGED   Details  acetaminophen (TYLENOL) 500 MG tablet Take 500 mg by mouth every 8 (eight) hours as needed for mild pain. (pain/headaches), Historical Med    aspirin EC 81 MG tablet Take 81 mg by mouth at bedtime., Historical Med    carvedilol (COREG) 3.125 MG tablet Take 1 tablet (3.125 mg total) by mouth 2 (two) times daily with a meal., Starting Fri 02/14/2017, Print    ezetimibe (ZETIA) 10 MG tablet Take 1 tablet (10 mg total) by mouth daily., Starting Fri 02/14/2017, Print    fenofibrate 160 MG tablet Take 1 tablet (160 mg total) by mouth daily., Starting Fri 02/14/2017, Print    finasteride (PROSCAR) 5 MG tablet Take 1 tablet (5 mg total) by mouth daily., Starting Fri 02/14/2017, Print    gabapentin (NEURONTIN) 100 MG capsule Take 1 capsule (100 mg total) by mouth at bedtime., Starting Fri 02/14/2017, Print    glimepiride (AMARYL) 1 MG tablet Take 1 tablet (1 mg total) by mouth daily with breakfast., Starting Fri 02/14/2017, Print    Hypromellose (ARTIFICIAL TEARS OP) Place 1 drop into both eyes 3 (three) times daily as needed (for dry eyes). ,  Historical Med    ipratropium (ATROVENT) 0.03 % nasal spray Place 2 sprays into both nostrils daily. , Historical Med    multivitamin (RENA-VIT) TABS tablet Take 1 tablet by mouth at bedtime., Starting Fri 02/14/2017, Print    !! Nutritional Supplements (ENSURE PO) Take 237 mLs by mouth daily., Historical Med    warfarin (COUMADIN) 1 MG tablet 1 1/2 tab daily, Print    Amino Acids-Protein Hydrolys (FEEDING SUPPLEMENT, PRO-STAT SUGAR FREE 64,) LIQD Take 30 mLs by mouth 2 (two) times daily., Starting Tue 03/25/2017, Normal     famotidine (PEPCID) 40 MG/5ML suspension Take 2.5 mLs (20 mg total) by mouth at bedtime., Starting Wed 03/26/2017, Normal    ipratropium-albuterol (DUONEB) 0.5-2.5 (3) MG/3ML SOLN Take 3 mLs by nebulization every 6 (six) hours as needed., Starting Tue 03/25/2017, Normal    LORazepam (ATIVAN) 1 MG tablet Take 1 tablet (1 mg total) by mouth every 6 (six) hours as needed for anxiety., Starting Tue 03/25/2017, Print    !! Nutritional Supplements (FEEDING SUPPLEMENT, NEPRO CARB STEADY,) LIQD Take 237 mLs by mouth 2 (two) times daily between meals., Starting Tue 03/25/2017, No Print    predniSONE (DELTASONE) 20 MG tablet Take 1 tablet (20 mg total) by mouth daily with breakfast. Please note to continue taking 20 mg tablet for now  and you must be evaluated by your neurologist (Dr. [name]) to determine next Prednisone dose and frequency., Starting Wed 03/26/2017, No Print    traZODone (DESYREL) 50 MG tablet Take 1 tablet (50 mg total) by mouth at bedtime as needed for sleep., Starting Tue 03/25/2017, No Print     !! - Potential duplicate medications found. Please discuss with provider.    STOP taking these medications     calcium acetate (PHOSLO) 667 MG capsule        Allergies  Allergen Reactions  . Amlodipine Besylate Swelling    Angioedema   . Lisinopril Other (See Comments)    Renal insufficiency  . Statins Other (See Comments)    Muscle aches  . Ropinirole Other (See Comments)    Causes excessive grogginess the following morning after taking  . Tape Other (See Comments)    SKIN IS THIN AND TAPE WILL ACTUALLY TEAR IT; PLEASE USE COBAN WRAP INSTEAD!!      The results of significant diagnostics from this hospitalization (including imaging, microbiology, ancillary and laboratory) are listed below for reference.    Significant Diagnostic Studies: No results found.  Microbiology: Recent Results (from the past 240 hour(s))  MRSA PCR Screening     Status: None   Collection Time:  06/11/17  6:47 AM  Result Value Ref Range Status   MRSA by PCR NEGATIVE NEGATIVE Final    Comment:        The GeneXpert MRSA Assay (FDA approved for NASAL specimens only), is one component of a comprehensive MRSA colonization surveillance program. It is not intended to diagnose MRSA infection nor to guide or monitor treatment for MRSA infections.      Labs: Basic Metabolic Panel:  Recent Labs Lab 06/09/17 1730 06/09/17 1749 06/10/17 0604 06/11/17 0331 06/12/17 0252 06/13/17 0251  NA 128* 128* 133* 130* 128* 131*  K 5.2* 5.1 4.3 4.4 4.4 4.0  CL 91* 94* 97* 94* 94* 97*  CO2 20*  --  17* 17* 19* 21*  GLUCOSE 512* 533* 102* 138* 142* 125*  BUN 100* 100* 110* 134* 147* 68*  CREATININE 9.00* 9.10* 9.54* 11.00* 12.08* 8.08*  CALCIUM 9.7  --  9.3 9.0 9.1 8.8*  PHOS  --   --   --  8.9* 9.1* 6.5*   Liver Function Tests:  Recent Labs Lab 06/11/17 0331 06/12/17 0252 06/13/17 0251  ALBUMIN 2.9* 2.9* 3.0*   No results for input(s): LIPASE, AMYLASE in the last 168 hours. No results for input(s): AMMONIA in the last 168 hours. CBC:  Recent Labs Lab 06/09/17 1730 06/09/17 1749 06/10/17 0604 06/11/17 0630 06/12/17 0252 06/13/17 0251  WBC 12.1*  --  11.6* 11.3* 11.7* 9.9  NEUTROABS 10.6*  --   --   --   --  7.2  HGB 11.5* 12.9* 13.3 12.4* 11.2* 11.2*  HCT 36.6* 38.0* 38.5* 38.4* 33.9* 34.1*  MCV 99.2  --  91.2 92.5 91.6 92.7  PLT 385  --  220 221 222 210   Cardiac Enzymes: No results for input(s): CKTOTAL, CKMB, CKMBINDEX, TROPONINI in the last 168 hours. BNP: BNP (last 3 results)  Recent Labs  03/17/17 1304  BNP 479.7*    ProBNP (last 3 results) No results for input(s): PROBNP in the last 8760 hours.  CBG:  Recent Labs Lab 06/12/17 1322 06/12/17 1719 06/12/17 2032 06/13/17 0740 06/13/17 1157  GLUCAP 117* 392* 233* 150* 212*       Signed:  Elwin Mocha MD  FACP  [facility] 06/13/2017, 4:28 PM

## 2017-06-13 NOTE — Evaluation (Signed)
Physical Therapy Evaluation Patient Details Name: James David MRN: [id number] DOB: 1935/10/02 Today's Date: 06/13/2017   History of Present Illness  Pt is a 81 y/o male admitted secondary to bleeding from fistula graft site. Pt is s/p AV fistulogram on 9/26. PMH inlcues HTN, DM, COPD, CAD, CVA, a fib, ESRD on dialysis MWF, myasthenia gravis, s/p AV fistula placement, and s/p pacemaker insertion.   Clinical Impression  Pt admitted secondary to problem above with deficits below. PTA, pt required use of WC for primary mobility and required assist from wife to perform scoot transfers to [facility]. Upon eval, pt very weak, and required from mod to max A for basic sit<>stand transfers and for bed mobility. Did practice scooting up towards head of the bed and pt requiring min guard. Educated about need to use scoot transfers for all transfers to increase stability, until he is more independent with sit<>stand and stand pivot transfers. Pt reports he had a recent rehab stay and is wanting to d/c home from hospital with [facility] [place] services. Will need follow up services below and recommend drop arm 3 in 1 for ease of toilet transfers at home. Will continue to follow acutely to maximize functional mobility independence and safety.     Follow Up Recommendations Home health PT;Supervision/Assistance - 24 hour (HHOT)    Equipment Recommendations  3in1 (PT) (drop arm BSC)    Recommendations for Other Services OT consult     Precautions / Restrictions Precautions Precautions: Fall Restrictions Weight Bearing Restrictions: No      Mobility  Bed Mobility Overal bed mobility: Needs Assistance Bed Mobility: Supine to Sit;Sit to Supine     Supine to sit: Mod assist Sit to supine: Min guard   General bed mobility comments: Mod A for trunk elevation and scooting hips to EOB. Heavily reliant on use of bed rails. Educated to limit use of RUE to prevent bleeding.   Transfers Overall transfer level: Needs  assistance Equipment used: Rolling walker (2 wheeled) Transfers: Sit to/from Stand;Lateral/Scoot Transfers Sit to Stand: Max assist        Lateral/Scoot Transfers: Min guard General transfer comment: Practiced scooting at EOB to mimic scoot transfers at home. Pt requiring min guard for steadying assist of trunk during scooting. Reports he sometimes stands and pivots to toilet. Practiced sit<>stand and required max A. educated about equipment recommendations and use of scoot transfer technique for safety with mobility at home. Reports wife will be able to assist.   Ambulation/Gait             General Gait Details: Uses WC at baseline.   Stairs            Wheelchair Mobility    Modified Rankin (Stroke Patients Only)       Balance Overall balance assessment: Needs assistance Sitting-balance support: Bilateral upper extremity supported;Feet supported Sitting balance-Leahy Scale: Poor Sitting balance - Comments: Initially reliant on min to mod A to maintain balance, however, after sitting awhile able to progress to supervision.    Standing balance support: Bilateral upper extremity supported;During functional activity Standing balance-Leahy Scale: Poor Standing balance comment: Max A for standing balance.                              Pertinent Vitals/Pain Pain Assessment: No/denies pain    Home Living Family/patient expects to be discharged to:: Private residence Living Arrangements: Spouse/significant other Available Help at Discharge: Family;Available 24 hours/day Type of  Home: Other(Comment) (condo ) Home Access: Ramped entrance     Home Layout: One level Home Equipment: Grab bars - tub/shower;Grab bars - toilet;Wheelchair - manual;Tub bench;Walker - 2 wheels;Cane - single point      Prior Function Level of Independence: Needs assistance   Gait / Transfers Assistance Needed: Wife helps with scoot transfers, but sometimes able to help with  transfers to [facility]. Uses WC for primary mobility.   ADL's / Homemaking Assistance Needed: Requires assist from wife for transfers to toilet and to tub bench.         Hand Dominance   Dominant Hand: Right    Extremity/Trunk Assessment   Upper Extremity Assessment Upper Extremity Assessment: Generalized weakness    Lower Extremity Assessment Lower Extremity Assessment: RLE deficits/detail;LLE deficits/detail RLE Deficits / Details: Gross weakness. Grossly 3/5 throughout RLE Sensation: history of peripheral neuropathy LLE Deficits / Details: Gross weakness. Grossly 3/5 throughout LLE Sensation: history of peripheral neuropathy    Cervical / Trunk Assessment Cervical / Trunk Assessment: Kyphotic  Communication   Communication: No difficulties  Cognition Arousal/Alertness: Awake/alert Behavior During Therapy: WFL for tasks assessed/performed Overall Cognitive Status: Within Functional Limits for tasks assessed                                        General Comments General comments (skin integrity, edema, etc.): Educated about current deficits and questioned about SNF. Pt reporting he had a recent stay and is wanting to go home with [facility] services at d/c. Educated about equipment recommendations for home and safest techniques for transfers until he becomes more independent.     Exercises     Assessment/Plan    PT Assessment Patient needs continued PT services  PT Problem List Decreased strength;Decreased balance;Decreased mobility       PT Treatment Interventions DME instruction;Functional mobility training;Wheelchair mobility training;Neuromuscular re-education;Balance training;Therapeutic exercise;Therapeutic activities;Patient/family education    PT Goals (Current goals can be found in the Care Plan section)  Acute Rehab PT Goals Patient Stated Goal: to go home today  PT Goal Formulation: With patient Time For Goal Achievement: 06/27/17 Potential to  Achieve Goals: Fair    Frequency Min 3X/week   Barriers to discharge        Co-evaluation               AM-PAC PT "6 Clicks" Daily Activity  Outcome Measure Difficulty turning over in bed (including adjusting bedclothes, sheets and blankets)?: A Little Difficulty moving from lying on back to sitting on the side of the bed? : Unable Difficulty sitting down on and standing up from a chair with arms (e.g., wheelchair, bedside commode, etc,.)?: Unable Help needed moving to and from a bed to chair (including a wheelchair)?: A Lot Help needed walking in hospital room?: Total Help needed climbing 3-5 steps with a railing? : Total 6 Click Score: 9    End of Session Equipment Utilized During Treatment: Gait belt Activity Tolerance: Patient tolerated treatment well Patient left: in bed;with call bell/phone within reach;Other (comment) (transport staff in room ) Nurse Communication: Mobility status PT Visit Diagnosis: Muscle weakness (generalized) (M62.81);Unsteadiness on feet (R26.81)    Time: 1240-1303 PT Time Calculation (min) (ACUTE ONLY): 23 min   Charges:   PT Evaluation $PT Eval Moderate Complexity: 1 Mod PT Treatments $Therapeutic Activity: 8-22 mins   PT G Codes:        [place]  Hendricks Limes, PT, DPT  Acute Rehabilitation Services  Pager: [phone]   Rudean Hitt 06/13/2017, 1:34 PM

## 2017-06-13 NOTE — Progress Notes (Signed)
Patients and wife refused transport.  Patient discharged to home, IV removed, tele box returned, AVS reviewed with patient and family.

## 2017-06-13 NOTE — Discharge Instructions (Signed)
06/13/2017 HHRN, HHPT and [place] services to be resumed with [facility].

## 2017-06-13 NOTE — Progress Notes (Signed)
Patient ID: James David, male   DOB: 1936-05-07, 81 y.o.   MRN: [id number]  [facility] Progress Note    Subjective:   Seen in room, eating breakfast. Has no c/os today and ready to go home. Says no further bleeding episodes.   Objective:   BP (!) 126/97 (BP Location: Left Arm)   Pulse (!) 118   Temp 97.9 F (36.6 C) (Oral)   Resp 20   Ht 5\' 10"  (1.778 m)   Wt 73.5 kg (162 lb)   SpO2 99%   BMI 23.24 kg/m   Intake/Output: I/O last 3 completed shifts: In: 480 [P.O.:480] Out: 1500 [Other:1500]   Intake/Output this shift:  No intake/output data recorded. Weight change: -0.904 kg (-1 lb 15.9 oz)  Physical Exam: Gen:WN elderly man, frail and fatigued CVS:no rub Resp:cta PIR:JJOACZ Ext: RUE AVF +bruit, with skin tears from tape   Labs: BMET  Recent Labs Lab 06/09/17 1730 06/09/17 1749 06/10/17 0604 06/11/17 0331 06/12/17 0252 06/13/17 0251  NA 128* 128* 133* 130* 128* 131*  K 5.2* 5.1 4.3 4.4 4.4 4.0  CL 91* 94* 97* 94* 94* 97*  CO2 20*  --  17* 17* 19* 21*  GLUCOSE 512* 533* 102* 138* 142* 125*  BUN 100* 100* 110* 134* 147* 68*  CREATININE 9.00* 9.10* 9.54* 11.00* 12.08* 8.08*  ALBUMIN  --   --   --  2.9* 2.9* 3.0*  CALCIUM 9.7  --  9.3 9.0 9.1 8.8*  PHOS  --   --   --  8.9* 9.1* 6.5*   CBC  Recent Labs Lab 06/09/17 1730  06/10/17 0604 06/11/17 0630 06/12/17 0252 06/13/17 0251  WBC 12.1*  --  11.6* 11.3* 11.7* 9.9  NEUTROABS 10.6*  --   --   --   --  7.2  HGB 11.5*  < > 13.3 12.4* 11.2* 11.2*  HCT 36.6*  < > 38.5* 38.4* 33.9* 34.1*  MCV 99.2  --  91.2 92.5 91.6 92.7  PLT 385  --  220 221 222 210  < > = values in this interval not displayed.  @IMGRELPRIORS @ Medications:    . doxercalciferol  4 mcg Intravenous Q M,W,F-HD  . ezetimibe  10 mg Oral Q supper  . fenofibrate  160 mg Oral Daily  . gabapentin  100 mg Oral QHS  . glimepiride  1 mg Oral Q breakfast  . insulin aspart  0-9 Units Subcutaneous TID WC  . ipratropium  2  spray Each Nare Daily  . lanthanum  1,000 mg Oral TID WC  . lanthanum  1,000 mg Oral With snacks  . multivitamin  1 tablet Oral QHS   Dialysis Orders:  MWF at [facility] 4hr, BFR 400, DFR 800, EDW 75.5kg, 2K/2.25Ca bath, Profile 2/linear Na, AVF - No heparin (on warfarin) - Hectoral 51mcg IV q HD - Venofer 50mg  IV weekly - Mircera 19mcg IV q 2 weeks (last given 9/19)  Assessment/ Plan:   1. Prolonged AVF bleeding: James David [facility] Vascular surgery evaluated and felt likely due to elevated INR, however he bled with HD and then sent for fistulogram on which revealed a severe stenosis in the venous outflow. S/p angioplasty 9/26 with Dr. Donzetta Matters. Did better with HD yesterday 2. ESRD: MWF. HD yesterday d/t incomplete HD Wed. Plan HD today to get back on schedule.  3. Hypertension/volume: BP controlled, no edema. Now below EDW by weights here, Lower at discharge  4. Anemia: Hgb 13.3, above goal despite bleeding episode. Monitor. No  ESA needed for now. 5. Metabolic bone disease: Corr Ca 9.6 P 6.5< 9.1, Ca acetate binder changed to lanthanum for ^Ca  6. Myasthenia Gravis: With persistent weakness. Per primary. 7. A-fib: Warfarin on hold due to high INR on admit, per primary. 8. Type 2 DM: On glimiperide and insulin, per primary. 9. Disposition- possible discharge today after HD   Lynnda Child PA-C [employer] Pager [phone] 06/13/2017,9:50 AM  I have seen and examined this patient and agree with plan and assessment in the above note with renal recommendations/intervention highlighted.  Hopeful discharge to home after HD today. Broadus John A Marwah Disbro,MD 06/13/2017 11:22 AM

## 2017-06-13 NOTE — Care Management Note (Addendum)
Case Management Note  Patient Details  Name: James David MRN: [id number] Date of Birth: [birth year]  Subjective/Objective:     CM following for progression and d/c planning.               Action/Plan: 06/13/2017 Pt for d/c to home with HHRN, HHPT and HHOT per PT /OT recommendation, pt currently active for [facility] services with [facility]. Agency rep, [place] notified of pt needs and plan to d/c today. Orders to resume services entered.  Per PT pt would benefit from a drop arm bedside commode. Pt has all other DME  required for his current needs. [facility] contacted and will provide drop arm 3:1, to be delivered to room.   Expected Discharge Date:  06/13/2017              Expected Discharge Plan:  [place]  In-House Referral:     Discharge planning Services  CM Consult  Post Acute Care Choice:  Home Health Choice offered to:  Patient  DME Arranged:  Drop arm bedside commode DME Agency:  [facility]  HH Arranged:  RN, PT, OT HH Agency:  [facility]  Status of Service:  Completed, signed off  If discussed at [place] of Stay Meetings, dates discussed:    Additional Comments:  Adron Bene, RN 06/13/2017, 1:01 PM

## 2017-07-27 ENCOUNTER — Encounter (HOSPITAL_COMMUNITY): Payer: Self-pay

## 2017-07-27 ENCOUNTER — Other Ambulatory Visit: Payer: Self-pay

## 2017-07-27 ENCOUNTER — Inpatient Hospital Stay (HOSPITAL_COMMUNITY)
Admission: EM | Admit: 2017-07-27 | Discharge: 2017-07-31 | DRG: 056 | Disposition: A | Discharge status: Skilled Nursing Facility | Payer: Medicare Other | Attending: Internal Medicine | Admitting: Internal Medicine

## 2017-07-27 ENCOUNTER — Emergency Department (HOSPITAL_COMMUNITY): Payer: Medicare Other

## 2017-07-27 DIAGNOSIS — Z992 Dependence on renal dialysis: Secondary | ICD-10-CM

## 2017-07-27 DIAGNOSIS — R791 Abnormal coagulation profile: Secondary | ICD-10-CM | POA: Diagnosis present

## 2017-07-27 DIAGNOSIS — Z993 Dependence on wheelchair: Secondary | ICD-10-CM

## 2017-07-27 DIAGNOSIS — I10 Essential (primary) hypertension: Secondary | ICD-10-CM | POA: Diagnosis present

## 2017-07-27 DIAGNOSIS — I4821 Permanent atrial fibrillation: Secondary | ICD-10-CM | POA: Diagnosis present

## 2017-07-27 DIAGNOSIS — L8990 Pressure ulcer of unspecified site, unspecified stage: Secondary | ICD-10-CM | POA: Diagnosis present

## 2017-07-27 DIAGNOSIS — N186 End stage renal disease: Secondary | ICD-10-CM

## 2017-07-27 DIAGNOSIS — R531 Weakness: Secondary | ICD-10-CM

## 2017-07-27 DIAGNOSIS — Z833 Family history of diabetes mellitus: Secondary | ICD-10-CM

## 2017-07-27 DIAGNOSIS — Z87440 Personal history of urinary (tract) infections: Secondary | ICD-10-CM

## 2017-07-27 DIAGNOSIS — E1121 Type 2 diabetes mellitus with diabetic nephropathy: Secondary | ICD-10-CM | POA: Diagnosis present

## 2017-07-27 DIAGNOSIS — E1165 Type 2 diabetes mellitus with hyperglycemia: Secondary | ICD-10-CM | POA: Diagnosis present

## 2017-07-27 DIAGNOSIS — M71122 Other infective bursitis, left elbow: Secondary | ICD-10-CM | POA: Diagnosis present

## 2017-07-27 DIAGNOSIS — Z8673 Personal history of transient ischemic attack (TIA), and cerebral infarction without residual deficits: Secondary | ICD-10-CM

## 2017-07-27 DIAGNOSIS — Z9842 Cataract extraction status, left eye: Secondary | ICD-10-CM

## 2017-07-27 DIAGNOSIS — M62552 Muscle wasting and atrophy, not elsewhere classified, left thigh: Secondary | ICD-10-CM | POA: Diagnosis present

## 2017-07-27 DIAGNOSIS — M62551 Muscle wasting and atrophy, not elsewhere classified, right thigh: Secondary | ICD-10-CM | POA: Diagnosis present

## 2017-07-27 DIAGNOSIS — Z91048 Other nonmedicinal substance allergy status: Secondary | ICD-10-CM

## 2017-07-27 DIAGNOSIS — Z950 Presence of cardiac pacemaker: Secondary | ICD-10-CM

## 2017-07-27 DIAGNOSIS — N2581 Secondary hyperparathyroidism of renal origin: Secondary | ICD-10-CM | POA: Diagnosis present

## 2017-07-27 DIAGNOSIS — E0821 Diabetes mellitus due to underlying condition with diabetic nephropathy: Secondary | ICD-10-CM | POA: Diagnosis present

## 2017-07-27 DIAGNOSIS — G7000 Myasthenia gravis without (acute) exacerbation: Secondary | ICD-10-CM | POA: Diagnosis not present

## 2017-07-27 DIAGNOSIS — Z9841 Cataract extraction status, right eye: Secondary | ICD-10-CM

## 2017-07-27 DIAGNOSIS — Z85820 Personal history of malignant melanoma of skin: Secondary | ICD-10-CM

## 2017-07-27 DIAGNOSIS — Z8249 Family history of ischemic heart disease and other diseases of the circulatory system: Secondary | ICD-10-CM

## 2017-07-27 DIAGNOSIS — I482 Chronic atrial fibrillation: Secondary | ICD-10-CM | POA: Diagnosis present

## 2017-07-27 DIAGNOSIS — D649 Anemia, unspecified: Secondary | ICD-10-CM | POA: Diagnosis present

## 2017-07-27 DIAGNOSIS — Z7982 Long term (current) use of aspirin: Secondary | ICD-10-CM

## 2017-07-27 DIAGNOSIS — M25522 Pain in left elbow: Secondary | ICD-10-CM | POA: Diagnosis present

## 2017-07-27 DIAGNOSIS — Z888 Allergy status to other drugs, medicaments and biological substances status: Secondary | ICD-10-CM

## 2017-07-27 DIAGNOSIS — Z7984 Long term (current) use of oral hypoglycemic drugs: Secondary | ICD-10-CM

## 2017-07-27 DIAGNOSIS — L89622 Pressure ulcer of left heel, stage 2: Secondary | ICD-10-CM | POA: Diagnosis present

## 2017-07-27 DIAGNOSIS — M62561 Muscle wasting and atrophy, not elsewhere classified, right lower leg: Secondary | ICD-10-CM | POA: Diagnosis present

## 2017-07-27 DIAGNOSIS — L89510 Pressure ulcer of right ankle, unstageable: Secondary | ICD-10-CM | POA: Diagnosis present

## 2017-07-27 DIAGNOSIS — B965 Pseudomonas (aeruginosa) (mallei) (pseudomallei) as the cause of diseases classified elsewhere: Secondary | ICD-10-CM | POA: Diagnosis present

## 2017-07-27 DIAGNOSIS — Z7901 Long term (current) use of anticoagulants: Secondary | ICD-10-CM

## 2017-07-27 DIAGNOSIS — N390 Urinary tract infection, site not specified: Secondary | ICD-10-CM | POA: Diagnosis present

## 2017-07-27 DIAGNOSIS — E1122 Type 2 diabetes mellitus with diabetic chronic kidney disease: Secondary | ICD-10-CM | POA: Diagnosis present

## 2017-07-27 DIAGNOSIS — D631 Anemia in chronic kidney disease: Secondary | ICD-10-CM | POA: Diagnosis present

## 2017-07-27 DIAGNOSIS — Z961 Presence of intraocular lens: Secondary | ICD-10-CM | POA: Diagnosis present

## 2017-07-27 DIAGNOSIS — M62562 Muscle wasting and atrophy, not elsewhere classified, left lower leg: Secondary | ICD-10-CM | POA: Diagnosis present

## 2017-07-27 DIAGNOSIS — I120 Hypertensive chronic kidney disease with stage 5 chronic kidney disease or end stage renal disease: Secondary | ICD-10-CM | POA: Diagnosis present

## 2017-07-27 DIAGNOSIS — N3000 Acute cystitis without hematuria: Secondary | ICD-10-CM

## 2017-07-27 HISTORY — DX: Anemia, unspecified: D64.9

## 2017-07-27 LAB — URINALYSIS, ROUTINE W REFLEX MICROSCOPIC
Bacteria, UA: NONE SEEN
Bilirubin Urine: NEGATIVE
KETONES UR: NEGATIVE mg/dL
Nitrite: NEGATIVE
PROTEIN: 100 mg/dL — AB
RBC / HPF: NONE SEEN RBC/hpf (ref 0–5)
Specific Gravity, Urine: 1.01 (ref 1.005–1.030)
Squamous Epithelial / LPF: NONE SEEN
pH: 8 (ref 5.0–8.0)

## 2017-07-27 LAB — URINALYSIS, DIPSTICK ONLY
Bilirubin Urine: NEGATIVE
Ketones, ur: NEGATIVE mg/dL
Nitrite: NEGATIVE
PH: 8 (ref 5.0–8.0)
Protein, ur: 100 mg/dL — AB
Specific Gravity, Urine: 1.01 (ref 1.005–1.030)

## 2017-07-27 LAB — CBC WITH DIFFERENTIAL/PLATELET
BASOS ABS: 0 10*3/uL (ref 0.0–0.1)
Basophils Relative: 0 %
Eosinophils Absolute: 0 10*3/uL (ref 0.0–0.7)
Eosinophils Relative: 0 %
HEMATOCRIT: 33 % — AB (ref 39.0–52.0)
Hemoglobin: 10.3 g/dL — ABNORMAL LOW (ref 13.0–17.0)
LYMPHS ABS: 0.7 10*3/uL (ref 0.7–4.0)
LYMPHS PCT: 5 %
MCH: 32.8 pg (ref 26.0–34.0)
MCHC: 31.2 g/dL (ref 30.0–36.0)
MCV: 105.1 fL — AB (ref 78.0–100.0)
MONO ABS: 0.5 10*3/uL (ref 0.1–1.0)
Monocytes Relative: 4 %
NEUTROS ABS: 12.2 10*3/uL — AB (ref 1.7–7.7)
Neutrophils Relative %: 91 %
Platelets: 214 10*3/uL (ref 150–400)
RBC: 3.14 MIL/uL — AB (ref 4.22–5.81)
RDW: 20.5 % — AB (ref 11.5–15.5)
WBC: 13.4 10*3/uL — ABNORMAL HIGH (ref 4.0–10.5)

## 2017-07-27 LAB — COMPREHENSIVE METABOLIC PANEL
ALT: 22 U/L (ref 17–63)
ANION GAP: 13 (ref 5–15)
AST: 20 U/L (ref 15–41)
Albumin: 2.9 g/dL — ABNORMAL LOW (ref 3.5–5.0)
Alkaline Phosphatase: 45 U/L (ref 38–126)
BILIRUBIN TOTAL: 0.6 mg/dL (ref 0.3–1.2)
BUN: 69 mg/dL — AB (ref 6–20)
CO2: 25 mmol/L (ref 22–32)
Calcium: 9.8 mg/dL (ref 8.9–10.3)
Chloride: 92 mmol/L — ABNORMAL LOW (ref 101–111)
Creatinine, Ser: 8.57 mg/dL — ABNORMAL HIGH (ref 0.61–1.24)
GFR calc Af Amer: 6 mL/min — ABNORMAL LOW (ref >60)
GFR calc non Af Amer: 5 mL/min — ABNORMAL LOW (ref >60)
GLUCOSE: 406 mg/dL — AB (ref 65–99)
Potassium: 5.5 mmol/L — ABNORMAL HIGH (ref 3.5–5.1)
Sodium: 130 mmol/L — ABNORMAL LOW (ref 135–145)
TOTAL PROTEIN: 5.8 g/dL — AB (ref 6.5–8.1)

## 2017-07-27 LAB — URIC ACID: Uric Acid, Serum: 7.1 mg/dL (ref 4.4–7.6)

## 2017-07-27 LAB — GLUCOSE, CAPILLARY: GLUCOSE-CAPILLARY: 206 mg/dL — AB (ref 65–99)

## 2017-07-27 LAB — TROPONIN I

## 2017-07-27 LAB — PROTIME-INR
INR: 1.87
Prothrombin Time: 21.3 seconds — ABNORMAL HIGH (ref 11.4–15.2)

## 2017-07-27 LAB — C-REACTIVE PROTEIN: CRP: 6.9 mg/dL — ABNORMAL HIGH (ref <1.0)

## 2017-07-27 MED ORDER — ASPIRIN EC 81 MG PO TBEC
81.0000 mg | DELAYED_RELEASE_TABLET | Freq: Every day | ORAL | Status: DC
  Administered 2017-07-27 – 2017-07-30 (×4): 81 mg via ORAL
  Filled 2017-07-27 (×4): qty 1

## 2017-07-27 MED ORDER — ACETAMINOPHEN 650 MG RE SUPP: 650.0000 mg | Freq: Four times a day (QID) | RECTAL | Status: DC | PRN

## 2017-07-27 MED ORDER — CARVEDILOL 3.125 MG PO TABS: 3.1250 mg | ORAL_TABLET | Freq: Two times a day (BID) | ORAL | Status: DC

## 2017-07-27 MED ORDER — CAMPHOR-MENTHOL 0.5-0.5 % EX LOTN: 1.0000 "application " | TOPICAL_LOTION | Freq: Three times a day (TID) | CUTANEOUS | Status: DC | PRN

## 2017-07-27 MED ORDER — INSULIN ASPART 100 UNIT/ML ~~LOC~~ SOLN
0.0000 [IU] | Freq: Every day | SUBCUTANEOUS | Status: DC
  Administered 2017-07-27 – 2017-07-29 (×3): 2 [IU] via SUBCUTANEOUS
  Administered 2017-07-30: 3 [IU] via SUBCUTANEOUS

## 2017-07-27 MED ORDER — DOCUSATE SODIUM 283 MG RE ENEM
1.0000 | ENEMA | RECTAL | Status: DC | PRN
Start: 2017-07-27 — End: 2017-07-31
  Filled 2017-07-27: qty 1

## 2017-07-27 MED ORDER — FAMOTIDINE 20 MG PO TABS
20.0000 mg | ORAL_TABLET | Freq: Every day | ORAL | Status: DC
  Administered 2017-07-27 – 2017-07-30 (×4): 20 mg via ORAL
  Filled 2017-07-27 (×4): qty 1

## 2017-07-27 MED ORDER — DEXTROSE 5 % IV SOLN
1.0000 g | INTRAVENOUS | Status: DC
  Administered 2017-07-28: 1 g via INTRAVENOUS
  Filled 2017-07-27: qty 10

## 2017-07-27 MED ORDER — DOCUSATE SODIUM 100 MG PO CAPS
100.0000 mg | ORAL_CAPSULE | Freq: Two times a day (BID) | ORAL | Status: DC
  Administered 2017-07-28 – 2017-07-31 (×5): 100 mg via ORAL
  Filled 2017-07-27 (×8): qty 1

## 2017-07-27 MED ORDER — GABAPENTIN 100 MG PO CAPS
100.0000 mg | ORAL_CAPSULE | Freq: Every day | ORAL | Status: DC
  Administered 2017-07-27 – 2017-07-30 (×4): 100 mg via ORAL
  Filled 2017-07-27 (×4): qty 1

## 2017-07-27 MED ORDER — INSULIN ASPART 100 UNIT/ML ~~LOC~~ SOLN
0.0000 [IU] | Freq: Three times a day (TID) | SUBCUTANEOUS | Status: DC
  Administered 2017-07-28: 3 [IU] via SUBCUTANEOUS
  Administered 2017-07-29: 5 [IU] via SUBCUTANEOUS
  Administered 2017-07-29: 8 [IU] via SUBCUTANEOUS
  Administered 2017-07-30: 5 [IU] via SUBCUTANEOUS
  Administered 2017-07-31: 8 [IU] via SUBCUTANEOUS

## 2017-07-27 MED ORDER — SODIUM CHLORIDE 0.9 % IV SOLN
INTRAVENOUS | Status: DC
  Administered 2017-07-27: 16:00:00 via INTRAVENOUS

## 2017-07-27 MED ORDER — PIPERACILLIN-TAZOBACTAM 3.375 G IVPB
3.3750 g | Freq: Two times a day (BID) | INTRAVENOUS | Status: DC
  Filled 2017-07-27: qty 50

## 2017-07-27 MED ORDER — CARVEDILOL 25 MG PO TABS
25.0000 mg | ORAL_TABLET | Freq: Two times a day (BID) | ORAL | Status: DC
  Administered 2017-07-28 – 2017-07-31 (×7): 25 mg via ORAL
  Filled 2017-07-27 (×7): qty 1

## 2017-07-27 MED ORDER — IPRATROPIUM-ALBUTEROL 0.5-2.5 (3) MG/3ML IN SOLN: 3.0000 mL | Freq: Four times a day (QID) | RESPIRATORY_TRACT | Status: DC | PRN

## 2017-07-27 MED ORDER — TRAZODONE HCL 50 MG PO TABS
50.0000 mg | ORAL_TABLET | Freq: Every evening | ORAL | Status: DC | PRN
  Administered 2017-07-28 – 2017-07-30 (×2): 50 mg via ORAL
  Filled 2017-07-27 (×2): qty 1

## 2017-07-27 MED ORDER — HYDROXYZINE HCL 25 MG PO TABS: 25.0000 mg | ORAL_TABLET | Freq: Three times a day (TID) | ORAL | Status: DC | PRN

## 2017-07-27 MED ORDER — SODIUM CHLORIDE 0.9 % IV SOLN
INTRAVENOUS | Status: DC
  Administered 2017-07-27 – 2017-07-28 (×2): via INTRAVENOUS

## 2017-07-27 MED ORDER — CEFTRIAXONE SODIUM 1 G IJ SOLR
1.0000 g | Freq: Once | INTRAMUSCULAR | Status: AC
  Administered 2017-07-27: 1 g via INTRAVENOUS
  Filled 2017-07-27: qty 10

## 2017-07-27 MED ORDER — NEPRO/CARBSTEADY PO LIQD
237.0000 mL | Freq: Two times a day (BID) | ORAL | Status: DC
  Administered 2017-07-28 – 2017-07-31 (×6): 237 mL via ORAL

## 2017-07-27 MED ORDER — LORAZEPAM 1 MG PO TABS: 1.0000 mg | ORAL_TABLET | Freq: Four times a day (QID) | ORAL | Status: DC | PRN

## 2017-07-27 MED ORDER — CALCIUM CARBONATE ANTACID 1250 MG/5ML PO SUSP
500.0000 mg | Freq: Four times a day (QID) | ORAL | Status: DC | PRN
  Filled 2017-07-27: qty 5

## 2017-07-27 MED ORDER — ONDANSETRON HCL 4 MG PO TABS: 4.0000 mg | ORAL_TABLET | Freq: Four times a day (QID) | ORAL | Status: DC | PRN

## 2017-07-27 MED ORDER — VANCOMYCIN HCL 500 MG IV SOLR
500.0000 mg | Freq: Once | INTRAVENOUS | Status: AC
  Administered 2017-07-28: 500 mg via INTRAVENOUS
  Filled 2017-07-27: qty 500

## 2017-07-27 MED ORDER — FINASTERIDE 5 MG PO TABS
5.0000 mg | ORAL_TABLET | Freq: Every day | ORAL | Status: DC
  Administered 2017-07-28 – 2017-07-31 (×4): 5 mg via ORAL
  Filled 2017-07-27 (×4): qty 1

## 2017-07-27 MED ORDER — ZOLPIDEM TARTRATE 5 MG PO TABS: 5.0000 mg | ORAL_TABLET | Freq: Every evening | ORAL | Status: DC | PRN

## 2017-07-27 MED ORDER — ONDANSETRON HCL 4 MG/2ML IJ SOLN: 4.0000 mg | Freq: Four times a day (QID) | INTRAMUSCULAR | Status: DC | PRN

## 2017-07-27 MED ORDER — SORBITOL 70 % SOLN
30.0000 mL | Status: DC | PRN
  Filled 2017-07-27: qty 30

## 2017-07-27 MED ORDER — NEPRO/CARBSTEADY PO LIQD: 237.0000 mL | Freq: Three times a day (TID) | ORAL | Status: DC | PRN

## 2017-07-27 MED ORDER — RENA-VITE PO TABS
1.0000 | ORAL_TABLET | Freq: Every day | ORAL | Status: DC
  Administered 2017-07-27 – 2017-07-30 (×4): 1 via ORAL
  Filled 2017-07-27 (×4): qty 1

## 2017-07-27 MED ORDER — WARFARIN - PHYSICIAN DOSING INPATIENT: Freq: Every day | Status: DC

## 2017-07-27 MED ORDER — INSULIN ASPART 100 UNIT/ML ~~LOC~~ SOLN
8.0000 [IU] | Freq: Once | SUBCUTANEOUS | Status: AC
  Administered 2017-07-27: 8 [IU] via SUBCUTANEOUS
  Filled 2017-07-27: qty 1

## 2017-07-27 MED ORDER — IPRATROPIUM BROMIDE 0.06 % NA SOLN
2.0000 | Freq: Every day | NASAL | Status: DC
  Administered 2017-07-28 – 2017-07-31 (×4): 2 via NASAL
  Filled 2017-07-27: qty 15

## 2017-07-27 MED ORDER — FENOFIBRATE 160 MG PO TABS
160.0000 mg | ORAL_TABLET | Freq: Every day | ORAL | Status: DC
  Administered 2017-07-28 – 2017-07-31 (×4): 160 mg via ORAL
  Filled 2017-07-27 (×4): qty 1

## 2017-07-27 MED ORDER — EZETIMIBE 10 MG PO TABS
10.0000 mg | ORAL_TABLET | Freq: Every day | ORAL | Status: DC
  Administered 2017-07-28 – 2017-07-30 (×3): 10 mg via ORAL
  Filled 2017-07-27 (×3): qty 1

## 2017-07-27 MED ORDER — ACETAMINOPHEN 325 MG PO TABS
650.0000 mg | ORAL_TABLET | Freq: Four times a day (QID) | ORAL | Status: DC | PRN
  Administered 2017-07-28 – 2017-07-31 (×5): 650 mg via ORAL
  Filled 2017-07-27 (×4): qty 2

## 2017-07-27 MED ORDER — WARFARIN SODIUM 1 MG PO TABS
1.5000 mg | ORAL_TABLET | Freq: Every day | ORAL | Status: DC
  Administered 2017-07-27: 1.5 mg via ORAL
  Filled 2017-07-27: qty 1

## 2017-07-27 MED ORDER — VANCOMYCIN HCL IN DEXTROSE 750-5 MG/150ML-% IV SOLN
750.0000 mg | INTRAVENOUS | Status: DC
  Administered 2017-07-28 – 2017-07-30 (×2): 750 mg via INTRAVENOUS
  Filled 2017-07-27 (×3): qty 150

## 2017-07-27 MED ORDER — VANCOMYCIN HCL IN DEXTROSE 1-5 GM/200ML-% IV SOLN
1000.0000 mg | Freq: Once | INTRAVENOUS | Status: AC
  Administered 2017-07-27: 1000 mg via INTRAVENOUS
  Filled 2017-07-27: qty 200

## 2017-07-27 MED ORDER — PREDNISONE 20 MG PO TABS
20.0000 mg | ORAL_TABLET | Freq: Every day | ORAL | Status: DC
  Administered 2017-07-28 – 2017-07-31 (×4): 20 mg via ORAL
  Filled 2017-07-27 (×4): qty 1

## 2017-07-27 NOTE — ED Notes (Signed)
Attempted report x1. RN unavailable @ this time. Call back # left, awaiting call back.

## 2017-07-27 NOTE — ED Triage Notes (Signed)
Pt from home with weakness and left elbow pain

## 2017-07-27 NOTE — H&P (Signed)
History and Physical    MONTRELL CESSNA RCV:[id number] DOB: 06/16/1936 DOA: 07/27/2017  PCP: [name], MD Consultants:  Tamala Julian - cardiology; Sater - neurology; Justin Mend - nephrology Patient coming from:  Home - lives with wife; NOK: wife, [phone]  Chief Complaint: weakness  HPI: James David is a 81 y.o. male with medical history significant of CVA in 5/18; pacemaker placement; HTN; HLD; ESRD on MWF HD; DM; afib on Coumadin; and myasthenia gravis presenting with weakness.  He reports that the myasthenia gravis has "really put the locks on me."  He has been unable to walk since shortly after his CVA in 5/18 (CVA 5/16, hospitalized, seemed to be getting better and was sent home but the next day he started falling and was unable to walk and hasn't been able to since). His wife called the [facility] today because he has been so weak the last few days - she had to call EMS to get him out of the floor.  The nurse came to see him and thought he needed to be seen and so she callled 911 because he is so weak.  For the last few days to week, it is hard to get him up into the wheelchair or to get him back into the bed.  Wednesday night was the first time his wife had to call EMS due to a fall in several weeks.  Since Friday, it has gotten even worse.    Hospitalizations/ER visits in the last 6 months: 5/16-6/2 - right subcortical infarction requiring rehab 6/3 - orthostatic syncope 6/6 - weakness 7/2-11 - acute respiratory failure due to MG crisis/E coli urosepsis 9/24-28 - bleeding from AV fistula with early shock   ED Course: Abnormal UA, hyperglycemia.  Elbow films with ?radial head fracture - doubtful.  Suggest outpatient orthopedics consult.  Possible mild bursitis on exam.  Review of Systems: As per HPI; otherwise review of systems reviewed and negative.   Ambulatory Status:  Non-ambulatory  Past Medical History:  Diagnosis Date  . Atrial fibrillation ([place])    on Coumadin  . DDD  (degenerative disc disease)   . Diabetes mellitus without complication ([place])   . ESRD (end stage renal disease) on dialysis ([place])   . Hyperlipidemia   . Hypertension   . Melanoma ([place]) 2010   removed at [facility]  . Second degree heart block    a. s/p STJ dual chamber PPM - Dr [name]  . Stroke [facility]) 01/2017    Past Surgical History:  Procedure Laterality Date  . CATARACT EXTRACTION W/ INTRAOCULAR LENS  IMPLANT, BILATERAL    . COLONOSCOPY    . LAMINECTOMY      Social History   Socioeconomic History  . Marital status: Married    Spouse name: [name]  . Number of children: Not on file  . Years of education: Not on file  . Highest education level: Not on file  Social Needs  . Financial resource strain: Not on file  . Food insecurity - worry: Not on file  . Food insecurity - inability: Not on file  . Transportation needs - medical: Not on file  . Transportation needs - non-medical: Not on file  Occupational History    Comment: retired  Tobacco Use  . Smoking status: Never Smoker  . Smokeless tobacco: Never Used  Substance and Sexual Activity  . Alcohol use: No  . Drug use: No  . Sexual activity: No  Other Topics Concern  . Not on file  Social History Narrative   Lives in [place] with spouse.  Retired Theatre manager for [employer].    Allergies  Allergen Reactions  . Amlodipine Besylate Swelling    Angioedema   . Lisinopril Other (See Comments)    Renal insufficiency  . Statins Other (See Comments)    Muscle aches  . Ropinirole Other (See Comments)    Causes excessive grogginess the following morning after taking  . Tape Other (See Comments)    SKIN IS THIN AND TAPE WILL ACTUALLY TEAR IT; PLEASE USE COBAN WRAP INSTEAD!!    Family History  Problem Relation Age of Onset  . Pulmonary embolism Mother   . Diabetes Mother   . Heart attack Father   . Diabetes Brother     Prior to Admission medications   Medication Sig Start Date End Date Taking? Authorizing  Provider  acetaminophen (TYLENOL) 500 MG tablet Take 500 mg by mouth every 8 (eight) hours as needed for mild pain. (pain/headaches)    [provider]  Amino Acids-Protein Hydrolys (FEEDING SUPPLEMENT, PRO-STAT SUGAR FREE 64,) LIQD Take 30 mLs by mouth 2 (two) times daily. Patient not taking: Reported on 06/09/2017 03/25/17   Theodis Blaze, MD  aspirin EC 81 MG tablet Take 81 mg by mouth at bedtime.    [provider]  carvedilol (COREG) 3.125 MG tablet Take 1 tablet (3.125 mg total) by mouth 2 (two) times daily with a meal. 02/14/17   Angiulli, Lavon Paganini, PA-C  ezetimibe (ZETIA) 10 MG tablet Take 1 tablet (10 mg total) by mouth daily. Patient taking differently: Take 10 mg by mouth daily with supper.  02/14/17   Angiulli, Lavon Paganini, PA-C  famotidine (PEPCID) 40 MG/5ML suspension Take 2.5 mLs (20 mg total) by mouth at bedtime. Patient not taking: Reported on 06/09/2017 03/26/17   Theodis Blaze, MD  fenofibrate 160 MG tablet Take 1 tablet (160 mg total) by mouth daily. 02/14/17   Angiulli, Lavon Paganini, PA-C  finasteride (PROSCAR) 5 MG tablet Take 1 tablet (5 mg total) by mouth daily. 02/14/17   Angiulli, Lavon Paganini, PA-C  gabapentin (NEURONTIN) 100 MG capsule Take 1 capsule (100 mg total) by mouth at bedtime. 02/14/17   Angiulli, Lavon Paganini, PA-C  glimepiride (AMARYL) 1 MG tablet Take 1 tablet (1 mg total) by mouth daily with breakfast. 02/14/17   Angiulli, Lavon Paganini, PA-C  Hypromellose (ARTIFICIAL TEARS OP) Place 1 drop into both eyes 3 (three) times daily as needed (for dry eyes).     [provider]  ipratropium (ATROVENT) 0.03 % nasal spray Place 2 sprays into both nostrils daily.     [provider]  ipratropium-albuterol (DUONEB) 0.5-2.5 (3) MG/3ML SOLN Take 3 mLs by nebulization every 6 (six) hours as needed. Patient not taking: Reported on 06/09/2017 03/25/17   Theodis Blaze, MD  LORazepam (ATIVAN) 1 MG tablet Take 1 tablet (1 mg total) by mouth every 6 (six) hours as needed for  anxiety. Patient not taking: Reported on 06/09/2017 03/25/17   Theodis Blaze, MD  multivitamin (RENA-VIT) TABS tablet Take 1 tablet by mouth at bedtime. 02/14/17   Angiulli, Lavon Paganini, PA-C  Nutritional Supplements (ENSURE PO) Take 237 mLs by mouth daily.    [provider]  Nutritional Supplements (FEEDING SUPPLEMENT, NEPRO CARB STEADY,) LIQD Take 237 mLs by mouth 2 (two) times daily between meals. Patient not taking: Reported on 06/09/2017 03/25/17   Theodis Blaze, MD  predniSONE (DELTASONE) 20 MG tablet Take 1 tablet (  20 mg total) by mouth daily with breakfast. Please note to continue taking 20 mg tablet for now  and you must be evaluated by your neurologist (Dr. [name]) to determine next Prednisone dose and frequency. Patient not taking: Reported on 06/09/2017 03/26/17   Bonnielee Haff, MD  traZODone (DESYREL) 50 MG tablet Take 1 tablet (50 mg total) by mouth at bedtime as needed for sleep. Patient not taking: Reported on 06/09/2017 03/25/17   Theodis Blaze, MD  warfarin (COUMADIN) 1 MG tablet 1 1/2 tab daily Patient taking differently: Take 1.5 mg by mouth daily with supper.  02/14/17   Cathlyn Parsons, PA-C    Physical Exam: Vitals:   07/27/17 1900 07/27/17 1930 07/27/17 2000 07/27/17 2111  BP:   (!) 156/66 (!) 141/52  Pulse: 85 86 85 80  Resp: 17 20 (!) 22 18  Temp:    98.1 F (36.7 C)  TempSrc:    Oral  SpO2: 100% 99% 97% 100%     General:  Appears calm and comfortable and is NAD Eyes:  PERRL, EOMI, normal lids, iris ENT:  grossly normal hearing, lips & tongue, mmm Neck:  no LAD, masses or thyromegaly Cardiovascular:  Irregularly irregular but rate controlled, no m/r/g. No LE edema.  Respiratory:   CTA bilaterally with no wheezes/rales/rhonchi.  Normal respiratory effort. Abdomen:  soft, NT, ND, NABS Skin:  3 cm eschar on right lateral ankle with mild surrounding erythema; stage 2-3 pressure ulcer on left heel, no surrounding erythema Musculoskeletal:  There is an  effusion on the left elbow that is quite TTP; there is mild erythema overlying the effusion. Psychiatric:  grossly normal mood and affect, speech fluent and appropriate, AOx3 Neurologic:  CN 2-12 grossly intact   Radiological Exams on Admission: Dg Chest 2 View  Result Date: 07/27/2017 CLINICAL DATA:  81 y/o  M; weakness. EXAM: CHEST  2 VIEW COMPARISON:  03/21/2017 CT head FINDINGS: Stable cardiomegaly. Aortic atherosclerosis with calcification. Clear lungs. No pleural effusion or pneumothorax. No acute osseous abnormality is evident. Two lead pacemaker noted. Surgical clips project over left axilla. Right axillary vascular stent. IMPRESSION: Stable cardiomegaly.  No acute pulmonary process identified. Electronically Signed   By: Kristine Garbe M.D.   On: 07/27/2017 17:37   Dg Elbow Complete Left  Result Date: 07/27/2017 CLINICAL DATA:  81 y/o  M; elbow pain. EXAM: LEFT ELBOW - COMPLETE 3+ VIEW COMPARISON:  None. FINDINGS: Lucency in radial head on a single oblique view and elevated anterior fat pad, question radial head fracture. Calcification adjacent to the lateral epicondyles compatible with lateral epicondylitis. Vascular calcifications. No joint dislocation. IMPRESSION: Lucency in radial head on single view and elevated anterior fat pad, question radial head fracture. Electronically Signed   By: Kristine Garbe M.D.   On: 07/27/2017 17:26    EKG: Independently reviewed.  NSR with rate 89; IVCD, LVH; nonspecific ST changes with no evidence of acute ischemia  Labs on Admission: I have personally reviewed the available labs and imaging studies at the time of the admission.  Pertinent labs:   Troponin <0.03 UA: >500 glucose; small Hgb; large LE; negative nitrite; 100 protein; TNTC WBC; no bacteria Urine culture pending Na++ 130, prior 131 in 9/18 K+ 5.5 Glucose 406 BUN 69/Creatinine 8.57/GFR 5 Albumin 2.9, prior 3.0 WBC 13.4 Hgb 10.3, prior 11.2 INR  1.87  Assessment/Plan Principal Problem:   Weakness Active Problems:   Diabetes mellitus due to underlying condition with diabetic nephropathy (HCC)   CKD (chronic  kidney disease) stage V requiring chronic dialysis (HCC)   Atrial fibrillation, permanent (HCC)   Supratherapeutic INR   Anemia   Myasthenia gravis (HCC)   Benign essential HTN   Pressure injury of skin   UTI (urinary tract infection)   Left elbow pain   Weakness -Patient with h/o myasthenia gravis fairly recently diagnosed but with several recent exacerbations and rapid progression to non-ambulatory status  -Presented today with acute on chronic weakness -Thought to have UTI as cause by ER physician (see below) -While UTI is a possible cause, his left elbow issue could be a factor but his MG is an even greater concern (see below) -Will observe for now  -Request PT consult -While possibly not all that helpful, will order inflammatory markers including ESR and CRP to see if this helps to determine the etiology for the patient's weakness  UTI -Despite ESRD, patient does make urine -His UA could be consistent with UTI but is not diagnostic for this -He had prior culture-proven UTIs in 7/18 (pansensitive E coli) and 6/18 (pan-sensitive Staph epidermidis) -Will continue treatment with Rocephin for now -If this is the cause of the patient's weakness, he may be ready for discharge in the next 1-2 days  Left elbow pain -He has an effusion in the bursa and this may be a simple bursitis -Given concern for infection, will also treat for septic arthritis empirically for now (lower suspicion) with addition of Vanc to previously-added Rocephin -Gout is another consideration; will check uric acid level -X-ray also revealed a possible fracture -If the elbow is still bothersome to the patient tomorrow, would suggest consulting orthopedics as an inpatient -If improving, would instead continue with the plan for outpatient  f/u  Myasthenia gravis -This is the greatest concern for the patient -He was unable to tolerate Mestinon due to diarrhea -He was previously treated with IVIg x 2 (?) -He has been maintained on 20 mg prednisone daily -His wife reports that he is due next week for another infusion - presumably of IVIg -If current symptoms do not improve with treatment of UTI, would suggest consideration of neurology consult  ESRD on MWF HD -Patient on chronic MWF HD -Nephrology prn order set utilized -He does not appear to be volume overloaded or otherwise in need of acute HD -Maintenance HD telephone line called and left message that patient will need HD  DM -Extremely poor control today, which the patient says is quite unusual for him -A1c was 5.7 in 7/18 -Will hold Amaryl -Cover with moderate-scale SSI  Afib on anticoagulation -Rate controlled -Subtherapeutic on Coumadin - will ask pharmacy to dose  Anemia from CKD -Slightly lower than prior -Will trend  Pressure ulcers -These do not appear to be the source of his current illness but also are in need of assistance in managing them -Wound care consult requested  HTN -Coreg dose was changed from 3.125 mg BID to 25 mg BID over a month ago; will continue increased dose (confirmed by pharmacy)   DVT prophylaxis: Coumadin Code Status:  Full - confirmed with patient/family Family Communication: Wife present throughout evaluation  Disposition Plan:  Home once clinically improved Consults called: Nephrology; Wound care  Admission status: It is my clinical opinion that referral for OBSERVATION is reasonable and necessary in this patient based on the above information provided. The aforementioned taken together are felt to place the patient at high risk for further clinical deterioration. However it is anticipated that the patient may be medically stable for  discharge from the hospital within 24 to 48 hours.    Karmen Bongo MD [facility]  If note is complete, please contact covering daytime or nighttime physician. [email or web address] Password [email or web address]  07/27/2017, 10:30 PM

## 2017-07-27 NOTE — Progress Notes (Addendum)
ANTICOAGULATION CONSULT NOTE - Initial Consult  Pharmacy Consult for Coumadin Indication: atrial fibrillation  Allergies  Allergen Reactions  . Amlodipine Besylate Swelling    Angioedema   . Lisinopril Other (See Comments)    Renal insufficiency  . Statins Other (See Comments)    Muscle aches  . Ropinirole Other (See Comments)    Causes excessive grogginess the following morning after taking  . Tape Other (See Comments)    SKIN IS THIN AND TAPE WILL ACTUALLY TEAR IT; PLEASE USE COBAN WRAP INSTEAD!!    Patient Measurements: Weight: 177 lb (80.3 kg)  Vital Signs: Temp: 98.1 F (36.7 C) (11/11 2111) Temp Source: Oral (11/11 2111) BP: 141/52 (11/11 2111) Pulse Rate: 80 (11/11 2111)  Labs: Recent Labs    07/27/17 1608 07/27/17 1925  HGB 10.3*  --   HCT 33.0*  --   PLT 214  --   LABPROT 21.3*  --   INR 1.87  --   CREATININE 8.57*  --   TROPONINI  --  <0.03    Estimated Creatinine Clearance: 7.2 mL/min (A) (by C-G formula based on SCr of 8.57 mg/dL (H)).   Medical History: Past Medical History:  Diagnosis Date  . Atrial fibrillation ([place])    on Coumadin  . DDD (degenerative disc disease)   . Diabetes mellitus without complication ([place])   . ESRD (end stage renal disease) on dialysis ([place])   . Hyperlipidemia   . Hypertension   . Melanoma ([place]) 2010   removed at [facility]  . Second degree heart block    a. s/p STJ dual chamber PPM - Dr [name]  . Stroke [facility]) 01/2017    Medications:  Medications Prior to Admission  Medication Sig Dispense Refill Last Dose  . acetaminophen (TYLENOL) 500 MG tablet Take 500 mg by mouth every 8 (eight) hours as needed for mild pain. (pain/headaches)   07/26/2017 at pm  . aspirin EC 81 MG tablet Take 81 mg by mouth at bedtime.   07/26/2017 at pm  . calcium acetate (PHOSLO) 667 MG capsule Take 667-2,001 mg See admin instructions by mouth. Take 3 capsules (2001 mg) by mouth three times daily with meals, take 1 capsule (667 mg) with snacks    07/27/2017 at lunch  . carvedilol (COREG) 25 MG tablet Take 25 mg 2 (two) times daily by mouth.   07/27/2017 at 830  . diphenhydrAMINE (BENADRYL) 25 MG tablet Take 25 mg at bedtime by mouth.   07/26/2017 at pm  . ezetimibe (ZETIA) 10 MG tablet Take 1 tablet (10 mg total) by mouth daily. 30 tablet 1 07/27/2017 at am  . fenofibrate 160 MG tablet Take 1 tablet (160 mg total) by mouth daily. 30 tablet 0 07/27/2017 at am  . finasteride (PROSCAR) 5 MG tablet Take 1 tablet (5 mg total) by mouth daily. 30 tablet 1 09/18/7251 at am  . folic acid-vitamin b complex-vitamin c-selenium-zinc (DIALYVITE) 3 MG TABS tablet Take 1 tablet at bedtime by mouth.   07/26/2017 at pm  . gabapentin (NEURONTIN) 100 MG capsule Take 1 capsule (100 mg total) by mouth at bedtime. 30 capsule 1 07/26/2017 at pm  . glimepiride (AMARYL) 1 MG tablet Take 1 tablet (1 mg total) by mouth daily with breakfast. 30 tablet 1 07/27/2017 at am  . Melatonin 3 MG TABS Take 3 mg at bedtime by mouth.   07/26/2017 at pm  . Nutritional Supplements (ENSURE PO) Take 237 mLs See admin instructions by mouth. Drink one can (237 mls)  by mouth daily on non-dialysis days (Sunday, Tuesday, Thursday, Saturday)   07/27/2017 at Unknown time  . predniSONE (DELTASONE) 20 MG tablet Take 1 tablet (20 mg total) by mouth daily with breakfast. Please note to continue taking 20 mg tablet for now  and you must be evaluated by your neurologist (Dr. [name]) to determine next Prednisone dose and frequency. 7 tablet 1 07/27/2017 at am  . warfarin (COUMADIN) 1 MG tablet 1 1/2 tab daily (Patient taking differently: Take 1 mg daily with supper by mouth. ) 30 tablet 1 07/26/2017 at 1700  . Amino Acids-Protein Hydrolys (FEEDING SUPPLEMENT, PRO-STAT SUGAR FREE 64,) LIQD Take 30 mLs by mouth 2 (two) times daily. (Patient not taking: Reported on 06/09/2017) 900 mL 0 Not Taking at Unknown time  . carvedilol (COREG) 3.125 MG tablet Take 1 tablet (3.125 mg total) by mouth 2 (two) times  daily with a meal. (Patient not taking: Reported on 07/27/2017) 60 tablet 1 Not Taking at Unknown time  . famotidine (PEPCID) 40 MG/5ML suspension Take 2.5 mLs (20 mg total) by mouth at bedtime. (Patient not taking: Reported on 06/09/2017) 50 mL 0 Not Taking at Unknown time  . ipratropium-albuterol (DUONEB) 0.5-2.5 (3) MG/3ML SOLN Take 3 mLs by nebulization every 6 (six) hours as needed. (Patient not taking: Reported on 06/09/2017) 360 mL 0 Not Taking at Unknown time  . LORazepam (ATIVAN) 1 MG tablet Take 1 tablet (1 mg total) by mouth every 6 (six) hours as needed for anxiety. (Patient not taking: Reported on 06/09/2017) 30 tablet 0 Not Taking at Unknown time  . multivitamin (RENA-VIT) TABS tablet Take 1 tablet by mouth at bedtime. (Patient not taking: Reported on 07/27/2017) 30 tablet 0 Not Taking at Unknown time  . Nutritional Supplements (FEEDING SUPPLEMENT, NEPRO CARB STEADY,) LIQD Take 237 mLs by mouth 2 (two) times daily between meals. (Patient not taking: Reported on 06/09/2017)  0 Not Taking at Unknown time  . traZODone (DESYREL) 50 MG tablet Take 1 tablet (50 mg total) by mouth at bedtime as needed for sleep. (Patient not taking: Reported on 06/09/2017)   Not Taking at Unknown time    Assessment: 81 y.o. male admitted with weakness/UTI, h/o Afib, to continue Coumadin  Goal of Therapy:  INR 2-3 Monitor platelets by anticoagulation protocol: Yes   Plan:  Coumadin 1.5 mg tonight Daily INR  Shannette Tabares, Bronson Curb 07/27/2017,11:53 PM

## 2017-07-27 NOTE — Progress Notes (Signed)
Pt arrived to [facility]. Alert and oriented x 4, identified appropriately. VS stable, no signs of acute distress. Pt oriented to room and equipment, instructed on how to use call bell, and call bell left within reach.  Will continue to monitor and treat pt per MD orders.

## 2017-07-27 NOTE — Progress Notes (Signed)
Pharmacy Antibiotic Note  James David is a 81 y.o. male admitted on 07/27/2017 with sepsis.  Pharmacy has been consulted for Vancomycin and Zosyn  dosing.  Plan: Vancomycin 1000 mg IV x1 now F/u weight and HD schedule and dose Vancomycin post HD Zosyn 3.375 g IV q12h  (4h infusion  Pre HD vancomycin levels prn.     Temp (24hrs), Avg:98.1 F (36.7 C), Min:98.1 F (36.7 C), Max:98.1 F (36.7 C)  Recent Labs  Lab 07/27/17 1608  WBC 13.4*  CREATININE 8.57*    CrCl cannot be calculated (Unknown ideal weight.).    Allergies  Allergen Reactions  . Amlodipine Besylate Swelling    Angioedema   . Lisinopril Other (See Comments)    Renal insufficiency  . Statins Other (See Comments)    Muscle aches  . Ropinirole Other (See Comments)    Causes excessive grogginess the following morning after taking  . Tape Other (See Comments)    SKIN IS THIN AND TAPE WILL ACTUALLY TEAR IT; PLEASE USE COBAN WRAP INSTEAD!!    Antimicrobials this admission: 11/11 Ceftriaxone 1g IV x1 Vanc 11/11>> Zosyn 11/11>>  Dose adjustments this admission: n/a  Microbiology results: 11/11 UCx: sent    Thank you for allowing pharmacy to be a part of this patient's care. Nicole Cella, RPh Clinical Pharmacist 07/27/2017 10:07 PM

## 2017-07-27 NOTE — ED Provider Notes (Signed)
[facility] EMERGENCY DEPARTMENT Provider Note   CSN: [id number] Arrival date & time: 07/27/17  1552     History   Chief Complaint Chief Complaint  Patient presents with  . Weakness    HPI James David is a 81 y.o. male.  HPI Patient presents to the emergency room for evaluation of generalized weakness.  Patient has history of myasthenia gravis.  He also has end-stage renal disease.  Last dialysis was friday.  Patient states he has been weaker than usual over the last several days.  Patient is not able to walk around.  He can sit in a wheelchair at baseline.  He feels that these activities are more difficult than usual.  His home health nurse checked on him and recommended he come to the emergency room.  He denies any trouble with any chest pain or shortness of breath.  No fevers.  No vomiting or diarrhea.  Patient is also complaining some soreness in his left elbow.  No known injuries. Past Medical History:  Diagnosis Date  . Atrial fibrillation ([place])   . Chronic kidney disease, stage IV (severe) (HCC)    baseline creatinine 2-3  . DDD (degenerative disc disease)   . Diabetes mellitus without complication ([place])   . ESRD (end stage renal disease) on dialysis ([place])   . Hyperlipidemia   . Hypertension   . Melanoma ([place]) 2010   removed at [facility]  . Second degree heart block    a. s/p STJ dual chamber PPM - Dr [name]  . Stroke [facility]) 01/2017    Patient Active Problem List   Diagnosis Date Noted  . Pressure injury of skin 06/11/2017  . Bleeding at insertion site 06/09/2017  . Myasthenia gravis ([place])   . Benign essential HTN   . Labile blood glucose   . Dysphagia   . Leukocytosis   . Pressure ulcer 03/18/2017  . Acute respiratory failure with hypoxia ([place])   . Myasthenia gravis in crisis ([place]) 03/17/2017  . Diabetes mellitus without complication ([place]) 16/96/7893  . History of CVA in adulthood 03/17/2017  . Atrial fibrillation, permanent ([place]) 03/17/2017    . Supratherapeutic INR 03/17/2017  . HTN (hypertension) 03/17/2017  . Dyslipidemia 03/17/2017  . ESRD on dialysis ([place]) 03/17/2017  . Anemia 03/17/2017  . Low back pain 02/28/2017  . Neck pain 02/28/2017  . Polyneuropathy 02/28/2017  . Gait disturbance, post-stroke   . Subcortical infarction ([place]) 02/06/2017  . Intracranial vascular stenosis 02/04/2017  . Syncope   . Orthostatic hypotension   . Stroke ([place]) 02/01/2017  . TIA (transient ischemic attack) 01/29/2017  . Hypertension 01/29/2017  . CKD (chronic kidney disease) stage V requiring chronic dialysis ([place]) 01/29/2017  . DDD (degenerative disc disease) 01/29/2017  . HLD (hyperlipidemia) 01/29/2017  . Second degree heart block 01/29/2017  . Atrial flutter ([place]) 01/29/2017  . ESRD (end stage renal disease) on dialysis ([place])   . Melanoma of skin ([place]) 03/13/2016  . Atrial flutter ([place]) 06/26/2015  . Pacemaker 10/13/2014  . End stage renal disease ([place]) 07/15/2014  . Chronic diastolic heart failure ([place]) 03/24/2014  . Second-degree heart block 12/13/2013  . Chronic kidney disease, stage IV (severe) ([place]) 12/13/2013  . Essential hypertension 12/13/2013  . Diabetes mellitus due to underlying condition with diabetic nephropathy ([place]) 12/13/2013  . Hyperlipidemia 12/13/2013    Past Surgical History:  Procedure Laterality Date  . CATARACT EXTRACTION W/ INTRAOCULAR LENS  IMPLANT, BILATERAL    . COLONOSCOPY    .  LAMINECTOMY         Home Medications    Prior to Admission medications   Medication Sig Start Date End Date Taking? Authorizing Provider  acetaminophen (TYLENOL) 500 MG tablet Take 500 mg by mouth every 8 (eight) hours as needed for mild pain. (pain/headaches)    [provider]  Amino Acids-Protein Hydrolys (FEEDING SUPPLEMENT, PRO-STAT SUGAR FREE 64,) LIQD Take 30 mLs by mouth 2 (two) times daily. Patient not taking: Reported on 06/09/2017 03/25/17   Theodis Blaze, MD  aspirin EC 81 MG tablet Take 81 mg  by mouth at bedtime.    [provider]  carvedilol (COREG) 3.125 MG tablet Take 1 tablet (3.125 mg total) by mouth 2 (two) times daily with a meal. 02/14/17   Angiulli, Lavon Paganini, PA-C  ezetimibe (ZETIA) 10 MG tablet Take 1 tablet (10 mg total) by mouth daily. Patient taking differently: Take 10 mg by mouth daily with supper.  02/14/17   Angiulli, Lavon Paganini, PA-C  famotidine (PEPCID) 40 MG/5ML suspension Take 2.5 mLs (20 mg total) by mouth at bedtime. Patient not taking: Reported on 06/09/2017 03/26/17   Theodis Blaze, MD  fenofibrate 160 MG tablet Take 1 tablet (160 mg total) by mouth daily. 02/14/17   Angiulli, Lavon Paganini, PA-C  finasteride (PROSCAR) 5 MG tablet Take 1 tablet (5 mg total) by mouth daily. 02/14/17   Angiulli, Lavon Paganini, PA-C  gabapentin (NEURONTIN) 100 MG capsule Take 1 capsule (100 mg total) by mouth at bedtime. 02/14/17   Angiulli, Lavon Paganini, PA-C  glimepiride (AMARYL) 1 MG tablet Take 1 tablet (1 mg total) by mouth daily with breakfast. 02/14/17   Angiulli, Lavon Paganini, PA-C  Hypromellose (ARTIFICIAL TEARS OP) Place 1 drop into both eyes 3 (three) times daily as needed (for dry eyes).     [provider]  ipratropium (ATROVENT) 0.03 % nasal spray Place 2 sprays into both nostrils daily.     [provider]  ipratropium-albuterol (DUONEB) 0.5-2.5 (3) MG/3ML SOLN Take 3 mLs by nebulization every 6 (six) hours as needed. Patient not taking: Reported on 06/09/2017 03/25/17   Theodis Blaze, MD  LORazepam (ATIVAN) 1 MG tablet Take 1 tablet (1 mg total) by mouth every 6 (six) hours as needed for anxiety. Patient not taking: Reported on 06/09/2017 03/25/17   Theodis Blaze, MD  multivitamin (RENA-VIT) TABS tablet Take 1 tablet by mouth at bedtime. 02/14/17   Angiulli, Lavon Paganini, PA-C  Nutritional Supplements (ENSURE PO) Take 237 mLs by mouth daily.    [provider]  Nutritional Supplements (FEEDING SUPPLEMENT, NEPRO CARB STEADY,) LIQD Take 237 mLs by mouth 2 (two) times  daily between meals. Patient not taking: Reported on 06/09/2017 03/25/17   Theodis Blaze, MD  predniSONE (DELTASONE) 20 MG tablet Take 1 tablet (20 mg total) by mouth daily with breakfast. Please note to continue taking 20 mg tablet for now  and you must be evaluated by your neurologist (Dr. [name]) to determine next Prednisone dose and frequency. Patient not taking: Reported on 06/09/2017 03/26/17   Bonnielee Haff, MD  traZODone (DESYREL) 50 MG tablet Take 1 tablet (50 mg total) by mouth at bedtime as needed for sleep. Patient not taking: Reported on 06/09/2017 03/25/17   Theodis Blaze, MD  warfarin (COUMADIN) 1 MG tablet 1 1/2 tab daily Patient taking differently: Take 1.5 mg by mouth daily with supper.  02/14/17   [place], Lavon Paganini, PA-C    Family History Family History  Problem Relation Age of Onset  . Pulmonary embolism Mother   . Diabetes Mother   . Heart attack Father   . Diabetes Brother     Social History Social History   Tobacco Use  . Smoking status: Never Smoker  . Smokeless tobacco: Never Used  Substance Use Topics  . Alcohol use: No  . Drug use: No     Allergies   Amlodipine besylate; Lisinopril; Statins; Ropinirole; and Tape   Review of Systems Review of Systems  All other systems reviewed and are negative.    Physical Exam Updated Vital Signs BP (!) 170/75   Pulse 93   Resp (!) 22   SpO2 100%   Physical Exam  Constitutional: No distress.  HENT:  Head: Normocephalic and atraumatic.  Right Ear: External ear normal.  Left Ear: External ear normal.  Eyes: Conjunctivae are normal. Right eye exhibits no discharge. Left eye exhibits no discharge. No scleral icterus.  Neck: Neck supple. No tracheal deviation present.  Cardiovascular: Normal rate, regular rhythm and intact distal pulses.  Pulmonary/Chest: Effort normal and breath sounds normal. No stridor. No respiratory distress. He has no wheezes. He has no rales.  Abdominal: Soft. Bowel sounds are  normal. He exhibits no distension. There is no tenderness. There is no rebound and no guarding.  Musculoskeletal: He exhibits no edema.       Left elbow: He exhibits no swelling and no effusion. Tenderness (mild erythema) found.  Neurological: He is alert. No cranial nerve deficit (no facial droop, extraocular movements intact, no slurred speech) or sensory deficit. He exhibits normal muscle tone. He displays no seizure activity. Coordination normal.  Generalized weakness, equal grip strengths bilateral upper and lower extremity  Skin: Skin is warm and dry. No rash noted. He is not diaphoretic.  Psychiatric: He has a normal mood and affect.  Nursing note and vitals reviewed.    ED Treatments / Results  Labs (all labs ordered are listed, but only abnormal results are displayed) Labs Reviewed  COMPREHENSIVE METABOLIC PANEL - Abnormal; Notable for the following components:      Result Value   Sodium 130 (*)    Potassium 5.5 (*)    Chloride 92 (*)    Glucose, Bld 406 (*)    BUN 69 (*)    Creatinine, Ser 8.57 (*)    Total Protein 5.8 (*)    Albumin 2.9 (*)    GFR calc non Af Amer 5 (*)    GFR calc Af Amer 6 (*)    All other components within normal limits  CBC WITH DIFFERENTIAL/PLATELET - Abnormal; Notable for the following components:   WBC 13.4 (*)    RBC 3.14 (*)    Hemoglobin 10.3 (*)    HCT 33.0 (*)    MCV 105.1 (*)    RDW 20.5 (*)    Neutro Abs 12.2 (*)    All other components within normal limits  URINALYSIS, DIPSTICK ONLY - Abnormal; Notable for the following components:   APPearance TURBID (*)    Glucose, UA >=500 (*)    Hgb urine dipstick SMALL (*)    Protein, ur 100 (*)    Leukocytes, UA LARGE (*)    All other components within normal limits  PROTIME-INR - Abnormal; Notable for the following components:   Prothrombin Time 21.3 (*)    All other components within normal limits  URINALYSIS, ROUTINE W REFLEX MICROSCOPIC - Abnormal; Notable for the following  components:   Color, Urine AMBER (*)  APPearance TURBID (*)    Glucose, UA >=500 (*)    Hgb urine dipstick SMALL (*)    Protein, ur 100 (*)    Leukocytes, UA LARGE (*)    All other components within normal limits  URINE CULTURE  TROPONIN I    EKG  EKG Interpretation  Date/Time:  Sunday July 27 2017 16:01:10 EST Ventricular Rate:  89 PR Interval:    QRS Duration: 158 QT Interval:  422 QTC Calculation: 514 R Axis:   -85 Text Interpretation:  Sinus rhythm LAE, consider biatrial enlargement IVCD, consider atypical RBBB Left ventricular hypertrophy Anterolateral infarct, old sinus rhythm appears to have replaced paced rhythm No significant change since last tracing Confirmed by Dorie Rank [id number]) on 07/27/2017 4:21:49 PM       Radiology Dg Chest 2 View  Result Date: 07/27/2017 CLINICAL DATA:  81 y/o  M; weakness. EXAM: CHEST  2 VIEW COMPARISON:  03/21/2017 CT head FINDINGS: Stable cardiomegaly. Aortic atherosclerosis with calcification. Clear lungs. No pleural effusion or pneumothorax. No acute osseous abnormality is evident. Two lead pacemaker noted. Surgical clips project over left axilla. Right axillary vascular stent. IMPRESSION: Stable cardiomegaly.  No acute pulmonary process identified. Electronically Signed   By: Kristine Garbe M.D.   On: 07/27/2017 17:37   Dg Elbow Complete Left  Result Date: 07/27/2017 CLINICAL DATA:  81 y/o  M; elbow pain. EXAM: LEFT ELBOW - COMPLETE 3+ VIEW COMPARISON:  None. FINDINGS: [place] in radial head on a single oblique view and elevated anterior fat pad, question radial head fracture. Calcification adjacent to the lateral epicondyles compatible with lateral epicondylitis. Vascular calcifications. No joint dislocation. IMPRESSION: Luc[place] in radial head on single view and elevated anterior fat pad, question radial head fracture. Electronically Signed   By: Kristine Garbe M.D.   On: 07/27/2017 17:26     Procedures Procedures (including critical care time)  Medications Ordered in ED Medications  0.9 %  sodium chloride infusion ( Intravenous New Bag/Given 07/27/17 1619)  insulin aspart (novoLOG) injection 8 Units (not administered)  cefTRIAXone (ROCEPHIN) 1 g in dextrose 5 % 50 mL IVPB (1 g Intravenous New Bag/Given 07/27/17 1823)     Initial Impression / Assessment and Plan / ED Course  I have reviewed the triage vital signs and the nursing notes.  Pertinent labs & imaging results that were available during my care of the patient were reviewed by me and considered in my medical decision making (see chart for details).   Patient presented to the emergency room with complaints of increasing weakness.  His laboratory tests were notable for abnormal urinalysis consistent with a urinary tract infection.  Patient also is hyperglycemic.  His also has electrolyte abnormalities that are consistent with his chronic renal failure but no any emergent issue tonight.  Elbow films were reviewed with the patient and his wife.  He denies any recent injuries.  I doubt that he has a radial head fracture.  I did discuss this finding with the patient and his wife and recommended he consider follow-up with an orthopedic doctor.  He might have a mild bursitis based on his exam.  Plan on admission to the hospital for further treatment.  Final Clinical Impressions(s) / ED Diagnoses   Final diagnoses:  Acute cystitis without hematuria  End stage kidney disease ([place])      Dorie Rank, MD 07/27/17 [phone]

## 2017-07-28 ENCOUNTER — Encounter (HOSPITAL_COMMUNITY): Payer: Self-pay | Admitting: General Practice

## 2017-07-28 DIAGNOSIS — E0821 Diabetes mellitus due to underlying condition with diabetic nephropathy: Secondary | ICD-10-CM

## 2017-07-28 DIAGNOSIS — R531 Weakness: Secondary | ICD-10-CM | POA: Diagnosis not present

## 2017-07-28 DIAGNOSIS — I10 Essential (primary) hypertension: Secondary | ICD-10-CM

## 2017-07-28 DIAGNOSIS — N390 Urinary tract infection, site not specified: Secondary | ICD-10-CM

## 2017-07-28 DIAGNOSIS — I482 Chronic atrial fibrillation: Secondary | ICD-10-CM | POA: Diagnosis not present

## 2017-07-28 DIAGNOSIS — D649 Anemia, unspecified: Secondary | ICD-10-CM

## 2017-07-28 DIAGNOSIS — Z992 Dependence on renal dialysis: Secondary | ICD-10-CM | POA: Diagnosis not present

## 2017-07-28 DIAGNOSIS — M25522 Pain in left elbow: Secondary | ICD-10-CM | POA: Diagnosis not present

## 2017-07-28 DIAGNOSIS — N3000 Acute cystitis without hematuria: Secondary | ICD-10-CM | POA: Diagnosis not present

## 2017-07-28 DIAGNOSIS — N186 End stage renal disease: Secondary | ICD-10-CM | POA: Diagnosis not present

## 2017-07-28 DIAGNOSIS — G7000 Myasthenia gravis without (acute) exacerbation: Secondary | ICD-10-CM | POA: Diagnosis not present

## 2017-07-28 HISTORY — DX: Anemia, unspecified: D64.9

## 2017-07-28 LAB — PROTIME-INR
INR: 2.21
PROTHROMBIN TIME: 24.3 s — AB (ref 11.4–15.2)

## 2017-07-28 LAB — BASIC METABOLIC PANEL
ANION GAP: 13 (ref 5–15)
BUN: 77 mg/dL — ABNORMAL HIGH (ref 6–20)
CALCIUM: 9.3 mg/dL (ref 8.9–10.3)
CO2: 24 mmol/L (ref 22–32)
Chloride: 98 mmol/L — ABNORMAL LOW (ref 101–111)
Creatinine, Ser: 9.05 mg/dL — ABNORMAL HIGH (ref 0.61–1.24)
GFR, EST AFRICAN AMERICAN: 6 mL/min — AB (ref >60)
GFR, EST NON AFRICAN AMERICAN: 5 mL/min — AB (ref >60)
GLUCOSE: 58 mg/dL — AB (ref 65–99)
Potassium: 4.7 mmol/L (ref 3.5–5.1)
Sodium: 135 mmol/L (ref 135–145)

## 2017-07-28 LAB — CBC
HCT: 30.9 % — ABNORMAL LOW (ref 39.0–52.0)
HEMOGLOBIN: 9.9 g/dL — AB (ref 13.0–17.0)
MCH: 33 pg (ref 26.0–34.0)
MCHC: 32 g/dL (ref 30.0–36.0)
MCV: 103 fL — ABNORMAL HIGH (ref 78.0–100.0)
Platelets: 150 10*3/uL (ref 150–400)
RBC: 3 MIL/uL — AB (ref 4.22–5.81)
RDW: 20.5 % — ABNORMAL HIGH (ref 11.5–15.5)
WBC: 9.9 10*3/uL (ref 4.0–10.5)

## 2017-07-28 LAB — GLUCOSE, CAPILLARY
GLUCOSE-CAPILLARY: 52 mg/dL — AB (ref 65–99)
GLUCOSE-CAPILLARY: 226 mg/dL — AB (ref 65–99)
Glucose-Capillary: 132 mg/dL — ABNORMAL HIGH (ref 65–99)
Glucose-Capillary: 196 mg/dL — ABNORMAL HIGH (ref 65–99)

## 2017-07-28 LAB — SEDIMENTATION RATE: SED RATE: 57 mm/h — AB (ref 0–16)

## 2017-07-28 LAB — MRSA PCR SCREENING: MRSA by PCR: NEGATIVE

## 2017-07-28 MED ORDER — LIDOCAINE HCL 1 % IJ SOLN
5.0000 mL | Freq: Once | INTRAMUSCULAR | Status: DC
  Filled 2017-07-28: qty 5

## 2017-07-28 MED ORDER — SODIUM CHLORIDE 0.9 % IV SOLN
62.5000 mg | INTRAVENOUS | Status: DC
  Administered 2017-07-30: 62.5 mg via INTRAVENOUS
  Filled 2017-07-28 (×2): qty 5

## 2017-07-28 MED ORDER — DEXTROSE 50 % IV SOLN
INTRAVENOUS | Status: AC
  Administered 2017-07-28: 25 mL
  Filled 2017-07-28: qty 50

## 2017-07-28 MED ORDER — DOXERCALCIFEROL 4 MCG/2ML IV SOLN
4.0000 ug | INTRAVENOUS | Status: DC
  Administered 2017-07-28 – 2017-07-30 (×2): 4 ug via INTRAVENOUS
  Filled 2017-07-28 (×2): qty 2

## 2017-07-28 MED ORDER — HEPARIN SODIUM (PORCINE) 1000 UNIT/ML DIALYSIS: 1000.0000 [IU] | INTRAMUSCULAR | Status: DC | PRN

## 2017-07-28 MED ORDER — BUPIVACAINE HCL (PF) 0.5 % IJ SOLN
10.0000 mL | Freq: Once | INTRAMUSCULAR | Status: DC
  Filled 2017-07-28: qty 10

## 2017-07-28 MED ORDER — ACETAMINOPHEN 325 MG PO TABS
ORAL_TABLET | ORAL | Status: AC
  Administered 2017-07-28: 650 mg via ORAL
  Filled 2017-07-28: qty 2

## 2017-07-28 MED ORDER — WARFARIN SODIUM 1 MG PO TABS
1.0000 mg | ORAL_TABLET | Freq: Once | ORAL | Status: AC
  Administered 2017-07-28: 1 mg via ORAL
  Filled 2017-07-28: qty 1

## 2017-07-28 MED ORDER — ALTEPLASE 2 MG IJ SOLR: 2.0000 mg | Freq: Once | INTRAMUSCULAR | Status: DC | PRN

## 2017-07-28 MED ORDER — LIDOCAINE HCL (PF) 1 % IJ SOLN: 5.0000 mL | INTRAMUSCULAR | Status: DC | PRN

## 2017-07-28 MED ORDER — PENTAFLUOROPROP-TETRAFLUOROETH EX AERO: 1.0000 "application " | INHALATION_SPRAY | CUTANEOUS | Status: DC | PRN

## 2017-07-28 MED ORDER — VANCOMYCIN HCL IN DEXTROSE 750-5 MG/150ML-% IV SOLN
INTRAVENOUS | Status: AC
  Administered 2017-07-28: 750 mg via INTRAVENOUS
  Filled 2017-07-28: qty 150

## 2017-07-28 MED ORDER — SODIUM CHLORIDE 0.9 % IV SOLN
125.0000 mg | Freq: Once | INTRAVENOUS | Status: AC
  Administered 2017-07-28: 125 mg via INTRAVENOUS
  Filled 2017-07-28: qty 10

## 2017-07-28 MED ORDER — COLLAGENASE 250 UNIT/GM EX OINT
TOPICAL_OINTMENT | Freq: Every day | CUTANEOUS | Status: DC
  Administered 2017-07-28 – 2017-07-31 (×4): via TOPICAL
  Filled 2017-07-28: qty 30

## 2017-07-28 MED ORDER — WARFARIN - PHARMACIST DOSING INPATIENT
Freq: Every day | Status: DC
  Administered 2017-07-28 – 2017-07-30 (×3)

## 2017-07-28 MED ORDER — DARBEPOETIN ALFA 150 MCG/0.3ML IJ SOSY: 150.0000 ug | PREFILLED_SYRINGE | INTRAMUSCULAR | Status: DC

## 2017-07-28 MED ORDER — SODIUM CHLORIDE 0.9 % IV SOLN: 100.0000 mL | INTRAVENOUS | Status: DC | PRN

## 2017-07-28 MED ORDER — DOXERCALCIFEROL 4 MCG/2ML IV SOLN
INTRAVENOUS | Status: AC
  Administered 2017-07-28: 4 ug via INTRAVENOUS
  Filled 2017-07-28: qty 2

## 2017-07-28 MED ORDER — LIDOCAINE-PRILOCAINE 2.5-2.5 % EX CREA: 1.0000 "application " | TOPICAL_CREAM | CUTANEOUS | Status: DC | PRN

## 2017-07-28 NOTE — Consult Note (Signed)
Renal Service Consult Note [facility]  James David 07/28/2017 Sol Blazing Requesting Physician:  Dr Eliseo Squires  Reason for Consult:  ESRD pt w/ poss myasthenia flare HPI: The patient is a 81 y.o. year-old with hx of ESRD, DM, myasthenia gravis , CVA ni 2016, PPM, parox afib and HL, admitted yesterday for acute on chronic gen'd weakness.  Was dx'd with M. Gravis recently and has developed subsequent gait failure .  Pt hasn't been able to participate in his usual activities at home and walking has become more problematic.  Asked to see for ESRD.   Patient is compliant with hx HD schedule.  No SOB, abd pain, CP.  Had recent procedure in September on his R aVF by Dr Donzetta Matters with balloon angio of an in-stent stenosis, and also drug-coated balloon angioplasty of R arm cephalic vein.   ROS  denies CP  no joint pain   no HA  no blurry vision  no rash  no diarrhea  no nausea/ vomiting   Past Medical History  Past Medical History:  Diagnosis Date  . Atrial fibrillation ([place])    on Coumadin  . DDD (degenerative disc disease)   . Diabetes mellitus without complication ([place])   . ESRD (end stage renal disease) on dialysis ([place])   . Hyperlipidemia   . Hypertension   . Melanoma ([place]) 2010   removed at [facility]  . Second degree heart block    a. s/p STJ dual chamber PPM - Dr [name]  . Stroke [facility]) 01/2017   Past Surgical History  Past Surgical History:  Procedure Laterality Date  . CATARACT EXTRACTION W/ INTRAOCULAR LENS  IMPLANT, BILATERAL    . COLONOSCOPY    . LAMINECTOMY     Family History  Family History  Problem Relation Age of Onset  . Pulmonary embolism Mother   . Diabetes Mother   . Heart attack Father   . Diabetes Brother    Social History  reports that  has never smoked. he has never used smokeless tobacco. He reports that he does not drink alcohol or use drugs. Allergies  Allergies  Allergen Reactions  . Amlodipine Besylate Swelling    Angioedema   .  Lisinopril Other (See Comments)    Renal insufficiency  . Statins Other (See Comments)    Muscle aches  . Ropinirole Other (See Comments)    Causes excessive grogginess the following morning after taking  . Tape Other (See Comments)    SKIN IS THIN AND TAPE WILL ACTUALLY TEAR IT; PLEASE USE COBAN WRAP INSTEAD!!   Home medications Prior to Admission medications   Medication Sig Start Date End Date Taking? Authorizing Provider  acetaminophen (TYLENOL) 500 MG tablet Take 500 mg by mouth every 8 (eight) hours as needed for mild pain. (pain/headaches)   Yes [provider]  aspirin EC 81 MG tablet Take 81 mg by mouth at bedtime.   Yes [provider]  calcium acetate (PHOSLO) 667 MG capsule Take 667-2,001 mg See admin instructions by mouth. Take 3 capsules (2001 mg) by mouth three times daily with meals, take 1 capsule (667 mg) with snacks   Yes [provider]  carvedilol (COREG) 25 MG tablet Take 25 mg 2 (two) times daily by mouth. 06/12/17  Yes [provider]  diphenhydrAMINE (BENADRYL) 25 MG tablet Take 25 mg at bedtime by mouth.   Yes [provider]  ezetimibe (ZETIA) 10 MG tablet Take 1 tablet (10 mg total) by mouth daily.  02/14/17  Yes Angiulli, Lavon Paganini, PA-C  fenofibrate 160 MG tablet Take 1 tablet (160 mg total) by mouth daily. 02/14/17  Yes Angiulli, Lavon Paganini, PA-C  finasteride (PROSCAR) 5 MG tablet Take 1 tablet (5 mg total) by mouth daily. 02/14/17  Yes Angiulli, Lavon Paganini, PA-C  folic acid-vitamin b complex-vitamin c-selenium-zinc (DIALYVITE) 3 MG TABS tablet Take 1 tablet at bedtime by mouth.   Yes [provider]  gabapentin (NEURONTIN) 100 MG capsule Take 1 capsule (100 mg total) by mouth at bedtime. 02/14/17  Yes Angiulli, Lavon Paganini, PA-C  glimepiride (AMARYL) 1 MG tablet Take 1 tablet (1 mg total) by mouth daily with breakfast. 02/14/17  Yes Angiulli, Lavon Paganini, PA-C  Melatonin 3 MG TABS Take 3 mg at bedtime by mouth.   Yes [provider]  Nutritional Supplements (ENSURE PO) Take 237 mLs See admin instructions by mouth. Drink one can (237 mls) by mouth daily on non-dialysis days (Sunday, Tuesday, Thursday, Saturday)   Yes [provider]  predniSONE (DELTASONE) 20 MG tablet Take 1 tablet (20 mg total) by mouth daily with breakfast. Please note to continue taking 20 mg tablet for now  and you must be evaluated by your neurologist (Dr. [name]) to determine next Prednisone dose and frequency. 03/26/17  Yes Bonnielee Haff, MD  warfarin (COUMADIN) 1 MG tablet 1 1/2 tab daily Patient taking differently: Take 1 mg daily with supper by mouth.  02/14/17  Yes Angiulli, Lavon Paganini, PA-C  Amino Acids-Protein Hydrolys (FEEDING SUPPLEMENT, PRO-STAT SUGAR FREE 64,) LIQD Take 30 mLs by mouth 2 (two) times daily. Patient not taking: Reported on 06/09/2017 03/25/17   Theodis Blaze, MD  carvedilol (COREG) 3.125 MG tablet Take 1 tablet (3.125 mg total) by mouth 2 (two) times daily with a meal. Patient not taking: Reported on 07/27/2017 02/14/17   Angiulli, Lavon Paganini, PA-C  famotidine (PEPCID) 40 MG/5ML suspension Take 2.5 mLs (20 mg total) by mouth at bedtime. Patient not taking: Reported on 06/09/2017 03/26/17   Theodis Blaze, MD  ipratropium-albuterol (DUONEB) 0.5-2.5 (3) MG/3ML SOLN Take 3 mLs by nebulization every 6 (six) hours as needed. Patient not taking: Reported on 06/09/2017 03/25/17   Theodis Blaze, MD  LORazepam (ATIVAN) 1 MG tablet Take 1 tablet (1 mg total) by mouth every 6 (six) hours as needed for anxiety. Patient not taking: Reported on 06/09/2017 03/25/17   Theodis Blaze, MD  multivitamin (RENA-VIT) TABS tablet Take 1 tablet by mouth at bedtime. Patient not taking: Reported on 07/27/2017 02/14/17   Angiulli, Lavon Paganini, PA-C  Nutritional Supplements (FEEDING SUPPLEMENT, NEPRO CARB STEADY,) LIQD Take 237 mLs by mouth 2 (two) times daily between meals. Patient not taking: Reported on 06/09/2017 03/25/17   Theodis Blaze, MD   traZODone (DESYREL) 50 MG tablet Take 1 tablet (50 mg total) by mouth at bedtime as needed for sleep. Patient not taking: Reported on 06/09/2017 03/25/17   Theodis Blaze, MD   Liver Function Tests Recent Labs  Lab 07/27/17 1608  AST 20  ALT 22  ALKPHOS 45  BILITOT 0.6  PROT 5.8*  ALBUMIN 2.9*   No results for input(s): LIPASE, AMYLASE in the last 168 hours. CBC Recent Labs  Lab 07/27/17 1608 07/28/17 0750  WBC 13.4* 9.9  NEUTROABS 12.2*  --   HGB 10.3* 9.9*  HCT 33.0* 30.9*  MCV 105.1* 103.0*  PLT 214 557   Basic Metabolic Panel Recent Labs  Lab 07/27/17 1608 07/28/17 0750  NA  130* 135  K 5.5* 4.7  CL 92* 98*  CO2 25 24  GLUCOSE 406* 58*  BUN 69* 77*  CREATININE 8.57* 9.05*  CALCIUM 9.8 9.3   Iron/TIBC/Ferritin/ %Sat    Component Value Date/Time   IRON 26 (L) 03/18/2017 1017   TIBC 211 (L) 03/18/2017 1017   FERRITIN 457 (H) 03/18/2017 1017   IRONPCTSAT 12 (L) 03/18/2017 1017    Vitals:   07/27/17 2111 07/27/17 2300 07/28/17 0603 07/28/17 0948  BP: (!) 141/52  121/60 121/60  Pulse: 80  70   Resp: 18  18   Temp: 98.1 F (36.7 C)  98 F (36.7 C)   TempSrc: Oral  Oral   SpO2: 100%  98%   Weight:  80.3 kg (177 lb)     Exam Gen alert, bruised on both UE's, no distress, a bit weak, up in chair and responding w/o difficulty.  No sob or ^WOB No rash, cyanosis or gangrene Sclera anicteric, throat clear No jvd or bruits Chest clear bilat RRR soft SEM no rg Abd soft ntnd no mass or ascites +bs GU normal male MS no joint effusions or deformity Ext no sig LE edema / no wounds or ulcers Neuro is alert, Ox 3 , nf     Dialysis: MWF [place] ([facility]) 4h   76kg   2/2.25 bath  AVF RUA  Hep none (on coumadin)  Dry wt ?    Impression: 1. Acute on chronic gen'd weakness - w/ hx of myasthenia gravis.  2. Abnormal UA/ possible UTI - hx UTI in June and July this year 3. Myasthenia gravis - per primary, on prednisone 4. ESRD - on HD MWF. Mild vol  excess, +4kg 5. DM - per primary 6. AFib on coumadin 7. 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

## 2017-07-28 NOTE — Consult Note (Signed)
Reason for Consult:Left elbow pain Referring Physician: Lanier Millon is an 81 y.o. male with multiple medical problems including myasthenia, DM, ESRD on HD, and hx/o CVA among others. HPI: James David was admitted with increasing weakness 2/2 to his myasthenia. He had slid out of a chair and family was unable to get him back into it. He denied any upper extremity trauma during that fall. His left elbow had begun hurting him a day or two before (about 4d total now) without antecedent trauma. It is maybe slightly better now than when it started. He describes pain with use of the arm (no particular motion) and no pain at rest. No prior history of similar and no history of gout. No fevers, chills, sweats, N/V, or N/T. He is RHD.  Past Medical History:  Diagnosis Date  . Atrial fibrillation ([place])    on Coumadin  . DDD (degenerative disc disease)   . Diabetes mellitus without complication ([place])   . ESRD (end stage renal disease) on dialysis ([place])   . Hyperlipidemia   . Hypertension   . Melanoma ([place]) 2010   removed at [facility]  . Second degree heart block    a. s/p STJ dual chamber PPM - Dr [name]  . Stroke [facility]) 01/2017    Past Surgical History:  Procedure Laterality Date  . CATARACT EXTRACTION W/ INTRAOCULAR LENS  IMPLANT, BILATERAL    . COLONOSCOPY    . LAMINECTOMY      Family History  Problem Relation Age of Onset  . Pulmonary embolism Mother   . Diabetes Mother   . Heart attack Father   . Diabetes Brother     Social History:  reports that  has never smoked. he has never used smokeless tobacco. He reports that he does not drink alcohol or use drugs.  Allergies:  Allergies  Allergen Reactions  . Amlodipine Besylate Swelling    Angioedema   . Lisinopril Other (See Comments)    Renal insufficiency  . Statins Other (See Comments)    Muscle aches  . Ropinirole Other (See Comments)    Causes excessive grogginess the following morning after taking  . Tape Other (See  Comments)    SKIN IS THIN AND TAPE WILL ACTUALLY TEAR IT; PLEASE USE COBAN WRAP INSTEAD!!    Medications: I have reviewed the patient's current medications.  Results for orders placed or performed during the hospital encounter of 07/27/17 (from the past 48 hour(s))  Comprehensive metabolic panel     Status: Abnormal   Collection Time: 07/27/17  4:08 PM  Result Value Ref Range   Sodium 130 (L) 135 - 145 mmol/L   Potassium 5.5 (H) 3.5 - 5.1 mmol/L   Chloride 92 (L) 101 - 111 mmol/L   CO2 25 22 - 32 mmol/L   Glucose, Bld 406 (H) 65 - 99 mg/dL   BUN 69 (H) 6 - 20 mg/dL   Creatinine, Ser 8.57 (H) 0.61 - 1.24 mg/dL   Calcium 9.8 8.9 - 10.3 mg/dL   Total Protein 5.8 (L) 6.5 - 8.1 g/dL   Albumin 2.9 (L) 3.5 - 5.0 g/dL   AST 20 15 - 41 U/L   ALT 22 17 - 63 U/L   Alkaline Phosphatase 45 38 - 126 U/L   Total Bilirubin 0.6 0.3 - 1.2 mg/dL   GFR calc non Af Amer 5 (L) >60 mL/min   GFR calc Af Amer 6 (L) >60 mL/min    Comment: (NOTE) The eGFR has been  calculated using the CKD EPI equation. This calculation has not been validated in all clinical situations. eGFR's persistently <60 mL/min signify possible Chronic Kidney Disease.    Anion gap 13 5 - 15  CBC with Differential     Status: Abnormal   Collection Time: 07/27/17  4:08 PM  Result Value Ref Range   WBC 13.4 (H) 4.0 - 10.5 K/uL   RBC 3.14 (L) 4.22 - 5.81 MIL/uL   Hemoglobin 10.3 (L) 13.0 - 17.0 g/dL   HCT 33.0 (L) 39.0 - 52.0 %   MCV 105.1 (H) 78.0 - 100.0 fL   MCH 32.8 26.0 - 34.0 pg   MCHC 31.2 30.0 - 36.0 g/dL   RDW 20.5 (H) 11.5 - 15.5 %   Platelets 214 150 - 400 K/uL   Neutrophils Relative % 91 %   Neutro Abs 12.2 (H) 1.7 - 7.7 K/uL   Lymphocytes Relative 5 %   Lymphs Abs 0.7 0.7 - 4.0 K/uL   Monocytes Relative 4 %   Monocytes Absolute 0.5 0.1 - 1.0 K/uL   Eosinophils Relative 0 %   Eosinophils Absolute 0.0 0.0 - 0.7 K/uL   Basophils Relative 0 %   Basophils Absolute 0.0 0.0 - 0.1 K/uL  Protime-INR     Status:  Abnormal   Collection Time: 07/27/17  4:08 PM  Result Value Ref Range   Prothrombin Time 21.3 (H) 11.4 - 15.2 seconds   INR 1.87   Urinalysis, dipstick only     Status: Abnormal   Collection Time: 07/27/17  4:09 PM  Result Value Ref Range   Color, Urine YELLOW YELLOW   APPearance TURBID (A) CLEAR   Specific Gravity, Urine 1.010 1.005 - 1.030   pH 8.0 5.0 - 8.0   Glucose, UA >=500 (A) NEGATIVE mg/dL   Hgb urine dipstick SMALL (A) NEGATIVE   Bilirubin Urine NEGATIVE NEGATIVE   Ketones, ur NEGATIVE NEGATIVE mg/dL   Protein, ur 100 (A) NEGATIVE mg/dL   Nitrite NEGATIVE NEGATIVE   Leukocytes, UA LARGE (A) NEGATIVE  Urinalysis, Routine w reflex microscopic     Status: Abnormal   Collection Time: 07/27/17  4:09 PM  Result Value Ref Range   Color, Urine AMBER (A) YELLOW    Comment: BIOCHEMICALS MAY BE AFFECTED BY COLOR   APPearance TURBID (A) CLEAR   Specific Gravity, Urine 1.010 1.005 - 1.030   pH 8.0 5.0 - 8.0   Glucose, UA >=500 (A) NEGATIVE mg/dL   Hgb urine dipstick SMALL (A) NEGATIVE   Bilirubin Urine NEGATIVE NEGATIVE   Ketones, ur NEGATIVE NEGATIVE mg/dL   Protein, ur 100 (A) NEGATIVE mg/dL   Nitrite NEGATIVE NEGATIVE   Leukocytes, UA LARGE (A) NEGATIVE   RBC / HPF NONE SEEN 0 - 5 RBC/hpf   WBC, UA TOO NUMEROUS TO COUNT 0 - 5 WBC/hpf   Bacteria, UA NONE SEEN NONE SEEN   Squamous Epithelial / LPF NONE SEEN NONE SEEN  Troponin I     Status: None   Collection Time: 07/27/17  7:25 PM  Result Value Ref Range   Troponin I <0.03 <0.03 ng/mL  Uric acid     Status: None   Collection Time: 07/27/17 10:25 PM  Result Value Ref Range   Uric Acid, Serum 7.1 4.4 - 7.6 mg/dL  Sedimentation rate     Status: Abnormal   Collection Time: 07/27/17 10:25 PM  Result Value Ref Range   Sed Rate 57 (H) 0 - 16 mm/hr  C-reactive protein  Status: Abnormal   Collection Time: 07/27/17 10:25 PM  Result Value Ref Range   CRP 6.9 (H) <1.0 mg/dL  Glucose, capillary     Status: Abnormal    Collection Time: 07/27/17 11:07 PM  Result Value Ref Range   Glucose-Capillary 206 (H) 65 - 99 mg/dL  MRSA PCR Screening     Status: None   Collection Time: 07/28/17 12:19 AM  Result Value Ref Range   MRSA by PCR NEGATIVE NEGATIVE    Comment:        The GeneXpert MRSA Assay (FDA approved for NASAL specimens only), is one component of a comprehensive MRSA colonization surveillance program. It is not intended to diagnose MRSA infection nor to guide or monitor treatment for MRSA infections.   Basic metabolic panel     Status: Abnormal   Collection Time: 07/28/17  7:50 AM  Result Value Ref Range   Sodium 135 135 - 145 mmol/L   Potassium 4.7 3.5 - 5.1 mmol/L   Chloride 98 (L) 101 - 111 mmol/L   CO2 24 22 - 32 mmol/L   Glucose, Bld 58 (L) 65 - 99 mg/dL   BUN 77 (H) 6 - 20 mg/dL   Creatinine, Ser 9.05 (H) 0.61 - 1.24 mg/dL   Calcium 9.3 8.9 - 10.3 mg/dL   GFR calc non Af Amer 5 (L) >60 mL/min   GFR calc Af Amer 6 (L) >60 mL/min    Comment: (NOTE) The eGFR has been calculated using the CKD EPI equation. This calculation has not been validated in all clinical situations. eGFR's persistently <60 mL/min signify possible Chronic Kidney Disease.    Anion gap 13 5 - 15  CBC     Status: Abnormal   Collection Time: 07/28/17  7:50 AM  Result Value Ref Range   WBC 9.9 4.0 - 10.5 K/uL   RBC 3.00 (L) 4.22 - 5.81 MIL/uL   Hemoglobin 9.9 (L) 13.0 - 17.0 g/dL   HCT 30.9 (L) 39.0 - 52.0 %   MCV 103.0 (H) 78.0 - 100.0 fL   MCH 33.0 26.0 - 34.0 pg   MCHC 32.0 30.0 - 36.0 g/dL   RDW 20.5 (H) 11.5 - 15.5 %   Platelets 150 150 - 400 K/uL  Protime-INR     Status: Abnormal   Collection Time: 07/28/17  7:50 AM  Result Value Ref Range   Prothrombin Time 24.3 (H) 11.4 - 15.2 seconds   INR 2.21     Dg Chest 2 View  Result Date: 07/27/2017 CLINICAL DATA:  81 y/o  M; weakness. EXAM: CHEST  2 VIEW COMPARISON:  03/21/2017 CT head FINDINGS: Stable cardiomegaly. Aortic atherosclerosis with  calcification. Clear lungs. No pleural effusion or pneumothorax. No acute osseous abnormality is evident. Two lead pacemaker noted. Surgical clips project over left axilla. Right axillary vascular stent. IMPRESSION: Stable cardiomegaly.  No acute pulmonary process identified. Electronically Signed   By: Kristine Garbe M.D.   On: 07/27/2017 17:37   Dg Elbow Complete Left  Result Date: 07/27/2017 CLINICAL DATA:  81 y/o  M; elbow pain. EXAM: LEFT ELBOW - COMPLETE 3+ VIEW COMPARISON:  None. FINDINGS: [place] in radial head on a single oblique view and elevated anterior fat pad, question radial head fracture. Calcification adjacent to the lateral epicondyles compatible with lateral epicondylitis. Vascular calcifications. No joint dislocation. IMPRESSION: Lucency in radial head on single view and elevated anterior fat pad, question radial head fracture. Electronically Signed   By: Kristine Garbe M.D.   On:  07/27/2017 17:26    Review of Systems  Constitutional: Negative for weight loss.  HENT: Negative for ear discharge, ear pain, hearing loss and tinnitus.   Eyes: Negative for blurred vision, double vision, photophobia and pain.  Respiratory: Negative for cough, sputum production and shortness of breath.   Cardiovascular: Negative for chest pain.  Gastrointestinal: Negative for abdominal pain, nausea and vomiting.  Genitourinary: Negative for dysuria, flank pain, frequency and urgency.  Musculoskeletal: Positive for joint pain (Left elbow). Negative for back pain, falls, myalgias and neck pain.  Neurological: Positive for weakness. Negative for dizziness, tingling, sensory change, focal weakness, loss of consciousness and headaches.  Endo/Heme/Allergies: Does not bruise/bleed easily.  Psychiatric/Behavioral: Negative for depression, memory loss and substance abuse. The patient is not nervous/anxious.    Blood pressure 121/60, pulse 70, temperature 98 F (36.7 C), temperature  source Oral, resp. rate 18, weight 80.3 kg (177 lb), SpO2 98 %. Physical Exam  Constitutional: He appears well-developed and well-nourished. No distress.  HENT:  Head: Normocephalic and atraumatic.  Eyes: Conjunctivae are normal. Right eye exhibits no discharge. Left eye exhibits no discharge. No scleral icterus.  Neck: Normal range of motion.  Cardiovascular: Normal rate and regular rhythm.  Respiratory: Effort normal. No respiratory distress.  Musculoskeletal:  Left shoulder, elbow, wrist, digits- no skin wounds, extensive ecchymoses, TTP elbow diffuse but mostly over olecranon, no TTP at radial head, no instability, pro/sup range WNL, ext/flex 170-80, boggy olecranon bursa, no erythema or warmth, 2+ pitting edema, pain with extension against resistance  Sens  Ax/R/M/U intact  Mot   Ax/ R/ PIN/ M/ AIN/ U intact  Rad 1+   Neurological: He is alert.  Skin: Skin is warm and dry. He is not diaphoretic.  Psychiatric: He has a normal mood and affect. His behavior is normal.    Assessment/Plan: Left elbow pain -- Despite x-ray do not feel this represents radial head fx. His motion and no pain at rest argues against a septic process. Will aspirate and inject bursa with anesthetic to see if that relieves symptoms. Will also check [place] of LUE given pitting edema not present on contralateral side. He is already on a moderate dose of prednisone so I think any oral analgesic that's effective can be used for his pain. No motion restriction or immobilization necessary. Can f/u with Dr. Tamera Punt as OP.    Lisette Abu, PA-C Orthopedic Surgery [phone] 07/28/2017, 9:38 AM

## 2017-07-28 NOTE — Consult Note (Addendum)
[facility] Nurse wound consult note Reason for Consult: heel, ankle, buttock wounds  Family reports recent stay in rehab facility with immobility which has led to pressure injuries Wound type: Unstageable pressure injury right lateral malleolus; 3.5cm x 2.0cm x 0.1cm  Healing Stage 2 pressure injury left heel: 0.2cm x 0.2cm x 0.1cm   Pressure Injury POA: Yes Measurement: see above  Wound bed: Left heel; almost healed, pink centrally  Right lateral ankle: 100% black  Drainage (amount, consistency, odor) none from the left heel; thick yellow scant drainage at the perimeter with pressure Periwound: intact  Dressing procedure/placement/frequency: Continue heel foam to the left heel, offload with pillows Add enzymatic debridement ointment to the right lateral malleolus   Was not able to evaluate buttock wound, patient was in the chair. [facility] nurse will return later in the day when patient is back in the bed to assess.  Discussed POC with patient and bedside nurse.  Re consult if needed, will not follow at this time. Thanks  Eames Dibiasio [employer] MSN, RN,CWOCN, CNS, [place] [phone])    1404 returned to patient room to assess buttock wound.  It is noted he has an area on the coccyx that is red but blanchable. With maceration centrally. Would use barrier cream or foam as long as foam is placed correctly. Discussed POC with patient and bedside nurse.  Re consult if needed, will not follow at this time. Thanks  Hovanes Hymas [employer], RN,CWOCN, CNS, [place] [phone])

## 2017-07-28 NOTE — Progress Notes (Signed)
PROGRESS NOTE    James David  RDE:[id number] DOB: August 24, 1936 DOA: 07/27/2017 PCP: [name], MD   Outpatient Specialists:     Brief Narrative:  James David is a 81 y.o. male with medical history significant of CVA in 5/18; pacemaker placement; HTN; HLD; ESRD on MWF HD; DM; afib on Coumadin; and myasthenia gravis presenting with weakness.  James David reports that the myasthenia gravis has "really put the locks on me."  James David has been unable to walk since shortly after his CVA in 5/18 (CVA 5/16, hospitalized, seemed to be getting better and was sent home but the next day James David started falling and was unable to walk and hasn't been able to since). His wife called the [facility] today because James David has been so weak the last few days - she had to call EMS to get him out of the floor.  The nurse came to see him and thought James David needed to be seen and so she callled 911 because James David is so weak.  For the last few days to week, it is hard to get him up into the wheelchair or to get him back into the bed.  Wednesday night was the first time his wife had to call EMS due to a fall in several weeks.  Since Friday, it has gotten even worse.    Hospitalizations/ER visits in the last 6 months: 5/16-6/2 - right subcortical infarction requiring rehab 6/3 - orthostatic syncope 6/6 - weakness 7/2-11 - acute respiratory failure due to MG crisis/E coli urosepsis 9/24-28 - bleeding from AV fistula with early shock     Assessment & Plan:   Principal Problem:   Weakness Active Problems:   Diabetes mellitus due to underlying condition with diabetic nephropathy (HCC)   CKD (chronic kidney disease) stage V requiring chronic dialysis (HCC)   Atrial fibrillation, permanent (HCC)   Supratherapeutic INR   Anemia   Myasthenia gravis (HCC)   Benign essential HTN   Pressure injury of skin   UTI (urinary tract infection)   Left elbow pain   Weakness -Patient with h/o myasthenia gravis fairly recently diagnosed but with  several recent exacerbations and rapid progression to non-ambulatory status  -Presented today with acute on chronic weakness -Thought to have UTI as cause by ER physician (see below) -While UTI is a possible cause, his left elbow issue could be a factor but his MG is an even greater concern (see below) -Request PT consult  UTI -Despite ESRD, patient does make urine -His UA could be consistent with UTI but is not diagnostic for this -James David had prior culture-proven UTIs in 7/18 (pansensitive E coli) and 6/18 (pan-sensitive Staph epidermidis) -Will continue treatment with Rocephin for now  Left elbow pain -James David has an effusion in the bursa and this may be a simple bursitis -Given concern for infection, will also treat for septic arthritis empirically for now (lower suspicion) with addition of Vanc to previously-added Rocephin-- continue IV as unable to toelrate -doubt gout -seen by ortho: not fracture, s/p tap-- culture pending  Myasthenia gravis -James David was unable to tolerate Mestinon due to diarrhea -James David was previously treated with IVIg x 2 (?) -James David has been maintained on 20 mg prednisone daily -His wife reports that James David is due next week for another infusion - presumably of IVIg -consideration of neurology consult if not improved in AM -NIF/VC  ESRD on MWF HD -Patient on chronic MWF HD -Nephrology to dialyize in AM  DM -A1c was 5.7 in 7/18 -  Will hold Amaryl -Cover with moderate-scale SSI  Afib on anticoagulation -Rate controlled -Subtherapeutic on Coumadin - will ask pharmacy to dose  Anemia from CKD -Slightly lower than prior -Will trend  Pressure ulcers -These do not appear to be the source of his current illness but also are in need of assistance in managing them -Wound care consult requested  HTN -Coreg dose was changed from 3.125 mg BID to 25 mg BID over a month ago; will continue increased dose (confirmed by pharmacy)      DVT prophylaxis:  Fully  anticoagulated   Code Status: Full Code   Family Communication:   Disposition Plan:     Consultants:    Subjective: Working with PT, elbow painful Still with nausea and not able to tolerate PO abx  Objective: Vitals:   07/27/17 2300 07/28/17 0603 07/28/17 0948 07/28/17 1402  BP:  121/60 121/60 (!) 147/58  Pulse:  70  97  Resp:  18  20  Temp:  98 F (36.7 C)  98.4 F (36.9 C)  TempSrc:  Oral  Oral  SpO2:  98%  100%  Weight: 80.3 kg (177 lb)       Intake/Output Summary (Last 24 hours) at 07/28/2017 1438 Last data filed at 07/28/2017 1300 Gross per 24 hour  Intake 1023.34 ml  Output 300 ml  Net 723.34 ml   Filed Weights   07/27/17 2300  Weight: 80.3 kg (177 lb)    Examination:  General exam: ill appearing Respiratory system: Clear to auscultation. Respiratory effort normal. Cardiovascular system: S1 & S2 heard, RRR. No JVD, murmurs, rubs, gallops or clicks. No pedal edema. Gastrointestinal system: Abdomen is nondistended, soft and nontender. No organomegaly or masses felt. Normal bowel sounds heard. Central nervous system: Alert and oriented. No focal neurological deficits. Extremities: weak Skin: multiple areas of bruising Psychiatry: Judgement and insight appear normal. Mood & affect appropriate.     Data Reviewed: I have personally reviewed following labs and imaging studies  CBC: Recent Labs  Lab 07/27/17 1608 07/28/17 0750  WBC 13.4* 9.9  NEUTROABS 12.2*  --   HGB 10.3* 9.9*  HCT 33.0* 30.9*  MCV 105.1* 103.0*  PLT 214 967   Basic Metabolic Panel: Recent Labs  Lab 07/27/17 1608 07/28/17 0750  NA 130* 135  K 5.5* 4.7  CL 92* 98*  CO2 25 24  GLUCOSE 406* 58*  BUN 69* 77*  CREATININE 8.57* 9.05*  CALCIUM 9.8 9.3   GFR: Estimated Creatinine Clearance: 6.8 mL/min (A) (by C-G formula based on SCr of 9.05 mg/dL (H)). Liver Function Tests: Recent Labs  Lab 07/27/17 1608  AST 20  ALT 22  ALKPHOS 45  BILITOT 0.6  PROT 5.8*    ALBUMIN 2.9*   No results for input(s): LIPASE, AMYLASE in the last 168 hours. No results for input(s): AMMONIA in the last 168 hours. Coagulation Profile: Recent Labs  Lab 07/27/17 1608 07/28/17 0750  INR 1.87 2.21   Cardiac Enzymes: Recent Labs  Lab 07/27/17 1925  TROPONINI <0.03   BNP (last 3 results) No results for input(s): PROBNP in the last 8760 hours. HbA1C: No results for input(s): HGBA1C in the last 72 hours. CBG: Recent Labs  Lab 07/27/17 2307 07/28/17 0932 07/28/17 1019 07/28/17 1232  GLUCAP 206* 52* 132* 196*   Lipid Profile: No results for input(s): CHOL, HDL, LDLCALC, TRIG, CHOLHDL, LDLDIRECT in the last 72 hours. Thyroid Function Tests: No results for input(s): TSH, T4TOTAL, FREET4, T3FREE, THYROIDAB in the last 72  hours. Anemia Panel: No results for input(s): VITAMINB12, FOLATE, FERRITIN, TIBC, IRON, RETICCTPCT in the last 72 hours. Urine analysis:    Component Value Date/Time   COLORURINE YELLOW 07/27/2017 1609   COLORURINE AMBER (A) 07/27/2017 1609   APPEARANCEUR TURBID (A) 07/27/2017 1609   APPEARANCEUR TURBID (A) 07/27/2017 1609   LABSPEC 1.010 07/27/2017 1609   LABSPEC 1.010 07/27/2017 1609   PHURINE 8.0 07/27/2017 1609   PHURINE 8.0 07/27/2017 1609   GLUCOSEU >=500 (A) 07/27/2017 1609   GLUCOSEU >=500 (A) 07/27/2017 1609   HGBUR SMALL (A) 07/27/2017 1609   HGBUR SMALL (A) 07/27/2017 1609   BILIRUBINUR NEGATIVE 07/27/2017 1609   BILIRUBINUR NEGATIVE 07/27/2017 1609   KETONESUR NEGATIVE 07/27/2017 1609   [place] 07/27/2017 1609   PROTEINUR 100 (A) 07/27/2017 1609   PROTEINUR 100 (A) 07/27/2017 1609   NITRITE NEGATIVE 07/27/2017 1609   NITRITE NEGATIVE 07/27/2017 1609   LEUKOCYTESUR LARGE (A) 07/27/2017 1609   LEUKOCYTESUR LARGE (A) 07/27/2017 1609     ) Recent Results (from the past 240 hour(s))  Urine Culture     Status: Abnormal (Preliminary result)   Collection Time: 07/27/17  4:35 PM  Result Value Ref Range  Status   Specimen Description URINE, CLEAN CATCH  Final   Special Requests NONE  Final   Culture >=100,000 COLONIES/mL GRAM NEGATIVE RODS (A)  Final   Report Status PENDING  Incomplete  MRSA PCR Screening     Status: None   Collection Time: 07/28/17 12:19 AM  Result Value Ref Range Status   MRSA by PCR NEGATIVE NEGATIVE Final    Comment:        The GeneXpert MRSA Assay (FDA approved for NASAL specimens only), is one component of a comprehensive MRSA colonization surveillance program. It is not intended to diagnose MRSA infection nor to guide or monitor treatment for MRSA infections.   Body fluid culture     Status: None (Preliminary result)   Collection Time: 07/28/17 10:58 AM  Result Value Ref Range Status   Specimen Description SYNOVIAL  Final   Special Requests NONE  Final   Gram Stain   Final    ABUNDANT WBC PRESENT, PREDOMINANTLY PMN RARE GRAM POSITIVE COCCI IN CLUSTERS    Culture PENDING  Incomplete   Report Status PENDING  Incomplete      Anti-infectives (From admission, onward)   Start     Dose/Rate Route Frequency Ordered Stop   07/28/17 1800  cefTRIAXone (ROCEPHIN) 1 g in dextrose 5 % 50 mL IVPB     1 g 100 mL/hr over 30 Minutes Intravenous Every 24 hours 07/27/17 2349     07/28/17 1200  vancomycin (VANCOCIN) IVPB 750 mg/150 ml premix     750 mg 150 mL/hr over 60 Minutes Intravenous Every M-W-F (Hemodialysis) 07/27/17 2352     07/28/17 0030  vancomycin (VANCOCIN) 500 mg in sodium chloride 0.9 % 100 mL IVPB     500 mg 100 mL/hr over 60 Minutes Intravenous  Once 07/27/17 2352 07/28/17 0140   07/27/17 2300  piperacillin-tazobactam (ZOSYN) IVPB 3.375 g  Status:  Discontinued     3.375 g 12.5 mL/hr over 240 Minutes Intravenous Every 12 hours 07/27/17 2202 07/27/17 2251   07/27/17 2300  vancomycin (VANCOCIN) IVPB 1000 mg/200 mL premix     1,000 mg 200 mL/hr over 60 Minutes Intravenous  Once 07/27/17 2202 07/28/17 0027   07/27/17 1830  cefTRIAXone (ROCEPHIN) 1 g  in dextrose 5 % 50 mL IVPB  1 g 100 mL/hr over 30 Minutes Intravenous  Once 07/27/17 1817 07/27/17 1853       Radiology Studies: Dg Chest 2 View  Result Date: 07/27/2017 CLINICAL DATA:  81 y/o  M; weakness. EXAM: CHEST  2 VIEW COMPARISON:  03/21/2017 CT head FINDINGS: Stable cardiomegaly. Aortic atherosclerosis with calcification. Clear lungs. No pleural effusion or pneumothorax. No acute osseous abnormality is evident. Two lead pacemaker noted. Surgical clips project over left axilla. Right axillary vascular stent. IMPRESSION: Stable cardiomegaly.  No acute pulmonary process identified. Electronically Signed   By: Kristine Garbe M.D.   On: 07/27/2017 17:37   Dg Elbow Complete Left  Result Date: 07/27/2017 CLINICAL DATA:  81 y/o  M; elbow pain. EXAM: LEFT ELBOW - COMPLETE 3+ VIEW COMPARISON:  None. FINDINGS: Lucency in radial head on a single oblique view and elevated anterior fat pad, question radial head fracture. Calcification adjacent to the lateral epicondyles compatible with lateral epicondylitis. Vascular calcifications. No joint dislocation. IMPRESSION: Lucency in radial head on single view and elevated anterior fat pad, question radial head fracture. Electronically Signed   By: Kristine Garbe M.D.   On: 07/27/2017 17:26        Scheduled Meds: . aspirin EC  81 mg Oral QHS  . bupivacaine  10 mL Infiltration Once  . carvedilol  25 mg Oral BID WC  . collagenase   Topical Daily  . [START ON 08/01/2017] darbepoetin (ARANESP) injection - DIALYSIS  150 mcg Intravenous Q Fri-HD  . docusate sodium  100 mg Oral BID  . doxercalciferol  4 mcg Intravenous Q M,W,F-HD  . ezetimibe  10 mg Oral Q supper  . famotidine  20 mg Oral QHS  . feeding supplement (NEPRO CARB STEADY)  237 mL Oral BID BM  . fenofibrate  160 mg Oral Daily  . finasteride  5 mg Oral Daily  . gabapentin  100 mg Oral QHS  . insulin aspart  0-15 Units Subcutaneous TID WC  . insulin aspart  0-5  Units Subcutaneous QHS  . ipratropium  2 spray Each Nare Daily  . lidocaine  5 mL Other Once  . multivitamin  1 tablet Oral QHS  . predniSONE  20 mg Oral Q breakfast  . warfarin  1 mg Oral ONCE-1800  . Warfarin - Pharmacist Dosing Inpatient   Does not apply q1800   Continuous Infusions: . sodium chloride 50 mL/hr at 07/28/17 0953  . cefTRIAXone (ROCEPHIN)  IV    . ferric gluconate (FERRLECIT/NULECIT) IV 125 mg (07/28/17 1349)  . [START ON 07/30/2017] ferric gluconate (FERRLECIT/NULECIT) IV    . vancomycin       LOS: 0 days    Time spent: 35 min    [place], DO [facility] Pager [phone]  If 7PM-7AM, please contact night-coverage [email or web address] Password [facility] 07/28/2017, 2:38 PM

## 2017-07-28 NOTE — Evaluation (Signed)
Physical Therapy Evaluation Patient Details Name: James David MRN: [id number] DOB: 1936/06/19 Today's Date: 07/28/2017   History of Present Illness  James David a 81 y.o.malewith medical history significant ofCVA in 5/18; pacemaker placement; HTN; HLD; ESRD on MWF HD; DM; afib on Coumadin; and myasthenia gravis presenting with weakness. He reports that the myasthenia gravis has "really put the locks on me." Pt with 5 hospital admissions since 01/2017.   Clinical Impression  Pt with L elbow pain limiting functional use. Pt was at home with assist of spouse but unsure if spouse can con't to care for patient safely as patient has declined functionally and has had recent falls.Awaiting for ortho consult on L elbow. If patient becomes NWB on L UE pt may need to go to ST-SNF to gain strength as pt currently maxA for all mobitliy. Wife can perform minA level of care. PT to con't to follow and reassess d/c recommendation as appropriate.    Follow Up Recommendations Home health PT;Supervision/Assistance - 24 hour    Equipment Recommendations  None recommended by PT    Recommendations for Other Services       Precautions / Restrictions Precautions Precautions: Fall Precaution Comments: L lateral lean, L painful elbow - awaiting ortho consult Restrictions Weight Bearing Restrictions: No      Mobility  Bed Mobility Overal bed mobility: Needs Assistance Bed Mobility: Supine to Sit     Supine to sit: HOB elevated;Max assist     General bed mobility comments: with max directional v/c's pt able to bring LEs off EOB, maxA for trunk elevation, pt able to use R UEs to scoot hips to EOB and sit square, pt wiht noted L lateral lean  Transfers Overall transfer level: Needs assistance Equipment used: (1 person assist with gait belt) Transfers: Stand Pivot Transfers   Stand pivot transfers: Max assist       General transfer comment: pt unable to achieve full upright standing. pt  unable to take steps. Completed 2 std pvt transfers to get into chair. pt able to scoot self back in chair via pushing with LEs and R UE, unable to use L UE to assist with transfers due to onset of pain with WBing  Ambulation/Gait             General Gait Details: pt non-ambulatory  Stairs            Wheelchair Mobility    Modified Rankin (Stroke Patients Only) Modified Rankin (Stroke Patients Only) Pre-Morbid Rankin Score: Severe disability Modified Rankin: Severe disability     Balance Overall balance assessment: Needs assistance Sitting-balance support: Feet supported;Single extremity supported Sitting balance-Leahy Scale: Poor Sitting balance - Comments: strong L lateral lean but unable to support self with L UE due to pain, requires modA to maintain balance   Standing balance support: Single extremity supported Standing balance-Leahy Scale: Zero Standing balance comment: unable to achieve full standing, dependent on physical assist                             Pertinent Vitals/Pain Pain Assessment: 0-10 Pain Score: 8  Pain Location: L elbow Pain Intervention(s): Monitored during session    Home Living Family/patient expects to be discharged to:: Unsure                 Additional Comments: pt desires to return home with spouse however pt very weak compared to a couple of weeks ago. Pt  with one level home with ramped entry. has a tub bench in which he can transfer to with minimal assist. Pt with elevated commode. Wife available 24/7 and has been primary caregiver.     Prior Function Level of Independence: Needs assistance   Gait / Transfers Assistance Needed: pt non-ambulatory since CVA in 01/2017. working with PT 2x/wk. Pt mod/maxA for transfer to/from w/c currently but was minA with wife before that  ADL's / Homemaking Assistance Needed: wife assist with bathing and dressing, pt can feed self when set up        Hand Dominance    Dominant Hand: Right    Extremity/Trunk Assessment   Upper Extremity Assessment Upper Extremity Assessment: RUE deficits/detail;LUE deficits/detail RUE Deficits / Details: generalized weakness, grossly 3+/5 LUE Deficits / Details: painful supination, grossly 3-/5, inability to push up while at EOB due to pain    Lower Extremity Assessment Lower Extremity Assessment: Generalized weakness(R weaker than L. Pt able to complete quad sets, )    Cervical / Trunk Assessment Cervical / Trunk Assessment: Kyphotic  Communication   Communication: No difficulties  Cognition Arousal/Alertness: Awake/alert Behavior During Therapy: WFL for tasks assessed/performed Overall Cognitive Status: Within Functional Limits for tasks assessed                                        General Comments General comments (skin integrity, edema, etc.): pt wiht noted R elbow abrasions, R heel wounds    Exercises General Exercises - Lower Extremity Ankle Circles/Pumps: AROM;Both;10 reps;Supine Quad Sets: AROM;Both;10 reps;Supine Gluteal Sets: AROM;Both;10 reps;Supine   Assessment/Plan    PT Assessment Patient needs continued PT services  PT Problem List Decreased strength;Decreased range of motion;Decreased activity tolerance;Decreased balance;Decreased mobility;Decreased coordination;Decreased knowledge of use of DME;Pain       PT Treatment Interventions DME instruction;Gait training;Stair training;Functional mobility training;Therapeutic activities;Therapeutic exercise;Balance training;Neuromuscular re-education    PT Goals (Current goals can be found in the Care Plan section)  Acute Rehab PT Goals Patient Stated Goal: go home PT Goal Formulation: With patient Time For Goal Achievement: 08/11/17 Potential to Achieve Goals: Fair    Frequency Min 3X/week   Barriers to discharge        Co-evaluation               AM-PAC PT "6 Clicks" Daily Activity  Outcome Measure  Difficulty turning over in bed (including adjusting bedclothes, sheets and blankets)?: Unable Difficulty moving from lying on back to sitting on the side of the bed? : Unable Difficulty sitting down on and standing up from a chair with arms (e.g., wheelchair, bedside commode, etc,.)?: Unable Help needed moving to and from a bed to chair (including a wheelchair)?: Total Help needed walking in hospital room?: Total Help needed climbing 3-5 steps with a railing? : Total 6 Click Score: 6    End of Session Equipment Utilized During Treatment: Gait belt Activity Tolerance: Patient tolerated treatment well Patient left: in chair;with call bell/phone within reach;with chair alarm set Nurse Communication: Mobility status PT Visit Diagnosis: Unsteadiness on feet (R26.81);Repeated falls (R29.6);Muscle weakness (generalized) (M62.81);Difficulty in walking, not elsewhere classified (R26.2)    Time: 3536-1443 PT Time Calculation (min) (ACUTE ONLY): 30 min   Charges:   PT Evaluation $PT Eval Moderate Complexity: 1 Mod PT Treatments $Therapeutic Activity: 8-22 mins   PT G Codes:   PT G-Codes **NOT FOR INPATIENT CLASS** Functional Assessment  Tool Used: Clinical judgement Functional Limitation: Mobility: Walking and moving around Mobility: Walking and Moving Around Current Status [phone]): At least 40 percent but less than 60 percent impaired, limited or restricted Mobility: Walking and Moving Around Goal Status [phone]): At least 1 percent but less than 20 percent impaired, limited or restricted    Kittie Plater, PT, DPT Pager #: [phone] Office #: [phone]   [place] 07/28/2017, 8:54 AM

## 2017-07-28 NOTE — Progress Notes (Signed)
[place] for Coumadin Indication: atrial fibrillation  Allergies  Allergen Reactions  . Amlodipine Besylate Swelling    Angioedema   . Lisinopril Other (See Comments)    Renal insufficiency  . Statins Other (See Comments)    Muscle aches  . Ropinirole Other (See Comments)    Causes excessive grogginess the following morning after taking  . Tape Other (See Comments)    SKIN IS THIN AND TAPE WILL ACTUALLY TEAR IT; PLEASE USE COBAN WRAP INSTEAD!!    Patient Measurements: Weight: 177 lb (80.3 kg)  Vital Signs: Temp: 98 F (36.7 C) (11/12 0603) Temp Source: Oral (11/12 0603) BP: 121/60 (11/12 0948) Pulse Rate: 70 (11/12 0603)  Labs: Recent Labs    07/27/17 1608 07/27/17 1925 07/28/17 0750  HGB 10.3*  --  9.9*  HCT 33.0*  --  30.9*  PLT 214  --  150  LABPROT 21.3*  --  24.3*  INR 1.87  --  2.21  CREATININE 8.57*  --  9.05*  TROPONINI  --  <0.03  --     Estimated Creatinine Clearance: 6.8 mL/min (A) (by C-G formula based on SCr of 9.05 mg/dL (H)).   Medical History: Past Medical History:  Diagnosis Date  . Atrial fibrillation ([place])    on Coumadin  . DDD (degenerative disc disease)   . Diabetes mellitus without complication ([place])   . ESRD (end stage renal disease) on dialysis ([place])   . Hyperlipidemia   . Hypertension   . Melanoma ([place]) 2010   removed at [facility]  . Second degree heart block    a. s/p STJ dual chamber PPM - Dr [name]  . Stroke [facility]) 01/2017   Assessment: 81 y.o. male admitted with weakness/UTI, h/o Afib, to continue home Coumadin. INR therapeutic and CBC stable.   Goal of Therapy:  INR 2-3 Monitor platelets by anticoagulation protocol: Yes   Plan:  1. Coumadin 1 mg tonight 2. Daily INR   Vincenza Hews, PharmD, BCPS 07/28/2017, 10:13 AM

## 2017-07-28 NOTE — Progress Notes (Signed)
Inpatient Diabetes Program Recommendations  AACE/ADA: New Consensus Statement on Inpatient Glycemic Control (2015)  Target Ranges:  Prepandial:   less than 140 mg/dL      Peak postprandial:   less than 180 mg/dL (1-2 hours)      Critically ill patients:  140 - 180 mg/dL   Lab Results  Component Value Date   GLUCAP 132 (H) 07/28/2017   HGBA1C 5.7 (H) 03/20/2017    Review of Glycemic Control  Results for CONNERY, SHIFFLER (MRN [id number]) as of 07/28/2017 10:22  Ref. Range 07/27/2017 23:07 07/28/2017 09:32 07/28/2017 10:19  Glucose-Capillary Latest Ref Range: 65 - 99 mg/dL 206 (H) 52 (L) 132 (H)    Diabetes history: Type 2 Outpatient Diabetes medications: none Current orders for Inpatient glycemic control: Novolog 0-15 units tid, Novolog 0-5 units qhs  Inpatient Diabetes Program Recommendations: Please consider decreasing Novolog correction to sensitive correction 0-9 units tid.   Gentry Fitz, RN, BA, MHA, CDE Diabetes Coordinator Inpatient Diabetes Program  [phone] (Team Pager) [phone] ([place]) 07/28/2017 10:27 AM

## 2017-07-28 NOTE — Progress Notes (Signed)
Hypoglycemic Event  CBG: 53  Treatment: D50 IV 25 mL  Symptoms: None  Follow-up CBG: Time: 10:19 CBG Result:132  Possible Reasons for Event: Inadequate meal intake   James David

## 2017-07-29 ENCOUNTER — Observation Stay (HOSPITAL_BASED_OUTPATIENT_CLINIC_OR_DEPARTMENT_OTHER): Payer: Medicare Other

## 2017-07-29 DIAGNOSIS — M62551 Muscle wasting and atrophy, not elsewhere classified, right thigh: Secondary | ICD-10-CM | POA: Diagnosis present

## 2017-07-29 DIAGNOSIS — I10 Essential (primary) hypertension: Secondary | ICD-10-CM | POA: Diagnosis not present

## 2017-07-29 DIAGNOSIS — R609 Edema, unspecified: Secondary | ICD-10-CM

## 2017-07-29 DIAGNOSIS — M79609 Pain in unspecified limb: Secondary | ICD-10-CM | POA: Diagnosis not present

## 2017-07-29 DIAGNOSIS — Z992 Dependence on renal dialysis: Secondary | ICD-10-CM

## 2017-07-29 DIAGNOSIS — N390 Urinary tract infection, site not specified: Secondary | ICD-10-CM | POA: Diagnosis not present

## 2017-07-29 DIAGNOSIS — M62552 Muscle wasting and atrophy, not elsewhere classified, left thigh: Secondary | ICD-10-CM | POA: Diagnosis present

## 2017-07-29 DIAGNOSIS — E0821 Diabetes mellitus due to underlying condition with diabetic nephropathy: Secondary | ICD-10-CM | POA: Diagnosis not present

## 2017-07-29 DIAGNOSIS — N186 End stage renal disease: Secondary | ICD-10-CM | POA: Diagnosis present

## 2017-07-29 DIAGNOSIS — R531 Weakness: Secondary | ICD-10-CM | POA: Diagnosis not present

## 2017-07-29 DIAGNOSIS — E1122 Type 2 diabetes mellitus with diabetic chronic kidney disease: Secondary | ICD-10-CM | POA: Diagnosis present

## 2017-07-29 DIAGNOSIS — L89622 Pressure ulcer of left heel, stage 2: Secondary | ICD-10-CM | POA: Diagnosis present

## 2017-07-29 DIAGNOSIS — G7000 Myasthenia gravis without (acute) exacerbation: Principal | ICD-10-CM

## 2017-07-29 DIAGNOSIS — N2581 Secondary hyperparathyroidism of renal origin: Secondary | ICD-10-CM | POA: Diagnosis present

## 2017-07-29 DIAGNOSIS — Z950 Presence of cardiac pacemaker: Secondary | ICD-10-CM | POA: Diagnosis not present

## 2017-07-29 DIAGNOSIS — N3000 Acute cystitis without hematuria: Secondary | ICD-10-CM

## 2017-07-29 DIAGNOSIS — Z7901 Long term (current) use of anticoagulants: Secondary | ICD-10-CM | POA: Diagnosis not present

## 2017-07-29 DIAGNOSIS — I482 Chronic atrial fibrillation: Secondary | ICD-10-CM

## 2017-07-29 DIAGNOSIS — M62561 Muscle wasting and atrophy, not elsewhere classified, right lower leg: Secondary | ICD-10-CM | POA: Diagnosis present

## 2017-07-29 DIAGNOSIS — E1165 Type 2 diabetes mellitus with hyperglycemia: Secondary | ICD-10-CM | POA: Diagnosis present

## 2017-07-29 DIAGNOSIS — L89510 Pressure ulcer of right ankle, unstageable: Secondary | ICD-10-CM | POA: Diagnosis present

## 2017-07-29 DIAGNOSIS — M71122 Other infective bursitis, left elbow: Secondary | ICD-10-CM | POA: Diagnosis present

## 2017-07-29 DIAGNOSIS — M25522 Pain in left elbow: Secondary | ICD-10-CM

## 2017-07-29 DIAGNOSIS — Z8249 Family history of ischemic heart disease and other diseases of the circulatory system: Secondary | ICD-10-CM | POA: Diagnosis not present

## 2017-07-29 DIAGNOSIS — B965 Pseudomonas (aeruginosa) (mallei) (pseudomallei) as the cause of diseases classified elsewhere: Secondary | ICD-10-CM | POA: Diagnosis present

## 2017-07-29 DIAGNOSIS — D631 Anemia in chronic kidney disease: Secondary | ICD-10-CM | POA: Diagnosis present

## 2017-07-29 DIAGNOSIS — I120 Hypertensive chronic kidney disease with stage 5 chronic kidney disease or end stage renal disease: Secondary | ICD-10-CM | POA: Diagnosis present

## 2017-07-29 DIAGNOSIS — Z833 Family history of diabetes mellitus: Secondary | ICD-10-CM | POA: Diagnosis not present

## 2017-07-29 DIAGNOSIS — E1121 Type 2 diabetes mellitus with diabetic nephropathy: Secondary | ICD-10-CM | POA: Diagnosis present

## 2017-07-29 DIAGNOSIS — M62562 Muscle wasting and atrophy, not elsewhere classified, left lower leg: Secondary | ICD-10-CM | POA: Diagnosis present

## 2017-07-29 DIAGNOSIS — Z8673 Personal history of transient ischemic attack (TIA), and cerebral infarction without residual deficits: Secondary | ICD-10-CM | POA: Diagnosis not present

## 2017-07-29 LAB — CBC
HCT: 30.3 % — ABNORMAL LOW (ref 39.0–52.0)
Hemoglobin: 9.5 g/dL — ABNORMAL LOW (ref 13.0–17.0)
MCH: 32.6 pg (ref 26.0–34.0)
MCHC: 31.4 g/dL (ref 30.0–36.0)
MCV: 104.1 fL — ABNORMAL HIGH (ref 78.0–100.0)
PLATELETS: 197 10*3/uL (ref 150–400)
RBC: 2.91 MIL/uL — AB (ref 4.22–5.81)
RDW: 20.8 % — ABNORMAL HIGH (ref 11.5–15.5)
WBC: 8.2 10*3/uL (ref 4.0–10.5)

## 2017-07-29 LAB — BASIC METABOLIC PANEL
ANION GAP: 9 (ref 5–15)
BUN: 30 mg/dL — ABNORMAL HIGH (ref 6–20)
CALCIUM: 8.2 mg/dL — AB (ref 8.9–10.3)
CO2: 27 mmol/L (ref 22–32)
Chloride: 99 mmol/L — ABNORMAL LOW (ref 101–111)
Creatinine, Ser: 4.71 mg/dL — ABNORMAL HIGH (ref 0.61–1.24)
GFR calc Af Amer: 12 mL/min — ABNORMAL LOW (ref >60)
GFR, EST NON AFRICAN AMERICAN: 11 mL/min — AB (ref >60)
Glucose, Bld: 119 mg/dL — ABNORMAL HIGH (ref 65–99)
POTASSIUM: 3.6 mmol/L (ref 3.5–5.1)
SODIUM: 135 mmol/L (ref 135–145)

## 2017-07-29 LAB — PROTIME-INR
INR: 2.22
PROTHROMBIN TIME: 24.4 s — AB (ref 11.4–15.2)

## 2017-07-29 LAB — GLUCOSE, CAPILLARY
GLUCOSE-CAPILLARY: 100 mg/dL — AB (ref 65–99)
Glucose-Capillary: 265 mg/dL — ABNORMAL HIGH (ref 65–99)
Glucose-Capillary: 220 mg/dL — ABNORMAL HIGH (ref 65–99)
Glucose-Capillary: 240 mg/dL — ABNORMAL HIGH (ref 65–99)

## 2017-07-29 MED ORDER — WARFARIN SODIUM 1 MG PO TABS
1.5000 mg | ORAL_TABLET | Freq: Once | ORAL | Status: AC
  Administered 2017-07-29: 18:00:00 1.5 mg via ORAL
  Filled 2017-07-29: qty 1

## 2017-07-29 MED ORDER — DEXTROSE 5 % IV SOLN: 2.0000 g | INTRAVENOUS | Status: DC

## 2017-07-29 MED ORDER — CEFEPIME HCL 1 G IJ SOLR
1.0000 g | Freq: Once | INTRAMUSCULAR | Status: AC
  Administered 2017-07-29: 1 g via INTRAVENOUS
  Filled 2017-07-29: qty 1

## 2017-07-29 MED ORDER — IMMUNE GLOBULIN (HUMAN) 20 GM/200ML IV SOLN
1.0000 g/kg | Freq: Once | INTRAVENOUS | Status: AC
  Administered 2017-07-29: 80 g via INTRAVENOUS
  Filled 2017-07-29: qty 800

## 2017-07-29 NOTE — Progress Notes (Signed)
LUE venous duplex prelim: negative for DVT. SVT noted in the left cephalic at IV site (AC fossa) and mid upper arm. Landry Mellow, RDMS, RVT

## 2017-07-29 NOTE — Progress Notes (Signed)
NIF (-40) VC 8.4L. PT had strong effort.

## 2017-07-29 NOTE — Progress Notes (Signed)
[place] for Coumadin Indication: atrial fibrillation  Allergies  Allergen Reactions  . Amlodipine Besylate Swelling    Angioedema   . Lisinopril Other (See Comments)    Renal insufficiency  . Statins Other (See Comments)    Muscle aches  . Ropinirole Other (See Comments)    Causes excessive grogginess the following morning after taking  . Tape Other (See Comments)    SKIN IS THIN AND TAPE WILL ACTUALLY TEAR IT; PLEASE USE COBAN WRAP INSTEAD!!    Patient Measurements: Height: 5\' 10"  (177.8 cm) Weight: 172 lb 13.5 oz (78.4 kg) IBW/kg (Calculated) : 73  Vital Signs: Temp: 98.7 F (37.1 C) (11/13 0535) Temp Source: Oral (11/13 0535) BP: 140/60 (11/13 0927) Pulse Rate: 84 (11/13 0927)  Labs: Recent Labs    07/27/17 1608 07/27/17 1925 07/28/17 0750 07/29/17 0427  HGB 10.3*  --  9.9* 9.5*  HCT 33.0*  --  30.9* 30.3*  PLT 214  --  150 197  LABPROT 21.3*  --  24.3* 24.4*  INR 1.87  --  2.21 2.22  CREATININE 8.57*  --  9.05* 4.71*  TROPONINI  --  <0.03  --   --     Estimated Creatinine Clearance: 13.1 mL/min (A) (by C-G formula based on SCr of 4.71 mg/dL (H)).   Medical History: Past Medical History:  Diagnosis Date  . Anemia 07/28/2017  . Atrial fibrillation ([place])    on Coumadin  . DDD (degenerative disc disease)   . Diabetes mellitus without complication ([place])   . ESRD (end stage renal disease) on dialysis ([place])   . Hyperlipidemia   . Hypertension   . Melanoma ([place]) 2010   removed at [facility]  . Second degree heart block    a. s/p STJ dual chamber PPM - Dr [name]  . Stroke [facility]) 01/2017   Assessment: 81 y.o. male admitted with weakness/UTI, h/o Afib, to continue home Coumadin. INR therapeutic and CBC stable.   Goal of Therapy:  INR 2-3 Monitor platelets by anticoagulation protocol: Yes   Plan:  1. Coumadin 1.5 mg tonight as per home dose  2. Daily INR   Vincenza Hews, PharmD, BCPS 07/29/2017, 10:06 AM

## 2017-07-29 NOTE — Progress Notes (Signed)
New order NIF to assess inspiratory strength will begin in the morning during schedule RC rounds.

## 2017-07-29 NOTE — Progress Notes (Signed)
Pharmacy Antibiotic Note James David is a 81 y.o. male admitted on 07/27/2017 with concern for UTI and L elbow septic bursitis. Urine culture has grown presumptive pseudomonas and L elbow cultures are significant for presumptive staph aureus. Pt is currently on ceftriaxone and vancomycin.  Plan: 1. Change ceftriaxone to cefepime for coverage of UTI and staph aureus if turns out to be MSSA 2. Cefepime 1 gram IV x 1 now and then transition to cefepime 2 grams IV with IHD on MWF 3. Continue vancomycin 750 mg IV with IHD on MWF 4. Await final culture data and narrow abx as feasible    Height: 5\' 10"  (177.8 cm) Weight: 172 lb 13.5 oz (78.4 kg) IBW/kg (Calculated) : 73  Temp (24hrs), Avg:98.3 F (36.8 C), Min:97.6 F (36.4 C), Max:98.7 F (37.1 C)  Recent Labs  Lab 07/27/17 1608 07/28/17 0750 07/29/17 0427  WBC 13.4* 9.9 8.2  CREATININE 8.57* 9.05* 4.71*    Estimated Creatinine Clearance: 13.1 mL/min (A) (by C-G formula based on SCr of 4.71 mg/dL (H)).    Allergies  Allergen Reactions  . Amlodipine Besylate Swelling    Angioedema   . Lisinopril Other (See Comments)    Renal insufficiency  . Statins Other (See Comments)    Muscle aches  . Ropinirole Other (See Comments)    Causes excessive grogginess the following morning after taking  . Tape Other (See Comments)    SKIN IS THIN AND TAPE WILL ACTUALLY TEAR IT; PLEASE USE COBAN WRAP INSTEAD!!    Antimicrobials this admission: 11/11 ceftriaxone >>11/13  11/13 Cefepime >>   11/11 vancomycin >>   Microbiology results: 11/11 UCx: presumptive pseudomonas  11/12 MRSA PCR: negative  11/12 synovial fluid: few staph aureus    Thank you for allowing pharmacy to be a part of this patient's care.  Vincenza Hews, PharmD, BCPS 07/29/2017, 10:42 AM

## 2017-07-29 NOTE — Progress Notes (Signed)
Physical Therapy Treatment Patient Details Name: James David MRN: [id number] DOB: 19-Mar-1936 Today's Date: 07/29/2017    History of Present Illness JOSHAWA DUBIN a 81 y.o.malewith medical history significant ofCVA in 5/18; pacemaker placement; HTN; HLD; ESRD on MWF HD; DM; afib on Coumadin; and myasthenia gravis presenting with weakness. He reports that the myasthenia gravis has "really put the locks on me." Pt with 5 hospital admissions since 01/2017.     PT Comments    On arrival to pt's room, pt reported he had a BM in bed. NT arrived to assist with peri care and transfers. Pt is showing progress, requiring less assist from previous sessions, however continues to be limited by weakness and pain. He required Mod A for all therapeutic activities, and +2 for safety with transfers. Spoke with pt's wife over the phone to discuss the possibility of SNF for rehab over [place]. Wife agrees that pt is requiring more assist than she can provide at this time and is agreeable to SNF. Updated d/c recommendations and discussed with evaluating PT.     Follow Up Recommendations  SNF     Equipment Recommendations  None recommended by PT    Recommendations for Other Services       Precautions / Restrictions Precautions Precautions: Fall Precaution Comments: L lateral lean, L painful elbow Restrictions Weight Bearing Restrictions: No    Mobility  Bed Mobility Overal bed mobility: Needs Assistance Bed Mobility: Rolling;Sidelying to Sit Rolling: Mod assist Sidelying to sit: Mod assist;HOB elevated       General bed mobility comments: Mod A to roll for peri care. Mod A to elevate trunk from sidelaying to sit, to scoot hips forward, and for LE management.   Transfers Overall transfer level: Needs assistance Equipment used: Rolling walker (2 wheeled) Transfers: Stand Pivot Transfers;Sit to/from Stand Sit to Stand: +2 physical assistance;Mod assist Stand pivot transfers: +2 physical  assistance;Min assist       General transfer comment: Pt requiring mod A +2 to power up into standing and for controlled descent into chair. Min A for stand pivot transfer.  Ambulation/Gait             General Gait Details: pt non-ambulatory   Stairs            Wheelchair Mobility    Modified Rankin (Stroke Patients Only) Modified Rankin (Stroke Patients Only) Pre-Morbid Rankin Score: Severe disability Modified Rankin: Severe disability     Balance Overall balance assessment: Needs assistance Sitting-balance support: Feet supported;Single extremity supported Sitting balance-Leahy Scale: Poor Sitting balance - Comments: strong L lateral lean but unable to support self with L UE due to pain, requires min A to maintain balance   Standing balance support: Single extremity supported Standing balance-Leahy Scale: Poor Standing balance comment: pt reliant on UE support and physical assist                            Cognition Arousal/Alertness: Awake/alert Behavior During Therapy: WFL for tasks assessed/performed Overall Cognitive Status: Within Functional Limits for tasks assessed                                        Exercises      General Comments        Pertinent Vitals/Pain Pain Assessment: Faces Faces Pain Scale: Hurts little more Pain Location: L elbow  Pain Descriptors / Indicators: Discomfort;Grimacing Pain Intervention(s): Monitored during session;Limited activity within patient's tolerance;Repositioned    Home Living                      Prior Function            PT Goals (current goals can now be found in the care plan section) Acute Rehab PT Goals Patient Stated Goal: go home PT Goal Formulation: With patient Time For Goal Achievement: 08/11/17 Potential to Achieve Goals: Fair Progress towards PT goals: Progressing toward goals    Frequency    Min 3X/week      PT Plan Discharge plan needs  to be updated    Co-evaluation              AM-PAC PT "6 Clicks" Daily Activity  Outcome Measure  Difficulty turning over in bed (including adjusting bedclothes, sheets and blankets)?: Unable Difficulty moving from lying on back to sitting on the side of the bed? : Unable Difficulty sitting down on and standing up from a chair with arms (e.g., wheelchair, bedside commode, etc,.)?: Unable Help needed moving to and from a bed to chair (including a wheelchair)?: A Lot Help needed walking in hospital room?: Total Help needed climbing 3-5 steps with a railing? : Total 6 Click Score: 7    End of Session Equipment Utilized During Treatment: Gait belt Activity Tolerance: Patient tolerated treatment well Patient left: in chair;with call bell/phone within reach Nurse Communication: Mobility status PT Visit Diagnosis: Unsteadiness on feet (R26.81);Repeated falls (R29.6);Muscle weakness (generalized) (M62.81);Difficulty in walking, not elsewhere classified (R26.2)     Time: 6962-9528 PT Time Calculation (min) (ACUTE ONLY): 22 min  Charges:  $Therapeutic Activity: 8-22 mins                    G Codes:      Benjiman Core, [place] Pager [phone] Acute Rehab   Allena Katz 07/29/2017, 3:14 PM

## 2017-07-29 NOTE — Progress Notes (Signed)
PROGRESS NOTE    James David  DXI:[id number] DOB: [birth year] DOA: 07/27/2017 PCP: [name], MD   Outpatient Specialists:     Brief Narrative:  James David is a 81 y.o. male with medical history significant of CVA in 5/18; pacemaker placement; HTN; HLD; ESRD on MWF HD; DM; afib on Coumadin; and myasthenia gravis presenting with weakness.  He reports that the myasthenia gravis has "really put the locks on me."  He has been unable to walk since shortly after his CVA in 5/18 (CVA 5/16, hospitalized, seemed to be getting better and was sent home but the next day he started falling and was unable to walk and hasn't been able to since). His wife called the [facility] today because he has been so weak the last few days - she had to call EMS to get him out of the floor.  The nurse came to see him and thought he needed to be seen and so she callled 911 because he is so weak.  For the last few days to week, it is hard to get him up into the wheelchair or to get him back into the bed.  Wednesday night was the first time his wife had to call EMS due to a fall in several weeks.  Since Friday, it has gotten even worse.    Hospitalizations/ER visits in the last 6 months: 5/16-6/2 - right subcortical infarction requiring rehab 6/3 - orthostatic syncope 6/6 - weakness 7/2-11 - acute respiratory failure due to MG crisis/E coli urosepsis 9/24-28 - bleeding from AV fistula with early shock    Assessment & Plan:   Principal Problem:   Weakness Active Problems:   Diabetes mellitus due to underlying condition with diabetic nephropathy (HCC)   CKD (chronic kidney disease) stage V requiring chronic dialysis (HCC)   Atrial fibrillation, permanent (HCC)   Supratherapeutic INR   Anemia   Myasthenia gravis (HCC)   Benign essential HTN   Pressure injury of skin   UTI (urinary tract infection)   Left elbow pain   Weakness -Patient with h/o myasthenia gravis fairly recently diagnosed but with  several recent exacerbations and rapid progression to non-ambulatory status  -Presented today with acute on chronic weakness -Thought to have UTI as cause by ER physician (see below) -While UTI is a possible cause, his left elbow issue could be a factor but his MG is an even greater concern (see below) -Request PT consult  -will give next dose of IVIG and see if improvement (due next week)  UTI -Despite ESRD, patient does make urine -culture shows pseudomonas -IV abx changed to cover  Left elbow pain -He has an effusion in the bursa and appears to be septic bursitis -doubt gout -seen by ortho: not fracture, s/p tap-- culture shows staph a.  -- continue vanc  Myasthenia gravis -He was unable to tolerate Mestinon due to diarrhea -He was previously treated with IVIg  -He has been maintained on 20 mg prednisone daily -will dose IVIG while in hospital (due next week) -NIF/VC  ESRD on MWF HD -Patient on chronic MWF HD -Nephrology following  DM -A1c was 5.7 in 7/18 -Cover with moderate-scale SSI  Afib on anticoagulation -Rate controlled -Subtherapeutic on Coumadin - will ask pharmacy to dose  Anemia from CKD -Slightly lower than prior -Will trend  Pressure ulcers -These do not appear to be the source of his current illness but also are in need of assistance in managing them -Wound care consult: Continue  heel foam to the left heel, offload with pillows Add enzymatic debridement ointment to the right lateral malleolus  HTN -Coreg dose was changed from 3.125 mg BID to 25 mg BID over a month ago; will continue increased dose (confirmed by pharmacy)      DVT prophylaxis:  Fully anticoagulated   Code Status: Full Code   Family Communication: Called, no answer  Disposition Plan:  Home once plan in place   Consultants:  Ortho nephro  Subjective: Elbow feels better Says he feels less weak  Objective: Vitals:   07/28/17 2158 07/28/17 2330 07/29/17  0535 07/29/17 0927  BP: (!) 123/51  129/60 140/60  Pulse: (!) 103  85 84  Resp: 20  20   Temp: 98.7 F (37.1 C)  98.7 F (37.1 C)   TempSrc: Oral  Oral   SpO2: 97%  99%   Weight:      Height:  5\' 10"  (1.778 m)      Intake/Output Summary (Last 24 hours) at 07/29/2017 1339 Last data filed at 07/29/2017 1049 Gross per 24 hour  Intake 567 ml  Output 3141 ml  Net -2574 ml   Filed Weights   07/27/17 2300 07/28/17 1650 07/28/17 2100  Weight: 80.3 kg (177 lb) 81.5 kg (179 lb 10.8 oz) 78.4 kg (172 lb 13.5 oz)    Examination:  General exam: NAD- weak appearing Respiratory system: clear Cardiovascular system: irr Gastrointestinal system: +BS, soft Central nervous system: A+Ox3 Skin: areas with bruising especially on arms Psychiatry: mood appropriate     Data Reviewed: I have personally reviewed following labs and imaging studies  CBC: Recent Labs  Lab 07/27/17 1608 07/28/17 0750 07/29/17 0427  WBC 13.4* 9.9 8.2  NEUTROABS 12.2*  --   --   HGB 10.3* 9.9* 9.5*  HCT 33.0* 30.9* 30.3*  MCV 105.1* 103.0* 104.1*  PLT 214 150 983   Basic Metabolic Panel: Recent Labs  Lab 07/27/17 1608 07/28/17 0750 07/29/17 0427  NA 130* 135 135  K 5.5* 4.7 3.6  CL 92* 98* 99*  CO2 25 24 27   GLUCOSE 406* 58* 119*  BUN 69* 77* 30*  CREATININE 8.57* 9.05* 4.71*  CALCIUM 9.8 9.3 8.2*   GFR: Estimated Creatinine Clearance: 13.1 mL/min (A) (by C-G formula based on SCr of 4.71 mg/dL (H)). Liver Function Tests: Recent Labs  Lab 07/27/17 1608  AST 20  ALT 22  ALKPHOS 45  BILITOT 0.6  PROT 5.8*  ALBUMIN 2.9*   No results for input(s): LIPASE, AMYLASE in the last 168 hours. No results for input(s): AMMONIA in the last 168 hours. Coagulation Profile: Recent Labs  Lab 07/27/17 1608 07/28/17 0750 07/29/17 0427  INR 1.87 2.21 2.22   Cardiac Enzymes: Recent Labs  Lab 07/27/17 1925  TROPONINI <0.03   BNP (last 3 results) No results for input(s): PROBNP in the last 8760  hours. HbA1C: No results for input(s): HGBA1C in the last 72 hours. CBG: Recent Labs  Lab 07/28/17 1019 07/28/17 1232 07/28/17 2221 07/29/17 0758 07/29/17 1221  GLUCAP 132* 196* 226* 100* 265*   Lipid Profile: No results for input(s): CHOL, HDL, LDLCALC, TRIG, CHOLHDL, LDLDIRECT in the last 72 hours. Thyroid Function Tests: No results for input(s): TSH, T4TOTAL, FREET4, T3FREE, THYROIDAB in the last 72 hours. Anemia Panel: No results for input(s): VITAMINB12, FOLATE, FERRITIN, TIBC, IRON, RETICCTPCT in the last 72 hours. Urine analysis:    Component Value Date/Time   COLORURINE YELLOW 07/27/2017 [place] (A) 07/27/2017 1609  APPEARANCEUR TURBID (A) 07/27/2017 1609   APPEARANCEUR TURBID (A) 07/27/2017 1609   LABSPEC 1.010 07/27/2017 1609   LABSPEC 1.010 07/27/2017 1609   PHURINE 8.0 07/27/2017 1609   PHURINE 8.0 07/27/2017 1609   GLUCOSEU >=500 (A) 07/27/2017 1609   GLUCOSEU >=500 (A) 07/27/2017 1609   HGBUR SMALL (A) 07/27/2017 1609   HGBUR SMALL (A) 07/27/2017 1609   BILIRUBINUR NEGATIVE 07/27/2017 1609   BILIRUBINUR NEGATIVE 07/27/2017 1609   KETONESUR NEGATIVE 07/27/2017 1609   KETONESUR NEGATIVE 07/27/2017 1609   PROTEINUR 100 (A) 07/27/2017 1609   PROTEINUR 100 (A) 07/27/2017 1609   NITRITE NEGATIVE 07/27/2017 1609   NITRITE NEGATIVE 07/27/2017 1609   LEUKOCYTESUR LARGE (A) 07/27/2017 1609   LEUKOCYTESUR LARGE (A) 07/27/2017 1609     ) Recent Results (from the past 240 hour(s))  Urine Culture     Status: Abnormal (Preliminary result)   Collection Time: 07/27/17  4:35 PM  Result Value Ref Range Status   Specimen Description URINE, CLEAN CATCH  Final   Special Requests NONE  Final   Culture (A)  Final    >=100,000 COLONIES/mL PSEUDOMONAS AERUGINOSA REPEATING SUSCEPTIBILITIES    Report Status PENDING  Incomplete  MRSA PCR Screening     Status: None   Collection Time: 07/28/17 12:19 AM  Result Value Ref Range Status   MRSA by PCR NEGATIVE  NEGATIVE Final    Comment:        The GeneXpert MRSA Assay (FDA approved for NASAL specimens only), is one component of a comprehensive MRSA colonization surveillance program. It is not intended to diagnose MRSA infection nor to guide or monitor treatment for MRSA infections.   Body fluid culture     Status: None (Preliminary result)   Collection Time: 07/28/17 10:58 AM  Result Value Ref Range Status   Specimen Description SYNOVIAL  Final   Special Requests NONE  Final   Gram Stain   Final    ABUNDANT WBC PRESENT, PREDOMINANTLY PMN RARE GRAM POSITIVE COCCI IN CLUSTERS    Culture   Final    FEW STAPHYLOCOCCUS AUREUS SUSCEPTIBILITIES TO FOLLOW    Report Status PENDING  Incomplete      Anti-infectives (From admission, onward)   Start     Dose/Rate Route Frequency Ordered Stop   07/30/17 1800  ceFEPIme (MAXIPIME) 2 g in dextrose 5 % 50 mL IVPB     2 g 100 mL/hr over 30 Minutes Intravenous Every M-W-F (Hemodialysis) 07/29/17 1037     07/29/17 1045  ceFEPIme (MAXIPIME) 1 g in dextrose 5 % 50 mL IVPB     1 g 100 mL/hr over 30 Minutes Intravenous  Once 07/29/17 1037     07/28/17 1800  cefTRIAXone (ROCEPHIN) 1 g in dextrose 5 % 50 mL IVPB  Status:  Discontinued     1 g 100 mL/hr over 30 Minutes Intravenous Every 24 hours 07/27/17 2349 07/29/17 1036   07/28/17 1200  vancomycin (VANCOCIN) IVPB 750 mg/150 ml premix     750 mg 150 mL/hr over 60 Minutes Intravenous Every M-W-F (Hemodialysis) 07/27/17 2352     07/28/17 0030  vancomycin (VANCOCIN) 500 mg in sodium chloride 0.9 % 100 mL IVPB     500 mg 100 mL/hr over 60 Minutes Intravenous  Once 07/27/17 2352 07/28/17 0140   07/27/17 2300  piperacillin-tazobactam (ZOSYN) IVPB 3.375 g  Status:  Discontinued     3.375 g 12.5 mL/hr over 240 Minutes Intravenous Every 12 hours 07/27/17 2202 07/27/17 2251   07/27/17  2300  vancomycin (VANCOCIN) IVPB 1000 mg/200 mL premix     1,000 mg 200 mL/hr over 60 Minutes Intravenous  Once 07/27/17  2202 07/28/17 0027   07/27/17 1830  cefTRIAXone (ROCEPHIN) 1 g in dextrose 5 % 50 mL IVPB     1 g 100 mL/hr over 30 Minutes Intravenous  Once 07/27/17 1817 07/27/17 1853       Radiology Studies: Dg Chest 2 View  Result Date: 07/27/2017 CLINICAL DATA:  81 y/o  M; weakness. EXAM: CHEST  2 VIEW COMPARISON:  03/21/2017 CT head FINDINGS: Stable cardiomegaly. Aortic atherosclerosis with calcification. Clear lungs. No pleural effusion or pneumothorax. No acute osseous abnormality is evident. Two lead pacemaker noted. Surgical clips project over left axilla. Right axillary vascular stent. IMPRESSION: Stable cardiomegaly.  No acute pulmonary process identified. Electronically Signed   By: Kristine Garbe M.D.   On: 07/27/2017 17:37   Dg Elbow Complete Left  Result Date: 07/27/2017 CLINICAL DATA:  81 y/o  M; elbow pain. EXAM: LEFT ELBOW - COMPLETE 3+ VIEW COMPARISON:  None. FINDINGS: Lucency in radial head on a single oblique view and elevated anterior fat pad, question radial head fracture. Calcification adjacent to the lateral epicondyles compatible with lateral epicondylitis. Vascular calcifications. No joint dislocation. IMPRESSION: Lucency in radial head on single view and elevated anterior fat pad, question radial head fracture. Electronically Signed   By: Kristine Garbe M.D.   On: 07/27/2017 17:26        Scheduled Meds: . aspirin EC  81 mg Oral QHS  . bupivacaine  10 mL Infiltration Once  . carvedilol  25 mg Oral BID WC  . collagenase   Topical Daily  . [START ON 08/01/2017] darbepoetin (ARANESP) injection - DIALYSIS  150 mcg Intravenous Q Fri-HD  . docusate sodium  100 mg Oral BID  . doxercalciferol  4 mcg Intravenous Q M,W,F-HD  . ezetimibe  10 mg Oral Q supper  . famotidine  20 mg Oral QHS  . feeding supplement (NEPRO CARB STEADY)  237 mL Oral BID BM  . fenofibrate  160 mg Oral Daily  . finasteride  5 mg Oral Daily  . gabapentin  100 mg Oral QHS  . insulin  aspart  0-15 Units Subcutaneous TID WC  . insulin aspart  0-5 Units Subcutaneous QHS  . ipratropium  2 spray Each Nare Daily  . lidocaine  5 mL Other Once  . multivitamin  1 tablet Oral QHS  . predniSONE  20 mg Oral Q breakfast  . warfarin  1.5 mg Oral ONCE-1800  . Warfarin - Pharmacist Dosing Inpatient   Does not apply q1800   Continuous Infusions: . ceFEPime (MAXIPIME) IV    . [START ON 07/30/2017] ceFEPime (MAXIPIME) IV    . [START ON 07/30/2017] ferric gluconate (FERRLECIT/NULECIT) IV    . Immune Globulin 10%    . vancomycin Stopped (07/28/17 2130)     LOS: 0 days    Time spent: 35 min    [place], DO [facility] Pager [phone]  If 7PM-7AM, please contact night-coverage [email or web address] Password [email or web address] 07/29/2017, 1:39 PM

## 2017-07-29 NOTE — Progress Notes (Signed)
NIF (-40) VC 7.5L    PT had strong effort

## 2017-07-29 NOTE — Progress Notes (Signed)
Patient ID: James David, male   DOB: 1936-07-05, 81 y.o.   MRN: [id number]   LOS: 0 days   Subjective: Elbow still sore but better than yesterday   Objective: Vital signs in last 24 hours: Temp:  [97.6 F (36.4 C)-98.7 F (37.1 C)] 98.7 F (37.1 C) (11/13 0535) Pulse Rate:  [71-103] 85 (11/13 0535) Resp:  [20] 20 (11/13 0535) BP: (88-156)/(37-71) 129/60 (11/13 0535) SpO2:  [96 %-100 %] 99 % (11/13 0535) Weight:  [78.4 kg (172 lb 13.5 oz)-81.5 kg (179 lb 10.8 oz)] 78.4 kg (172 lb 13.5 oz) (11/12 2100) Last BM Date: (PTA)   Laboratory  CBC Recent Labs    07/28/17 0750 07/29/17 0427  WBC 9.9 8.2  HGB 9.9* 9.5*  HCT 30.9* 30.3*  PLT 150 197   BMET Recent Labs    07/28/17 0750 07/29/17 0427  NA 135 135  K 4.7 3.6  CL 98* 99*  CO2 24 27  GLUCOSE 58* 119*  BUN 77* 30*  CREATININE 9.05* 4.71*  CALCIUM 9.3 8.2*     Physical Exam General appearance: alert and no distress  LUE: ROM 170-75 (slightly improved flexion)   Assessment/Plan: Left elbow pain -- Given GS this likely represents septic bursitis. Continue abx while awaiting culture results. Ok to d/c home from orthopedic standpoint on broad-spectrum abx before culture results are back. No WB restrictions, no bracing required. F/u with Dr. Tamera Punt as OP in 1-2 weeks.    Lisette Abu, PA-C Orthopedic Surgery [phone] 07/29/2017

## 2017-07-29 NOTE — Progress Notes (Signed)
[facility] Progress Note  Subjective: no new c/o, not much stronger.   Vitals:   07/28/17 2158 07/28/17 2330 07/29/17 0535 07/29/17 0927  BP: (!) 123/51  129/60 140/60  Pulse: (!) 103  85 84  Resp: 20  20   Temp: 98.7 F (37.1 C)  98.7 F (37.1 C)   TempSrc: Oral  Oral   SpO2: 97%  99%   Weight:      Height:  5\' 10"  (1.778 m)      Inpatient medications: . aspirin EC  81 mg Oral QHS  . bupivacaine  10 mL Infiltration Once  . carvedilol  25 mg Oral BID WC  . collagenase   Topical Daily  . [START ON 08/01/2017] darbepoetin (ARANESP) injection - DIALYSIS  150 mcg Intravenous Q Fri-HD  . docusate sodium  100 mg Oral BID  . doxercalciferol  4 mcg Intravenous Q M,W,F-HD  . ezetimibe  10 mg Oral Q supper  . famotidine  20 mg Oral QHS  . feeding supplement (NEPRO CARB STEADY)  237 mL Oral BID BM  . fenofibrate  160 mg Oral Daily  . finasteride  5 mg Oral Daily  . gabapentin  100 mg Oral QHS  . insulin aspart  0-15 Units Subcutaneous TID WC  . insulin aspart  0-5 Units Subcutaneous QHS  . ipratropium  2 spray Each Nare Daily  . lidocaine  5 mL Other Once  . multivitamin  1 tablet Oral QHS  . predniSONE  20 mg Oral Q breakfast  . warfarin  1.5 mg Oral ONCE-1800  . Warfarin - Pharmacist Dosing Inpatient   Does not apply q1800   . ceFEPime (MAXIPIME) IV    . [START ON 07/30/2017] ceFEPime (MAXIPIME) IV    . [START ON 07/30/2017] ferric gluconate (FERRLECIT/NULECIT) IV    . Immune Globulin 10%    . vancomycin Stopped (07/28/17 2130)   acetaminophen **OR** acetaminophen, calcium carbonate (dosed in mg elemental calcium), camphor-menthol **AND** hydrOXYzine, docusate sodium, feeding supplement (NEPRO CARB STEADY), ipratropium-albuterol, LORazepam, ondansetron **OR** ondansetron (ZOFRAN) IV, sorbitol, traZODone, zolpidem  Exam: Gen alert, up in chair and responding  No rash, cyanosis or gangrene Sclera anicteric, throat clear No jvd or bruits Chest clear  bilat RRR soft SEM no rg Abd soft ntnd no mass or ascites +bs GU normal male MS no joint effusions or deformity Ext no sig LE edema / no wounds or ulcers Neuro is alert, Ox 3 , nf     Dialysis: MWF [facility] ([facility]) 4h   76kg   2/2.25 bath  AVF RUA  Hep none (on coumadin) -hect 4 ug -venofer 50 / wk    Impression: 1. Acute on chronic gen'd weakness - w/ hx of myasthenia gravis. Per primary.  2. Abnormal UA/ possible UTI - hx UTI in June and July this year 3. Myasthenia gravis - per primary, on prednisone and was started on IVIG recently in OP setting.  4. ESRD - on HD MWF. +2kg today.  HD tomorrow.  5. DM - per primary 6. AFib on coumadin 7. Anemia of CKD 8. MBD of CKD - cont meds   Plan - as above   Kelly Splinter MD [facility] pager [phone]   07/29/2017, 1:29 PM   Recent Labs  Lab 07/27/17 1608 07/28/17 0750 07/29/17 0427  NA 130* 135 135  K 5.5* 4.7 3.6  CL 92* 98* 99*  CO2 25 24 27   GLUCOSE 406* 58* 119*  BUN 69*  77* 30*  CREATININE 8.57* 9.05* 4.71*  CALCIUM 9.8 9.3 8.2*   Recent Labs  Lab 07/27/17 1608  AST 20  ALT 22  ALKPHOS 45  BILITOT 0.6  PROT 5.8*  ALBUMIN 2.9*   Recent Labs  Lab 07/27/17 1608 07/28/17 0750 07/29/17 0427  WBC 13.4* 9.9 8.2  NEUTROABS 12.2*  --   --   HGB 10.3* 9.9* 9.5*  HCT 33.0* 30.9* 30.3*  MCV 105.1* 103.0* 104.1*  PLT 214 150 197   Iron/TIBC/Ferritin/ %Sat    Component Value Date/Time   IRON 26 (L) 03/18/2017 1017   TIBC 211 (L) 03/18/2017 1017   FERRITIN 457 (H) 03/18/2017 1017   IRONPCTSAT 12 (L) 03/18/2017 1017

## 2017-07-30 ENCOUNTER — Inpatient Hospital Stay (HOSPITAL_COMMUNITY): Payer: Medicare Other

## 2017-07-30 LAB — RENAL FUNCTION PANEL
Albumin: 2.3 g/dL — ABNORMAL LOW (ref 3.5–5.0)
Anion gap: 11 (ref 5–15)
BUN: 61 mg/dL — AB (ref 6–20)
CALCIUM: 8.7 mg/dL — AB (ref 8.9–10.3)
CO2: 23 mmol/L (ref 22–32)
CREATININE: 6.78 mg/dL — AB (ref 0.61–1.24)
Chloride: 97 mmol/L — ABNORMAL LOW (ref 101–111)
GFR calc Af Amer: 8 mL/min — ABNORMAL LOW (ref >60)
GFR calc non Af Amer: 7 mL/min — ABNORMAL LOW (ref >60)
GLUCOSE: 92 mg/dL (ref 65–99)
PHOSPHORUS: 5.3 mg/dL — AB (ref 2.5–4.6)
Potassium: 4 mmol/L (ref 3.5–5.1)
SODIUM: 131 mmol/L — AB (ref 135–145)

## 2017-07-30 LAB — URINE CULTURE: Culture: >=100000 — AB

## 2017-07-30 LAB — GLUCOSE, CAPILLARY
GLUCOSE-CAPILLARY: 91 mg/dL (ref 65–99)
Glucose-Capillary: 215 mg/dL — ABNORMAL HIGH (ref 65–99)
Glucose-Capillary: 349 mg/dL — ABNORMAL HIGH (ref 65–99)
Glucose-Capillary: 430 mg/dL — ABNORMAL HIGH (ref 65–99)
Glucose-Capillary: 295 mg/dL — ABNORMAL HIGH (ref 65–99)

## 2017-07-30 LAB — CBC
HCT: 27.9 % — ABNORMAL LOW (ref 39.0–52.0)
HEMOGLOBIN: 8.8 g/dL — AB (ref 13.0–17.0)
MCH: 32.6 pg (ref 26.0–34.0)
MCHC: 31.5 g/dL (ref 30.0–36.0)
MCV: 103.3 fL — ABNORMAL HIGH (ref 78.0–100.0)
Platelets: 185 10*3/uL (ref 150–400)
RBC: 2.7 MIL/uL — ABNORMAL LOW (ref 4.22–5.81)
RDW: 19.9 % — AB (ref 11.5–15.5)
WBC: 6.9 10*3/uL (ref 4.0–10.5)

## 2017-07-30 LAB — PROTIME-INR
INR: 2.21
Prothrombin Time: 24.3 seconds — ABNORMAL HIGH (ref 11.4–15.2)

## 2017-07-30 MED ORDER — LIDOCAINE-PRILOCAINE 2.5-2.5 % EX CREA: 1.0000 "application " | TOPICAL_CREAM | CUTANEOUS | Status: DC | PRN

## 2017-07-30 MED ORDER — DEXTROSE 5 % IV SOLN
2.0000 g | INTRAVENOUS | Status: DC
  Administered 2017-07-30: 2 g via INTRAVENOUS
  Filled 2017-07-30: qty 2

## 2017-07-30 MED ORDER — PENTAFLUOROPROP-TETRAFLUOROETH EX AERO: 1.0000 "application " | INHALATION_SPRAY | CUTANEOUS | Status: DC | PRN

## 2017-07-30 MED ORDER — DOXERCALCIFEROL 4 MCG/2ML IV SOLN
INTRAVENOUS | Status: AC
  Filled 2017-07-30: qty 2

## 2017-07-30 MED ORDER — SODIUM CHLORIDE 0.9 % IV SOLN: 62.5000 mg | INTRAVENOUS | Status: DC

## 2017-07-30 MED ORDER — VANCOMYCIN HCL IN DEXTROSE 750-5 MG/150ML-% IV SOLN
INTRAVENOUS | Status: AC
  Filled 2017-07-30: qty 150

## 2017-07-30 MED ORDER — HEPARIN SODIUM (PORCINE) 1000 UNIT/ML DIALYSIS: 1000.0000 [IU] | INTRAMUSCULAR | Status: DC | PRN

## 2017-07-30 MED ORDER — WARFARIN SODIUM 1 MG PO TABS
1.5000 mg | ORAL_TABLET | ORAL | Status: DC
  Filled 2017-07-30: qty 1

## 2017-07-30 MED ORDER — SODIUM CHLORIDE 0.9 % IV SOLN: 100.0000 mL | INTRAVENOUS | Status: DC | PRN

## 2017-07-30 MED ORDER — INSULIN ASPART 100 UNIT/ML ~~LOC~~ SOLN
8.0000 [IU] | Freq: Once | SUBCUTANEOUS | Status: AC
  Administered 2017-07-30: 8 [IU] via SUBCUTANEOUS

## 2017-07-30 MED ORDER — LIDOCAINE HCL (PF) 1 % IJ SOLN: 5.0000 mL | INTRAMUSCULAR | Status: DC | PRN

## 2017-07-30 MED ORDER — ALTEPLASE 2 MG IJ SOLR: 2.0000 mg | Freq: Once | INTRAMUSCULAR | Status: DC | PRN

## 2017-07-30 MED ORDER — WARFARIN SODIUM 1 MG PO TABS
1.0000 mg | ORAL_TABLET | ORAL | Status: DC
  Administered 2017-07-30: 1 mg via ORAL
  Filled 2017-07-30: qty 1

## 2017-07-30 NOTE — NC FL2 (Addendum)
[place] LEVEL OF CARE SCREENING TOOL     IDENTIFICATION  Patient Name: James David Birthdate: 06-11-1936 Sex: male Admission Date (Current Location): 07/27/2017  [facility] and [place] Number:  Herbalist and Address:  [facility], [place] [address] [place], [place] [address]      Provider Number: [id number]  Attending Physician Name and Address:  Geradine Girt, DO  Relative Name and Phone Number:  Andi Hence [phone]    Current Level of Care: Hospital Recommended Level of Care: [place] Prior Approval Number:    Date Approved/Denied:   PASRR Number: [id number]  Discharge Plan: SNF    Current Diagnoses: Patient Active Problem List   Diagnosis Date Noted  . UTI (urinary tract infection) 07/27/2017  . Weakness 07/27/2017  . Left elbow pain 07/27/2017  . Pressure injury of skin 06/11/2017  . Bleeding at insertion site 06/09/2017  . Myasthenia gravis ([place])   . Benign essential HTN   . Labile blood glucose   . Dysphagia   . Leukocytosis   . Acute respiratory failure with hypoxia ([place])   . Myasthenia gravis in crisis ([place]) 03/17/2017  . History of CVA in adulthood 03/17/2017  . Atrial fibrillation, permanent ([place]) 03/17/2017  . Supratherapeutic INR 03/17/2017  . Dyslipidemia 03/17/2017  . Anemia 03/17/2017  . Low back pain 02/28/2017  . Neck pain 02/28/2017  . Polyneuropathy 02/28/2017  . Gait disturbance, post-stroke   . Subcortical infarction ([place]) 02/06/2017  . Intracranial vascular stenosis 02/04/2017  . Syncope   . Orthostatic hypotension   . Stroke ([place]) 02/01/2017  . TIA (transient ischemic attack) 01/29/2017  . CKD (chronic kidney disease) stage V requiring chronic dialysis ([place]) 01/29/2017  . DDD (degenerative disc disease) 01/29/2017  . ESRD (end stage renal disease) on dialysis ([place])   . Melanoma of skin ([place]) 03/13/2016  . Pacemaker 10/13/2014  . Chronic diastolic heart  failure ([place]) 03/24/2014  . Second-degree heart block 12/13/2013  . Essential hypertension 12/13/2013  . Diabetes mellitus due to underlying condition with diabetic nephropathy ([place]) 12/13/2013  . Hyperlipidemia 12/13/2013    Orientation RESPIRATION BLADDER Height & Weight     Self, Time, Place, Situation  Normal Incontinent, External catheter Weight: 78.4 kg (172 lb 13.5 oz) Height:  5\' 10"  (177.8 cm)  BEHAVIORAL SYMPTOMS/MOOD NEUROLOGICAL BOWEL NUTRITION STATUS      Continent Diet(Please see DC Summary)  AMBULATORY STATUS COMMUNICATION OF NEEDS Skin   Extensive Assist Verbally PU Stage and Appropriate Care(Pressure injury stage II on heel and buttocks; unstageable on ankle)                       Personal Care Assistance Level of Assistance  Bathing, Feeding, Dressing Bathing Assistance: Maximum assistance Feeding assistance: Limited assistance Dressing Assistance: Limited assistance     Functional Limitations Info             SPECIAL CARE FACTORS FREQUENCY  PT (By licensed PT)     PT Frequency: 5x/week              Contractures      Additional Factors Info  Code Status, Allergies, Insulin Sliding Scale Code Status Info: Full Allergies Info: Amlodipine Besylate, Lisinopril, Statins, Ropinirole, Tape   Insulin Sliding Scale Info: 3x daily with meals and at bedtime       Current Medications (07/30/2017):  This is the current hospital active medication list Current Facility-Administered Medications  Medication  Dose Route Frequency Provider Last Rate Last Dose  . acetaminophen (TYLENOL) tablet 650 mg  650 mg Oral Q6H PRN Karmen Bongo, MD   650 mg at 07/29/17 2200   Or  . acetaminophen (TYLENOL) suppository 650 mg  650 mg Rectal Q6H PRN Karmen Bongo, MD      . aspirin EC tablet 81 mg  81 mg Oral Ivery Quale, MD   81 mg at 07/29/17 2159  . calcium carbonate (dosed in mg elemental calcium) suspension 500 mg of elemental calcium  500 mg of  elemental calcium Oral Q6H PRN Karmen Bongo, MD      . camphor-menthol [facility]) lotion 1 application  1 application Topical M3N PRN Karmen Bongo, MD       And  . hydrOXYzine (ATARAX/VISTARIL) tablet 25 mg  25 mg Oral Q8H PRN Karmen Bongo, MD      . carvedilol (COREG) tablet 25 mg  25 mg Oral BID WC Karmen Bongo, MD   25 mg at 07/29/17 1814  . ceFEPIme (MAXIPIME) 2 g in dextrose 5 % 50 mL IVPB  2 g Intravenous Q M,W,F-HD Vann, Jessica U, DO      . collagenase (SANTYL) ointment   Topical Daily Vann, Jessica U, DO      . [START ON 08/01/2017] Darbepoetin Alfa (ARANESP) injection 150 mcg  150 mcg Intravenous Q Fri-HD Alric Seton, PA-C      . docusate sodium (COLACE) capsule 100 mg  100 mg Oral BID Karmen Bongo, MD   100 mg at 07/29/17 0934  . docusate sodium (ENEMEEZ) enema 283 mg  1 enema Rectal PRN Karmen Bongo, MD      . doxercalciferol (HECTOROL) injection 4 mcg  4 mcg Intravenous Q M,W,F-HD Alric Seton, PA-C   4 mcg at 07/28/17 1719  . ezetimibe (ZETIA) tablet 10 mg  10 mg Oral Q supper Karmen Bongo, MD   10 mg at 07/29/17 1814  . famotidine (PEPCID) tablet 20 mg  20 mg Oral Ivery Quale, MD   20 mg at 07/29/17 2159  . feeding supplement (NEPRO CARB STEADY) liquid 237 mL  237 mL Oral TID PRN Karmen Bongo, MD      . feeding supplement (NEPRO CARB STEADY) liquid 237 mL  237 mL Oral BID BM Karmen Bongo, MD   237 mL at 07/29/17 1346  . fenofibrate tablet 160 mg  160 mg Oral Daily Karmen Bongo, MD   160 mg at 07/29/17 0933  . ferric gluconate (NULECIT) 62.5 mg in sodium chloride 0.9 % 100 mL IVPB  62.5 mg Intravenous Q Wed-HD Alric Seton, PA-C      . finasteride (PROSCAR) tablet 5 mg  5 mg Oral Daily Karmen Bongo, MD   5 mg at 07/29/17 0931  . gabapentin (NEURONTIN) capsule 100 mg  100 mg Oral QHS Karmen Bongo, MD   100 mg at 07/29/17 2200  . insulin aspart (novoLOG) injection 0-15 Units  0-15 Units Subcutaneous TID WC Karmen Bongo, MD   5 Units  at 07/29/17 1812  . insulin aspart (novoLOG) injection 0-5 Units  0-5 Units Subcutaneous QHS Karmen Bongo, MD   2 Units at 07/29/17 2209  . ipratropium (ATROVENT) 0.06 % nasal spray 2 spray  2 spray Each Nare Daily Karmen Bongo, MD   2 spray at 07/29/17 [phone]  . ipratropium-albuterol (DUONEB) 0.5-2.5 (3) MG/3ML nebulizer solution 3 mL  3 mL Nebulization Q6H PRN Karmen Bongo, MD      . LORazepam (ATIVAN) tablet 1 mg  1 mg Oral Q6H PRN Karmen Bongo, MD      . multivitamin (RENA-VIT) tablet 1 tablet  1 tablet Oral Ivery Quale, MD   1 tablet at 07/29/17 2200  . ondansetron (ZOFRAN) tablet 4 mg  4 mg Oral Q6H PRN Karmen Bongo, MD       Or  . ondansetron [facility]) injection 4 mg  4 mg Intravenous Q6H PRN Karmen Bongo, MD      . predniSONE (DELTASONE) tablet 20 mg  20 mg Oral Q breakfast Karmen Bongo, MD   20 mg at 07/29/17 0934  . sorbitol 70 % solution 30 mL  30 mL Oral PRN Karmen Bongo, MD      . traZODone (DESYREL) tablet 50 mg  50 mg Oral QHS PRN Karmen Bongo, MD   50 mg at 07/28/17 0139  . vancomycin (VANCOCIN) IVPB 750 mg/150 ml premix  750 mg Intravenous Q M,W,F-HD Karmen Bongo, MD   Stopped at 07/28/17 2130  . Warfarin - Pharmacist Dosing Inpatient   Does not apply q1800 Karmen Bongo, MD      . zolpidem Lorrin Mais) tablet 5 mg  5 mg Oral QHS PRN Karmen Bongo, MD         Discharge Medications: Please see discharge summary for a list of discharge medications.  Relevant Imaging Results:  Relevant Lab Results:   Additional Information SS#: [id number]; receives dialysis MWF at [place], [place]

## 2017-07-30 NOTE — Progress Notes (Signed)
RT NOTE:  NIF: > -40

## 2017-07-30 NOTE — Progress Notes (Signed)
PROGRESS NOTE    James David  IHK:[id number] DOB: March 12, 1936 DOA: 07/27/2017 PCP: [name], MD   Outpatient Specialists:     Brief Narrative:  James David is a 81 y.o. male with medical history significant of CVA in 5/18; pacemaker placement; HTN; HLD; ESRD on MWF HD; DM; afib on Coumadin; and myasthenia gravis presenting with weakness.  He reports that the myasthenia gravis has "really put the locks on me."  He has been unable to walk since shortly after his CVA in 5/18 (CVA 5/16, hospitalized, seemed to be getting better and was sent home but the next day he started falling and was unable to walk and hasn't been able to since). His wife called the [facility] today because he has been so weak the last few days - she had to call EMS to get him out of the floor.  The nurse came to see him and thought he needed to be seen and so she callled 911 because he is so weak.  For the last few days to week, it is hard to get him up into the wheelchair or to get him back into the bed.  Wednesday night was the first time his wife had to call EMS due to a fall in several weeks.  Since Friday, it has gotten even worse.    Hospitalizations/ER visits in the last 6 months: 5/16-6/2 - right subcortical infarction requiring rehab 6/3 - orthostatic syncope 6/6 - weakness 7/2-11 - acute respiratory failure due to MG crisis/E coli urosepsis 9/24-28 - bleeding from AV fistula with early shock    Assessment & Plan:   Principal Problem:   Weakness Active Problems:   Diabetes mellitus due to underlying condition with diabetic nephropathy (HCC)   CKD (chronic kidney disease) stage V requiring chronic dialysis (HCC)   Atrial fibrillation, permanent (HCC)   Supratherapeutic INR   Anemia   Myasthenia gravis (HCC)   Benign essential HTN   Pressure injury of skin   UTI (urinary tract infection)   Left elbow pain   Weakness -Patient with h/o myasthenia gravis fairly recently diagnosed but with  several recent exacerbations  -Presented today with acute on chronic weakness-- appears multifactorial-- wife not able to manage at home, will need SNF -will give next dose of IVIG and see if improvement (due next week) -neuro consult  UTI -Despite ESRD, patient does make urine -culture shows pseudomonas -IV abx changed to cover- ? De-escalate before d/c to SNF  Left elbow pain -He has an effusion in the bursa and appears to be septic bursitis -doubt gout -seen by ortho: not fracture, s/p tap-- culture shows staph a.  -- continue vanc- ? De-escalate before d/c  Myasthenia gravis -He was unable to tolerate Mestinon due to diarrhea -He was previously treated with IVIG -He has been maintained on 20 mg prednisone daily -will dose IVIG while in hospital (due next week) -NIF/VC good  ESRD on MWF HD -Patient on chronic MWF HD -Nephrology following  DM -A1c was 5.7 in 7/18 -Cover with moderate-scale SSI  Afib on anticoagulation -Rate controlled -Subtherapeutic on Coumadin - will ask pharmacy to dose  Anemia from CKD -Slightly lower than prior -Will trend  Pressure ulcers -These do not appear to be the source of his current illness but also are in need of assistance in managing them -Wound care consult: Continue heel foam to the left heel, offload with pillows Add enzymatic debridement ointment to the right lateral malleolus  HTN -Coreg  dose was changed from 3.125 mg BID to 25 mg BID over a month ago; will continue increased dose (confirmed by pharmacy)      DVT prophylaxis:  Fully anticoagulated   Code Status: Full Code   Family Communication: wife  Disposition Plan:  Home once plan in place   Consultants:  Ortho nephro  Subjective: On HD, feels stronger, has not been out of bed  Objective: Vitals:   07/30/17 1100 07/30/17 1130 07/30/17 1146 07/30/17 1321  BP: (!) 148/53 (!) 146/66 (!) 158/66 (!) 153/57  Pulse: 83 82 87 92  Resp: 15 17 18  18   Temp:   97.8 F (36.6 C) 98.4 F (36.9 C)  TempSrc:   Oral Oral  SpO2:    97%  Weight:   76 kg (167 lb 8.8 oz)   Height:        Intake/Output Summary (Last 24 hours) at 07/30/2017 1509 Last data filed at 07/30/2017 1146 Gross per 24 hour  Intake 120 ml  Output 2900 ml  Net -2780 ml   Filed Weights   07/28/17 2100 07/30/17 0810 07/30/17 1146  Weight: 78.4 kg (172 lb 13.5 oz) 78.4 kg (172 lb 13.5 oz) 76 kg (167 lb 8.8 oz)    Examination:  General exam: on HD, color improved Respiratory system: no wheezing Cardiovascular system: irr Gastrointestinal system: +BS, soft Central nervous system: alert, NAD Skin: multiple areas of bruising     Data Reviewed: I have personally reviewed following labs and imaging studies  CBC: Recent Labs  Lab 07/27/17 1608 07/28/17 0750 07/29/17 0427 07/30/17 0815  WBC 13.4* 9.9 8.2 6.9  NEUTROABS 12.2*  --   --   --   HGB 10.3* 9.9* 9.5* 8.8*  HCT 33.0* 30.9* 30.3* 27.9*  MCV 105.1* 103.0* 104.1* 103.3*  PLT 214 150 197 299   Basic Metabolic Panel: Recent Labs  Lab 07/27/17 1608 07/28/17 0750 07/29/17 0427 07/30/17 0815  NA 130* 135 135 131*  K 5.5* 4.7 3.6 4.0  CL 92* 98* 99* 97*  CO2 25 24 27 23   GLUCOSE 406* 58* 119* 92  BUN 69* 77* 30* 61*  CREATININE 8.57* 9.05* 4.71* 6.78*  CALCIUM 9.8 9.3 8.2* 8.7*  PHOS  --   --   --  5.3*   GFR: Estimated Creatinine Clearance: 9.1 mL/min (A) (by C-G formula based on SCr of 6.78 mg/dL (H)). Liver Function Tests: Recent Labs  Lab 07/27/17 1608 07/30/17 0815  AST 20  --   ALT 22  --   ALKPHOS 45  --   BILITOT 0.6  --   PROT 5.8*  --   ALBUMIN 2.9* 2.3*   No results for input(s): LIPASE, AMYLASE in the last 168 hours. No results for input(s): AMMONIA in the last 168 hours. Coagulation Profile: Recent Labs  Lab 07/27/17 1608 07/28/17 0750 07/29/17 0427 07/30/17 0600  INR 1.87 2.21 2.22 2.21   Cardiac Enzymes: Recent Labs  Lab 07/27/17 1925  TROPONINI <0.03    BNP (last 3 results) No results for input(s): PROBNP in the last 8760 hours. HbA1C: No results for input(s): HGBA1C in the last 72 hours. CBG: Recent Labs  Lab 07/29/17 1221 07/29/17 1659 07/29/17 2112 07/30/17 0751 07/30/17 1233  GLUCAP 265* 220* 240* 91 215*   Lipid Profile: No results for input(s): CHOL, HDL, LDLCALC, TRIG, CHOLHDL, LDLDIRECT in the last 72 hours. Thyroid Function Tests: No results for input(s): TSH, T4TOTAL, FREET4, T3FREE, THYROIDAB in the last 72 hours. Anemia Panel:  No results for input(s): VITAMINB12, FOLATE, FERRITIN, TIBC, IRON, RETICCTPCT in the last 72 hours. Urine analysis:    Component Value Date/Time   COLORURINE YELLOW 07/27/2017 1609   COLORURINE AMBER (A) 07/27/2017 1609   APPEARANCEUR TURBID (A) 07/27/2017 1609   APPEARANCEUR TURBID (A) 07/27/2017 1609   LABSPEC 1.010 07/27/2017 1609   LABSPEC 1.010 07/27/2017 1609   PHURINE 8.0 07/27/2017 1609   PHURINE 8.0 07/27/2017 1609   GLUCOSEU >=500 (A) 07/27/2017 1609   GLUCOSEU >=500 (A) 07/27/2017 1609   HGBUR SMALL (A) 07/27/2017 1609   HGBUR SMALL (A) 07/27/2017 1609   BILIRUBINUR NEGATIVE 07/27/2017 1609   BILIRUBINUR NEGATIVE 07/27/2017 1609   KETONESUR NEGATIVE 07/27/2017 1609   KETONESUR NEGATIVE 07/27/2017 1609   PROTEINUR 100 (A) 07/27/2017 1609   PROTEINUR 100 (A) 07/27/2017 1609   NITRITE NEGATIVE 07/27/2017 1609   NITRITE NEGATIVE 07/27/2017 1609   LEUKOCYTESUR LARGE (A) 07/27/2017 1609   LEUKOCYTESUR LARGE (A) 07/27/2017 1609     ) Recent Results (from the past 240 hour(s))  Urine Culture     Status: Abnormal   Collection Time: 07/27/17  4:35 PM  Result Value Ref Range Status   Specimen Description URINE, CLEAN CATCH  Final   Special Requests NONE  Final   Culture >=100,000 COLONIES/mL PSEUDOMONAS AERUGINOSA (A)  Final   Report Status 07/30/2017 FINAL  Final   Organism ID, Bacteria PSEUDOMONAS AERUGINOSA (A)  Final      Susceptibility   Pseudomonas aeruginosa -  MIC*    CEFTAZIDIME <=1 SENSITIVE Sensitive     CIPROFLOXACIN <=0.25 SENSITIVE Sensitive     GENTAMICIN <=1 SENSITIVE Sensitive     IMIPENEM <=0.25 SENSITIVE Sensitive     PIP/TAZO 8 SENSITIVE Sensitive     CEFEPIME <=1 SENSITIVE Sensitive     * >=100,000 COLONIES/mL PSEUDOMONAS AERUGINOSA  MRSA PCR Screening     Status: None   Collection Time: 07/28/17 12:19 AM  Result Value Ref Range Status   MRSA by PCR NEGATIVE NEGATIVE Final    Comment:        The GeneXpert MRSA Assay (FDA approved for NASAL specimens only), is one component of a comprehensive MRSA colonization surveillance program. It is not intended to diagnose MRSA infection nor to guide or monitor treatment for MRSA infections.   Body fluid culture     Status: None (Preliminary result)   Collection Time: 07/28/17 10:58 AM  Result Value Ref Range Status   Specimen Description SYNOVIAL  Final   Special Requests NONE  Final   Gram Stain   Final    ABUNDANT WBC PRESENT, PREDOMINANTLY PMN RARE GRAM POSITIVE COCCI IN CLUSTERS    Culture FEW STAPHYLOCOCCUS AUREUS  Final   Report Status PENDING  Incomplete   Organism ID, Bacteria STAPHYLOCOCCUS AUREUS  Final      Susceptibility   Staphylococcus aureus - MIC*    CIPROFLOXACIN <=0.5 SENSITIVE Sensitive     ERYTHROMYCIN 0.5 SENSITIVE Sensitive     GENTAMICIN <=0.5 SENSITIVE Sensitive     OXACILLIN 0.5 SENSITIVE Sensitive     TETRACYCLINE 4 SENSITIVE Sensitive     VANCOMYCIN 1 SENSITIVE Sensitive     TRIMETH/SULFA <=10 SENSITIVE Sensitive     CLINDAMYCIN <=0.25 SENSITIVE Sensitive     RIFAMPIN <=0.5 SENSITIVE Sensitive     Inducible Clindamycin NEGATIVE Sensitive     * FEW STAPHYLOCOCCUS AUREUS      Anti-infectives (From admission, onward)   Start     Dose/Rate Route Frequency Ordered Stop  07/30/17 1800  ceFEPIme (MAXIPIME) 2 g in dextrose 5 % 50 mL IVPB  Status:  Discontinued     2 g 100 mL/hr over 30 Minutes Intravenous Every M-W-F (Hemodialysis) 07/29/17 1037  07/30/17 1014   07/30/17 1800  ceFEPIme (MAXIPIME) 2 g in dextrose 5 % 50 mL IVPB     2 g 100 mL/hr over 30 Minutes Intravenous Every M-W-F (1800) 07/30/17 1014     07/30/17 0902  Vancomycin (VANCOCIN) 750-5 MG/150ML-% IVPB    Comments:  Vernell Leep, [place]   : cabinet override      07/30/17 0902 07/30/17 1040   07/29/17 1045  ceFEPIme (MAXIPIME) 1 g in dextrose 5 % 50 mL IVPB     1 g 100 mL/hr over 30 Minutes Intravenous  Once 07/29/17 1037 07/29/17 1532   07/28/17 1800  cefTRIAXone (ROCEPHIN) 1 g in dextrose 5 % 50 mL IVPB  Status:  Discontinued     1 g 100 mL/hr over 30 Minutes Intravenous Every 24 hours 07/27/17 2349 07/29/17 1036   07/28/17 1200  vancomycin (VANCOCIN) IVPB 750 mg/150 ml premix     750 mg 150 mL/hr over 60 Minutes Intravenous Every M-W-F (Hemodialysis) 07/27/17 2352     07/28/17 0030  vancomycin (VANCOCIN) 500 mg in sodium chloride 0.9 % 100 mL IVPB     500 mg 100 mL/hr over 60 Minutes Intravenous  Once 07/27/17 2352 07/28/17 0140   07/27/17 2300  piperacillin-tazobactam (ZOSYN) IVPB 3.375 g  Status:  Discontinued     3.375 g 12.5 mL/hr over 240 Minutes Intravenous Every 12 hours 07/27/17 2202 07/27/17 2251   07/27/17 2300  vancomycin (VANCOCIN) IVPB 1000 mg/200 mL premix     1,000 mg 200 mL/hr over 60 Minutes Intravenous  Once 07/27/17 2202 07/28/17 0027   07/27/17 1830  cefTRIAXone (ROCEPHIN) 1 g in dextrose 5 % 50 mL IVPB     1 g 100 mL/hr over 30 Minutes Intravenous  Once 07/27/17 1817 07/27/17 1853       Radiology Studies: No results found.      Scheduled Meds: . aspirin EC  81 mg Oral QHS  . carvedilol  25 mg Oral BID WC  . collagenase   Topical Daily  . [START ON 08/01/2017] darbepoetin (ARANESP) injection - DIALYSIS  150 mcg Intravenous Q Fri-HD  . docusate sodium  100 mg Oral BID  . doxercalciferol  4 mcg Intravenous Q M,W,F-HD  . ezetimibe  10 mg Oral Q supper  . famotidine  20 mg Oral QHS  . feeding supplement (NEPRO CARB STEADY)  237 mL  Oral BID BM  . fenofibrate  160 mg Oral Daily  . finasteride  5 mg Oral Daily  . gabapentin  100 mg Oral QHS  . insulin aspart  0-15 Units Subcutaneous TID WC  . insulin aspart  0-5 Units Subcutaneous QHS  . ipratropium  2 spray Each Nare Daily  . multivitamin  1 tablet Oral QHS  . predniSONE  20 mg Oral Q breakfast  . warfarin  1 mg Oral Once per day on Mon Wed Fri  . [START ON 07/31/2017] warfarin  1.5 mg Oral Once per day on Sun Tue Thu Sat  . Warfarin - Pharmacist Dosing Inpatient   Does not apply q1800   Continuous Infusions: . ceFEPime (MAXIPIME) IV    . ferric gluconate (FERRLECIT/NULECIT) IV Stopped (07/30/17 0936)  . vancomycin Stopped (07/30/17 1140)     LOS: 1 day    Time spent: 35 min  [place], DO [facility] Pager [phone]  If 7PM-7AM, please contact night-coverage [email or web address] Password [email or web address] 07/30/2017, 3:09 PM

## 2017-07-30 NOTE — Progress Notes (Signed)
Pt missed 1700 dose off insulin, he was off the floor for CT scan. CBG 430, Baltazar Najjar, NP paged. Awaiting response.

## 2017-07-30 NOTE — Progress Notes (Signed)
[facility] for Coumadin Indication: atrial fibrillation  Allergies  Allergen Reactions  . Amlodipine Besylate Swelling    Angioedema   . Lisinopril Other (See Comments)    Renal insufficiency  . Statins Other (See Comments)    Muscle aches  . Ropinirole Other (See Comments)    Causes excessive grogginess the following morning after taking  . Tape Other (See Comments)    SKIN IS THIN AND TAPE WILL ACTUALLY TEAR IT; PLEASE USE COBAN WRAP INSTEAD!!    Patient Measurements: Height: 5\' 10"  (177.8 cm) Weight: 172 lb 13.5 oz (78.4 kg)(Bed Scale) IBW/kg (Calculated) : 73  Vital Signs: Temp: 98.6 F (37 C) (11/14 0810) Temp Source: Oral (11/14 0810) BP: 132/66 (11/14 1029) Pulse Rate: 88 (11/14 1029)  Labs: Recent Labs    07/27/17 1925 07/28/17 0750 07/29/17 0427 07/30/17 0600 07/30/17 0815  HGB  --  9.9* 9.5*  --  8.8*  HCT  --  30.9* 30.3*  --  27.9*  PLT  --  150 197  --  185  LABPROT  --  24.3* 24.4* 24.3*  --   INR  --  2.21 2.22 2.21  --   CREATININE  --  9.05* 4.71*  --  6.78*  TROPONINI <0.03  --   --   --   --     Estimated Creatinine Clearance: 9.1 mL/min (A) (by C-G formula based on SCr of 6.78 mg/dL (H)).  Assessment: 81 y.o. male admitted with weakness/UTI, h/o Afib, to continue Coumadin. INR remains therapeutic at 2.21 and CBC stable. On Aranesp/iron for anemia of ESRD.  Goal of Therapy:  INR 2-3 Monitor platelets by anticoagulation protocol: Yes   Plan:  Home warfarin dose- 1mg  MWF and 1.5mg  TTSS Daily INR for now  James David, PharmD, [place] Clinical Pharmacist Clinical Phone for 07/30/2017 until 3:30pm: [phone] If after 3:30pm, please call main pharmacy at [phone] 07/30/2017 10:32 AM

## 2017-07-30 NOTE — Consult Note (Signed)
NEURO HOSPITALIST CONSULT NOTE   Requestig physician: Dr. Eliseo Squires   Reason for Consult: right calf pain possible myasthenia gravis exacerbation   History obtained from:  Patient     HPI:                                                                                                                                          James David is an 81 y.o. male who is a very poor historian.  Majority of the history is obtained from the wife who is a poor historian also.  Apparently patient has been having right calf pain on and off for the last 2-3 months.  Due to his myasthenia and his recent stroke he is wheelchair-bound.  This is obvious with muscle wasting noted and bilateral thighs and calves.  He is also been dealing with left heel pain and right ankle pain.  When asked about patient's right calf pain he is unable to describe the pain however, after giving him multiple different descriptions he describes it as a achy pain.  He denies any tingling, lancinating, burning, stabbing, electrical pain or pain that radiates from his buttocks.  He denies any pain that is reproduced by pressure or manipulation of his calf.  He denies that there is anything that brings it on, makes it feel better, any position that makes it feel worse or better.  He does note that during dialysis if somebody pushes on his knee on the outer portion it may give him some relief.  Per wife patient has been wheelchair-bound  After palpation, squeezing, dorsiflexing and plantar flexing his right ankle patient denies any pain other than in his ankle.  Of note there is no significant electrolyte  Looking at Dr. Armando Reichert last note on 05/09/2007 he was advised to use a walker when he ambulates.  However per wife was at bedside she does not use a walker but is wheelchair-bound.  Past Medical History:  Diagnosis Date  . Anemia 07/28/2017  . Atrial fibrillation ([place])    on Coumadin  . DDD (degenerative disc  disease)   . Diabetes mellitus without complication ([place])   . ESRD (end stage renal disease) on dialysis ([place])   . Hyperlipidemia   . Hypertension   . Melanoma ([place]) 2010   removed at [facility]  . Second degree heart block    a. s/p STJ dual chamber PPM - Dr [name]  . Stroke [facility]) 01/2017    Past Surgical History:  Procedure Laterality Date  . CATARACT EXTRACTION W/ INTRAOCULAR LENS  IMPLANT, BILATERAL    . COLONOSCOPY    . LAMINECTOMY      Family History  Problem Relation Age of Onset  . Pulmonary embolism Mother   . Diabetes Mother   .  Heart attack Father   . Diabetes Brother      Social History:  reports that  has never smoked. he has never used smokeless tobacco. He reports that he does not drink alcohol or use drugs.  Allergies  Allergen Reactions  . Amlodipine Besylate Swelling    Angioedema   . Lisinopril Other (See Comments)    Renal insufficiency  . Statins Other (See Comments)    Muscle aches  . Ropinirole Other (See Comments)    Causes excessive grogginess the following morning after taking  . Tape Other (See Comments)    SKIN IS THIN AND TAPE WILL ACTUALLY TEAR IT; PLEASE USE COBAN WRAP INSTEAD!!    MEDICATIONS:                                                                                                                     Prior to Admission:  Medications Prior to Admission  Medication Sig Dispense Refill Last Dose  . acetaminophen (TYLENOL) 500 MG tablet Take 500 mg by mouth every 8 (eight) hours as needed for mild pain. (pain/headaches)   07/26/2017 at pm  . aspirin EC 81 MG tablet Take 81 mg by mouth at bedtime.   07/26/2017 at pm  . calcium acetate (PHOSLO) 667 MG capsule Take 667-2,001 mg See admin instructions by mouth. Take 3 capsules (2001 mg) by mouth three times daily with meals, take 1 capsule (667 mg) with snacks   07/27/2017 at lunch  . carvedilol (COREG) 25 MG tablet Take 25 mg 2 (two) times daily by mouth.   07/27/2017 at 830  .  diphenhydrAMINE (BENADRYL) 25 MG tablet Take 25 mg at bedtime by mouth.   07/26/2017 at pm  . ezetimibe (ZETIA) 10 MG tablet Take 1 tablet (10 mg total) by mouth daily. 30 tablet 1 07/27/2017 at am  . fenofibrate 160 MG tablet Take 1 tablet (160 mg total) by mouth daily. 30 tablet 0 07/27/2017 at am  . finasteride (PROSCAR) 5 MG tablet Take 1 tablet (5 mg total) by mouth daily. 30 tablet 1 50/93/2671 at am  . folic acid-vitamin b complex-vitamin c-selenium-zinc (DIALYVITE) 3 MG TABS tablet Take 1 tablet at bedtime by mouth.   07/26/2017 at pm  . gabapentin (NEURONTIN) 100 MG capsule Take 1 capsule (100 mg total) by mouth at bedtime. 30 capsule 1 07/26/2017 at pm  . glimepiride (AMARYL) 1 MG tablet Take 1 tablet (1 mg total) by mouth daily with breakfast. 30 tablet 1 07/27/2017 at am  . Melatonin 3 MG TABS Take 3 mg at bedtime by mouth.   07/26/2017 at pm  . Nutritional Supplements (ENSURE PO) Take 237 mLs See admin instructions by mouth. Drink one can (237 mls) by mouth daily on non-dialysis days (Sunday, Tuesday, Thursday, Saturday)   07/27/2017 at Unknown time  . predniSONE (DELTASONE) 20 MG tablet Take 1 tablet (20 mg total) by mouth daily with breakfast. Please note to continue taking 20 mg tablet for now  and you must be  evaluated by your neurologist (Dr. [name]) to determine next Prednisone dose and frequency. 7 tablet 1 07/27/2017 at am  . warfarin (COUMADIN) 1 MG tablet 1 1/2 tab daily (Patient taking differently: Take 1 mg daily with supper by mouth. ) 30 tablet 1 07/26/2017 at 1700  . Amino Acids-Protein Hydrolys (FEEDING SUPPLEMENT, PRO-STAT SUGAR FREE 64,) LIQD Take 30 mLs by mouth 2 (two) times daily. (Patient not taking: Reported on 06/09/2017) 900 mL 0 Not Taking at Unknown time  . carvedilol (COREG) 3.125 MG tablet Take 1 tablet (3.125 mg total) by mouth 2 (two) times daily with a meal. (Patient not taking: Reported on 07/27/2017) 60 tablet 1 Not Taking at Unknown time  . famotidine  (PEPCID) 40 MG/5ML suspension Take 2.5 mLs (20 mg total) by mouth at bedtime. (Patient not taking: Reported on 06/09/2017) 50 mL 0 Not Taking at Unknown time  . ipratropium-albuterol (DUONEB) 0.5-2.5 (3) MG/3ML SOLN Take 3 mLs by nebulization every 6 (six) hours as needed. (Patient not taking: Reported on 06/09/2017) 360 mL 0 Not Taking at Unknown time  . LORazepam (ATIVAN) 1 MG tablet Take 1 tablet (1 mg total) by mouth every 6 (six) hours as needed for anxiety. (Patient not taking: Reported on 06/09/2017) 30 tablet 0 Not Taking at Unknown time  . multivitamin (RENA-VIT) TABS tablet Take 1 tablet by mouth at bedtime. (Patient not taking: Reported on 07/27/2017) 30 tablet 0 Not Taking at Unknown time  . Nutritional Supplements (FEEDING SUPPLEMENT, NEPRO CARB STEADY,) LIQD Take 237 mLs by mouth 2 (two) times daily between meals. (Patient not taking: Reported on 06/09/2017)  0 Not Taking at Unknown time  . traZODone (DESYREL) 50 MG tablet Take 1 tablet (50 mg total) by mouth at bedtime as needed for sleep. (Patient not taking: Reported on 06/09/2017)   Not Taking at Unknown time   Scheduled: . aspirin EC  81 mg Oral QHS  . carvedilol  25 mg Oral BID WC  . collagenase   Topical Daily  . [START ON 08/01/2017] darbepoetin (ARANESP) injection - DIALYSIS  150 mcg Intravenous Q Fri-HD  . docusate sodium  100 mg Oral BID  . doxercalciferol  4 mcg Intravenous Q M,W,F-HD  . ezetimibe  10 mg Oral Q supper  . famotidine  20 mg Oral QHS  . feeding supplement (NEPRO CARB STEADY)  237 mL Oral BID BM  . fenofibrate  160 mg Oral Daily  . finasteride  5 mg Oral Daily  . gabapentin  100 mg Oral QHS  . insulin aspart  0-15 Units Subcutaneous TID WC  . insulin aspart  0-5 Units Subcutaneous QHS  . ipratropium  2 spray Each Nare Daily  . multivitamin  1 tablet Oral QHS  . predniSONE  20 mg Oral Q breakfast  . warfarin  1 mg Oral Once per day on Mon Wed Fri  . [START ON 07/31/2017] warfarin  1.5 mg Oral Once per day on  Sun Tue Thu Sat  . Warfarin - Pharmacist Dosing Inpatient   Does not apply q1800     ROS:  History obtained from the patient  General ROS: negative for - chills, fatigue, fever, night sweats, weight gain or weight loss Psychological ROS: negative for - behavioral disorder, hallucinations, memory difficulties, mood swings or suicidal ideation Ophthalmic ROS: negative for - blurry vision, double vision, eye pain or loss of vision ENT ROS: negative for - epistaxis, nasal discharge, oral lesions, sore throat, tinnitus or vertigo Allergy and Immunology ROS: negative for - hives or itchy/watery eyes Hematological and Lymphatic ROS: negative for - bleeding problems, bruising or swollen lymph nodes Endocrine ROS: negative for - galactorrhea, hair pattern changes, polydipsia/polyuria or temperature intolerance Respiratory ROS: negative for - cough, hemoptysis, shortness of breath or wheezing Cardiovascular ROS: negative for - chest pain, dyspnea on exertion, edema or irregular heartbeat Gastrointestinal ROS: negative for - abdominal pain, diarrhea, hematemesis, nausea/vomiting or stool incontinence Genito-Urinary ROS: negative for - dysuria, hematuria, incontinence or urinary frequency/urgency Musculoskeletal ROS: Positive for - joint swelling or muscular weakness Neurological ROS: as noted in HPI Dermatological ROS: negative for rash and skin lesion changes   Blood pressure (!) 153/57, pulse 92, temperature 98.4 F (36.9 C), temperature source Oral, resp. rate 18, height 5\' 10"  (1.778 m), weight 76 kg (167 lb 8.8 oz), SpO2 97 %.   Neurologic Examination:                                                                                                      HEENT-  Normocephalic, no lesions, without obvious abnormality.  Normal external eye and conjunctiva.  Normal  TM's bilaterally.  Normal auditory canals and external ears. Normal external nose, mucus membranes and septum.  Normal pharynx. Cardiovascular- irregularly irregular rhythm, pulses palpable throughout   Lungs- chest clear, no wheezing, rales, normal symmetric air entry Abdomen- soft, non-tender; bowel sounds normal; no masses,  no organomegaly Extremities- no edema Lymph-no adenopathy palpable Musculoskeletal-no joint tenderness, deformity or swelling Skin-warm and dry, no hyperpigmentation, vitiligo, or suspicious lesions  Neurological Examination Mental Status: Alert, oriented, thought content appropriate.  Speech fluent without evidence of aphasia.  Able to follow 3 step commands without difficulty. Cranial Nerves: II:  Visual fields grossly normal,  III,IV, VI: ptosis not present, extra-ocular motions intact bilaterally pupils equal, round, reactive to light and accommodation V,VII: smile asymmetric on the right, facial light touch sensation normal bilaterally VIII: hearing normal bilaterally IX,X: uvula rises symmetrically XI: bilateral shoulder shrug XII: midline tongue extension Motor: Right : Upper extremity   5/5    Left:     Upper extremity   5/5  Lower extremity   5/5     Lower extremity   5/5 --Left ankle plantarflexion  4/5, left ankle dorsiflexion 4/5, right ankle dorsiflexion 4/5, right ankle plantarflexion 3/5 with a component of pain.  Patient has difficulty everting and inverting his ankle secondary to pain.  Patient does plantar flex he states he has pain in the mid calf region.  --Fasciculations were noted on the lower extremity. --Right calf was not red swollen and I do not palpate any all the way up to the popliteal artery  Sensory:  Pinprick and light touch intact throughout, bilaterally Deep Tendon Reflexes: 2+ and symmetric in the arms, he has 3+ reflexes at bilateral knees with crossed adductors as well as sustained clonus in the right ankle. Cerebellar: normal  finger-to-nose Gait: not tested      Lab Results: Basic Metabolic Panel: Recent Labs  Lab 07/27/17 1608 07/28/17 0750 07/29/17 0427 07/30/17 0815  NA 130* 135 135 131*  K 5.5* 4.7 3.6 4.0  CL 92* 98* 99* 97*  CO2 25 24 27 23   GLUCOSE 406* 58* 119* 92  BUN 69* 77* 30* 61*  CREATININE 8.57* 9.05* 4.71* 6.78*  CALCIUM 9.8 9.3 8.2* 8.7*  PHOS  --   --   --  5.3*    Liver Function Tests: Recent Labs  Lab 07/27/17 1608 07/30/17 0815  AST 20  --   ALT 22  --   ALKPHOS 45  --   BILITOT 0.6  --   PROT 5.8*  --   ALBUMIN 2.9* 2.3*   No results for input(s): LIPASE, AMYLASE in the last 168 hours. No results for input(s): AMMONIA in the last 168 hours.  CBC: Recent Labs  Lab 07/27/17 1608 07/28/17 0750 07/29/17 0427 07/30/17 0815  WBC 13.4* 9.9 8.2 6.9  NEUTROABS 12.2*  --   --   --   HGB 10.3* 9.9* 9.5* 8.8*  HCT 33.0* 30.9* 30.3* 27.9*  MCV 105.1* 103.0* 104.1* 103.3*  PLT 214 150 197 185    Cardiac Enzymes: Recent Labs  Lab 07/27/17 1925  TROPONINI <0.03    Lipid Panel: No results for input(s): CHOL, TRIG, HDL, CHOLHDL, VLDL, LDLCALC in the last 168 hours.  CBG: Recent Labs  Lab 07/29/17 1221 07/29/17 1659 07/29/17 2112 07/30/17 0751 07/30/17 1233  GLUCAP 265* 220* 240* 91 215*    Microbiology: Results for orders placed or performed during the hospital encounter of 07/27/17  Urine Culture     Status: Abnormal   Collection Time: 07/27/17  4:35 PM  Result Value Ref Range Status   Specimen Description URINE, CLEAN CATCH  Final   Special Requests NONE  Final   Culture >=100,000 COLONIES/mL PSEUDOMONAS AERUGINOSA (A)  Final   Report Status 07/30/2017 FINAL  Final   Organism ID, Bacteria PSEUDOMONAS AERUGINOSA (A)  Final      Susceptibility   Pseudomonas aeruginosa - MIC*    CEFTAZIDIME <=1 SENSITIVE Sensitive     CIPROFLOXACIN <=0.25 SENSITIVE Sensitive     GENTAMICIN <=1 SENSITIVE Sensitive     IMIPENEM <=0.25 SENSITIVE Sensitive      PIP/TAZO 8 SENSITIVE Sensitive     CEFEPIME <=1 SENSITIVE Sensitive     * >=100,000 COLONIES/mL PSEUDOMONAS AERUGINOSA  MRSA PCR Screening     Status: None   Collection Time: 07/28/17 12:19 AM  Result Value Ref Range Status   MRSA by PCR NEGATIVE NEGATIVE Final    Comment:        The GeneXpert MRSA Assay (FDA approved for NASAL specimens only), is one component of a comprehensive MRSA colonization surveillance program. It is not intended to diagnose MRSA infection nor to guide or monitor treatment for MRSA infections.   Body fluid culture     Status: None (Preliminary result)   Collection Time: 07/28/17 10:58 AM  Result Value Ref Range Status   Specimen Description SYNOVIAL  Final   Special Requests NONE  Final   Gram Stain   Final    ABUNDANT WBC PRESENT, PREDOMINANTLY PMN RARE GRAM POSITIVE COCCI IN CLUSTERS  Culture FEW STAPHYLOCOCCUS AUREUS  Final   Report Status PENDING  Incomplete   Organism ID, Bacteria STAPHYLOCOCCUS AUREUS  Final      Susceptibility   Staphylococcus aureus - MIC*    CIPROFLOXACIN <=0.5 SENSITIVE Sensitive     ERYTHROMYCIN 0.5 SENSITIVE Sensitive     GENTAMICIN <=0.5 SENSITIVE Sensitive     OXACILLIN 0.5 SENSITIVE Sensitive     TETRACYCLINE 4 SENSITIVE Sensitive     VANCOMYCIN 1 SENSITIVE Sensitive     TRIMETH/SULFA <=10 SENSITIVE Sensitive     CLINDAMYCIN <=0.25 SENSITIVE Sensitive     RIFAMPIN <=0.5 SENSITIVE Sensitive     Inducible Clindamycin NEGATIVE Sensitive     * FEW STAPHYLOCOCCUS AUREUS    Coagulation Studies: Recent Labs    07/27/17 1608 07/28/17 0750 07/29/17 0427 07/30/17 0600  LABPROT 21.3* 24.3* 24.4* 24.3*  INR 1.87 2.21 2.22 2.21    Imaging: No results found.     Assessment and plan per attending neurologist  Etta Quill PA-C [facility] [phone]  07/30/2017, 3:02 PM  On my exam, he has bilateral lower extremity weakness with hyperreflexia.  He also appears to have some fasciculations in  the thighs.  I do not see any fasciculations in the upper extremities or tongue.   Assessment/Plan: 81 year old with progressive lower extremity weakness, difficulty walking.  He has had hyperreflexia at least since July.  He has a history of cervical spine disease, and it is possible that some of these findings are chronic.  With his fasciculations, I am concerned about possible motor neuron disease, and would favor an EMG.  An MRI of his spine would be preferable, but due to his pacemaker this is not possible.  I would favor EMG to assess for denervation, as if this is present then I think motor neuron disease is more likely.   1) CT C-spine T-spine.  2) EMG(will need to be done as outpatient) 3) would consider Myelogram if he continues to worsen and EMG does not reveal denervation.    Roland Rack, MD [facility] [phone]  If 7pm- 7am, please page neurology on call as listed in [place].

## 2017-07-30 NOTE — Progress Notes (Signed)
[facility] Progress Note   Subjective: Seen on HD. Tolerating procedure well. No new C/Os.   Objective Vitals:   07/30/17 0508 07/30/17 0810 07/30/17 0815 07/30/17 0830  BP: (!) 124/59 (!) 143/73 (!) 156/76 (!) 146/74  Pulse: 79 79 75 76  Resp: 18 18 18 17   Temp: 98.3 F (36.8 C) 98.6 F (37 C)    TempSrc: Oral Oral    SpO2: 97% 96%    Weight:  78.4 kg (172 lb 13.5 oz)    Height:       Physical Exam General: Awake, pleasant, NAD Heart: V6,H6, 2/6 systolic M. Regularly irregular Lungs: CTAB A/P Abdomen: Active BS SNT Extremities: No LE edema Dialysis Access: RUA AVF cannulated at present   Additional Objective Labs: Basic Metabolic Panel: Recent Labs  Lab 07/27/17 1608 07/28/17 0750 07/29/17 0427  NA 130* 135 135  K 5.5* 4.7 3.6  CL 92* 98* 99*  CO2 25 24 27   GLUCOSE 406* 58* 119*  BUN 69* 77* 30*  CREATININE 8.57* 9.05* 4.71*  CALCIUM 9.8 9.3 8.2*   Liver Function Tests: Recent Labs  Lab 07/27/17 1608  AST 20  ALT 22  ALKPHOS 45  BILITOT 0.6  PROT 5.8*  ALBUMIN 2.9*   No results for input(s): LIPASE, AMYLASE in the last 168 hours. CBC: Recent Labs  Lab 07/27/17 1608 07/28/17 0750 07/29/17 0427 07/30/17 0815  WBC 13.4* 9.9 8.2 6.9  NEUTROABS 12.2*  --   --   --   HGB 10.3* 9.9* 9.5* 8.8*  HCT 33.0* 30.9* 30.3* 27.9*  MCV 105.1* 103.0* 104.1* 103.3*  PLT 214 150 197 185   Blood Culture    Component Value Date/Time   SDES SYNOVIAL 07/28/2017 1058   SPECREQUEST NONE 07/28/2017 1058   CULT  07/28/2017 1058    FEW STAPHYLOCOCCUS AUREUS SUSCEPTIBILITIES TO FOLLOW    REPTSTATUS PENDING 07/28/2017 1058    Cardiac Enzymes: Recent Labs  Lab 07/27/17 1925  TROPONINI <0.03   CBG: Recent Labs  Lab 07/29/17 0758 07/29/17 1221 07/29/17 1659 07/29/17 2112 07/30/17 0751  GLUCAP 100* 265* 220* 240* 91   Iron Studies: No results for input(s): IRON, TIBC, TRANSFERRIN, FERRITIN in the last 72  hours. @lablastinr3 @ Studies/Results: No results found. Medications: . sodium chloride    . sodium chloride    . ceFEPime (MAXIPIME) IV    . ferric gluconate (FERRLECIT/NULECIT) IV 62.5 mg (07/30/17 0836)  . vancomycin Stopped (07/28/17 2130)   . aspirin EC  81 mg Oral QHS  . carvedilol  25 mg Oral BID WC  . collagenase   Topical Daily  . [START ON 08/01/2017] darbepoetin (ARANESP) injection - DIALYSIS  150 mcg Intravenous Q Fri-HD  . docusate sodium  100 mg Oral BID  . doxercalciferol  4 mcg Intravenous Q M,W,F-HD  . ezetimibe  10 mg Oral Q supper  . famotidine  20 mg Oral QHS  . feeding supplement (NEPRO CARB STEADY)  237 mL Oral BID BM  . fenofibrate  160 mg Oral Daily  . finasteride  5 mg Oral Daily  . gabapentin  100 mg Oral QHS  . insulin aspart  0-15 Units Subcutaneous TID WC  . insulin aspart  0-5 Units Subcutaneous QHS  . ipratropium  2 spray Each Nare Daily  . multivitamin  1 tablet Oral QHS  . predniSONE  20 mg Oral Q breakfast  . Warfarin - Pharmacist Dosing Inpatient   Does not apply q1800   HD orders: MWF HP  4 hrs 180 NRe 400/800 76 kg 2.0 K/2.25 Ca UFP 2 Linear Sodium -No heparin -Venofer 50 mg IV weekly (Recent Fe load X 10 Tsat 19%  Fe 50 07/02/17. Start this dose today) -Mircera 150 mcg IV q 2 weekly (last dose 07/18/17 Last HGB 10.1 07/21/17)   Assessment/Plan: 1. Weakness with H/O myasthenia gravis. Per primary. 2. ESRD -MWF. HD today on schedule. Labs reviewed K+ 4.0. No heparin 3. Anemia - HGB 8.8 Recent ESA dose 07/18/17. May need redosing. Follow HGB. Give Fe with HD today.  4. Secondary hyperparathyroidism - Cont binders/VDRA. Phos 5.3 Ca 8.7 C Ca 10 5. HTN/volume - BP controlled. Cont coreg. Pre wt 78.4 UFG 3.0 Tolerating well.  6. Nutrition - Albumin 2.3. Renal/Carb mod diet. Add prostat/Renal vit.  7. DM: per primary 8  AFib: on Coumadin/Coreg. Rate controlled at present.  9. Pressure ulcers L heel R foot. WOC per primary  Rita H. Brown  NP-C 07/30/2017, 8:56 AM  [facility] [phone]  Pt seen, examined and agree w A/P as above.  Kelly Splinter MD [employer] pager [phone]   07/30/2017, 2:27 PM

## 2017-07-31 ENCOUNTER — Inpatient Hospital Stay (HOSPITAL_COMMUNITY): Payer: Medicare Other

## 2017-07-31 DIAGNOSIS — M79609 Pain in unspecified limb: Secondary | ICD-10-CM

## 2017-07-31 LAB — BODY FLUID CULTURE

## 2017-07-31 LAB — GLUCOSE, CAPILLARY
GLUCOSE-CAPILLARY: 101 mg/dL — AB (ref 65–99)
GLUCOSE-CAPILLARY: 295 mg/dL — AB (ref 65–99)

## 2017-07-31 LAB — PROTIME-INR
INR: 2.33
Prothrombin Time: 25.3 seconds — ABNORMAL HIGH (ref 11.4–15.2)

## 2017-07-31 MED ORDER — COLLAGENASE 250 UNIT/GM EX OINT: TOPICAL_OINTMENT | Freq: Every day | CUTANEOUS | 0 refills | Status: DC

## 2017-07-31 MED ORDER — DEXTROSE 5 % IV SOLN: 2.0000 g | INTRAVENOUS | Status: DC

## 2017-07-31 NOTE — Discharge Summary (Addendum)
Physician Discharge Summary  JACQUE GARRELS HDQ:[id number] DOB: 1935-11-28 DOA: 07/27/2017  PCP: [name], MD  Admit date: 07/27/2017 Discharge date: 07/31/2017   Recommendations for Outpatient Follow-Up:   1. To SNF 2. When following with neuro: EMG if not revealing then myelogram per Dr. 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  Diet recommendation: renal  Wound care: Continue heel foam to the left heel, offload with pillows Add enzymatic debridement ointment to the right lateral malleolus     History of Present Illness:    James David is a 81 y.o. male with medical history significant of CVA in 5/18; pacemaker placement; HTN; HLD; ESRD on MWF HD; DM; afib on Coumadin; and myasthenia gravis presenting with weakness.  He reports that the myasthenia gravis has "really put the locks on me."  He has been unable to walk since shortly after his CVA in 5/18 (CVA 5/16, hospitalized, seemed to be getting better and was sent home but the next day he started falling and was unable to walk and hasn't been able to since). His wife called the [facility] today because he has been so weak the last few days - she had to call EMS to get him out of the floor.  The nurse came to see him and thought he needed to be seen and so she callled 911 because he is so weak.  For the last few days to week, it is hard to get him up into the wheelchair or to get him back into the bed.  Wednesday night was  the first time his wife had to call EMS due to a fall in several weeks.  Since Friday, it has gotten even worse.    Hospitalizations/ER visits in the last 6 months: 5/16-6/2 - right subcortical infarction requiring rehab 6/3 - orthostatic syncope 6/6 - weakness 7/2-11 - acute respiratory failure due to MG crisis/E coli urosepsis 9/24-28 - bleeding from AV fistula with early shock     Hospital Course by Problem:   Weakness -Patient with h/o myasthenia gravis fairly recently diagnosed but with several recent exacerbations  -Presented today with acute on chronic weakness-- appears multifactorial-- wife not able to manage at home, will need SNF -will give next dose of IVIG and see if improvement (due next week) -neuro consult: outpatient EMG  UTI -Despite ESRD, patient does make urine -culture shows pseudomonas -IV abx after HD x 2 weeks (for septic bursitis)  Left elbow pain- appears to be septic bursitis- staph aureus NOT A FRACTURE -He has an effusion in the bursa and appears to be septic bursitis -seen by ortho: s/p tap-- culture with MSSA -IV abx x 2 weeks-- monitor for improvement  Myasthenia gravis -He was unable to tolerate Mestinon due to diarrhea -He was previously treated with IVIG -He has been maintained on 20 mg prednisone daily -gave dose IVIG while in hospital that was due next week  ESRD on MWF HD -Patient on chronicMWFHD -Nephrology  following  DM -A1c was 5.7 in 7/18 -resume home med  Afib on anticoagulation -Rate controlled -Subtherapeutic on Coumadin - will ask pharmacy to dose  Anemia from CKD -Slightly lower than prior -Will trend  Pressure ulcers -These do not appear to be the source of his current illness but also are in need of assistance in managing them -Wound care consult: Continue heel foam to the left heel, offload with pillows Add enzymatic debridement ointment to the right lateral malleolus  HTN -Coreg increased to 25 mg  BID over a month ago       Medical Consultants:    Neuro  Renal  ortho   Discharge Exam:   Vitals:   07/31/17 0433 07/31/17 0905  BP: (!) 147/80 (!) 142/68  Pulse: 84 75  Resp: 18   Temp: 98.6 F (37 C)   SpO2: 98%    Vitals:   07/30/17 2131 07/31/17 0006 07/31/17 0433 07/31/17 0905  BP: (!) 152/64  (!) 147/80 (!) 142/68  Pulse: (!) 101 85 84 75  Resp: 18  18   Temp: 99.1 F (37.3 C)  98.6 F (37 C)   TempSrc: Oral  Oral   SpO2: 97%  98%   Weight:      Height:        Gen:  NAD   The results of significant diagnostics from this hospitalization (including imaging, microbiology, ancillary and laboratory) are listed below for reference.     Procedures and Diagnostic Studies:   Dg Chest 2 View  Result Date: 07/27/2017 CLINICAL DATA:  81 y/o  M; weakness. EXAM: CHEST  2 VIEW COMPARISON:  03/21/2017 CT head FINDINGS: Stable cardiomegaly. Aortic atherosclerosis with calcification. Clear lungs. No pleural effusion or pneumothorax. No acute osseous abnormality is evident. Two lead pacemaker noted. Surgical clips project over left axilla. Right axillary vascular stent. IMPRESSION: Stable cardiomegaly.  No acute pulmonary process identified. Electronically Signed   By: Kristine Garbe M.D.   On: 07/27/2017 17:37   Dg Elbow Complete Left  Result Date: 07/27/2017 CLINICAL DATA:  81 y/o  M; elbow pain. EXAM: LEFT ELBOW - COMPLETE 3+ VIEW COMPARISON:  None. FINDINGS: Lucency in radial head on a single oblique view and elevated anterior fat pad, question radial head fracture. Calcification adjacent to the lateral epicondyles compatible with lateral epicondylitis. Vascular calcifications. No joint dislocation. IMPRESSION: Lucency in radial head on single view and elevated anterior fat pad, question radial head fracture. Electronically Signed   By: Kristine Garbe M.D.   On: 07/27/2017 17:26     Labs:   Basic Metabolic Panel: Recent Labs  Lab  07/27/17 1608 07/28/17 0750 07/29/17 0427 07/30/17 0815  NA 130* 135 135 131*  K 5.5* 4.7 3.6 4.0  CL 92* 98* 99* 97*  CO2 25 24 27 23   GLUCOSE 406* 58* 119* 92  BUN 69* 77* 30* 61*  CREATININE 8.57* 9.05* 4.71* 6.78*  CALCIUM 9.8 9.3 8.2* 8.7*  PHOS  --   --   --  5.3*   GFR Estimated Creatinine Clearance: 9.1 mL/min (A) (by C-G formula based on SCr of 6.78 mg/dL (H)). Liver Function Tests: Recent Labs  Lab 07/27/17 1608 07/30/17 0815  AST 20  --   ALT 22  --   ALKPHOS 45  --   BILITOT 0.6  --   PROT 5.8*  --   ALBUMIN 2.9* 2.3*   No results for input(s): LIPASE, AMYLASE in the last 168 hours. No results for input(s): AMMONIA  in the last 168 hours. Coagulation profile Recent Labs  Lab 07/27/17 1608 07/28/17 0750 07/29/17 0427 07/30/17 0600 07/31/17 0419  INR 1.87 2.21 2.22 2.21 2.33    CBC: Recent Labs  Lab 07/27/17 1608 07/28/17 0750 07/29/17 0427 07/30/17 0815  WBC 13.4* 9.9 8.2 6.9  NEUTROABS 12.2*  --   --   --   HGB 10.3* 9.9* 9.5* 8.8*  HCT 33.0* 30.9* 30.3* 27.9*  MCV 105.1* 103.0* 104.1* 103.3*  PLT 214 150 197 185   Cardiac Enzymes: Recent Labs  Lab 07/27/17 1925  TROPONINI <0.03   BNP: Invalid input(s): POCBNP CBG: Recent Labs  Lab 07/30/17 1656 07/30/17 2013 07/30/17 2231 07/31/17 0752 07/31/17 1210  GLUCAP 349* 430* 295* 101* 295*   D-Dimer No results for input(s): DDIMER in the last 72 hours. Hgb A1c No results for input(s): HGBA1C in the last 72 hours. Lipid Profile No results for input(s): CHOL, HDL, LDLCALC, TRIG, CHOLHDL, LDLDIRECT in the last 72 hours. Thyroid function studies No results for input(s): TSH, T4TOTAL, T3FREE, THYROIDAB in the last 72 hours.  Invalid input(s): FREET3 Anemia work up No results for input(s): VITAMINB12, FOLATE, FERRITIN, TIBC, IRON, RETICCTPCT in the last 72 hours. Microbiology Recent Results (from the past 240 hour(s))  Urine Culture     Status: Abnormal   Collection Time: 07/27/17   4:35 PM  Result Value Ref Range Status   Specimen Description URINE, CLEAN CATCH  Final   Special Requests NONE  Final   Culture >=100,000 COLONIES/mL PSEUDOMONAS AERUGINOSA (A)  Final   Report Status 07/30/2017 FINAL  Final   Organism ID, Bacteria PSEUDOMONAS AERUGINOSA (A)  Final      Susceptibility   Pseudomonas aeruginosa - MIC*    CEFTAZIDIME <=1 SENSITIVE Sensitive     CIPROFLOXACIN <=0.25 SENSITIVE Sensitive     GENTAMICIN <=1 SENSITIVE Sensitive     IMIPENEM <=0.25 SENSITIVE Sensitive     PIP/TAZO 8 SENSITIVE Sensitive     CEFEPIME <=1 SENSITIVE Sensitive     * >=100,000 COLONIES/mL PSEUDOMONAS AERUGINOSA  MRSA PCR Screening     Status: None   Collection Time: 07/28/17 12:19 AM  Result Value Ref Range Status   MRSA by PCR NEGATIVE NEGATIVE Final    Comment:        The GeneXpert MRSA Assay (FDA approved for NASAL specimens only), is one component of a comprehensive MRSA colonization surveillance program. It is not intended to diagnose MRSA infection nor to guide or monitor treatment for MRSA infections.   Body fluid culture     Status: None   Collection Time: 07/28/17 10:58 AM  Result Value Ref Range Status   Specimen Description SYNOVIAL  Final   Special Requests NONE  Final   Gram Stain   Final    ABUNDANT WBC PRESENT, PREDOMINANTLY PMN RARE GRAM POSITIVE COCCI IN CLUSTERS    Culture FEW STAPHYLOCOCCUS AUREUS  Final   Report Status 07/31/2017 FINAL  Final   Organism ID, Bacteria STAPHYLOCOCCUS AUREUS  Final      Susceptibility   Staphylococcus aureus - MIC*    CIPROFLOXACIN <=0.5 SENSITIVE Sensitive     ERYTHROMYCIN 0.5 SENSITIVE Sensitive     GENTAMICIN <=0.5 SENSITIVE Sensitive     OXACILLIN 0.5 SENSITIVE Sensitive     TETRACYCLINE 4 SENSITIVE Sensitive     VANCOMYCIN 1 SENSITIVE Sensitive     TRIMETH/SULFA <=10 SENSITIVE Sensitive     CLINDAMYCIN <=0.25 SENSITIVE Sensitive     RIFAMPIN <=0.5 SENSITIVE Sensitive  Inducible Clindamycin NEGATIVE  Sensitive     * FEW STAPHYLOCOCCUS AUREUS     Discharge Instructions:   Discharge Instructions    Discharge instructions   Complete by:  As directed    Renal diet   Increase activity slowly   Complete by:  As directed      Allergies as of 07/31/2017      Reactions   Amlodipine Besylate Swelling   Angioedema   Lisinopril Other (See Comments)   Renal insufficiency   Statins Other (See Comments)   Muscle aches   Ropinirole Other (See Comments)   Causes excessive grogginess the following morning after taking   Tape Other (See Comments)   SKIN IS THIN AND TAPE WILL ACTUALLY TEAR IT; PLEASE USE COBAN WRAP INSTEAD!!      Medication List    STOP taking these medications   diphenhydrAMINE 25 MG tablet Commonly known as:  BENADRYL   fenofibrate 160 MG tablet   LORazepam 1 MG tablet Commonly known as:  ATIVAN     TAKE these medications   acetaminophen 500 MG tablet Commonly known as:  TYLENOL Take 500 mg by mouth every 8 (eight) hours as needed for mild pain. (pain/headaches)   aspirin EC 81 MG tablet Take 81 mg by mouth at bedtime.   calcium acetate 667 MG capsule Commonly known as:  PHOSLO Take 667-2,001 mg See admin instructions by mouth. Take 3 capsules (2001 mg) by mouth three times daily with meals, take 1 capsule (667 mg) with snacks   carvedilol 25 MG tablet Commonly known as:  COREG Take 25 mg 2 (two) times daily by mouth. What changed:  Another medication with the same name was removed. Continue taking this medication, and follow the directions you see here.   ceFEPIme 2 g in dextrose 5 % 50 mL Inject 2 g every Monday, Wednesday, and Friday at 6 PM into the vein. Start taking on:  08/01/2017   collagenase ointment Commonly known as:  SANTYL Apply daily topically. Start taking on:  08/01/2017   ezetimibe 10 MG tablet Commonly known as:  ZETIA Take 1 tablet (10 mg total) by mouth daily.   famotidine 40 MG/5ML suspension Commonly known as:   PEPCID Take 2.5 mLs (20 mg total) by mouth at bedtime.   feeding supplement (NEPRO CARB STEADY) Liqd Take 237 mLs by mouth 2 (two) times daily between meals. What changed:  Another medication with the same name was removed. Continue taking this medication, and follow the directions you see here.   feeding supplement (PRO-STAT SUGAR FREE 64) Liqd Take 30 mLs by mouth 2 (two) times daily.   finasteride 5 MG tablet Commonly known as:  PROSCAR Take 1 tablet (5 mg total) by mouth daily.   folic acid-vitamin b complex-vitamin c-selenium-zinc 3 MG Tabs tablet Take 1 tablet at bedtime by mouth.   gabapentin 100 MG capsule Commonly known as:  NEURONTIN Take 1 capsule (100 mg total) by mouth at bedtime.   glimepiride 1 MG tablet Commonly known as:  AMARYL Take 1 tablet (1 mg total) by mouth daily with breakfast.   ipratropium-albuterol 0.5-2.5 (3) MG/3ML Soln Commonly known as:  DUONEB Take 3 mLs by nebulization every 6 (six) hours as needed.   Melatonin 3 MG Tabs Take 3 mg at bedtime by mouth.   multivitamin Tabs tablet Take 1 tablet by mouth at bedtime.   predniSONE 20 MG tablet Commonly known as:  DELTASONE Take 1 tablet (20 mg total) by mouth daily  with breakfast. Please note to continue taking 20 mg tablet for now  and you must be evaluated by your neurologist (Dr. [name]) to determine next Prednisone dose and frequency.   traZODone 50 MG tablet Commonly known as:  DESYREL Take 1 tablet (50 mg total) by mouth at bedtime as needed for sleep.   warfarin 1 MG tablet Commonly known as:  COUMADIN 1 1/2 tab daily What changed:    how much to take  how to take this  when to take this  additional instructions      Follow-up Information    [name], MD Follow up in 1 week(s).   Specialty:  Family Medicine Contact information: [address] [place] [address] [phone]            Time coordinating discharge: 35 min  Signed:  Ayaan Ringle Alison Stalling   [facility] 07/31/2017, 1:18 PM

## 2017-07-31 NOTE — Progress Notes (Signed)
VASCULAR LAB PRELIMINARY  PRELIMINARY  PRELIMINARY  PRELIMINARY  Right lower extremity venous duplex completed.    Preliminary report:  There is no DVT or SVT noted in the right lower extremity.   Raeleen Winstanley, RVT 07/31/2017, 9:57 AM

## 2017-07-31 NOTE — Progress Notes (Signed)
Patient will discharge to [facility] Anticipated discharge date: 11/15 Family notified: pt wife Transportation by [employer]- scheduled for 3pm  CSW signing off.  Jorge Ny, LCSW Clinical Social Worker [phone]

## 2017-07-31 NOTE — Progress Notes (Signed)
Report was given to [place]

## 2017-07-31 NOTE — Progress Notes (Signed)
Patient is unchanged, at this point I think that the best next step would be a EMG, if this does not give further guidance,  then would consider CT myelogram.  Is been a slow progressive process, and I would favor starting off with a less invasive test first.  He will need to follow-up with his outpatient neurologist.  Roland Rack, MD [facility] [phone]  If 7pm- 7am, please page neurology on call as listed in [place].

## 2017-07-31 NOTE — Progress Notes (Signed)
Patient ID: James David, male   DOB: 04/30/1936, 81 y.o.   MRN: [id number]   LOS: 2 days   Subjective: Elbow still hurting but improved.   Objective: Vital signs in last 24 hours: Temp:  [97.8 F (36.6 C)-99.1 F (37.3 C)] 98.6 F (37 C) (11/15 0433) Pulse Rate:  [75-101] 75 (11/15 0905) Resp:  [15-18] 18 (11/15 0433) BP: (132-158)/(53-80) 142/68 (11/15 0905) SpO2:  [97 %-98 %] 98 % (11/15 0433) Weight:  [76 kg (167 lb 8.8 oz)] 76 kg (167 lb 8.8 oz) (11/14 1146) Last BM Date: 07/29/17   Laboratory  CBC Recent Labs    07/29/17 0427 07/30/17 0815  WBC 8.2 6.9  HGB 9.5* 8.8*  HCT 30.3* 27.9*  PLT 197 185   BMET Recent Labs    07/29/17 0427 07/30/17 0815  NA 135 131*  K 3.6 4.0  CL 99* 97*  CO2 27 23  GLUCOSE 119* 92  BUN 30* 61*  CREATININE 4.71* 6.78*  CALCIUM 8.2* 8.7*     Physical Exam General appearance: alert and no distress  LUE: ROM improved to 175-50. Mild TTP bursa and proximally.   Assessment/Plan: Septic bursitis -- MSSA grew from aspirate, would narrow abx and transition to oral, will leave to primary service.     Lisette Abu, PA-C Orthopedic Surgery [phone] 07/31/2017

## 2017-07-31 NOTE — Progress Notes (Signed)
Physical Therapy Treatment Patient Details Name: James David MRN: [id number] DOB: [birth year] Today's Date: 07/31/2017    History of Present Illness James David a 81 y.o.malewith medical history significant ofCVA in 5/18; pacemaker placement; HTN; HLD; ESRD on MWF HD; DM; afib on Coumadin; and myasthenia gravis presenting with weakness, possibly MG exacerbation;He reports that the myasthenia gravis has "really put the locks on me." Pt with 5 hospital admissions since 01/2017.; doppler RLE negative for DVT     PT Comments    Pt progressing slowly; he is motivated and easily agrees to PT; continue to recommend SNF post acute as pt requires grossly mod to max assist with bed mobility and transfers; pt returned to bed end of session per RN request for transport to vascular lab;  Follow Up Recommendations  SNF;Supervision/Assistance - 24 hour     Equipment Recommendations  None recommended by PT    Recommendations for Other Services       Precautions / Restrictions Precautions Precautions: Fall Precaution Comments:  L painful elbow Restrictions Weight Bearing Restrictions: No    Mobility  Bed Mobility Overal bed mobility: Needs Assistance Bed Mobility: Rolling;Sidelying to Sit;Sit to Supine Rolling: Min assist;Mod assist Sidelying to sit: Mod assist;HOB elevated(30*)   Sit to supine: Mod assist   General bed mobility comments: min to  Mod A to roll for bed pad positioning; Mod A to elevate trunk from sidelying to sit, min assist to scoot hips forward; assist to control descent of trunk and to bring bil LEs onto bed  Transfers Overall transfer level: Needs assistance Equipment used: Rolling walker (2 wheeled) Transfers: Sit to/from Stand Sit to Stand: +2 physical assistance;Mod assist         General transfer comment: attempted sit to stand with RW, pt unable; sit<>stand with +2 mod assist to rise from elevated surface with  PT/tech blocking bil knees and  assisting with anterior superior wt shift;  multi-modal cues for pt effort, trunk/hip extension  Ambulation/Gait             General Gait Details: pt non-ambulatory at baseline   Stairs            Wheelchair Mobility    Modified Rankin (Stroke Patients Only)       Balance   Sitting-balance support: Bilateral upper extremity supported;No upper extremity supported;Feet supported;Single extremity supported Sitting balance-Leahy Scale: Poor Sitting balance - Comments: pt requires multi-modal cues and assist to maintain midline in static sitting;  requires incr assist for dynamic activity; repeated posterior LOB Postural control: Posterior lean Standing balance support: Bilateral upper extremity supported Standing balance-Leahy Scale: Zero Standing balance comment: pt reliant on UE support and physical assist; unable to maintain standing today without extensive assist                            Cognition Arousal/Alertness: Awake/alert Behavior During Therapy: WFL for tasks assessed/performed Overall Cognitive Status: Within Functional Limits for tasks assessed                                        Exercises      General Comments        Pertinent Vitals/Pain Pain Assessment: 0-10 Pain Score: 4  Pain Location: L elbow Pain Descriptors / Indicators: Discomfort;Grimacing Pain Intervention(s): Monitored during session    Home Living  Prior Function            PT Goals (current goals can now be found in the care plan section) Acute Rehab PT Goals PT Goal Formulation: With patient Time For Goal Achievement: 08/11/17 Potential to Achieve Goals: Fair Progress towards PT goals: Progressing toward goals(slowly)    Frequency    Min 3X/week      PT Plan Discharge plan needs to be updated    Co-evaluation              AM-PAC PT "6 Clicks" Daily Activity  Outcome Measure  Difficulty  turning over in bed (including adjusting bedclothes, sheets and blankets)?: Unable Difficulty moving from lying on back to sitting on the side of the bed? : Unable Difficulty sitting down on and standing up from a chair with arms (e.g., wheelchair, bedside commode, etc,.)?: Unable Help needed moving to and from a bed to chair (including a wheelchair)?: A Lot Help needed walking in hospital room?: Total Help needed climbing 3-5 steps with a railing? : Total 6 Click Score: 7    End of Session Equipment Utilized During Treatment: Gait belt Activity Tolerance: Patient tolerated treatment well Patient left: in bed;with call bell/phone within reach;with bed alarm set Nurse Communication: Mobility status PT Visit Diagnosis: Unsteadiness on feet (R26.81);Repeated falls (R29.6);Muscle weakness (generalized) (M62.81);Difficulty in walking, not elsewhere classified (R26.2)     Time: 2426-8341 PT Time Calculation (min) (ACUTE ONLY): 19 min  Charges:  $Therapeutic Activity: 8-22 mins                    G CodesKenyon Ana, PT Pager: [phone] 07/31/2017    [facility] 07/31/2017, 11:31 AM

## 2017-07-31 NOTE — Progress Notes (Signed)
[name] to be D/C'd Rehab per MD order.  Discussed with the patient and all questions fully answered.  VSS, Skin clean, dry and intact without evidence of skin break down, no evidence of skin tears noted. IV catheter discontinued intact. Site without signs and symptoms of complications. Dressing and pressure applied.  An After Visit Summary was printed and given to the patient. Patient received prescription.  D/c education completed with patient/family including follow up instructions, medication list, d/c activities limitations if indicated, with other d/c instructions as indicated by MD - patient able to verbalize understanding, all questions fully answered.   Patient instructed to return to ED, call 911, or call MD for any changes in condition.   Patient escorted via stretcher, and D/C rehab via EMS.  Holley Raring 07/31/2017 2:50 PM

## 2017-07-31 NOTE — Progress Notes (Signed)
[place] for Coumadin Indication: atrial fibrillation  Allergies  Allergen Reactions  . Amlodipine Besylate Swelling    Angioedema   . Lisinopril Other (See Comments)    Renal insufficiency  . Statins Other (See Comments)    Muscle aches  . Ropinirole Other (See Comments)    Causes excessive grogginess the following morning after taking  . Tape Other (See Comments)    SKIN IS THIN AND TAPE WILL ACTUALLY TEAR IT; PLEASE USE COBAN WRAP INSTEAD!!    Patient Measurements: Height: 5\' 10"  (177.8 cm) Weight: 167 lb 8.8 oz (76 kg) IBW/kg (Calculated) : 73  Vital Signs: Temp: 98.6 F (37 C) (11/15 0433) Temp Source: Oral (11/15 0433) BP: 142/68 (11/15 0905) Pulse Rate: 75 (11/15 0905)  Labs: Recent Labs    07/29/17 0427 07/30/17 0600 07/30/17 0815 07/31/17 0419  HGB 9.5*  --  8.8*  --   HCT 30.3*  --  27.9*  --   PLT 197  --  185  --   LABPROT 24.4* 24.3*  --  25.3*  INR 2.22 2.21  --  2.33  CREATININE 4.71*  --  6.78*  --     Estimated Creatinine Clearance: 9.1 mL/min (A) (by C-G formula based on SCr of 6.78 mg/dL (H)).  Assessment: 81 y.o. male admitted with weakness/UTI, h/o Afib, to continue Coumadin. INR remains therapeutic at 2.3 and CBC stable. On Aranesp/iron for anemia of ESRD.  Goal of Therapy:  INR 2-3 Monitor platelets by anticoagulation protocol: Yes   Plan:  Home warfarin dose- 1mg  MWF and 1.5mg  TTSS Daily INR for now  Vincenza Hews, PharmD, BCPS 07/31/2017, 10:27 AM

## 2017-07-31 NOTE — Clinical Social Work Placement (Signed)
   CLINICAL SOCIAL WORK PLACEMENT  NOTE  Date:  07/31/2017  Patient Details  Name: James David MRN: [id number] Date of Birth: 1935/12/26  Clinical Social Work is seeking post-discharge placement for this patient at the [place] level of care (*CSW will initial, date and re-position this form in  chart as items are completed):  Yes   Patient/family provided with [facility] Work Department's list of facilities offering this level of care within the geographic area requested by the patient (or if unable, by the patient's family).  Yes   Patient/family informed of their freedom to choose among providers that offer the needed level of care, that participate in Medicare, Medicaid or managed care program needed by the patient, have an available bed and are willing to accept the patient.  Yes   Patient/family informed of [facility]'s ownership interest in [facility] and [facility], as well as of the fact that they are under no obligation to receive care at these facilities.  PASRR submitted to EDS on       PASRR number received on       Existing PASRR number confirmed on 07/30/17     FL2 transmitted to all facilities in geographic area requested by pt/family on 07/31/17     FL2 transmitted to all facilities within larger geographic area on       Patient informed that his/her managed care company has contracts with or will negotiate with certain facilities, including the following:        Yes   Patient/family informed of bed offers received.  Patient chooses bed at [facility]     Physician recommends and patient chooses bed at      Patient to be transferred to [facility] [facility] on 07/31/17.  Patient to be transferred to facility by ptar     Patient family notified on 07/31/17 of transfer.  Name of family member notified:  [name]     PHYSICIAN Please sign FL2     Additional Comment:     _______________________________________________ Jorge Ny, LCSW 07/31/2017, 1:00 PM

## 2017-07-31 NOTE — Clinical Social Work Note (Signed)
Clinical Social Work Assessment  Patient Details  Name: James David MRN: [id number] Date of Birth: [birth year]  Date of referral:  07/31/17               Reason for consult:  Facility Placement                Permission sought to share information with:  Family Supports, Chartered certified accountant granted to share information::  Yes, Verbal Permission Granted  Name::     Building surveyor::  SNF  Relationship::  wife  Contact Information:     Housing/Transportation Living arrangements for the past 2 months:  [place] of Information:  Patient, Spouse Patient Interpreter Needed:  None Criminal Activity/Legal Involvement Pertinent to Current Situation/Hospitalization:  No - Comment as needed Significant Relationships:  Spouse Lives with:  Spouse Do you feel safe going back to the place where you live?  No Need for family participation in patient care:  Yes (Comment)(wife helps with ADLs at home)  Care giving concerns:  Pt lives at home with wife- has been wheelchair bound at home but usually able to to assist with mobility- has become much weaker and wife has been unable to care for him at home.   Social Worker assessment / plan:  CSW explained SNF and SNF referral process- pt has been to SNF recently ([facility] in September).  CSW discussed Medicare rules and likelihood that pt only has something like 35 SNF days remaining- pt and wife expressed understanding.  Employment status:  Retired Nurse, adult PT Recommendations:  [place] / Referral to community resources:  [place]  Patient/Family's Response to care:  Pt and wife agreeable to SNF placement and hopeful for a place near their home in [place].  Patient/Family's Understanding of and Emotional Response to Diagnosis, Current Treatment, and Prognosis:  Pt and spouse seem to have good understanding of current condition- hopeful  that pt will only need rehab for short period of time and then will be safe to return home.  Emotional Assessment Appearance:  Appears stated age Attitude/Demeanor/Rapport:    Affect (typically observed):  Appropriate Orientation:  Oriented to Situation, Oriented to  Time, Oriented to Place, Oriented to Self Alcohol / Substance use:  Not Applicable Psych involvement (Current and /or in the community):  No (Comment)  Discharge Needs  Concerns to be addressed:  Care Coordination Readmission within the last 30 days:  No Current discharge risk:  Physical Impairment Barriers to Discharge:  No Barriers Identified   Jorge Ny, LCSW 07/31/2017, 2:23 PM

## 2017-07-31 NOTE — Progress Notes (Addendum)
[facility] Progress Note   Subjective:  NO C/O.  Objective Vitals:   07/30/17 2131 07/31/17 0006 07/31/17 0433 07/31/17 0905  BP: (!) 152/64  (!) 147/80 (!) 142/68  Pulse: (!) 101 85 84 75  Resp: 18  18   Temp: 99.1 F (37.3 C)  98.6 F (37 C)   TempSrc: Oral  Oral   SpO2: 97%  98%   Weight:      Height:       Physical Exam General: Awake, pleasant, NAD Heart: N5,A2, 2/6 systolic M. Regularly irregular Lungs: CTAB A/P Abdomen: Active BS SNT Extremities: No LE edema Dialysis Access: RUA AVF    Additional Objective Labs: Basic Metabolic Panel: Recent Labs  Lab 07/28/17 0750 07/29/17 0427 07/30/17 0815  NA 135 135 131*  K 4.7 3.6 4.0  CL 98* 99* 97*  CO2 24 27 23   GLUCOSE 58* 119* 92  BUN 77* 30* 61*  CREATININE 9.05* 4.71* 6.78*  CALCIUM 9.3 8.2* 8.7*  PHOS  --   --  5.3*   Liver Function Tests: Recent Labs  Lab 07/27/17 1608 07/30/17 0815  AST 20  --   ALT 22  --   ALKPHOS 45  --   BILITOT 0.6  --   PROT 5.8*  --   ALBUMIN 2.9* 2.3*   No results for input(s): LIPASE, AMYLASE in the last 168 hours. CBC: Recent Labs  Lab 07/27/17 1608 07/28/17 0750 07/29/17 0427 07/30/17 0815  WBC 13.4* 9.9 8.2 6.9  NEUTROABS 12.2*  --   --   --   HGB 10.3* 9.9* 9.5* 8.8*  HCT 33.0* 30.9* 30.3* 27.9*  MCV 105.1* 103.0* 104.1* 103.3*  PLT 214 150 197 185   Blood Culture    Component Value Date/Time   SDES SYNOVIAL 07/28/2017 1058   SPECREQUEST NONE 07/28/2017 1058   CULT FEW STAPHYLOCOCCUS AUREUS 07/28/2017 1058   REPTSTATUS 07/31/2017 FINAL 07/28/2017 1058    Cardiac Enzymes: Recent Labs  Lab 07/27/17 1925  TROPONINI <0.03   CBG: Recent Labs  Lab 07/30/17 1656 07/30/17 2013 07/30/17 2231 07/31/17 0752 07/31/17 1210  GLUCAP 349* 430* 295* 101* 295*   Iron Studies: No results for input(s): IRON, TIBC, TRANSFERRIN, FERRITIN in the last 72 hours. @lablastinr3 @ Studies/Results:  Medications: . ceFEPime (MAXIPIME) IV  Stopped (07/30/17 2044)  . ferric gluconate (FERRLECIT/NULECIT) IV Stopped (07/30/17 0936)   . aspirin EC  81 mg Oral QHS  . carvedilol  25 mg Oral BID WC  . collagenase   Topical Daily  . [START ON 08/01/2017] darbepoetin (ARANESP) injection - DIALYSIS  150 mcg Intravenous Q Fri-HD  . docusate sodium  100 mg Oral BID  . doxercalciferol  4 mcg Intravenous Q M,W,F-HD  . ezetimibe  10 mg Oral Q supper  . famotidine  20 mg Oral QHS  . feeding supplement (NEPRO CARB STEADY)  237 mL Oral BID BM  . fenofibrate  160 mg Oral Daily  . finasteride  5 mg Oral Daily  . gabapentin  100 mg Oral QHS  . insulin aspart  0-15 Units Subcutaneous TID WC  . insulin aspart  0-5 Units Subcutaneous QHS  . ipratropium  2 spray Each Nare Daily  . multivitamin  1 tablet Oral QHS  . predniSONE  20 mg Oral Q breakfast  . warfarin  1 mg Oral Once per day on Mon Wed Fri  . warfarin  1.5 mg Oral Once per day on Sun Tue Thu Sat  . Warfarin -  Pharmacist Dosing Inpatient   Does not apply q1800   HD orders: MWF HP 4h   76kg  2/2.25  Hep none  RUA AVF -Venofer 50 mg IV weekly (Recent Fe load X 10 Tsat 19%  Fe 50 07/02/17. Start this dose today) -Mircera 150 mcg IV q 2 weekly (last dose 07/18/17 Last HGB 10.1 07/21/17)   Assessment/Plan: 1. Weakness / H/O myasthenia gravis.  2. ESRD -MWF. HD Friday on schedule.  3. Anemia - HGB 8.8 Recent ESA dose 07/18/17. May need redosing. Follow HGB. Give Fe with HD today.  4. UTI pseudomonas 5. Septic bursitis +culture for pseudomonas as well 6. HTN/volume - BP controlled. Cont coreg. At dry wt today 7. Nutrition - Albumin 2.3. Renal/Carb mod diet. Add prostat/Renal vit.  8. DM: per primary 9  AFib: on Coumadin/Coreg. Rate controlled at present.  10. Pressure ulcers L heel R foot. WOC per primary  Plan - going home today, will get IV Fortaz with HD thru 11/26 for #4 and #5 above     Kelly Splinter MD [place] pager [phone]   07/31/2017, 1:04  PM

## 2017-08-12 ENCOUNTER — Encounter: Payer: Self-pay | Admitting: Neurology

## 2017-08-12 ENCOUNTER — Other Ambulatory Visit: Payer: Self-pay

## 2017-08-12 ENCOUNTER — Ambulatory Visit (INDEPENDENT_AMBULATORY_CARE_PROVIDER_SITE_OTHER): Payer: Medicare Other | Admitting: Neurology

## 2017-08-12 VITALS — BP 135/74 | HR 79 | Ht 70.0 in | Wt 177.0 lb

## 2017-08-12 DIAGNOSIS — G7000 Myasthenia gravis without (acute) exacerbation: Secondary | ICD-10-CM | POA: Diagnosis not present

## 2017-08-12 DIAGNOSIS — G7001 Myasthenia gravis with (acute) exacerbation: Secondary | ICD-10-CM

## 2017-08-12 DIAGNOSIS — Z950 Presence of cardiac pacemaker: Secondary | ICD-10-CM

## 2017-08-12 DIAGNOSIS — R531 Weakness: Secondary | ICD-10-CM | POA: Diagnosis not present

## 2017-08-12 DIAGNOSIS — M4802 Spinal stenosis, cervical region: Secondary | ICD-10-CM | POA: Insufficient documentation

## 2017-08-12 DIAGNOSIS — I639 Cerebral infarction, unspecified: Secondary | ICD-10-CM

## 2017-08-12 NOTE — Progress Notes (Signed)
[facility]  PATIENT: James David DOB: [birth year]     HISTORICAL  CHIEF COMPLAINT:  Chief Complaint  Patient presents with  . Myasthenia Gravis    Last IVIG infusions were 2 wks ago. Sts. he feels he is doing better, anxious to leave rehab and get back home.  Not able to walk--using a w/c/fim    HISTORY James David is a 81 year old man with myasthenia gravis (positive antibodies) who had a small stroke with mild left-sided weakness earlier in 2018.   Update 08/12/2017: He feels stronger since the second IVIg but still is weaker than he was before his last hospitalization.Marland Kitchen    He is walking about 30 feet with a walker.    He is in Rehab at [facility].    He feels most of the weakness is in his legs.  He has noted some muscle wasting in the legs. A little bit of muscle twitching but this does not occur regularly.  Unable to control bladder since May.    I reviewed the CT scans of the cervical, thoracic and lumbar spine. He does appear to have significant spinal stenosis at C6-C7. From a CT scan, it is impossible to know how severe the spinal stenosis is unfortunately he cannot get MRIs due to a pacemaker. The MRI of the lumbar spondylosis shows multilevel degenerative changes   From 05/08/2017: He was recently hospitalized (03/19/2017)  for respiratory failure and gram-negative sepsis.   The myasthenia gravis was likely playing a role in the respiratory failure.  to be due to the myasthenia gravis..  I reviewed the CT scan of the head showing moderately severe chronic vascular ischemic changes, chronic left caudate and bilateral internal capsules lacunar infarctions and moderate atrophy.  He received 2 g IVIg and was started on prednisone.   He is still on prednisone 20 mg and he tolerates it well.     He feels stronger than last month and canuse a walker to go close to 50 feet. He can walk with a cane. Feels much less fatigue that he did several months ago.     He  feels less sleepy.      He also has chronic renal insufficiency.   His renal doctor is [name]) and he was seeing Dr. Justin Mend.   Dialysis was started recently.  We discussed that his current difficulties with gait are likely a combination of the myasthenia gravis, history of stroke, history of other chronic ischemic changes noted on the imaging studies, general health with multiple chronic illnesses.   CTA 02/04/2017 was personally reviewed and I concur with Impression below: IMPRESSION: 1. Severely depressed flow in the right V4 segment likely due to stenosis at the dura. Small basilar in the setting of fetal type left PCA, with superimposed moderate to advanced mid basilar stenosis. 2. Bilateral intracranial ICA stenosis that is moderate to advanced. 3. Advanced diffuse atherosclerotic irregularity of medium size intracranial vessels. 4. Severe right and moderate left vertebral origin stenosis. 5. No flow limiting stenosis in the atherosclerotic cervical carotid circulation. 6. 14 mm mass in the right tracheoesophageal groove with features of parathyroid adenoma. Correlate with this calcium labs. 7. Bilateral thyroid nodules measuring up to 25 mm on the right  From Dr. Phoebe Sharps hospital note 01/31/17: HISTORY OF PRESENT ILLNESS (per record) This is a 19-yo RH man who presented to the ED after he had an episode of L arm numbness and tingling yesterday 01/29/2017.  He was at [facility] when he noted that his left arm felt numb. This was preceded by sweating and dizziness. He states the has also had some slurred speech. His numbness resolved after about 15 minutes but he feels like his speech remains slightly more slurred than usual. He denies any facial droop or symptoms in the LLE. In the ED he had a CTH which showed no acute abnormality. His pacemaker was interrogated and showed no arryhythmia or other cardiac irregularity that correlated with his  symptoms. He was admitted for further evaluation.   He states that last night he was trying to walk to the bathroom in his room but had difficulty because his left leg seemed to give out from under him. He does not feel any weakness in his leg right now.   Of note, the patient started hemodialysis on 5/15. He has a pacemaker in place due to a diagnosis of second degree heart block. He has a known h/o atrial fibrillation but is not anticoagulated. He denies any prior history of stroke.   Patient was not administered IV t-PA secondary to delay in arrival. He was admitted for further evaluation and treatment.   SUBJECTIVE (INTERVAL HISTORY) Pt lying in bed and wife is at bedside. Pt and wife recounted HPI with me. He was in restaurant and had sudden onset left arm numbness and tingling, at the same time, he had slurry speech and felt dizzy and nauseous, not able to stand up. Called EMS, and on their arrival, BP 130s. The numbness and tingling lasted about 9min but slurry speech continued today. Wife felt his speech still not baseline but pt felt pretty good about himself. CT negative x 2. He has pacemaker due to AVB II but also found to have Aflutter. Not on AC. He is taking ASA 81mg  at home. Recently started HD for CKD, had some problem with fistular and thrombosis, following with Dr. Posey Pronto at [facility] and vascular center.    ROS:  Out of a complete 14 system review of symptoms, the patient complains only of the following symptoms, and all other reviewed systems are negative.  As above and he has fatigue. He has lower back pain. Mild neck pain.   ALLERGIES: Allergies  Allergen Reactions  . Amlodipine Besylate Swelling    Angioedema   . Lisinopril Other (See Comments)    Renal insufficiency  . Statins Other (See Comments)    Muscle aches  . Ropinirole Other (See Comments)    Causes excessive grogginess the following morning after taking  . Tape Other (See Comments)     SKIN IS THIN AND TAPE WILL ACTUALLY TEAR IT; PLEASE USE COBAN WRAP INSTEAD!!    HOME MEDICATIONS:  Current Outpatient Medications:  .  acetaminophen (TYLENOL) 500 MG tablet, Take 500 mg by mouth every 8 (eight) hours as needed for mild pain. (pain/headaches), Disp: , Rfl:  .  Amino Acids-Protein Hydrolys (FEEDING SUPPLEMENT, PRO-STAT SUGAR FREE 64,) LIQD, Take 30 mLs by mouth 2 (two) times daily., Disp: 900 mL, Rfl: 0 .  aspirin EC 81 MG tablet, Take 81 mg by mouth at bedtime., Disp: , Rfl:  .  calcium acetate (PHOSLO) 667 MG capsule, Take 667-2,001 mg See admin instructions by mouth. Take 3 capsules (2001 mg) by mouth three times daily with meals, take 1 capsule (667 mg) with snacks, Disp: , Rfl:  .  carvedilol (COREG) 25 MG tablet, Take 25 mg 2 (two) times daily by mouth., Disp: , Rfl:  .  ceFEPIme 2 g in dextrose 5 % 50 mL, Inject 2 g every Monday, Wednesday, and Friday at 6 PM into the vein., Disp: , Rfl:  .  collagenase (SANTYL) ointment, Apply daily topically., Disp: 15 g, Rfl: 0 .  ezetimibe (ZETIA) 10 MG tablet, Take 1 tablet (10 mg total) by mouth daily., Disp: 30 tablet, Rfl: 1 .  famotidine (PEPCID) 40 MG/5ML suspension, Take 2.5 mLs (20 mg total) by mouth at bedtime., Disp: 50 mL, Rfl: 0 .  finasteride (PROSCAR) 5 MG tablet, Take 1 tablet (5 mg total) by mouth daily., Disp: 30 tablet, Rfl: 1 .  folic acid-vitamin b complex-vitamin c-selenium-zinc (DIALYVITE) 3 MG TABS tablet, Take 1 tablet at bedtime by mouth., Disp: , Rfl:  .  gabapentin (NEURONTIN) 100 MG capsule, Take 1 capsule (100 mg total) by mouth at bedtime., Disp: 30 capsule, Rfl: 1 .  glimepiride (AMARYL) 1 MG tablet, Take 1 tablet (1 mg total) by mouth daily with breakfast., Disp: 30 tablet, Rfl: 1 .  Melatonin 3 MG TABS, Take 3 mg at bedtime by mouth., Disp: , Rfl:  .  multivitamin (RENA-VIT) TABS tablet, Take 1 tablet by mouth at bedtime., Disp: 30 tablet, Rfl: 0 .  Nutritional Supplements (FEEDING SUPPLEMENT, NEPRO CARB  STEADY,) LIQD, Take 237 mLs by mouth 2 (two) times daily between meals., Disp: , Rfl: 0 .  predniSONE (DELTASONE) 20 MG tablet, Take 1 tablet (20 mg total) by mouth daily with breakfast. Please note to continue taking 20 mg tablet for now  and you must be evaluated by your neurologist (Dr. [name]) to determine next Prednisone dose and frequency., Disp: 7 tablet, Rfl: 1 .  traZODone (DESYREL) 50 MG tablet, Take 1 tablet (50 mg total) by mouth at bedtime as needed for sleep., Disp: , Rfl:  .  warfarin (COUMADIN) 1 MG tablet, 1 1/2 tab daily (Patient taking differently: Take 1 mg daily with supper by mouth. ), Disp: 30 tablet, Rfl: 1 .  ipratropium-albuterol (DUONEB) 0.5-2.5 (3) MG/3ML SOLN, Take 3 mLs by nebulization every 6 (six) hours as needed. (Patient not taking: Reported on 06/09/2017), Disp: 360 mL, Rfl: 0  PAST MEDICAL HISTORY: Past Medical History:  Diagnosis Date  . Anemia 07/28/2017  . Atrial fibrillation ([place])    on Coumadin  . DDD (degenerative disc disease)   . Diabetes mellitus without complication ([place])   . ESRD (end stage renal disease) on dialysis ([place])   . Hyperlipidemia   . Hypertension   . Melanoma ([place]) 2010   removed at [facility]  . Second degree heart block    a. s/p STJ dual chamber PPM - Dr [name]  . Stroke [facility]) 01/2017    PAST SURGICAL HISTORY: Past Surgical History:  Procedure Laterality Date  . A/V FISTULAGRAM N/A 06/11/2017   Procedure: A/V Fistulagram;  Surgeon: Waynetta Sandy, MD;  Location: [facility];  Service: Cardiovascular;  Laterality: N/A;  . AV FISTULA PLACEMENT Right 05/31/2014   Procedure: Right Arm Brachiocephalic ARTERIOVENOUS FISTULA CREATION  ;  Surgeon: Conrad , MD;  Location: [place];  Service: Vascular;  Laterality: Right;  . CATARACT EXTRACTION W/ INTRAOCULAR LENS  IMPLANT, BILATERAL    . COLONOSCOPY    . EXCISION MELANOMA WITH SENTINEL LYMPH NODE BIOPSY Right 02/08/2016   Procedure: WIDE EXCISION RIGHT SHOULDER MELANOMA  WITH RIGHT SENTINEL LYMPH NODE BIOPSY;  Surgeon: [name], MD;  Location: [place];  Service: General;  Laterality: Right;  . LAMINECTOMY    . PERIPHERAL  VASCULAR BALLOON ANGIOPLASTY Right 06/11/2017   Procedure: PERIPHERAL VASCULAR BALLOON ANGIOPLASTY;  Surgeon: Waynetta Sandy, MD;  Location: [place] [facility];  Service: Cardiovascular;  Laterality: Right;  arm fistula  . PERMANENT PACEMAKER INSERTION N/A 01/20/2014   STJ Assurity dual chamber pacemaker implanted by Dr [name] for 2nd degree AV block    FAMILY HISTORY: Family History  Problem Relation Age of Onset  . Pulmonary embolism Mother   . Diabetes Mother   . Heart attack Father   . Diabetes Brother     SOCIAL HISTORY:  Social History   Socioeconomic History  . Marital status: Married    Spouse name: [name]  . Number of children: Not on file  . Years of education: Not on file  . Highest education level: Not on file  Social Needs  . Financial resource strain: Not on file  . Food insecurity - worry: Not on file  . Food insecurity - inability: Not on file  . Transportation needs - medical: Not on file  . Transportation needs - non-medical: Not on file  Occupational History    Comment: retired  Tobacco Use  . Smoking status: Never Smoker  . Smokeless tobacco: Never Used  Substance and Sexual Activity  . Alcohol use: No  . Drug use: No  . Sexual activity: No  Other Topics Concern  . Not on file  Social History Narrative   Lives in [place] with spouse.  Retired Theatre manager for [employer].     PHYSICAL EXAM  Vitals:   08/12/17 1444  BP: 135/74  Pulse: 79  Weight: 177 lb (80.3 kg)  Height: 5\' 10"  (1.778 m)    Body mass index is 25.4 kg/m.   General: The patient is well-developed and well-nourished and in no acute distress.  He is in a wheelchair.  Eyes:  Funduscopic exam shows normal optic discs.  Neck: No carotid bruits are noted.  The neck is nontender.   Mild reduced range of  motion.  Cardiovascular:   The heart has regular rate and rhythm and he has a mild murmur. Peripheral pulses present. Lungs are clear.    Musculoskeletal:  He has mild lower lumbar paraspinal tenderness  Neurologic Exam  Mental status: The patient is alert and oriented x 3 at the time of the examination. The patient has apparent normal recent and remote memory, with an apparently normal attention span and concentration ability.   Speech is normal.  Cranial nerves: Extraocular movements are full. Facial strength and sensation is normal. No ptosis.. No dysarthria is noted.  The tongue is midline, and the patient has symmetric elevation of the soft palate. No obvious hearing deficits are noted.  Motor:  Muscle bulk is normal.   Tone is normal. Strength is  5 / 5 in all 4 extremities except 4+/5 in iliopsoas muscles of hip flexures and 4/5 toe extensors.   Sensory: He has reduced vibration sensation in the toes and ankles and normal sensation in the knees   Coordination: Cerebellar testing reveals good finger-nose-finger and poor heel-to-shin bilaterally.  Gait and station: He is able to stand up independently but requires using both arms. According onto my hands she can take some steps but his legs buckle.    Reflexes: Deep tendon reflexes are symmetric and normal bilaterally.     DIAGNOSTIC DATA (LABS, IMAGING, TESTING) - I reviewed patient records, labs, notes, testing and imaging myself where available.  Lab Results  Component Value Date   WBC 6.9 07/30/2017  HGB 8.8 (L) 07/30/2017   HCT 27.9 (L) 07/30/2017   MCV 103.3 (H) 07/30/2017   PLT 185 07/30/2017      Component Value Date/Time   NA 131 (L) 07/30/2017 0815   NA 140 02/28/2017 0916   NA 138 10/17/2016 1303   K 4.0 07/30/2017 0815   K 5.1 10/17/2016 1303   CL 97 (L) 07/30/2017 0815   CO2 23 07/30/2017 0815   CO2 18 (L) 10/17/2016 1303   GLUCOSE 92 07/30/2017 0815   GLUCOSE 197 (H) 10/17/2016 1303   BUN 61 (H)  07/30/2017 0815   BUN 46 (H) 02/28/2017 0916   BUN 71.0 (H) 10/17/2016 1303   CREATININE 6.78 (H) 07/30/2017 0815   CREATININE 5.9 (HH) 10/17/2016 1303   CALCIUM 8.7 (L) 07/30/2017 0815   CALCIUM 8.9 10/17/2016 1303   PROT 5.8 (L) 07/27/2017 1608   PROT 5.0 (L) 02/28/2017 0916   PROT 6.2 (L) 10/17/2016 1303   ALBUMIN 2.3 (L) 07/30/2017 0815   ALBUMIN 3.2 (L) 02/28/2017 0916   ALBUMIN 2.9 (L) 10/17/2016 1303   AST 20 07/27/2017 1608   AST 8 10/17/2016 1303   ALT 22 07/27/2017 1608   ALT 13 10/17/2016 1303   ALKPHOS 45 07/27/2017 1608   ALKPHOS 95 10/17/2016 1303   BILITOT 0.6 07/27/2017 1608   BILITOT 0.2 02/28/2017 0916   BILITOT 0.24 10/17/2016 1303   GFRNONAA 7 (L) 07/30/2017 0815   GFRAA 8 (L) 07/30/2017 0815   Lab Results  Component Value Date   CHOL 107 03/20/2017   HDL 14 (L) 03/20/2017   LDLCALC 32 03/20/2017   TRIG 306 (H) 03/20/2017   CHOLHDL 7.6 03/20/2017   Lab Results  Component Value Date   HGBA1C 5.7 (H) 03/20/2017   Lab Results  Component Value Date   CZYSAYTK16 010 03/18/2017   Lab Results  Component Value Date   TSH 0.748 02/28/2017       ASSESSMENT AND PLAN  Cerebrovascular accident (CVA), unspecified mechanism ([place])  Myasthenia gravis in crisis ([place])  Myasthenia gravis ([place]) - Plan: NCV with EMG(electromyography)  Pacemaker  Cervical stenosis of spine - Plan: NCV with EMG(electromyography)  Weakness - Plan: NCV with EMG(electromyography)   1.   He appears to have responded some to the IVIG for the myasthenia gravis.   He will get another dose in a couple weeks. 2.   He likely has a second process going on. He has had a stroke earlier this year. Additionally he has some muscle wasting, especially in the legs he has spinal stenosis and CT scan at C6-C7 (below his ACDF). He also has degenerative changes in the lumbar spine. Due to his pacemaker, we cannot get an MRI to help clarify. A CT myelogram would be helpful but we may not be able  to obtain as he has chronic renal failure and I will need to clear this with his nephrologist. Additionally he is on anticoagulation and would need to stop x 5 days.  This will need to be cleared with his cardiologist.   He also has muscle wasting and I can't rule out ALS or related disorder.   We will check a nerve conduction/EMG to further evaluate this 3.   Continue prednisone 20 mg daily 4.    Advised to stay active and use the walker when he ambulates. 5.    rtc 3 months or sooner if new or worsening symptoms.    Dr, Daneen Schick is cardiologist   Edrick Oh is  nephrologist  45 minute face-to-face evaluation with greater than one half the time counseling and coordinating care about his multiple neurologic issues and poor ambulation.  Richard A. [name], MD, PhD, Charlynn Grimes 07/13/2535, 6:44 PM Certified in Neurology, Clinical Neurophysiology, Sleep Medicine, Pain Medicine and Neuroimaging  [facility] [address] [place], [name] [address] [phone]

## 2017-08-14 ENCOUNTER — Telehealth: Payer: Self-pay | Admitting: Pharmacist

## 2017-08-14 NOTE — Telephone Encounter (Signed)
-----   Message from Belva Crome, MD sent at 08/14/2017  3:55 PM EST ----- Regarding: RE: anti-coag He is on Coumadin, has a history of atrial fibrillation, and has had prior strokes.  I researched his chart and apparently strokes were not felt related to cardioembolic phenomena.  Therefore, it would be okay to discontinue Coumadin for up to 5 days to allow myelogram and then resume therapy.  Most recently, he has been stable and in sinus rhythm. ----- Message ----- From: Britt Bottom, MD Sent: 08/13/2017   1:13 PM To: Belva Crome, MD Subject: anti-coag                                      Dr. Tamala Julian, Mr. Geraldo is a mutual patient that you see for atrial fibrillation, other heart issues and anticoagulation and IVC for myasthenia gravis. He has had further worsening in his gait and I am concerned that he has taken spinal stenosis at C6-C7 contributing to this issue based on significant changes on the noncontrasted CT of the cervical spine.  I would like for him to have a CT myelogram to better assess the severity of the spinal stenosis.    He is currently on Coumadin.  Would he be able to come off for 5 days or so in order to get the injection of dye injection into the spine?  Thank you Delfino Lovett

## 2017-08-15 ENCOUNTER — Other Ambulatory Visit: Payer: Self-pay | Admitting: Neurology

## 2017-08-15 DIAGNOSIS — M6281 Muscle weakness (generalized): Secondary | ICD-10-CM

## 2017-08-15 DIAGNOSIS — G959 Disease of spinal cord, unspecified: Secondary | ICD-10-CM

## 2017-08-15 DIAGNOSIS — R531 Weakness: Secondary | ICD-10-CM

## 2017-08-15 DIAGNOSIS — M545 Low back pain: Secondary | ICD-10-CM

## 2017-08-15 DIAGNOSIS — M4802 Spinal stenosis, cervical region: Secondary | ICD-10-CM

## 2017-08-15 NOTE — Progress Notes (Signed)
Faith,  Please let MR. Orlick know I cleared the myelogram with his cardiologist and nephrologist.  On coumadin, will need to know to go off x 5 days before procedure and restart the next day.      Thx Heyward Douthit

## 2017-08-27 ENCOUNTER — Other Ambulatory Visit: Payer: Self-pay

## 2017-08-27 ENCOUNTER — Emergency Department (HOSPITAL_BASED_OUTPATIENT_CLINIC_OR_DEPARTMENT_OTHER): Payer: Medicare Other

## 2017-08-27 ENCOUNTER — Emergency Department (HOSPITAL_BASED_OUTPATIENT_CLINIC_OR_DEPARTMENT_OTHER)
Admission: EM | Admit: 2017-08-27 | Discharge: 2017-08-27 | Disposition: A | Discharge status: Home / Self Care | Payer: Medicare Other | Attending: Emergency Medicine | Admitting: Emergency Medicine

## 2017-08-27 ENCOUNTER — Encounter (HOSPITAL_BASED_OUTPATIENT_CLINIC_OR_DEPARTMENT_OTHER): Payer: Self-pay

## 2017-08-27 DIAGNOSIS — Z992 Dependence on renal dialysis: Secondary | ICD-10-CM | POA: Diagnosis not present

## 2017-08-27 DIAGNOSIS — L89512 Pressure ulcer of right ankle, stage 2: Secondary | ICD-10-CM | POA: Diagnosis not present

## 2017-08-27 DIAGNOSIS — Z7901 Long term (current) use of anticoagulants: Secondary | ICD-10-CM | POA: Diagnosis not present

## 2017-08-27 DIAGNOSIS — E119 Type 2 diabetes mellitus without complications: Secondary | ICD-10-CM | POA: Diagnosis not present

## 2017-08-27 DIAGNOSIS — I4891 Unspecified atrial fibrillation: Secondary | ICD-10-CM | POA: Diagnosis not present

## 2017-08-27 DIAGNOSIS — I132 Hypertensive heart and chronic kidney disease with heart failure and with stage 5 chronic kidney disease, or end stage renal disease: Secondary | ICD-10-CM | POA: Insufficient documentation

## 2017-08-27 DIAGNOSIS — I5032 Chronic diastolic (congestive) heart failure: Secondary | ICD-10-CM | POA: Diagnosis not present

## 2017-08-27 DIAGNOSIS — N185 Chronic kidney disease, stage 5: Secondary | ICD-10-CM | POA: Insufficient documentation

## 2017-08-27 DIAGNOSIS — Z7982 Long term (current) use of aspirin: Secondary | ICD-10-CM | POA: Diagnosis not present

## 2017-08-27 DIAGNOSIS — M25571 Pain in right ankle and joints of right foot: Secondary | ICD-10-CM | POA: Diagnosis present

## 2017-08-27 DIAGNOSIS — R69 Illness, unspecified: Secondary | ICD-10-CM

## 2017-08-27 LAB — BASIC METABOLIC PANEL
Anion gap: 14 (ref 5–15)
BUN: 81 mg/dL — AB (ref 6–20)
CALCIUM: 9.2 mg/dL (ref 8.9–10.3)
CO2: 23 mmol/L (ref 22–32)
CREATININE: 7.61 mg/dL — AB (ref 0.61–1.24)
Chloride: 97 mmol/L — ABNORMAL LOW (ref 101–111)
GFR calc Af Amer: 7 mL/min — ABNORMAL LOW (ref >60)
GFR calc non Af Amer: 6 mL/min — ABNORMAL LOW (ref >60)
GLUCOSE: 316 mg/dL — AB (ref 65–99)
Potassium: 5.7 mmol/L — ABNORMAL HIGH (ref 3.5–5.1)
Sodium: 134 mmol/L — ABNORMAL LOW (ref 135–145)

## 2017-08-27 LAB — I-STAT CG4 LACTIC ACID, ED: LACTIC ACID, VENOUS: 2.97 mmol/L — AB (ref 0.5–1.9)

## 2017-08-27 LAB — PROTIME-INR
INR: 1.42
Prothrombin Time: 17.2 seconds — ABNORMAL HIGH (ref 11.4–15.2)

## 2017-08-27 MED ORDER — AMOXICILLIN-POT CLAVULANATE 875-125 MG PO TABS: 1.0000 | ORAL_TABLET | Freq: Two times a day (BID) | ORAL | 0 refills | Status: DC

## 2017-08-27 MED ORDER — HYDROCODONE-ACETAMINOPHEN 5-325 MG PO TABS
1.0000 | ORAL_TABLET | Freq: Once | ORAL | Status: AC
  Administered 2017-08-27: 1 via ORAL
  Filled 2017-08-27: qty 1

## 2017-08-27 MED ORDER — HYDROCODONE-ACETAMINOPHEN 5-325 MG PO TABS: 1.0000 | ORAL_TABLET | ORAL | 0 refills | Status: DC | PRN

## 2017-08-27 MED ORDER — CEFTRIAXONE SODIUM 1 G IJ SOLR
1.0000 g | Freq: Once | INTRAMUSCULAR | Status: AC
  Administered 2017-08-27: 1 g via INTRAMUSCULAR
  Filled 2017-08-27: qty 10

## 2017-08-27 NOTE — ED Triage Notes (Signed)
Pt later added c/o dizziness with near syncope x 3-4 days

## 2017-08-27 NOTE — ED Triage Notes (Signed)
C/o pain to right LE x 1 week-hx of multiple falls with no known direct injury-presents to triage in w/c-NAD

## 2017-08-27 NOTE — ED Provider Notes (Signed)
[facility] EMERGENCY DEPARTMENT Provider Note   CSN: [id number] Arrival date & time: 08/27/17  1241     History   Chief Complaint Chief Complaint  Patient presents with  . Leg Pain  . Dizziness    HPI James David is a 81 y.o. male.  HPI She has pain in the right ankle.  He does have a wound which is been present since his hospitalization.  He reports that dressing changes have been done on it but no other specific treatment.  He reports over the past 2 weeks the pain has become increasingly more severe.  Baseline the patient is non-weightbearing due to complications of [place] and weakness.  He reports he had not become competent with transfers but any attempt at weight on that ankle is excruciatingly painful.  He reports his home health nurse was concerned for possibly a hairline fracture or other injury given his degree of pain.  Patient reports he is taking only Tylenol for pain control.  He has not had fever or other associated symptoms.  The patient does have significant chronic illness and is anticoagulated.  Patient denies however he has had other feelings of generalized weakness, pain or malaise since his discharge from the hospital. Past Medical History:  Diagnosis Date  . Anemia 07/28/2017  . Atrial fibrillation ([place])    on Coumadin  . DDD (degenerative disc disease)   . Diabetes mellitus without complication ([place])   . ESRD (end stage renal disease) on dialysis ([place])   . Hyperlipidemia   . Hypertension   . Melanoma ([place]) 2010   removed at [facility]  . Second degree heart block    a. s/p STJ dual chamber PPM - Dr [name]  . Stroke [facility]) 01/2017    Patient Active Problem List   Diagnosis Date Noted  . Cervical stenosis of spine 08/12/2017  . UTI (urinary tract infection) 07/27/2017  . Weakness 07/27/2017  . Left elbow pain 07/27/2017  . Pressure injury of skin 06/11/2017  . Bleeding at insertion site 06/09/2017  . Myasthenia gravis ([place])   . Benign  essential HTN   . Labile blood glucose   . Dysphagia   . Leukocytosis   . Acute respiratory failure with hypoxia ([place])   . Myasthenia gravis in crisis ([place]) 03/17/2017  . History of CVA in adulthood 03/17/2017  . Atrial fibrillation, permanent ([place]) 03/17/2017  . Supratherapeutic INR 03/17/2017  . Dyslipidemia 03/17/2017  . Anemia 03/17/2017  . Low back pain 02/28/2017  . Neck pain 02/28/2017  . Polyneuropathy 02/28/2017  . Gait disturbance, post-stroke   . Subcortical infarction ([place]) 02/06/2017  . Intracranial vascular stenosis 02/04/2017  . Syncope   . Orthostatic hypotension   . Stroke ([place]) 02/01/2017  . TIA (transient ischemic attack) 01/29/2017  . CKD (chronic kidney disease) stage V requiring chronic dialysis ([place]) 01/29/2017  . DDD (degenerative disc disease) 01/29/2017  . ESRD (end stage renal disease) on dialysis ([place])   . Malignant melanoma of arm ([place]) 03/13/2016  . Renal lesion 03/10/2016  . BPH (benign prostatic hyperplasia) 03/07/2016  . Pre-transplant evaluation for ESRD (end stage renal disease) 07/05/2015  . Pacemaker 10/13/2014  . CHF (congestive heart failure) ([place]) 05/25/2014  . History of melanoma 05/25/2014  . Chronic diastolic heart failure ([place]) 03/24/2014  . Second-degree heart block 12/13/2013  . Essential hypertension 12/13/2013  . Diabetes mellitus due to underlying condition with diabetic nephropathy ([place]) 12/13/2013  . Hyperlipidemia 12/13/2013    Past Surgical History:  Procedure Laterality Date  . A/V FISTULAGRAM N/A 06/11/2017   Procedure: A/V Fistulagram;  Surgeon: Waynetta Sandy, MD;  Location: [facility] CV LAB;  Service: Cardiovascular;  Laterality: N/A;  . AV FISTULA PLACEMENT Right 05/31/2014   Procedure: Right Arm Brachiocephalic ARTERIOVENOUS FISTULA CREATION  ;  Surgeon: Conrad Parker, MD;  Location: [facility];  Service: Vascular;  Laterality: Right;  . CATARACT EXTRACTION W/ INTRAOCULAR LENS  IMPLANT, BILATERAL    .  COLONOSCOPY    . EXCISION MELANOMA WITH SENTINEL LYMPH NODE BIOPSY Right 02/08/2016   Procedure: WIDE EXCISION RIGHT SHOULDER MELANOMA WITH RIGHT SENTINEL LYMPH NODE BIOPSY;  Surgeon: [name], MD;  Location: [facility];  Service: General;  Laterality: Right;  . LAMINECTOMY    . PERIPHERAL VASCULAR BALLOON ANGIOPLASTY Right 06/11/2017   Procedure: PERIPHERAL VASCULAR BALLOON ANGIOPLASTY;  Surgeon: Waynetta Sandy, MD;  Location: [place] CV LAB;  Service: Cardiovascular;  Laterality: Right;  arm fistula  . PERMANENT PACEMAKER INSERTION N/A 01/20/2014   STJ Assurity dual chamber pacemaker implanted by Dr [name] for 2nd degree AV block       Home Medications    Prior to Admission medications   Medication Sig Start Date End Date Taking? Authorizing Provider  acetaminophen (TYLENOL) 500 MG tablet Take 500 mg by mouth every 8 (eight) hours as needed for mild pain. (pain/headaches)    [provider]  Amino Acids-Protein Hydrolys (FEEDING SUPPLEMENT, PRO-STAT SUGAR FREE 64,) LIQD Take 30 mLs by mouth 2 (two) times daily. 03/25/17   Theodis Blaze, MD  amoxicillin-clavulanate (AUGMENTIN) 875-125 MG tablet Take 1 tablet by mouth 2 (two) times daily. One po bid x 7 days 08/27/17   Charlesetta Shanks, MD  aspirin EC 81 MG tablet Take 81 mg by mouth at bedtime.    [provider]  calcium acetate (PHOSLO) 667 MG capsule Take 667-2,001 mg See admin instructions by mouth. Take 3 capsules (2001 mg) by mouth three times daily with meals, take 1 capsule (667 mg) with snacks    [provider]  ceFEPIme 2 g in dextrose 5 % 50 mL Inject 2 g every Monday, Wednesday, and Friday at 6 PM into the vein. 08/01/17   Geradine Girt, DO  collagenase (SANTYL) ointment Apply daily topically. 08/01/17   Geradine Girt, DO  ezetimibe (ZETIA) 10 MG tablet Take 1 tablet (10 mg total) by mouth daily. 02/14/17   Angiulli, Lavon Paganini, PA-C  famotidine (PEPCID) 40 MG/5ML suspension Take 2.5 mLs  (20 mg total) by mouth at bedtime. 03/26/17   Theodis Blaze, MD  finasteride (PROSCAR) 5 MG tablet Take 1 tablet (5 mg total) by mouth daily. 02/14/17   Angiulli, Lavon Paganini, PA-C  folic acid-vitamin b complex-vitamin c-selenium-zinc (DIALYVITE) 3 MG TABS tablet Take 1 tablet at bedtime by mouth.    [provider]  gabapentin (NEURONTIN) 100 MG capsule Take 1 capsule (100 mg total) by mouth at bedtime. 02/14/17   Angiulli, Lavon Paganini, PA-C  glimepiride (AMARYL) 1 MG tablet Take 1 tablet (1 mg total) by mouth daily with breakfast. 02/14/17   Angiulli, Lavon Paganini, PA-C  HYDROcodone-acetaminophen (NORCO/VICODIN) 5-325 MG tablet Take 1-2 tablets by mouth every 4 (four) hours as needed for moderate pain or severe pain. 08/27/17   Charlesetta Shanks, MD  Melatonin 3 MG TABS Take 3 mg at bedtime by mouth.    [provider]  Nutritional Supplements (FEEDING SUPPLEMENT, NEPRO CARB STEADY,) LIQD Take 237 mLs by mouth 2 (  two) times daily between meals. 03/25/17   Theodis Blaze, MD  predniSONE (DELTASONE) 20 MG tablet Take 1 tablet (20 mg total) by mouth daily with breakfast. Please note to continue taking 20 mg tablet for now  and you must be evaluated by your neurologist (Dr. [name]) to determine next Prednisone dose and frequency. 03/26/17   Bonnielee Haff, MD  traZODone (DESYREL) 50 MG tablet Take 1 tablet (50 mg total) by mouth at bedtime as needed for sleep. 03/25/17   Theodis Blaze, MD  warfarin (COUMADIN) 1 MG tablet 1 1/2 tab daily Patient taking differently: Take 1 mg daily with supper by mouth.  02/14/17   Angiulli, Lavon Paganini, PA-C    Family History Family History  Problem Relation Age of Onset  . Pulmonary embolism Mother   . Diabetes Mother   . Heart attack Father   . Diabetes Brother     Social History Social History   Tobacco Use  . Smoking status: Never Smoker  . Smokeless tobacco: Never Used  Substance Use Topics  . Alcohol use: No  . Drug use: No     Allergies   Amlodipine  besylate; Lisinopril; Statins; Ropinirole; and Tape   Review of Systems Review of Systems 10 Systems reviewed and are negative for acute change except as noted in the HPI.   Physical Exam Updated Vital Signs BP (!) 145/72 (BP Location: Left Wrist)   Pulse 73   Temp 98 F (36.7 C) (Oral)   Resp 16   Ht 5\' 10"  (1.778 m)   Wt 80.3 kg (177 lb)   SpO2 100%   BMI 25.40 kg/m   Physical Exam  Constitutional: He is oriented to person, place, and time.  Patient is alert and nontoxic.  No respiratory distress.  He is deconditioned in appearance.  HENT:  Head: Normocephalic and atraumatic.  Mouth/Throat: Oropharynx is clear and moist.  Eyes: EOM are normal.  Cardiovascular: Normal rate, regular rhythm, normal heart sounds and intact distal pulses.  Pulmonary/Chest: Effort normal and breath sounds normal.  Abdominal: Soft. He exhibits no distension. There is no tenderness. There is no guarding.  Musculoskeletal:  Patient has a pressure ulcer to the lateral malleolus on the right.  This is approximately 2.5 cm.  See attached image.  Patient has pain with palpation in that area and pain with range of motion of the ankle.  The foot is not swollen.  The calf is soft and nontender.  Neurological: He is alert and oriented to person, place, and time.  She is alert and oriented.  He is deconditioned from [employer].  He does not have a focal deficit at this time in terms of spontaneous upper extremity and lower extremity movement in the stretcher and clear mental status.  Skin: Skin is warm and dry.  Patient has extensive senile ecchymoses and skin tears of bilateral upper extremities.  There are various ages.  Psychiatric: He has a normal mood and affect.             ED Treatments / Results  Labs (all labs ordered are listed, but only abnormal results are displayed) Labs Reviewed  PROTIME-INR - Abnormal; Notable for the following components:      Result Value   Prothrombin Time  17.2 (*)    All other components within normal limits  BASIC METABOLIC PANEL - Abnormal; Notable for the following components:   Sodium 134 (*)    Potassium 5.7 (*)    Chloride 97 (*)  Glucose, Bld 316 (*)    BUN 81 (*)    Creatinine, Ser 7.61 (*)    GFR calc non Af Amer 6 (*)    GFR calc Af Amer 7 (*)    All other components within normal limits  I-STAT CG4 LACTIC ACID, ED - Abnormal; Notable for the following components:   Lactic Acid, Venous 2.97 (*)    All other components within normal limits  CBC WITH DIFFERENTIAL/PLATELET  I-STAT CG4 LACTIC ACID, ED    EKG  EKG Interpretation None       Radiology Dg Ankle Complete Right  Result Date: 08/27/2017 CLINICAL DATA:  RIGHT ankle wound. EXAM: RIGHT ANKLE - COMPLETE 3+ VIEW COMPARISON:  06/28/2016. FINDINGS: There is a soft tissue defect overlying the lateral malleolus. There is no air in the soft tissues. There is no radiopaque foreign body. There is no underlying osseous reaction. No fracture is seen. IMPRESSION: Soft tissue defect overlying the lateral malleolus appears to correlate with history of ankle wound. No underlying osseous abnormality. Electronically Signed   By: Staci Righter M.D.   On: 08/27/2017 16:43    Procedures Procedures (including critical care time)  Medications Ordered in ED Medications  HYDROcodone-acetaminophen (NORCO/VICODIN) 5-325 MG per tablet 1 tablet (1 tablet Oral Given 08/27/17 1529)  cefTRIAXone (ROCEPHIN) injection 1 g (1 g Intramuscular Given 08/27/17 1826)  HYDROcodone-acetaminophen (NORCO/VICODIN) 5-325 MG per tablet 1 tablet (1 tablet Oral Given 08/27/17 1846)     Initial Impression / Assessment and Plan / ED Course  I have reviewed the triage vital signs and the nursing notes.  Pertinent labs & imaging results that were available during my care of the patient were reviewed by me and considered in my medical decision making (see chart for details).     Final Clinical  Impressions(s) / ED Diagnoses   Final diagnoses:  Pressure injury of right ankle, stage 2  Severe comorbid illness   Presents with complaint of pain at site of pressure ulcer.  This has been going on for a number of weeks.  Patient denies constitutional symptoms.  He had does have severe comorbid illness.  Last dialysis was yesterday.  Patient has chronic severe weakness and poor mobility.  At this time, wound has chronic appearance, x-rays did not show any underlying bony involvement.  This appears to be depth to subcutaneous but not deeper.  Patient is developing some redness at the margins.  Will opt to treat with antibiotics.  Patient was very difficult IV start.  First dose of Rocephin given IM.  Patient will continue Augmentin and is counseled to follow-up with wound care.  Xeroform and bulky dressing applied in the emergency department.  ED Discharge Orders        Ordered    amoxicillin-clavulanate (AUGMENTIN) 875-125 MG tablet  2 times daily     08/27/17 1845    HYDROcodone-acetaminophen (NORCO/VICODIN) 5-325 MG tablet  Every 4 hours PRN     08/27/17 1845       Charlesetta Shanks, MD 08/27/17 1859

## 2017-08-27 NOTE — Discharge Instructions (Signed)
1.  Try to keep your ankle wound elevated and padded.  Call wound care center tomorrow morning to schedule an appointment as soon as possible. 2.  Take Augmentin twice daily as prescribed starting the evening of 12\13. 3.  Take 1-2 Vicodin every 6 hours as needed for pain.  You are using the Vicodin, take Colace stool softener) to avoid constipation. 4.  Your INR (Coumadin level) is too low at 1.4. Take a 1mg  dose of coumadin tonight and tomorrow, IN ADDITION TO YOUR REGULAR DOSE. Your level should then be rechecked by your doctor.

## 2017-08-27 NOTE — ED Notes (Signed)
ED Provider at bedside. 

## 2017-08-27 NOTE — ED Notes (Signed)
IV attempt x 2 with no success.

## 2017-08-27 NOTE — ED Notes (Signed)
EDP Pfeiffer notified unable to get an IV or another blood stick; Ultrasound at bedside per EDP request

## 2017-09-11 ENCOUNTER — Ambulatory Visit
Admission: RE | Admit: 2017-09-11 | Discharge: 2017-09-11 | Disposition: A | Discharge status: Home / Self Care | Payer: Medicare Other | Source: Ambulatory Visit | Attending: Neurology | Admitting: Neurology

## 2017-09-11 DIAGNOSIS — G959 Disease of spinal cord, unspecified: Secondary | ICD-10-CM

## 2017-09-11 DIAGNOSIS — M545 Low back pain: Secondary | ICD-10-CM

## 2017-09-11 DIAGNOSIS — M6281 Muscle weakness (generalized): Secondary | ICD-10-CM

## 2017-09-11 DIAGNOSIS — M4802 Spinal stenosis, cervical region: Secondary | ICD-10-CM

## 2017-09-11 DIAGNOSIS — R531 Weakness: Secondary | ICD-10-CM

## 2017-09-11 MED ORDER — IOPAMIDOL (ISOVUE-M 300) INJECTION 61%
10.0000 mL | Freq: Once | INTRAMUSCULAR | Status: AC | PRN
  Administered 2017-09-11: 10 mL via INTRATHECAL

## 2017-09-11 NOTE — Discharge Instructions (Signed)
Myelogram Discharge Instructions  1. Go home and rest quietly for the next 24 hours.  It is important to lie flat for the next 24 hours.  Get up only to go to the restroom.  You may lie in the bed or on a couch on your back, your stomach, your left side or your right side.  You may have one pillow under your head.  You may have pillows between your knees while you are on your side or under your knees while you are on your back.  2. DO NOT drive today.  Recline the seat as far back as it will go, while still wearing your seat belt, on the way home.  3. You may get up to go to the bathroom as needed.  You may sit up for 10 minutes to eat.  You may resume your normal diet and medications unless otherwise indicated.  Drink lots of extra fluids today and tomorrow.  4. The incidence of headache, nausea, or vomiting is about 5% (one in 20 patients).  If you develop a headache, lie flat and drink plenty of fluids until the headache goes away.  Caffeinated beverages may be helpful.  If you develop severe nausea and vomiting or a headache that does not go away with flat bed rest, call [phone].  5. You may resume normal activities after your 24 hours of bed rest is over; however, do not exert yourself strongly or do any heavy lifting tomorrow. If when you get up you have a headache when standing, go back to bed and force fluids for another 24 hours.  6. Call your physician for a follow-up appointment.  The results of your myelogram will be sent directly to your physician by the following day.  7. If you have any questions or if complications develop after you arrive home, please call [phone].  Discharge instructions have been explained to the patient.  The patient, or the person responsible for the patient, fully understands these instructions.       MAY RESUME COUMADIN TODAY.  May resume Trazodone on Dec. 28, 2018, after 1:00 pm.

## 2017-09-17 ENCOUNTER — Telehealth: Payer: Self-pay | Admitting: *Deleted

## 2017-09-17 NOTE — Telephone Encounter (Signed)
LMOM (identified vm) with below results.  They do not need to return this call unless they have questions/fim

## 2017-09-23 ENCOUNTER — Ambulatory Visit (INDEPENDENT_AMBULATORY_CARE_PROVIDER_SITE_OTHER): Payer: Medicare Other | Admitting: Neurology

## 2017-09-23 ENCOUNTER — Encounter: Payer: Medicare Other | Admitting: Neurology

## 2017-09-23 DIAGNOSIS — G629 Polyneuropathy, unspecified: Secondary | ICD-10-CM

## 2017-09-23 DIAGNOSIS — M542 Cervicalgia: Secondary | ICD-10-CM | POA: Diagnosis not present

## 2017-09-23 DIAGNOSIS — R531 Weakness: Secondary | ICD-10-CM | POA: Diagnosis not present

## 2017-09-23 DIAGNOSIS — G7000 Myasthenia gravis without (acute) exacerbation: Secondary | ICD-10-CM

## 2017-09-23 DIAGNOSIS — M4802 Spinal stenosis, cervical region: Secondary | ICD-10-CM

## 2017-09-23 DIAGNOSIS — Z0289 Encounter for other administrative examinations: Secondary | ICD-10-CM

## 2017-09-23 NOTE — Progress Notes (Signed)
Full Name: James David Gender: Male MRN #: [id number] Date of Birth: 04/03/1936    Visit Date: 09/23/2017 13:45 Age: 82 Years 56 Months Old Examining Physician: Arlice Colt, MD     History: James David is a 82 year old man who had a stroke in 2018 and later had aggressive weakness was diagnosed with myasthenia gravis (antibody positive). His myasthenia gravis has improved with IVIG. However, he has had difficulty walking since the diagnosis and now is able to assist with transfers but not walk independently. He was noted to have some atrophy and there was a question as to whether he could have ALS. Additionally, he has numbness in his legs and feet and has a 6 or 7 year history of diabetes mellitus.  Nerve conduction studies: In the legs, sural and superficial peroneal sensory responses were absent. Tibialis anterior motor response was absent. The tibial motor response was reduced in amplitude and mildly slowed.  In the arms, the median sensory and median motor responses were mildly slowed across the wrist and mildly reduced in amplitude. The ulnar motor and sensory responses were normal.  Electromyography: Needle EMG of selected muscles of the left arm was performed.   There was no abnormal spontaneous activity. Several of the C6 and C7 innervated muscles showed mild chronic innervation.   Owner and median innervated hand muscles were normal.  Needle EMG of selected muscles of the left leg was performed. There was no abnormal spontaneous activity. Mild chronic elevation was noted in all of the muscles. Good elevation was ordered in the most distal foot muscle.   IMPRESSION: This study shows electrophysiologic evidence of the following: 1.    Length-dependent sensorimotor polyneuropathy with predominantly axonal features. This is most consistent with diabetic polyneuropathy 2.     Left C6 and C7 chronic radiculopathies 3.     Left L4, L5 (+/- S1) chronic radiculopathies 4.      Mild median neuropathy at the left wrist 5.     No evidence of ALS.  Richard A. [name], MD, PhD, FAAN Certified in Neurology, Clinical Neurophysiology, Sleep Medicine, Pain Medicine and Neuroimaging Director, [facility] [address] [place], [place] [address] [phone]  Addendum:    The neuropathy is most likely due to diabetes as it has predominantly axonal features. To rule out treatable source of neuropathy,  SPEP/IEF will be checked --RAS       MNC    Nerve / Sites Muscle Latency Ref. Amplitude Ref. Rel Amp Segments Distance Velocity Ref. Area    ms ms mV mV %  cm m/s m/s mVms  L Median - APB     Wrist APB 4.4 ?4.4 3.0 ?4.0 100 Wrist - APB 7   14.9     Upper arm APB 9.6  2.7  88.2 Upper arm - Wrist 26 50 ?49 14.5  L Ulnar - ADM     Wrist ADM 4.3 ?3.3 7.5 ?6.0 100 Wrist - ADM 7   32.0     B.Elbow ADM 9.1  7.6  101 B.Elbow - Wrist 24 50 ?49 32.9     A.Elbow ADM 11.0  7.2  94.5 A.Elbow - B.Elbow 10 52 ?49 31.7         A.Elbow - Wrist      L Peroneal - EDB     Ankle EDB NR ?6.5 NR ?2.0 NR Ankle - EDB 9   NR  Fib head EDB NR  NR  NR Fib head - Ankle   ?44 NR     Pop fossa EDB NR  NR  NR Pop fossa - Fib head   ?44 NR         Pop fossa - Ankle      L Tibial - AH     Ankle AH 6.4 ?5.8 2.9 ?4.0 100 Ankle - AH 9   11.7     Pop fossa AH 19.3  1.5  52.6 Pop fossa - Ankle 45 35 ?41 4.9             SNC    Nerve / Sites Rec. Site Peak Lat Amp Segments Distance    ms V  cm  L Median - Orthodromic (Dig II, Mid palm)     Dig II Wrist 4.1 13 Dig II - Wrist 13  L Ulnar - Orthodromic, (Dig V, Mid palm)     Dig V Wrist 3.4 16 Dig V - Wrist 11         SNC    Nerve / Sites Rec. Site Peak Lat Ref.  Amp Ref. Segments Distance    ms ms V V  cm  L Sural - Ankle (Calf)     Calf Ankle NR ?4.4 NR ?6 Calf - Ankle 14  L Superficial peroneal - Ankle     Lat leg Ankle NR ?4.4 NR ?6 Lat leg - Ankle  14         F  Wave    Nerve F Lat Ref.   ms ms  L Median - APB 35.7 ?31.0  L Ulnar - ADM 36.3 ?32.0         EMG full       EMG Summary Table    Spontaneous MUAP Recruitment  Muscle IA Fib PSW Fasc Other Amp Dur. Poly Pattern  L. Deltoid Normal None None None _______ Normal Normal Normal Normal  L. Triceps brachii Normal None None None _______ Increased Increased 1+ Reduced  L. Biceps brachii Normal None None None _______ Normal Increased 1+ Reduced  L. Extensor digitorum communis Normal None None None _______ Increased Increased 1+ Reduced  L. First dorsal interosseous Normal None None None _______ Normal Normal Normal Normal  L. Vastus medialis Normal None None None _______ Increased Increased 1+ Reduced  Gastrocnem. Normal None None None  Increased Increased 1+ Reduced  L.  Ext Hall. Longus Normal None None None  Increased Increased 2+ Reduced  L. Tibialis anterior Normal None None None _______ Normal Increased 1+ Reduced  L. Dorsal interossei (pedis) Normal None None None _______ Normal Normal Normal Reduced

## 2017-09-25 LAB — MULTIPLE MYELOMA PANEL, SERUM
Albumin SerPl Elph-Mcnc: 3.1 g/dL (ref 2.9–4.4)
Albumin/Glob SerPl: 1.1 (ref 0.7–1.7)
Alpha 1: 0.2 g/dL (ref 0.0–0.4)
Alpha2 Glob SerPl Elph-Mcnc: 0.8 g/dL (ref 0.4–1.0)
B-Globulin SerPl Elph-Mcnc: 0.9 g/dL (ref 0.7–1.3)
Gamma Glob SerPl Elph-Mcnc: 1.2 g/dL (ref 0.4–1.8)
Globulin, Total: 3.1 g/dL (ref 2.2–3.9)
IGA/IMMUNOGLOBULIN A, SERUM: 171 mg/dL (ref 61–437)
IGM (IMMUNOGLOBULIN M), SRM: 116 mg/dL (ref 15–143)
IgG (Immunoglobin G), Serum: 1138 mg/dL (ref 700–1600)
TOTAL PROTEIN: 6.2 g/dL (ref 6.0–8.5)

## 2017-09-26 ENCOUNTER — Telehealth: Payer: Self-pay | Admitting: *Deleted

## 2017-09-26 NOTE — Telephone Encounter (Signed)
Spoke with wife and per RAS, advised lab work done in our office is fine.  She verbalized understanding of same/fim

## 2017-09-26 NOTE — Telephone Encounter (Signed)
-----   Message from Britt Bottom, MD sent at 09/25/2017  5:39 PM EST ----- Please let the patient know that the lab work is fine.

## 2017-09-29 ENCOUNTER — Telehealth: Payer: Self-pay | Admitting: *Deleted

## 2017-09-29 NOTE — Telephone Encounter (Signed)
I have spoken with [name] and per RAS, advised lab work for neuropathy is normal. She verbalized understanding of same/fim

## 2017-09-29 NOTE — Telephone Encounter (Signed)
-----   Message from Britt Bottom, MD sent at 09/26/2017  1:37 PM EST ----- The lab work for the neuropathy was normal.

## 2017-10-06 ENCOUNTER — Telehealth: Payer: Self-pay | Admitting: Neurology

## 2017-10-06 MED ORDER — GABAPENTIN 100 MG PO CAPS: ORAL_CAPSULE | ORAL | 1 refills | Status: DC

## 2017-10-06 NOTE — Addendum Note (Signed)
Addended by: France Ravens I on: 10/06/2017 04:47 PM   Modules accepted: Orders

## 2017-10-06 NOTE — Telephone Encounter (Signed)
Spoke with Mrs. Brindle and per RAS, advised ok to increase Gabapentin to 100mg  in the am, 200mg  qhs.  She verbalized understanding of same.  New rx. escribed to pharmacy/fim

## 2017-10-06 NOTE — Telephone Encounter (Signed)
Spoke with Mrs. Orrego. Pt. is still in a nsg. facility.  Having more neuropathic pain in legs.  On Gabapentin 100mg  qhs, and Tramadol. Will speak with RAS and call her back/fim

## 2017-10-06 NOTE — Telephone Encounter (Signed)
Pt's wife called the pt is having pain in legs and feet and wanting something for discomfort. Please call to advise

## 2017-10-09 ENCOUNTER — Encounter: Payer: Self-pay | Admitting: Podiatry

## 2017-10-09 ENCOUNTER — Ambulatory Visit: Payer: Medicare Other | Admitting: Podiatry

## 2017-10-09 DIAGNOSIS — M79674 Pain in right toe(s): Secondary | ICD-10-CM

## 2017-10-09 DIAGNOSIS — M79675 Pain in left toe(s): Secondary | ICD-10-CM | POA: Diagnosis not present

## 2017-10-09 DIAGNOSIS — B351 Tinea unguium: Secondary | ICD-10-CM

## 2017-10-09 NOTE — Patient Instructions (Signed)

## 2017-10-09 NOTE — Progress Notes (Signed)
Subjective:   Patient ID: James David, male   DOB: 82 y.o.   MRN: [id number]   HPI Patient presents with nail disease with incurvation 1-5 both feet that are getting painful and patient is in wheelchair   ROS      Objective:  Physical Exam  Incurvated nailbeds 1-5 both feet that are painful with distal redness but no active drainage mycotic nail infection with pain 1-5 both feet     Assessment:  Mycotic nail infection with pain 1-5 both feet     Plan:  Debride painful nailbeds 1-5 both feet

## 2017-10-10 ENCOUNTER — Other Ambulatory Visit (HOSPITAL_BASED_OUTPATIENT_CLINIC_OR_DEPARTMENT_OTHER): Payer: Self-pay | Admitting: Family Medicine

## 2017-10-10 DIAGNOSIS — R946 Abnormal results of thyroid function studies: Secondary | ICD-10-CM

## 2017-10-11 ENCOUNTER — Ambulatory Visit (HOSPITAL_BASED_OUTPATIENT_CLINIC_OR_DEPARTMENT_OTHER)
Admission: RE | Admit: 2017-10-11 | Discharge: 2017-10-11 | Disposition: A | Discharge status: Home / Self Care | Payer: Medicare Other | Source: Ambulatory Visit | Attending: Family Medicine | Admitting: Family Medicine

## 2017-10-11 DIAGNOSIS — R946 Abnormal results of thyroid function studies: Secondary | ICD-10-CM

## 2017-10-21 ENCOUNTER — Encounter: Payer: Self-pay | Admitting: Vascular Surgery

## 2017-10-21 ENCOUNTER — Ambulatory Visit: Payer: Medicare Other | Admitting: Vascular Surgery

## 2017-10-21 ENCOUNTER — Encounter: Payer: Self-pay | Admitting: *Deleted

## 2017-10-21 ENCOUNTER — Other Ambulatory Visit: Payer: Self-pay | Admitting: *Deleted

## 2017-10-21 ENCOUNTER — Telehealth: Payer: Self-pay | Admitting: *Deleted

## 2017-10-21 VITALS — BP 119/61 | HR 63 | Temp 98.2°F | Resp 20 | Ht 70.0 in | Wt 177.0 lb

## 2017-10-21 DIAGNOSIS — I739 Peripheral vascular disease, unspecified: Secondary | ICD-10-CM

## 2017-10-21 NOTE — H&P (View-Only) (Signed)
[facility] [place]  Patient name: James David MRN: [id number] DOB: Feb 13, 1936 Sex: male  REASON FOR VISIT: Evaluation bilateral lower extremity ulceration and limb threatening ischemia  HPI: [name] is a 82 y.o. male here today for evaluation.  He is known to our practice from prior access for hemodialysis.  He has had a difficult year in 2018.  Had initiation of hemodialysis in May and then subsequent suffered a stroke in May.  Was diagnosed with myasthenia gravis in June.  He is essentially wheelchair-bound.  He is able to provide slight assistance with transfer but is mostly transfer from bed to chair.  Has had ulcerations on both lower extremities which is being treated at a wound center is seen today as a urgent add-on for evaluation of peripheral vascular occlusive disease.  He is here today with his wife.  I know her from prior uneventful right carotid endarterectomy 3 or 4 years ago.  He does report with some discomfort in both lower extremities but no true arterial rest pain.  On chronic Coumadin therapy for atrial fibrillation and does have a pacemaker.  Past Medical History:  Diagnosis Date  . Anemia 07/28/2017  . Atrial fibrillation ([place])    on Coumadin  . CHF (congestive heart failure) ([place])   . DDD (degenerative disc disease)   . Diabetes mellitus without complication ([place])   . ESRD (end stage renal disease) on dialysis ([place])   . Hyperlipidemia   . Hypertension   . Melanoma ([place]) 2010   removed at [facility]  . Second degree heart block    a. s/p STJ dual chamber PPM - Dr [name]  . Stroke [facility]) 01/2017    Family History  Problem Relation Age of Onset  . Pulmonary embolism Mother   . Diabetes Mother   . Heart attack Father   . Diabetes Brother     SOCIAL HISTORY: Social History   Tobacco Use  . Smoking status: Never Smoker  . Smokeless tobacco: Never Used  Substance Use Topics  . Alcohol use: No     Allergies  Allergen Reactions  . Amlodipine Besylate Swelling    Angioedema   . Lisinopril Other (See Comments)    Renal insufficiency  . Statins Other (See Comments)    Muscle aches  . Ropinirole Other (See Comments)    Causes excessive grogginess the following morning after taking  . Tape Other (See Comments)    SKIN IS THIN AND TAPE WILL ACTUALLY TEAR IT; PLEASE USE COBAN WRAP INSTEAD!!    Current Outpatient Medications  Medication Sig Dispense Refill  . acetaminophen (TYLENOL) 500 MG tablet Take 500 mg by mouth every 8 (eight) hours as needed for mild pain. (pain/headaches)    . Amino Acids-Protein Hydrolys (FEEDING SUPPLEMENT, PRO-STAT SUGAR FREE 64,) LIQD Take 30 mLs by mouth 2 (two) times daily. 900 mL 0  . aspirin EC 81 MG tablet Take 81 mg by mouth at bedtime.    . calcium acetate (PHOSLO) 667 MG capsule Take 667-2,001 mg See admin instructions by mouth. Take 3 capsules (2001 mg) by mouth three times daily with meals, take 1 capsule (667 mg) with snacks    . collagenase (SANTYL) ointment Apply daily topically. 15 g 0  . ezetimibe (ZETIA) 10 MG tablet Take 1 tablet (10 mg total) by mouth daily. 30 tablet 1  . finasteride (PROSCAR) 5 MG tablet Take 1 tablet (5 mg total) by mouth daily. 30 tablet 1  .  gabapentin (NEURONTIN) 100 MG capsule Take one tablet in the morning and two tablets at night. 90 capsule 1  . glimepiride (AMARYL) 1 MG tablet Take 1 tablet (1 mg total) by mouth daily with breakfast. 30 tablet 1  . Melatonin 3 MG TABS Take 3 mg at bedtime by mouth.    . Nutritional Supplements (FEEDING SUPPLEMENT, NEPRO CARB STEADY,) LIQD Take 237 mLs by mouth 2 (two) times daily between meals.  0  . predniSONE (DELTASONE) 20 MG tablet Take 1 tablet (20 mg total) by mouth daily with breakfast. Please note to continue taking 20 mg tablet for now  and you must be evaluated by your neurologist (Dr. [name]) to determine next Prednisone dose and frequency. 7 tablet 1  . warfarin  (COUMADIN) 1 MG tablet 1 1/2 tab daily (Patient taking differently: Take 1 mg daily with supper by mouth. ) 30 tablet 1  . amoxicillin-clavulanate (AUGMENTIN) 875-125 MG tablet Take 1 tablet by mouth 2 (two) times daily. One po bid x 7 days (Patient not taking: Reported on 10/21/2017) 14 tablet 0  . ceFEPIme 2 g in dextrose 5 % 50 mL Inject 2 g every Monday, Wednesday, and Friday at 6 PM into the vein. (Patient not taking: Reported on 10/21/2017)    . famotidine (PEPCID) 40 MG/5ML suspension Take 2.5 mLs (20 mg total) by mouth at bedtime. (Patient not taking: Reported on 0/09/930) 50 mL 0  . folic acid-vitamin b complex-vitamin c-selenium-zinc (DIALYVITE) 3 MG TABS tablet Take 1 tablet at bedtime by mouth.    Marland Kitchen HYDROcodone-acetaminophen (NORCO/VICODIN) 5-325 MG tablet Take 1-2 tablets by mouth every 4 (four) hours as needed for moderate pain or severe pain. (Patient not taking: Reported on 10/21/2017) 20 tablet 0  . traZODone (DESYREL) 50 MG tablet Take 1 tablet (50 mg total) by mouth at bedtime as needed for sleep. (Patient not taking: Reported on 10/21/2017)     No current facility-administered medications for this visit.     REVIEW OF SYSTEMS:  [X]  denotes positive finding, [ ]  denotes negative finding Cardiac  Comments:  Chest pain or chest pressure:    Shortness of breath upon exertion:    Short of breath when lying flat:    Irregular heart rhythm: x       Vascular    Pain in calf, thigh, or hip brought on by ambulation:    Pain in feet at night that wakes you up from your sleep:     Blood clot in your veins:    Leg swelling:           PHYSICAL EXAM: Vitals:   10/21/17 0902  BP: 119/61  Pulse: 63  Resp: 20  Temp: 98.2 F (36.8 C)  TempSrc: Oral  SpO2: 95%  Weight: 177 lb (80.3 kg)  Height: 5\' 10"  (1.778 m)    GENERAL: The patient is a well-nourished male, in no acute distress. The vital signs are documented above. CARDIOVASCULAR: No palpable popliteal or distal  pulses PULMONARY: There is good air exchange  MUSCULOSKELETAL: There are no major deformities or cyanosis. NEUROLOGIC: No focal weakness or paresthesias are detected. SKIN: Extensive ulceration on the right lateral malleolus and also lateral foot.  The lateral foot has a dry eschar in the lateral malleolus has an open ulcer he does have a eschar on his left medial heel. PSYCHIATRIC: The patient has a normal affect.  DATA:  Noninvasive studies reveal calcification of his vessels therefore unreliable ankle arm index  MEDICAL ISSUES: I  had an extensive discussion with the patient and his wife present.  Extremely difficult management decisions.  He clearly has bilateral limb threatening ischemia.  He is nonambulatory and is able to provide little assistance in transfer I discussed the possibility of primary amputation.  A would rather not proceed in this direction which is understandable.  I have offered arteriography to determine if straightforward endovascular treatment is possible.  I explained that this would be unlikely.  I certainly would recommend against extensive bypasses for limb salvage since he is essentially nonambulatory and transfer he understands and we will get him on the schedule after holding his Coumadin.  Will attempt to obtain arteriogram this week.    Rosetta Posner, MD FACS [facility] Tel [phone] Pager [phone]

## 2017-10-21 NOTE — Progress Notes (Unsigned)
Patient instructed to go to Dr. Harrington Challenger office for blood draw to check INR. Sharee Pimple at office instructed to call this nurse back with results. Patient instructed to hold Coumadin for procedure 10/22/17. Dialysis days changed with [place] at [facility] to be done on Thursday. Patient and wife verbalized understanding. They will expect my call later with results of blood work.

## 2017-10-21 NOTE — Progress Notes (Signed)
Report of 10/21/17 INR 1.8/ per Dr. Harrington Challenger. May hold Coumadin and proceed with Aortogram for 10/22/17. Message give to Dr. Donnetta Hutching.

## 2017-10-21 NOTE — Telephone Encounter (Signed)
Call to patient's wife to be at [facility] hospital admitting at 10:30 am for procedure 10/22/17. May have a light high protein breakfast and fluids up till 6 am then NPO. Hold diabetic medication and can take pain pill for moderate to severe leg pain. To call if any questions.

## 2017-10-21 NOTE — Progress Notes (Signed)
[facility] Specialist of [place]  Patient name: James David MRN: [id number] DOB: 02/27/1936 Sex: male  REASON FOR VISIT: Evaluation bilateral lower extremity ulceration and limb threatening ischemia  HPI: James David is a 82 y.o. male here today for evaluation.  He is known to our practice from prior access for hemodialysis.  He has had a difficult year in 2018.  Had initiation of hemodialysis in May and then subsequent suffered a stroke in May.  Was diagnosed with myasthenia gravis in June.  He is essentially wheelchair-bound.  He is able to provide slight assistance with transfer but is mostly transfer from bed to chair.  Has had ulcerations on both lower extremities which is being treated at a wound center is seen today as a urgent add-on for evaluation of peripheral vascular occlusive disease.  He is here today with his wife.  I know her from prior uneventful right carotid endarterectomy 3 or 4 years ago.  He does report with some discomfort in both lower extremities but no true arterial rest pain.  On chronic Coumadin therapy for atrial fibrillation and does have a pacemaker.  Past Medical History:  Diagnosis Date  . Anemia 07/28/2017  . Atrial fibrillation ([place])    on Coumadin  . CHF (congestive heart failure) ([place])   . DDD (degenerative disc disease)   . Diabetes mellitus without complication ([place])   . ESRD (end stage renal disease) on dialysis ([place])   . Hyperlipidemia   . Hypertension   . Melanoma ([place]) 2010   removed at [facility]  . Second degree heart block    a. s/p STJ dual chamber PPM - Dr [name]  . Stroke [facility]) 01/2017    Family History  Problem Relation Age of Onset  . Pulmonary embolism Mother   . Diabetes Mother   . Heart attack Father   . Diabetes Brother     SOCIAL HISTORY: Social History   Tobacco Use  . Smoking status: Never Smoker  . Smokeless tobacco: Never Used  Substance Use Topics  . Alcohol use: No     Allergies  Allergen Reactions  . Amlodipine Besylate Swelling    Angioedema   . Lisinopril Other (See Comments)    Renal insufficiency  . Statins Other (See Comments)    Muscle aches  . Ropinirole Other (See Comments)    Causes excessive grogginess the following morning after taking  . Tape Other (See Comments)    SKIN IS THIN AND TAPE WILL ACTUALLY TEAR IT; PLEASE USE COBAN WRAP INSTEAD!!    Current Outpatient Medications  Medication Sig Dispense Refill  . acetaminophen (TYLENOL) 500 MG tablet Take 500 mg by mouth every 8 (eight) hours as needed for mild pain. (pain/headaches)    . Amino Acids-Protein Hydrolys (FEEDING SUPPLEMENT, PRO-STAT SUGAR FREE 64,) LIQD Take 30 mLs by mouth 2 (two) times daily. 900 mL 0  . aspirin EC 81 MG tablet Take 81 mg by mouth at bedtime.    . calcium acetate (PHOSLO) 667 MG capsule Take 667-2,001 mg See admin instructions by mouth. Take 3 capsules (2001 mg) by mouth three times daily with meals, take 1 capsule (667 mg) with snacks    . collagenase (SANTYL) ointment Apply daily topically. 15 g 0  . ezetimibe (ZETIA) 10 MG tablet Take 1 tablet (10 mg total) by mouth daily. 30 tablet 1  . finasteride (PROSCAR) 5 MG tablet Take 1 tablet (5 mg total) by mouth daily. 30 tablet 1  .  gabapentin (NEURONTIN) 100 MG capsule Take one tablet in the morning and two tablets at night. 90 capsule 1  . glimepiride (AMARYL) 1 MG tablet Take 1 tablet (1 mg total) by mouth daily with breakfast. 30 tablet 1  . Melatonin 3 MG TABS Take 3 mg at bedtime by mouth.    . Nutritional Supplements (FEEDING SUPPLEMENT, NEPRO CARB STEADY,) LIQD Take 237 mLs by mouth 2 (two) times daily between meals.  0  . predniSONE (DELTASONE) 20 MG tablet Take 1 tablet (20 mg total) by mouth daily with breakfast. Please note to continue taking 20 mg tablet for now  and you must be evaluated by your neurologist (Dr. [name]) to determine next Prednisone dose and frequency. 7 tablet 1  . warfarin  (COUMADIN) 1 MG tablet 1 1/2 tab daily (Patient taking differently: Take 1 mg daily with supper by mouth. ) 30 tablet 1  . amoxicillin-clavulanate (AUGMENTIN) 875-125 MG tablet Take 1 tablet by mouth 2 (two) times daily. One po bid x 7 days (Patient not taking: Reported on 10/21/2017) 14 tablet 0  . ceFEPIme 2 g in dextrose 5 % 50 mL Inject 2 g every Monday, Wednesday, and Friday at 6 PM into the vein. (Patient not taking: Reported on 10/21/2017)    . famotidine (PEPCID) 40 MG/5ML suspension Take 2.5 mLs (20 mg total) by mouth at bedtime. (Patient not taking: Reported on 4/0/9811) 50 mL 0  . folic acid-vitamin b complex-vitamin c-selenium-zinc (DIALYVITE) 3 MG TABS tablet Take 1 tablet at bedtime by mouth.    Marland Kitchen HYDROcodone-acetaminophen (NORCO/VICODIN) 5-325 MG tablet Take 1-2 tablets by mouth every 4 (four) hours as needed for moderate pain or severe pain. (Patient not taking: Reported on 10/21/2017) 20 tablet 0  . traZODone (DESYREL) 50 MG tablet Take 1 tablet (50 mg total) by mouth at bedtime as needed for sleep. (Patient not taking: Reported on 10/21/2017)     No current facility-administered medications for this visit.     REVIEW OF SYSTEMS:  [X]  denotes positive finding, [ ]  denotes negative finding Cardiac  Comments:  Chest pain or chest pressure:    Shortness of breath upon exertion:    Short of breath when lying flat:    Irregular heart rhythm: x       Vascular    Pain in calf, thigh, or hip brought on by ambulation:    Pain in feet at night that wakes you up from your sleep:     Blood clot in your veins:    Leg swelling:           PHYSICAL EXAM: Vitals:   10/21/17 0902  BP: 119/61  Pulse: 63  Resp: 20  Temp: 98.2 F (36.8 C)  TempSrc: Oral  SpO2: 95%  Weight: 177 lb (80.3 kg)  Height: 5\' 10"  (1.778 m)    GENERAL: The patient is a well-nourished male, in no acute distress. The vital signs are documented above. CARDIOVASCULAR: No palpable popliteal or distal  pulses PULMONARY: There is good air exchange  MUSCULOSKELETAL: There are no major deformities or cyanosis. NEUROLOGIC: No focal weakness or paresthesias are detected. SKIN: Extensive ulceration on the right lateral malleolus and also lateral foot.  The lateral foot has a dry eschar in the lateral malleolus has an open ulcer he does have a eschar on his left medial heel. PSYCHIATRIC: The patient has a normal affect.  DATA:  Noninvasive studies reveal calcification of his vessels therefore unreliable ankle arm index  MEDICAL ISSUES: I  had an extensive discussion with the patient and his wife present.  Extremely difficult management decisions.  He clearly has bilateral limb threatening ischemia.  He is nonambulatory and is able to provide little assistance in transfer I discussed the possibility of primary amputation.  A would rather not proceed in this direction which is understandable.  I have offered arteriography to determine if straightforward endovascular treatment is possible.  I explained that this would be unlikely.  I certainly would recommend against extensive bypasses for limb salvage since he is essentially nonambulatory and transfer he understands and we will get him on the schedule after holding his Coumadin.  Will attempt to obtain arteriogram this week.    Rosetta Posner, MD FACS [facility] Tel [phone] Pager [phone]

## 2017-10-22 ENCOUNTER — Ambulatory Visit (HOSPITAL_COMMUNITY)
Admission: RE | Disposition: A | Discharge status: Home / Self Care | Payer: Self-pay | Source: Ambulatory Visit | Attending: Vascular Surgery

## 2017-10-22 ENCOUNTER — Ambulatory Visit (HOSPITAL_COMMUNITY)
Admission: RE | Admit: 2017-10-22 | Discharge: 2017-10-22 | Disposition: A | Discharge status: Home / Self Care | Payer: Medicare Other | Source: Ambulatory Visit | Attending: Vascular Surgery | Admitting: Vascular Surgery

## 2017-10-22 DIAGNOSIS — I70243 Atherosclerosis of native arteries of left leg with ulceration of ankle: Secondary | ICD-10-CM | POA: Diagnosis not present

## 2017-10-22 DIAGNOSIS — L97329 Non-pressure chronic ulcer of left ankle with unspecified severity: Secondary | ICD-10-CM | POA: Diagnosis not present

## 2017-10-22 DIAGNOSIS — Z7901 Long term (current) use of anticoagulants: Secondary | ICD-10-CM | POA: Insufficient documentation

## 2017-10-22 DIAGNOSIS — E785 Hyperlipidemia, unspecified: Secondary | ICD-10-CM | POA: Diagnosis not present

## 2017-10-22 DIAGNOSIS — Z7952 Long term (current) use of systemic steroids: Secondary | ICD-10-CM | POA: Insufficient documentation

## 2017-10-22 DIAGNOSIS — I4891 Unspecified atrial fibrillation: Secondary | ICD-10-CM | POA: Insufficient documentation

## 2017-10-22 DIAGNOSIS — I132 Hypertensive heart and chronic kidney disease with heart failure and with stage 5 chronic kidney disease, or end stage renal disease: Secondary | ICD-10-CM | POA: Diagnosis not present

## 2017-10-22 DIAGNOSIS — E11621 Type 2 diabetes mellitus with foot ulcer: Secondary | ICD-10-CM | POA: Diagnosis not present

## 2017-10-22 DIAGNOSIS — I70233 Atherosclerosis of native arteries of right leg with ulceration of ankle: Secondary | ICD-10-CM | POA: Diagnosis not present

## 2017-10-22 DIAGNOSIS — E1151 Type 2 diabetes mellitus with diabetic peripheral angiopathy without gangrene: Secondary | ICD-10-CM | POA: Diagnosis present

## 2017-10-22 DIAGNOSIS — G7000 Myasthenia gravis without (acute) exacerbation: Secondary | ICD-10-CM | POA: Diagnosis not present

## 2017-10-22 DIAGNOSIS — Z7984 Long term (current) use of oral hypoglycemic drugs: Secondary | ICD-10-CM | POA: Insufficient documentation

## 2017-10-22 DIAGNOSIS — I509 Heart failure, unspecified: Secondary | ICD-10-CM | POA: Diagnosis not present

## 2017-10-22 DIAGNOSIS — Z950 Presence of cardiac pacemaker: Secondary | ICD-10-CM | POA: Diagnosis not present

## 2017-10-22 DIAGNOSIS — Z8673 Personal history of transient ischemic attack (TIA), and cerebral infarction without residual deficits: Secondary | ICD-10-CM | POA: Insufficient documentation

## 2017-10-22 DIAGNOSIS — N186 End stage renal disease: Secondary | ICD-10-CM | POA: Diagnosis not present

## 2017-10-22 DIAGNOSIS — I70244 Atherosclerosis of native arteries of left leg with ulceration of heel and midfoot: Secondary | ICD-10-CM | POA: Diagnosis not present

## 2017-10-22 DIAGNOSIS — Z993 Dependence on wheelchair: Secondary | ICD-10-CM | POA: Diagnosis not present

## 2017-10-22 DIAGNOSIS — L97319 Non-pressure chronic ulcer of right ankle with unspecified severity: Secondary | ICD-10-CM | POA: Diagnosis not present

## 2017-10-22 DIAGNOSIS — E1122 Type 2 diabetes mellitus with diabetic chronic kidney disease: Secondary | ICD-10-CM | POA: Insufficient documentation

## 2017-10-22 DIAGNOSIS — I739 Peripheral vascular disease, unspecified: Secondary | ICD-10-CM | POA: Diagnosis not present

## 2017-10-22 DIAGNOSIS — Z7982 Long term (current) use of aspirin: Secondary | ICD-10-CM | POA: Insufficient documentation

## 2017-10-22 DIAGNOSIS — L97429 Non-pressure chronic ulcer of left heel and midfoot with unspecified severity: Secondary | ICD-10-CM | POA: Diagnosis not present

## 2017-10-22 DIAGNOSIS — Z992 Dependence on renal dialysis: Secondary | ICD-10-CM | POA: Insufficient documentation

## 2017-10-22 HISTORY — PX: ABDOMINAL AORTOGRAM: CATH118222

## 2017-10-22 HISTORY — PX: LOWER EXTREMITY ANGIOGRAPHY: CATH118251

## 2017-10-22 LAB — POCT I-STAT, CHEM 8
BUN: 80 mg/dL — AB (ref 6–20)
CREATININE: 6.9 mg/dL — AB (ref 0.61–1.24)
Calcium, Ion: 1.18 mmol/L (ref 1.15–1.40)
Chloride: 95 mmol/L — ABNORMAL LOW (ref 101–111)
Glucose, Bld: 79 mg/dL (ref 65–99)
HEMATOCRIT: 35 % — AB (ref 39.0–52.0)
Hemoglobin: 11.9 g/dL — ABNORMAL LOW (ref 13.0–17.0)
POTASSIUM: 3.7 mmol/L (ref 3.5–5.1)
Sodium: 135 mmol/L (ref 135–145)
TCO2: 27 mmol/L (ref 22–32)

## 2017-10-22 LAB — GLUCOSE, CAPILLARY
GLUCOSE-CAPILLARY: 64 mg/dL — AB (ref 65–99)
Glucose-Capillary: 76 mg/dL (ref 65–99)
Glucose-Capillary: 74 mg/dL (ref 65–99)

## 2017-10-22 LAB — PROTIME-INR
INR: 1.83
Prothrombin Time: 21 seconds — ABNORMAL HIGH (ref 11.4–15.2)

## 2017-10-22 SURGERY — ABDOMINAL AORTOGRAM
Anesthesia: LOCAL

## 2017-10-22 MED ORDER — OXYCODONE HCL 5 MG PO TABS: 5.0000 mg | ORAL_TABLET | ORAL | Status: DC | PRN

## 2017-10-22 MED ORDER — LIDOCAINE HCL (PF) 1 % IJ SOLN
INTRAMUSCULAR | Status: DC | PRN
  Administered 2017-10-22: 18 mL

## 2017-10-22 MED ORDER — MORPHINE SULFATE (PF) 10 MG/ML IV SOLN: 2.0000 mg | INTRAVENOUS | Status: DC | PRN

## 2017-10-22 MED ORDER — LIDOCAINE HCL 1 % IJ SOLN
INTRAMUSCULAR | Status: AC
  Filled 2017-10-22: qty 20

## 2017-10-22 MED ORDER — SODIUM CHLORIDE 0.9% FLUSH: 3.0000 mL | INTRAVENOUS | Status: DC | PRN

## 2017-10-22 MED ORDER — LABETALOL HCL 5 MG/ML IV SOLN: 10.0000 mg | INTRAVENOUS | Status: DC | PRN

## 2017-10-22 MED ORDER — SODIUM CHLORIDE 0.9 % IV SOLN: 250.0000 mL | INTRAVENOUS | Status: DC | PRN

## 2017-10-22 MED ORDER — IODIXANOL 320 MG/ML IV SOLN
INTRAVENOUS | Status: DC | PRN
  Administered 2017-10-22: 135 mL via INTRAVENOUS

## 2017-10-22 MED ORDER — HYDRALAZINE HCL 20 MG/ML IJ SOLN: 5.0000 mg | INTRAMUSCULAR | Status: DC | PRN

## 2017-10-22 MED ORDER — HEPARIN (PORCINE) IN NACL 2-0.9 UNIT/ML-% IJ SOLN
INTRAMUSCULAR | Status: AC
  Filled 2017-10-22: qty 1000

## 2017-10-22 MED ORDER — HEPARIN (PORCINE) IN NACL 2-0.9 UNIT/ML-% IJ SOLN
INTRAMUSCULAR | Status: AC | PRN
  Administered 2017-10-22: 1000 mL

## 2017-10-22 MED ORDER — SODIUM CHLORIDE 0.9% FLUSH: 3.0000 mL | Freq: Two times a day (BID) | INTRAVENOUS | Status: DC

## 2017-10-22 SURGICAL SUPPLY — 11 items
CATH ANGIO 5F PIGTAIL 65CM (CATHETERS) ×3 IMPLANT
COVER PRB 48X5XTLSCP FOLD TPE (BAG) ×2 IMPLANT
COVER PROBE 5X48 (BAG) ×1
DEVICE CLOSURE PERCLS PRGLD 6F (VASCULAR PRODUCTS) ×2 IMPLANT
KIT PV (KITS) ×3 IMPLANT
PERCLOSE PROGLIDE 6F (VASCULAR PRODUCTS) ×3
SHEATH PINNACLE 5F 10CM (SHEATH) ×3 IMPLANT
SYR MEDRAD MARK V 150ML (SYRINGE) ×3 IMPLANT
TRANSDUCER W/STOPCOCK (MISCELLANEOUS) ×3 IMPLANT
TRAY PV CATH (CUSTOM PROCEDURE TRAY) ×3 IMPLANT
WIRE BENTSON .035X145CM (WIRE) ×3 IMPLANT

## 2017-10-22 NOTE — Interval H&P Note (Signed)
History and Physical Interval Note:  10/22/2017 12:18 PM  James David  has presented today for surgery, with the diagnosis of pvd w/ bilateral ulcers  The various methods of treatment have been discussed with the patient and family. After consideration of risks, benefits and other options for treatment, the patient has consented to  Procedure(s): ABDOMINAL AORTOGRAM W/LOWER EXTREMITY (N/A) as a surgical intervention .  The patient's history has been reviewed, patient examined, no change in status, stable for surgery.  I have reviewed the patient's chart and labs.  Questions were answered to the patient's satisfaction.     Ruta Hinds

## 2017-10-22 NOTE — Progress Notes (Signed)
Patient ID: James David, male   DOB: September 04, 1936, 82 y.o.   MRN: [id number] Reviewed the patient's arteriogram with Dr. Oneida Alar.  Discussed with the patient's wife. He has normal aortoiliac segments and widely patent superficial femoral arteries down to the level of the popliteal arteries.  There is moderate narrowing in the left popliteal artery but patency of both popliteal arteries.  He does have significant distal tibial disease with diffuse disease from the mid calf into the foot.  Explained no option for revascularization with diffuse small vessel disease into his feet bilaterally.  He does have a dry eschar on his left heel and dry eschar on his right lateral foot.  He has an open ulceration on his right lateral malleolus.  He will continue local wound care with Dr. Phylis Bougie at [facility] wound center.  Explained that he in all likelihood will eventually come to amputation if he is unable to currently is having no invasive infection and has pain that is controlled with Tylenol and an occasional narcotic.  He is not complaining of severe typical arterial rest pain.  Explained that we would proceed with if he has progressive tissue loss or pain.  He may be able to heal a below-knee amputation based on his arterial flow.  This would be a very limited benefit due to his essentially nonambulatory status.  Would make this final recommendation if he comes to amputation.  He will see [place] in the office if he has progressive indication for amputation.

## 2017-10-22 NOTE — Discharge Instructions (Signed)

## 2017-10-22 NOTE — Op Note (Addendum)
Procedure: Abdominal aortogram with bilateral lower extremity runoff, Proglide closure right common femoral artery  Preoperative diagnosis: Nonhealing wounds bilateral feet  Postoperative diagnosis: Same  Anesthesia: Local  Operative findings: #1 no significant aortoiliac femoral popliteal occlusive disease #2 bilateral tibial artery occlusive disease but has in-line flow bilaterally  Operative details: After obtaining informed consent, the patient was taken the PV lab.  The patient was placed in supine position Angio table.  Both groins were prepped and draped in usual sterile fashion.  Local anesthesia was then treated over the right common femoral artery.  An introducer needle was used to cannulate the right common femoral artery using ultrasound guidance.  An 66 Bentson wire was threaded up the abdominal aorta under fluoroscopic guidance.  A 5 French sheath was placed over the guidewire in the right common femoral artery.  This was thoroughly flushed with heparinized saline.  A 5 French pigtail catheter was advanced over the guidewire into the abdominal aorta.  An abdominal aortogram was then obtained in AP projection.  The left renal artery is small but patent.  The right renal artery is patent.  The infrarenal abdominal aorta is smooth and patent.  The left and right common external and internal iliac arteries are widely patent.  Next the pigtail catheter was pulled down just above the aortic bifurcation and and a pelvic angiogram was obtained.  This confirmed the above findings.  At this point bilateral lower extremity runoff views were obtained through the pigtail catheter.  In the left lower extremity, the left common femoral profunda femoris superficial femoral and popliteal arteries are all smooth and widely patent.  The anterior tibial artery occludes in the mid leg.  The posterior tibial artery occludes in the distal third of the leg.  The peroneal artery is patent throughout its entire  course with no significant stenosis and gives off anterior and posterior communicating branches to the left foot.  In the right lower extremity, the right common femoral superficial femoral and profunda femoris arteries are all widely patent.  The right popliteal artery is patent.  The anterior tibial artery is occluded just after its origin.  There is two-vessel runoff via the posterior tibial and peroneal arteries to the right foot.  There are 2 less than 2 mm length areas of narrowing in the posterior tibial artery approaching 90%.  Otherwise the vessel is patent.  At this point the pigtail catheter was removed over a guidewire.  An oblique view of the right groin was obtained to make sure we were well within the common femoral artery for a pro-glide closure.  Proglide suture device was then placed over the guidewire into the right common femoral artery.  There was good pulsatile backbleeding from this.  The footplate was deployed as well as the suture device.  The guidewire was advanced back through the Proglide and I snugged down the sutures to make sure that he had reasonable hemostasis.  The guidewire was then removed.  The sutures were then snugged once more.  There was reasonable hemostasis at this point.  Direct pressure was then held.  There was good hemostasis after this.  Patient tolerated the procedure well and there were no complications.  The patient was taken to the holding area in stable condition.  Operative management: The patient has in-line flow to both lower extremities.  Although he does have 2 very short areas of narrowing in the posterior tibial artery on the right leg I do not believe these are significantly  flow in limiting and any intervention on these would have minimal impact on his overall perfusion.  The angiogram films were seen and discussed with Dr. Donnetta Hutching at the time of the study and he agreed no intervention at this point.  Dr. early will discuss further with the patient  long-term management whether this involves amputation or continued observation.  Ruta Hinds, MD [facility] [place] Office: [phone] Pager: [phone]

## 2017-10-23 ENCOUNTER — Encounter (HOSPITAL_COMMUNITY): Payer: Self-pay | Admitting: Vascular Surgery

## 2017-10-23 ENCOUNTER — Ambulatory Visit (HOSPITAL_BASED_OUTPATIENT_CLINIC_OR_DEPARTMENT_OTHER): Payer: Medicare Other

## 2017-10-23 MED FILL — Heparin Sodium (Porcine) 2 Unit/ML in Sodium Chloride 0.9%: INTRAMUSCULAR | Qty: 1000 | Status: AC

## 2017-10-29 ENCOUNTER — Other Ambulatory Visit: Payer: Self-pay | Admitting: Family Medicine

## 2017-10-29 DIAGNOSIS — E041 Nontoxic single thyroid nodule: Secondary | ICD-10-CM

## 2017-11-04 ENCOUNTER — Inpatient Hospital Stay (HOSPITAL_COMMUNITY)
Admission: EM | Admit: 2017-11-04 | Discharge: 2017-11-12 | DRG: 637 | Disposition: A | Discharge status: Home Health | Payer: Medicare Other | Attending: Internal Medicine | Admitting: Internal Medicine

## 2017-11-04 ENCOUNTER — Emergency Department (HOSPITAL_COMMUNITY): Payer: Medicare Other

## 2017-11-04 ENCOUNTER — Other Ambulatory Visit: Payer: Self-pay | Admitting: Family Medicine

## 2017-11-04 ENCOUNTER — Encounter (HOSPITAL_COMMUNITY): Payer: Self-pay | Admitting: Emergency Medicine

## 2017-11-04 DIAGNOSIS — M868X7 Other osteomyelitis, ankle and foot: Secondary | ICD-10-CM | POA: Diagnosis present

## 2017-11-04 DIAGNOSIS — E1169 Type 2 diabetes mellitus with other specified complication: Secondary | ICD-10-CM | POA: Diagnosis present

## 2017-11-04 DIAGNOSIS — R627 Adult failure to thrive: Secondary | ICD-10-CM | POA: Diagnosis present

## 2017-11-04 DIAGNOSIS — Z7189 Other specified counseling: Secondary | ICD-10-CM

## 2017-11-04 DIAGNOSIS — Z8673 Personal history of transient ischemic attack (TIA), and cerebral infarction without residual deficits: Secondary | ICD-10-CM

## 2017-11-04 DIAGNOSIS — I10 Essential (primary) hypertension: Secondary | ICD-10-CM | POA: Diagnosis present

## 2017-11-04 DIAGNOSIS — E041 Nontoxic single thyroid nodule: Secondary | ICD-10-CM

## 2017-11-04 DIAGNOSIS — I4821 Permanent atrial fibrillation: Secondary | ICD-10-CM | POA: Diagnosis present

## 2017-11-04 DIAGNOSIS — L89610 Pressure ulcer of right heel, unstageable: Secondary | ICD-10-CM | POA: Diagnosis present

## 2017-11-04 DIAGNOSIS — Z992 Dependence on renal dialysis: Secondary | ICD-10-CM | POA: Diagnosis not present

## 2017-11-04 DIAGNOSIS — G7000 Myasthenia gravis without (acute) exacerbation: Secondary | ICD-10-CM | POA: Diagnosis present

## 2017-11-04 DIAGNOSIS — F0390 Unspecified dementia without behavioral disturbance: Secondary | ICD-10-CM | POA: Diagnosis present

## 2017-11-04 DIAGNOSIS — E1122 Type 2 diabetes mellitus with diabetic chronic kidney disease: Secondary | ICD-10-CM | POA: Diagnosis present

## 2017-11-04 DIAGNOSIS — E785 Hyperlipidemia, unspecified: Secondary | ICD-10-CM | POA: Diagnosis present

## 2017-11-04 DIAGNOSIS — Z833 Family history of diabetes mellitus: Secondary | ICD-10-CM

## 2017-11-04 DIAGNOSIS — M86171 Other acute osteomyelitis, right ankle and foot: Secondary | ICD-10-CM | POA: Diagnosis not present

## 2017-11-04 DIAGNOSIS — T148XXA Other injury of unspecified body region, initial encounter: Secondary | ICD-10-CM

## 2017-11-04 DIAGNOSIS — N2581 Secondary hyperparathyroidism of renal origin: Secondary | ICD-10-CM | POA: Diagnosis present

## 2017-11-04 DIAGNOSIS — M542 Cervicalgia: Secondary | ICD-10-CM

## 2017-11-04 DIAGNOSIS — E46 Unspecified protein-calorie malnutrition: Secondary | ICD-10-CM | POA: Diagnosis present

## 2017-11-04 DIAGNOSIS — L89514 Pressure ulcer of right ankle, stage 4: Secondary | ICD-10-CM | POA: Diagnosis present

## 2017-11-04 DIAGNOSIS — Z5320 Procedure and treatment not carried out because of patient's decision for unspecified reasons: Secondary | ICD-10-CM

## 2017-11-04 DIAGNOSIS — E1121 Type 2 diabetes mellitus with diabetic nephropathy: Secondary | ICD-10-CM | POA: Diagnosis present

## 2017-11-04 DIAGNOSIS — L03115 Cellulitis of right lower limb: Secondary | ICD-10-CM | POA: Diagnosis present

## 2017-11-04 DIAGNOSIS — L89620 Pressure ulcer of left heel, unstageable: Secondary | ICD-10-CM | POA: Diagnosis present

## 2017-11-04 DIAGNOSIS — Z950 Presence of cardiac pacemaker: Secondary | ICD-10-CM | POA: Diagnosis present

## 2017-11-04 DIAGNOSIS — Z79899 Other long term (current) drug therapy: Secondary | ICD-10-CM

## 2017-11-04 DIAGNOSIS — E0821 Diabetes mellitus due to underlying condition with diabetic nephropathy: Secondary | ICD-10-CM | POA: Diagnosis present

## 2017-11-04 DIAGNOSIS — N186 End stage renal disease: Secondary | ICD-10-CM

## 2017-11-04 DIAGNOSIS — Z85820 Personal history of malignant melanoma of skin: Secondary | ICD-10-CM

## 2017-11-04 DIAGNOSIS — Z7401 Bed confinement status: Secondary | ICD-10-CM

## 2017-11-04 DIAGNOSIS — I482 Chronic atrial fibrillation: Secondary | ICD-10-CM | POA: Diagnosis present

## 2017-11-04 DIAGNOSIS — Z515 Encounter for palliative care: Secondary | ICD-10-CM

## 2017-11-04 DIAGNOSIS — R278 Other lack of coordination: Secondary | ICD-10-CM | POA: Diagnosis present

## 2017-11-04 DIAGNOSIS — Z7984 Long term (current) use of oral hypoglycemic drugs: Secondary | ICD-10-CM

## 2017-11-04 DIAGNOSIS — L89890 Pressure ulcer of other site, unstageable: Secondary | ICD-10-CM | POA: Diagnosis present

## 2017-11-04 DIAGNOSIS — R251 Tremor, unspecified: Secondary | ICD-10-CM | POA: Diagnosis present

## 2017-11-04 DIAGNOSIS — I132 Hypertensive heart and chronic kidney disease with heart failure and with stage 5 chronic kidney disease, or end stage renal disease: Secondary | ICD-10-CM | POA: Diagnosis present

## 2017-11-04 DIAGNOSIS — I509 Heart failure, unspecified: Secondary | ICD-10-CM | POA: Diagnosis present

## 2017-11-04 DIAGNOSIS — R410 Disorientation, unspecified: Secondary | ICD-10-CM | POA: Diagnosis not present

## 2017-11-04 DIAGNOSIS — E1151 Type 2 diabetes mellitus with diabetic peripheral angiopathy without gangrene: Secondary | ICD-10-CM | POA: Diagnosis present

## 2017-11-04 DIAGNOSIS — Z7952 Long term (current) use of systemic steroids: Secondary | ICD-10-CM

## 2017-11-04 DIAGNOSIS — Z6822 Body mass index (BMI) 22.0-22.9, adult: Secondary | ICD-10-CM

## 2017-11-04 DIAGNOSIS — I739 Peripheral vascular disease, unspecified: Secondary | ICD-10-CM | POA: Diagnosis not present

## 2017-11-04 DIAGNOSIS — R413 Other amnesia: Secondary | ICD-10-CM | POA: Diagnosis not present

## 2017-11-04 DIAGNOSIS — Z888 Allergy status to other drugs, medicaments and biological substances status: Secondary | ICD-10-CM

## 2017-11-04 DIAGNOSIS — Z993 Dependence on wheelchair: Secondary | ICD-10-CM

## 2017-11-04 DIAGNOSIS — L089 Local infection of the skin and subcutaneous tissue, unspecified: Secondary | ICD-10-CM

## 2017-11-04 DIAGNOSIS — M869 Osteomyelitis, unspecified: Secondary | ICD-10-CM | POA: Diagnosis not present

## 2017-11-04 DIAGNOSIS — R4587 Impulsiveness: Secondary | ICD-10-CM | POA: Diagnosis not present

## 2017-11-04 DIAGNOSIS — Z91048 Other nonmedicinal substance allergy status: Secondary | ICD-10-CM

## 2017-11-04 DIAGNOSIS — D631 Anemia in chronic kidney disease: Secondary | ICD-10-CM | POA: Diagnosis present

## 2017-11-04 DIAGNOSIS — Z7901 Long term (current) use of anticoagulants: Secondary | ICD-10-CM

## 2017-11-04 DIAGNOSIS — R531 Weakness: Secondary | ICD-10-CM | POA: Diagnosis not present

## 2017-11-04 DIAGNOSIS — Z7982 Long term (current) use of aspirin: Secondary | ICD-10-CM

## 2017-11-04 DIAGNOSIS — E8889 Other specified metabolic disorders: Secondary | ICD-10-CM | POA: Diagnosis present

## 2017-11-04 DIAGNOSIS — Z008 Encounter for other general examination: Secondary | ICD-10-CM | POA: Diagnosis not present

## 2017-11-04 DIAGNOSIS — L0390 Cellulitis, unspecified: Secondary | ICD-10-CM | POA: Diagnosis present

## 2017-11-04 LAB — CBC WITH DIFFERENTIAL/PLATELET
Basophils Absolute: 0 10*3/uL (ref 0.0–0.1)
Basophils Relative: 0 %
Eosinophils Absolute: 0 10*3/uL (ref 0.0–0.7)
Eosinophils Relative: 0 %
HCT: 33.6 % — ABNORMAL LOW (ref 39.0–52.0)
Hemoglobin: 10.4 g/dL — ABNORMAL LOW (ref 13.0–17.0)
Lymphocytes Relative: 7 %
Lymphs Abs: 0.9 10*3/uL (ref 0.7–4.0)
MCH: 32.2 pg (ref 26.0–34.0)
MCHC: 31 g/dL (ref 30.0–36.0)
MCV: 104 fL — ABNORMAL HIGH (ref 78.0–100.0)
Monocytes Absolute: 0.8 10*3/uL (ref 0.1–1.0)
Monocytes Relative: 6 %
Neutro Abs: 11.6 10*3/uL — ABNORMAL HIGH (ref 1.7–7.7)
Neutrophils Relative %: 87 %
Platelets: 402 10*3/uL — ABNORMAL HIGH (ref 150–400)
RBC: 3.23 MIL/uL — ABNORMAL LOW (ref 4.22–5.81)
RDW: 17.7 % — ABNORMAL HIGH (ref 11.5–15.5)
WBC: 13.3 10*3/uL — ABNORMAL HIGH (ref 4.0–10.5)

## 2017-11-04 LAB — TSH: TSH: 0.484 u[IU]/mL (ref 0.350–4.500)

## 2017-11-04 LAB — COMPREHENSIVE METABOLIC PANEL
ALT: 23 U/L (ref 17–63)
AST: 22 U/L (ref 15–41)
Albumin: 2.4 g/dL — ABNORMAL LOW (ref 3.5–5.0)
Alkaline Phosphatase: 49 U/L (ref 38–126)
Anion gap: 17 — ABNORMAL HIGH (ref 5–15)
BUN: 54 mg/dL — ABNORMAL HIGH (ref 6–20)
CO2: 26 mmol/L (ref 22–32)
Calcium: 9.4 mg/dL (ref 8.9–10.3)
Chloride: 89 mmol/L — ABNORMAL LOW (ref 101–111)
Creatinine, Ser: 5.57 mg/dL — ABNORMAL HIGH (ref 0.61–1.24)
GFR calc Af Amer: 10 mL/min — ABNORMAL LOW (ref >60)
GFR calc non Af Amer: 9 mL/min — ABNORMAL LOW (ref >60)
Glucose, Bld: 353 mg/dL — ABNORMAL HIGH (ref 65–99)
Potassium: 3.8 mmol/L (ref 3.5–5.1)
Sodium: 132 mmol/L — ABNORMAL LOW (ref 135–145)
Total Bilirubin: 0.7 mg/dL (ref 0.3–1.2)
Total Protein: 6.7 g/dL (ref 6.5–8.1)

## 2017-11-04 LAB — I-STAT VENOUS BLOOD GAS, ED
Acid-Base Excess: 5 mmol/L — ABNORMAL HIGH (ref 0.0–2.0)
Bicarbonate: 29.4 mmol/L — ABNORMAL HIGH (ref 20.0–28.0)
O2 SAT: 81 %
PH VEN: 7.465 — AB (ref 7.250–7.430)
TCO2: 31 mmol/L (ref 22–32)
pCO2, Ven: 40.9 mmHg — ABNORMAL LOW (ref 44.0–60.0)
pO2, Ven: 43 mmHg (ref 32.0–45.0)

## 2017-11-04 LAB — I-STAT CG4 LACTIC ACID, ED: Lactic Acid, Venous: 1.36 mmol/L (ref 0.5–1.9)

## 2017-11-04 LAB — MAGNESIUM: Magnesium: 2.3 mg/dL (ref 1.7–2.4)

## 2017-11-04 LAB — PROTIME-INR
INR: 3
PROTHROMBIN TIME: 30.9 s — AB (ref 11.4–15.2)

## 2017-11-04 MED ORDER — FENOFIBRATE 160 MG PO TABS
160.0000 mg | ORAL_TABLET | Freq: Every day | ORAL | Status: DC
  Administered 2017-11-05 – 2017-11-12 (×8): 160 mg via ORAL
  Filled 2017-11-04 (×8): qty 1

## 2017-11-04 MED ORDER — LANTHANUM CARBONATE 500 MG PO CHEW
1000.0000 mg | CHEWABLE_TABLET | Freq: Three times a day (TID) | ORAL | Status: DC
  Administered 2017-11-05 – 2017-11-12 (×19): 1000 mg via ORAL
  Filled 2017-11-04 (×19): qty 2

## 2017-11-04 MED ORDER — ONDANSETRON HCL 4 MG/2ML IJ SOLN: 4.0000 mg | Freq: Four times a day (QID) | INTRAMUSCULAR | Status: DC | PRN

## 2017-11-04 MED ORDER — EZETIMIBE 10 MG PO TABS
10.0000 mg | ORAL_TABLET | Freq: Every day | ORAL | Status: DC
  Administered 2017-11-05 – 2017-11-12 (×8): 10 mg via ORAL
  Filled 2017-11-04 (×8): qty 1

## 2017-11-04 MED ORDER — ASPIRIN EC 81 MG PO TBEC: 81.0000 mg | DELAYED_RELEASE_TABLET | Freq: Every day | ORAL | Status: DC

## 2017-11-04 MED ORDER — PREDNISONE 20 MG PO TABS
20.0000 mg | ORAL_TABLET | Freq: Every day | ORAL | Status: DC
  Administered 2017-11-05 – 2017-11-12 (×8): 20 mg via ORAL
  Filled 2017-11-04 (×8): qty 1

## 2017-11-04 MED ORDER — MELATONIN 3 MG PO TABS
3.0000 mg | ORAL_TABLET | Freq: Every day | ORAL | Status: DC
  Administered 2017-11-05 – 2017-11-11 (×8): 3 mg via ORAL
  Filled 2017-11-04 (×9): qty 1

## 2017-11-04 MED ORDER — CARVEDILOL 25 MG PO TABS
25.0000 mg | ORAL_TABLET | Freq: Two times a day (BID) | ORAL | Status: DC
  Administered 2017-11-05: 12.5 mg via ORAL
  Administered 2017-11-05 – 2017-11-12 (×13): 25 mg via ORAL
  Filled 2017-11-04: qty 1
  Filled 2017-11-04 (×2): qty 2
  Filled 2017-11-04 (×8): qty 1
  Filled 2017-11-04: qty 2
  Filled 2017-11-04 (×2): qty 1

## 2017-11-04 MED ORDER — ACETAMINOPHEN 650 MG RE SUPP: 650.0000 mg | Freq: Four times a day (QID) | RECTAL | Status: DC | PRN

## 2017-11-04 MED ORDER — FINASTERIDE 5 MG PO TABS
5.0000 mg | ORAL_TABLET | Freq: Every day | ORAL | Status: DC
  Administered 2017-11-05 – 2017-11-12 (×8): 5 mg via ORAL
  Filled 2017-11-04 (×8): qty 1

## 2017-11-04 MED ORDER — INSULIN ASPART 100 UNIT/ML ~~LOC~~ SOLN
0.0000 [IU] | Freq: Three times a day (TID) | SUBCUTANEOUS | Status: DC
  Administered 2017-11-05: 5 [IU] via SUBCUTANEOUS
  Administered 2017-11-05: 2 [IU] via SUBCUTANEOUS
  Administered 2017-11-05: 3 [IU] via SUBCUTANEOUS
  Administered 2017-11-06 (×2): 1 [IU] via SUBCUTANEOUS
  Administered 2017-11-06: 7 [IU] via SUBCUTANEOUS
  Administered 2017-11-07: 3 [IU] via SUBCUTANEOUS
  Administered 2017-11-08: 7 [IU] via SUBCUTANEOUS
  Administered 2017-11-08: 9 [IU] via SUBCUTANEOUS
  Administered 2017-11-08: 1 [IU] via SUBCUTANEOUS
  Administered 2017-11-09: 7 [IU] via SUBCUTANEOUS
  Administered 2017-11-09: 5 [IU] via SUBCUTANEOUS
  Administered 2017-11-10 – 2017-11-11 (×3): 2 [IU] via SUBCUTANEOUS
  Administered 2017-11-11: 3 [IU] via SUBCUTANEOUS
  Administered 2017-11-12: 5 [IU] via SUBCUTANEOUS

## 2017-11-04 MED ORDER — CALCIUM ACETATE (PHOS BINDER) 667 MG PO CAPS
1334.0000 mg | ORAL_CAPSULE | Freq: Three times a day (TID) | ORAL | Status: DC
  Administered 2017-11-05 – 2017-11-08 (×9): 1334 mg via ORAL
  Filled 2017-11-04 (×9): qty 2

## 2017-11-04 MED ORDER — VANCOMYCIN HCL IN DEXTROSE 750-5 MG/150ML-% IV SOLN: 750.0000 mg | INTRAVENOUS | Status: DC

## 2017-11-04 MED ORDER — ACETAMINOPHEN 325 MG PO TABS
650.0000 mg | ORAL_TABLET | Freq: Four times a day (QID) | ORAL | Status: DC | PRN
  Administered 2017-11-05 – 2017-11-10 (×6): 650 mg via ORAL
  Filled 2017-11-04 (×5): qty 2

## 2017-11-04 MED ORDER — NEPRO/CARBSTEADY PO LIQD
237.0000 mL | ORAL | Status: DC
  Administered 2017-11-05 – 2017-11-12 (×3): 237 mL via ORAL

## 2017-11-04 MED ORDER — SODIUM CHLORIDE 0.9 % IV SOLN
1.0000 g | Freq: Once | INTRAVENOUS | Status: AC
  Administered 2017-11-04: 1 g via INTRAVENOUS
  Filled 2017-11-04: qty 1

## 2017-11-04 MED ORDER — GABAPENTIN 100 MG PO CAPS: 200.0000 mg | ORAL_CAPSULE | Freq: Every day | ORAL | Status: DC

## 2017-11-04 MED ORDER — METRONIDAZOLE 500 MG PO TABS
500.0000 mg | ORAL_TABLET | Freq: Once | ORAL | Status: AC
  Administered 2017-11-04: 500 mg via ORAL
  Filled 2017-11-04: qty 1

## 2017-11-04 MED ORDER — LINEZOLID 600 MG/300ML IV SOLN
600.0000 mg | Freq: Once | INTRAVENOUS | Status: AC
  Administered 2017-11-05: 600 mg via INTRAVENOUS
  Filled 2017-11-04 (×2): qty 300

## 2017-11-04 MED ORDER — ONDANSETRON HCL 4 MG PO TABS: 4.0000 mg | ORAL_TABLET | Freq: Four times a day (QID) | ORAL | Status: DC | PRN

## 2017-11-04 MED ORDER — SODIUM CHLORIDE 0.9 % IV SOLN
2.0000 g | INTRAVENOUS | Status: DC
  Filled 2017-11-04 (×2): qty 2

## 2017-11-04 MED ORDER — FA-PYRIDOXINE-CYANOCOBALAMIN 2.5-25-2 MG PO TABS
1.0000 | ORAL_TABLET | Freq: Every day | ORAL | Status: DC
  Administered 2017-11-05 – 2017-11-11 (×7): 1 via ORAL
  Filled 2017-11-04 (×8): qty 1

## 2017-11-04 MED ORDER — SODIUM CHLORIDE 0.9 % IV SOLN
1750.0000 mg | Freq: Once | INTRAVENOUS | Status: AC
  Administered 2017-11-04: 1750 mg via INTRAVENOUS
  Filled 2017-11-04: qty 1750

## 2017-11-04 NOTE — ED Provider Notes (Signed)
[facility] EMERGENCY DEPARTMENT Provider Note   CSN: [id number] Arrival date & time: 11/04/17  1317     History   Chief Complaint Chief Complaint  Patient presents with  . Weakness    HPI James David is a 82 y.o. male.  The history is provided by the patient and medical records. No language interpreter was used.  Illness  This is a new problem. The current episode started 12 to 24 hours ago. The problem occurs constantly. The problem has been gradually worsening. Pertinent negatives include no chest pain, no abdominal pain, no headaches and no shortness of breath. Nothing aggravates the symptoms. Nothing relieves the symptoms. He has tried nothing for the symptoms. The treatment provided no relief.    Past Medical History:  Diagnosis Date  . Anemia 07/28/2017  . Atrial fibrillation ([place])    on Coumadin  . CHF (congestive heart failure) ([place])   . DDD (degenerative disc disease)   . Diabetes mellitus without complication ([place])   . ESRD (end stage renal disease) on dialysis ([place])   . Hyperlipidemia   . Hypertension   . Melanoma ([place]) 2010   removed at [facility]  . Second degree heart block    a. s/p STJ dual chamber PPM - Dr [name]  . Stroke [facility] 01/2017    Patient Active Problem List   Diagnosis Date Noted  . Cervical stenosis of spine 08/12/2017  . UTI (urinary tract infection) 07/27/2017  . Weakness 07/27/2017  . Left elbow pain 07/27/2017  . Pressure injury of skin 06/11/2017  . Bleeding at insertion site 06/09/2017  . Myasthenia gravis ([place])   . Benign essential HTN   . Labile blood glucose   . Dysphagia   . Leukocytosis   . Acute respiratory failure with hypoxia ([place])   . Myasthenia gravis in crisis ([place]) 03/17/2017  . History of CVA in adulthood 03/17/2017  . Atrial fibrillation, permanent ([place]) 03/17/2017  . Supratherapeutic INR 03/17/2017  . Dyslipidemia 03/17/2017  . Anemia 03/17/2017  . Low back pain 02/28/2017  . Neck pain  02/28/2017  . Polyneuropathy 02/28/2017  . Gait disturbance, post-stroke   . Subcortical infarction ([place]) 02/06/2017  . Intracranial vascular stenosis 02/04/2017  . Syncope   . Orthostatic hypotension   . Stroke ([place]) 02/01/2017  . TIA (transient ischemic attack) 01/29/2017  . CKD (chronic kidney disease) stage V requiring chronic dialysis ([place]) 01/29/2017  . DDD (degenerative disc disease) 01/29/2017  . ESRD (end stage renal disease) on dialysis ([place])   . Malignant melanoma of arm ([place]) 03/13/2016  . Renal lesion 03/10/2016  . BPH (benign prostatic hyperplasia) 03/07/2016  . Pre-transplant evaluation for ESRD (end stage renal disease) 07/05/2015  . Pacemaker 10/13/2014  . CHF (congestive heart failure) ([place]) 05/25/2014  . History of melanoma 05/25/2014  . Chronic diastolic heart failure ([place]) 03/24/2014  . Second-degree heart block 12/13/2013  . Essential hypertension 12/13/2013  . Diabetes mellitus due to underlying condition with diabetic nephropathy ([place]) 12/13/2013  . Hyperlipidemia 12/13/2013    Past Surgical History:  Procedure Laterality Date  . A/V FISTULAGRAM N/A 06/11/2017   Procedure: A/V Fistulagram;  Surgeon: Waynetta Sandy, MD;  Location: [facility];  Service: Cardiovascular;  Laterality: N/A;  . ABDOMINAL AORTOGRAM N/A 10/22/2017   Procedure: ABDOMINAL AORTOGRAM;  Surgeon: Elam Dutch, MD;  Location: [facility];  Service: Cardiovascular;  Laterality: N/A;  . AV FISTULA PLACEMENT Right 05/31/2014   Procedure: Right Arm Brachiocephalic ARTERIOVENOUS  FISTULA CREATION  ;  Surgeon: Conrad Koloa, MD;  Location: [facility];  Service: Vascular;  Laterality: Right;  . CATARACT EXTRACTION W/ INTRAOCULAR LENS  IMPLANT, BILATERAL    . COLONOSCOPY    . EXCISION MELANOMA WITH SENTINEL LYMPH NODE BIOPSY Right 02/08/2016   Procedure: WIDE EXCISION RIGHT SHOULDER MELANOMA WITH RIGHT SENTINEL LYMPH NODE BIOPSY;  Surgeon: [name], MD;  Location: [facility];   Service: General;  Laterality: Right;  . LAMINECTOMY    . LOWER EXTREMITY ANGIOGRAPHY Bilateral 10/22/2017   Procedure: Lower Extremity Angiography;  Surgeon: Elam Dutch, MD;  Location: [facility];  Service: Cardiovascular;  Laterality: Bilateral;  . PERIPHERAL VASCULAR BALLOON ANGIOPLASTY Right 06/11/2017   Procedure: PERIPHERAL VASCULAR BALLOON ANGIOPLASTY;  Surgeon: Waynetta Sandy, MD;  Location: [facility];  Service: Cardiovascular;  Laterality: Right;  arm fistula  . PERMANENT PACEMAKER INSERTION N/A 01/20/2014   STJ Assurity dual chamber pacemaker implanted by Dr [name] for 2nd degree AV block       Home Medications    Prior to Admission medications   Medication Sig Start Date End Date Taking? Authorizing Provider  acetaminophen (TYLENOL) 500 MG tablet Take 500-1,000 mg by mouth daily as needed for moderate pain or headache.     [provider]  amoxicillin-clavulanate (AUGMENTIN) 875-125 MG tablet Take 1 tablet by mouth 2 (two) times daily. One po bid x 7 days Patient not taking: Reported on 10/21/2017 08/27/17   Charlesetta Shanks, MD  ARTIFICIAL TEAR OP Place 1 drop into both eyes daily.    [provider]  aspirin EC 81 MG tablet Take 81 mg by mouth at bedtime.    [provider]  calcium acetate (PHOSLO) 667 MG capsule Take 667-1,334 mg by mouth See admin instructions. Take 1334 mg by mouth three times daily with meals, take 1 capsule (667 mg) with snacks    [provider]  carvedilol (COREG) 25 MG tablet Take 25 mg by mouth 2 (two) times daily.    [provider]  ceFEPIme 2 g in dextrose 5 % 50 mL Inject 2 g every Monday, Wednesday, and Friday at 6 PM into the vein. Patient not taking: Reported on 10/21/2017 08/01/17   Geradine Girt, DO  collagenase (SANTYL) ointment Apply daily topically. Patient not taking: Reported on 10/21/2017 08/01/17   Geradine Girt, DO  ENSURE (ENSURE) Take 1 Can by mouth every  other day. On non dialysis days    [provider]  ezetimibe (ZETIA) 10 MG tablet Take 1 tablet (10 mg total) by mouth daily. 02/14/17   Angiulli, Lavon Paganini, PA-C  fenofibrate 160 MG tablet Take 160 mg by mouth daily.    [provider]  finasteride (PROSCAR) 5 MG tablet Take 1 tablet (5 mg total) by mouth daily. 02/14/17   Angiulli, Lavon Paganini, PA-C  folic acid-vitamin b complex-vitamin c-selenium-zinc (DIALYVITE) 3 MG TABS tablet Take 1 tablet at bedtime by mouth.    [provider]  gabapentin (NEURONTIN) 100 MG capsule Take one tablet in the morning and two tablets at night. 10/06/17   Sater, Nanine Means, MD  glimepiride (AMARYL) 1 MG tablet Take 1 tablet (1 mg total) by mouth daily with breakfast. Patient taking differently: Take 1-2 mg by mouth daily with breakfast. Take 2mg  once daily if blood sugar is 150 or higher 02/14/17   Angiulli, Lavon Paganini, PA-C  HYDROcodone-acetaminophen (NORCO/VICODIN) 5-325 MG tablet Take 1-2 tablets by mouth every 4 (  four) hours as needed for moderate pain or severe pain. Patient not taking: Reported on 10/21/2017 08/27/17   Charlesetta Shanks, MD  Melatonin 3 MG TABS Take 3 mg at bedtime by mouth.    [provider]  Nutritional Supplements (FEEDING SUPPLEMENT, NEPRO CARB STEADY,) LIQD Take 237 mLs by mouth 2 (two) times daily between meals. Patient taking differently: Take 237 mLs by mouth every Monday, Wednesday, and Friday. At dialysis 03/25/17   Theodis Blaze, MD  povidone-iodine (BETADINE) 10 % ointment Apply 1 application topically 3 (three) times a week.    [provider]  predniSONE (DELTASONE) 20 MG tablet Take 1 tablet (20 mg total) by mouth daily with breakfast. Please note to continue taking 20 mg tablet for now  and you must be evaluated by your neurologist (Dr. [name]) to determine next Prednisone dose and frequency. 03/26/17   Bonnielee Haff, MD  traMADol (ULTRAM) 50 MG tablet Take 50-100 mg by mouth 2 (two) times daily as  needed for moderate pain.    [provider]  warfarin (COUMADIN) 1 MG tablet 1 1/2 tab daily Patient taking differently: Take 1 mg by mouth daily with supper.  02/14/17   Angiulli, Lavon Paganini, PA-C    Family History Family History  Problem Relation Age of Onset  . Pulmonary embolism Mother   . Diabetes Mother   . Heart attack Father   . Diabetes Brother     Social History Social History   Tobacco Use  . Smoking status: Never Smoker  . Smokeless tobacco: Never Used  Substance Use Topics  . Alcohol use: No  . Drug use: No     Allergies   Amlodipine besylate; Lisinopril; Statins; Actos [pioglitazone]; Ropinirole; and Tape   Review of Systems Review of Systems  Constitutional: Positive for fatigue. Negative for chills, diaphoresis and fever.  HENT: Negative for congestion.   Eyes: Negative for visual disturbance.  Respiratory: Negative for cough, chest tightness, shortness of breath, wheezing and stridor.   Cardiovascular: Negative for chest pain, palpitations and leg swelling.  Gastrointestinal: Negative for abdominal pain, diarrhea, nausea and vomiting.  Genitourinary: Negative for flank pain.  Musculoskeletal: Negative for back pain, neck pain and neck stiffness.  Skin: Positive for wound.  Neurological: Negative for dizziness, light-headedness and headaches.  Psychiatric/Behavioral: Negative for agitation.  All other systems reviewed and are negative.    Physical Exam Updated Vital Signs Ht 6' (1.829 m)   Wt 79.4 kg (175 lb)   BMI 23.73 kg/m   Physical Exam  Constitutional: He is oriented to person, place, and time. He appears well-developed and well-nourished. No distress.  HENT:  Head: Normocephalic and atraumatic.  Mouth/Throat: Oropharynx is clear and moist. No oropharyngeal exudate.  Eyes: Conjunctivae and EOM are normal. Pupils are equal, round, and reactive to light.  Neck: Normal range of motion. Neck supple.  Cardiovascular: Normal rate.    No murmur heard. Pulmonary/Chest: Effort normal and breath sounds normal. No stridor. No respiratory distress. He has no wheezes. He exhibits no tenderness.  Abdominal: Soft. Bowel sounds are normal. He exhibits no distension. There is no tenderness.  Musculoskeletal: Normal range of motion. He exhibits no edema.       Right foot: There is tenderness.       Left foot: There is tenderness.       Feet:  Neurological: He is alert and oriented to person, place, and time. No cranial nerve deficit or sensory deficit. He exhibits normal muscle tone. Coordination  normal.  Skin: Capillary refill takes less than 2 seconds. He is not diaphoretic. No erythema. No pallor.  Psychiatric: He has a normal mood and affect.  Nursing note and vitals reviewed.             ED Treatments / Results  Labs (all labs ordered are listed, but only abnormal results are displayed) Labs Reviewed  COMPREHENSIVE METABOLIC PANEL - Abnormal; Notable for the following components:      Result Value   Sodium 132 (*)    Chloride 89 (*)    Glucose, Bld 353 (*)    BUN 54 (*)    Creatinine, Ser 5.57 (*)    Albumin 2.4 (*)    GFR calc non Af Amer 9 (*)    GFR calc Af Amer 10 (*)    Anion gap 17 (*)    All other components within normal limits  CBC WITH DIFFERENTIAL/PLATELET - Abnormal; Notable for the following components:   WBC 13.3 (*)    RBC 3.23 (*)    Hemoglobin 10.4 (*)    HCT 33.6 (*)    MCV 104.0 (*)    RDW 17.7 (*)    Platelets 402 (*)    Neutro Abs 11.6 (*)    All other components within normal limits  PROTIME-INR - Abnormal; Notable for the following components:   Prothrombin Time 30.9 (*)    All other components within normal limits  I-STAT VENOUS BLOOD GAS, ED - Abnormal; Notable for the following components:   pH, Ven 7.465 (*)    pCO2, Ven 40.9 (*)    Bicarbonate 29.4 (*)    Acid-Base Excess 5.0 (*)    All other components within normal limits  URINE CULTURE  TSH  MAGNESIUM   URINALYSIS, ROUTINE W REFLEX MICROSCOPIC  BASIC METABOLIC PANEL  CBC  PROTIME-INR  I-STAT CG4 LACTIC ACID, ED    EKG  EKG Interpretation  Date/Time:  Tuesday November 04 2017 15:50:37 EST Ventricular Rate:  90 PR Interval:    QRS Duration: 155 QT Interval:  440 QTC Calculation: 539 R Axis:   -84 Text Interpretation:  Paced rhythm similar to prior aside from t wave inversion in lead V2 No STEMI Confirmed by Antony Blackbird [phone]) on 11/04/2017 5:03:25 PM       Radiology Dg Chest 2 View  Result Date: 11/04/2017 CLINICAL DATA:  Fatigue, weakness, history of myasthenia gravis EXAM: CHEST  2 VIEW COMPARISON:  07/27/2017 FINDINGS: Surgical clips over the left chest. Left-sided pacing device, similar compared to previous exam. No significant pleural effusion. No acute consolidation. Stable enlarged cardiomediastinal silhouette with aortic atherosclerosis. No pneumothorax. Vascular stent in the right axillary region. Flowing osteophytes of the spine. IMPRESSION: No active cardiopulmonary disease.  Cardiomegaly. Electronically Signed   By: Donavan Foil M.D.   On: 11/04/2017 18:13   Dg Ankle 2 Views Left  Result Date: 11/04/2017 CLINICAL DATA:  Ankle pain EXAM: LEFT ANKLE - 2 VIEW COMPARISON:  None. FINDINGS: Only a single lateral view of the ankle is submitted. According to the notes, patient unable to hold position for AP view. Large plantar calcaneal spur and enthesophytes. Vascular calcification. No obvious acute abnormality IMPRESSION: 1. Limited single lateral view shows no acute abnormality 2. Large plantar calcaneal spur. Electronically Signed   By: Donavan Foil M.D.   On: 11/04/2017 18:42   Dg Ankle Complete Right  Result Date: 11/04/2017 CLINICAL DATA:  Worsening foot wounds EXAM: RIGHT ANKLE - COMPLETE 3+ VIEW COMPARISON:  06/28/2016  FINDINGS: No acute displaced fracture or malalignment is seen. Possible small erosion at the fibular malleolar tip on one view only. There is  lucency and thinning of the soft tissues over the fibular malleolus suspected to represent an ulcer. Mild degenerative changes of the medial ankle joint. Vascular calcifications. Large calcaneal spur and enthesophyte. Soft tissue thickening posterior calcaneal region. No soft tissue gas IMPRESSION: 1. Questionable small cortical erosion/focus of bone infection at the lateral fibular malleolar region on one view; there is thinning of the soft tissues and a lucency overlying the fibular malleolus, presumably representing an ulcer or wound. 2. Large plantar calcaneal spurs. Electronically Signed   By: Donavan Foil M.D.   On: 11/04/2017 18:19   Dg Foot Complete Left  Result Date: 11/04/2017 CLINICAL DATA:  Bilateral foot wounds right and left heel EXAM: LEFT FOOT - COMPLETE 3+ VIEW COMPARISON:  None. FINDINGS: No acute fracture or malalignment. Moderate arthritis at the first MTP joint. Screw fixation of the head of fifth metatarsal with chronic deformity noted. Vascular calcifications. Large calcaneal spurs and enthesophyte. No soft tissue gas. No radiopaque foreign body IMPRESSION: 1. No acute osseous abnormality 2. Postsurgical changes of the distal fifth metatarsal with evidence of old fracture deformity 3. Large calcaneal spurs Electronically Signed   By: Donavan Foil M.D.   On: 11/04/2017 18:15   Dg Foot Complete Right  Result Date: 11/04/2017 CLINICAL DATA:  Foot wounds EXAM: RIGHT FOOT COMPLETE - 3+ VIEW COMPARISON:  None. FINDINGS: No acute displaced fracture or malalignment is seen. Extensive vascular calcifications. Possible old fracture deformity involving the proximal shaft and base of the third metatarsal. Large plantar calcaneal spur. No soft tissue gas. IMPRESSION: 1. No acute osseous abnormality 2. Large plantar calcaneal spur Electronically Signed   By: Donavan Foil M.D.   On: 11/04/2017 18:23    Procedures Procedures (including critical care time)  Medications Ordered in  ED Medications  ceFEPIme (MAXIPIME) 2 g in sodium chloride 0.9 % 100 mL IVPB (not administered)  aspirin EC tablet 81 mg (not administered)  calcium acetate (PHOSLO) capsule 1,334 mg (not administered)  carvedilol (COREG) tablet 25 mg (not administered)  ezetimibe (ZETIA) tablet 10 mg (not administered)  fenofibrate tablet 160 mg (not administered)  finasteride (PROSCAR) tablet 5 mg (not administered)  folic acid-pyridoxine-cyancobalamin (FOLTX) 2.5-25-2 MG per tablet 1 tablet (not administered)  lanthanum (FOSRENOL) chewable tablet 1,000 mg (not administered)  Melatonin TABS 3 mg (not administered)  feeding supplement (NEPRO CARB STEADY) liquid 237 mL (not administered)  predniSONE (DELTASONE) tablet 20 mg (not administered)  acetaminophen (TYLENOL) tablet 650 mg (not administered)    Or  acetaminophen (TYLENOL) suppository 650 mg (not administered)  ondansetron (ZOFRAN) tablet 4 mg (not administered)    Or  ondansetron (ZOFRAN) injection 4 mg (not administered)  insulin aspart (novoLOG) injection 0-9 Units (not administered)  linezolid (ZYVOX) IVPB 600 mg (not administered)  gabapentin (NEURONTIN) capsule 100 mg (not administered)    And  gabapentin (NEURONTIN) capsule 200 mg (not administered)  metroNIDAZOLE (FLAGYL) tablet 500 mg (500 mg Oral Given 11/04/17 2047)  vancomycin (VANCOCIN) 1,750 mg in sodium chloride 0.9 % 500 mL IVPB (0 mg Intravenous Stopped 11/04/17 2127)  ceFEPIme (MAXIPIME) 1 g in sodium chloride 0.9 % 100 mL IVPB (0 g Intravenous Stopped 11/04/17 2207)     Initial Impression / Assessment and Plan / ED Course  I have reviewed the triage vital signs and the nursing notes.  Pertinent labs & imaging results that were  available during my care of the patient were reviewed by me and considered in my medical decision making (see chart for details).     James David is a 82 y.o. male with a past medical history significant for hypertension, CHF who is pacemaker  dependent, atrial fibrillation on Coumadin therapy, prior stroke, ESRD with dialysis M/W/F, diabetes, and myasthenia gravis who presents with abrupt fatigue and weakness today.  Patient reports that he took his normal dialysis treatment yesterday and otherwise been doing well.  He says that throughout the day he is developed severe fatigue and generalized weakness and is unable to ambulate.  He reports that this seems like a myasthenia crisis and development.  He specifically denies any fevers, chills, changes in urine that he is somewhat makes, conservation, diarrhea, nausea, vomiting, congestion, rhinorrhea, or productive cough.  He reports no recent medication changes or new diet changes.  He thinks he is been eating and drinking normally.  He denies any feeling short of breath or respiratory discomfort however he is most concerned about this being the start of a myasthenia crisis.  On exam, patient has clear lungs.  Abdomen is nontender.  Chest is nontender.  Patient had no focal neurologic deficits but he does have very cachectic legs.  Patient has wounds on his bilateral feet that show purulence and foul smell. See photos.  Patient had normal extraocular movements.  No back tenderness.  Based on patient's description of symptoms I am concerned about developing myasthenia crisis.  He does not have any swallowing difficulty or bulbar problems however this could be the start of a crisis.  Patient will have workup to look for electrode rales, dehydration, or occult infections which may have contributed.  Anticipate reassessment after workup however we will likely speak to neurology to determine disposition.  4:06 PM Patient's wife arrived and provided further history.  She reports that last year, patient had a myasthenic crisis that started similarly with generalized weakness which then led to him being intubated in the ICU with respiratory failure for several days.  She is also concerned about this.  She  also is concerned about his legs.  She reports that he has not walked in many months we use a wheelchair however today it is not been able to hold his head up and has been too fatigued to even sit up.  This raises concern for trunk weakness and neck weakness with myasthenia.    The patient's wounds were inspected in his heels and wife reports that it looks much more red and dark.  There was purulence on the right lateral ankle raise concern for ongoing infection with purulence.  Patient with x-rays to look for osteomyelitis.  Suspect that the skin infections in the feet might be the source of his developing crisis.  7:10 PM Patient's diagnostic testing results are seen above.  Lactic acid not elevated.  Magnesium normal.  Patient was found to have a leukocytosis and anemia.  TSH was normal.  Chest x-ray shows no pneumonia however x-rays of the ankles show possible right ankle cortical erosion/focus of bone infection at the site of the purulent and foul-smelling wound.  Given this finding, his white count, I am concerned this is the cause of his weakness.   Pharmacy was called to make sure the patient has appropriate antibiotics given his myasthenia gravis diagnosis.  Pharmacy recommended Vanco, cefepime, and Flagyl for antibiotic coverage.  Patient will need to be admitted to hospitalist service to stepdown level  of care with NIF testing every 6-8 hours to monitor for worsening respiratory status given his prior intubation during this infection.  9:27 PM After further review of antibiotics for the patient's infection, decision was made to discontinue the vancomycin as some documentation supports it may be a precipitant of myasthenia crisis.  Will defer further antibiotic management to admitting team.     Final Clinical Impressions(s) / ED Diagnoses   Final diagnoses:  Generalized weakness  Myasthenia gravis [facility])    ED Discharge Orders    None      Clinical Impression: 1. Generalized  weakness   2. Wound infection   3. Myasthenia gravis ([place])     Disposition: Admit  This note was prepared with assistance of Systems analyst. Occasional wrong-word or sound-a-like substitutions may have occurred due to the inherent limitations of voice recognition software.     Reynald Woods, Gwenyth Allegra, MD 11/05/17 [phone]

## 2017-11-04 NOTE — Progress Notes (Signed)
Pt. Performed > -40 with the NIF with good effort.

## 2017-11-04 NOTE — H&P (Signed)
History and Physical    James David:[id number] DOB: [birth year] DOA: 11/04/2017  PCP: [name], MD  Patient coming from: Home.  Chief Complaint: Weakness.  HPI: James David is a 82 y.o. male with history of myasthenia gravis on prednisone, diabetes mellitus, ESRD on hemodialysis on Monday Wednesday and Friday, atrial fibrillation, anemia, chronic lower extremity wounds who had recent arteriogram done by Dr. Oneida Alar presents to the ER with complaints of increasing weakness since morning.  Patient states that he was feeling generally weak.  Denies any difficulties breathing or swallowing or any diplopia.  Patient also has benign chronic lower extremity wounds with ulceration.  ED Course: In the ER patient has generalized weakness.  Appears nonfocal.  Patient's wounds of the lower extremity particularly on the right lower extremity lateral malleolus had increasing discharge with leg looking erythematous.  Patient is being admitted for cellulitis of the lower extremity and wounds with increasing weakness.  Neurology was consulted for the patient's weakness given the history of myasthenia gravis and neurology feels patient's weakness is not conducive with myasthenia gravis crisis.  Review of Systems: As per HPI, rest all negative.   Past Medical History:  Diagnosis Date  . Anemia 07/28/2017  . Atrial fibrillation ([place])    on Coumadin  . CHF (congestive heart failure) ([place])   . DDD (degenerative disc disease)   . Diabetes mellitus without complication ([place])   . ESRD (end stage renal disease) on dialysis ([place])   . Hyperlipidemia   . Hypertension   . Melanoma ([place]) 2010   removed at [facility]  . Second degree heart block    a. s/p STJ dual chamber PPM - Dr [name]  . Stroke [facility]) 01/2017    Past Surgical History:  Procedure Laterality Date  . A/V FISTULAGRAM N/A 06/11/2017   Procedure: A/V Fistulagram;  Surgeon: Waynetta Sandy, MD;  Location: [place] CV LAB;   Service: Cardiovascular;  Laterality: N/A;  . ABDOMINAL AORTOGRAM N/A 10/22/2017   Procedure: ABDOMINAL AORTOGRAM;  Surgeon: Elam Dutch, MD;  Location: [place] CV LAB;  Service: Cardiovascular;  Laterality: N/A;  . AV FISTULA PLACEMENT Right 05/31/2014   Procedure: Right Arm Brachiocephalic ARTERIOVENOUS FISTULA CREATION  ;  Surgeon: Conrad Watchung, MD;  Location: [place];  Service: Vascular;  Laterality: Right;  . CATARACT EXTRACTION W/ INTRAOCULAR LENS  IMPLANT, BILATERAL    . COLONOSCOPY    . EXCISION MELANOMA WITH SENTINEL LYMPH NODE BIOPSY Right 02/08/2016   Procedure: WIDE EXCISION RIGHT SHOULDER MELANOMA WITH RIGHT SENTINEL LYMPH NODE BIOPSY;  Surgeon: [name], MD;  Location: [place];  Service: General;  Laterality: Right;  . LAMINECTOMY    . LOWER EXTREMITY ANGIOGRAPHY Bilateral 10/22/2017   Procedure: Lower Extremity Angiography;  Surgeon: Elam Dutch, MD;  Location: [place] CV LAB;  Service: Cardiovascular;  Laterality: Bilateral;  . PERIPHERAL VASCULAR BALLOON ANGIOPLASTY Right 06/11/2017   Procedure: PERIPHERAL VASCULAR BALLOON ANGIOPLASTY;  Surgeon: Waynetta Sandy, MD;  Location: [place] CV LAB;  Service: Cardiovascular;  Laterality: Right;  arm fistula  . PERMANENT PACEMAKER INSERTION N/A 01/20/2014   STJ Assurity dual chamber pacemaker implanted by Dr [name] for 2nd degree AV block     reports that  has never smoked. he has never used smokeless tobacco. He reports that he does not drink alcohol or use drugs.  Allergies  Allergen Reactions  . Amlodipine Besylate Swelling    Angioedema   . Lisinopril Other (See Comments)  Renal insufficiency  . Statins Other (See Comments)    Muscle aches  . Actos [Pioglitazone]     Chest pain  . Ropinirole Other (See Comments)    Causes excessive grogginess the following morning after taking  . Tape Other (See Comments)    SKIN IS THIN AND TAPE WILL ACTUALLY TEAR IT; PLEASE USE COBAN WRAP INSTEAD!!     Family History  Problem Relation Age of Onset  . Pulmonary embolism Mother   . Diabetes Mother   . Heart attack Father   . Diabetes Brother     Prior to Admission medications   Medication Sig Start Date End Date Taking? Authorizing Provider  acetaminophen (TYLENOL) 500 MG tablet Take 500-1,000 mg by mouth daily as needed for moderate pain or headache.    Yes [provider]  ARTIFICIAL TEAR OP Place 1 drop into both eyes daily.   Yes [provider]  aspirin EC 81 MG tablet Take 81 mg by mouth at bedtime.   Yes [provider]  calcium acetate (PHOSLO) 667 MG capsule Take 667-1,334 mg by mouth See admin instructions. Take 1334 mg by mouth three times daily with meals, take 1 capsule (667 mg) with snacks   Yes [provider]  carvedilol (COREG) 25 MG tablet Take 25 mg by mouth 2 (two) times daily.   Yes [provider]  ENSURE (ENSURE) Take 1 Can by mouth every other day. On non dialysis days   Yes [provider]  ezetimibe (ZETIA) 10 MG tablet Take 1 tablet (10 mg total) by mouth daily. 02/14/17  Yes Angiulli, Lavon Paganini, PA-C  fenofibrate 160 MG tablet Take 160 mg by mouth daily.   Yes [provider]  finasteride (PROSCAR) 5 MG tablet Take 1 tablet (5 mg total) by mouth daily. 02/14/17  Yes Angiulli, Lavon Paganini, PA-C  folic acid-vitamin b complex-vitamin c-selenium-zinc (DIALYVITE) 3 MG TABS tablet Take 1 tablet at bedtime by mouth.   Yes [provider]  gabapentin (NEURONTIN) 100 MG capsule Take one tablet in the morning and two tablets at night. 10/06/17  Yes Sater, Nanine Means, MD  glimepiride (AMARYL) 1 MG tablet Take 1 tablet (1 mg total) by mouth daily with breakfast. Patient taking differently: Take 1-2 mg by mouth daily with breakfast. Take 2mg  once daily if blood sugar is 150 or higher 02/14/17  Yes Angiulli, Lavon Paganini, PA-C  lanthanum (FOSRENOL) 1000 MG chewable tablet Chew 1,000 mg by mouth 3 (three) times daily  with meals.   Yes [provider]  Melatonin 3 MG TABS Take 3 mg at bedtime by mouth.   Yes [provider]  Nutritional Supplements (FEEDING SUPPLEMENT, NEPRO CARB STEADY,) LIQD Take 237 mLs by mouth 2 (two) times daily between meals. Patient taking differently: Take 237 mLs by mouth every Monday, Wednesday, and Friday. At dialysis 03/25/17  Yes Theodis Blaze, MD  povidone-iodine (BETADINE) 10 % ointment Apply 1 application topically 3 (three) times a week.   Yes [provider]  predniSONE (DELTASONE) 20 MG tablet Take 1 tablet (20 mg total) by mouth daily with breakfast. Please note to continue taking 20 mg tablet for now  and you must be evaluated by your neurologist (Dr. [name]) to determine next Prednisone dose and frequency. 03/26/17  Yes Bonnielee Haff, MD  traMADol (ULTRAM) 50 MG tablet Take 50-100 mg by mouth 2 (two) times daily as needed for moderate pain.   Yes [provider]  warfarin (COUMADIN) 1 MG  tablet 1 1/2 tab daily Patient taking differently: Take 1 mg by mouth daily with supper.  02/14/17  Yes Angiulli, Lavon Paganini, PA-C  amoxicillin-clavulanate (AUGMENTIN) 875-125 MG tablet Take 1 tablet by mouth 2 (two) times daily. One po bid x 7 days Patient not taking: Reported on 10/21/2017 08/27/17   Charlesetta Shanks, MD  ceFEPIme 2 g in dextrose 5 % 50 mL Inject 2 g every Monday, Wednesday, and Friday at 6 PM into the vein. Patient not taking: Reported on 10/21/2017 08/01/17   Geradine Girt, DO  collagenase (SANTYL) ointment Apply daily topically. Patient not taking: Reported on 10/21/2017 08/01/17   Geradine Girt, DO  HYDROcodone-acetaminophen (NORCO/VICODIN) 5-325 MG tablet Take 1-2 tablets by mouth every 4 (four) hours as needed for moderate pain or severe pain. Patient not taking: Reported on 10/21/2017 08/27/17   Charlesetta Shanks, MD    Physical Exam: Vitals:   11/04/17 1915 11/04/17 2030 11/04/17 2100 11/04/17 2155  BP:  (!) 158/59 (!) 155/71 (!)  158/66  Pulse: 86 86 89 89  Resp: 19 16 17 14   SpO2: 100% 98% 100% 99%  Weight:      Height:          Constitutional: Moderately built and nourished. Vitals:   11/04/17 1915 11/04/17 2030 11/04/17 2100 11/04/17 2155  BP:  (!) 158/59 (!) 155/71 (!) 158/66  Pulse: 86 86 89 89  Resp: 19 16 17 14   SpO2: 100% 98% 100% 99%  Weight:      Height:       Eyes: Anicteric no pallor. ENMT: No discharge from the ears eyes nose or mouth. Neck: No mass felt.  No neck rigidity. Respiratory: No rhonchi or crepitations. Cardiovascular: S1-S2 heard. Abdomen: Soft nontender bowel sounds present. Musculoskeletal: No edema.  No joint effusion. Skin: Multiple necrotic looking locations in the both lower extremities.  There is ulceration on the lateral malleolus of the right leg which looks infected with purulent discharge. Neurologic: Alert awake oriented to time place and person.  Generally weak but able to move all extremities. Psychiatric: Appears normal.  Normal affect.   Labs on Admission: I have personally reviewed following labs and imaging studies  CBC: Recent Labs  Lab 11/04/17 1500  WBC 13.3*  NEUTROABS 11.6*  HGB 10.4*  HCT 33.6*  MCV 104.0*  PLT 956*   Basic Metabolic Panel: Recent Labs  Lab 11/04/17 1500  NA 132*  K 3.8  CL 89*  CO2 26  GLUCOSE 353*  BUN 54*  CREATININE 5.57*  CALCIUM 9.4  MG 2.3   GFR: Estimated Creatinine Clearance: 11.8 mL/min (A) (by C-G formula based on SCr of 5.57 mg/dL (H)). Liver Function Tests: Recent Labs  Lab 11/04/17 1500  AST 22  ALT 23  ALKPHOS 49  BILITOT 0.7  PROT 6.7  ALBUMIN 2.4*   No results for input(s): LIPASE, AMYLASE in the last 168 hours. No results for input(s): AMMONIA in the last 168 hours. Coagulation Profile: Recent Labs  Lab 11/04/17 1500  INR 3.00   Cardiac Enzymes: No results for input(s): CKTOTAL, CKMB, CKMBINDEX, TROPONINI in the last 168 hours. BNP (last 3 results) No results for input(s):  PROBNP in the last 8760 hours. HbA1C: No results for input(s): HGBA1C in the last 72 hours. CBG: No results for input(s): GLUCAP in the last 168 hours. Lipid Profile: No results for input(s): CHOL, HDL, LDLCALC, TRIG, CHOLHDL, LDLDIRECT in the last 72 hours. Thyroid Function Tests: Recent Labs    11/04/17  1703  TSH 0.484   Anemia Panel: No results for input(s): VITAMINB12, FOLATE, FERRITIN, TIBC, IRON, RETICCTPCT in the last 72 hours. Urine analysis:    Component Value Date/Time   COLORURINE YELLOW 07/27/2017 1609   COLORURINE AMBER (A) 07/27/2017 1609   APPEARANCEUR TURBID (A) 07/27/2017 1609   APPEARANCEUR TURBID (A) 07/27/2017 1609   LABSPEC 1.010 07/27/2017 1609   LABSPEC 1.010 07/27/2017 1609   PHURINE 8.0 07/27/2017 1609   PHURINE 8.0 07/27/2017 1609   GLUCOSEU >=500 (A) 07/27/2017 1609   GLUCOSEU >=500 (A) 07/27/2017 1609   HGBUR SMALL (A) 07/27/2017 1609   HGBUR SMALL (A) 07/27/2017 1609   BILIRUBINUR NEGATIVE 07/27/2017 1609   BILIRUBINUR NEGATIVE 07/27/2017 1609   KETONESUR NEGATIVE 07/27/2017 1609   KETONESUR NEGATIVE 07/27/2017 1609   PROTEINUR 100 (A) 07/27/2017 1609   PROTEINUR 100 (A) 07/27/2017 1609   NITRITE NEGATIVE 07/27/2017 1609   NITRITE NEGATIVE 07/27/2017 1609   LEUKOCYTESUR LARGE (A) 07/27/2017 1609   LEUKOCYTESUR LARGE (A) 07/27/2017 1609   Sepsis Labs: @LABRCNTIP (procalcitonin:4,lacticidven:4) )No results found for this or any previous visit (from the past 240 hour(s)).   Radiological Exams on Admission: Dg Chest 2 View  Result Date: 11/04/2017 CLINICAL DATA:  Fatigue, weakness, history of myasthenia gravis EXAM: CHEST  2 VIEW COMPARISON:  07/27/2017 FINDINGS: Surgical clips over the left chest. Left-sided pacing device, similar compared to previous exam. No significant pleural effusion. No acute consolidation. Stable enlarged cardiomediastinal silhouette with aortic atherosclerosis. No pneumothorax. Vascular stent in the right axillary  region. Flowing osteophytes of the spine. IMPRESSION: No active cardiopulmonary disease.  Cardiomegaly. Electronically Signed   By: Donavan Foil M.D.   On: 11/04/2017 18:13   Dg Ankle 2 Views Left  Result Date: 11/04/2017 CLINICAL DATA:  Ankle pain EXAM: LEFT ANKLE - 2 VIEW COMPARISON:  None. FINDINGS: Only a single lateral view of the ankle is submitted. According to the notes, patient unable to hold position for AP view. Large plantar calcaneal spur and enthesophytes. Vascular calcification. No obvious acute abnormality IMPRESSION: 1. Limited single lateral view shows no acute abnormality 2. Large plantar calcaneal spur. Electronically Signed   By: Donavan Foil M.D.   On: 11/04/2017 18:42   Dg Ankle Complete Right  Result Date: 11/04/2017 CLINICAL DATA:  Worsening foot wounds EXAM: RIGHT ANKLE - COMPLETE 3+ VIEW COMPARISON:  06/28/2016 FINDINGS: No acute displaced fracture or malalignment is seen. Possible small erosion at the fibular malleolar tip on one view only. There is lucency and thinning of the soft tissues over the fibular malleolus suspected to represent an ulcer. Mild degenerative changes of the medial ankle joint. Vascular calcifications. Large calcaneal spur and enthesophyte. Soft tissue thickening posterior calcaneal region. No soft tissue gas IMPRESSION: 1. Questionable small cortical erosion/focus of bone infection at the lateral fibular malleolar region on one view; there is thinning of the soft tissues and a lucency overlying the fibular malleolus, presumably representing an ulcer or wound. 2. Large plantar calcaneal spurs. Electronically Signed   By: Donavan Foil M.D.   On: 11/04/2017 18:19   Dg Foot Complete Left  Result Date: 11/04/2017 CLINICAL DATA:  Bilateral foot wounds right and left heel EXAM: LEFT FOOT - COMPLETE 3+ VIEW COMPARISON:  None. FINDINGS: No acute fracture or malalignment. Moderate arthritis at the first MTP joint. Screw fixation of the head of fifth metatarsal  with chronic deformity noted. Vascular calcifications. Large calcaneal spurs and enthesophyte. No soft tissue gas. No radiopaque foreign body IMPRESSION: 1. No acute  osseous abnormality 2. Postsurgical changes of the distal fifth metatarsal with evidence of old fracture deformity 3. Large calcaneal spurs Electronically Signed   By: Donavan Foil M.D.   On: 11/04/2017 18:15   Dg Foot Complete Right  Result Date: 11/04/2017 CLINICAL DATA:  Foot wounds EXAM: RIGHT FOOT COMPLETE - 3+ VIEW COMPARISON:  None. FINDINGS: No acute displaced fracture or malalignment is seen. Extensive vascular calcifications. Possible old fracture deformity involving the proximal shaft and base of the third metatarsal. Large plantar calcaneal spur. No soft tissue gas. IMPRESSION: 1. No acute osseous abnormality 2. Large plantar calcaneal spur Electronically Signed   By: Donavan Foil M.D.   On: 11/04/2017 18:23     Assessment/Plan Principal Problem:   Weakness Active Problems:   Essential hypertension   Diabetes mellitus due to underlying condition with diabetic nephropathy (HCC)   Pacemaker   ESRD (end stage renal disease) on dialysis [facility])   Atrial fibrillation, permanent (HCC)   Myasthenia gravis (HCC)   Benign essential HTN   Cellulitis   Cellulitis of right lower extremity   Generalized weakness    1. Generalized weakness with history of myasthenia gravis -appreciate neurology consult discussed with neurologist.  At this time neurologist does not feel patient has myasthenia crisis.  Closely follow NIF and vital capacity.  Patient is on prednisone which will be continued. 2. Lower extremity peripheral vascular disease with chronic ulcerations which look infected -patient has been given 1 dose of Zyvox since patient cannot get vancomycin secondary to myasthenia.  Patient also will be on cefepime.  For further dose of Zyvox please consult infectious disease in a.m.  Please consult Dr. Donnetta Hutching vascular surgeon as  patient may eventually need amputation. 3. Diabetes mellitus type 2 -will place patient on sliding scale coverage and hold oral hypoglycemics for now.  Patient is on prednisone which will be continued.  4. Atrial fibrillation on Coumadin.  In anticipation of procedure will keep patient on heparin.  Rate controlled.  Hypertension uncontrolled we will keep patient on PRN IV hydralazine in addition to home medications. 5. Chronic anemia likely  secondary to ESRD follow CBC. 6. History of CVA. 7. History of pacemaker placement. 8. ESRD on hemodialysis on Monday Wednesday and Friday please consult nephrology for dialysis.   DVT prophylaxis: Heparin/Coumadin. Code Status: Full code. Family Communication: Discussed with patient. Disposition Plan: Home. Consults called: Neurology. Admission status: Inpatient.   Rise Patience MD [facility] Pager [phone].  If 7PM-7AM, please contact night-coverage [email or web address] Password [facility]  11/04/2017, 10:06 PM

## 2017-11-04 NOTE — Progress Notes (Signed)
ANTICOAGULATION CONSULT NOTE - Initial Consult  Pharmacy Consult for Heparin (when INR<2) Indication: atrial fibrillation  Allergies  Allergen Reactions  . Amlodipine Besylate Swelling    Angioedema   . Lisinopril Other (See Comments)    Renal insufficiency  . Statins Other (See Comments)    Muscle aches  . Actos [Pioglitazone]     Chest pain  . Ropinirole Other (See Comments)    Causes excessive grogginess the following morning after taking  . Tape Other (See Comments)    SKIN IS THIN AND TAPE WILL ACTUALLY TEAR IT; PLEASE USE COBAN WRAP INSTEAD!!    Patient Measurements: Height: 6' (182.9 cm) Weight: 175 lb (79.4 kg) IBW/kg (Calculated) : 77.6 Heparin Dosing Weight:   Vital Signs: BP: 158/66 (02/19 2155) Pulse Rate: 89 (02/19 2155)  Labs: Recent Labs    11/04/17 1500  HGB 10.4*  HCT 33.6*  PLT 402*  LABPROT 30.9*  INR 3.00  CREATININE 5.57*    Estimated Creatinine Clearance: 11.8 mL/min (A) (by C-G formula based on SCr of 5.57 mg/dL (H)).   Medical History: Past Medical History:  Diagnosis Date  . Anemia 07/28/2017  . Atrial fibrillation ([place])    on Coumadin  . CHF (congestive heart failure) ([place])   . DDD (degenerative disc disease)   . Diabetes mellitus without complication ([place])   . ESRD (end stage renal disease) on dialysis ([place])   . Hyperlipidemia   . Hypertension   . Melanoma ([place]) 2010   removed at [facility]  . Second degree heart block    a. s/p STJ dual chamber PPM - Dr [name]  . Stroke ([place]) 01/2017    Assessment: 81 YOM on warfarin PTA for hx Afib. Admit INR 3 on PTA dose of 1mg  daily. Now holding warfarin and pharmacy consulted to start Heparin when INR<2.   Goal of Therapy:  INR 2-3 Heparin level 0.3-0.7 units/ml Monitor platelets by anticoagulation protocol: Yes   Plan:  1. No heparin for now 2. Will plan to start when INR<2 3. F/u INR in the AM  Thank you for allowing pharmacy to be a part of this patient's care.  Alycia Rossetti, PharmD, BCPS Clinical Pharmacist Pager: [phone] 11/04/2017 10:11 PM

## 2017-11-04 NOTE — ED Notes (Signed)
Pt encouraged to provide urine sample. Does not need to go at this time.

## 2017-11-04 NOTE — ED Triage Notes (Addendum)
Per EMS: Pt has been c/o weakness x 3 days. Pt is MWF dialysis and went yesterday.  Pt states he was not able to get out his chair today and that is why he called EMS.  HR is paced in the 90's, 131/61, A&Ox4, CBG is 386 and Pt took indulin before EMS arrived.  Pt from home

## 2017-11-04 NOTE — ED Notes (Signed)
IV attempted withput succwess.

## 2017-11-04 NOTE — Progress Notes (Signed)
Pharmacy Antibiotic Note  James David is a 82 y.o. male with history of myasthenia gravis and ESRD who presented on 11/04/2017 with weakness x 3 days concerning for a myasthenia gravis exacerbation. Further work-up revealed lower extremity wounds worsened in appearance with purulence and cellulitis with imaging concerning for possible osteomyelitis. Pharmacy has been consulted for Vancomycin and Cefepime dosing for empiric cellulitis/osteo coverage.   The patient states that his last HD session was on Mon, 2/18 and the patient did not receive any antibiotics at HD. Usual schedule is MWF  Plan: 1. Vancomycin 1750 mg IV x 1 to load followed by 750 mg/HD-MWF 2. Cefepime 1g IV x 1 followed by 2g on MWF @ 2000 3. Will continue to follow HD schedule/duration, culture results, LOT, and antibiotic de-escalation plans   Height: 6' (182.9 cm) Weight: 175 lb (79.4 kg) IBW/kg (Calculated) : 77.6  No data recorded.  Recent Labs  Lab 11/04/17 1500 11/04/17 1717  WBC 13.3*  --   CREATININE 5.57*  --   LATICACIDVEN  --  1.36    Estimated Creatinine Clearance: 11.8 mL/min (A) (by C-G formula based on SCr of 5.57 mg/dL (H)).    Allergies  Allergen Reactions  . Amlodipine Besylate Swelling    Angioedema   . Lisinopril Other (See Comments)    Renal insufficiency  . Statins Other (See Comments)    Muscle aches  . Actos [Pioglitazone]     Chest pain  . Ropinirole Other (See Comments)    Causes excessive grogginess the following morning after taking  . Tape Other (See Comments)    SKIN IS THIN AND TAPE WILL ACTUALLY TEAR IT; PLEASE USE COBAN WRAP INSTEAD!!    Antimicrobials this admission: Vanc 2/19 >> Cefepime 2/19 >>  Dose adjustments this admission:  Microbiology results: 2/19 UCx >>  Thank you for allowing pharmacy to be a part of this patient's care.  Alycia Rossetti, PharmD, BCPS Clinical Pharmacist Pager: [phone] Clinical phone for 11/04/2017:: [phone] 11/04/2017 8:05 PM

## 2017-11-05 ENCOUNTER — Other Ambulatory Visit: Payer: Self-pay

## 2017-11-05 DIAGNOSIS — I739 Peripheral vascular disease, unspecified: Secondary | ICD-10-CM

## 2017-11-05 DIAGNOSIS — N186 End stage renal disease: Secondary | ICD-10-CM

## 2017-11-05 DIAGNOSIS — I10 Essential (primary) hypertension: Secondary | ICD-10-CM

## 2017-11-05 DIAGNOSIS — I482 Chronic atrial fibrillation: Secondary | ICD-10-CM

## 2017-11-05 DIAGNOSIS — Z992 Dependence on renal dialysis: Secondary | ICD-10-CM

## 2017-11-05 LAB — URINALYSIS, ROUTINE W REFLEX MICROSCOPIC
BILIRUBIN URINE: NEGATIVE
Glucose, UA: >=500 mg/dL — AB
Ketones, ur: NEGATIVE mg/dL
Nitrite: NEGATIVE
PH: 7 (ref 5.0–8.0)
Protein, ur: 100 mg/dL — AB
SPECIFIC GRAVITY, URINE: 1.013 (ref 1.005–1.030)
SQUAMOUS EPITHELIAL / LPF: NONE SEEN

## 2017-11-05 LAB — GLUCOSE, CAPILLARY
GLUCOSE-CAPILLARY: 213 mg/dL — AB (ref 65–99)
GLUCOSE-CAPILLARY: 254 mg/dL — AB (ref 65–99)
Glucose-Capillary: 178 mg/dL — ABNORMAL HIGH (ref 65–99)
Glucose-Capillary: 194 mg/dL — ABNORMAL HIGH (ref 65–99)
Glucose-Capillary: 285 mg/dL — ABNORMAL HIGH (ref 65–99)

## 2017-11-05 LAB — BASIC METABOLIC PANEL
ANION GAP: 18 — AB (ref 5–15)
BUN: 62 mg/dL — ABNORMAL HIGH (ref 6–20)
CALCIUM: 9.1 mg/dL (ref 8.9–10.3)
CO2: 22 mmol/L (ref 22–32)
Chloride: 93 mmol/L — ABNORMAL LOW (ref 101–111)
Creatinine, Ser: 6.31 mg/dL — ABNORMAL HIGH (ref 0.61–1.24)
GFR, EST AFRICAN AMERICAN: 9 mL/min — AB (ref >60)
GFR, EST NON AFRICAN AMERICAN: 7 mL/min — AB (ref >60)
Glucose, Bld: 191 mg/dL — ABNORMAL HIGH (ref 65–99)
Potassium: 3.6 mmol/L (ref 3.5–5.1)
Sodium: 133 mmol/L — ABNORMAL LOW (ref 135–145)

## 2017-11-05 LAB — CBC
HCT: 30.1 % — ABNORMAL LOW (ref 39.0–52.0)
Hemoglobin: 9.2 g/dL — ABNORMAL LOW (ref 13.0–17.0)
MCH: 32.1 pg (ref 26.0–34.0)
MCHC: 30.6 g/dL (ref 30.0–36.0)
MCV: 104.9 fL — ABNORMAL HIGH (ref 78.0–100.0)
Platelets: 357 10*3/uL (ref 150–400)
RBC: 2.87 MIL/uL — AB (ref 4.22–5.81)
RDW: 18 % — ABNORMAL HIGH (ref 11.5–15.5)
WBC: 11.8 10*3/uL — AB (ref 4.0–10.5)

## 2017-11-05 LAB — PROTIME-INR
INR: 3.42
PROTHROMBIN TIME: 34.3 s — AB (ref 11.4–15.2)

## 2017-11-05 MED ORDER — GABAPENTIN 100 MG PO CAPS
100.0000 mg | ORAL_CAPSULE | Freq: Every day | ORAL | Status: DC
  Administered 2017-11-05 – 2017-11-08 (×4): 100 mg via ORAL
  Filled 2017-11-05 (×4): qty 1

## 2017-11-05 MED ORDER — ASPIRIN 81 MG PO CHEW
81.0000 mg | CHEWABLE_TABLET | Freq: Every day | ORAL | Status: DC
  Administered 2017-11-06 – 2017-11-12 (×7): 81 mg via ORAL
  Filled 2017-11-05 (×7): qty 1

## 2017-11-05 MED ORDER — GABAPENTIN 100 MG PO CAPS
200.0000 mg | ORAL_CAPSULE | Freq: Every day | ORAL | Status: DC
  Administered 2017-11-05 – 2017-11-07 (×4): 200 mg via ORAL
  Filled 2017-11-05 (×4): qty 2

## 2017-11-05 MED ORDER — VANCOMYCIN HCL IN DEXTROSE 750-5 MG/150ML-% IV SOLN
750.0000 mg | INTRAVENOUS | Status: DC
  Administered 2017-11-07: 750 mg via INTRAVENOUS
  Filled 2017-11-05 (×2): qty 150

## 2017-11-05 MED ORDER — ASPIRIN 81 MG PO CHEW: 81.0000 mg | CHEWABLE_TABLET | Freq: Once | ORAL | Status: DC

## 2017-11-05 MED ORDER — SODIUM CHLORIDE 0.9 % IV SOLN
1.0000 g | INTRAVENOUS | Status: DC
  Administered 2017-11-05 – 2017-11-09 (×5): 1 g via INTRAVENOUS
  Filled 2017-11-05 (×6): qty 1

## 2017-11-05 MED ORDER — INSULIN ASPART 100 UNIT/ML ~~LOC~~ SOLN
4.0000 [IU] | Freq: Once | SUBCUTANEOUS | Status: AC
  Administered 2017-11-05: 4 [IU] via SUBCUTANEOUS

## 2017-11-05 MED ORDER — COLLAGENASE 250 UNIT/GM EX OINT
TOPICAL_OINTMENT | Freq: Every day | CUTANEOUS | Status: DC
  Administered 2017-11-05 – 2017-11-11 (×7): via TOPICAL
  Filled 2017-11-05 (×2): qty 30

## 2017-11-05 MED ORDER — HYDRALAZINE HCL 20 MG/ML IJ SOLN: 10.0000 mg | INTRAMUSCULAR | Status: DC | PRN

## 2017-11-05 MED ORDER — INSULIN GLARGINE 100 UNIT/ML ~~LOC~~ SOLN
5.0000 [IU] | Freq: Every day | SUBCUTANEOUS | Status: DC
  Administered 2017-11-05 – 2017-11-11 (×7): 5 [IU] via SUBCUTANEOUS
  Filled 2017-11-05 (×8): qty 0.05

## 2017-11-05 NOTE — Progress Notes (Signed)
FVC 2L, NIF > -40.  Pt had good effort, reproducible results.

## 2017-11-05 NOTE — Progress Notes (Signed)
Patient received from ED  Via bed. Patient is alert and oriented x3. Vital signs are stable. Skin assessment done with another nurse. Patient is on monitor. Patient given instruction about call bell, phone and unit routine. Bed in low position and side rail up x2. Call bell in reach.

## 2017-11-05 NOTE — Progress Notes (Addendum)
No HD orders entered yet (confirmed with HD unit). Doubtful patient will go to HD tonight since there is not a note from renal provider yet.  Plan: Move vancomycin to start on Friday 2/22 with HD Change cefepime to 1g IV q24h starting tonight at 2000 Follow up HD schedule and re-time antibiotics as needed  Desha Bitner D. Toluwani Ruder, PharmD, BCPS Clinical Pharmacist [phone] 11/05/2017 7:05 PM

## 2017-11-05 NOTE — Progress Notes (Addendum)
PROGRESS NOTE  [name]:[id number] DOB: 06/03/1936 DOA: 11/04/2017 PCP: [name], MD   LOS: 1 day   Brief Narrative / Interim history: James David is a 82 y.o male with a medical history significant for myasthenia gravis, DM, ESRD on HD M-W-F, Atrial fibrillation on Coumadin, anemia, chronic lower extremity wounds due to PVD, and history of prior stroke in May who presented to the ED on 11/04/2017 due to worsening generalized weakness over the past week. Patient was recently diagnosed with myasthenia gravis in June 2018 and is currently following up with an outpatient neurologist. He has been wheelchair bound since his diagnosis of MG. His EMG in January demonstrated dependent sensory polyneuropathy with no indications of ALS. Upon arrival to the ED, patient admitted to generalized weakness, but denied difficulties breathing, chest pain, dysphagia, or diplopia. He was found to have multiple ulcerations on both his right and left feet, with the right lateral malleolus ulcer displaying signs of infection such as purulent discharge and erythema surrounding the lesion. Right ankle x-ray demonstrated questionable small cortical erosion/focus of bone infection at the lateral fibular malleolar region and thinning of the soft tissues and lucency overlying the fibular malleolus. Initial CMP illustrated Na 132, chloride 89, glucose 253, BUN 54, Creatinine 5.57, anion gap 17, GFR 9, WBC 13.3, Hgb 10.4, HCT 33.6, MCV 104, PT 30.9, INR 3. Patient's last hemodialysis was 10/03/17.   Patient was admitted on the working diagnosis of cellulitis of the right foot with possible osteomyelitis and increasing generalized weakness to rule in/out myasthenia gravis exacerbation or crisis. Neurology was consulted.    Assessment & Plan: Principal Problem:   Weakness Active Problems:   Essential hypertension   Diabetes mellitus due to underlying condition with diabetic nephropathy (HCC)   Pacemaker   ESRD  (end stage renal disease) on dialysis [facility])   Atrial fibrillation, permanent (HCC)   Myasthenia gravis (HCC)   Benign essential HTN   Cellulitis   Cellulitis of right lower extremity   Generalized weakness   1. Cellulitis of right lateral malleolus with possible signs of osteomyelitis: Patient's right lateral malleolus ulcer appeared to be a stage 3 with erythema spreading down the lateral aspect of foot. Patient's wife said his lower extremity wounds have been presents for a couple of months. Patient also has several other ulcers on both his right and left feet with eschar formation.  -Patient was given 1 dose of Vancomycin, Zyvox, cefepime, and Flagyl yesterday for empirical treatment. Continue cefepime 2g in sodium chloride 0.9% 135mL at 200 mL/hr q M,W,F. Add vancomycin renally dosed. -Consult wound care for evaluation of right lateral malleolus and wound treatment -Consult orthopedics for evaluation of possible osteomyelitis -Possible consult to Dr. Donnetta Hutching, patient's vascular surgeon if amputation is needed  2. Myasthenia Gravis: Neurology was consulted and does not believe the patient is currently in an MG crisis. He is still complaining of weakness that he believes has gotten worse since he was admitted into the hospital. Patient was receiving IVIG treatments, but became too expensive. His last treatment with IVIG was the end of January. His wife is currently trying to get insurance coverage to start them back up -Continue to monitor respiratory function due to risk of respiratory distress from MG. -Continue Prednisone  3. End stage renal disease: Patient's last HD was Monday 11/03/17. His typical HD schedule is MWF. -Send patient to HD today to continue his normal HD schedule -Continue to monitor electrolytes with CMP tomorrow morning after HD.  4. Hypertension: Patient's BP has decreased today at 127/71. BP has been fluctuating 140-150s SBP over the past 24 hours. -Continue Carvedilol  25mg  BID. Hydralazine 10mg  prn q 10 hours if systolic BP greater than 025.  5. Atrial Fibrillation:  -Patient's Coumadin was help last night because his INR was 3.42 -Continue to monitor INR and give Coumadin when INR gets below 3. Goal INR of 2-3.  6. Diabetes Mellitus Type 2: Patient glucose was 353 upon presentation to the ED and has slightly decreased to 191. -Continue patient on sliding scale of insulin with glucose monitoring. Prednisone will likely increase patient's glucose over the course of treatment -Add Lantus  7. Chronic Anemia due to ESRD:  -Continue to monitor CBC and symptoms of anemia  DVT prophylaxis: Coumadin (but was held last night due to INR of 3.42) Code Status: Full Family Communication: Patient's wife was present at bedside Disposition Plan: Home when ready  Consultants:  Wound Care Orthopedics Neurology   Procedures:    Antimicrobials: Cefepime 2g in sodium chloride 0.9% 117mL at 134mL/hr: every M-W-F (1) Past: Cefepime 1g in sodium chloride, Zyvox IVPB 600mg , Flagyl 500mg , Vancomycin 1,750mg  (all 1 dose yesterday)  Subjective: Patient was awake and alert. He explained that he still feels weak and worn out which has increased since admission. He stated that he is still having mild pain in his right ankle. He denies shortness of breath, chest pain, and dysphagia. Patient's wife was at bedside and explained that patient has had a decrease in appetite over the past few days.   Objective: Vitals:   11/05/17 0916 11/05/17 0917 11/05/17 1050 11/05/17 1226  BP:   127/71   Pulse: 91 88 (!) 108   Resp: (!) 23 13 (!) 25   Temp:   98 F (36.7 C) 98.4 F (36.9 C)  TempSrc:   Oral Oral  SpO2: 99% 98% 94%   Weight:      Height:        Intake/Output Summary (Last 24 hours) at 11/05/2017 1241 Last data filed at 11/05/2017 0535 Gross per 24 hour  Intake 220 ml  Output -  Net 220 ml   Filed Weights   11/04/17 1333  Weight: 79.4 kg (175 lb)     Examination:  Constitutional: NAD Eyes: PERRL, EOMs intact (he had difficulty keeping eyes open entire time), ptosis bilaterally Respiratory: clear to auscultation bilaterally, no wheezing, no crackles. Normal respiratory effort. No accessory muscle use.  Cardiovascular: Regular rate and rhythm, normal S1 and S1, no murmurs / rubs / gallops. No LE edema.  Abdomen: no tenderness. Bowel sounds positive. Mildly distended Musculoskeletal: no clubbing / cyanosis. Strength 4/5 left foot; strength 3/5 right foot.  Skin: Multiple ulcerations on both right and left feet. Main ulceration on right malleolus was stage 3 with purulent discharge and erythema extending down the lateral aspect of foot. Other ulcerations contained eschar development  Neurologic:  Motor:  upper extremities: equal grip strength; 5/5 strength bilaterally. Asterixis noted bilaterally Lower extremities: 4/5 strength left foot; 3/5 strength right foot. Decreased in ROM of right toes. Sensory: sensation intact bilaterally to touch Psychiatric: Alert and oriented x 3   Data Reviewed: I have independently reviewed following labs and imaging studies   CBC: Recent Labs  Lab 11/04/17 1500 11/05/17 0637  WBC 13.3* 11.8*  NEUTROABS 11.6*  --   HGB 10.4* 9.2*  HCT 33.6* 30.1*  MCV 104.0* 104.9*  PLT 402* 852   Basic Metabolic Panel: Recent Labs  Lab  11/04/17 1500 11/05/17 0637  NA 132* 133*  K 3.8 3.6  CL 89* 93*  CO2 26 22  GLUCOSE 353* 191*  BUN 54* 62*  CREATININE 5.57* 6.31*  CALCIUM 9.4 9.1  MG 2.3  --    GFR: Estimated Creatinine Clearance: 10.4 mL/min (A) (by C-G formula based on SCr of 6.31 mg/dL (H)). Liver Function Tests: Recent Labs  Lab 11/04/17 1500  AST 22  ALT 23  ALKPHOS 49  BILITOT 0.7  PROT 6.7  ALBUMIN 2.4*   No results for input(s): LIPASE, AMYLASE in the last 168 hours. No results for input(s): AMMONIA in the last 168 hours. Coagulation Profile: Recent Labs  Lab  11/04/17 1500 11/05/17 0637  INR 3.00 3.42   Cardiac Enzymes: No results for input(s): CKTOTAL, CKMB, CKMBINDEX, TROPONINI in the last 168 hours. BNP (last 3 results) No results for input(s): PROBNP in the last 8760 hours. HbA1C: No results for input(s): HGBA1C in the last 72 hours. CBG: Recent Labs  Lab 11/05/17 0422 11/05/17 0816 11/05/17 1225  GLUCAP 178* 194* 213*   Lipid Profile: No results for input(s): CHOL, HDL, LDLCALC, TRIG, CHOLHDL, LDLDIRECT in the last 72 hours. Thyroid Function Tests: Recent Labs    11/04/17 1703  TSH 0.484   Anemia Panel: No results for input(s): VITAMINB12, FOLATE, FERRITIN, TIBC, IRON, RETICCTPCT in the last 72 hours. Urine analysis:    Component Value Date/Time   COLORURINE YELLOW 07/27/2017 1609   COLORURINE AMBER (A) 07/27/2017 1609   APPEARANCEUR TURBID (A) 07/27/2017 1609   APPEARANCEUR TURBID (A) 07/27/2017 1609   LABSPEC 1.010 07/27/2017 1609   LABSPEC 1.010 07/27/2017 1609   PHURINE 8.0 07/27/2017 1609   PHURINE 8.0 07/27/2017 1609   GLUCOSEU >=500 (A) 07/27/2017 1609   GLUCOSEU >=500 (A) 07/27/2017 1609   HGBUR SMALL (A) 07/27/2017 1609   HGBUR SMALL (A) 07/27/2017 1609   BILIRUBINUR NEGATIVE 07/27/2017 1609   BILIRUBINUR NEGATIVE 07/27/2017 1609   KETONESUR NEGATIVE 07/27/2017 1609   KETONESUR NEGATIVE 07/27/2017 1609   PROTEINUR 100 (A) 07/27/2017 1609   PROTEINUR 100 (A) 07/27/2017 1609   NITRITE NEGATIVE 07/27/2017 1609   NITRITE NEGATIVE 07/27/2017 1609   LEUKOCYTESUR LARGE (A) 07/27/2017 1609   LEUKOCYTESUR LARGE (A) 07/27/2017 1609   Sepsis Labs: Invalid input(s): PROCALCITONIN, LACTICIDVEN  No results found for this or any previous visit (from the past 240 hour(s)).    Radiology Studies: Dg Chest 2 View  Result Date: 11/04/2017 CLINICAL DATA:  Fatigue, weakness, history of myasthenia gravis EXAM: CHEST  2 VIEW COMPARISON:  07/27/2017 FINDINGS: Surgical clips over the left chest. Left-sided pacing  device, similar compared to previous exam. No significant pleural effusion. No acute consolidation. Stable enlarged cardiomediastinal silhouette with aortic atherosclerosis. No pneumothorax. Vascular stent in the right axillary region. Flowing osteophytes of the spine. IMPRESSION: No active cardiopulmonary disease.  Cardiomegaly. Electronically Signed   By: Donavan Foil M.D.   On: 11/04/2017 18:13   Dg Ankle 2 Views Left  Result Date: 11/04/2017 CLINICAL DATA:  Ankle pain EXAM: LEFT ANKLE - 2 VIEW COMPARISON:  None. FINDINGS: Only a single lateral view of the ankle is submitted. According to the notes, patient unable to hold position for AP view. Large plantar calcaneal spur and enthesophytes. Vascular calcification. No obvious acute abnormality IMPRESSION: 1. Limited single lateral view shows no acute abnormality 2. Large plantar calcaneal spur. Electronically Signed   By: Donavan Foil M.D.   On: 11/04/2017 18:42   Dg Ankle Complete Right  Result Date: 11/04/2017 CLINICAL DATA:  Worsening foot wounds EXAM: RIGHT ANKLE - COMPLETE 3+ VIEW COMPARISON:  06/28/2016 FINDINGS: No acute displaced fracture or malalignment is seen. Possible small erosion at the fibular malleolar tip on one view only. There is lucency and thinning of the soft tissues over the fibular malleolus suspected to represent an ulcer. Mild degenerative changes of the medial ankle joint. Vascular calcifications. Large calcaneal spur and enthesophyte. Soft tissue thickening posterior calcaneal region. No soft tissue gas IMPRESSION: 1. Questionable small cortical erosion/focus of bone infection at the lateral fibular malleolar region on one view; there is thinning of the soft tissues and a lucency overlying the fibular malleolus, presumably representing an ulcer or wound. 2. Large plantar calcaneal spurs. Electronically Signed   By: Donavan Foil M.D.   On: 11/04/2017 18:19   Dg Foot Complete Left  Result Date: 11/04/2017 CLINICAL DATA:   Bilateral foot wounds right and left heel EXAM: LEFT FOOT - COMPLETE 3+ VIEW COMPARISON:  None. FINDINGS: No acute fracture or malalignment. Moderate arthritis at the first MTP joint. Screw fixation of the head of fifth metatarsal with chronic deformity noted. Vascular calcifications. Large calcaneal spurs and enthesophyte. No soft tissue gas. No radiopaque foreign body IMPRESSION: 1. No acute osseous abnormality 2. Postsurgical changes of the distal fifth metatarsal with evidence of old fracture deformity 3. Large calcaneal spurs Electronically Signed   By: Donavan Foil M.D.   On: 11/04/2017 18:15   Dg Foot Complete Right  Result Date: 11/04/2017 CLINICAL DATA:  Foot wounds EXAM: RIGHT FOOT COMPLETE - 3+ VIEW COMPARISON:  None. FINDINGS: No acute displaced fracture or malalignment is seen. Extensive vascular calcifications. Possible old fracture deformity involving the proximal shaft and base of the third metatarsal. Large plantar calcaneal spur. No soft tissue gas. IMPRESSION: 1. No acute osseous abnormality 2. Large plantar calcaneal spur Electronically Signed   By: Donavan Foil M.D.   On: 11/04/2017 18:23     Scheduled Meds: . aspirin EC  81 mg Oral QHS  . calcium acetate  1,334 mg Oral TID WC  . carvedilol  25 mg Oral BID  . ezetimibe  10 mg Oral Daily  . feeding supplement (NEPRO CARB STEADY)  237 mL Oral Q M,W,F  . fenofibrate  160 mg Oral Daily  . finasteride  5 mg Oral Daily  . folic acid-pyridoxine-cyancobalamin  1 tablet Oral QHS  . gabapentin  100 mg Oral Daily   And  . gabapentin  200 mg Oral QHS  . insulin aspart  0-9 Units Subcutaneous TID WC  . lanthanum  1,000 mg Oral TID WC  . Melatonin  3 mg Oral QHS  . predniSONE  20 mg Oral Q breakfast   Continuous Infusions: . ceFEPime (MAXIPIME) IV         Time spent: 25 minutes    Lanna Poche, PA-S 11/05/2017, 12:41 PM

## 2017-11-05 NOTE — Progress Notes (Signed)
Pharmacy Antibiotic Note  James David is a 82 y.o. male with history of myasthenia gravis and ESRD who presented on 11/04/2017 with weakness x 3 days concerning for a myasthenia gravis exacerbation. Further work-up revealed lower extremity wounds worsened in appearance with purulence and cellulitis with imaging concerning for possible osteomyelitis. Pharmacy has been consulted for restart of Vancomycin for empiric cellulitis/osteo coverage.   Last HD session was on Mon, 2/18.  Pt received Vancomycin loading dose of 1750 mg on 2/19.  Will restart Vancomycin post-HD today.  No doses missed.  Usual schedule is MWF  Plan: 1. Vancomycin 750 mg/HD-MWF (start 11/05/17) 2. Continue Cefepime 1g IV x 1 followed by 2g on MWF @ 2000 3. Will continue to follow HD schedule/duration, culture results, LOT, and antibiotic de-escalation plans   Height: 6' (182.9 cm) Weight: 175 lb (79.4 kg) IBW/kg (Calculated) : 77.6  Temp (24hrs), Avg:98.3 F (36.8 C), Min:98 F (36.7 C), Max:98.7 F (37.1 C)  Recent Labs  Lab 11/04/17 1500 11/04/17 1717 11/05/17 0637  WBC 13.3*  --  11.8*  CREATININE 5.57*  --  6.31*  LATICACIDVEN  --  1.36  --     Estimated Creatinine Clearance: 10.4 mL/min (A) (by C-G formula based on SCr of 6.31 mg/dL (H)).    Allergies  Allergen Reactions  . Amlodipine Besylate Swelling    Angioedema   . Lisinopril Other (See Comments)    Renal insufficiency  . Statins Other (See Comments)    Muscle aches  . Actos [Pioglitazone]     Chest pain  . Ropinirole Other (See Comments)    Causes excessive grogginess the following morning after taking  . Tape Other (See Comments)    SKIN IS THIN AND TAPE WILL ACTUALLY TEAR IT; PLEASE USE COBAN WRAP INSTEAD!!    Antimicrobials this admission: Vanc 2/19 >> Cefepime 2/19 >>  Dose adjustments this admission:  Microbiology results: None ordered (as of 2/20)  Thank you for allowing pharmacy to be a part of this patient's  care.  [employer], Pharm.D., BCPS Clinical Pharmacist Pager: [phone] Clinical phone for 11/05/2017 from 8:30-4:00 is [phone]. After 4pm, please call Main Rx [phone]) for assistance. 11/05/2017 4:23 PM

## 2017-11-05 NOTE — Plan of Care (Signed)
Progressing

## 2017-11-05 NOTE — Progress Notes (Signed)
Pt did NIF -40 and FVC 1.5L with great effort.

## 2017-11-05 NOTE — Care Management Note (Addendum)
Case Management Note  Patient Details  Name: James David MRN: [id number] Date of Birth: Nov 19, 1935  Subjective/Objective:   Weakness, MG                Action/Plan: Home - lives with wife; NOK: wife, [phone]  PCP: [name], MD; has private insurance with [facility] with prescription drug coverage; CM following for progression of care; awaiting on Physical Therapy eval for disposition needs; short term SNF vs HHC.   Expected Discharge Date:    possibly 11/10/2017              Expected Discharge Plan:  [place]  Discharge planning Services  CM Consult  Status of Service:  In process, will continue to follow  Sherrilyn Rist [phone] 11/05/2017, 10:09 AM

## 2017-11-05 NOTE — Consult Note (Addendum)
Neurology Consultation Reason for Consult: Weakness Referring Physician: Hal Hope, a  CC: Weakness  History is obtained from: Patient  HPI: James David is a 82 y.o. male reports generalized weakness that is been getting progressively worse.  He presented to the emergency room it was noted that he had an infected lesion on his right ankle purulent drainage.   Of note, he has had progressive lower extremity weakness over the past few months.  I saw him in November and noted hyperreflexia and recommended an EMG at that time, this was done in January which did demonstrate a length dependent sensory polyneuropathy but no signs of ALS.  He has had difficulty with bladder control.  He has some cervical stenosis on CT, but cannot get an MRI due to pacemaker.  He has progressed to the point now that he is essentially nonambulatory.  ROS: A 14 point ROS was performed and is negative except as noted in the HPI.   Past Medical History:  Diagnosis Date  . Anemia 07/28/2017  . Atrial fibrillation ([place])    on Coumadin  . CHF (congestive heart failure) ([place])   . DDD (degenerative disc disease)   . Diabetes mellitus without complication ([place])   . ESRD (end stage renal disease) on dialysis ([place])   . Hyperlipidemia   . Hypertension   . Melanoma ([place]) 2010   removed at [facility]  . Second degree heart block    a. s/p STJ dual chamber PPM - Dr [name]  . Stroke [facility]) 01/2017     Family History  Problem Relation Age of Onset  . Pulmonary embolism Mother   . Diabetes Mother   . Heart attack Father   . Diabetes Brother      Social History:  reports that  has never smoked. he has never used smokeless tobacco. He reports that he does not drink alcohol or use drugs.   Exam: Current vital signs: BP (!) 145/64   Pulse 90   Resp 18   Ht 6' (1.829 m)   Wt 79.4 kg (175 lb)   SpO2 94%   BMI 23.73 kg/m  Vital signs in last 24 hours: Pulse Rate:  [86-95] 90 (02/20 0000) Resp:  [14-28] 18 (02/20  0000) BP: (129-159)/(59-88) 145/64 (02/20 0000) SpO2:  [94 %-100 %] 94 % (02/20 0000) Weight:  [79.4 kg (175 lb)] 79.4 kg (175 lb) (02/19 1333)   Physical Exam  Constitutional: Appears well-developed and well-nourished.  Psych: Affect appropriate to situation Eyes: No scleral injection HENT: No OP obstrucion Head: Normocephalic.  Cardiovascular: Normal rate and regular rhythm.  Respiratory: Effort normal, non-labored breathing GI: Soft.  No distension. There is no tenderness.  Skin: WDI  Neuro: Mental Status: Patient is awake, alert, oriented to person, place, month, year, and situation. Patient is able to give a clear and coherent history. No signs of aphasia or neglect Cranial Nerves: II: Visual Fields are full. Pupils are equal, round, and reactive to light.   III,IV, VI: EOMI without ptosis or diploplia.  V: Facial sensation is symmetric to temperature VII: Facial movement is symmetric.  VIII: hearing is intact to voice X: Uvula elevates symmetrically XI: Shoulder shrug is symmetric. XII: tongue is midline without atrophy or fasciculations.  Motor: He has 5/5 strength bilaterally, but has asterixis.  In his legs, he has 4/5 weakness with some possible increase in tone in the right leg. Sensory: Sensation is symmetric to light touch and temperature in the arms and legs. Deep Tendon Reflexes:  2+ and symmetric in the biceps and patellae. Plantars: Toes are equivocal bilaterally Cerebellar: He has tremor most consistent with asterixis bilaterally  I have reviewed labs in epic and the results pertinent to this consultation are: Creatinine 5.57 INR 3.0  I have reviewed the images obtained: CT head-negative  Impression: 82 year old male with progressive weakness in the setting of antibody positive myasthenia gravis as well as a focal infection from a nonhealing wound.  Though prednisone may be contributing to his difficulty with wound healing, I would not favor stopping  this abruptly as this would very likely precipitate crisis.  He has not tolerated Mestinon in the past due to gastrointestinal side effects.  He has no bulbar findings, cranial nerve findings to suggest that this is a myasthenic crisis.  I think is more likely combination of metabolic derangement (as evidenced by asterixis) likely due to narcotics and renal failure, though gabapentin could be playing a role.  Also current infection could be playing a role as well.  I am not certain how much of his lower extremity weakness and wasting is due to peripheral vascular disease as opposed to myelopathy, but I continue to be concerned about hospital myelopathy.  Of note, however, is not nearly as hyperreflexic as he was when I saw him in November.  Recommendations: 1) continue prednisone 20 mg daily 2) would limit gabapentin and narcotics 3) I think that he needs consideration of a CT myelogram, whether this is done as an inpatient or an outpatient.  May need discussion with nephrology as far as safety.    Also may need consideration of discussion with neurosurgery as if he is a not a surgical candidate, then there would be no need to do the procedure.  4) treatment of underlying medical issues. 5) Serial NIfs, if stable times 24 hours then could go back to once per day. 6) There have been reports of vancomycin worsening myasthenia. See below for additional considerations.     Roland Rack, MD [facility] [phone]  If 7pm- 7am, please page neurology on call as listed in [place].    Drugs to avoid if you have Myasthenia Gravis:   Many drugs have been reported to have adverse effects in patients with MG (see below). However, not all patients react adversely to all these drugs. Conversely, not all "safe" drugs can be used with impunity in patients with MG.As a rule, the listed drugs should be avoided whenever possible, and patients with MG should be followed closely when any  new drug is introduced.  Drugs that may exacerbate MG  Antibiotics  Aminoglycosides: e.g., streptomycin, tobramycin, kanamycin  Quinolones: e.g., ciprofloxacin, levofloxacin, ofloxacin, gatifloxacin  Macrolides: e.g., erythromycin, azithromycin, telithromycin  Nondepolarizing muscle relaxants for surgery  d-Tubocurarine (curare), pancuronium, vecuronium, atracurium  Beta-blocking agents  Propranolol, atenolol, metoprolol  Local anesthetics and related agents  Procaine, Xylocaine in large amounts  Procainamide (for arrhythmias)  Botulinum toxin  Botox exacerbates weakness.  Quinine derivatives  Quinine, quinidine, chloroquine, mefloquine ([place]am)  Magnesium  Decreases ACh release  Penicillamine  May cause MG  Drugs with important interactions in MG  Cyclosporine  Broad range of drug interactions, which may raise or lower cyclosporine levels  Azathioprine  Avoid allopurinol; combination may result in myelosuppression.

## 2017-11-05 NOTE — Consult Note (Addendum)
[place] Nurse wound consult note Reason for Consult: Consult requested for BLE.  Pt is familiar to [facility] team from recent admission in 11/12. He has poor perfusion and has been followed by Dr Donnetta Hutching, according to the family member at the bedside.  Right ankle X-ray indicates, "Questionable small cortical erosion/focus of bone infection at the lateral fibular malleolar region on one view," this is beyond [facility] scope of practice.  Please refer to Vascular team for further orders regarding this complex medical condition. Wound type: Left heel with unstageable pressure injury; 2X2.5cm dry eschar without odor, drainage, or fluctuance. Tip of left great toe with .3X.3cm dry eschar without odor, drainage, or fluctuance. Right anterior foot with dark reddish-purple deep tissue injury; 3X3cm Right heel with unstageable pressure injury; 3X4cm dry eschar without odor, drainage, or fluctuance Right outer foot with unstageable pressure injury; 2X1.5cm and 5X2cm; dry eschar without odor, drainage, or fluctuance Right outer ankle with stage 4 pressure injury; 3X3X.2cm, bone palpable with swab, 20% yellow, 80% red, mod amt tan drainage, no odor Pressure Injury POA: Yes Dressing procedure/placement/frequency: Prevalon boots to reduce pressure to BLE.  It is best practice to leave dry stable eschar to feet; this is the case with left great toe, left heel, right outer foot wounds, and right heel. Santyl ointment to provide enzymatic debridement to right outer ankle wound.  Protect all other sites with kerlex.  Discussed plan of care with patient and wife at the bedside and they verbalized understanding. Please re-consult if further assistance is needed.  Thank-you,  Julien Girt MSN, [place], [place], [place], [place]

## 2017-11-05 NOTE — Progress Notes (Signed)
ANTICOAGULATION CONSULT NOTE - [place] for Heparin (when INR<2) Indication: atrial fibrillation  Allergies  Allergen Reactions  . Amlodipine Besylate Swelling    Angioedema   . Lisinopril Other (See Comments)    Renal insufficiency  . Statins Other (See Comments)    Muscle aches  . Actos [Pioglitazone]     Chest pain  . Ropinirole Other (See Comments)    Causes excessive grogginess the following morning after taking  . Tape Other (See Comments)    SKIN IS THIN AND TAPE WILL ACTUALLY TEAR IT; PLEASE USE COBAN WRAP INSTEAD!!    Patient Measurements: Height: 6' (182.9 cm) Weight: 175 lb (79.4 kg) IBW/kg (Calculated) : 77.6 Heparin Dosing Weight: 79.4 kg  Vital Signs: Temp: 98.4 F (36.9 C) (02/20 1226) Temp Source: Oral (02/20 1226) BP: 127/71 (02/20 1050) Pulse Rate: 108 (02/20 1050)  Labs: Recent Labs    11/04/17 1500 11/05/17 0637  HGB 10.4* 9.2*  HCT 33.6* 30.1*  PLT 402* 357  LABPROT 30.9* 34.3*  INR 3.00 3.42  CREATININE 5.57* 6.31*    Estimated Creatinine Clearance: 10.4 mL/min (A) (by C-G formula based on SCr of 6.31 mg/dL (H)).   Medical History: Past Medical History:  Diagnosis Date  . Anemia 07/28/2017  . Atrial fibrillation ([place])    on Coumadin  . CHF (congestive heart failure) ([place])   . DDD (degenerative disc disease)   . Diabetes mellitus without complication ([place])   . ESRD (end stage renal disease) on dialysis ([place])   . Hyperlipidemia   . Hypertension   . Melanoma ([place]) 2010   removed at [facility]  . Second degree heart block    a. s/p STJ dual chamber PPM - Dr [name]  . Stroke ([place]) 01/2017    Assessment: 81 YOM on warfarin PTA for hx Afib. Admit INR 3 on PTA dose of 1mg  daily. Now holding warfarin and pharmacy consulted to start Heparin when INR<2.   INR remains > 2 today.  Will continue to hold heparin.  Goal of Therapy:  INR 2-3 Heparin level 0.3-0.7 units/ml Monitor platelets by anticoagulation  protocol: Yes   Plan:  1. No heparin for now 2. Will plan to start when INR<2 3. F/u INR in the AM  Thank you for allowing pharmacy to be a part of this patient's care.  [employer], Pharm.D., BCPS Clinical Pharmacist Pager: [phone] Clinical phone for 11/05/2017 from 8:30-4:00 is [phone]. After 4pm, please call Main Rx [phone]) for assistance. 11/05/2017 4:21 PM

## 2017-11-05 NOTE — Evaluation (Signed)
Physical Therapy Evaluation Patient Details Name: James David MRN: [id number] DOB: 05-Jan-1936 Today's Date: 11/05/2017   History of Present Illness  Pt is a 82 y/o male male with history of myasthenia gravis on prednisone, diabetes mellitus, ESRD on hemodialysis on Monday Wednesday and Friday, atrial fibrillation, anemia, chronic lower extremity wounds who had recent arteriogram done by Dr. Oneida Alar presents to the ER with complaints of increasing weakness since morning.  Patient states that he was feeling generally weak. Patient also has benign chronic lower extremity wounds with ulceration. Vascular was consulted and recommended bilateral LE amputations.    Clinical Impression  Pt presented supine in bed with HOB elevated, awake and willing to participate in therapy session; however, lethargic throughout. Pt's wife present throughout session and providing most history information. Pt currently very limited secondary to generalized weakness. He requires mod-max A x2 for bed mobility and max A to total A to maintain upright sitting at EOB. Pt and pt's wife would really like for him to return home; however, pt has had a couple of falls with his wife at home during transfers. Do not feel that patient is safe to return home at this time unless the family is able to procure a mechanical lift to allow for safer transfers. Pt would continue to benefit from skilled physical therapy services at this time while admitted and after d/c to address the below listed limitations in order to improve overall safety and independence with functional mobility.    Follow Up Recommendations SNF;Supervision/Assistance - 24 hour;Other (comment)(pt's spouse really wants him to return home)    Equipment Recommendations  Other (comment)(mechanical lift)    Recommendations for Other Services Other (comment)(Palliative Consult)     Precautions / Restrictions Precautions Precautions: Fall Restrictions Weight Bearing  Restrictions: No      Mobility  Bed Mobility Overal bed mobility: Needs Assistance Bed Mobility: Sit to Supine;Rolling;Sidelying to Sit Rolling: Mod assist Sidelying to sit: Max assist   Sit to supine: Max assist;+2 for physical assistance   General bed mobility comments: increased time and effort, pt able to flex at hip and knee on R side to assist with rolling towards his L side; heavy physical assist to elevate trunk  Transfers                 General transfer comment: unable to attempt; pt lethargic and fatigued with sitting EOB ~2 minutes with max-total A  Ambulation/Gait                Stairs            Wheelchair Mobility    Modified Rankin (Stroke Patients Only)       Balance Overall balance assessment: Needs assistance;History of Falls Sitting-balance support: Feet supported;Bilateral upper extremity supported Sitting balance-Leahy Scale: Poor Sitting balance - Comments: max A to total A with bilateral UE supports                                     Pertinent Vitals/Pain Pain Assessment: No/denies pain    Home Living Family/patient expects to be discharged to:: Private residence Living Arrangements: Spouse/significant other Available Help at Discharge: Family;Available 24 hours/day   Home Access: Ramped entrance     Home Layout: One level Home Equipment: Hospital bed;Wheelchair - manual;Other (comment)(gait belt)      Prior Function Level of Independence: Needs assistance   Gait / Transfers Assistance Needed:  pt either in hospital bed or w/c all day; pt and pt's spouse performing transfers together with gait belt; pt reporting two falls in past 6 months during transfers; pt's spouse reported pt receives HHPT 2x/week  ADL's / Homemaking Assistance Needed: wife reports that either OT or aide assists patient with bathing/dressing        Hand Dominance        Extremity/Trunk Assessment   Upper Extremity  Assessment Upper Extremity Assessment: Generalized weakness    Lower Extremity Assessment Lower Extremity Assessment: Generalized weakness       Communication   Communication: No difficulties  Cognition Arousal/Alertness: Lethargic Behavior During Therapy: Flat affect Overall Cognitive Status: Impaired/Different from baseline Area of Impairment: Safety/judgement;Following commands                       Following Commands: Follows one step commands inconsistently;Follows one step commands with increased time Safety/Judgement: Decreased awareness of deficits;Decreased awareness of safety            General Comments      Exercises     Assessment/Plan    PT Assessment Patient needs continued PT services  PT Problem List Decreased strength;Decreased activity tolerance;Decreased balance;Decreased mobility;Decreased coordination;Decreased knowledge of use of DME;Decreased safety awareness;Decreased knowledge of precautions       PT Treatment Interventions DME instruction;Functional mobility training;Therapeutic activities;Therapeutic exercise;Balance training;Neuromuscular re-education;Cognitive remediation;Patient/family education    PT Goals (Current goals can be found in the Care Plan section)  Acute Rehab PT Goals Patient Stated Goal: return home PT Goal Formulation: With patient/family Time For Goal Achievement: 11/19/17 Potential to Achieve Goals: Fair    Frequency Min 2X/week   Barriers to discharge        Co-evaluation               AM-PAC PT "6 Clicks" Daily Activity  Outcome Measure Difficulty turning over in bed (including adjusting bedclothes, sheets and blankets)?: Unable Difficulty moving from lying on back to sitting on the side of the bed? : Unable Difficulty sitting down on and standing up from a chair with arms (e.g., wheelchair, bedside commode, etc,.)?: Unable Help needed moving to and from a bed to chair (including a  wheelchair)?: Total Help needed walking in hospital room?: Total Help needed climbing 3-5 steps with a railing? : Total 6 Click Score: 6    End of Session   Activity Tolerance: Patient limited by fatigue;Patient limited by lethargy Patient left: in bed;with call bell/phone within reach;with bed alarm set;with family/visitor present Nurse Communication: Mobility status;Need for lift equipment PT Visit Diagnosis: Muscle weakness (generalized) (M62.81);Other abnormalities of gait and mobility (R26.89)    Time: 5885-0277 PT Time Calculation (min) (ACUTE ONLY): 17 min   Charges:   PT Evaluation $PT Eval Moderate Complexity: 1 Mod     PT G Codes:        [place], PT, DPT [phone]   [place] 11/05/2017, 5:07 PM

## 2017-11-05 NOTE — Progress Notes (Signed)
Patient ID: James David, male   DOB: 10/26/1935, 82 y.o.   MRN: [id number]                                       [facility] Specialist [facility]  Patient name: James David MRN: [id number] DOB: April 24, 1936 Sex: male    HPI: James David is a 82 y.o. male admitted with generalized weakness.  I had seen him in our office on 11/10/2017 with bilateral lower extremity wound he has had a very difficult course in the past year with initiation of renal dialysis.  Had a stroke following this and also has been diagnosed with myasthenia gravis.  He is unable to walk and cannot stand for transfer.  His wife cares for him at their home.  One very difficult time caring for him in his current progressively debilitated state.  Has not had to call EMS never a number of occasions for recent falls.  He had episode of worsening generalized weakness and EMS was called and he was admitted to the hospital for further evaluation.  He did undergo outpatient arteriography following my visit with him on 10/22/2017.  This showed patency of his aorta iliac segments and also femoral and popliteal segments with severe tibial disease which was unreconstructable.  Past Medical History:  Diagnosis Date  . Anemia 07/28/2017  . Atrial fibrillation ([place])    on Coumadin  . CHF (congestive heart failure) ([place])   . DDD (degenerative disc disease)   . Diabetes mellitus without complication ([place])   . ESRD (end stage renal disease) on dialysis ([place])   . Hyperlipidemia   . Hypertension   . Melanoma ([place]) 2010   removed at [facility]  . Second degree heart block    a. s/p STJ dual chamber PPM - Dr [name]  . Stroke [facility]) 01/2017    Family History  Problem Relation Age of Onset  . Pulmonary embolism Mother   . Diabetes Mother   . Heart attack Father   . Diabetes Brother     SOCIAL HISTORY: Social History   Tobacco Use  . Smoking status: Never Smoker  . Smokeless tobacco: Never Used  Substance Use Topics  .  Alcohol use: No    Allergies  Allergen Reactions  . Amlodipine Besylate Swelling    Angioedema   . Lisinopril Other (See Comments)    Renal insufficiency  . Statins Other (See Comments)    Muscle aches  . Actos [Pioglitazone]     Chest pain  . Ropinirole Other (See Comments)    Causes excessive grogginess the following morning after taking  . Tape Other (See Comments)    SKIN IS THIN AND TAPE WILL ACTUALLY TEAR IT; PLEASE USE COBAN WRAP INSTEAD!!    Current Facility-Administered Medications  Medication Dose Route Frequency Provider Last Rate Last Dose  . acetaminophen (TYLENOL) tablet 650 mg  650 mg Oral Q6H PRN Rise Patience, MD       Or  . acetaminophen (TYLENOL) suppository 650 mg  650 mg Rectal Q6H PRN Rise Patience, MD      . aspirin EC tablet 81 mg  81 mg Oral QHS Rise Patience, MD      . calcium acetate (PHOSLO) capsule 1,334 mg  1,334 mg Oral TID WC Rise Patience, MD   1,334 mg at 11/05/17 1319  . carvedilol (COREG)  tablet 25 mg  25 mg Oral BID Rise Patience, MD   25 mg at 11/05/17 4765  . ceFEPIme (MAXIPIME) 2 g in sodium chloride 0.9 % 100 mL IVPB  2 g Intravenous Q M,W,F-2000 Rolla Flatten, [facility]      . collagenase (SANTYL) ointment   Topical Daily Patrecia Pour, Christean Grief, MD      . ezetimibe (ZETIA) tablet 10 mg  10 mg Oral Daily Rise Patience, MD   10 mg at 11/05/17 0913  . feeding supplement (NEPRO CARB STEADY) liquid 237 mL  237 mL Oral Q M,W,F Rise Patience, MD   237 mL at 11/05/17 0913  . fenofibrate tablet 160 mg  160 mg Oral Daily Rise Patience, MD   160 mg at 11/05/17 0913  . finasteride (PROSCAR) tablet 5 mg  5 mg Oral Daily Rise Patience, MD   5 mg at 11/05/17 0913  . folic acid-pyridoxine-cyancobalamin (FOLTX) 2.5-25-2 MG per tablet 1 tablet  1 tablet Oral QHS Rise Patience, MD      . gabapentin (NEURONTIN) capsule 100 mg  100 mg Oral Daily [name], RPH   100 mg at 11/05/17 4650     And  . gabapentin (NEURONTIN) capsule 200 mg  200 mg Oral QHS [name], RPH   200 mg at 11/05/17 3546  . hydrALAZINE (APRESOLINE) injection 10 mg  10 mg Intravenous Q4H PRN Rise Patience, MD      . insulin aspart (novoLOG) injection 0-9 Units  0-9 Units Subcutaneous TID WC Rise Patience, MD   3 Units at 11/05/17 1230  . insulin glargine (LANTUS) injection 5 Units  5 Units Subcutaneous QHS Patrecia Pour, Edwin, MD      . lanthanum [facility]) chewable tablet 1,000 mg  1,000 mg Oral TID WC Rise Patience, MD   1,000 mg at 11/05/17 1319  . Melatonin TABS 3 mg  3 mg Oral QHS Rise Patience, MD   3 mg at 11/05/17 5681  . ondansetron (ZOFRAN) tablet 4 mg  4 mg Oral Q6H PRN Rise Patience, MD       Or  . ondansetron [facility]) injection 4 mg  4 mg Intravenous Q6H PRN Rise Patience, MD      . predniSONE (DELTASONE) tablet 20 mg  20 mg Oral Q breakfast Rise Patience, MD   20 mg at 11/05/17 2751    REVIEW OF SYSTEMS:  [X]  denotes positive finding, [ ]  denotes negative finding Cardiac  Comments:  Chest pain or chest pressure:    Shortness of breath upon exertion:    Short of breath when lying flat: x   Irregular heart rhythm:        Vascular    Pain in calf, thigh, or hip brought on by ambulation:    Pain in feet at night that wakes you up from your sleep:     Blood clot in your veins:    Leg swelling:           PHYSICAL EXAM: Vitals:   11/05/17 0916 11/05/17 0917 11/05/17 1050 11/05/17 1226  BP:   127/71   Pulse: 91 88 (!) 108   Resp: (!) 23 13 (!) 25   Temp:   98 F (36.7 C) 98.4 F (36.9 C)  TempSrc:   Oral Oral  SpO2: 99% 98% 94%   Weight:      Height:        GENERAL: The  patient is a well-nourished male, in no acute distress. The vital signs are documented above. CARDIOVASCULAR: Palpable femoral pulses and faint popliteal pulses bilaterally PULMONARY: There is good air exchange  MUSCULOSKELETAL: There are no major  deformities or cyanosis. NEUROLOGIC: No focal weakness or paresthesias are detected. SKIN: Full-thickness dry heel ulcers bilaterally.  He has an open wound over his right lateral malleolus which does not appear to be a infected.  He has dry gangrenous changes over the foot PSYCHIATRIC: The patient has a normal affect.  DATA:  Arteriogram from 10/22/2017 as noted  MEDICAL ISSUES: I had a long discussion with the patient and his wife present.  He has had progressive changes of his feet.  I explained that he will come to bilateral amputations.  He could potentially heal below-knee amputations but this would make no difference in his quality of life since he is complete lift from bed to chair.  I would recommend above-knee amputation with much higher chance for primary healing of these.  I do not see any evidence of sepsis or feel that this is causing any of the worsening systemic weakness and difficulty that he is having.  Would recommend social work consult to help with home needs with or without amputations.  Also would consider hospice and palliative care care consult to discuss goals of care with the patient and his wife.  All I have to offer would be bilateral above-knee amputations.  Will follow with you.    Rosetta Posner, MD FACS [facility] Tel [phone] Pager [phone]

## 2017-11-05 NOTE — Progress Notes (Signed)
Neurology Progress Note   S:// No new complaints.  O:// Current vital signs: BP (!) 156/56   Pulse 88   Temp 98.5 F (36.9 C) (Oral)   Resp (!) 25   Ht 6' (1.829 m)   Wt 79.4 kg (175 lb)   SpO2 99%   BMI 23.73 kg/m   Vital signs in last 24 hours: Temp:  [98 F (36.7 C)-98.7 F (37.1 C)] 98.5 F (36.9 C) (02/20 0814) Pulse Rate:  [86-95] 88 (02/20 0517) Resp:  [14-28] 25 (02/20 0517) BP: (129-159)/(56-88) 156/56 (02/20 0517) SpO2:  [94 %-100 %] 99 % (02/20 0517) Weight:  [79.4 kg (175 lb)] 79.4 kg (175 lb) (02/19 1333) GENERAL: Awake, alert in NAD HEENT: - Normocephalic and atraumatic, dry mm, no LN++, no Thyromegally LUNGS - Clear to auscultation bilaterally with no wheezes CV - S1S2 RRR, no m/r/g, equal pulses bilaterally. ABDOMEN - Soft, nontender, nondistended with normoactive BS Ext: Right foot in bandage. NEURO:  Mental Status: AA&Ox3 Language: speech is clear.  Naming, repetition, fluency, and comprehension intact. Cranial Nerves: PERRL. EOMI, visual fields full, no facial asymmetry, Motor: Upper extremities 5/5 bilaterally with some asterixis.  4/5 bilateral lower extremities with some increased tone in the right leg.  Unchanged from exam from yesterday. Sensation- Intact to light touch bilaterally Coordination: Bilateral asterixis. Gait- deferred DTRs 2+ symmetric  Medications  Current Facility-Administered Medications:  .  acetaminophen (TYLENOL) tablet 650 mg, 650 mg, Oral, Q6H PRN **OR** acetaminophen (TYLENOL) suppository 650 mg, 650 mg, Rectal, Q6H PRN, Rise Patience, MD .  aspirin EC tablet 81 mg, 81 mg, Oral, QHS, Rise Patience, MD .  calcium acetate (PHOSLO) capsule 1,334 mg, 1,334 mg, Oral, TID WC, Rise Patience, MD, 1,334 mg at 11/05/17 0800 .  carvedilol (COREG) tablet 25 mg, 25 mg, Oral, BID, Rise Patience, MD, 25 mg at 11/05/17 7106 .  ceFEPIme (MAXIPIME) 2 g in sodium chloride 0.9 % 100 mL IVPB, 2 g, Intravenous, Q  M,W,F-2000, Rolla Flatten, RPH .  ezetimibe (ZETIA) tablet 10 mg, 10 mg, Oral, Daily, Rise Patience, MD, 10 mg at 11/05/17 0913 .  feeding supplement (NEPRO CARB STEADY) liquid 237 mL, 237 mL, Oral, Q M,W,F, Rise Patience, MD, 237 mL at 11/05/17 0913 .  fenofibrate tablet 160 mg, 160 mg, Oral, Daily, Rise Patience, MD, 160 mg at 11/05/17 0913 .  finasteride (PROSCAR) tablet 5 mg, 5 mg, Oral, Daily, Rise Patience, MD, 5 mg at 11/05/17 0913 .  folic acid-pyridoxine-cyancobalamin (FOLTX) 2.5-25-2 MG per tablet 1 tablet, 1 tablet, Oral, QHS, Rise Patience, MD .  gabapentin (NEURONTIN) capsule 100 mg, 100 mg, Oral, Daily, 100 mg at 11/05/17 0912 **AND** gabapentin (NEURONTIN) capsule 200 mg, 200 mg, Oral, QHS, Masters, Jake Church, RPH, 200 mg at 11/05/17 2694 .  hydrALAZINE (APRESOLINE) injection 10 mg, 10 mg, Intravenous, Q4H PRN, Rise Patience, MD .  insulin aspart (novoLOG) injection 0-9 Units, 0-9 Units, Subcutaneous, TID WC, Rise Patience, MD, 2 Units at 11/05/17 0800 .  lanthanum (FOSRENOL) chewable tablet 1,000 mg, 1,000 mg, Oral, TID WC, Rise Patience, MD, 1,000 mg at 11/05/17 0800 .  Melatonin TABS 3 mg, 3 mg, Oral, QHS, Rise Patience, MD, 3 mg at 11/05/17 8546 .  ondansetron (ZOFRAN) tablet 4 mg, 4 mg, Oral, Q6H PRN **OR** ondansetron (ZOFRAN) injection 4 mg, 4 mg, Intravenous, Q6H PRN, Rise Patience, MD .  predniSONE (DELTASONE) tablet 20 mg, 20 mg, Oral,  Q breakfast, Rise Patience, MD, 20 mg at 11/05/17 0912 Labs CBC    Component Value Date/Time   WBC 11.8 (H) 11/05/2017 0637   RBC 2.87 (L) 11/05/2017 0637   HGB 9.2 (L) 11/05/2017 0637   HGB 12.0 (L) 10/17/2016 1303   HCT 30.1 (L) 11/05/2017 0637   HCT 34.4 (L) 10/17/2016 1303   PLT 357 11/05/2017 0637   PLT 198 10/17/2016 1303   MCV 104.9 (H) 11/05/2017 0637   MCV 86.6 10/17/2016 1303   MCH 32.1 11/05/2017 0637   MCHC 30.6 11/05/2017 0637   RDW 18.0  (H) 11/05/2017 0637   RDW 13.3 10/17/2016 1303   LYMPHSABS 0.9 11/04/2017 1500   LYMPHSABS 1.0 10/17/2016 1303   MONOABS 0.8 11/04/2017 1500   MONOABS 0.5 10/17/2016 1303   EOSABS 0.0 11/04/2017 1500   EOSABS 0.2 10/17/2016 1303   BASOSABS 0.0 11/04/2017 1500   BASOSABS 0.0 10/17/2016 1303    CMP     Component Value Date/Time   NA 133 (L) 11/05/2017 0637   NA 140 02/28/2017 0916   NA 138 10/17/2016 1303   K 3.6 11/05/2017 0637   K 5.1 10/17/2016 1303   CL 93 (L) 11/05/2017 0637   CO2 22 11/05/2017 0637   CO2 18 (L) 10/17/2016 1303   GLUCOSE 191 (H) 11/05/2017 0637   GLUCOSE 197 (H) 10/17/2016 1303   BUN 62 (H) 11/05/2017 0637   BUN 46 (H) 02/28/2017 0916   BUN 71.0 (H) 10/17/2016 1303   CREATININE 6.31 (H) 11/05/2017 0637   CREATININE 5.9 (HH) 10/17/2016 1303   CALCIUM 9.1 11/05/2017 0637   CALCIUM 8.9 10/17/2016 1303   PROT 6.7 11/04/2017 1500   PROT 6.2 09/23/2017 1513   PROT 6.2 (L) 10/17/2016 1303   ALBUMIN 2.4 (L) 11/04/2017 1500   ALBUMIN 3.2 (L) 02/28/2017 0916   ALBUMIN 2.9 (L) 10/17/2016 1303   AST 22 11/04/2017 1500   AST 8 10/17/2016 1303   ALT 23 11/04/2017 1500   ALT 13 10/17/2016 1303   ALKPHOS 49 11/04/2017 1500   ALKPHOS 95 10/17/2016 1303   BILITOT 0.7 11/04/2017 1500   BILITOT 0.2 02/28/2017 0916   BILITOT 0.24 10/17/2016 1303   GFRNONAA 7 (L) 11/05/2017 0637   GFRAA 9 (L) 11/05/2017 1610    Imaging I have reviewed images in epic and the results pertinent to this consultation are: CT head unremarkable.  Assessment:  82 year old with progressive weakness in the setting of antibody positive myasthenia presenting with generalized weakness.  Also has a focal infection from a nonhealing ulcer on the foot. His current symptoms are probably a pseudo-exacerbation in the setting of an infection. Also contributing to it.  He has deranged renal function. Per chart review, he was hyporeflexive and lower extremities in November and now is not  hyperreflexic and has atrophy of some lower extremity muscles as well-consider myelopathy as an etiology.  Impression: --Generalized weakness in a patient with myasthenic gravis in the setting of an infection. --Concern for myelopathy  Recommendations: Continue prednisone Avoid gabapentin and narcotics Management of the toxic metabolic derangements per primary team as you are Neurosurgical opinion on him being a good surgical candidate for not which would guide obtaining a CT mammogram.  MRI is not possible because of her pacemaker. NIF every 12 hours Avoid medications that can exacerbate myasthenia as in the prior note.  -- Amie Portland, MD [facility] Pager: [phone] If 7pm to 7am, please call on call as listed on AMION.

## 2017-11-06 LAB — CBC WITH DIFFERENTIAL/PLATELET
Basophils Absolute: 0 10*3/uL (ref 0.0–0.1)
Basophils Relative: 0 %
EOS ABS: 0 10*3/uL (ref 0.0–0.7)
Eosinophils Relative: 0 %
HCT: 28 % — ABNORMAL LOW (ref 39.0–52.0)
HEMOGLOBIN: 8.7 g/dL — AB (ref 13.0–17.0)
LYMPHS ABS: 1.2 10*3/uL (ref 0.7–4.0)
Lymphocytes Relative: 11 %
MCH: 31.9 pg (ref 26.0–34.0)
MCHC: 31.1 g/dL (ref 30.0–36.0)
MCV: 102.6 fL — ABNORMAL HIGH (ref 78.0–100.0)
MONOS PCT: 6 %
Monocytes Absolute: 0.7 10*3/uL (ref 0.1–1.0)
NEUTROS ABS: 8.9 10*3/uL — AB (ref 1.7–7.7)
NEUTROS PCT: 83 %
Platelets: 371 10*3/uL (ref 150–400)
RBC: 2.73 MIL/uL — AB (ref 4.22–5.81)
RDW: 17.8 % — ABNORMAL HIGH (ref 11.5–15.5)
WBC: 10.8 10*3/uL — AB (ref 4.0–10.5)

## 2017-11-06 LAB — GLUCOSE, CAPILLARY
GLUCOSE-CAPILLARY: 130 mg/dL — AB (ref 65–99)
GLUCOSE-CAPILLARY: 312 mg/dL — AB (ref 65–99)
GLUCOSE-CAPILLARY: 237 mg/dL — AB (ref 65–99)
Glucose-Capillary: 136 mg/dL — ABNORMAL HIGH (ref 65–99)

## 2017-11-06 LAB — CBC
HCT: 31.8 % — ABNORMAL LOW (ref 39.0–52.0)
Hemoglobin: 9.8 g/dL — ABNORMAL LOW (ref 13.0–17.0)
MCH: 31.5 pg (ref 26.0–34.0)
MCHC: 30.8 g/dL (ref 30.0–36.0)
MCV: 102.3 fL — ABNORMAL HIGH (ref 78.0–100.0)
Platelets: 372 10*3/uL (ref 150–400)
RBC: 3.11 MIL/uL — ABNORMAL LOW (ref 4.22–5.81)
RDW: 17.3 % — ABNORMAL HIGH (ref 11.5–15.5)
WBC: 11.3 10*3/uL — ABNORMAL HIGH (ref 4.0–10.5)

## 2017-11-06 LAB — BASIC METABOLIC PANEL
Anion gap: 14 (ref 5–15)
BUN: 74 mg/dL — AB (ref 6–20)
CO2: 24 mmol/L (ref 22–32)
CREATININE: 6.98 mg/dL — AB (ref 0.61–1.24)
Calcium: 9.1 mg/dL (ref 8.9–10.3)
Chloride: 91 mmol/L — ABNORMAL LOW (ref 101–111)
GFR calc Af Amer: 8 mL/min — ABNORMAL LOW (ref >60)
GFR calc non Af Amer: 7 mL/min — ABNORMAL LOW (ref >60)
Glucose, Bld: 170 mg/dL — ABNORMAL HIGH (ref 65–99)
POTASSIUM: 3.6 mmol/L (ref 3.5–5.1)
SODIUM: 129 mmol/L — AB (ref 135–145)

## 2017-11-06 LAB — RENAL FUNCTION PANEL
Albumin: 2.2 g/dL — ABNORMAL LOW (ref 3.5–5.0)
Anion gap: 13 (ref 5–15)
BUN: 22 mg/dL — ABNORMAL HIGH (ref 6–20)
CO2: 26 mmol/L (ref 22–32)
Calcium: 8.5 mg/dL — ABNORMAL LOW (ref 8.9–10.3)
Chloride: 94 mmol/L — ABNORMAL LOW (ref 101–111)
Creatinine, Ser: 3.25 mg/dL — ABNORMAL HIGH (ref 0.61–1.24)
GFR calc Af Amer: 19 mL/min — ABNORMAL LOW (ref >60)
GFR calc non Af Amer: 17 mL/min — ABNORMAL LOW (ref >60)
Glucose, Bld: 139 mg/dL — ABNORMAL HIGH (ref 65–99)
Phosphorus: 1.7 mg/dL — ABNORMAL LOW (ref 2.5–4.6)
Potassium: 3.9 mmol/L (ref 3.5–5.1)
Sodium: 133 mmol/L — ABNORMAL LOW (ref 135–145)

## 2017-11-06 LAB — PROTIME-INR
INR: 3.56
Prothrombin Time: 35.3 seconds — ABNORMAL HIGH (ref 11.4–15.2)

## 2017-11-06 LAB — HEPATITIS B SURFACE ANTIGEN: Hepatitis B Surface Ag: NEGATIVE

## 2017-11-06 MED ORDER — DOXERCALCIFEROL 4 MCG/2ML IV SOLN
1.0000 ug | INTRAVENOUS | Status: DC
  Administered 2017-11-07 – 2017-11-10 (×2): 1 ug via INTRAVENOUS
  Filled 2017-11-06 (×3): qty 2

## 2017-11-06 MED ORDER — SODIUM CHLORIDE 0.9 % IV SOLN: 100.0000 mL | INTRAVENOUS | Status: DC | PRN

## 2017-11-06 MED ORDER — VANCOMYCIN HCL IN DEXTROSE 750-5 MG/150ML-% IV SOLN
750.0000 mg | INTRAVENOUS | Status: AC
  Administered 2017-11-06: 750 mg via INTRAVENOUS
  Filled 2017-11-06: qty 150

## 2017-11-06 MED ORDER — LIDOCAINE-PRILOCAINE 2.5-2.5 % EX CREA
1.0000 "application " | TOPICAL_CREAM | CUTANEOUS | Status: DC | PRN
  Filled 2017-11-06: qty 5

## 2017-11-06 MED ORDER — PENTAFLUOROPROP-TETRAFLUOROETH EX AERO: 1.0000 "application " | INHALATION_SPRAY | CUTANEOUS | Status: DC | PRN

## 2017-11-06 MED ORDER — LIDOCAINE HCL (PF) 1 % IJ SOLN
5.0000 mL | INTRAMUSCULAR | Status: DC | PRN
  Filled 2017-11-06: qty 5

## 2017-11-06 NOTE — Consult Note (Addendum)
[name] Renal Consultation Note    Indication for Consultation:  Management of ESRD/hemodialysis; anemia, hypertension/volume and secondary hyperparathyroidism PCP: Dr. Lona Kettle  HPI: James David is a 82 y.o. male with ESRD on hemodialysis MWF at [facility].  PMH of HTN, DMT2, CHF, HLD, Afib on coumadin, CVA, myasthenia gravis, AOCD, SHPT. Nonhealing LE wounds followed by [facility].   Patient presented to ED 11/04/17 with C/O of generalized weakness. Patient reports he has progressive weakness over past month. He also has bilateral LE non-healing ulcers for which he has been following with [facility]. Upon arrival to hospital, he was afebrile, WBC 13.3 Lactic acid 1.36 CXR WNL. Xray of R ankle showed small cortical erosion/focus of bone infection at the lateral fibular malleolar region on one view. Patient was admitted for cellulitis of RLE. Neurology has been consulted, does not feel weakness is related to myasthenia gravis crisis. He has been seen by Dr.Early who has recommended bilateral AKA. He is being followed by wound care.   Currently patient is awake, alert, in NAD. He says his largest complaints have been weakness and ulcers on LE. Denies chest pain, SOB, fever, chills, N,V,D. Denies recent URI or sick exposures other than at HD unit. Last HD 11/03/2017. He ran full treatment, left 0.5 kg above OP EDW.     Past Medical History:  Diagnosis Date  . Anemia 07/28/2017  . Atrial fibrillation ([place])    on Coumadin  . CHF (congestive heart failure) ([place])   . DDD (degenerative disc disease)   . Diabetes mellitus without complication ([place])   . ESRD (end stage renal disease) on dialysis ([place])   . Hyperlipidemia   . Hypertension   . Melanoma ([place]) 2010   removed at [facility]  . Second degree heart block    a. s/p STJ dual chamber PPM - Dr [name]  . Stroke [facility]) 01/2017   Past Surgical History:  Procedure Laterality Date  . A/V FISTULAGRAM N/A 06/11/2017    Procedure: A/V Fistulagram;  Surgeon: Waynetta Sandy, MD;  Location: [place] CV LAB;  Service: Cardiovascular;  Laterality: N/A;  . ABDOMINAL AORTOGRAM N/A 10/22/2017   Procedure: ABDOMINAL AORTOGRAM;  Surgeon: Elam Dutch, MD;  Location: [place] CV LAB;  Service: Cardiovascular;  Laterality: N/A;  . AV FISTULA PLACEMENT Right 05/31/2014   Procedure: Right Arm Brachiocephalic ARTERIOVENOUS FISTULA CREATION  ;  Surgeon: Conrad Mucarabones, MD;  Location: [place];  Service: Vascular;  Laterality: Right;  . CATARACT EXTRACTION W/ INTRAOCULAR LENS  IMPLANT, BILATERAL    . COLONOSCOPY    . EXCISION MELANOMA WITH SENTINEL LYMPH NODE BIOPSY Right 02/08/2016   Procedure: WIDE EXCISION RIGHT SHOULDER MELANOMA WITH RIGHT SENTINEL LYMPH NODE BIOPSY;  Surgeon: [name], MD;  Location: [place];  Service: General;  Laterality: Right;  . LAMINECTOMY    . LOWER EXTREMITY ANGIOGRAPHY Bilateral 10/22/2017   Procedure: Lower Extremity Angiography;  Surgeon: Elam Dutch, MD;  Location: [place] CV LAB;  Service: Cardiovascular;  Laterality: Bilateral;  . PERIPHERAL VASCULAR BALLOON ANGIOPLASTY Right 06/11/2017   Procedure: PERIPHERAL VASCULAR BALLOON ANGIOPLASTY;  Surgeon: Waynetta Sandy, MD;  Location: [place] CV LAB;  Service: Cardiovascular;  Laterality: Right;  arm fistula  . PERMANENT PACEMAKER INSERTION N/A 01/20/2014   STJ Assurity dual chamber pacemaker implanted by Dr [name] for 2nd degree AV block   Family History  Problem Relation Age of Onset  . Pulmonary embolism Mother   . Diabetes Mother   .  Heart attack Father   . Diabetes Brother    Social History:  reports that  has never smoked. he has never used smokeless tobacco. He reports that he does not drink alcohol or use drugs. Allergies  Allergen Reactions  . Amlodipine Besylate Swelling    Angioedema   . Lisinopril Other (See Comments)    Renal insufficiency  . Statins Other (See Comments)    Muscle  aches  . Actos [Pioglitazone]     Chest pain  . Ropinirole Other (See Comments)    Causes excessive grogginess the following morning after taking  . Tape Other (See Comments)    SKIN IS THIN AND TAPE WILL ACTUALLY TEAR IT; PLEASE USE COBAN WRAP INSTEAD!!   Prior to Admission medications   Medication Sig Start Date End Date Taking? Authorizing Provider  acetaminophen (TYLENOL) 500 MG tablet Take 500-1,000 mg by mouth daily as needed for moderate pain or headache.    Yes [provider]  ARTIFICIAL TEAR OP Place 1 drop into both eyes daily.   Yes [provider]  aspirin EC 81 MG tablet Take 81 mg by mouth at bedtime.   Yes [provider]  calcium acetate (PHOSLO) 667 MG capsule Take 667-1,334 mg by mouth See admin instructions. Take 1334 mg by mouth three times daily with meals, take 1 capsule (667 mg) with snacks   Yes [provider]  carvedilol (COREG) 25 MG tablet Take 25 mg by mouth 2 (two) times daily.   Yes [provider]  ENSURE (ENSURE) Take 1 Can by mouth every other day. On non dialysis days   Yes [provider]  ezetimibe (ZETIA) 10 MG tablet Take 1 tablet (10 mg total) by mouth daily. 02/14/17  Yes Angiulli, Lavon Paganini, PA-C  fenofibrate 160 MG tablet Take 160 mg by mouth daily.   Yes [provider]  finasteride (PROSCAR) 5 MG tablet Take 1 tablet (5 mg total) by mouth daily. 02/14/17  Yes Angiulli, Lavon Paganini, PA-C  folic acid-vitamin b complex-vitamin c-selenium-zinc (DIALYVITE) 3 MG TABS tablet Take 1 tablet at bedtime by mouth.   Yes [provider]  gabapentin (NEURONTIN) 100 MG capsule Take one tablet in the morning and two tablets at night. 10/06/17  Yes Sater, Nanine Means, MD  glimepiride (AMARYL) 1 MG tablet Take 1 tablet (1 mg total) by mouth daily with breakfast. Patient taking differently: Take 1-2 mg by mouth daily with breakfast. Take 2mg  once daily if blood sugar is 150 or higher 02/14/17  Yes Angiulli,  Lavon Paganini, PA-C  lanthanum (FOSRENOL) 1000 MG chewable tablet Chew 1,000 mg by mouth 3 (three) times daily with meals.   Yes [provider]  Melatonin 3 MG TABS Take 3 mg at bedtime by mouth.   Yes [provider]  Nutritional Supplements (FEEDING SUPPLEMENT, NEPRO CARB STEADY,) LIQD Take 237 mLs by mouth 2 (two) times daily between meals. Patient taking differently: Take 237 mLs by mouth every Monday, Wednesday, and Friday. At dialysis 03/25/17  Yes Theodis Blaze, MD  povidone-iodine (BETADINE) 10 % ointment Apply 1 application topically 3 (three) times a week.   Yes [provider]  predniSONE (DELTASONE) 20 MG tablet Take 1 tablet (20 mg total) by mouth daily with breakfast. Please note to continue taking 20 mg tablet for now  and you must be evaluated by your neurologist (Dr. [name]) to determine next Prednisone dose and frequency. 03/26/17  Yes Bonnielee Haff, MD  traMADol (ULTRAM) 50 MG  tablet Take 50-100 mg by mouth 2 (two) times daily as needed for moderate pain.   Yes [provider]  warfarin (COUMADIN) 1 MG tablet 1 1/2 tab daily Patient taking differently: Take 1 mg by mouth daily with supper.  02/14/17  Yes Angiulli, Lavon Paganini, PA-C  amoxicillin-clavulanate (AUGMENTIN) 875-125 MG tablet Take 1 tablet by mouth 2 (two) times daily. One po bid x 7 days Patient not taking: Reported on 10/21/2017 08/27/17   Charlesetta Shanks, MD  ceFEPIme 2 g in dextrose 5 % 50 mL Inject 2 g every Monday, Wednesday, and Friday at 6 PM into the vein. Patient not taking: Reported on 10/21/2017 08/01/17   Geradine Girt, DO  collagenase (SANTYL) ointment Apply daily topically. Patient not taking: Reported on 10/21/2017 08/01/17   Geradine Girt, DO  HYDROcodone-acetaminophen (NORCO/VICODIN) 5-325 MG tablet Take 1-2 tablets by mouth every 4 (four) hours as needed for moderate pain or severe pain. Patient not taking: Reported on 10/21/2017 08/27/17   Charlesetta Shanks, MD   Current  Facility-Administered Medications  Medication Dose Route Frequency Provider Last Rate Last Dose  . acetaminophen (TYLENOL) tablet 650 mg  650 mg Oral Q6H PRN Rise Patience, MD   650 mg at 11/06/17 0430   Or  . acetaminophen (TYLENOL) suppository 650 mg  650 mg Rectal Q6H PRN Rise Patience, MD      . aspirin chewable tablet 81 mg  81 mg Oral Daily Patrecia Pour, Christean Grief, MD   81 mg at 11/06/17 6967  . calcium acetate (PHOSLO) capsule 1,334 mg  1,334 mg Oral TID WC Rise Patience, MD   1,334 mg at 11/06/17 1216  . carvedilol (COREG) tablet 25 mg  25 mg Oral BID Rise Patience, MD   25 mg at 11/06/17 8938  . ceFEPIme (MAXIPIME) 1 g in sodium chloride 0.9 % 100 mL IVPB  1 g Intravenous Q24H Bajbus, Lauren D, RPH   Stopped at 11/05/17 2139  . collagenase (SANTYL) ointment   Topical Daily Patrecia Pour, Christean Grief, MD      . Derrill Memo ON 11/07/2017] doxercalciferol (HECTOROL) injection 1 mcg  1 mcg Intravenous Q M,W,F-HD Valentina Gu, NP      . ezetimibe (ZETIA) tablet 10 mg  10 mg Oral Daily Rise Patience, MD   10 mg at 11/06/17 1017  . feeding supplement (NEPRO CARB STEADY) liquid 237 mL  237 mL Oral Q M,W,F Rise Patience, MD   237 mL at 11/05/17 0913  . fenofibrate tablet 160 mg  160 mg Oral Daily Rise Patience, MD   160 mg at 11/06/17 5102  . finasteride (PROSCAR) tablet 5 mg  5 mg Oral Daily Rise Patience, MD   5 mg at 11/06/17 5852  . folic acid-pyridoxine-cyancobalamin (FOLTX) 2.5-25-2 MG per tablet 1 tablet  1 tablet Oral QHS Rise Patience, MD   1 tablet at 11/05/17 2104  . gabapentin (NEURONTIN) capsule 100 mg  100 mg Oral Daily [name], RPH   100 mg at 11/06/17 7782   And  . gabapentin (NEURONTIN) capsule 200 mg  200 mg Oral QHS [name], RPH   200 mg at 11/05/17 2104  . hydrALAZINE (APRESOLINE) injection 10 mg  10 mg Intravenous Q4H PRN Rise Patience, MD      . insulin aspart (novoLOG) injection 0-9 Units  0-9  Units Subcutaneous TID WC Rise Patience, MD   7 Units at 11/06/17 1221  .  insulin glargine (LANTUS) injection 5 Units  5 Units Subcutaneous QHS Patrecia Pour, Christean Grief, MD   5 Units at 11/05/17 2104  . lanthanum (FOSRENOL) chewable tablet 1,000 mg  1,000 mg Oral TID WC Rise Patience, MD   1,000 mg at 11/06/17 1217  . Melatonin TABS 3 mg  3 mg Oral QHS Rise Patience, MD   3 mg at 11/05/17 2104  . ondansetron (ZOFRAN) tablet 4 mg  4 mg Oral Q6H PRN Rise Patience, MD       Or  . ondansetron [facility]) injection 4 mg  4 mg Intravenous Q6H PRN Rise Patience, MD      . predniSONE (DELTASONE) tablet 20 mg  20 mg Oral Q breakfast Rise Patience, MD   20 mg at 11/06/17 1517  . vancomycin (VANCOCIN) IVPB 750 mg/150 ml premix  750 mg Intravenous Q M,W,F-HD Bajbus, Lauren Keturah Barre, [facility]       Labs: Basic Metabolic Panel: Recent Labs  Lab 11/04/17 1500 11/05/17 0637 11/06/17 0312  NA 132* 133* 129*  K 3.8 3.6 3.6  CL 89* 93* 91*  CO2 26 22 24   GLUCOSE 353* 191* 170*  BUN 54* 62* 74*  CREATININE 5.57* 6.31* 6.98*  CALCIUM 9.4 9.1 9.1   Liver Function Tests: Recent Labs  Lab 11/04/17 1500  AST 22  ALT 23  ALKPHOS 49  BILITOT 0.7  PROT 6.7  ALBUMIN 2.4*   No results for input(s): LIPASE, AMYLASE in the last 168 hours. No results for input(s): AMMONIA in the last 168 hours. CBC: Recent Labs  Lab 11/04/17 1500 11/05/17 0637 11/06/17 0312  WBC 13.3* 11.8* 10.8*  NEUTROABS 11.6*  --  8.9*  HGB 10.4* 9.2* 8.7*  HCT 33.6* 30.1* 28.0*  MCV 104.0* 104.9* 102.6*  PLT 402* 357 371   Cardiac Enzymes: No results for input(s): CKTOTAL, CKMB, CKMBINDEX, TROPONINI in the last 168 hours. CBG: Recent Labs  Lab 11/05/17 1225 11/05/17 1703 11/05/17 2152 11/06/17 0756 11/06/17 1220  GLUCAP 213* 254* 285* 130* 312*   Iron Studies: No results for input(s): IRON, TIBC, TRANSFERRIN, FERRITIN in the last 72 hours. Studies/Results: Dg Chest 2 View  Result Date:  11/04/2017 CLINICAL DATA:  Fatigue, weakness, history of myasthenia gravis EXAM: CHEST  2 VIEW COMPARISON:  07/27/2017 FINDINGS: Surgical clips over the left chest. Left-sided pacing device, similar compared to previous exam. No significant pleural effusion. No acute consolidation. Stable enlarged cardiomediastinal silhouette with aortic atherosclerosis. No pneumothorax. Vascular stent in the right axillary region. Flowing osteophytes of the spine. IMPRESSION: No active cardiopulmonary disease.  Cardiomegaly. Electronically Signed   By: Donavan Foil M.D.   On: 11/04/2017 18:13   Dg Ankle 2 Views Left  Result Date: 11/04/2017 CLINICAL DATA:  Ankle pain EXAM: LEFT ANKLE - 2 VIEW COMPARISON:  None. FINDINGS: Only a single lateral view of the ankle is submitted. According to the notes, patient unable to hold position for AP view. Large plantar calcaneal spur and enthesophytes. Vascular calcification. No obvious acute abnormality IMPRESSION: 1. Limited single lateral view shows no acute abnormality 2. Large plantar calcaneal spur. Electronically Signed   By: Donavan Foil M.D.   On: 11/04/2017 18:42   Dg Ankle Complete Right  Result Date: 11/04/2017 CLINICAL DATA:  Worsening foot wounds EXAM: RIGHT ANKLE - COMPLETE 3+ VIEW COMPARISON:  06/28/2016 FINDINGS: No acute displaced fracture or malalignment is seen. Possible small erosion at the fibular malleolar tip on one view only. There is lucency and thinning  of the soft tissues over the fibular malleolus suspected to represent an ulcer. Mild degenerative changes of the medial ankle joint. Vascular calcifications. Large calcaneal spur and enthesophyte. Soft tissue thickening posterior calcaneal region. No soft tissue gas IMPRESSION: 1. Questionable small cortical erosion/focus of bone infection at the lateral fibular malleolar region on one view; there is thinning of the soft tissues and a lucency overlying the fibular malleolus, presumably representing an ulcer or  wound. 2. Large plantar calcaneal spurs. Electronically Signed   By: Donavan Foil M.D.   On: 11/04/2017 18:19   Dg Foot Complete Left  Result Date: 11/04/2017 CLINICAL DATA:  Bilateral foot wounds right and left heel EXAM: LEFT FOOT - COMPLETE 3+ VIEW COMPARISON:  None. FINDINGS: No acute fracture or malalignment. Moderate arthritis at the first MTP joint. Screw fixation of the head of fifth metatarsal with chronic deformity noted. Vascular calcifications. Large calcaneal spurs and enthesophyte. No soft tissue gas. No radiopaque foreign body IMPRESSION: 1. No acute osseous abnormality 2. Postsurgical changes of the distal fifth metatarsal with evidence of old fracture deformity 3. Large calcaneal spurs Electronically Signed   By: Donavan Foil M.D.   On: 11/04/2017 18:15   Dg Foot Complete Right  Result Date: 11/04/2017 CLINICAL DATA:  Foot wounds EXAM: RIGHT FOOT COMPLETE - 3+ VIEW COMPARISON:  None. FINDINGS: No acute displaced fracture or malalignment is seen. Extensive vascular calcifications. Possible old fracture deformity involving the proximal shaft and base of the third metatarsal. Large plantar calcaneal spur. No soft tissue gas. IMPRESSION: 1. No acute osseous abnormality 2. Large plantar calcaneal spur Electronically Signed   By: Donavan Foil M.D.   On: 11/04/2017 18:23    ROS: As per HPI otherwise negative.   Physical Exam: Vitals:   11/05/17 2113 11/05/17 2250 11/06/17 0406 11/06/17 1221  BP:  (!) 140/57    Pulse:  81    Resp:  (!) 22    Temp: 98.3 F (36.8 C)  (!) 97.5 F (36.4 C) 97.8 F (36.6 C)  TempSrc: Oral  Oral   SpO2:  94%    Weight:   76.8 kg (169 lb 5 oz)   Height:         General: Chronically ill appearing very pleasant elderly male in NAD Head: Normocephalic, atraumatic, sclera non-icteric, mucus membranes are moist Neck: Supple. JVD not elevated. Lungs: Clear bilaterally to auscultation without wheezes, rales, or rhonchi. Breathing is unlabored. Heart:  RRR with S1 S2. No murmurs, rubs, or gallops appreciated. Abdomen: Soft, non-tender, non-distended with normoactive bowel sounds. No rebound/guarding. No obvious abdominal masses. Lower extremities: Ulcerated area R lateral malleolus with ulcers on both feet. Heel protectors in place.  Neuro: Alert and oriented X 3. Moves all extremities spontaneously. Psych:  Responds to questions appropriately with a normal affect. Dialysis Access: RUA AVF + bruit  HD orders: [place] MWF 4 hrs 180 NRe 400/800 78 kg 2.0K/2.25 Ca Linear Sodium UF profile 2 -No Heparin  -Hectorol 1 mcg IV TIW -Mircera 225 mcg IV Q 2 weeks (last dose 10/29/17 Last Hgb 9.6)  Assessment/Plan: 1.  Myasthenia Gravis-Neurology consulted. On prednisone. Per primary 2.  Cellulitis of R Lateral Malleous with signs of Osteomyelitis: Seen by [facility], recommending bilateral amputations. On Vanc which is problematic to myasthenia gravis. Per primary/VVS.  3.  ESRD - MWF HD today D/T missed tx 11/05/17 and again tomorrow to get back on schedule. K+3.6  Use 4.0 K bath. No Heparin.  4.  Hypertension/volume  -No  antihypertensive med on OP med list. Appears to be volume controlled. UF to OP EDW.  5.  Anemia  - HGB 8.7 ESA dose given last week. Follow HGB 6.  Metabolic bone disease - Continue binders, VDRA. Ca 9.1 7.  Nutrition - Albumin 2.4  Renal/Carb mod diet, prostat, renal vits. 8.  DM: per primary 9.  Afib: on coumadin monitored by pharmacy 10.  H/O CVA  26.  H/O 2nd degree HB-PPM per Dr. [name].   Rita H. Owens Shark, NP-C 11/06/2017, 12:38 PM  [facility] Beeper [phone]  Pt seen, examined and agree w A/P as above. ESRD pt started HD in May 2018 then diagnosed with myasthenia gravis in July 2018 and has had progressive FTT since.  Is now bed-bound w/ bilat LE wounds which are progressive.  Prognosis is poor.  Pt and I briefly discussed option of HD withdrawal, he is not sure what to do.  Plan HD today and then again  tomorrow to get back on schedule.  Kelly Splinter MD [employer] pager [phone]   11/06/2017, 4:09 PM

## 2017-11-06 NOTE — Progress Notes (Signed)
NIF-40, Vital Capacity- 1.6L with great pt effort. HR 97, 117/96,RR-10, and 94% SpO2 on Room Air. RT to cont to monitor.

## 2017-11-06 NOTE — Procedures (Signed)
   I was present at this dialysis session, have reviewed the session itself and made  appropriate changes Kelly Splinter MD [place] pager [phone]   11/06/2017, 4:14 PM

## 2017-11-06 NOTE — Progress Notes (Signed)
Pt performed NIF at > -40 and VC at 1.7L with good technique.

## 2017-11-06 NOTE — Progress Notes (Signed)
Patient ID: James David, male   DOB: 05-03-1936, 82 y.o.   MRN: [id number] Alert and oriented.  Reports pain in both lower extremities from his knees into his feet.  Again explained only option would be bilateral amputation.  Have tentatively planned this for Monday, 11/10/2017  Will discuss further with patient and wife tomorrow.

## 2017-11-06 NOTE — Progress Notes (Signed)
Palliative Medicine consult noted. Due to high referral volume, there may be a delay seeing this patient. Please call the Palliative Medicine Team office at [phone] if recommendations are needed in the interim.  Thank you for inviting [place] to see this patient.  Marjie Skiff Vantasia Pinkney, RN, BSN, [facility] Palliative Medicine Team 11/06/2017 3:17 PM Office [phone]

## 2017-11-06 NOTE — Progress Notes (Addendum)
PROGRESS NOTE  James David XLK:[id number] DOB: September 02, 1936 DOA: 11/04/2017 PCP: James Cruel, MD   LOS: 2 days   Brief Narrative / Interim history: James David is a 82 y.o male with a medical history significant for myasthenia gravis, DM, ESRD on HD M-W-F, Atrial fibrillation on Coumadin, anemia, chronic lower extremity wounds due to PVD, and history of prior stroke in May who presented to the ED on 11/04/2017 due to worsening generalized weakness over the past week. Patient was recently diagnosed with myasthenia gravis in June 2018 and is currently following up with an outpatient neurologist. He has been wheelchair bound since his diagnosis of MG. His EMG in January demonstrated dependent sensory polyneuropathy with no indications of ALS. Upon arrival to the ED, he was found to have multiple ulcerations on both his right and left feet, with the right lateral malleolus ulcer displaying signs of infection such as purulent discharge and erythema surrounding the lesion. Right ankle x-ray demonstrated questionable small cortical erosion/focus of bone infection at the lateral fibular malleolar region and thinning of the soft tissues and lucency overlying the fibular malleolus.  Patient was admitted on the working diagnosis of cellulitis of the right foot with possible osteomyelitis and increasing generalized weakness and questionable myasthenia gravis exacerbation. Neurology was consulted and recommended continue prednisone and evaluate for spinal cord pathology. Vascular surgery was consulted and recommended bilateral above the knee amputation. PT evaluated the patient and recommended discharge to SNF unless family able to get mechanical life for patient.    Assessment & Plan: Principal Problem:   Weakness Active Problems:   Essential hypertension   Diabetes mellitus due to underlying condition with diabetic nephropathy (HCC)   Pacemaker   ESRD (end stage renal disease) on dialysis [facility])  Atrial fibrillation, permanent (HCC)   Myasthenia gravis (HCC)   Benign essential HTN   Cellulitis   Cellulitis of right lower extremity   Generalized weakness   1. Cellulitis of Right Lateral Malleolus with possible signs of osteomyelitis: Vascular surgery recommends bilateral above the knee amputation. Patient reports he does not want surgery performed. -Cefepime was changed to 1g IV q24h starting at 2000 last night, 11/05/17 -Vancomycin was held because patient did not received HD last night. Will continue vancomycin Friday 2/22 with HD per pharmacy -Continue wound appropriate wound care -Palliative care consulted for comfort care and goals for quality of life   2. Myasthenia Gravis: Neurology is unsure if myasthenia gravis exacerbation or not. Believes it could be related to myelopathy -CT myelogram ordered, but will wait until INR in safe level. INR still at 3.56. -Continue home prednisone  3. End Stage Renal Disease: Patient did not undergo HD yesterday. Last HD was Monday 2/18. -Order for HD has been put in. HD today -Patient will continue vancomyocin with HD per pharmacy -Order CMP post-HD to check trends of renal function to see if back to patient's baseline -Continue renal diet/carb modifications  4. Hypertension: BP well controlled, continue current management.   5. Atrial Fibrillation: Coumadin has been held due to INR. Current INR is 3.56. Will continue Coumadin once in favorable range.  6. Diabetes Mellitus Type 2: Patient's last glucose reading was 170. Patient is on prednisone which will cause elevation of blood glucose. Will continue to monitor. Continue patient on current insulin regimen.  DVT prophylaxis: Coumadin (held due to INR) Code Status: Full Code Family Communication: Patient's wife present at bedside Disposition Plan: PT recommended SNF unless mechanical life purchased for home care  Consultants:  Neurology Vascular Surgery Physical  Therapy Respiratory Therapy Palliative Care  Procedures:  CT myelogram (pending safe INR)  Antimicrobials: Cefepime 1g IV q24h  Vancomycin will be added pending HD  Subjective: Patient seen and examined, he reported feeling better today with mild general weakness. He admits to moderate pain in both legs that is relieved with pain medication. Patient denies shortness of breath, chest pain, diplopia.   Objective: Vitals:   11/05/17 1702 11/05/17 2113 11/05/17 2250 11/06/17 0406  BP:   (!) 140/57   Pulse:   81   Resp:   (!) 22   Temp: 98.2 F (36.8 C) 98.3 F (36.8 C)  (!) 97.5 F (36.4 C)  TempSrc:  Oral  Oral  SpO2:   94%   Weight:    76.8 kg (169 lb 5 oz)  Height:        Intake/Output Summary (Last 24 hours) at 11/06/2017 1118 Last data filed at 11/06/2017 0945 Gross per 24 hour  Intake 1140 ml  Output 1 ml  Net 1139 ml   Filed Weights   11/04/17 1333 11/06/17 0406  Weight: 79.4 kg (175 lb) 76.8 kg (169 lb 5 oz)    Examination:  Constitutional: NAD Eyes: PERRL Respiratory: clear to auscultation bilaterally, no wheezing, no crackles. Normal respiratory effort. No accessory muscle use.  Cardiovascular: Regular rate and rhythm, normal S1-S2, no murmurs / rubs / gallops. No LE edema. 2+ pedal pulses. No carotid bruits.  Abdomen: soft, nontender and nondistended Skin: See pictures from previous progress note 2/20 Neurologic: CN 2-12 grossly intact. Upper extremity 5/5 bilateral. Sensation intake throughout Psychiatric: Alert and oriented x 3.    Data Reviewed: I have independently reviewed following labs and imaging studies   CBC: Recent Labs  Lab 11/04/17 1500 11/05/17 0637 11/06/17 0312  WBC 13.3* 11.8* 10.8*  NEUTROABS 11.6*  --  8.9*  HGB 10.4* 9.2* 8.7*  HCT 33.6* 30.1* 28.0*  MCV 104.0* 104.9* 102.6*  PLT 402* 357 509   Basic Metabolic Panel: Recent Labs  Lab 11/04/17 1500 11/05/17 0637 11/06/17 0312  NA 132* 133* 129*  K 3.8 3.6 3.6  CL 89*  93* 91*  CO2 26 22 24   GLUCOSE 353* 191* 170*  BUN 54* 62* 74*  CREATININE 5.57* 6.31* 6.98*  CALCIUM 9.4 9.1 9.1  MG 2.3  --   --    GFR: Estimated Creatinine Clearance: 9.3 mL/min (A) (by C-G formula based on SCr of 6.98 mg/dL (H)). Liver Function Tests: Recent Labs  Lab 11/04/17 1500  AST 22  ALT 23  ALKPHOS 49  BILITOT 0.7  PROT 6.7  ALBUMIN 2.4*   No results for input(s): LIPASE, AMYLASE in the last 168 hours. No results for input(s): AMMONIA in the last 168 hours. Coagulation Profile: Recent Labs  Lab 11/04/17 1500 11/05/17 0637 11/06/17 0312  INR 3.00 3.42 3.56   Cardiac Enzymes: No results for input(s): CKTOTAL, CKMB, CKMBINDEX, TROPONINI in the last 168 hours. BNP (last 3 results) No results for input(s): PROBNP in the last 8760 hours. HbA1C: No results for input(s): HGBA1C in the last 72 hours. CBG: Recent Labs  Lab 11/05/17 0816 11/05/17 1225 11/05/17 1703 11/05/17 2152 11/06/17 0756  GLUCAP 194* 213* 254* 285* 130*   Lipid Profile: No results for input(s): CHOL, HDL, LDLCALC, TRIG, CHOLHDL, LDLDIRECT in the last 72 hours. Thyroid Function Tests: Recent Labs    11/04/17 1703  TSH 0.484   Anemia Panel: No results for input(s): VITAMINB12, FOLATE,  FERRITIN, TIBC, IRON, RETICCTPCT in the last 72 hours. Urine analysis:    Component Value Date/Time   COLORURINE AMBER (A) 11/05/2017 1902   APPEARANCEUR TURBID (A) 11/05/2017 1902   LABSPEC 1.013 11/05/2017 1902   PHURINE 7.0 11/05/2017 1902   GLUCOSEU >=500 (A) 11/05/2017 1902   HGBUR SMALL (A) 11/05/2017 1902   BILIRUBINUR NEGATIVE 11/05/2017 1902   KETONESUR NEGATIVE 11/05/2017 1902   PROTEINUR 100 (A) 11/05/2017 1902   NITRITE NEGATIVE 11/05/2017 1902   LEUKOCYTESUR MODERATE (A) 11/05/2017 1902   Sepsis Labs: Invalid input(s): PROCALCITONIN, LACTICIDVEN  No results found for this or any previous visit (from the past 240 hour(s)).    Radiology Studies: Dg Chest 2 View  Result  Date: 11/04/2017 CLINICAL DATA:  Fatigue, weakness, history of myasthenia gravis EXAM: CHEST  2 VIEW COMPARISON:  07/27/2017 FINDINGS: Surgical clips over the left chest. Left-sided pacing device, similar compared to previous exam. No significant pleural effusion. No acute consolidation. Stable enlarged cardiomediastinal silhouette with aortic atherosclerosis. No pneumothorax. Vascular stent in the right axillary region. Flowing osteophytes of the spine. IMPRESSION: No active cardiopulmonary disease.  Cardiomegaly. Electronically Signed   By: Donavan Foil M.D.   On: 11/04/2017 18:13   Dg Ankle 2 Views Left  Result Date: 11/04/2017 CLINICAL DATA:  Ankle pain EXAM: LEFT ANKLE - 2 VIEW COMPARISON:  None. FINDINGS: Only a single lateral view of the ankle is submitted. According to the notes, patient unable to hold position for AP view. Large plantar calcaneal spur and enthesophytes. Vascular calcification. No obvious acute abnormality IMPRESSION: 1. Limited single lateral view shows no acute abnormality 2. Large plantar calcaneal spur. Electronically Signed   By: Donavan Foil M.D.   On: 11/04/2017 18:42   Dg Ankle Complete Right  Result Date: 11/04/2017 CLINICAL DATA:  Worsening foot wounds EXAM: RIGHT ANKLE - COMPLETE 3+ VIEW COMPARISON:  06/28/2016 FINDINGS: No acute displaced fracture or malalignment is seen. Possible small erosion at the fibular malleolar tip on one view only. There is lucency and thinning of the soft tissues over the fibular malleolus suspected to represent an ulcer. Mild degenerative changes of the medial ankle joint. Vascular calcifications. Large calcaneal spur and enthesophyte. Soft tissue thickening posterior calcaneal region. No soft tissue gas IMPRESSION: 1. Questionable small cortical erosion/focus of bone infection at the lateral fibular malleolar region on one view; there is thinning of the soft tissues and a lucency overlying the fibular malleolus, presumably representing an  ulcer or wound. 2. Large plantar calcaneal spurs. Electronically Signed   By: Donavan Foil M.D.   On: 11/04/2017 18:19   Dg Foot Complete Left  Result Date: 11/04/2017 CLINICAL DATA:  Bilateral foot wounds right and left heel EXAM: LEFT FOOT - COMPLETE 3+ VIEW COMPARISON:  None. FINDINGS: No acute fracture or malalignment. Moderate arthritis at the first MTP joint. Screw fixation of the head of fifth metatarsal with chronic deformity noted. Vascular calcifications. Large calcaneal spurs and enthesophyte. No soft tissue gas. No radiopaque foreign body IMPRESSION: 1. No acute osseous abnormality 2. Postsurgical changes of the distal fifth metatarsal with evidence of old fracture deformity 3. Large calcaneal spurs Electronically Signed   By: Donavan Foil M.D.   On: 11/04/2017 18:15   Dg Foot Complete Right  Result Date: 11/04/2017 CLINICAL DATA:  Foot wounds EXAM: RIGHT FOOT COMPLETE - 3+ VIEW COMPARISON:  None. FINDINGS: No acute displaced fracture or malalignment is seen. Extensive vascular calcifications. Possible old fracture deformity involving the proximal shaft and base of  the third metatarsal. Large plantar calcaneal spur. No soft tissue gas. IMPRESSION: 1. No acute osseous abnormality 2. Large plantar calcaneal spur Electronically Signed   By: Donavan Foil M.D.   On: 11/04/2017 18:23     Scheduled Meds: . aspirin  81 mg Oral Daily  . calcium acetate  1,334 mg Oral TID WC  . carvedilol  25 mg Oral BID  . collagenase   Topical Daily  . [START ON 11/07/2017] doxercalciferol  1 mcg Intravenous Q M,W,F-HD  . ezetimibe  10 mg Oral Daily  . feeding supplement (NEPRO CARB STEADY)  237 mL Oral Q M,W,F  . fenofibrate  160 mg Oral Daily  . finasteride  5 mg Oral Daily  . folic acid-pyridoxine-cyancobalamin  1 tablet Oral QHS  . gabapentin  100 mg Oral Daily   And  . gabapentin  200 mg Oral QHS  . insulin aspart  0-9 Units Subcutaneous TID WC  . insulin glargine  5 Units Subcutaneous QHS  .  lanthanum  1,000 mg Oral TID WC  . Melatonin  3 mg Oral QHS  . predniSONE  20 mg Oral Q breakfast   Continuous Infusions: . ceFEPime (MAXIPIME) IV Stopped (11/05/17 2139)  . vancomycin         Time spent: 25 minutes    Lanna Poche, PA-S 11/06/2017, 11:18 AM

## 2017-11-07 DIAGNOSIS — Z515 Encounter for palliative care: Secondary | ICD-10-CM

## 2017-11-07 DIAGNOSIS — Z7189 Other specified counseling: Secondary | ICD-10-CM

## 2017-11-07 LAB — MRSA PCR SCREENING: MRSA by PCR: NEGATIVE

## 2017-11-07 LAB — PROTIME-INR
INR: 2.66
PROTHROMBIN TIME: 28.1 s — AB (ref 11.4–15.2)

## 2017-11-07 LAB — RENAL FUNCTION PANEL
ALBUMIN: 2 g/dL — AB (ref 3.5–5.0)
ANION GAP: 14 (ref 5–15)
BUN: 36 mg/dL — ABNORMAL HIGH (ref 6–20)
CALCIUM: 9 mg/dL (ref 8.9–10.3)
CO2: 24 mmol/L (ref 22–32)
Chloride: 98 mmol/L — ABNORMAL LOW (ref 101–111)
Creatinine, Ser: 4.69 mg/dL — ABNORMAL HIGH (ref 0.61–1.24)
GFR calc Af Amer: 12 mL/min — ABNORMAL LOW (ref >60)
GFR, EST NON AFRICAN AMERICAN: 11 mL/min — AB (ref >60)
Glucose, Bld: 180 mg/dL — ABNORMAL HIGH (ref 65–99)
PHOSPHORUS: 2.3 mg/dL — AB (ref 2.5–4.6)
Potassium: 3.7 mmol/L (ref 3.5–5.1)
SODIUM: 136 mmol/L (ref 135–145)

## 2017-11-07 LAB — GLUCOSE, CAPILLARY
GLUCOSE-CAPILLARY: 175 mg/dL — AB (ref 65–99)
GLUCOSE-CAPILLARY: 265 mg/dL — AB (ref 65–99)
Glucose-Capillary: 247 mg/dL — ABNORMAL HIGH (ref 65–99)

## 2017-11-07 LAB — CBC WITH DIFFERENTIAL/PLATELET
BASOS ABS: 0 10*3/uL (ref 0.0–0.1)
BASOS PCT: 0 %
Eosinophils Absolute: 0 10*3/uL (ref 0.0–0.7)
Eosinophils Relative: 0 %
HEMATOCRIT: 29.8 % — AB (ref 39.0–52.0)
HEMOGLOBIN: 8.8 g/dL — AB (ref 13.0–17.0)
LYMPHS PCT: 11 %
Lymphs Abs: 1.2 10*3/uL (ref 0.7–4.0)
MCH: 30.7 pg (ref 26.0–34.0)
MCHC: 29.5 g/dL — ABNORMAL LOW (ref 30.0–36.0)
MCV: 103.8 fL — AB (ref 78.0–100.0)
MONO ABS: 0.8 10*3/uL (ref 0.1–1.0)
Monocytes Relative: 7 %
NEUTROS PCT: 82 %
Neutro Abs: 8.4 10*3/uL — ABNORMAL HIGH (ref 1.7–7.7)
Platelets: 355 10*3/uL (ref 150–400)
RBC: 2.87 MIL/uL — AB (ref 4.22–5.81)
RDW: 17.6 % — AB (ref 11.5–15.5)
WBC: 10.4 10*3/uL (ref 4.0–10.5)

## 2017-11-07 MED ORDER — DOXERCALCIFEROL 4 MCG/2ML IV SOLN
INTRAVENOUS | Status: AC
  Filled 2017-11-07: qty 2

## 2017-11-07 MED ORDER — VANCOMYCIN HCL IN DEXTROSE 750-5 MG/150ML-% IV SOLN
INTRAVENOUS | Status: AC
  Filled 2017-11-07: qty 150

## 2017-11-07 NOTE — Progress Notes (Signed)
PT Cancellation Note  Patient Details Name: James David MRN: [id number] DOB: 07-04-1936   Cancelled Treatment:    Reason Eval/Treat Not Completed: Patient at procedure or test/unavailable. Room empty on arrival. Rn advised pt was in HD. Will check back later in the afternoon if time allows.   Benjiman Core, PTA Pager [phone] Acute Rehab   Allena Katz 11/07/2017, 2:09 PM

## 2017-11-07 NOTE — Progress Notes (Signed)
Patient tolerated 4 hour HD trmt fairly well.  Patient had increased restlessness and lack of time awareness last 1.5 hours of trmt.  He thought it was 10 am and he should be fininshed with trmt.  Re-orientation provided with little success, emotional support provided.  Report given to Earnie Larsson, RN post HD including decreased orientation.

## 2017-11-07 NOTE — Progress Notes (Addendum)
PROGRESS NOTE  James David SEL:[id number] DOB: 03-01-1936 DOA: 11/04/2017 PCP: [name], MD  HPI/Recap of past 24 hours: Patient is a 69 male past medical history of myasthenia gravis (wheelchair-bound), diabetes mellitus type 2, end-stage renal disease on hemodialysis, atrial fibrillation on chronic Coumadin and peripheral vascular disease with chronic lower extremity wounds who presented to the emergency room with cellulitis of the right foot and possible osteomyelitis plus questionable myasthenia gravis exacerbation. Neurology consulted who felt like patient increased weakness was not from his myasthenia and recommended continuing his home dose of prednisone and evaluating for spinal cord pathology. Asked her surgery evaluated patient's leg wounds and recommended bilateral above-the-knee amputation. Patient was resistant to having this done in the care of this was not felt to get better medically, palliative care was consulted. Palliative care met with patient and his wife on 2/22.  For now, patient wants to consider all the issues, but would like to proceed with hemodialysis and not proceed with bilateral AKA, understanding issued that are involved. Plans will be to titrate his medication for symptom management.palliative care will follow-up on 2/23.  Patient currently complains of lower extremity pain, felt very fatigued.  Assessment/Plan: Active Problems:   Essential hypertension: Blood pressure stable   Diabetes mellitus due to underlying condition with diabetic nephropathy ([place]): CBGs at 180 or less   Pacemaker   ESRD (end stage renal disease) on dialysis [facility]): Being followed by nephrology, getting hemodialysis 3 times a week   Atrial fibrillation, permanent ([place])   Myasthenia gravis ([place]) with increased weakness: Unclear if myasthenia gravis is called for exacerbation. Patient would benefit from a CT myelogram, however useful myelopathy was noted, he would not be a good  surgical candidate given his myasthenia, severe peripheral vascular disease and end-stage renal disease. Therefore, we'll hold off. Continue prednisone   Benign essential HTN      Cellulitis of right lower extremity, specifically right lateral malleolus with signs of osteomyelitis: Recommendation for below-the-knee amputation. Patient does not want this. Continue cefepime and vancomycin.   Generalized weakness   Palliative care by specialist   Code Status: Full code   Family Communication: Wife at the bedside   Disposition Plan: Depending on palliative care goals of care    Consultants:  Palliative care  Neurology  Vascular surgery   Procedures:  None   Antimicrobials:  IV Ancef and vancomycin 2/21-present   DVT prophylaxis:  Coumadin   Objective: Vitals:   11/07/17 1200 11/07/17 1230 11/07/17 1310 11/07/17 1334  BP: (!) 116/55 (!) 131/53 (!) 127/59 133/63  Pulse: 99 93 92   Resp: 18 18 18 10   Temp:   (!) 97 F (36.1 C) 98.2 F (36.8 C)  TempSrc:   Oral Oral  SpO2:   99%   Weight:      Height:        Intake/Output Summary (Last 24 hours) at 11/07/2017 1632 Last data filed at 11/07/2017 1310 Gross per 24 hour  Intake -  Output 3320 ml  Net -3320 ml   Filed Weights   11/06/17 1700 11/07/17 0300 11/07/17 0815  Weight: 78 kg (171 lb 15.3 oz) 77.6 kg (171 lb 1.2 oz) 77.9 kg (171 lb 11.8 oz)   Body mass index is 23.29 kg/m.  Exam:   General:  Alert and oriented 3, no acute distress  HEENT: Normocephalic atraumatic mucous membranes slightly dry  Neck: Supple, no JVD   Cardiovascular: Irregular rhythm, rate controlled   Respiratory: Clear to  auscultation bilaterally   Abdomen: Soft, nontender, nondistended, positive bowel sounds   Musculoskeletal/skin: 2+ edema in the lower extremities with multiple stage IV wounds, the worst being on the right lateral malleolus and black eschar on the lateral aspect of foot   Psychiatry: Appropriate, no  evidence of psychoses   Data Reviewed: CBC: Recent Labs  Lab 11/04/17 1500 11/05/17 0637 11/06/17 0312 11/06/17 1812 11/07/17 0744  WBC 13.3* 11.8* 10.8* 11.3* 10.4  NEUTROABS 11.6*  --  8.9*  --  8.4*  HGB 10.4* 9.2* 8.7* 9.8* 8.8*  HCT 33.6* 30.1* 28.0* 31.8* 29.8*  MCV 104.0* 104.9* 102.6* 102.3* 103.8*  PLT 402* 357 371 372 254   Basic Metabolic Panel: Recent Labs  Lab 11/04/17 1500 11/05/17 0637 11/06/17 0312 11/06/17 1812 11/07/17 0744  NA 132* 133* 129* 133* 136  K 3.8 3.6 3.6 3.9 3.7  CL 89* 93* 91* 94* 98*  CO2 26 22 24 26 24   GLUCOSE 353* 191* 170* 139* 180*  BUN 54* 62* 74* 22* 36*  CREATININE 5.57* 6.31* 6.98* 3.25* 4.69*  CALCIUM 9.4 9.1 9.1 8.5* 9.0  MG 2.3  --   --   --   --   PHOS  --   --   --  1.7* 2.3*   GFR: Estimated Creatinine Clearance: 14 mL/min (A) (by C-G formula based on SCr of 4.69 mg/dL (H)). Liver Function Tests: Recent Labs  Lab 11/04/17 1500 11/06/17 1812 11/07/17 0744  AST 22  --   --   ALT 23  --   --   ALKPHOS 49  --   --   BILITOT 0.7  --   --   PROT 6.7  --   --   ALBUMIN 2.4* 2.2* 2.0*   No results for input(s): LIPASE, AMYLASE in the last 168 hours. No results for input(s): AMMONIA in the last 168 hours. Coagulation Profile: Recent Labs  Lab 11/04/17 1500 11/05/17 0637 11/06/17 0312 11/07/17 0744  INR 3.00 3.42 3.56 2.66   Cardiac Enzymes: No results for input(s): CKTOTAL, CKMB, CKMBINDEX, TROPONINI in the last 168 hours. BNP (last 3 results) No results for input(s): PROBNP in the last 8760 hours. HbA1C: No results for input(s): HGBA1C in the last 72 hours. CBG: Recent Labs  Lab 11/06/17 0756 11/06/17 1220 11/06/17 1816 11/06/17 2132 11/07/17 0750  GLUCAP 130* 312* 136* 237* 175*   Lipid Profile: No results for input(s): CHOL, HDL, LDLCALC, TRIG, CHOLHDL, LDLDIRECT in the last 72 hours. Thyroid Function Tests: Recent Labs    11/04/17 1703  TSH 0.484   Anemia Panel: No results for input(s):  VITAMINB12, FOLATE, FERRITIN, TIBC, IRON, RETICCTPCT in the last 72 hours. Urine analysis:    Component Value Date/Time   COLORURINE AMBER (A) 11/05/2017 1902   APPEARANCEUR TURBID (A) 11/05/2017 1902   LABSPEC 1.013 11/05/2017 1902   PHURINE 7.0 11/05/2017 1902   GLUCOSEU >=500 (A) 11/05/2017 1902   HGBUR SMALL (A) 11/05/2017 1902   BILIRUBINUR NEGATIVE 11/05/2017 1902   KETONESUR NEGATIVE 11/05/2017 1902   PROTEINUR 100 (A) 11/05/2017 1902   NITRITE NEGATIVE 11/05/2017 1902   LEUKOCYTESUR MODERATE (A) 11/05/2017 1902   Sepsis Labs: @LABRCNTIP (procalcitonin:4,lacticidven:4)  ) Recent Results (from the past 240 hour(s))  MRSA PCR Screening     Status: None   Collection Time: 11/07/17  3:44 AM  Result Value Ref Range Status   MRSA by PCR NEGATIVE NEGATIVE Final    Comment:        The GeneXpert MRSA  Assay (FDA approved for NASAL specimens only), is one component of a comprehensive MRSA colonization surveillance program. It is not intended to diagnose MRSA infection nor to guide or monitor treatment for MRSA infections. Performed at [facility], [place] [address], [place], [place] [address]       Studies: No results found.  Scheduled Meds: . aspirin  81 mg Oral Daily  . calcium acetate  1,334 mg Oral TID WC  . carvedilol  25 mg Oral BID  . collagenase   Topical Daily  . doxercalciferol      . doxercalciferol  1 mcg Intravenous Q M,W,F-HD  . ezetimibe  10 mg Oral Daily  . feeding supplement (NEPRO CARB STEADY)  237 mL Oral Q M,W,F  . fenofibrate  160 mg Oral Daily  . finasteride  5 mg Oral Daily  . folic acid-pyridoxine-cyancobalamin  1 tablet Oral QHS  . gabapentin  100 mg Oral Daily   And  . gabapentin  200 mg Oral QHS  . insulin aspart  0-9 Units Subcutaneous TID WC  . insulin glargine  5 Units Subcutaneous QHS  . lanthanum  1,000 mg Oral TID WC  . Melatonin  3 mg Oral QHS  . predniSONE  20 mg Oral Q breakfast    Continuous Infusions: . sodium  chloride    . sodium chloride    . sodium chloride    . sodium chloride    . ceFEPime (MAXIPIME) IV Stopped (11/06/17 2255)  . Vancomycin    . vancomycin Stopped (11/07/17 1248)     LOS: 3 days     Annita Brod, MD [facility]  To reach me or the doctor on call, go to: [email or web address] Password [email or web address]  11/07/2017, 4:32 PM

## 2017-11-07 NOTE — Progress Notes (Signed)
NIF -30 

## 2017-11-07 NOTE — Progress Notes (Signed)
NIF -36  Good patient effort

## 2017-11-07 NOTE — Progress Notes (Signed)
Patient is active with [facility] as prior to admission; James David [phone]

## 2017-11-07 NOTE — Progress Notes (Signed)
[facility] Progress Note  Subjective: had nightmares last night  Vitals:   11/07/17 0930 11/07/17 1000 11/07/17 1030 11/07/17 1100  BP: 138/66 (!) 134/41 (!) 119/59 (!) 111/50  Pulse: 92 94 94 100  Resp: 18 16 18 18   Temp:      TempSrc:      SpO2:      Weight:      Height:        Inpatient medications: . aspirin  81 mg Oral Daily  . calcium acetate  1,334 mg Oral TID WC  . carvedilol  25 mg Oral BID  . collagenase   Topical Daily  . doxercalciferol  1 mcg Intravenous Q M,W,F-HD  . ezetimibe  10 mg Oral Daily  . feeding supplement (NEPRO CARB STEADY)  237 mL Oral Q M,W,F  . fenofibrate  160 mg Oral Daily  . finasteride  5 mg Oral Daily  . folic acid-pyridoxine-cyancobalamin  1 tablet Oral QHS  . gabapentin  100 mg Oral Daily   And  . gabapentin  200 mg Oral QHS  . insulin aspart  0-9 Units Subcutaneous TID WC  . insulin glargine  5 Units Subcutaneous QHS  . lanthanum  1,000 mg Oral TID WC  . Melatonin  3 mg Oral QHS  . predniSONE  20 mg Oral Q breakfast   . sodium chloride    . sodium chloride    . sodium chloride    . sodium chloride    . ceFEPime (MAXIPIME) IV Stopped (11/06/17 2255)  . vancomycin     sodium chloride, sodium chloride, sodium chloride, sodium chloride, acetaminophen **OR** acetaminophen, hydrALAZINE, lidocaine (PF), lidocaine-prilocaine, lidocaine-prilocaine, ondansetron **OR** ondansetron (ZOFRAN) IV, pentafluoroprop-tetrafluoroeth, pentafluoroprop-tetrafluoroeth  Exam: Elderly pleasant WM No jvd Chest clear bilat RRR no mrg Abd soft ntnd no ascites Ext bilat multiple foot ulcers, R lat malleolus ulcer No leg edema RUA AVF NF Ox 3  Dialysis: [facility] MWF 4h   78kg   2/2.25  P2   Hep none   RUA AVF -Hectorol 1 mcg IV TIW -Mircera 225 mcg IV Q 2 weeks (last dose 10/29/17 Last Hgb 9.6)   Impression: 1  Bilat foot ulcers/ R lat malleolus osteo - on IV abx, not sure if he will do surg or not 2  Myasthenia gravis,  progressive debility 3  ESRD HD MWF 4  Afib coumadin on hold 5  Vol no excess, at dry wt 6  Anemia ckd - cont esa as needed, next dose due 2/27  Plan - HD today, min UF   Kelly Splinter MD [facility] pager [phone]   11/07/2017, 11:24 AM   Recent Labs  Lab 11/06/17 0312 11/06/17 1812 11/07/17 0744  NA 129* 133* 136  K 3.6 3.9 3.7  CL 91* 94* 98*  CO2 24 26 24   GLUCOSE 170* 139* 180*  BUN 74* 22* 36*  CREATININE 6.98* 3.25* 4.69*  CALCIUM 9.1 8.5* 9.0  PHOS  --  1.7* 2.3*   Recent Labs  Lab 11/04/17 1500 11/06/17 1812 11/07/17 0744  AST 22  --   --   ALT 23  --   --   ALKPHOS 49  --   --   BILITOT 0.7  --   --   PROT 6.7  --   --   ALBUMIN 2.4* 2.2* 2.0*   Recent Labs  Lab 11/04/17 1500  11/06/17 0312 11/06/17 1812 11/07/17 0744  WBC 13.3*   < > 10.8* 11.3* 10.4  NEUTROABS 11.6*  --  8.9*  --  8.4*  HGB 10.4*   < > 8.7* 9.8* 8.8*  HCT 33.6*   < > 28.0* 31.8* 29.8*  MCV 104.0*   < > 102.6* 102.3* 103.8*  PLT 402*   < > 371 372 355   < > = values in this interval not displayed.   Iron/TIBC/Ferritin/ %Sat    Component Value Date/Time   IRON 26 (L) 03/18/2017 1017   TIBC 211 (L) 03/18/2017 1017   FERRITIN 457 (H) 03/18/2017 1017   IRONPCTSAT 12 (L) 03/18/2017 1017

## 2017-11-07 NOTE — Progress Notes (Signed)
Patient ID: James David, male   DOB: 31-Jan-1936, 82 y.o.   MRN: [id number] Alert and oriented.  Reports bilateral lower extremity pain but reports that this is manageable with pain medication.  He is not interested in proceeding with bilateral above-knee amputations.  Understands that he will not be able to heal his ulcerations.  I certainly agree with his decision supportive care.  We will follow from the side lines.  The patient's wife was not present when I rounded this afternoon.  Will cancel surgery for Monday

## 2017-11-07 NOTE — Plan of Care (Signed)
Reviewed Hemodialysis treatment and orders with patient.  Verbalized understanding.

## 2017-11-07 NOTE — Consult Note (Signed)
                                                                                 Consultation Note Date: 11/07/2017   Patient Name: James David  DOB: 11/19/1935  MRN: [id number]  Age / Sex: 81 y.o., male  PCP: David, James Alan, MD Referring Physician: Krishnan, James K, MD  Reason for Consultation: Establishing goals of care and Psychosocial/spiritual support  HPI/Patient Profile: 81 y.o. male  with past medical history of myasthenia gravis (diagnosed in May 2018) diabetes, end-stage renal disease on hemodialysis (started in May 2018) atrial fib, anemia, chronic lower extremity wounds, history of CVA (May 2018) admitted on 11/04/2017 with weakness.  Patient was admitted secondary to lower extremity, bilateral wounds, cellulitis.  Patient has been seen by vascular services and are recommending a bilateral AKA, tentatively planned for 11/10/2017  Consult ordered for goals of care.   Clinical Assessment and Goals of Care: Met with patient, patient's wife James David, as well as chart reviewed.  Mrs. Gessel shares that since May 2018 things have really "gone downhill fast".  She  describes that on May 1  he went into renal failure and started hemodialysis, then was diagnosed with myasthenia gravis which required intubation, as well as having a stroke in May.  She reports patient was functional, independent in April 2018 and is now wheelchair-bound.  Wife has and her  a MOST form that was completed in December 2018 which directed full scope of treatment including CPR, defibrillation as well as intubation  Patient at this point is capable of making his own decisions.  In the event that he were unable to, his healthcare proxy would be his wife, James David at [phone]  She shares with me a conversation that she and her husband have had that since meeting with vascular service that patient states he is not wanting to go forward with bilateral above-the-knee amputation.  When I met with patient in  hemodialysis, he is shares with me "I do not think I can handle that".  Wife shares that patient was very independent, I volunteer fireman.  Since he has been unable to walk, his quality of life has markedly declined.  He shares with me that he "hates dialysis".  Wife also states that he has had to go to a skilled nursing facility before and this additionally would be a quality of life indicator if he knew he might have to reside there if he has further medical interventions such as amputation.  Wife is a solo caregiver and is struggling in the home    SUMMARY OF RECOMMENDATIONS   Continue with full scope of treatment for now, including full code Plan to meet with patient on 11/08/2017 at 10:30 AM to further discuss goals of care specifically CODE STATUS, vascular recommendations of bilateral AKA, SNF.  Wife feels that he will be more candid  speaking to me individually but would like for me to update her with his permission after our meeting Patient unfortunately would not meet hospice criteria as long as he continues with dialysis even with his declining vascular status Code Status/Advance Care Planning:  Full code   Symptom Management:     Pain: See the patient is only on gabapentin 200 mg at bedtime.  Should opioid be indicated for lower extremity pain, would recommend oxycodone 5-10 mg every 4 hours as needed; for injectable option, Dilaudid 0.25 mg every 4 hours as needed  Palliative Prophylaxis:   Aspiration, Bowel Regimen, Delirium Protocol, Eye Care, Frequent Pain Assessment, Oral Care and Turn Reposition  Additional Recommendations (Limitations, Scope, Preferences):  Full Scope Treatment  Psycho-social/Spiritual:   Desire for further Chaplaincy support:no  Additional Recommendations: Referral to Community Resources   Prognosis:   Unable to determine  Discharge Planning: To Be Determined      Primary Diagnoses: Present on Admission: . Cellulitis . Pacemaker .  Myasthenia gravis (HCC) . Essential hypertension . Diabetes mellitus due to underlying condition with diabetic nephropathy (HCC) . Atrial fibrillation, permanent (HCC) . Benign essential HTN . Cellulitis of right lower extremity   I have reviewed the medical record, interviewed the patient and family, and examined the patient. The following aspects are pertinent.  Past Medical History:  Diagnosis Date  . Anemia 07/28/2017  . Atrial fibrillation (HCC)    on Coumadin  . CHF (congestive heart failure) (HCC)   . DDD (degenerative disc disease)   . Diabetes mellitus without complication (HCC)   . ESRD (end stage renal disease) on dialysis (HCC)   . Hyperlipidemia   . Hypertension   . Melanoma (HCC) 2010   removed at [facility]  . Second degree heart block    a. s/p STJ dual chamber PPM - Dr Allred  . Stroke (HCC) 01/2017   Social History   Socioeconomic History  . Marital status: Married    Spouse name: James David  . Number of children: None  . Years of education: None  . Highest education level: None  Social Needs  . Financial resource strain: None  . Food insecurity - worry: None  . Food insecurity - inability: None  . Transportation needs - medical: None  . Transportation needs - non-medical: None  Occupational History    Comment: retired  Tobacco Use  . Smoking status: Never Smoker  . Smokeless tobacco: Never Used  Substance and Sexual Activity  . Alcohol use: No  . Drug use: No  . Sexual activity: No  Other Topics Concern  . None  Social History Narrative   Lives in [place] with spouse.  Retired maintenance for [employer].   Family History  Problem Relation Age of Onset  . Pulmonary embolism Mother   . Diabetes Mother   . Heart attack Father   . Diabetes Brother    Scheduled Meds: . aspirin  81 mg Oral Daily  . calcium acetate  1,334 mg Oral TID WC  . carvedilol  25 mg Oral BID  . collagenase   Topical Daily  . doxercalciferol      . doxercalciferol  1  mcg Intravenous Q M,W,F-HD  . ezetimibe  10 mg Oral Daily  . feeding supplement (NEPRO CARB STEADY)  237 mL Oral Q M,W,F  . fenofibrate  160 mg Oral Daily  . finasteride  5 mg Oral Daily  . folic acid-pyridoxine-cyancobalamin  1 tablet Oral QHS  . gabapentin  100 mg Oral Daily   And  . gabapentin  200 mg Oral QHS  . insulin aspart  0-9 Units Subcutaneous TID WC  . insulin glargine  5 Units Subcutaneous QHS  . lanthanum  1,000 mg Oral TID WC  . Melatonin  3 mg Oral QHS  . predniSONE  20   mg Oral Q breakfast   Continuous Infusions: . sodium chloride    . sodium chloride    . sodium chloride    . sodium chloride    . ceFEPime (MAXIPIME) IV Stopped (11/06/17 2255)  . Vancomycin    . vancomycin 750 mg (11/07/17 1148)   PRN Meds:.sodium chloride, sodium chloride, sodium chloride, sodium chloride, acetaminophen **OR** acetaminophen, hydrALAZINE, lidocaine (PF), lidocaine-prilocaine, lidocaine-prilocaine, ondansetron **OR** ondansetron (ZOFRAN) IV, pentafluoroprop-tetrafluoroeth, pentafluoroprop-tetrafluoroeth Medications Prior to Admission:  Prior to Admission medications   Medication Sig Start Date End Date Taking? Authorizing Provider  acetaminophen (TYLENOL) 500 MG tablet Take 500-1,000 mg by mouth daily as needed for moderate pain or headache.    Yes [provider]  ARTIFICIAL TEAR OP Place 1 drop into both eyes daily.   Yes [provider]  aspirin EC 81 MG tablet Take 81 mg by mouth at bedtime.   Yes [provider]  calcium acetate (PHOSLO) 667 MG capsule Take 667-1,334 mg by mouth See admin instructions. Take 1334 mg by mouth three times daily with meals, take 1 capsule (667 mg) with snacks   Yes [provider]  carvedilol (COREG) 25 MG tablet Take 25 mg by mouth 2 (two) times daily.   Yes [provider]  ENSURE (ENSURE) Take 1 Can by mouth every other day. On non dialysis days   Yes [provider]  ezetimibe (ZETIA) 10 MG  tablet Take 1 tablet (10 mg total) by mouth daily. 02/14/17  Yes Angiulli, Lavon Paganini, PA-C  fenofibrate 160 MG tablet Take 160 mg by mouth daily.   Yes [provider]  finasteride (PROSCAR) 5 MG tablet Take 1 tablet (5 mg total) by mouth daily. 02/14/17  Yes Angiulli, Lavon Paganini, PA-C  folic acid-vitamin b complex-vitamin c-selenium-zinc (DIALYVITE) 3 MG TABS tablet Take 1 tablet at bedtime by mouth.   Yes [provider]  gabapentin (NEURONTIN) 100 MG capsule Take one tablet in the morning and two tablets at night. 10/06/17  Yes Sater, Nanine Means, MD  glimepiride (AMARYL) 1 MG tablet Take 1 tablet (1 mg total) by mouth daily with breakfast. Patient taking differently: Take 1-2 mg by mouth daily with breakfast. Take 35m once daily if blood sugar is 150 or higher 02/14/17  Yes Angiulli, DLavon Paganini PA-C  lanthanum (FOSRENOL) 1000 MG chewable tablet Chew 1,000 mg by mouth 3 (three) times daily with meals.   Yes [provider]  Melatonin 3 MG TABS Take 3 mg at bedtime by mouth.   Yes [provider]  Nutritional Supplements (FEEDING SUPPLEMENT, NEPRO CARB STEADY,) LIQD Take 237 mLs by mouth 2 (two) times daily between meals. Patient taking differently: Take 237 mLs by mouth every Monday, Wednesday, and Friday. At dialysis 03/25/17  Yes MTheodis Blaze MD  povidone-iodine (BETADINE) 10 % ointment Apply 1 application topically 3 (three) times a week.   Yes [provider]  predniSONE (DELTASONE) 20 MG tablet Take 1 tablet (20 mg total) by mouth daily with breakfast. Please note to continue taking 20 mg tablet for now  and you must be evaluated by your neurologist (Dr. SFelecia Shelling to determine next Prednisone dose and frequency. 03/26/17  Yes KBonnielee Haff MD  traMADol (ULTRAM) 50 MG tablet Take 50-100 mg by mouth 2 (two) times daily as needed for moderate pain.   Yes [provider]  warfarin (COUMADIN) 1 MG tablet 1 1/2 tab daily Patient taking differently: Take 1 mg  by mouth daily with supper.  02/14/17  Yes Angiulli, Lavon Paganini, PA-C  amoxicillin-clavulanate (AUGMENTIN) 875-125 MG tablet Take 1 tablet by mouth 2 (two) times daily. One po bid x 7 days Patient not taking: Reported on 10/21/2017 08/27/17   Charlesetta Shanks, MD  ceFEPIme 2 g in dextrose 5 % 50 mL Inject 2 g every Monday, Wednesday, and Friday at 6 PM into the vein. Patient not taking: Reported on 10/21/2017 08/01/17   Geradine Girt, DO  collagenase (SANTYL) ointment Apply daily topically. Patient not taking: Reported on 10/21/2017 08/01/17   Geradine Girt, DO  HYDROcodone-acetaminophen (NORCO/VICODIN) 5-325 MG tablet Take 1-2 tablets by mouth every 4 (four) hours as needed for moderate pain or severe pain. Patient not taking: Reported on 10/21/2017 08/27/17   Charlesetta Shanks, MD   Allergies  Allergen Reactions  . Amlodipine Besylate Swelling    Angioedema   . Lisinopril Other (See Comments)    Renal insufficiency  . Statins Other (See Comments)    Muscle aches  . Actos [Pioglitazone]     Chest pain  . Ropinirole Other (See Comments)    Causes excessive grogginess the following morning after taking  . Tape Other (See Comments)    SKIN IS THIN AND TAPE WILL ACTUALLY TEAR IT; PLEASE USE COBAN WRAP INSTEAD!!   Review of Systems  Unable to perform ROS: Other    Physical Exam  Constitutional: He is oriented to person, place, and time. He appears well-developed and well-nourished.  Frail, ill-appearing, older man seen in dialysis  HENT:  Head: Normocephalic and atraumatic.  Neck: Normal range of motion.  Cardiovascular: Normal rate.  Pulmonary/Chest: Effort normal.  Neurological: He is alert and oriented to person, place, and time.  Skin: Skin is warm and dry.  Lower extremity wounds, dressings clean dry and intact  Psychiatric: His behavior is normal. Judgment and thought content normal.  Affect constricted  Nursing note and vitals reviewed.   Vital Signs: BP (!) 111/50 (BP Location:  Left Arm)   Pulse 100   Temp (!) 97.1 F (36.2 C) (Oral)   Resp 18   Ht 6' (1.829 m)   Wt 77.9 kg (171 lb 11.8 oz)   SpO2 98%   BMI 23.29 kg/m  Pain Assessment: No/denies pain   Pain Score: Asleep   SpO2: SpO2: 98 % O2 Device:SpO2: 98 % O2 Flow Rate: .   IO: Intake/output summary:   Intake/Output Summary (Last 24 hours) at 11/07/2017 1157 Last data filed at 11/07/2017 0400 Gross per 24 hour  Intake 150 ml  Output 2000 ml  Net -1850 ml    LBM: Last BM Date: 11/04/17 Baseline Weight: Weight: 79.4 kg (175 lb) Most recent weight: Weight: 77.9 kg (171 lb 11.8 oz)     Palliative Assessment/Data:   Flowsheet Rows     Most Recent Value  Intake Tab  Referral Department  Hospitalist  Unit at Time of Referral  Med/Surg Unit  Palliative Care Primary Diagnosis  Other (Comment)  Date Notified  11/05/17  Palliative Care Type  New Palliative care  Reason for referral  Clarify Goals of Care  Date of Admission  11/04/17  Date first seen by Palliative Care  11/07/17  # of days Palliative referral response time  2 Day(s)  # of days IP prior to Palliative referral  1  Clinical Assessment  Palliative Performance Scale Score  40%  Pain Max last 24 hours  Not able to report  Pain Min Last 24 hours  Not able to report  Dyspnea Max Last 24 Hours  Not able to report  Dyspnea Min Last 24 hours  Not able to report  Nausea Max Last 24 Hours  Not able to report  Nausea Min Last 24 Hours  Not able to report  Anxiety Max Last 24 Hours  Not able to report  Anxiety Min Last 24 Hours  Not able to report  Other Max Last 24 Hours  Not able to report  Psychosocial & Spiritual Assessment  Palliative Care Outcomes  Patient/Family meeting held?  Yes  Who was at the meeting?  wife and pt  Palliative Care follow-up planned  Yes, Facility      Time In: 1045 Time Out: 1200 Time Total: 75 min Greater than 50%  of this time was spent counseling and coordinating care related to the above  assessment and plan.  Signed by:  Grace , NP   Please contact Palliative Medicine Team phone at [phone] for questions and concerns.  For individual provider: See Amion             

## 2017-11-08 DIAGNOSIS — M869 Osteomyelitis, unspecified: Secondary | ICD-10-CM

## 2017-11-08 DIAGNOSIS — E0821 Diabetes mellitus due to underlying condition with diabetic nephropathy: Secondary | ICD-10-CM

## 2017-11-08 DIAGNOSIS — M86171 Other acute osteomyelitis, right ankle and foot: Secondary | ICD-10-CM

## 2017-11-08 DIAGNOSIS — I739 Peripheral vascular disease, unspecified: Secondary | ICD-10-CM

## 2017-11-08 DIAGNOSIS — R410 Disorientation, unspecified: Secondary | ICD-10-CM

## 2017-11-08 LAB — GLUCOSE, CAPILLARY
GLUCOSE-CAPILLARY: 147 mg/dL — AB (ref 65–99)
GLUCOSE-CAPILLARY: 322 mg/dL — AB (ref 65–99)
GLUCOSE-CAPILLARY: 213 mg/dL — AB (ref 65–99)
Glucose-Capillary: 352 mg/dL — ABNORMAL HIGH (ref 65–99)

## 2017-11-08 LAB — PROTIME-INR
INR: 2.33
PROTHROMBIN TIME: 25.4 s — AB (ref 11.4–15.2)

## 2017-11-08 NOTE — Progress Notes (Signed)
Neurology Progress Note   S:// No acute problems reported.   O:// Current vital signs: BP (!) 154/56 (BP Location: Left Arm)   Pulse 90   Temp 97.7 F (36.5 C) (Oral)   Resp 14   Ht 6' (1.829 m)   Wt 77.9 kg (171 lb 11.8 oz)   SpO2 100%   BMI 23.29 kg/m  Vital signs in last 24 hours: Temp:  [97 F (36.1 C)-98.2 F (36.8 C)] 97.7 F (36.5 C) (02/23 0950) Pulse Rate:  [84-93] 90 (02/23 0950) Resp:  [10-30] 14 (02/23 0950) BP: (112-154)/(53-67) 154/56 (02/23 0950) SpO2:  [94 %-100 %] 100 % (02/23 0950) Awake alert oriented x3 Able to provide clear history No aphasia neglect Cranial nerves: Pupils equal round reactive to light, no ptosis or diplopia Extraocular movements intact Face symmetric Facial sensation intact Artery acuity intact Shoulder shrug symmetric Tongue midline Motor exam: Upper extremity 5/5 with some asterixis.  Bilateral lower extremity 4/5 with increase in tone on the right leg Sensory: Intact to light touch DTRs 2+ all over Plantars equivocal Cerebellar-no ataxia.  Asterixis+  Medications  Current Facility-Administered Medications:  .  0.9 %  sodium chloride infusion, 100 mL, Intravenous, PRN, Valentina Gu, NP .  0.9 %  sodium chloride infusion, 100 mL, Intravenous, PRN, Valentina Gu, NP .  0.9 %  sodium chloride infusion, 100 mL, Intravenous, PRN, Valentina Gu, NP .  0.9 %  sodium chloride infusion, 100 mL, Intravenous, PRN, Valentina Gu, NP .  acetaminophen (TYLENOL) tablet 650 mg, 650 mg, Oral, Q6H PRN, 650 mg at 11/07/17 1430 **OR** acetaminophen (TYLENOL) suppository 650 mg, 650 mg, Rectal, Q6H PRN, Rise Patience, MD .  aspirin chewable tablet 81 mg, 81 mg, Oral, Daily, Patrecia Pour, Christean Grief, MD, 81 mg at 11/08/17 1016 .  calcium acetate (PHOSLO) capsule 1,334 mg, 1,334 mg, Oral, TID WC, Rise Patience, MD, 1,334 mg at 11/08/17 1014 .  carvedilol (COREG) tablet 25 mg, 25 mg, Oral, BID, Rise Patience, MD, 25 mg at 11/08/17 1014 .  ceFEPIme (MAXIPIME) 1 g in sodium chloride 0.9 % 100 mL IVPB, 1 g, Intravenous, Q24H, Bajbus, Lauren D, RPH, Stopped at 11/07/17 2300 .  collagenase (SANTYL) ointment, , Topical, Daily, Patrecia Pour, Edwin, MD .  doxercalciferol (HECTOROL) injection 1 mcg, 1 mcg, Intravenous, Q M,W,F-HD, Valentina Gu, NP, 1 mcg at 11/07/17 1147 .  ezetimibe (ZETIA) tablet 10 mg, 10 mg, Oral, Daily, Rise Patience, MD, 10 mg at 11/08/17 1012 .  feeding supplement (NEPRO CARB STEADY) liquid 237 mL, 237 mL, Oral, Q M,W,F, Rise Patience, MD, 237 mL at 11/05/17 0913 .  fenofibrate tablet 160 mg, 160 mg, Oral, Daily, Rise Patience, MD, 160 mg at 11/08/17 1014 .  finasteride (PROSCAR) tablet 5 mg, 5 mg, Oral, Daily, Rise Patience, MD, 5 mg at 11/08/17 1012 .  folic acid-pyridoxine-cyancobalamin (FOLTX) 2.5-25-2 MG per tablet 1 tablet, 1 tablet, Oral, QHS, Rise Patience, MD, 1 tablet at 11/07/17 2137 .  gabapentin (NEURONTIN) capsule 100 mg, 100 mg, Oral, Daily, 100 mg at 11/08/17 1012 **AND** gabapentin (NEURONTIN) capsule 200 mg, 200 mg, Oral, QHS, Masters, Jake Church, RPH, 200 mg at 11/07/17 2136 .  hydrALAZINE (APRESOLINE) injection 10 mg, 10 mg, Intravenous, Q4H PRN, Rise Patience, MD .  insulin aspart (novoLOG) injection 0-9 Units, 0-9 Units, Subcutaneous, TID WC, Rise Patience, MD, 1 Units at 11/08/17 [phone] .  insulin glargine (LANTUS) injection 5  Units, 5 Units, Subcutaneous, QHS, Patrecia Pour, Christean Grief, MD, 5 Units at 11/07/17 2143 .  lanthanum (FOSRENOL) chewable tablet 1,000 mg, 1,000 mg, Oral, TID WC, Rise Patience, MD, 1,000 mg at 11/08/17 1016 .  lidocaine (PF) (XYLOCAINE) 1 % injection 5 mL, 5 mL, Intradermal, PRN, Valentina Gu, NP .  lidocaine-prilocaine (EMLA) cream 1 application, 1 application, Topical, PRN, Valentina Gu, NP .  lidocaine-prilocaine (EMLA) cream 1 application, 1 application,  Topical, PRN, Valentina Gu, NP .  Melatonin TABS 3 mg, 3 mg, Oral, QHS, Rise Patience, MD, 3 mg at 11/07/17 2136 .  ondansetron (ZOFRAN) tablet 4 mg, 4 mg, Oral, Q6H PRN **OR** ondansetron (ZOFRAN) injection 4 mg, 4 mg, Intravenous, Q6H PRN, Rise Patience, MD .  pentafluoroprop-tetrafluoroeth (GEBAUERS) aerosol 1 application, 1 application, Topical, PRN, Valentina Gu, NP .  pentafluoroprop-tetrafluoroeth (GEBAUERS) aerosol 1 application, 1 application, Topical, PRN, Valentina Gu, NP .  predniSONE (DELTASONE) tablet 20 mg, 20 mg, Oral, Q breakfast, Rise Patience, MD, 20 mg at 11/08/17 1012 .  vancomycin (VANCOCIN) IVPB 750 mg/150 ml premix, 750 mg, Intravenous, Q M,W,F-HD, Bajbus, Lauren D, RPH, Stopped at 11/07/17 1248 Labs CBC    Component Value Date/Time   WBC 10.4 11/07/2017 0744   RBC 2.87 (L) 11/07/2017 0744   HGB 8.8 (L) 11/07/2017 0744   HGB 12.0 (L) 10/17/2016 1303   HCT 29.8 (L) 11/07/2017 0744   HCT 34.4 (L) 10/17/2016 1303   PLT 355 11/07/2017 0744   PLT 198 10/17/2016 1303   MCV 103.8 (H) 11/07/2017 0744   MCV 86.6 10/17/2016 1303   MCH 30.7 11/07/2017 0744   MCHC 29.5 (L) 11/07/2017 0744   RDW 17.6 (H) 11/07/2017 0744   RDW 13.3 10/17/2016 1303   LYMPHSABS 1.2 11/07/2017 0744   LYMPHSABS 1.0 10/17/2016 1303   MONOABS 0.8 11/07/2017 0744   MONOABS 0.5 10/17/2016 1303   EOSABS 0.0 11/07/2017 0744   EOSABS 0.2 10/17/2016 1303   BASOSABS 0.0 11/07/2017 0744   BASOSABS 0.0 10/17/2016 1303    CMP     Component Value Date/Time   NA 136 11/07/2017 0744   NA 140 02/28/2017 0916   NA 138 10/17/2016 1303   K 3.7 11/07/2017 0744   K 5.1 10/17/2016 1303   CL 98 (L) 11/07/2017 0744   CO2 24 11/07/2017 0744   CO2 18 (L) 10/17/2016 1303   GLUCOSE 180 (H) 11/07/2017 0744   GLUCOSE 197 (H) 10/17/2016 1303   BUN 36 (H) 11/07/2017 0744   BUN 46 (H) 02/28/2017 0916   BUN 71.0 (H) 10/17/2016 1303   CREATININE 4.69 (H) 11/07/2017  0744   CREATININE 5.9 (HH) 10/17/2016 1303   CALCIUM 9.0 11/07/2017 0744   CALCIUM 8.9 10/17/2016 1303   PROT 6.7 11/04/2017 1500   PROT 6.2 09/23/2017 1513   PROT 6.2 (L) 10/17/2016 1303   ALBUMIN 2.0 (L) 11/07/2017 0744   ALBUMIN 3.2 (L) 02/28/2017 0916   ALBUMIN 2.9 (L) 10/17/2016 1303   AST 22 11/04/2017 1500   AST 8 10/17/2016 1303   ALT 23 11/04/2017 1500   ALT 13 10/17/2016 1303   ALKPHOS 49 11/04/2017 1500   ALKPHOS 95 10/17/2016 1303   BILITOT 0.7 11/04/2017 1500   BILITOT 0.2 02/28/2017 0916   BILITOT 0.24 10/17/2016 1303   GFRNONAA 11 (L) 11/07/2017 0744   GFRAA 12 (L) 11/07/2017 0744    glycosylated hemoglobin  Lipid Panel     Component Value Date/Time  CHOL 107 03/20/2017 0500   TRIG 306 (H) 03/20/2017 0500   HDL 14 (L) 03/20/2017 0500   CHOLHDL 7.6 03/20/2017 0500   VLDL 61 (H) 03/20/2017 0500   LDLCALC 32 03/20/2017 0500   Imaging I have reviewed the images with radiology- Myelogram was performed in December- no evidence of cord compression.  Assessment:  82 year old with progressive weakness in the setting of antibody positive myasthenia presenting with generalized weakness. Likely pseudo-exacerbation of his weakness due to focal infection from nonhealing ulcers. He is a vasculopath with recommendations from vascular surgery for amputations, which she is refusing. Working with palliative care to define goals of care. Given normal myelogram, suspicion for myelopathy is low.  Impression: Serial myasthenia Generalized weakness likely secondary to an active infection  Recommendations: No new neurological recommendations at this time Avoid gabapentin and narcotics Continue with prednisone Follow-up with outpatient neurologist  Please call neurology with questions.  We will be available as needed.    -- Amie Portland, MD [facility] Pager: [phone] If 7pm to 7am, please call on call as listed on AMION.

## 2017-11-08 NOTE — Progress Notes (Addendum)
PROGRESS NOTE  James David:[id number] DOB: 11-Jan-1936 DOA: 11/04/2017 PCP: [name], MD  Brief Narrative: 82 year old man PMH myasthenia gravis on prednisone, diabetes mellitus, end-stage renal disease on hemodialysis, atrial fibrillation, chronic lower extremity wounds bilaterally with no revascularization options, presented with increasing weakness.  Admitted for further evaluation of generalized weakness, suspected lower extremity wound infection.  Seen by neurology given concern for myasthenia crisis, impression was generalized weakness not secondary to myasthenia, recommendations were to continue prednisone, limit gabapentin and narcotics and avoid vancomycin.  Patient was seen by vascular surgery who recommended bilateral above-the-knee amputations. Patient was seen by nephrology for ongoing hemodialysis.  Seen by palliative care.  At this point remains full code.  Assessment/Plan Progressive weakness. Patient had cervical, lumbar and thoracic myelogram performed September 11, 2017 which showed no significant central canal stenosis to explain generalized weakness or gait instability.  This was brought to my attention by Dr. Rory Percy. We discussed case, imaging does not suggest etiology for LE weakness and no further evaluation is recommended.  Weakness is not felt to be secondary to myasthenia crisis per neurology or infection/wounds per vascular surgery. -Suspect progressive weakness multifactorial including general decline in health.  Unfortunately outside of therapy, no further recommendations.  Cellulitis right lateral malleolus in setting of peripheral vascular disease, no revascularization options.  X-ray suggested the possibility of osteomyelitis.  Vascular surgery has recommended bilateral AKA which the patient has declined at this point. - Afebrile, hemodynamic stable - Stop vanc based on neurology recs - change to oral abx 2/24  Left heel, left great toe, right anterior  foot, heel, outer foot, outer ankle wounds present on admission - continue wound care  ESRD - HD per nephrology   Diabetes mellitus type 2 - CBG somewhat labile. Continue SSI and Lantus. - avoid gabapentin and narcotics per neurology  Atrial fibrillation - can resume warfarin if no surgery   Anemia of chronic kidney disease - stable   Myasthenia gravis - continue prednisone per neurology   PMH pacemaker, stroke 01/2017   Discussed with wife in detail, she understands prognosis is poor and that treatment with antibiotics is not curative.  She does not want the patient to go to a skilled nursing facility and therefore plan will be for him to discharge back home.  She will need a lift  DVT prophylaxis: enoxaparin Code Status: full Family Communication: wife Disposition Plan: home with [facility], MDE    Murray Hodgkins, MD  [facility] Direct contact: [phone] --Via [place]  --[email or web address]; password [email or web address]  7PM-7AM contact night coverage as above 11/08/2017, 2:20 PM  LOS: 4 days   Consultants:  Neurology  VVS  Nephrology   PMT  Procedures:    Antimicrobials:  Cefepime 2/19  Vancomycin 2/19, 2/21, 2/22  Interval history/Subjective: Feels okay.  No pain, no complaints.  Objective: Vitals:  Vitals:   11/08/17 0950 11/08/17 1256  BP: (!) 154/56   Pulse: 90   Resp: 14   Temp: 97.7 F (36.5 C) 98.6 F (37 C)  SpO2: 100%     Exam:  Constitutional:  . Appears calm and comfortable Eyes:  . pupils and irises appear normal . Normal lids  ENMT:  . Hard of hearing Respiratory:  . CTA bilaterally, no w/r/r.  . Respiratory effort normal Cardiovascular:  . RRR, no m/r/g . No LE extremity edema   Skin:  . Wounds covered Psychiatric:  . Mental status o Mood, affect appropriate . judgement and insight difficult to  gauge   I have personally reviewed the following:   Labs:  INR 2.33  Scheduled Meds: . aspirin  81 mg Oral Daily  .  carvedilol  25 mg Oral BID  . collagenase   Topical Daily  . doxercalciferol  1 mcg Intravenous Q M,W,F-HD  . ezetimibe  10 mg Oral Daily  . feeding supplement (NEPRO CARB STEADY)  237 mL Oral Q M,W,F  . fenofibrate  160 mg Oral Daily  . finasteride  5 mg Oral Daily  . folic acid-pyridoxine-cyancobalamin  1 tablet Oral QHS  . gabapentin  100 mg Oral Daily   And  . gabapentin  200 mg Oral QHS  . insulin aspart  0-9 Units Subcutaneous TID WC  . insulin glargine  5 Units Subcutaneous QHS  . lanthanum  1,000 mg Oral TID WC  . Melatonin  3 mg Oral QHS  . predniSONE  20 mg Oral Q breakfast   Continuous Infusions: . sodium chloride    . sodium chloride    . sodium chloride    . sodium chloride    . ceFEPime (MAXIPIME) IV Stopped (11/07/17 2300)  . vancomycin Stopped (11/07/17 1248)    Principal Problem:   Weakness Active Problems:   Essential hypertension   Diabetes mellitus due to underlying condition with diabetic nephropathy (HCC)   Pacemaker   ESRD (end stage renal disease) on dialysis [facility])   Atrial fibrillation, permanent (HCC)   Myasthenia gravis ([place])   Benign essential HTN   Goals of care, counseling/discussion   Cellulitis   Cellulitis of right lower extremity   Generalized weakness   Palliative care by specialist   LOS: 4 days   Time > 35 minutes, >50% in counseling and coordination of care, discussion with wife prognosis and treatment recommendations and discussion of weakness with Dr. Rory Percy.

## 2017-11-08 NOTE — Progress Notes (Addendum)
NIF > -40 with good effort and technique  VC 1.8L

## 2017-11-08 NOTE — Progress Notes (Addendum)
Subjective:  Slept better / tolerated HD yesterday    Objective Vital signs in last 24 hours: Vitals:   11/08/17 0545 11/08/17 0546 11/08/17 0950 11/08/17 1256  BP:   (!) 154/56   Pulse: 85 89 90   Resp: (!) 25 (!) 21 14   Temp:   97.7 F (36.5 C) 98.6 F (37 C)  TempSrc:   Oral Oral  SpO2: 96% 98% 100%   Weight:      Height:       Weight change: -2.9 kg (-6.3 oz)  Physical Exam: General: alert ,thin elderly white  NAD   Heart: RRR, noi m,r,g  Lungs: No JVD  CTA bilat Abdomen: soft , nt, nd Extremities: Bilat lower ft wrapped in boot , no edema noted lower extrem  Multiple skin ulcers  Dialysis Access: RUA AVF pos bruit     OP Dialysis: [facility] MWF 4h   78kg   2/2.25  P2   Hep none   RUA AVF -Hectorol 1 mcg IV TIW -Mircera 225 mcg IV Q 2 weeks (last dose 10/29/17 Last Hgb 9.6)   Problem/Plan: 1  Bilat foot ulcers/ R lat malleolus osteo - on IV abx,noted per Dr Donnetta Hutching notes  Pt refusing bilat amputations/, noted  Palliative care  Seeing  2  Myasthenia gravis, progressive debility 3  ESRD HD MWF-  had thoughts "About what if I stop HD " told would die  Then stated  currently he would like to continue with HD ,discuss further tomor / noted Palliative plans further meeting tomorrow   4  Afib coumadin on hold 5  Vol no excess, at dry wt 6  Anemia ckd - cont esa as needed, next dose due 2/27 7. MBD= phos 1.7.>2.3 / corec Ca 10.8 on Binders will dc phos binders( calcium acetate)     Ernest Haber, PA-C [place] 11/08/2017,1:11 PM  LOS: 4 days   Pt seen, examined and agree w A/P as above.  Kelly Splinter MD [facility] pager [phone]   11/08/2017, 1:27 PM       Labs: Basic Metabolic Panel: Recent Labs  Lab 11/06/17 0312 11/06/17 1812 11/07/17 0744  NA 129* 133* 136  K 3.6 3.9 3.7  CL 91* 94* 98*  CO2 24 26 24   GLUCOSE 170* 139* 180*  BUN 74* 22* 36*  CREATININE 6.98* 3.25* 4.69*  CALCIUM 9.1 8.5*  9.0  PHOS  --  1.7* 2.3*   Liver Function Tests: Recent Labs  Lab 11/04/17 1500 11/06/17 1812 11/07/17 0744  AST 22  --   --   ALT 23  --   --   ALKPHOS 49  --   --   BILITOT 0.7  --   --   PROT 6.7  --   --   ALBUMIN 2.4* 2.2* 2.0*   No results for input(s): LIPASE, AMYLASE in the last 168 hours. No results for input(s): AMMONIA in the last 168 hours. CBC: Recent Labs  Lab 11/04/17 1500 11/05/17 0637 11/06/17 0312 11/06/17 1812 11/07/17 0744  WBC 13.3* 11.8* 10.8* 11.3* 10.4  NEUTROABS 11.6*  --  8.9*  --  8.4*  HGB 10.4* 9.2* 8.7* 9.8* 8.8*  HCT 33.6* 30.1* 28.0* 31.8* 29.8*  MCV 104.0* 104.9* 102.6* 102.3* 103.8*  PLT 402* 357 371 372 355   Cardiac Enzymes: No results for input(s): CKTOTAL, CKMB, CKMBINDEX, TROPONINI in the last 168 hours. CBG: Recent Labs  Lab 11/07/17 0750 11/07/17 1732 11/07/17  2113 11/08/17 0739 11/08/17 1231  GLUCAP 175* 247* 265* 147* 322*    Studies/Results: No results found. Medications: . sodium chloride    . sodium chloride    . sodium chloride    . sodium chloride    . ceFEPime (MAXIPIME) IV Stopped (11/07/17 2300)  . vancomycin Stopped (11/07/17 1248)   . aspirin  81 mg Oral Daily  . calcium acetate  1,334 mg Oral TID WC  . carvedilol  25 mg Oral BID  . collagenase   Topical Daily  . doxercalciferol  1 mcg Intravenous Q M,W,F-HD  . ezetimibe  10 mg Oral Daily  . feeding supplement (NEPRO CARB STEADY)  237 mL Oral Q M,W,F  . fenofibrate  160 mg Oral Daily  . finasteride  5 mg Oral Daily  . folic acid-pyridoxine-cyancobalamin  1 tablet Oral QHS  . gabapentin  100 mg Oral Daily   And  . gabapentin  200 mg Oral QHS  . insulin aspart  0-9 Units Subcutaneous TID WC  . insulin glargine  5 Units Subcutaneous QHS  . lanthanum  1,000 mg Oral TID WC  . Melatonin  3 mg Oral QHS  . predniSONE  20 mg Oral Q breakfast

## 2017-11-08 NOTE — Progress Notes (Signed)
NIF >-40 with good effort and patient performed VC 1.5 L

## 2017-11-08 NOTE — Progress Notes (Signed)
Daily Progress Note   Patient Name: James David       Date: 11/08/2017 DOB: 11-May-1936  Age: 82 y.o. MRN#: [id number] Attending Physician: Samuella Cota, MD Primary Care Physician: [name], MD Admit Date: 11/04/2017  Reason for Consultation/Follow-up: Establishing goals of care and Psychosocial/spiritual support  Subjective: Pt seen, chart reviewed. Neurology at the bedside and shares that pt's weakness likely not related to myasthenia gravis. Also recommended against myelogram as he had one in Dec.Discussed with pt and his wife at length that LE weakness related to his severe peripheral vascular disease and assc cellulitis.  We also discussed vascular recommendations for bilateral AKA , risks/benefits ascc with surgery and his current quality of life. Pt is already undergoing HD and "I hate it". Discussed additionally that surgery could exacerbate underlying myasthenia gravis and could have complications extubating post-op  As noted per palliative note 2/22 pt has a MOST form from 12/18 reflecting full scope of treatment but this was prior to new exacerbation of LE cellulitis.  Pt becomes very emotional at this point and we agreed to talk further on 2/24. His wife tells him she supports whatever his wishes are but has shared with me privately she doesn't think she could care for him if he becomes more debilitated.   Length of Stay: 4  Current Medications: Scheduled Meds:  . aspirin  81 mg Oral Daily  . calcium acetate  1,334 mg Oral TID WC  . carvedilol  25 mg Oral BID  . collagenase   Topical Daily  . doxercalciferol  1 mcg Intravenous Q M,W,F-HD  . ezetimibe  10 mg Oral Daily  . feeding supplement (NEPRO CARB STEADY)  237 mL Oral Q M,W,F  . fenofibrate  160 mg Oral  Daily  . finasteride  5 mg Oral Daily  . folic acid-pyridoxine-cyancobalamin  1 tablet Oral QHS  . gabapentin  100 mg Oral Daily   And  . gabapentin  200 mg Oral QHS  . insulin aspart  0-9 Units Subcutaneous TID WC  . insulin glargine  5 Units Subcutaneous QHS  . lanthanum  1,000 mg Oral TID WC  . Melatonin  3 mg Oral QHS  . predniSONE  20 mg Oral Q breakfast    Continuous Infusions: . sodium chloride    . sodium  chloride    . sodium chloride    . sodium chloride    . ceFEPime (MAXIPIME) IV Stopped (11/07/17 2300)  . vancomycin Stopped (11/07/17 1248)    PRN Meds: sodium chloride, sodium chloride, sodium chloride, sodium chloride, acetaminophen **OR** acetaminophen, hydrALAZINE, lidocaine (PF), lidocaine-prilocaine, lidocaine-prilocaine, ondansetron **OR** ondansetron (ZOFRAN) IV, pentafluoroprop-tetrafluoroeth, pentafluoroprop-tetrafluoroeth  Physical Exam  Constitutional: He is oriented to person, place, and time. He appears well-developed and well-nourished.  Neck: Normal range of motion.  Cardiovascular: Normal rate.  Pulmonary/Chest: Effort normal.  Musculoskeletal: Normal range of motion.  Neurological: He is alert and oriented to person, place, and time.  Skin: Skin is warm and dry.  Psychiatric:  Tearful, overwhelmed  Nursing note and vitals reviewed.           Vital Signs: BP (!) 154/56 (BP Location: Left Arm)   Pulse 90   Temp 97.7 F (36.5 C) (Oral)   Resp 14   Ht 6' (1.829 m)   Wt 77.9 kg (171 lb 11.8 oz)   SpO2 100%   BMI 23.29 kg/m  SpO2: SpO2: 100 % O2 Device: O2 Device: Not Delivered O2 Flow Rate:    Intake/output summary:   Intake/Output Summary (Last 24 hours) at 11/08/2017 1110 Last data filed at 11/08/2017 5093 Gross per 24 hour  Intake 460 ml  Output 1320 ml  Net -860 ml   LBM: Last BM Date: 11/07/17 Baseline Weight: Weight: 79.4 kg (175 lb) Most recent weight: Weight: 77.9 kg (171 lb 11.8 oz)       Palliative  Assessment/Data:    Flowsheet Rows     Most Recent Value  Intake Tab  Referral Department  Hospitalist  Unit at Time of Referral  Med/Surg Unit  Palliative Care Primary Diagnosis  Other (Comment)  Date Notified  11/05/17  Palliative Care Type  New Palliative care  Reason for referral  Clarify Goals of Care  Date of Admission  11/04/17  Date first seen by Palliative Care  11/07/17  # of days Palliative referral response time  2 Day(s)  # of days IP prior to Palliative referral  1  Clinical Assessment  Palliative Performance Scale Score  40%  Pain Max last 24 hours  Not able to report  Pain Min Last 24 hours  Not able to report  Dyspnea Max Last 24 Hours  Not able to report  Dyspnea Min Last 24 hours  Not able to report  Nausea Max Last 24 Hours  Not able to report  Nausea Min Last 24 Hours  Not able to report  Anxiety Max Last 24 Hours  Not able to report  Anxiety Min Last 24 Hours  Not able to report  Other Max Last 24 Hours  Not able to report  Psychosocial & Spiritual Assessment  Palliative Care Outcomes  Patient/Family meeting held?  Yes  Who was at the meeting?  wife and pt  Palliative Care follow-up planned  Yes, Facility      Patient Active Problem List   Diagnosis Date Noted  . Palliative care by specialist   . Cellulitis 11/04/2017  . Cellulitis of right lower extremity 11/04/2017  . Generalized weakness 11/04/2017  . Cervical stenosis of spine 08/12/2017  . UTI (urinary tract infection) 07/27/2017  . Weakness 07/27/2017  . Left elbow pain 07/27/2017  . Pressure injury of skin 06/11/2017  . Bleeding at insertion site 06/09/2017  . Myasthenia gravis ([place])   . Benign essential HTN   . Labile blood glucose   .  Dysphagia   . Leukocytosis   . Acute respiratory failure with hypoxia ([place])   . Myasthenia gravis in crisis ([place]) 03/17/2017  . History of CVA in adulthood 03/17/2017  . Atrial fibrillation, permanent ([place]) 03/17/2017  . Supratherapeutic INR  03/17/2017  . Dyslipidemia 03/17/2017  . Anemia 03/17/2017  . Low back pain 02/28/2017  . Neck pain 02/28/2017  . Polyneuropathy 02/28/2017  . Gait disturbance, post-stroke   . Subcortical infarction ([place]) 02/06/2017  . Intracranial vascular stenosis 02/04/2017  . Syncope   . Orthostatic hypotension   . Stroke ([place]) 02/01/2017  . TIA (transient ischemic attack) 01/29/2017  . CKD (chronic kidney disease) stage V requiring chronic dialysis ([place]) 01/29/2017  . DDD (degenerative disc disease) 01/29/2017  . ESRD (end stage renal disease) on dialysis ([place])   . Malignant melanoma of arm ([place]) 03/13/2016  . Renal lesion 03/10/2016  . BPH (benign prostatic hyperplasia) 03/07/2016  . Goals of care, counseling/discussion 07/05/2015  . Pacemaker 10/13/2014  . CHF (congestive heart failure) ([place]) 05/25/2014  . History of melanoma 05/25/2014  . Chronic diastolic heart failure ([place]) 03/24/2014  . Second-degree heart block 12/13/2013  . Essential hypertension 12/13/2013  . Diabetes mellitus due to underlying condition with diabetic nephropathy ([place]) 12/13/2013  . Hyperlipidemia 12/13/2013    Palliative Care Assessment & Plan   Patient Profile: 82 y.o. male  with past medical history of myasthenia gravis (diagnosed in May 2018) diabetes, end-stage renal disease on hemodialysis (started in May 2018) atrial fib, anemia, chronic lower extremity wounds, history of CVA (May 2018) admitted on 11/04/2017 with weakness.  Patient was admitted secondary to lower extremity, bilateral wounds, cellulitis.  Patient has been seen by vascular services and are recommending a bilateral AKA, tentatively planned for 11/10/2017  Consult ordered for goals of care. Pt's functional status has declined markedly over the past year; now can barely pivot to [facility]   Recommendations/Plan:  Pt states he does not want amputation  PMT to continue to follow pt to assist with further [place] specifically code status and HD in the  setting of severe PVD, cellulitis. Agreed to meet with palliative provider 11/09/17  Goals of Care and Additional Recommendations:  No amputation  Code Status:    Code Status Orders  (From admission, onward)        Start     Ordered   11/04/17 2205  Full code  Continuous     11/04/17 2205    Code Status History    Date Active Date Inactive Code Status Order ID Comments User Context   10/22/2017 13:16 10/22/2017 18:39 Full Code [id number]  Elam Dutch, MD Inpatient   07/27/2017 21:12 07/31/2017 18:44 Full Code [id number]  Karmen Bongo, MD Inpatient   06/09/2017 19:51 06/13/2017 23:05 Full Code [id number]  Rise Patience, MD ED   03/17/2017 17:29 03/26/2017 23:59 Full Code [id number]  Samella Parr, NP Inpatient   02/06/2017 16:24 02/15/2017 16:35 Full Code [id number]  Cathlyn Parsons, PA-C Inpatient   02/06/2017 16:24 02/06/2017 16:24 Full Code [id number]  Cathlyn Parsons, PA-C Inpatient   01/29/2017 17:03 02/06/2017 16:18 Full Code [id number]  Samella Parr, NP Inpatient   01/20/2014 18:56 01/21/2014 14:22 Full Code [id number]  Thompson Grayer, MD Inpatient       Prognosis:   Unable to determine  Discharge Planning:  To Be Determined    Thank you for allowing the Palliative Medicine Team to assist in the care of this patient.   Time In: 1030 Time  Out: 1115 Total Time 45 min Prolonged Time Billed  no       Greater than 50%  of this time was spent counseling and coordinating care related to the above assessment and plan.  Dory Horn, NP  Please contact Palliative Medicine Team phone at [phone] for questions and concerns.

## 2017-11-08 NOTE — Progress Notes (Signed)
Pharmacy Antibiotic Note  James David is a 82 y.o. male with history of myasthenia gravis and ESRD who presented on 11/04/2017 with weakness x 3 days concerning for a myasthenia gravis exacerbation. Further work-up revealed lower extremity wounds worsened in appearance with purulence and cellulitis with imaging concerning for possible osteomyelitis. Pharmacy was consulted 11/05/17 for restart of Vancomycin for empiric cellulitis/osteo coverage.   Back on usual HD qMWF schedule, last HD session was 2/22, pt received Vancomycin 750mg  IV at end of HD.   Plan: Continue Vancomycin 750 mg/HD-MWF Continue Cefepime 1g IV q24h  (can change to 2g IV post qHD -MWF starting on 2/25) Will continue to follow HD schedule/duration, culture results, LOT, and antibiotic de-escalation plans   Height: 6' (182.9 cm) Weight: 171 lb 11.8 oz (77.9 kg) IBW/kg (Calculated) : 77.6  Temp (24hrs), Avg:97.6 F (36.4 C), Min:97 F (36.1 C), Max:98.2 F (36.8 C)  Recent Labs  Lab 11/04/17 1500 11/04/17 1717 11/05/17 0637 11/06/17 0312 11/06/17 1812 11/07/17 0744  WBC 13.3*  --  11.8* 10.8* 11.3* 10.4  CREATININE 5.57*  --  6.31* 6.98* 3.25* 4.69*  LATICACIDVEN  --  1.36  --   --   --   --     Estimated Creatinine Clearance: 14 mL/min (A) (by C-G formula based on SCr of 4.69 mg/dL (H)).    Allergies  Allergen Reactions  . Amlodipine Besylate Swelling    Angioedema   . Lisinopril Other (See Comments)    Renal insufficiency  . Statins Other (See Comments)    Muscle aches  . Actos [Pioglitazone]     Chest pain  . Ropinirole Other (See Comments)    Causes excessive grogginess the following morning after taking  . Tape Other (See Comments)    SKIN IS THIN AND TAPE WILL ACTUALLY TEAR IT; PLEASE USE COBAN WRAP INSTEAD!!    Antimicrobials this admission: Vanc 2/19 >> *d/c'd 2/19, but restarted 2/20.  No doses missed. Cefepime 2/19 >> Flagyl 2/19 x 1 Zyvox IV x 1 on 2/20 ~3am  Dose adjustments this  admission:  Microbiology results: 2/22 MRSA PCR: negative   Thank you for allowing pharmacy to be a part of this patient's care. Nicole Cella, RPh Clinical Pharmacist Pager: [phone]) Main Rx [phone] 11/08/2017 11:24 AM

## 2017-11-09 LAB — GLUCOSE, CAPILLARY
GLUCOSE-CAPILLARY: 114 mg/dL — AB (ref 65–99)
GLUCOSE-CAPILLARY: 299 mg/dL — AB (ref 65–99)
GLUCOSE-CAPILLARY: 237 mg/dL — AB (ref 65–99)
Glucose-Capillary: 349 mg/dL — ABNORMAL HIGH (ref 65–99)

## 2017-11-09 LAB — PROTIME-INR
INR: 2.08
Prothrombin Time: 23.2 seconds — ABNORMAL HIGH (ref 11.4–15.2)

## 2017-11-09 MED ORDER — WARFARIN - PHARMACIST DOSING INPATIENT
Freq: Every day | Status: DC
  Administered 2017-11-10 – 2017-11-11 (×2)

## 2017-11-09 MED ORDER — TRAMADOL HCL 50 MG PO TABS
50.0000 mg | ORAL_TABLET | Freq: Two times a day (BID) | ORAL | Status: DC | PRN
  Administered 2017-11-09: 50 mg via ORAL
  Administered 2017-11-10 – 2017-11-12 (×2): 100 mg via ORAL
  Filled 2017-11-09: qty 2
  Filled 2017-11-09: qty 1
  Filled 2017-11-09: qty 2

## 2017-11-09 MED ORDER — CEFEPIME HCL 2 G IJ SOLR
2.0000 g | INTRAMUSCULAR | Status: DC
  Administered 2017-11-10: 2 g via INTRAVENOUS
  Filled 2017-11-09 (×3): qty 2

## 2017-11-09 MED ORDER — WARFARIN SODIUM 2 MG PO TABS
2.0000 mg | ORAL_TABLET | ORAL | Status: AC
  Administered 2017-11-10: 2 mg via ORAL
  Filled 2017-11-09: qty 1

## 2017-11-09 NOTE — Progress Notes (Addendum)
PROGRESS NOTE  James David:[id number] DOB: 10-Jul-1936 DOA: 11/04/2017 PCP: [name], MD  Brief Narrative: 82 year old man PMH myasthenia gravis on prednisone, diabetes mellitus, end-stage renal disease on hemodialysis, atrial fibrillation, chronic lower extremity wounds bilaterally with no revascularization options, presented with increasing weakness.  Admitted for further evaluation of generalized weakness, suspected lower extremity wound infection.  Seen by neurology given concern for myasthenia crisis, impression was generalized weakness not secondary to myasthenia, recommendations were to continue prednisone, limit gabapentin and narcotics and avoid vancomycin.  Patient was seen by vascular surgery who recommended bilateral above-the-knee amputations. Patient was seen by nephrology for ongoing hemodialysis.  Seen by palliative care.  At this point remains full code.  Assessment/Plan Progressive weakness. Patient had cervical, lumbar and thoracic myelogram performed September 11, 2017 which showed no significant central canal stenosis to explain generalized weakness or gait instability.  This was brought to my attention by Dr. Rory Percy. We discussed case, imaging does not suggest etiology for LE weakness and no further evaluation is recommended.  Weakness is not felt to be secondary to myasthenia crisis per neurology or infection/wounds per vascular surgery. -Suspect progressive weakness multifactorial including general decline in health.  Unfortunately outside of therapy, no further recommendations. -Anticipate discharge home tomorrow  Cellulitis right lateral malleolus in setting of peripheral vascular disease, no revascularization options.  X-ray suggested the possibility of osteomyelitis.  Vascular surgery has recommended bilateral AKA which the patient has declined at this point. - Afebrile, hemodynamic stable - Stop vanc based on neurology recs - change to oral abx tomorrow after  HD  Left heel, left great toe, right anterior foot, heel, outer foot, outer ankle wounds present on admission -Continue wound care  ESRD - HD per nephrology, next session 2/25  Diabetes mellitus type 2 -CBG somewhat labile.  Continue La to ntus, sliding scale insulin. - avoid gabapentin and narcotics per neurology  Atrial fibrillation - can resume warfarin if no surgery   Anemia of chronic kidney disease -Remains stable  Myasthenia gravis - continue prednisone per neurology   PMH pacemaker, stroke 01/2017   Discussed with wife again at bedside.  Plan for discharge home tomorrow.  Patient will need a lift.  DVT prophylaxis: enoxaparin Code Status: full Family Communication: wife Disposition Plan: home with [facility], [facility]    Murray Hodgkins, MD  [facility] Direct contact: [phone] --[place]ia [place]  --[email or web address]; password [email or web address]  7PM-7AM contact night coverage as above 11/09/2017, 3:32 PM  LOS: 5 days   Consultants:  Neurology  VVS  Nephrology   PMT  Procedures:    Antimicrobials:  Cefepime 2/19  Vancomycin 2/19, 2/21, 2/22  Interval history/Subjective: Feels okay.  No complaint other than pain in calves.  Eating okay.  Objective: Vitals:  Vitals:   11/09/17 0840 11/09/17 1251  BP: (!) 142/47 (!) 124/48  Pulse: 78 94  Resp: 14 20  Temp: 99 F (37.2 C) 98.1 F (36.7 C)  SpO2: 97% 97%    Exam:  Constitutional:   . Appears calm and comfortable Respiratory:  . CTA bilaterally, no w/r/r.  . Respiratory effort normal Cardiovascular:  . RRR, no m/r/g Psychiatric:  . Mental status o Mood, affect appropriate  I have personally reviewed the following:   Labs:  Blood sugars somewhat labile but overall stable.  INR 2.08  Scheduled Meds: . aspirin  81 mg Oral Daily  . carvedilol  25 mg Oral BID  . collagenase   Topical Daily  . doxercalciferol  1 mcg Intravenous Q M,W,F-HD  . ezetimibe  10 mg Oral Daily  . feeding supplement  (NEPRO CARB STEADY)  237 mL Oral Q M,W,F  . fenofibrate  160 mg Oral Daily  . finasteride  5 mg Oral Daily  . folic acid-pyridoxine-cyancobalamin  1 tablet Oral QHS  . insulin aspart  0-9 Units Subcutaneous TID WC  . insulin glargine  5 Units Subcutaneous QHS  . lanthanum  1,000 mg Oral TID WC  . Melatonin  3 mg Oral QHS  . predniSONE  20 mg Oral Q breakfast   Continuous Infusions: . sodium chloride    . sodium chloride    . sodium chloride    . sodium chloride    . ceFEPime (MAXIPIME) IV Stopped (11/08/17 2035)    Principal Problem:   Weakness Active Problems:   Essential hypertension   Diabetes mellitus due to underlying condition with diabetic nephropathy (HCC)   Pacemaker   ESRD (end stage renal disease) on dialysis [facility])   Atrial fibrillation, permanent (HCC)   Myasthenia gravis (HCC)   Benign essential HTN   Goals of care, counseling/discussion   Cellulitis of right lower extremity   Generalized weakness   Palliative care by specialist   LOS: 5 days   Time > 35 minutes, >50% in counseling and coordination of care, discussion with wife prognosis and treatment recommendations and discussion of weakness with Dr. Rory Percy.

## 2017-11-09 NOTE — Progress Notes (Signed)
NIF > -40 VC 1.7 L Good pt effort noted.

## 2017-11-09 NOTE — Procedures (Signed)
NIF >-40. Good patient effort noted.

## 2017-11-09 NOTE — Progress Notes (Signed)
Subjective:  No /co  Objective Vital signs in last 24 hours: Vitals:   11/09/17 0451 11/09/17 0523 11/09/17 0840 11/09/17 1251  BP:  (!) 130/51 (!) 142/47 (!) 124/48  Pulse:  77 78 94  Resp:  16 14 20   Temp:  99.1 F (37.3 C) 99 F (37.2 C) 98.1 F (36.7 C)  TempSrc:  Oral Axillary   SpO2:  99% 97% 97%  Weight: 75.9 kg (167 lb 5.3 oz)     Height:       Weight change: -2 kg (-6.5 oz)  Physical Exam: General: alert ,thin elderly white  NAD   Heart: RRR, noi m,r,g  Lungs: No JVD  CTA bilat Abdomen: soft , nt, nd Extremities: Bilat lower ft wrapped in boot , no edema noted lower extrem  Multiple skin ulcers  Dialysis Access: RUA AVF pos bruit     OP Dialysis: [facility] MWF 4h   78kg   2/2.25  P2   Hep none   RUA AVF -Hectorol 1 mcg IV TIW -Mircera 225 mcg IV Q 2 weeks (last dose 10/29/17 Last Hgb 9.6)   Problem/Plan: 1  Bilat foot ulcers/ R lat malleolus osteo - on IV abx,noted per Dr Donnetta Hutching notes  Pt refusing bilat amputations/, noted  Palliative care  Seeing  2  Myasthenia gravis, progressive debility 3  ESRD HD MWF-  had thoughts "what if I stop HD ? ", told would die  Then stated  currently he would like to continue with HD , would discuss further. Palliative plans further meeting today. HD tomorrow.  4  Afib coumadin on hold 5  Vol no excess, at dry wt 6  Anemia ckd - cont esa as needed, next dose due 2/27 7. MBD= phos 1.7.>2.3 / corec Ca 10.8 on Binders will dc phos binders( calcium acetate)     Kelly Splinter MD [facility] pager [phone]   11/09/2017, 1:24 PM       Labs: Basic Metabolic Panel: Recent Labs  Lab 11/06/17 0312 11/06/17 1812 11/07/17 0744  NA 129* 133* 136  K 3.6 3.9 3.7  CL 91* 94* 98*  CO2 24 26 24   GLUCOSE 170* 139* 180*  BUN 74* 22* 36*  CREATININE 6.98* 3.25* 4.69*  CALCIUM 9.1 8.5* 9.0  PHOS  --  1.7* 2.3*   Liver Function Tests: Recent Labs  Lab 11/04/17 1500 11/06/17 1812 11/07/17 0744  AST 22  --    --   ALT 23  --   --   ALKPHOS 49  --   --   BILITOT 0.7  --   --   PROT 6.7  --   --   ALBUMIN 2.4* 2.2* 2.0*   No results for input(s): LIPASE, AMYLASE in the last 168 hours. No results for input(s): AMMONIA in the last 168 hours. CBC: Recent Labs  Lab 11/04/17 1500 11/05/17 0637 11/06/17 0312 11/06/17 1812 11/07/17 0744  WBC 13.3* 11.8* 10.8* 11.3* 10.4  NEUTROABS 11.6*  --  8.9*  --  8.4*  HGB 10.4* 9.2* 8.7* 9.8* 8.8*  HCT 33.6* 30.1* 28.0* 31.8* 29.8*  MCV 104.0* 104.9* 102.6* 102.3* 103.8*  PLT 402* 357 371 372 355   Cardiac Enzymes: No results for input(s): CKTOTAL, CKMB, CKMBINDEX, TROPONINI in the last 168 hours. CBG: Recent Labs  Lab 11/08/17 1231 11/08/17 1640 11/08/17 2209 11/09/17 0748 11/09/17 1208  GLUCAP 322* 352* 213* 114* 299*    Studies/Results: No results found. Medications: . sodium chloride    .  sodium chloride    . sodium chloride    . sodium chloride    . ceFEPime (MAXIPIME) IV Stopped (11/08/17 2035)   . aspirin  81 mg Oral Daily  . carvedilol  25 mg Oral BID  . collagenase   Topical Daily  . doxercalciferol  1 mcg Intravenous Q M,W,F-HD  . ezetimibe  10 mg Oral Daily  . feeding supplement (NEPRO CARB STEADY)  237 mL Oral Q M,W,F  . fenofibrate  160 mg Oral Daily  . finasteride  5 mg Oral Daily  . folic acid-pyridoxine-cyancobalamin  1 tablet Oral QHS  . insulin aspart  0-9 Units Subcutaneous TID WC  . insulin glargine  5 Units Subcutaneous QHS  . lanthanum  1,000 mg Oral TID WC  . Melatonin  3 mg Oral QHS  . predniSONE  20 mg Oral Q breakfast

## 2017-11-09 NOTE — Progress Notes (Signed)
ANTICOAGULATION CONSULT NOTE - Initial Consult  Pharmacy Consult for resume Coumadin Indication: h/o  atrial fibrillation  Allergies  Allergen Reactions  . Amlodipine Besylate Swelling    Angioedema   . Lisinopril Other (See Comments)    Renal insufficiency  . Statins Other (See Comments)    Muscle aches  . Actos [Pioglitazone]     Chest pain  . Ropinirole Other (See Comments)    Causes excessive grogginess the following morning after taking  . Tape Other (See Comments)    SKIN IS THIN AND TAPE WILL ACTUALLY TEAR IT; PLEASE USE COBAN WRAP INSTEAD!!    Patient Measurements: Height: 6' (182.9 cm) Weight: 167 lb 5.3 oz (75.9 kg) IBW/kg (Calculated) : 77.6 Heparin Dosing Weight:    Vital Signs: Temp: 98.2 F (36.8 C) (02/24 2050) Temp Source: Axillary (02/24 2050) BP: 137/55 (02/24 2050) Pulse Rate: 75 (02/24 2050)  Labs: Recent Labs    11/07/17 0744 11/08/17 0350 11/09/17 0334  HGB 8.8*  --   --   HCT 29.8*  --   --   PLT 355  --   --   LABPROT 28.1* 25.4* 23.2*  INR 2.66 2.33 2.08  CREATININE 4.69*  --   --     Estimated Creatinine Clearance: 13.7 mL/min (A) (by C-G formula based on SCr of 4.69 mg/dL (H)).   Medical History: Past Medical History:  Diagnosis Date  . Anemia 07/28/2017  . Atrial fibrillation ([place])    on Coumadin  . CHF (congestive heart failure) ([place])   . DDD (degenerative disc disease)   . Diabetes mellitus without complication ([place])   . ESRD (end stage renal disease) on dialysis ([place])   . Hyperlipidemia   . Hypertension   . Melanoma ([place]) 2010   removed at [facility]  . Second degree heart block    a. s/p STJ dual chamber PPM - Dr [name]  . Stroke [facility]) 01/2017    Medications:  Medications Prior to Admission  Medication Sig Dispense Refill Last Dose  . acetaminophen (TYLENOL) 500 MG tablet Take 500-1,000 mg by mouth daily as needed for moderate pain or headache.    11/03/2017 at prn  . ARTIFICIAL TEAR OP Place 1 drop into both eyes  daily.   11/02/2017  . aspirin EC 81 MG tablet Take 81 mg by mouth at bedtime.   11/03/2017 at Unknown time  . calcium acetate (PHOSLO) 667 MG capsule Take 667-1,334 mg by mouth See admin instructions. Take 1334 mg by mouth three times daily with meals, take 1 capsule (667 mg) with snacks   11/03/2017 at Unknown time  . carvedilol (COREG) 25 MG tablet Take 25 mg by mouth 2 (two) times daily.   11/03/2017 at 0830  . ENSURE (ENSURE) Take 1 Can by mouth every other day. On non dialysis days   11/03/2017 at Unknown time  . ezetimibe (ZETIA) 10 MG tablet Take 1 tablet (10 mg total) by mouth daily. 30 tablet 1 11/03/2017 at Unknown time  . fenofibrate 160 MG tablet Take 160 mg by mouth daily.   11/03/2017 at Unknown time  . finasteride (PROSCAR) 5 MG tablet Take 1 tablet (5 mg total) by mouth daily. 30 tablet 1 11/03/2017 at Unknown time  . folic acid-vitamin b complex-vitamin c-selenium-zinc (DIALYVITE) 3 MG TABS tablet Take 1 tablet at bedtime by mouth.   11/03/2017 at Unknown time  . gabapentin (NEURONTIN) 100 MG capsule Take one tablet in the morning and two tablets at night. 90 capsule 1  11/03/2017 at Unknown time  . glimepiride (AMARYL) 1 MG tablet Take 1 tablet (1 mg total) by mouth daily with breakfast. (Patient taking differently: Take 1-2 mg by mouth daily with breakfast. Take 2mg  once daily if blood sugar is 150 or higher) 30 tablet 1 11/03/2017 at Unknown time  . lanthanum (FOSRENOL) 1000 MG chewable tablet Chew 1,000 mg by mouth 3 (three) times daily with meals.   11/03/2017 at Unknown time  . Melatonin 3 MG TABS Take 3 mg at bedtime by mouth.   11/03/2017 at Unknown time  . Nutritional Supplements (FEEDING SUPPLEMENT, NEPRO CARB STEADY,) LIQD Take 237 mLs by mouth 2 (two) times daily between meals. (Patient taking differently: Take 237 mLs by mouth every Monday, Wednesday, and Friday. At dialysis)  0 11/03/2017 at Unknown time  . povidone-iodine (BETADINE) 10 % ointment Apply 1 application topically 3  (three) times a week.   10/30/2017  . predniSONE (DELTASONE) 20 MG tablet Take 1 tablet (20 mg total) by mouth daily with breakfast. Please note to continue taking 20 mg tablet for now  and you must be evaluated by your neurologist (Dr. [name]) to determine next Prednisone dose and frequency. 7 tablet 1 11/03/2017 at Unknown time  . traMADol (ULTRAM) 50 MG tablet Take 50-100 mg by mouth 2 (two) times daily as needed for moderate pain.   11/03/2017 at prn  . warfarin (COUMADIN) 1 MG tablet 1 1/2 tab daily (Patient taking differently: Take 1 mg by mouth daily with supper. ) 30 tablet 1 11/03/2017 at Unknown time  . amoxicillin-clavulanate (AUGMENTIN) 875-125 MG tablet Take 1 tablet by mouth 2 (two) times daily. One po bid x 7 days (Patient not taking: Reported on 10/21/2017) 14 tablet 0 Completed Course at Unknown time  . ceFEPIme 2 g in dextrose 5 % 50 mL Inject 2 g every Monday, Wednesday, and Friday at 6 PM into the vein. (Patient not taking: Reported on 10/21/2017)   Completed Course at Unknown time  . collagenase (SANTYL) ointment Apply daily topically. (Patient not taking: Reported on 10/21/2017) 15 g 0 Not Taking at Unknown time  . HYDROcodone-acetaminophen (NORCO/VICODIN) 5-325 MG tablet Take 1-2 tablets by mouth every 4 (four) hours as needed for moderate pain or severe pain. (Patient not taking: Reported on 10/21/2017) 20 tablet 0 Completed Course at Unknown time    Assessment: 82 y.o male on coumadin PTA for h/o Afib.  Coumadin held on admit 2/19 pending possible bilater AKA surgery.  Surgery has been canceled due to patient does not want surgery.  INR 2.08 trending down while off coumadin, last taken 2/18 pta * PTA dose 1 mg/day ( INR 3.0 on admit 2/19)  INR then peaked to 3.56 on 2/21  Possibly 2/2 to drug interaction from Flagyl 500mg  x1 on 2/19 then INR has trending down while coumadin on hold. Restarting coumadin tonight.  CBC on 2/22: Hgb 8.8, pltc wnl. No bleeding noted   Goal of Therapy:  INR  2-3 Monitor platelets by anticoagulation protocol: Yes   Plan:  Coumadin 2 mg po x 1 Daily PT/INR  Thank you for allowing pharmacy to be part of this patients care team. Nicole Cella, [place] Clinical Pharmacist Pager: [phone] 11/09/2017,11:03 PM

## 2017-11-10 ENCOUNTER — Encounter (HOSPITAL_COMMUNITY)
Admission: EM | Disposition: A | Discharge status: Home Health | Payer: Self-pay | Source: Home / Self Care | Attending: Family Medicine

## 2017-11-10 LAB — CBC
HEMATOCRIT: 29 % — AB (ref 39.0–52.0)
Hemoglobin: 8.7 g/dL — ABNORMAL LOW (ref 13.0–17.0)
MCH: 30.4 pg (ref 26.0–34.0)
MCHC: 30 g/dL (ref 30.0–36.0)
MCV: 101.4 fL — ABNORMAL HIGH (ref 78.0–100.0)
PLATELETS: 408 10*3/uL — AB (ref 150–400)
RBC: 2.86 MIL/uL — ABNORMAL LOW (ref 4.22–5.81)
RDW: 17.3 % — AB (ref 11.5–15.5)
WBC: 12.4 10*3/uL — ABNORMAL HIGH (ref 4.0–10.5)

## 2017-11-10 LAB — RENAL FUNCTION PANEL
ALBUMIN: 2.1 g/dL — AB (ref 3.5–5.0)
Anion gap: 16 — ABNORMAL HIGH (ref 5–15)
BUN: 73 mg/dL — AB (ref 6–20)
CO2: 21 mmol/L — ABNORMAL LOW (ref 22–32)
Calcium: 9 mg/dL (ref 8.9–10.3)
Chloride: 96 mmol/L — ABNORMAL LOW (ref 101–111)
Creatinine, Ser: 6.96 mg/dL — ABNORMAL HIGH (ref 0.61–1.24)
GFR calc Af Amer: 8 mL/min — ABNORMAL LOW (ref >60)
GFR calc non Af Amer: 7 mL/min — ABNORMAL LOW (ref >60)
Glucose, Bld: 88 mg/dL (ref 65–99)
PHOSPHORUS: 3.7 mg/dL (ref 2.5–4.6)
POTASSIUM: 4 mmol/L (ref 3.5–5.1)
Sodium: 133 mmol/L — ABNORMAL LOW (ref 135–145)

## 2017-11-10 LAB — GLUCOSE, CAPILLARY
GLUCOSE-CAPILLARY: 79 mg/dL (ref 65–99)
Glucose-Capillary: 122 mg/dL — ABNORMAL HIGH (ref 65–99)
Glucose-Capillary: 167 mg/dL — ABNORMAL HIGH (ref 65–99)
Glucose-Capillary: 184 mg/dL — ABNORMAL HIGH (ref 65–99)

## 2017-11-10 LAB — PROTIME-INR
INR: 1.87
Prothrombin Time: 21.4 seconds — ABNORMAL HIGH (ref 11.4–15.2)

## 2017-11-10 SURGERY — AMPUTATION, ABOVE KNEE
Anesthesia: Choice | Laterality: Bilateral

## 2017-11-10 MED ORDER — DOXERCALCIFEROL 4 MCG/2ML IV SOLN
INTRAVENOUS | Status: AC
  Filled 2017-11-10: qty 2

## 2017-11-10 MED ORDER — ACETAMINOPHEN 325 MG PO TABS
ORAL_TABLET | ORAL | Status: AC
  Filled 2017-11-10: qty 2

## 2017-11-10 MED ORDER — WARFARIN SODIUM 2 MG PO TABS
2.0000 mg | ORAL_TABLET | Freq: Once | ORAL | Status: AC
  Administered 2017-11-10: 2 mg via ORAL
  Filled 2017-11-10: qty 1

## 2017-11-10 NOTE — Progress Notes (Signed)
[place] for resume Coumadin Indication: h/o  atrial fibrillation  Allergies  Allergen Reactions  . Amlodipine Besylate Swelling    Angioedema   . Lisinopril Other (See Comments)    Renal insufficiency  . Statins Other (See Comments)    Muscle aches  . Actos [Pioglitazone]     Chest pain  . Ropinirole Other (See Comments)    Causes excessive grogginess the following morning after taking  . Tape Other (See Comments)    SKIN IS THIN AND TAPE WILL ACTUALLY TEAR IT; PLEASE USE COBAN WRAP INSTEAD!!    Patient Measurements: Height: 6' (182.9 cm) Weight: 170 lb 13.7 oz (77.5 kg) IBW/kg (Calculated) : 77.6 Heparin Dosing Weight:    Vital Signs: Temp: 97.8 F (36.6 C) (02/25 0850) Temp Source: Oral (02/25 0850) BP: 116/61 (02/25 1100) Pulse Rate: 85 (02/25 1100)  Labs: Recent Labs    11/08/17 0350 11/09/17 0334 11/10/17 0409 11/10/17 0900  HGB  --   --   --  8.7*  HCT  --   --   --  29.0*  PLT  --   --   --  408*  LABPROT 25.4* 23.2* 21.4*  --   INR 2.33 2.08 1.87  --   CREATININE  --   --   --  6.96*    Estimated Creatinine Clearance: 9.4 mL/min (A) (by C-G formula based on SCr of 6.96 mg/dL (H)).   Assessment: 82 y.o male on coumadin PTA for h/o Afib.  Coumadin held on admit 2/19 pending possible bilater AKA surgery.  Surgery has been canceled due to patient does not want surgery.   INR today = 1.87   * PTA dose 1 mg/day ( INR 3.0 on admit 2/19)  INR then peaked to 3.56 on 2/21  Possibly 2/2 to drug interaction from Flagyl 500mg  x1 on 2/19 then INR has trended down while coumadin on hold.    Goal of Therapy:  INR 2-3 Monitor platelets by anticoagulation protocol: Yes   Plan:  Repeat Coumadin 2 mg po x 1 tonight Daily PT/INR  Thank you Anette Guarneri, PharmD [phone] 11/10/2017,11:11 AM

## 2017-11-10 NOTE — Progress Notes (Signed)
PROGRESS NOTE  James David:[id number] DOB: 09-Aug-1936 DOA: 11/04/2017 PCP: [name], MD  Brief Narrative: 82 year old man PMH myasthenia gravis on prednisone, diabetes mellitus, end-stage renal disease on hemodialysis, atrial fibrillation, chronic lower extremity wounds bilaterally with no revascularization options, presented with increasing weakness.  Admitted for further evaluation of generalized weakness, suspected lower extremity wound infection.  Seen by neurology given concern for myasthenia crisis, impression was generalized weakness not secondary to myasthenia, recommendations were to continue prednisone, limit gabapentin and narcotics and avoid vancomycin.  Patient was seen by vascular surgery who recommended bilateral above-the-knee amputations. Patient was seen by nephrology for ongoing hemodialysis.  Seen by palliative care.  At this point remains full code.  Assessment/Plan Progressive weakness. Patient had cervical, lumbar and thoracic myelogram performed September 11, 2017 which showed no significant central canal stenosis to explain generalized weakness or gait instability.  This was brought to my attention by Dr. Rory Percy. We discussed case, imaging does not suggest etiology for LE weakness and no further evaluation is recommended.  Weakness is not felt to be secondary to myasthenia crisis per neurology or infection/wounds per vascular surgery. -Suspect progressive weakness multifactorial including general decline in health.  Unfortunately outside of therapy, no further recommendations. -Anticipate discharge home when lift and DME arranged.  Cellulitis right lateral malleolus in setting of peripheral vascular disease, no revascularization options.  X-ray suggested the possibility of osteomyelitis.  Vascular surgery has recommended bilateral AKA which the patient has declined at this point. - change to oral suppressive abx on discharge  Left heel, left great toe, right  anterior foot, heel, outer foot, outer ankle wounds present on admission -continue wound care  ESRD - HD per nephrology, s/p HD 2/25  Diabetes mellitus type 2 -CBG stable. Continue Lantus, SSI - avoid gabapentin and narcotics per neurology  Atrial fibrillation - can resume warfarin on discharge  Anemia of chronic kidney disease -stable  Myasthenia gravis - continue prednisone per neurology   PMH pacemaker, stroke 01/2017   Discussed with wife again at bedside 2/25.  Plan for discharge home tomorrow with [facility] and DME  DVT prophylaxis: enoxaparin Code Status: full Family Communication: wife Disposition Plan: home with HHPT, OT, RN, aide, social work, DME lift   Murray Hodgkins, MD  [facility] Direct contact: [phone] --[place]ia [place]  --[email or web address]; password [email or web address]  7PM-7AM contact night coverage as above 11/10/2017, 4:26 PM  LOS: 6 days   Consultants:  Neurology  VVS  Nephrology   PMT  Procedures:    Antimicrobials:  Cefepime 2/19  Vancomycin 2/19, 2/21, 2/22  Interval history/Subjective: C/o some pain in sacral area. Eating ok. Breathing ok.  Objective: Vitals:  Vitals:   11/10/17 1229 11/10/17 1258  BP: 120/65 (!) 143/67  Pulse: 83 77  Resp: 16 18  Temp:  97.8 F (36.6 C)  SpO2:  99%    Exam:  Constitutional:   . Appears calm and comfortable Respiratory:  . CTA bilaterally, no w/r/r.  . Respiratory effort normal Cardiovascular:  . RRR, no m/r/g Psychiatric:  . Mental status o Mood, affect appropriate  I have personally reviewed the following:   Labs:  CBG stable  BMP noted  Hgb stable 8.7  INR 1.87  Scheduled Meds: . aspirin  81 mg Oral Daily  . carvedilol  25 mg Oral BID  . collagenase   Topical Daily  . doxercalciferol  1 mcg Intravenous Q M,W,F-HD  . ezetimibe  10 mg Oral Daily  . feeding supplement (  NEPRO CARB STEADY)  237 mL Oral Q M,W,F  . fenofibrate  160 mg Oral Daily  . finasteride  5 mg Oral  Daily  . folic acid-pyridoxine-cyancobalamin  1 tablet Oral QHS  . insulin aspart  0-9 Units Subcutaneous TID WC  . insulin glargine  5 Units Subcutaneous QHS  . lanthanum  1,000 mg Oral TID WC  . Melatonin  3 mg Oral QHS  . predniSONE  20 mg Oral Q breakfast  . warfarin  2 mg Oral ONCE-1800  . Warfarin - Pharmacist Dosing Inpatient   Does not apply q1800   Continuous Infusions: . sodium chloride    . sodium chloride    . sodium chloride    . sodium chloride    . ceFEPime (MAXIPIME) IV 2 g (11/10/17 1224)    Principal Problem:   Weakness Active Problems:   Essential hypertension   Diabetes mellitus due to underlying condition with diabetic nephropathy (HCC)   Pacemaker   ESRD (end stage renal disease) on dialysis (HCC)   Atrial fibrillation, permanent (HCC)   Myasthenia gravis ([place])   Benign essential HTN   Goals of care, counseling/discussion   Cellulitis of right lower extremity   Generalized weakness   Palliative care by specialist   LOS: 6 days

## 2017-11-10 NOTE — Progress Notes (Signed)
PT Cancellation Note  Patient Details Name: James David MRN: [id number] DOB: 11-Jan-1936   Cancelled Treatment:    Reason Eval/Treat Not Completed: Patient at procedure or test/unavailable   Currently in HD;  Will follow up later today as time allows;  Otherwise, will follow up for PT tomorrow;   Noted considering DC home per chart review, and pt's wife is concerned about her ability to care for Mr. Rzepka;   While based on PT eval SNF is still our official recommendation, we have noted pt's wishes as well;   For DC home, recommend:  Mechanical Lift;  Hospital bed, wheelchair with cushion (if they don't already have one);  Consider air overlay for hospital bed;   Ambulance transport home;   HHPT/OT  [facility], [place],   [place] Social Work  Thank you,  Roney Marion, [place] Pager [phone] Office [phone]     Colletta [place] 11/10/2017, 11:38 AM

## 2017-11-10 NOTE — Care Management Note (Signed)
Case Management Note  Patient Details  Name: TYRANN DONAHO MRN: [id number] Date of Birth: 1935/12/06  Subjective/Objective: Presented with weakness. Hx of myasthenia gravis on prednisone, diabetes mellitus, end-stage renal disease on hemodialysis, atrial fibrillation, chronic lower extremity wounds bilaterally. From home with wife.  MALACAI GRANTZ (Spouse)     [phone]      PCP: Lona Kettle  Action/Plan: Pt declined SNF placement, will transition to  home with home health services when medically stable.  Pt will need ambulance transportation to home.   Expected Discharge Date:                  Expected Discharge Plan:  [place]  In-House Referral:     Discharge planning Services  CM Consult  Post Acute Care Choice:    Choice offered to:     DME Arranged:   mechanical hoyer lift DME Agency:   [facility]  HH Arranged:  PT, OT, Nurse's Aide, RN, Social Work [employer] Agency:   [facility] [place]  Status of Service:  completed  If discussed at [employer], dates discussed:    Additional Comments:  Sharin Mons, RN 11/10/2017, 3:24 PM

## 2017-11-10 NOTE — Progress Notes (Signed)
Physical Therapy Treatment Patient Details Name: James David MRN: [id number] DOB: 09-09-1936 Today's Date: 11/10/2017    History of Present Illness Pt is a 82 y/o male male with history of myasthenia gravis on prednisone, diabetes mellitus, ESRD on hemodialysis on Monday Wednesday and Friday, atrial fibrillation, anemia, chronic lower extremity wounds who had recent arteriogram done by Dr. Oneida Alar presents to the ER with complaints of increasing weakness since morning.  Patient states that he was feeling generally weak. Patient also has benign chronic lower extremity wounds with ulceration. Vascular was consulted and recommended bilateral LE amputations.    PT Comments    Continuing work on functional mobility and activity tolerance;  James David is showing improvement in sitting balance and activity tolerance; Able to tolerate OOB to chair transfer via lateral scoot with +2 Total assist;   I remain concerned about the Czajka' ability to manage at home, and recommended SNF for post-acute rehab to them again, but they continue to choose to decline SNF;   Discussed with Case Mgmnt and Dr. Sarajane Jews my concerns; would recommend maximizing James David services at home.  Follow Up Recommendations  SNF;Supervision/Assistance - 24 hour;Other (comment)(pt's spouse really wants him to return home)     Equipment Recommendations  Other (comment)(mechanical lift)    Recommendations for Other Services Other (comment)(Palliative Consult)     Precautions / Restrictions Precautions Precautions: Fall    Mobility  Bed Mobility Overal bed mobility: Needs Assistance Bed Mobility: Rolling;Sidelying to Sit Rolling: Mod assist Sidelying to sit: Max assist       General bed mobility comments: increased time and effort, pt able to flex at hip and knee on R side to assist with rolling towards his L side; heavy physical assist to elevate trunk  Transfers Overall transfer level: Needs assistance Equipment used:  2 person hand held assist(and bed pad to cradle hips) Transfers: Lateral/Scoot Transfers          Lateral/Scoot Transfers: +2 physical assistance;Total assist General transfer comment: Performed dependent total assist lateral scoot transfer OOB to chair with armrest dropped; knees gently blocked for stability; Pt tolerated about 5-7 minutes OOB in the chair, and then performed drawsheet transfer recliner back to bed with 3 person total assist  Ambulation/Gait                 Stairs            Wheelchair Mobility    Modified Rankin (Stroke Patients Only)       Balance   Sitting-balance support: Feet supported;Bilateral upper extremity supported Sitting balance-Leahy Scale: Poor Sitting balance - Comments: improving sitting balance, min to mod assist with occasional brief moments with minguard assist                                    Cognition Arousal/Alertness: Awake/alert Behavior During Therapy: WFL for tasks assessed/performed Overall Cognitive Status: Impaired/Different from baseline Area of Impairment: Safety/judgement;Following commands                       Following Commands: Follows one step commands inconsistently;Follows one step commands with increased time Safety/Judgement: Decreased awareness of deficits;Decreased awareness of safety     General Comments: Decr insight into functional status and ability to manage at home      Exercises      General Comments        Pertinent Vitals/Pain Pain  Assessment: Faces Faces Pain Scale: Hurts even more Pain Location: bil LEs, other than that, unspecified Pain Descriptors / Indicators: Aching;Grimacing Pain Intervention(s): Monitored during session;Repositioned    Home Living                      Prior Function            PT Goals (current goals can now be found in the care plan section) Acute Rehab PT Goals Patient Stated Goal: return home PT Goal  Formulation: With patient/family Time For Goal Achievement: 11/19/17 Potential to Achieve Goals: Fair Progress towards PT goals: Progressing toward goals    Frequency    Min 2X/week      PT Plan Current plan remains appropriate    Co-evaluation              AM-PAC PT "6 Clicks" Daily Activity  Outcome Measure  Difficulty turning over in bed (including adjusting bedclothes, sheets and blankets)?: Unable Difficulty moving from lying on back to sitting on the side of the bed? : Unable Difficulty sitting down on and standing up from a chair with arms (e.g., wheelchair, bedside commode, etc,.)?: Unable Help needed moving to and from a bed to chair (including a wheelchair)?: Total Help needed walking in hospital room?: Total Help needed climbing 3-5 steps with a railing? : Total 6 Click Score: 6    End of Session Equipment Utilized During Treatment: Other (comment)(bed pad) Activity Tolerance: Patient limited by fatigue Patient left: in bed;with call bell/phone within reach;with bed alarm set;with family/visitor present Nurse Communication: Mobility status;Need for lift equipment PT Visit Diagnosis: Muscle weakness (generalized) (M62.81);Other abnormalities of gait and mobility (R26.89)     Time: 0277-4128 PT Time Calculation (min) (ACUTE ONLY): 32 min  Charges:  $Therapeutic Activity: 23-37 mins                    G Codes:       Roney Marion, PT  Acute Rehabilitation Services Pager [phone] Office [phone]    James David 11/10/2017, 3:53 PM

## 2017-11-10 NOTE — Progress Notes (Signed)
NIF -40. Good effort. Will continue to monitor.

## 2017-11-10 NOTE — Progress Notes (Signed)
Pt in dialysis when RT came to check BID NIF/VC.

## 2017-11-10 NOTE — Progress Notes (Signed)
Patient ID: James David, male   DOB: 22-Apr-1936, 82 y.o.   MRN: [id number]  [facility] Progress Note   Assessment/ Plan:   1.  Bilateral foot ulcers/osteomyelitis of the right lateral malleolus: On intravenous antibiotics and seemingly improving leukocytosis-awaiting labs from this morning.  He has declined amputations of the lower extremities which would be the definitive management measure understanding well the difficulties and limitations of medical management. 2. ESRD: Continue hemodialysis on a Monday/Wednesday/Friday schedule, he continues to try and grapple with the decision making regarding chronic hemodialysis versus palliative care. 3. Anemia: Low hemoglobin noted, secondary to ESA resistance/malnutrition from ongoing infection/osteomyelitis.  Denies overt loss.  Continue high-dose ESA and consider PRBCs when hemoglobin <8.0. 4. CKD-MBD: With hypophosphatemia of malnutrition, continue Hectorol for PTH suppression 5. Nutrition: With evidence of malnutrition, continue efforts at nutritional supplementation/infection control. 6. Hypertension: Blood pressure under fair control, continue to monitor with ultrafiltration and hemodialysis. 7.  Myasthenia gravis: Progressive and an additional source of his continued debilitation.  Subjective:   Reports to have had a comfortable night and with minimal pain over his legs.  He is still thinking about overall disposition.   Objective:   BP (!) 148/63   Pulse 74   Temp 97.8 F (36.6 C) (Oral)   Resp 15   Ht 6' (1.829 m)   Wt 77.5 kg (170 lb 13.7 oz)   SpO2 99%   BMI 23.17 kg/m   Physical Exam: Gen: Comfortably resting on hemodialysis CVS: Pulse regular rhythm, normal rate, S1 and S2 normal Resp: Clear to auscultation, no rales/rhonchi Abd: Soft, flat, nontender Ext: No lower extremity edema, both legs in soft boots.  Right upper arm aVF cannulated.  Labs: BMET Recent Labs  Lab 11/04/17 1500 11/05/17 0637  11/06/17 0312 11/06/17 1812 11/07/17 0744  NA 132* 133* 129* 133* 136  K 3.8 3.6 3.6 3.9 3.7  CL 89* 93* 91* 94* 98*  CO2 26 22 24 26 24   GLUCOSE 353* 191* 170* 139* 180*  BUN 54* 62* 74* 22* 36*  CREATININE 5.57* 6.31* 6.98* 3.25* 4.69*  CALCIUM 9.4 9.1 9.1 8.5* 9.0  PHOS  --   --   --  1.7* 2.3*   CBC Recent Labs  Lab 11/04/17 1500 11/05/17 0637 11/06/17 0312 11/06/17 1812 11/07/17 0744  WBC 13.3* 11.8* 10.8* 11.3* 10.4  NEUTROABS 11.6*  --  8.9*  --  8.4*  HGB 10.4* 9.2* 8.7* 9.8* 8.8*  HCT 33.6* 30.1* 28.0* 31.8* 29.8*  MCV 104.0* 104.9* 102.6* 102.3* 103.8*  PLT 402* 357 371 372 355   Medications:    . acetaminophen      . aspirin  81 mg Oral Daily  . carvedilol  25 mg Oral BID  . collagenase   Topical Daily  . doxercalciferol  1 mcg Intravenous Q M,W,F-HD  . ezetimibe  10 mg Oral Daily  . feeding supplement (NEPRO CARB STEADY)  237 mL Oral Q M,W,F  . fenofibrate  160 mg Oral Daily  . finasteride  5 mg Oral Daily  . folic acid-pyridoxine-cyancobalamin  1 tablet Oral QHS  . insulin aspart  0-9 Units Subcutaneous TID WC  . insulin glargine  5 Units Subcutaneous QHS  . lanthanum  1,000 mg Oral TID WC  . Melatonin  3 mg Oral QHS  . predniSONE  20 mg Oral Q breakfast  . Warfarin - Pharmacist Dosing Inpatient   Does not apply [id number]   Elmarie Shiley, MD 11/10/2017, 9:38 AM

## 2017-11-10 NOTE — Care Management Important Message (Signed)
Important Message  Patient Details  Name: James David MRN: [id number] Date of Birth: Nov 06, 1935   Medicare Important Message Given:  Yes    Lenell Lama 11/10/2017, 1:25 PM

## 2017-11-10 NOTE — Procedures (Signed)
Patient seen on Hemodialysis. QB 400, UF goal 2.5L Treatment adjusted as needed.  Elmarie Shiley MD [facility]. Office # [phone] Pager # [phone] 9:46 AM

## 2017-11-11 LAB — PROTIME-INR
INR: 2.56
PROTHROMBIN TIME: 27.3 s — AB (ref 11.4–15.2)

## 2017-11-11 LAB — GLUCOSE, CAPILLARY
GLUCOSE-CAPILLARY: 151 mg/dL — AB (ref 65–99)
GLUCOSE-CAPILLARY: 225 mg/dL — AB (ref 65–99)
Glucose-Capillary: 158 mg/dL — ABNORMAL HIGH (ref 65–99)
Glucose-Capillary: 109 mg/dL — ABNORMAL HIGH (ref 65–99)

## 2017-11-11 MED ORDER — DOXYCYCLINE HYCLATE 100 MG PO TABS
100.0000 mg | ORAL_TABLET | Freq: Two times a day (BID) | ORAL | Status: DC
  Administered 2017-11-11 – 2017-11-12 (×3): 100 mg via ORAL
  Filled 2017-11-11 (×3): qty 1

## 2017-11-11 MED ORDER — WARFARIN SODIUM 1 MG PO TABS
1.0000 mg | ORAL_TABLET | Freq: Once | ORAL | Status: AC
  Administered 2017-11-11: 1 mg via ORAL
  Filled 2017-11-11: qty 1

## 2017-11-11 NOTE — Progress Notes (Signed)
Patient ID: James David, male   DOB: September 27, 1935, 82 y.o.   MRN: [id number]  [facility] Progress Note   Assessment/ Plan:   1.  Bilateral foot ulcers/osteomyelitis of the right lateral malleolus: On antimicrobial coverage with intravenous cefepime, slightly leukocytosis noted yesterday which will be repeated again tomorrow with hemodialysis. He remains afebrile and without any constitutional symptoms suggestive of worsening infection. He has declined amputations. 2. ESRD: Continue hemodialysis on a Monday/Wednesday/Friday schedule with next hemodialysis ordered for tomorrow, unfortunately, worsening overall prognosis with his osteomyelitis and deteriorating myasthenia gravis. 3. Anemia: Low hemoglobin noted at 8.7, secondary to ESA resistance/malnutrition from ongoing infection/osteomyelitis.  Denies overt loss.  Continue high-dose ESA and consider PRBCs when hemoglobin <8.0. 4. CKD-MBD: With hypophosphatemia of malnutrition, continue Hectorol for PTH suppression 5. Nutrition: With evidence of malnutrition, continue efforts at nutritional supplementation/infection control. 6. Hypertension: Blood pressure under fair control, continue to monitor with ultrafiltration and hemodialysis. 7.  Myasthenia gravis: Progressive and an additional source of his continued debilitation.  Subjective:   Reports nightmares/vivid dreams overnight without any daytime hallucinations. Denies any focal weakness.    Objective:   BP (!) 139/54 (BP Location: Left Arm)   Pulse 78   Temp 98 F (36.7 C) (Oral)   Resp 17   Ht 6' (1.829 m)   Wt 76 kg (167 lb 8.8 oz) Comment: Bed Scale  SpO2 100%   BMI 22.72 kg/m   Physical Exam: Gen: Comfortably resting in bed watching TV CVS: Pulse regular rhythm, normal rate, S1 and S2 normal Resp: Clear to auscultation, no rales/rhonchi Abd: Soft, obese, nontender Ext: No lower extremity edema, both legs in soft boots.  Right upper arm aVF with good  thrill  Labs: BMET Recent Labs  Lab 11/04/17 1500 11/05/17 0637 11/06/17 0312 11/06/17 1812 11/07/17 0744 11/10/17 0900  NA 132* 133* 129* 133* 136 133*  K 3.8 3.6 3.6 3.9 3.7 4.0  CL 89* 93* 91* 94* 98* 96*  CO2 26 22 24 26 24  21*  GLUCOSE 353* 191* 170* 139* 180* 88  BUN 54* 62* 74* 22* 36* 73*  CREATININE 5.57* 6.31* 6.98* 3.25* 4.69* 6.96*  CALCIUM 9.4 9.1 9.1 8.5* 9.0 9.0  PHOS  --   --   --  1.7* 2.3* 3.7   CBC Recent Labs  Lab 11/04/17 1500  11/06/17 0312 11/06/17 1812 11/07/17 0744 11/10/17 0900  WBC 13.3*   < > 10.8* 11.3* 10.4 12.4*  NEUTROABS 11.6*  --  8.9*  --  8.4*  --   HGB 10.4*   < > 8.7* 9.8* 8.8* 8.7*  HCT 33.6*   < > 28.0* 31.8* 29.8* 29.0*  MCV 104.0*   < > 102.6* 102.3* 103.8* 101.4*  PLT 402*   < > 371 372 355 408*   < > = values in this interval not displayed.   Medications:    . aspirin  81 mg Oral Daily  . carvedilol  25 mg Oral BID  . collagenase   Topical Daily  . doxercalciferol  1 mcg Intravenous Q M,W,F-HD  . ezetimibe  10 mg Oral Daily  . feeding supplement (NEPRO CARB STEADY)  237 mL Oral Q M,W,F  . fenofibrate  160 mg Oral Daily  . finasteride  5 mg Oral Daily  . folic acid-pyridoxine-cyancobalamin  1 tablet Oral QHS  . insulin aspart  0-9 Units Subcutaneous TID WC  . insulin glargine  5 Units Subcutaneous QHS  . lanthanum  1,000 mg Oral  TID WC  . Melatonin  3 mg Oral QHS  . predniSONE  20 mg Oral Q breakfast  . Warfarin - Pharmacist Dosing Inpatient   Does not apply H1505   Elmarie Shiley, MD 11/11/2017, 11:40 AM

## 2017-11-11 NOTE — Progress Notes (Signed)
Patient has report of "feeling weird" states " I just don't feel right." patients vital signs taken and are WDL with the exception of an elevated BP 160/72. Patient also states that he has been experiencing nightmares but can not put the nightmares into words. Patients wife is at bedside and stated that the patient is more confused than baseline. Patient knows where he is and can provide correct answers to questions when asked. Patient is somewhat aphasic. Will notify MD of this change in status.

## 2017-11-11 NOTE — Progress Notes (Signed)
NIF -35  

## 2017-11-11 NOTE — Progress Notes (Signed)
NIF -38. Good effort. Will continue to monitor.

## 2017-11-11 NOTE — Progress Notes (Signed)
ANTICOAGULATION CONSULT NOTE - Follow Up Consult  Pharmacy Consult for Coumadin Indication: atrial fibrillation  Allergies  Allergen Reactions  . Amlodipine Besylate Swelling    Angioedema   . Lisinopril Other (See Comments)    Renal insufficiency  . Statins Other (See Comments)    Muscle aches  . Actos [Pioglitazone]     Chest pain  . Ropinirole Other (See Comments)    Causes excessive grogginess the following morning after taking  . Tape Other (See Comments)    SKIN IS THIN AND TAPE WILL ACTUALLY TEAR IT; PLEASE USE COBAN WRAP INSTEAD!!    Patient Measurements: Height: 6' (182.9 cm) Weight: 167 lb 8.8 oz (76 kg)(Bed Scale) IBW/kg (Calculated) : 77.6  Vital Signs: Temp: 98 F (36.7 C) (02/26 0443) Temp Source: Oral (02/26 0443) BP: 139/54 (02/26 0443) Pulse Rate: 78 (02/26 0443)  Labs: Recent Labs    11/09/17 0334 11/10/17 0409 11/10/17 0900 11/11/17 0704  HGB  --   --  8.7*  --   HCT  --   --  29.0*  --   PLT  --   --  408*  --   LABPROT 23.2* 21.4*  --  27.3*  INR 2.08 1.87  --  2.56  CREATININE  --   --  6.96*  --     Estimated Creatinine Clearance: 9.3 mL/min (A) (by C-G formula based on SCr of 6.96 mg/dL (H)).   Medications:  Scheduled:  . aspirin  81 mg Oral Daily  . carvedilol  25 mg Oral BID  . collagenase   Topical Daily  . doxercalciferol  1 mcg Intravenous Q M,W,F-HD  . doxycycline  100 mg Oral Q12H  . ezetimibe  10 mg Oral Daily  . feeding supplement (NEPRO CARB STEADY)  237 mL Oral Q M,W,F  . fenofibrate  160 mg Oral Daily  . finasteride  5 mg Oral Daily  . folic acid-pyridoxine-cyancobalamin  1 tablet Oral QHS  . insulin aspart  0-9 Units Subcutaneous TID WC  . insulin glargine  5 Units Subcutaneous QHS  . lanthanum  1,000 mg Oral TID WC  . Melatonin  3 mg Oral QHS  . predniSONE  20 mg Oral Q breakfast  . Warfarin - Pharmacist Dosing Inpatient   Does not apply q1800    Assessment: 82 y.o male on Coumadin PTA for h/o Afib.   Coumadin held on admit 2/19 pending possible bilateral AKA surgery. Surgery was cancelled due to patient refusal.   INR is therapeutic at 2.56 but rapidly increased from yesterday's INR 1.87; this could be explained by patient receiving two higher doses compared to home dose. No bleeding noted. Doxycycline added today which can affect INR.   Goal of Therapy:  INR 2-3 Monitor platelets by anticoagulation protocol: Yes   Plan:  Coumadin 1 mg po x 1 tonight Daily INR Monitor CBC and for s/sx of bleeding   [name], PharmD Candidate C/O 2021 11/11/2017,2:32 PM

## 2017-11-11 NOTE — Progress Notes (Addendum)
PROGRESS NOTE  James David ATF:[id number] DOB: 12/02/1935 DOA: 11/04/2017 PCP: [name], MD  Brief Narrative: 82 year old man PMH myasthenia gravis on prednisone, diabetes mellitus, end-stage renal disease on hemodialysis, atrial fibrillation, chronic lower extremity wounds bilaterally with no revascularization options, presented with increasing weakness.  Admitted for further evaluation of generalized weakness, suspected lower extremity wound infection.  Seen by neurology given concern for myasthenia crisis, impression was generalized weakness not secondary to myasthenia, recommendations were to continue prednisone, limit gabapentin and narcotics and avoid vancomycin.  Patient was seen by vascular surgery who recommended bilateral above-the-knee amputations.  Patient refused this.  Patient was seen by nephrology for ongoing hemodialysis.  Seen by palliative care.  At this point remains full code with full scope of care.  Skilled nursing facility recommended but patient and wife declined this.  Plan for home with home health.  Developed confusion on 2/26.  Likely mild delirium superimposed on mild dementia.  Will observe.  Unless has fever or worsening, anticipate discharge 2/27   Assessment/Plan Progressive weakness. Patient had cervical, lumbar and thoracic myelogram performed September 11, 2017 which showed no significant central canal stenosis to explain generalized weakness or gait instability.  This was brought to my attention by Dr. Rory Percy. We discussed case, imaging does not suggest etiology for LE weakness and no further evaluation is recommended.  Weakness is not felt to be secondary to myasthenia crisis per neurology or infection/wounds per vascular surgery. -Suspect progressive weakness multifactorial including general decline in health.  Unfortunately outside of therapy, no further recommendations. -Anticipate discharge home 2/27 with list, home health  Cellulitis right lateral  malleolus in setting of peripheral vascular disease, no revascularization options.  X-ray suggested the possibility of osteomyelitis.  Vascular surgery has recommended bilateral AKA which the patient has declined at this point. -Discussed with infectious disease.  Plan to discharge on oral doxycycline for 6 weeks, perhaps indefinitely.  IV ceftazidime with hemodialysis for 6 weeks.  Not a candidate for vancomycin or ciprofloxacin secondary to myasthenia gravis.  Acute delirium superimposed on mild dementia -No evidence of new infection.  Suspect leukocytosis is related to known infection and chronic wounds.  Will observe today.  Unless condition changes would anticipate discharge home tomorrow.  Left heel, left great toe, right anterior foot, heel, outer foot, outer ankle wounds present on admission -Continue wound care  ESRD - HD per nephrology, s/p HD 2/25.  Next HD 2/27.  Diabetes mellitus type 2 -Remains stable.  Continue Lantus and sliding scale insulin for - avoid gabapentin and narcotics per neurology because of myasthenia gravis.  Atrial fibrillation -Continue warfarin.  Anemia of chronic kidney disease -Remains stable.  Myasthenia gravis -Continue prednisone per neurology   PMH pacemaker, stroke 01/2017  DVT prophylaxis: enoxaparin Code Status: full Family Communication: wife at bedside 2/26 Disposition Plan: home with HHPT, OT, RN, aide, social work, DME lift   Murray Hodgkins, MD  [facility] Direct contact: [phone] --[place]ia [place]  --[email or web address]; password [email or web address]  7PM-7AM contact night coverage as above 11/11/2017, 12:43 PM  LOS: 7 days   Consultants:  Neurology  VVS  Nephrology   PMT  Procedures:    Antimicrobials:  Cefepime 2/19 >>  Doxycycline 2/26 >>  Vancomycin 2/19, 2/21, 2/22  Interval history/Subjective: Wife reports the patient is confused, this occurred yesterday evening after hemodialysis.  No focal deficits noted.   Not interested in going home today.  Not interested in food.  Discussed with nurse, nursing note reviewed.  Patient never had aphasia.  But the nurse intended to document with the patient was slow to respond  Objective: Vitals:  Vitals:   11/10/17 2115 11/11/17 0443  BP: (!) 131/92 (!) 139/54  Pulse: (!) 101 78  Resp: 17 17  Temp: 99.8 F (37.7 C) 98 F (36.7 C)  SpO2: 100% 100%    Exam:  Constitutional:   . Appears calm and comfortable, confused Eyes:  . pupils and irises appear normal . Normal lids  ENMT:  . grossly normal hearing  . Lips appear normal Respiratory:  . CTA bilaterally, no w/r/r.  . Respiratory effort normal Cardiovascular:  . RRR, no m/r/g . No LE extremity edema   Abdomen:  . Soft, ntnd Musculoskeletal:  . Tone and strength all extremities grossly normal.  Movement and strength all extremities appears to be at baseline.  Able to move all extremities to command. Neurologic:  . CN 2-12 appear intact Psychiatric:  . Mental status o Difficult to assess mood and affect. o He is confused.  Judgment and insight impaired. He does follow simple commands and appears to be close to baseline.   I have personally reviewed the following:   Labs:  INR 2.56.  BMP unremarkable.    WBC 12.4, hemoglobin stable 8.7.  Scheduled Meds: . aspirin  81 mg Oral Daily  . carvedilol  25 mg Oral BID  . collagenase   Topical Daily  . doxercalciferol  1 mcg Intravenous Q M,W,F-HD  . doxycycline  100 mg Oral Q12H  . ezetimibe  10 mg Oral Daily  . feeding supplement (NEPRO CARB STEADY)  237 mL Oral Q M,W,F  . fenofibrate  160 mg Oral Daily  . finasteride  5 mg Oral Daily  . folic acid-pyridoxine-cyancobalamin  1 tablet Oral QHS  . insulin aspart  0-9 Units Subcutaneous TID WC  . insulin glargine  5 Units Subcutaneous QHS  . lanthanum  1,000 mg Oral TID WC  . Melatonin  3 mg Oral QHS  . predniSONE  20 mg Oral Q breakfast  . Warfarin - Pharmacist Dosing  Inpatient   Does not apply q1800   Continuous Infusions: . ceFEPime (MAXIPIME) IV Stopped (11/10/17 1638)    Principal Problem:   Weakness Active Problems:   Essential hypertension   Diabetes mellitus due to underlying condition with diabetic nephropathy (HCC)   Pacemaker   ESRD (end stage renal disease) on dialysis (HCC)   Atrial fibrillation, permanent (HCC)   Myasthenia gravis ([place])   Benign essential HTN   Goals of care, counseling/discussion   Cellulitis of right lower extremity   Generalized weakness   Palliative care by specialist   LOS: 7 days

## 2017-11-12 DIAGNOSIS — Z008 Encounter for other general examination: Secondary | ICD-10-CM

## 2017-11-12 DIAGNOSIS — R4587 Impulsiveness: Secondary | ICD-10-CM

## 2017-11-12 DIAGNOSIS — R413 Other amnesia: Secondary | ICD-10-CM

## 2017-11-12 LAB — RENAL FUNCTION PANEL
ANION GAP: 15 (ref 5–15)
Albumin: 2.1 g/dL — ABNORMAL LOW (ref 3.5–5.0)
BUN: 48 mg/dL — ABNORMAL HIGH (ref 6–20)
CALCIUM: 9.1 mg/dL (ref 8.9–10.3)
CO2: 22 mmol/L (ref 22–32)
CREATININE: 5.59 mg/dL — AB (ref 0.61–1.24)
Chloride: 97 mmol/L — ABNORMAL LOW (ref 101–111)
GFR, EST AFRICAN AMERICAN: 10 mL/min — AB (ref >60)
GFR, EST NON AFRICAN AMERICAN: 9 mL/min — AB (ref >60)
Glucose, Bld: 94 mg/dL (ref 65–99)
PHOSPHORUS: 4.8 mg/dL — AB (ref 2.5–4.6)
Potassium: 4 mmol/L (ref 3.5–5.1)
SODIUM: 134 mmol/L — AB (ref 135–145)

## 2017-11-12 LAB — CBC
HCT: 28.4 % — ABNORMAL LOW (ref 39.0–52.0)
HEMOGLOBIN: 8.8 g/dL — AB (ref 13.0–17.0)
MCH: 31.4 pg (ref 26.0–34.0)
MCHC: 31 g/dL (ref 30.0–36.0)
MCV: 101.4 fL — ABNORMAL HIGH (ref 78.0–100.0)
PLATELETS: 377 10*3/uL (ref 150–400)
RBC: 2.8 MIL/uL — AB (ref 4.22–5.81)
RDW: 17 % — ABNORMAL HIGH (ref 11.5–15.5)
WBC: 11.6 10*3/uL — AB (ref 4.0–10.5)

## 2017-11-12 LAB — PROTIME-INR
INR: 3.38
PROTHROMBIN TIME: 33.9 s — AB (ref 11.4–15.2)

## 2017-11-12 LAB — GLUCOSE, CAPILLARY: Glucose-Capillary: 253 mg/dL — ABNORMAL HIGH (ref 65–99)

## 2017-11-12 MED ORDER — SODIUM CHLORIDE 0.9 % IV SOLN
2.0000 g | INTRAVENOUS | Status: DC
  Administered 2017-11-12: 2 g via INTRAVENOUS
  Filled 2017-11-12 (×2): qty 2

## 2017-11-12 MED ORDER — WARFARIN SODIUM 1 MG PO TABS: ORAL_TABLET | ORAL | 1 refills | Status: DC

## 2017-11-12 MED ORDER — DOXYCYCLINE HYCLATE 100 MG PO TABS: 100.0000 mg | ORAL_TABLET | Freq: Two times a day (BID) | ORAL | 0 refills | Status: DC

## 2017-11-12 MED ORDER — SODIUM CHLORIDE 0.9 % IV SOLN: 2.0000 g | INTRAVENOUS | Status: DC

## 2017-11-12 NOTE — Progress Notes (Signed)
ANTICOAGULATION CONSULT NOTE - Follow Up Consult  Pharmacy Consult for Coumadin Indication: atrial fibrillation  Allergies  Allergen Reactions  . Amlodipine Besylate Swelling    Angioedema   . Lisinopril Other (See Comments)    Renal insufficiency  . Statins Other (See Comments)    Muscle aches  . Actos [Pioglitazone]     Chest pain  . Ropinirole Other (See Comments)    Causes excessive grogginess the following morning after taking  . Tape Other (See Comments)    SKIN IS THIN AND TAPE WILL ACTUALLY TEAR IT; PLEASE USE COBAN WRAP INSTEAD!!    Patient Measurements: Height: 6' (182.9 cm) Weight: 169 lb 5 oz (76.8 kg) IBW/kg (Calculated) : 77.6  Vital Signs: Temp: 98 F (36.7 C) (02/27 0745) Temp Source: Oral (02/27 0745) BP: 97/53 (02/27 1030) Pulse Rate: 94 (02/27 1030)  Labs: Recent Labs    11/10/17 0409 11/10/17 0900 11/11/17 0704 11/12/17 0418 11/12/17 0820 11/12/17 0825  HGB  --  8.7*  --   --   --  8.8*  HCT  --  29.0*  --   --   --  28.4*  PLT  --  408*  --   --   --  377  LABPROT 21.4*  --  27.3* 33.9*  --   --   INR 1.87  --  2.56 3.38  --   --   CREATININE  --  6.96*  --   --  5.59*  --     Estimated Creatinine Clearance: 11.6 mL/min (A) (by C-G formula based on SCr of 5.59 mg/dL (H)).   Medications:  Scheduled:  . aspirin  81 mg Oral Daily  . carvedilol  25 mg Oral BID  . collagenase   Topical Daily  . doxercalciferol  1 mcg Intravenous Q M,W,F-HD  . doxycycline  100 mg Oral Q12H  . ezetimibe  10 mg Oral Daily  . feeding supplement (NEPRO CARB STEADY)  237 mL Oral Q M,W,F  . fenofibrate  160 mg Oral Daily  . finasteride  5 mg Oral Daily  . folic acid-pyridoxine-cyancobalamin  1 tablet Oral QHS  . insulin aspart  0-9 Units Subcutaneous TID WC  . insulin glargine  5 Units Subcutaneous QHS  . lanthanum  1,000 mg Oral TID WC  . Melatonin  3 mg Oral QHS  . predniSONE  20 mg Oral Q breakfast  . Warfarin - Pharmacist Dosing Inpatient   Does not  apply q1800    Assessment: 82 y.o male on Coumadin PTA for h/o Afib.  Coumadin held on admit 2/19 pending possible bilateral AKA surgery. Surgery was cancelled due to patient refusal.   INR is supratherapeutic today at 3.38 from 2.56 yesterday; likely from patient receiving two higher doses compared to home dose. No bleeding noted and CBC stable. Doxycycline added yesterday which can affect INR.   Goal of Therapy:  INR 2-3 Monitor platelets by anticoagulation protocol: Yes   Plan:  Hold Coumadin tonight Daily INR Monitor CBC and for s/sx of bleeding   [name], PharmD Candidate C/O 2021 11/12/2017,1:03 PM

## 2017-11-12 NOTE — Consult Note (Signed)
[facility] Psychiatry Consult   Reason for Consult:  Capacity evaluation  Referring Physician:  Dr. Sloan Leiter Patient Identification: James David MRN:  [id number] Principal Diagnosis: Evaluation by psychiatric service required Diagnosis:   Patient Active Problem List   Diagnosis Date Noted  . Palliative care by specialist [Z51.5]   . Cellulitis of right lower extremity [L03.115] 11/04/2017  . Generalized weakness [R53.1] 11/04/2017  . Cervical stenosis of spine [M48.02] 08/12/2017  . UTI (urinary tract infection) [N39.0] 07/27/2017  . Weakness [R53.1] 07/27/2017  . Left elbow pain [M25.522] 07/27/2017  . Pressure injury of skin [L89.90] 06/11/2017  . Bleeding at insertion site [L76.82] 06/09/2017  . Myasthenia gravis ([place]) [G70.00]   . Benign essential HTN [I10]   . Labile blood glucose [R73.09]   . Dysphagia [R13.10]   . Leukocytosis [D72.829]   . Acute respiratory failure with hypoxia ([place]) [J96.01]   . Myasthenia gravis in crisis ([place]) [G70.01] 03/17/2017  . History of CVA in adulthood [Z86.73] 03/17/2017  . Atrial fibrillation, permanent ([place]) [I48.2] 03/17/2017  . Supratherapeutic INR [R79.1] 03/17/2017  . Dyslipidemia [E78.5] 03/17/2017  . Anemia [D64.9] 03/17/2017  . Low back pain [M54.5] 02/28/2017  . Neck pain [M54.2] 02/28/2017  . Polyneuropathy [G62.9] 02/28/2017  . Gait disturbance, post-stroke [I69.398, R26.9]   . Subcortical infarction ([place]) [I63.9] 02/06/2017  . Intracranial vascular stenosis [I67.9] 02/04/2017  . Syncope [R55]   . Orthostatic hypotension [I95.1]   . Stroke ([place]) [I63.9] 02/01/2017  . TIA (transient ischemic attack) [G45.9] 01/29/2017  . CKD (chronic kidney disease) stage V requiring chronic dialysis ([place]) [N18.6, Z99.2] 01/29/2017  . DDD (degenerative disc disease) [IMO0002] 01/29/2017  . ESRD (end stage renal disease) on dialysis ([place]) [N18.6, Z99.2]   . Malignant melanoma of arm ([place]) [C43.60] 03/13/2016  . Renal lesion [N28.9]  03/10/2016  . BPH (benign prostatic hyperplasia) [N40.0] 03/07/2016  . Goals of care, counseling/discussion [Z71.89] 07/05/2015  . Pacemaker [Z95.0] 10/13/2014  . CHF (congestive heart failure) ([place]) [I50.9] 05/25/2014  . History of melanoma [Z85.820] 05/25/2014  . Chronic diastolic heart failure ([place]) [I50.32] 03/24/2014  . Second-degree heart block [I44.1] 12/13/2013  . Essential hypertension [I10] 12/13/2013  . Diabetes mellitus due to underlying condition with diabetic nephropathy ([place]) [E08.21] 12/13/2013  . Hyperlipidemia [E78.5] 12/13/2013    Total Time spent with patient: 1 hour  Subjective:   James David is a 82 y.o. male patient admitted with bilateral foot ulcers complicated by osteomyelitis of right lateral malleolus.  HPI:   Per chart review, patient declines amputations for current medical condition. Primary team is concerned that he does not have capacity to make this decision due to fluctuations in mental status.  On interview, [name] is only oriented to self. He believes that he is admitted to the hospital for treatment of a stroke. He is unable to identify the risks of declining surgery for his current medical condition. He denies a history of mental illness. He denies SI, HI or AVH. He reports poor sleep secondary to nightmares. His wife was at bedside and agrees to help him make decisions regarding surgery. She reports that his appetite has been poor.   Past Psychiatric History: Denies   Risk to Self: Is patient at risk for suicide?: No Risk to Others:  None. Denies HI. Prior Inpatient Therapy:  Denies  Prior Outpatient Therapy:  Denies   Past Medical History:  Past Medical History:  Diagnosis Date  . Anemia 07/28/2017  . Atrial fibrillation ([place])  on Coumadin  . CHF (congestive heart failure) ([place])   . DDD (degenerative disc disease)   . Diabetes mellitus without complication ([place])   . ESRD (end stage renal disease) on dialysis ([place])   .  Hyperlipidemia   . Hypertension   . Melanoma ([place]) 2010   removed at [facility]  . Second degree heart block    a. s/p STJ dual chamber PPM - Dr [name]  . Stroke [facility]) 01/2017    Past Surgical History:  Procedure Laterality Date  . A/V FISTULAGRAM N/A 06/11/2017   Procedure: A/V Fistulagram;  Surgeon: Waynetta Sandy, MD;  Location: [facility];  Service: Cardiovascular;  Laterality: N/A;  . ABDOMINAL AORTOGRAM N/A 10/22/2017   Procedure: ABDOMINAL AORTOGRAM;  Surgeon: Elam Dutch, MD;  Location: [facility];  Service: Cardiovascular;  Laterality: N/A;  . AV FISTULA PLACEMENT Right 05/31/2014   Procedure: Right Arm Brachiocephalic ARTERIOVENOUS FISTULA CREATION  ;  Surgeon: Conrad Leavenworth, MD;  Location: [facility];  Service: Vascular;  Laterality: Right;  . CATARACT EXTRACTION W/ INTRAOCULAR LENS  IMPLANT, BILATERAL    . COLONOSCOPY    . EXCISION MELANOMA WITH SENTINEL LYMPH NODE BIOPSY Right 02/08/2016   Procedure: WIDE EXCISION RIGHT SHOULDER MELANOMA WITH RIGHT SENTINEL LYMPH NODE BIOPSY;  Surgeon: [name], MD;  Location: [facility];  Service: General;  Laterality: Right;  . LAMINECTOMY    . LOWER EXTREMITY ANGIOGRAPHY Bilateral 10/22/2017   Procedure: Lower Extremity Angiography;  Surgeon: Elam Dutch, MD;  Location: [facility] [facility];  Service: Cardiovascular;  Laterality: Bilateral;  . PERIPHERAL VASCULAR BALLOON ANGIOPLASTY Right 06/11/2017   Procedure: PERIPHERAL VASCULAR BALLOON ANGIOPLASTY;  Surgeon: Waynetta Sandy, MD;  Location: [facility];  Service: Cardiovascular;  Laterality: Right;  arm fistula  . PERMANENT PACEMAKER INSERTION N/A 01/20/2014   STJ Assurity dual chamber pacemaker implanted by Dr [name] for 2nd degree AV block   Family History:  Family History  Problem Relation Age of Onset  . Pulmonary embolism Mother   . Diabetes Mother   . Heart attack Father   . Diabetes Brother    Family Psychiatric  History: Denies  Social  History:  Social History   Substance and Sexual Activity  Alcohol Use No     Social History   Substance and Sexual Activity  Drug Use No    Social History   Socioeconomic History  . Marital status: Married    Spouse name: [name]  . Number of children: None  . Years of education: None  . Highest education level: None  Social Needs  . Financial resource strain: None  . Food insecurity - worry: None  . Food insecurity - inability: None  . Transportation needs - medical: None  . Transportation needs - non-medical: None  Occupational History    Comment: retired  Tobacco Use  . Smoking status: Never Smoker  . Smokeless tobacco: Never Used  Substance and Sexual Activity  . Alcohol use: No  . Drug use: No  . Sexual activity: No  Other Topics Concern  . None  Social History Narrative   Lives in [place] with spouse.  Retired Theatre manager for [employer].   Additional Social History: He lives at home with his wife of 20 years. He has 2 daughters and 1 stepson. He previously worked in Theatre manager but retired in the 70s.    Allergies:   Allergies  Allergen Reactions  . Amlodipine Besylate Swelling    Angioedema   .  Lisinopril Other (See Comments)    Renal insufficiency  . Statins Other (See Comments)    Muscle aches  . Actos [Pioglitazone]     Chest pain  . Ropinirole Other (See Comments)    Causes excessive grogginess the following morning after taking  . Tape Other (See Comments)    SKIN IS THIN AND TAPE WILL ACTUALLY TEAR IT; PLEASE USE COBAN WRAP INSTEAD!!    Labs:  Results for orders placed or performed during the hospital encounter of 11/04/17 (from the past 48 hour(s))  Glucose, capillary     Status: Abnormal   Collection Time: 11/10/17  5:30 PM  Result Value Ref Range   Glucose-Capillary 167 (H) 65 - 99 mg/dL  Glucose, capillary     Status: Abnormal   Collection Time: 11/10/17  9:13 PM  Result Value Ref Range   Glucose-Capillary 184 (H) 65 - 99  mg/dL   Comment 1 Notify RN    Comment 2 Document in Chart   Protime-INR     Status: Abnormal   Collection Time: 11/11/17  7:04 AM  Result Value Ref Range   Prothrombin Time 27.3 (H) 11.4 - 15.2 seconds   INR 2.56     Comment: Performed at [facility] [address], [place], [place] [address]  Glucose, capillary     Status: Abnormal   Collection Time: 11/11/17  8:00 AM  Result Value Ref Range   Glucose-Capillary 151 (H) 65 - 99 mg/dL  Glucose, capillary     Status: Abnormal   Collection Time: 11/11/17 12:18 PM  Result Value Ref Range   Glucose-Capillary 158 (H) 65 - 99 mg/dL  Glucose, capillary     Status: Abnormal   Collection Time: 11/11/17  4:36 PM  Result Value Ref Range   Glucose-Capillary 225 (H) 65 - 99 mg/dL  Glucose, capillary     Status: Abnormal   Collection Time: 11/11/17 10:00 PM  Result Value Ref Range   Glucose-Capillary 109 (H) 65 - 99 mg/dL  Protime-INR     Status: Abnormal   Collection Time: 11/12/17  4:18 AM  Result Value Ref Range   Prothrombin Time 33.9 (H) 11.4 - 15.2 seconds   INR 3.38     Comment: Performed at [facility], [place] [address], [place], [place] [address]  Renal function panel     Status: Abnormal   Collection Time: 11/12/17  8:20 AM  Result Value Ref Range   Sodium 134 (L) 135 - 145 mmol/L   Potassium 4.0 3.5 - 5.1 mmol/L   Chloride 97 (L) 101 - 111 mmol/L   CO2 22 22 - 32 mmol/L   Glucose, Bld 94 65 - 99 mg/dL   BUN 48 (H) 6 - 20 mg/dL   Creatinine, Ser 5.59 (H) 0.61 - 1.24 mg/dL   Calcium 9.1 8.9 - 10.3 mg/dL   Phosphorus 4.8 (H) 2.5 - 4.6 mg/dL   Albumin 2.1 (L) 3.5 - 5.0 g/dL   GFR calc non Af Amer 9 (L) >60 mL/min   GFR calc Af Amer 10 (L) >60 mL/min    Comment: (NOTE) The eGFR has been calculated using the CKD EPI equation. This calculation has not been validated in all clinical situations. eGFR's persistently <60 mL/min signify possible Chronic Kidney Disease.    Anion gap 15 5 - 15    Comment: Performed at  [facility] [address], [place], [place] [address]  CBC     Status: Abnormal  Collection Time: 11/12/17  8:25 AM  Result Value Ref Range   WBC 11.6 (H) 4.0 - 10.5 K/uL   RBC 2.80 (L) 4.22 - 5.81 MIL/uL   Hemoglobin 8.8 (L) 13.0 - 17.0 g/dL   HCT 28.4 (L) 39.0 - 52.0 %   MCV 101.4 (H) 78.0 - 100.0 fL   MCH 31.4 26.0 - 34.0 pg   MCHC 31.0 30.0 - 36.0 g/dL   RDW 17.0 (H) 11.5 - 15.5 %   Platelets 377 150 - 400 K/uL    Comment: Performed at [facility] [address] [place], [place] [address]    Current Facility-Administered Medications  Medication Dose Route Frequency Provider Last Rate Last Dose  . acetaminophen (TYLENOL) tablet 650 mg  650 mg Oral Q6H PRN Rise Patience, MD   650 mg at 11/10/17 6063   Or  . acetaminophen (TYLENOL) suppository 650 mg  650 mg Rectal Q6H PRN Rise Patience, MD      . aspirin chewable tablet 81 mg  81 mg Oral Daily Patrecia Pour, Christean Grief, MD   81 mg at 11/11/17 1034  . carvedilol (COREG) tablet 25 mg  25 mg Oral BID Rise Patience, MD   25 mg at 11/11/17 2202  . cefTAZidime (FORTAZ) 2 g in sodium chloride 0.9 % 100 mL IVPB  2 g Intravenous Q M,W,F-HD Alvira Philips, RPH 200 mL/hr at 11/12/17 1204 2 g at 11/12/17 1204  . collagenase (SANTYL) ointment   Topical Daily Patrecia Pour, Christean Grief, MD      . doxercalciferol (HECTOROL) injection 1 mcg  1 mcg Intravenous Q M,W,F-HD Valentina Gu, NP   1 mcg at 11/10/17 0944  . doxycycline (VIBRA-TABS) tablet 100 mg  100 mg Oral Q12H Samuella Cota, MD   100 mg at 11/11/17 2202  . ezetimibe (ZETIA) tablet 10 mg  10 mg Oral Daily Rise Patience, MD   10 mg at 11/11/17 1033  . feeding supplement (NEPRO CARB STEADY) liquid 237 mL  237 mL Oral Q M,W,F Rise Patience, MD   237 mL at 11/10/17 1758  . fenofibrate tablet 160 mg  160 mg Oral Daily Rise Patience, MD   160 mg at 11/11/17 1034  . finasteride (PROSCAR) tablet 5 mg  5 mg Oral Daily Rise Patience, MD   5 mg at 11/11/17 1034  . folic acid-pyridoxine-cyancobalamin (FOLTX) 2.5-25-2 MG per tablet 1 tablet  1 tablet Oral QHS Rise Patience, MD   1 tablet at 11/11/17 2202  . hydrALAZINE (APRESOLINE) injection 10 mg  10 mg Intravenous Q4H PRN Rise Patience, MD      . insulin aspart (novoLOG) injection 0-9 Units  0-9 Units Subcutaneous TID WC Rise Patience, MD   3 Units at 11/11/17 1808  . insulin glargine (LANTUS) injection 5 Units  5 Units Subcutaneous QHS Patrecia Pour, Christean Grief, MD   5 Units at 11/11/17 2202  . lanthanum (FOSRENOL) chewable tablet 1,000 mg  1,000 mg Oral TID WC Rise Patience, MD   1,000 mg at 11/11/17 1808  . Melatonin TABS 3 mg  3 mg Oral QHS Rise Patience, MD   3 mg at 11/11/17 2202  . ondansetron (ZOFRAN) tablet 4 mg  4 mg Oral Q6H PRN Rise Patience, MD       Or  . ondansetron [facility]) injection 4 mg  4 mg Intravenous Q6H PRN Rise Patience, MD      .  predniSONE (DELTASONE) tablet 20 mg  20 mg Oral Q breakfast Rise Patience, MD   20 mg at 11/11/17 1033  . traMADol (ULTRAM) tablet 50-100 mg  50-100 mg Oral BID PRN Samuella Cota, MD   100 mg at 11/10/17 2130  . Warfarin - Pharmacist Dosing Inpatient   Does not apply q1800 Samuella Cota, MD        Musculoskeletal: Strength & Muscle Tone: decreased due to physical deconditioning.  Gait & Station: UTA since patient was lying in bed. Patient leans: N/A  Psychiatric Specialty Exam: Physical Exam  Nursing note and vitals reviewed. Constitutional: He appears well-developed and well-nourished.  HENT:  Head: Normocephalic and atraumatic.  Neck: Normal range of motion.  Respiratory: Effort normal.  Neurological: He is alert.  Only oriented to self.   Psychiatric: He has a normal mood and affect. His speech is normal and behavior is normal. Thought content normal. Cognition and memory are impaired. He expresses impulsivity.    Review of Systems   Psychiatric/Behavioral: Positive for memory loss. Negative for depression, hallucinations, substance abuse and suicidal ideas. The patient is not nervous/anxious and does not have insomnia.   All other systems reviewed and are negative.   Blood pressure (!) 97/53, pulse 94, temperature 98 F (36.7 C), temperature source Oral, resp. rate 18, height 6' (1.829 m), weight 76.8 kg (169 lb 5 oz), SpO2 97 %.Body mass index is 22.96 kg/m.  General Appearance: Fairly Groomed, elderly, Caucasian male who is wearing a hospital gown. NAD.   Eye Contact:  Fair  Speech:  Clear and Coherent and Normal Rate  Volume:  Normal  Mood:  Euthymic  Affect:  Constricted  Thought Process:  Goal Directed, Linear and Descriptions of Associations: Intact  Orientation:  Other:  Oriented to self.  Thought Content: Mostly Logical but illogical at times when discussing medical condition.   Suicidal Thoughts:  No  Homicidal Thoughts:  No  Memory:  Immediate;   Poor Recent;   Fair Remote;   Fair  Judgement:  Impaired  Insight:  Lacking  Psychomotor Activity:  Decreased  Concentration:  Concentration: Fair and Attention Span: Fair  Recall:  [employer] of Knowledge:  Poor  Language:  Fair  Akathisia:  No  Handed:  Right  AIMS (if indicated):   N/A  Assets:  Housing Social Support  ADL's:  Impaired  Cognition:  Impaired due to AMS.   Sleep:   Poor   Assessment: James David is a 82 y.o. male who was admitted with bilateral foot ulcers complicated by osteomyelitis of right lateral malleolus. Psychiatry was consulted for capacity evaluation to refuse surgery related to medical condition. Patient does not demonstrate capacity to refuse surgery due to altered mental status. He was unaware of his current medical condition and therefore cannot appreciate his condition. He is unable to identify the risks of declining surgical interventions. He will need an authorized representative to help him make these decisions. His  wife was present and agrees to help with these decisions.   Treatment Plan Summary: -Patient does not demonstrate capacity to refuse surgery related to current medical condition. His wife was present at bedside and agrees to help him make these decisions. -Psychiatry will sign off on patient at this time. Please consult psychiatry again as needed.  Disposition: No evidence of imminent risk to self or others at present.   Patient does not meet criteria for psychiatric inpatient admission.  Faythe Dingwall, DO 11/12/2017 12:17  PM

## 2017-11-12 NOTE — Progress Notes (Signed)
Pharmacy Antibiotic Note  James David is a 82 y.o. male admitted on 11/04/2017 with osteomyelitis.  Pharmacy has been consulted to change cefepime to ceftazidime.  Plan: Fortaz 2g IV after each HD.  Height: 6' (182.9 cm) Weight: 172 lb 2.9 oz (78.1 kg) IBW/kg (Calculated) : 77.6  Temp (24hrs), Avg:97.9 F (36.6 C), Min:97.8 F (36.6 C), Max:98 F (36.7 C)  Recent Labs  Lab 11/06/17 0312 11/06/17 1812 11/07/17 0744 11/10/17 0900  WBC 10.8* 11.3* 10.4 12.4*  CREATININE 6.98* 3.25* 4.69* 6.96*    Estimated Creatinine Clearance: 9.4 mL/min (A) (by C-G formula based on SCr of 6.96 mg/dL (H)).    Allergies  Allergen Reactions  . Amlodipine Besylate Swelling    Angioedema   . Lisinopril Other (See Comments)    Renal insufficiency  . Statins Other (See Comments)    Muscle aches  . Actos [Pioglitazone]     Chest pain  . Ropinirole Other (See Comments)    Causes excessive grogginess the following morning after taking  . Tape Other (See Comments)    SKIN IS THIN AND TAPE WILL ACTUALLY TEAR IT; PLEASE USE COBAN WRAP INSTEAD!!    Antimicrobials this admission: Vanc 2/19 >> 2/19; 2/20> 2/23 Cefepime 2/19 >> 2/27 Flagyl 2/19 x 1 Zyvox IV 2/20 x 1 Tressie Ellis 2/27 >>   Microbiology results: 2/22 MRSA PCR: negative  Thank you for allowing pharmacy to be a part of this patient's care.  Wynona Neat, PharmD, BCPS  11/12/2017 7:14 AM

## 2017-11-12 NOTE — Progress Notes (Signed)
Contacted [employer] rep, Adacia to make aware of dc home today with HH. Pt did receive [employer]. Jonnie Finner RN CCM Case Mgmt phone [phone]

## 2017-11-12 NOTE — Progress Notes (Signed)
Physical Therapy Treatment Patient Details Name: James David MRN: [id number] DOB: [birth year] Today's Date: 11/12/2017    History of Present Illness Pt is a 82 y/o male male with history of myasthenia gravis on prednisone, diabetes mellitus, ESRD on hemodialysis on Monday Wednesday and Friday, atrial fibrillation, anemia, chronic lower extremity wounds who had recent arteriogram done by Dr. Oneida Alar presents to the ER with complaints of increasing weakness since morning.  Patient states that he was feeling generally weak. Patient also has benign chronic lower extremity wounds with ulceration. Vascular was consulted and recommended bilateral LE amputations.    PT Comments    Pt just returned from HD and reports he is tired however is willing to sit EoB with therapy today. Pt requires modAx2 for bed mobility and modA progressing to min guard for sitting EoB. Pt able to sit EoB for 6 minutes before requesting to lay back down. Pt able to perform bilateral Long Arc quads in limited range due to weakness and perform reaching exercises to challenge balance. PT discussed current level of mobility and continued recommendation from therapy for SNF level care. PT highlighted the difficulties he and his wife will have if he goes home however he continues to state he is going home. PT will continue to follow acutely until d/c.     Follow Up Recommendations  SNF;Supervision/Assistance - 24 hour;Other (comment)(pt's spouse really wants him to return home)     Equipment Recommendations  Other (comment)(mechanical lift)    Recommendations for Other Services Other (comment)(Palliative Consult)     Precautions / Restrictions Precautions Precautions: Fall Restrictions Weight Bearing Restrictions: No    Balance   Sitting-balance support: Feet supported;Bilateral upper extremity supported Sitting balance-Leahy Scale: Poor Sitting balance - Comments: pt able to sit EoB for 6 minutes before fatiguing,  initially pt requires modA to maintain balance, able to progress to min guard and perform reaching cross body within his base of support, Pt has L lateral lean and requires repeated cuing to bring weight back into R hip                                    Cognition Arousal/Alertness: Awake/alert Behavior During Therapy: WFL for tasks assessed/performed Overall Cognitive Status: Impaired/Different from baseline Area of Impairment: Safety/judgement;Following commands                       Following Commands: Follows one step commands inconsistently;Follows one step commands with increased time Safety/Judgement: Decreased awareness of deficits;Decreased awareness of safety     General Comments: Decr insight into functional status and ability to manage at home      Exercises General Exercises - Lower Extremity Long Arc Quad: AROM;Both;5 reps;Seated Other Exercises Other Exercises: Reaching cross body x5 progressing to edges of BoS    General Comments General comments (skin integrity, edema, etc.): Pt wife in room and states that she feels she will be able to help him when he gets home      Pertinent Vitals/Pain Pain Assessment: Faces Faces Pain Scale: Hurts even more Pain Location: bil LEs, other than that, unspecified Pain Descriptors / Indicators: Aching;Grimacing Pain Intervention(s): Limited activity within patient's tolerance;Monitored during session           PT Goals (current goals can now be found in the care plan section) Acute Rehab PT Goals Patient Stated Goal: return home PT Goal Formulation: With patient/family  Time For Goal Achievement: 11/19/17 Potential to Achieve Goals: Fair Progress towards PT goals: Progressing toward goals    Frequency    Min 2X/week      PT Plan Current plan remains appropriate       AM-PAC PT "6 Clicks" Daily Activity  Outcome Measure  Difficulty turning over in bed (including adjusting bedclothes,  sheets and blankets)?: Unable Difficulty moving from lying on back to sitting on the side of the bed? : Unable Difficulty sitting down on and standing up from a chair with arms (e.g., wheelchair, bedside commode, etc,.)?: Unable Help needed moving to and from a bed to chair (including a wheelchair)?: Total Help needed walking in hospital room?: Total Help needed climbing 3-5 steps with a railing? : Total 6 Click Score: 6    End of Session Equipment Utilized During Treatment: Other (comment)(bed pad) Activity Tolerance: Patient limited by fatigue Patient left: in bed;with call bell/phone within reach;with bed alarm set;with family/visitor present Nurse Communication: Mobility status;Need for lift equipment PT Visit Diagnosis: Muscle weakness (generalized) (M62.81);Other abnormalities of gait and mobility (R26.89)     Time: 3532-9924 PT Time Calculation (min) (ACUTE ONLY): 13 min  Charges:  $Therapeutic Exercise: 8-22 mins                    G Codes:       Santiana Glidden B. Migdalia Dk PT, DPT Acute Rehabilitation  [phone] Pager [phone]     [place] 11/12/2017, 2:29 PM

## 2017-11-12 NOTE — Consult Note (Signed)
Patient is currently at IP HD.  Appears by note review that he has decided against amputation although goals remain full scope care including continued HD and full code status.  Would recommend community based palliative care follow along with [facility] services to further develop the conversation of goals of care as his vascular situation worsens.  Patient have completed a MOST form prior to this admission.  Community palliative services should continue to discuss patient desires moving forward regarding scope of treatment and hospital re-admission.    Outpatient Nephrology, [facility] services and community palliative team should coordinate care and share discussions to ensure full adherence to goals of care.  Kizzie Fantasia, MSN, RN-BC, [place]

## 2017-11-12 NOTE — Progress Notes (Signed)
Patient will DC to: Home Anticipated DC date: 11/12/17 Family notified: Spouse Transport by: Corey Burhan   Per MD patient ready for DC to Home. RN, patient, and patient's family notified of DC. DC packet on chart. Ambulance transport requested for patient.   CSW signing off.  Cedric Fishman, LCSW Clinical Social Worker [phone]

## 2017-11-12 NOTE — Progress Notes (Signed)
RT went to patients room for patients NIF. Patient is out of room and not available. RT will attempt again later.

## 2017-11-12 NOTE — Procedures (Signed)
Patient seen on Hemodialysis. QB 400, UF goal 3L Treatment adjusted as needed.  Elmarie Shiley MD [facility]. Office # [phone] Pager # [phone] 10:10 AM

## 2017-11-12 NOTE — Plan of Care (Signed)
Reviewed trmt orders and plan for HD today.

## 2017-11-12 NOTE — Discharge Summary (Signed)
PATIENT DETAILS Name: James David Age: 82 y.o. Sex: male Date of Birth: 31-Jul-1936 MRN: [id number]. Admitting Physician: Rise Patience, MD YKD:XIPJ, Dwyane Luo, MD  Admit Date: 11/04/2017 Discharge date: 11/12/2017  Recommendations for Outpatient Follow-up:  1. Follow up with PCP in 1-2 weeks 2. For follow-up with hemodialysis/nephrologist previously scheduled 3. Continue with IV Tressie Ellis for 6 weeks while on hemodialysis, continue oral doxycycline for 6-may require 4. Obtain CBC, chemistries weekly while on antimicrobial therapy.  Admitted From:  Home  Disposition: Home with home health services (refused SNF)   Home Health: Yes  Equipment/Devices: None  Discharge Condition: Guarded  CODE STATUS: FULL CODE  Diet recommendation:  Heart Healthy / Carb Modified  Brief Summary: See H&P, Labs, Consult and Test reports for all details in brief, 82 year old man PMH myasthenia gravis on prednisone, diabetes mellitus, end-stage renal disease on hemodialysis, atrial fibrillation, chronic lower extremity wounds bilaterally with no revascularization options, presented with increasing weakness.  Admitted for further evaluation of generalized weakness, suspected lower extremity wound infection.  Seen by neurology given concern for myasthenia crisis, impression was generalized weakness not secondary to myasthenia, recommendations were to continue prednisone, limit gabapentin and narcotics and avoid vancomycin.  Patient was seen by vascular surgery who recommended bilateral above-the-knee amputations-however patient refused. Subsequently seen by psychiatry who thought that the patient did not have capacity to make his decision.  After d/w patient's spouse-she also does not desire to pursue ampuation-she understands overall poor prognoses-and wants to take the patient home with home health services. See below for further details.   Brief Hospital Course: Progressive weakness.  Patient had cervical, lumbar and thoracic myelogram performed September 11, 2017 which showed no significant central canal stenosis to explain generalized weakness or gait instability.After d/w with Neuro-Dr Rory Percy by Dr Lytle But[place] was felt that weakness was not secondary to Myastenia crisis-recent imaging showed no structural etiology. Current suspicion is for progressive weakness of multifactorial etiology-including general decline in health.  Unfortunately outside of therapy, no further recommendations.Patient and spouse refused SNF-plans are to d/c home with home health services.   Cellulitis right lateral malleolus in setting of peripheral vascular disease, no revascularization options.  X-ray suggested the possibility of osteomyelitis.  Vascular surgery has recommended bilateral AKA which the patient has declined at this point. Seen by psychiatry on 2/27-patient with no capacity-however patient spouse who is the next decision maker continues to support patients decision of not wanting to pursue amputation/surgery.  She continues to refuse SNF placement as well-plans are for discharge home with antimicrobial therapy.Plan to discharge on oral doxycycline for 6 weeks, perhaps indefinitely.  IV ceftazidime with hemodialysis for 6 weeks.  Left heel, left great toe, right anterior foot, heel, outer foot, outer ankle wounds present on admission: continue wound care with [facility]  ESRD: Followed closely by nephrology during this hospital stay-unfortunately patient has been more confused-and somewhat uncooperative with hemodialysis-however he is easily redirectable.  As noted on my prior note-nephrology (Dr. Mariel Sleet that if patient is unable to cooperate with hemodialysis in the outpatient setting-apart from initiation of hospice care-he sees no other alternative.  This was discussed by this MD with the patient's spouse at bedside on 2/27.  She is aware of poor prognosis-and that he may be facing a situation  hospice care at some future.  Diabetes mellitus type 2: CBGs stable-resume oral hypoglycemic agents on discharge  Atrial fibrillation: Rate controlled with beta-blocker-resume warfarin starting 2/28.  Follow INR closely  Anemia of chronic kidney disease:  Stable for outpatient follow-up  Myasthenia gravis: continue prednisone per neurology   PMH pacemaker, stroke 01/2017  Ethics/palliative care: Unfortunate 82 year old numerous medical comorbidities as outlined above-ideally needs bilateral above-the-knee amputations due to critical limb ischemia-with no options for revascularization-seen by vascular surgery/nephrology/palliative care/physical therapy services during this hospital stay-patient continues to refuse surgery-although seen by psychiatry and found not to have capacity-his spouse does not wish to pursue surgery as well.  Very difficult situation, family has refused SNF-plans are to discharge home with maximal home health services.  Family/spouse is aware that given patient's overall decline in health-fluctuating mental status-that it may not be possible to hemodialysis in the outpatient setting, and that patient may need to be transitioned to hospice care in the near future.  Remains a full code for now.  Spouse fully aware of very poor overall prognosis  Procedures/Studies: None  Discharge Diagnoses:  Principal Problem:   Evaluation by psychiatric service required Active Problems:   Essential hypertension   Diabetes mellitus due to underlying condition with diabetic nephropathy ([place])   Pacemaker   ESRD (end stage renal disease) on dialysis ([place])   Atrial fibrillation, permanent (HCC)   Myasthenia gravis ([place])   Benign essential HTN   Weakness   Goals of care, counseling/discussion   Cellulitis of right lower extremity   Generalized weakness   Palliative care by specialist   Discharge Instructions:  Activity:  As tolerated with Full fall precautions use walker/cane  & assistance as needed   Discharge Instructions    Call MD for:  redness, tenderness, or signs of infection (pain, swelling, redness, odor or green/yellow discharge around incision site)   Complete by:  As directed    Call MD for:  severe uncontrolled pain   Complete by:  As directed    Diet - low sodium heart healthy   Complete by:  As directed    Increase activity slowly   Complete by:  As directed      Allergies as of 11/12/2017      Reactions   Amlodipine Besylate Swelling   Angioedema   Lisinopril Other (See Comments)   Renal insufficiency   Statins Other (See Comments)   Muscle aches   Actos [pioglitazone]    Chest pain   Ropinirole Other (See Comments)   Causes excessive grogginess the following morning after taking   Tape Other (See Comments)   SKIN IS THIN AND TAPE WILL ACTUALLY TEAR IT; PLEASE USE COBAN WRAP INSTEAD!!      Medication List    STOP taking these medications   amoxicillin-clavulanate 875-125 MG tablet Commonly known as:  AUGMENTIN   ceFEPIme 2 g in dextrose 5 % 50 mL   gabapentin 100 MG capsule Commonly known as:  NEURONTIN     TAKE these medications   acetaminophen 500 MG tablet Commonly known as:  TYLENOL Take 500-1,000 mg by mouth daily as needed for moderate pain or headache.   ARTIFICIAL TEAR OP Place 1 drop into both eyes daily.   aspirin EC 81 MG tablet Take 81 mg by mouth at bedtime.   calcium acetate 667 MG capsule Commonly known as:  PHOSLO Take 667-1,334 mg by mouth See admin instructions. Take 1334 mg by mouth three times daily with meals, take 1 capsule (667 mg) with snacks   carvedilol 25 MG tablet Commonly known as:  COREG Take 25 mg by mouth 2 (two) times daily.   cefTAZidime 2 g in sodium chloride 0.9 % 100 mL Inject 2 g  into the vein every Monday, Wednesday, and Friday with hemodialysis.   collagenase ointment Commonly known as:  SANTYL Apply daily topically.   doxycycline 100 MG tablet Commonly known as:   VIBRA-TABS Take 1 tablet (100 mg total) by mouth every 12 (twelve) hours.   ezetimibe 10 MG tablet Commonly known as:  ZETIA Take 1 tablet (10 mg total) by mouth daily.   ENSURE Take 1 Can by mouth every other day. On non dialysis days What changed:  Another medication with the same name was changed. Make sure you understand how and when to take each.   feeding supplement (NEPRO CARB STEADY) Liqd Take 237 mLs by mouth 2 (two) times daily between meals. What changed:    when to take this  additional instructions   fenofibrate 160 MG tablet Take 160 mg by mouth daily.   finasteride 5 MG tablet Commonly known as:  PROSCAR Take 1 tablet (5 mg total) by mouth daily.   folic acid-vitamin b complex-vitamin c-selenium-zinc 3 MG Tabs tablet Take 1 tablet at bedtime by mouth.   glimepiride 1 MG tablet Commonly known as:  AMARYL Take 1 tablet (1 mg total) by mouth daily with breakfast. What changed:    how much to take  additional instructions   HYDROcodone-acetaminophen 5-325 MG tablet Commonly known as:  NORCO/VICODIN Take 1-2 tablets by mouth every 4 (four) hours as needed for moderate pain or severe pain.   lanthanum 1000 MG chewable tablet Commonly known as:  FOSRENOL Chew 1,000 mg by mouth 3 (three) times daily with meals.   Melatonin 3 MG Tabs Take 3 mg at bedtime by mouth.   povidone-iodine 10 % ointment Commonly known as:  BETADINE Apply 1 application topically 3 (three) times a week.   predniSONE 20 MG tablet Commonly known as:  DELTASONE Take 1 tablet (20 mg total) by mouth daily with breakfast. Please note to continue taking 20 mg tablet for now  and you must be evaluated by your neurologist (Dr. [name]) to determine next Prednisone dose and frequency.   traMADol 50 MG tablet Commonly known as:  ULTRAM Take 50-100 mg by mouth 2 (two) times daily as needed for moderate pain.   warfarin 1 MG tablet Commonly known as:  COUMADIN 1 1/2 tab daily Start taking  on:  11/13/2017 What changed:    how much to take  how to take this  when to take this  additional instructions            Durable Medical Equipment  (From admission, onward)        Start     Ordered   11/10/17 1522  For home use only DME Other see comment  Once    Comments:  [employer]CH[employer]ER [employer]   11/10/17 1522     Follow-up Information    [facility] Follow up.   Specialty:  Home Health Services Why:  [place] information: [address] [place] [id number] [phone]        [facility] - Dme Follow up.   Why:  [employer] information: [place] [id number] [phone]          Allergies  Allergen Reactions  . Amlodipine Besylate Swelling    Angioedema   . Lisinopril Other (See Comments)    Renal insufficiency  . Statins Other (See Comments)    Muscle aches  . Actos [Pioglitazone]  Chest pain  . Ropinirole Other (See Comments)    Causes excessive grogginess the following morning after taking  . Tape Other (See Comments)    SKIN IS THIN AND TAPE WILL ACTUALLY TEAR IT; PLEASE USE COBAN WRAP INSTEAD!!    Consultations:  Neurology  VVS  Nephrology   PMT   Other Procedures/Studies: Dg Chest 2 View  Result Date: 11/04/2017 CLINICAL DATA:  Fatigue, weakness, history of myasthenia gravis EXAM: CHEST  2 VIEW COMPARISON:  07/27/2017 FINDINGS: Surgical clips over the left chest. Left-sided pacing device, similar compared to previous exam. No significant pleural effusion. No acute consolidation. Stable enlarged cardiomediastinal silhouette with aortic atherosclerosis. No pneumothorax. Vascular stent in the right axillary region. Flowing osteophytes of the spine. IMPRESSION: No active cardiopulmonary disease.  Cardiomegaly. Electronically Signed   By: Donavan Foil M.D.   On: 11/04/2017 18:13   Dg Ankle 2 Views Left  Result Date: 11/04/2017 CLINICAL  DATA:  Ankle pain EXAM: LEFT ANKLE - 2 VIEW COMPARISON:  None. FINDINGS: Only a single lateral view of the ankle is submitted. According to the notes, patient unable to hold position for AP view. Large plantar calcaneal spur and enthesophytes. Vascular calcification. No obvious acute abnormality IMPRESSION: 1. Limited single lateral view shows no acute abnormality 2. Large plantar calcaneal spur. Electronically Signed   By: Donavan Foil M.D.   On: 11/04/2017 18:42   Dg Ankle Complete Right  Result Date: 11/04/2017 CLINICAL DATA:  Worsening foot wounds EXAM: RIGHT ANKLE - COMPLETE 3+ VIEW COMPARISON:  06/28/2016 FINDINGS: No acute displaced fracture or malalignment is seen. Possible small erosion at the fibular malleolar tip on one view only. There is lucency and thinning of the soft tissues over the fibular malleolus suspected to represent an ulcer. Mild degenerative changes of the medial ankle joint. Vascular calcifications. Large calcaneal spur and enthesophyte. Soft tissue thickening posterior calcaneal region. No soft tissue gas IMPRESSION: 1. Questionable small cortical erosion/focus of bone infection at the lateral fibular malleolar region on one view; there is thinning of the soft tissues and a lucency overlying the fibular malleolus, presumably representing an ulcer or wound. 2. Large plantar calcaneal spurs. Electronically Signed   By: Donavan Foil M.D.   On: 11/04/2017 18:19   Dg Foot Complete Left  Result Date: 11/04/2017 CLINICAL DATA:  Bilateral foot wounds right and left heel EXAM: LEFT FOOT - COMPLETE 3+ VIEW COMPARISON:  None. FINDINGS: No acute fracture or malalignment. Moderate arthritis at the first MTP joint. Screw fixation of the head of fifth metatarsal with chronic deformity noted. Vascular calcifications. Large calcaneal spurs and enthesophyte. No soft tissue gas. No radiopaque foreign body IMPRESSION: 1. No acute osseous abnormality 2. Postsurgical changes of the distal fifth  metatarsal with evidence of old fracture deformity 3. Large calcaneal spurs Electronically Signed   By: Donavan Foil M.D.   On: 11/04/2017 18:15   Dg Foot Complete Right  Result Date: 11/04/2017 CLINICAL DATA:  Foot wounds EXAM: RIGHT FOOT COMPLETE - 3+ VIEW COMPARISON:  None. FINDINGS: No acute displaced fracture or malalignment is seen. Extensive vascular calcifications. Possible old fracture deformity involving the proximal shaft and base of the third metatarsal. Large plantar calcaneal spur. No soft tissue gas. IMPRESSION: 1. No acute osseous abnormality 2. Large plantar calcaneal spur Electronically Signed   By: Donavan Foil M.D.   On: 11/04/2017 18:23     TODAY-DAY OF DISCHARGE:  Subjective:   Gwenyth Ober today was lying comfortably in bed-he had just returned  from hemodialysis.  All he was asking was to be discharged home.  His spouse was at  bedside.  Objective:   Blood pressure 127/70, pulse 89, temperature 98.8 F (37.1 C), temperature source Oral, resp. rate 18, height 6' (1.829 m), weight 73.8 kg (162 lb 11.2 oz), SpO2 100 %.  Intake/Output Summary (Last 24 hours) at 11/12/2017 1617 Last data filed at 11/12/2017 1242 Gross per 24 hour  Intake 100 ml  Output 2463 ml  Net -2363 ml   Filed Weights   11/12/17 0527 11/12/17 0745 11/12/17 1300  Weight: 78.1 kg (172 lb 2.9 oz) 76.8 kg (169 lb 5 oz) 73.8 kg (162 lb 11.2 oz)    Exam: Awake Alert-somewhat confused,No new F.N deficits, [place].AT,PERRAL Supple Neck,No JVD, No cervical lymphadenopathy appriciated.  Symmetrical Chest wall movement, Good air movement bilaterally, CTAB RRR,No Gallops,Rubs or new Murmurs, No Parasternal Heave +ve B.Sounds, Abd Soft, Non tender, No organomegaly appriciated, No rebound -guarding or rigidity. No Cyanosis, Clubbing or edema, No new Rash or bruise   PERTINENT RADIOLOGIC STUDIES: Dg Chest 2 View  Result Date: 11/04/2017 CLINICAL DATA:  Fatigue, weakness, history of myasthenia gravis  EXAM: CHEST  2 VIEW COMPARISON:  07/27/2017 FINDINGS: Surgical clips over the left chest. Left-sided pacing device, similar compared to previous exam. No significant pleural effusion. No acute consolidation. Stable enlarged cardiomediastinal silhouette with aortic atherosclerosis. No pneumothorax. Vascular stent in the right axillary region. Flowing osteophytes of the spine. IMPRESSION: No active cardiopulmonary disease.  Cardiomegaly. Electronically Signed   By: Donavan Foil M.D.   On: 11/04/2017 18:13   Dg Ankle 2 Views Left  Result Date: 11/04/2017 CLINICAL DATA:  Ankle pain EXAM: LEFT ANKLE - 2 VIEW COMPARISON:  None. FINDINGS: Only a single lateral view of the ankle is submitted. According to the notes, patient unable to hold position for AP view. Large plantar calcaneal spur and enthesophytes. Vascular calcification. No obvious acute abnormality IMPRESSION: 1. Limited single lateral view shows no acute abnormality 2. Large plantar calcaneal spur. Electronically Signed   By: Donavan Foil M.D.   On: 11/04/2017 18:42   Dg Ankle Complete Right  Result Date: 11/04/2017 CLINICAL DATA:  Worsening foot wounds EXAM: RIGHT ANKLE - COMPLETE 3+ VIEW COMPARISON:  06/28/2016 FINDINGS: No acute displaced fracture or malalignment is seen. Possible small erosion at the fibular malleolar tip on one view only. There is lucency and thinning of the soft tissues over the fibular malleolus suspected to represent an ulcer. Mild degenerative changes of the medial ankle joint. Vascular calcifications. Large calcaneal spur and enthesophyte. Soft tissue thickening posterior calcaneal region. No soft tissue gas IMPRESSION: 1. Questionable small cortical erosion/focus of bone infection at the lateral fibular malleolar region on one view; there is thinning of the soft tissues and a lucency overlying the fibular malleolus, presumably representing an ulcer or wound. 2. Large plantar calcaneal spurs. Electronically Signed   By: Donavan Foil M.D.   On: 11/04/2017 18:19   Dg Foot Complete Left  Result Date: 11/04/2017 CLINICAL DATA:  Bilateral foot wounds right and left heel EXAM: LEFT FOOT - COMPLETE 3+ VIEW COMPARISON:  None. FINDINGS: No acute fracture or malalignment. Moderate arthritis at the first MTP joint. Screw fixation of the head of fifth metatarsal with chronic deformity noted. Vascular calcifications. Large calcaneal spurs and enthesophyte. No soft tissue gas. No radiopaque foreign body IMPRESSION: 1. No acute osseous abnormality 2. Postsurgical changes of the distal fifth metatarsal with evidence of old fracture deformity 3. Large  calcaneal spurs Electronically Signed   By: Donavan Foil M.D.   On: 11/04/2017 18:15   Dg Foot Complete Right  Result Date: 11/04/2017 CLINICAL DATA:  Foot wounds EXAM: RIGHT FOOT COMPLETE - 3+ VIEW COMPARISON:  None. FINDINGS: No acute displaced fracture or malalignment is seen. Extensive vascular calcifications. Possible old fracture deformity involving the proximal shaft and base of the third metatarsal. Large plantar calcaneal spur. No soft tissue gas. IMPRESSION: 1. No acute osseous abnormality 2. Large plantar calcaneal spur Electronically Signed   By: Donavan Foil M.D.   On: 11/04/2017 18:23     PERTINENT LAB RESULTS: CBC: Recent Labs    11/10/17 0900 11/12/17 0825  WBC 12.4* 11.6*  HGB 8.7* 8.8*  HCT 29.0* 28.4*  PLT 408* 377   CMET CMP     Component Value Date/Time   NA 134 (L) 11/12/2017 0820   NA 140 02/28/2017 0916   NA 138 10/17/2016 1303   K 4.0 11/12/2017 0820   K 5.1 10/17/2016 1303   CL 97 (L) 11/12/2017 0820   CO2 22 11/12/2017 0820   CO2 18 (L) 10/17/2016 1303   GLUCOSE 94 11/12/2017 0820   GLUCOSE 197 (H) 10/17/2016 1303   BUN 48 (H) 11/12/2017 0820   BUN 46 (H) 02/28/2017 0916   BUN 71.0 (H) 10/17/2016 1303   CREATININE 5.59 (H) 11/12/2017 0820   CREATININE 5.9 (HH) 10/17/2016 1303   CALCIUM 9.1 11/12/2017 0820   CALCIUM 8.9 10/17/2016 1303    PROT 6.7 11/04/2017 1500   PROT 6.2 09/23/2017 1513   PROT 6.2 (L) 10/17/2016 1303   ALBUMIN 2.1 (L) 11/12/2017 0820   ALBUMIN 3.2 (L) 02/28/2017 0916   ALBUMIN 2.9 (L) 10/17/2016 1303   AST 22 11/04/2017 1500   AST 8 10/17/2016 1303   ALT 23 11/04/2017 1500   ALT 13 10/17/2016 1303   ALKPHOS 49 11/04/2017 1500   ALKPHOS 95 10/17/2016 1303   BILITOT 0.7 11/04/2017 1500   BILITOT 0.2 02/28/2017 0916   BILITOT 0.24 10/17/2016 1303   GFRNONAA 9 (L) 11/12/2017 0820   GFRAA 10 (L) 11/12/2017 0820    GFR Estimated Creatinine Clearance: 11.2 mL/min (A) (by C-G formula based on SCr of 5.59 mg/dL (H)). No results for input(s): LIPASE, AMYLASE in the last 72 hours. No results for input(s): CKTOTAL, CKMB, CKMBINDEX, TROPONINI in the last 72 hours. Invalid input(s): POCBNP No results for input(s): DDIMER in the last 72 hours. No results for input(s): HGBA1C in the last 72 hours. No results for input(s): CHOL, HDL, LDLCALC, TRIG, CHOLHDL, LDLDIRECT in the last 72 hours. No results for input(s): TSH, T4TOTAL, T3FREE, THYROIDAB in the last 72 hours.  Invalid input(s): FREET3 No results for input(s): VITAMINB12, FOLATE, FERRITIN, TIBC, IRON, RETICCTPCT in the last 72 hours. Coags: Recent Labs    11/11/17 0704 11/12/17 0418  INR 2.56 3.38   Microbiology: Recent Results (from the past 240 hour(s))  MRSA PCR Screening     Status: None   Collection Time: 11/07/17  3:44 AM  Result Value Ref Range Status   MRSA by PCR NEGATIVE NEGATIVE Final    Comment:        The GeneXpert MRSA Assay (FDA approved for NASAL specimens only), is one component of a comprehensive MRSA colonization surveillance program. It is not intended to diagnose MRSA infection nor to guide or monitor treatment for MRSA infections. Performed at [facility], [place] [address], [place],  [address]     FURTHER  DISCHARGE INSTRUCTIONS:  Get Medicines reviewed and adjusted: Please take all your  medications with you for your next visit with your Primary MD  Laboratory/radiological data: Please request your Primary MD to go over all hospital tests and procedure/radiological results at the follow up, please ask your Primary MD to get all Hospital records sent to his/her office.  In some cases, they will be blood work, cultures and biopsy results pending at the time of your discharge. Please request that your primary care M.D. goes through all the records of your hospital data and follows up on these results.  Also Note the following: If you experience worsening of your admission symptoms, develop shortness of breath, life threatening emergency, suicidal or homicidal thoughts you must seek medical attention immediately by calling 911 or calling your MD immediately  if symptoms less severe.  You must read complete instructions/literature along with all the possible adverse reactions/side effects for all the Medicines you take and that have been prescribed to you. Take any new Medicines after you have completely understood and accpet all the possible adverse reactions/side effects.   Do not drive when taking Pain medications or sleeping medications (Benzodaizepines)  Do not take more than prescribed Pain, Sleep and Anxiety Medications. It is not advisable to combine anxiety,sleep and pain medications without talking with your primary care practitioner  Special Instructions: If you have smoked or chewed Tobacco  in the last 2 yrs please stop smoking, stop any regular Alcohol  and or any Recreational drug use.  Wear Seat belts while driving.  Please note: You were cared for by a hospitalist during your hospital stay. Once you are discharged, your primary care physician will handle any further medical issues. Please note that NO REFILLS for any discharge medications will be authorized once you are discharged, as it is imperative that you return to your primary care physician (or establish a  relationship with a primary care physician if you do not have one) for your post hospital discharge needs so that they can reassess your need for medications and monitor your lab values.  Total Time spent coordinating discharge including counseling, education and face to face time equals  45 minutes.  SignedOren Binet 11/12/2017 4:17 PM

## 2017-11-12 NOTE — Progress Notes (Signed)
James David to be D/C'd home per MD order. Discussed with the patient and wife and all questions fully answered.  VVS, Skin clean and dry. An After Visit Summary was printed and given to the patient's wife.  Patient escorted via stretcher, and D/C home via [facility].  [name]  11/12/2017 6:03 PM

## 2017-11-12 NOTE — Progress Notes (Signed)
Patient ID: James David, male   DOB: [birth year], 82 y.o.   MRN: [id number]  [facility] Progress Note   Assessment/ Plan:   1.  Bilateral foot ulcers/osteomyelitis of the right lateral malleolus: On antimicrobial coverage with intravenous cefepime, improving leukocytosis noted on labs today. He remains afebrile and without any constitutional symptoms suggestive of worsening infection. He has declined amputations.  Would highly recommend involving psychiatry at this point to see if he has the mental competence to make decisions for himself as palliative care appears to be the right course of action with his fluctuations in mentation and underlying osteomyelitis. 2. ESRD: Continue hemodialysis on M/W/F schedule-unfortunately, exceedingly poor overall prognosis with osteomyelitis for which he has refused amputation and declining myasthenia with what appears to be delirium versus sundowning while in the hospital. 3. Anemia: Low hemoglobin noted at 8.8, secondary to ESA resistance/malnutrition from ongoing infection/osteomyelitis.  Denies overt loss.  We will continue the current high-dose ESA and consider PRBCs when hemoglobin <8.0. 4. CKD-MBD: Phosphorus at goal on lanthanum, continue Hectorol for PTH suppression 5. Nutrition: With evidence of malnutrition, continue efforts at nutritional supplementation/infection control. 6. Hypertension: Blood pressure under fair control, continue to monitor with ultrafiltration and hemodialysis. 7.  Myasthenia gravis: Progressive and an additional source of his continued debilitation.  Subjective:   Patient trying to undress himself while in dialysis-denies any chest pain or shortness of breath.  States that he is trying to get comfortable.   Objective:   BP 112/62 (BP Location: Left Arm)   Pulse 91   Temp 98 F (36.7 C) (Oral)   Resp 18   Ht 6' (1.829 m)   Wt 76.8 kg (169 lb 5 oz)   SpO2 97%   BMI 22.96 kg/m   Physical Exam: Gen:  Appears comfortable resting in dialysis, pleasant but confused trying to undress himself CVS: Pulse regular rhythm, normal rate, S1 and S2 normal Resp: Clear to auscultation, no rales/rhonchi Abd: Soft, obese, nontender Ext: No lower extremity edema.  Right upper arm aVF cannulated  Labs: BMET Recent Labs  Lab 11/06/17 0312 11/06/17 1812 11/07/17 0744 11/10/17 0900 11/12/17 0820  NA 129* 133* 136 133* 134*  K 3.6 3.9 3.7 4.0 4.0  CL 91* 94* 98* 96* 97*  CO2 24 26 24  21* 22  GLUCOSE 170* 139* 180* 88 94  BUN 74* 22* 36* 73* 48*  CREATININE 6.98* 3.25* 4.69* 6.96* 5.59*  CALCIUM 9.1 8.5* 9.0 9.0 9.1  PHOS  --  1.7* 2.3* 3.7 4.8*   CBC Recent Labs  Lab 11/06/17 0312 11/06/17 1812 11/07/17 0744 11/10/17 0900 11/12/17 0825  WBC 10.8* 11.3* 10.4 12.4* 11.6*  NEUTROABS 8.9*  --  8.4*  --   --   HGB 8.7* 9.8* 8.8* 8.7* 8.8*  HCT 28.0* 31.8* 29.8* 29.0* 28.4*  MCV 102.6* 102.3* 103.8* 101.4* 101.4*  PLT 371 372 355 408* 377   Medications:    . aspirin  81 mg Oral Daily  . carvedilol  25 mg Oral BID  . collagenase   Topical Daily  . doxercalciferol  1 mcg Intravenous Q M,W,F-HD  . doxycycline  100 mg Oral Q12H  . ezetimibe  10 mg Oral Daily  . feeding supplement (NEPRO CARB STEADY)  237 mL Oral Q M,W,F  . fenofibrate  160 mg Oral Daily  . finasteride  5 mg Oral Daily  . folic acid-pyridoxine-cyancobalamin  1 tablet Oral QHS  . insulin aspart  0-9 Units Subcutaneous TID WC  .  insulin glargine  5 Units Subcutaneous QHS  . lanthanum  1,000 mg Oral TID WC  . Melatonin  3 mg Oral QHS  . predniSONE  20 mg Oral Q breakfast  . Warfarin - Pharmacist Dosing Inpatient   Does not apply [id number]   Elmarie Shiley, MD 11/12/2017, 10:05 AM

## 2017-11-12 NOTE — Progress Notes (Signed)
Late entry for 11/12/17 1300.  Patient very restless within 30 minutes of trmt initiation.  He consistently removed covers, extended right leg off of bed, partially removed gown.  Patient denied pain, discomfort when asked.  Mr. Gardella also consistently cupped his genitals.  He denied condom cath being too tight or uncomfortable.  Patient alert to self and general place only.  He frequently would bend/repostion his right arm, causing arterial needle pressures to increase.  He stated he did not remember that he should keep his access arm somewhat straight and still.  Discussed with Dr. Posey Pronto my concerns that patient may not be ready for outpatient dialysis, in that he may not tolerate dialysis in a chair, and lack of movement/insight needed for outpatient dialysis.  Today he required frequent 1:1 care to maintain access safety.  Suggested possibly next trmt in HD chair to evaluate tolerance.

## 2017-11-12 NOTE — Progress Notes (Signed)
Seen and examined-spouse at bedside. Earlier by psychiatry-patient does not have capacity to make his medical decisions. Spoke with wife at length-she does not wish to pursue any surgical intervention in regards to his wounds-and wants to take him home today.    I subsequently spoke with Dr. Patel-nephrology-patient has had issues staying straight/still with hemodialysis-patient apparently has been moving and somewhat uncooperative requiring frequent redirection.  Dr. Posey Pronto suspects that patient continues to do this in the outpatient setting- with his overall declining health-patient probably will require initiation of hospice care in the near future-as dialysis will not be able to be safely performed.    Dr. Posey Pronto is okay with discharge home-he is aware that patient will require IV antibiotics with dialysis.    Spoke at length with patient's spouse after speaking with nephrology-she is aware of the concern about not being cooperative with hemodialysis in the outpatient setting-and if that occurs-we may be looking at hospice-spouse understands, she has asked to see if we can help her with transportation home-I spoke to social work.   Overall prognosis is poor  Please see discharge summary for further details.

## 2017-11-12 NOTE — Progress Notes (Signed)
RT NOTE: NIF performed x3. NIF -35 achieved with good effort. Patient does require some coaching to understand and be able to perform.

## 2017-11-17 ENCOUNTER — Inpatient Hospital Stay (HOSPITAL_COMMUNITY)
Admission: EM | Admit: 2017-11-17 | Discharge: 2017-11-24 | DRG: 056 | Disposition: A | Discharge status: Hospice | Payer: Medicare Other | Attending: Nephrology | Admitting: Nephrology

## 2017-11-17 ENCOUNTER — Encounter (HOSPITAL_COMMUNITY): Payer: Self-pay | Admitting: Emergency Medicine

## 2017-11-17 ENCOUNTER — Emergency Department (HOSPITAL_COMMUNITY): Payer: Medicare Other

## 2017-11-17 ENCOUNTER — Inpatient Hospital Stay (HOSPITAL_COMMUNITY): Payer: Medicare Other

## 2017-11-17 ENCOUNTER — Other Ambulatory Visit: Payer: Self-pay

## 2017-11-17 DIAGNOSIS — Z961 Presence of intraocular lens: Secondary | ICD-10-CM | POA: Diagnosis present

## 2017-11-17 DIAGNOSIS — I69398 Other sequelae of cerebral infarction: Secondary | ICD-10-CM

## 2017-11-17 DIAGNOSIS — E1165 Type 2 diabetes mellitus with hyperglycemia: Secondary | ICD-10-CM | POA: Diagnosis present

## 2017-11-17 DIAGNOSIS — L97429 Non-pressure chronic ulcer of left heel and midfoot with unspecified severity: Secondary | ICD-10-CM | POA: Diagnosis present

## 2017-11-17 DIAGNOSIS — L97319 Non-pressure chronic ulcer of right ankle with unspecified severity: Secondary | ICD-10-CM | POA: Diagnosis present

## 2017-11-17 DIAGNOSIS — L89610 Pressure ulcer of right heel, unstageable: Secondary | ICD-10-CM | POA: Diagnosis present

## 2017-11-17 DIAGNOSIS — E1142 Type 2 diabetes mellitus with diabetic polyneuropathy: Secondary | ICD-10-CM | POA: Diagnosis present

## 2017-11-17 DIAGNOSIS — Z7952 Long term (current) use of systemic steroids: Secondary | ICD-10-CM

## 2017-11-17 DIAGNOSIS — L8990 Pressure ulcer of unspecified site, unspecified stage: Secondary | ICD-10-CM | POA: Diagnosis present

## 2017-11-17 DIAGNOSIS — Z85828 Personal history of other malignant neoplasm of skin: Secondary | ICD-10-CM

## 2017-11-17 DIAGNOSIS — M869 Osteomyelitis, unspecified: Secondary | ICD-10-CM | POA: Diagnosis present

## 2017-11-17 DIAGNOSIS — X58XXXA Exposure to other specified factors, initial encounter: Secondary | ICD-10-CM | POA: Diagnosis present

## 2017-11-17 DIAGNOSIS — I4821 Permanent atrial fibrillation: Secondary | ICD-10-CM | POA: Diagnosis present

## 2017-11-17 DIAGNOSIS — Z8249 Family history of ischemic heart disease and other diseases of the circulatory system: Secondary | ICD-10-CM

## 2017-11-17 DIAGNOSIS — Z7984 Long term (current) use of oral hypoglycemic drugs: Secondary | ICD-10-CM

## 2017-11-17 DIAGNOSIS — L97519 Non-pressure chronic ulcer of other part of right foot with unspecified severity: Secondary | ICD-10-CM | POA: Diagnosis present

## 2017-11-17 DIAGNOSIS — M898X9 Other specified disorders of bone, unspecified site: Secondary | ICD-10-CM | POA: Diagnosis present

## 2017-11-17 DIAGNOSIS — R791 Abnormal coagulation profile: Secondary | ICD-10-CM

## 2017-11-17 DIAGNOSIS — L89514 Pressure ulcer of right ankle, stage 4: Secondary | ICD-10-CM | POA: Diagnosis present

## 2017-11-17 DIAGNOSIS — L89620 Pressure ulcer of left heel, unstageable: Secondary | ICD-10-CM | POA: Diagnosis present

## 2017-11-17 DIAGNOSIS — R627 Adult failure to thrive: Secondary | ICD-10-CM | POA: Diagnosis present

## 2017-11-17 DIAGNOSIS — E11621 Type 2 diabetes mellitus with foot ulcer: Secondary | ICD-10-CM | POA: Diagnosis present

## 2017-11-17 DIAGNOSIS — E1122 Type 2 diabetes mellitus with diabetic chronic kidney disease: Secondary | ICD-10-CM | POA: Diagnosis present

## 2017-11-17 DIAGNOSIS — Z7901 Long term (current) use of anticoagulants: Secondary | ICD-10-CM

## 2017-11-17 DIAGNOSIS — Z79899 Other long term (current) drug therapy: Secondary | ICD-10-CM

## 2017-11-17 DIAGNOSIS — Z9841 Cataract extraction status, right eye: Secondary | ICD-10-CM

## 2017-11-17 DIAGNOSIS — I2510 Atherosclerotic heart disease of native coronary artery without angina pectoris: Secondary | ICD-10-CM | POA: Diagnosis present

## 2017-11-17 DIAGNOSIS — G7001 Myasthenia gravis with (acute) exacerbation: Principal | ICD-10-CM | POA: Diagnosis present

## 2017-11-17 DIAGNOSIS — I484 Atypical atrial flutter: Secondary | ICD-10-CM | POA: Diagnosis present

## 2017-11-17 DIAGNOSIS — Z91048 Other nonmedicinal substance allergy status: Secondary | ICD-10-CM

## 2017-11-17 DIAGNOSIS — I480 Paroxysmal atrial fibrillation: Secondary | ICD-10-CM | POA: Diagnosis present

## 2017-11-17 DIAGNOSIS — E0821 Diabetes mellitus due to underlying condition with diabetic nephropathy: Secondary | ICD-10-CM | POA: Diagnosis not present

## 2017-11-17 DIAGNOSIS — Z950 Presence of cardiac pacemaker: Secondary | ICD-10-CM

## 2017-11-17 DIAGNOSIS — R531 Weakness: Secondary | ICD-10-CM

## 2017-11-17 DIAGNOSIS — I132 Hypertensive heart and chronic kidney disease with heart failure and with stage 5 chronic kidney disease, or end stage renal disease: Secondary | ICD-10-CM | POA: Diagnosis present

## 2017-11-17 DIAGNOSIS — I5032 Chronic diastolic (congestive) heart failure: Secondary | ICD-10-CM | POA: Diagnosis present

## 2017-11-17 DIAGNOSIS — E11649 Type 2 diabetes mellitus with hypoglycemia without coma: Secondary | ICD-10-CM | POA: Diagnosis not present

## 2017-11-17 DIAGNOSIS — E11622 Type 2 diabetes mellitus with other skin ulcer: Secondary | ICD-10-CM | POA: Diagnosis present

## 2017-11-17 DIAGNOSIS — R1310 Dysphagia, unspecified: Secondary | ICD-10-CM | POA: Diagnosis not present

## 2017-11-17 DIAGNOSIS — Z888 Allergy status to other drugs, medicaments and biological substances status: Secondary | ICD-10-CM

## 2017-11-17 DIAGNOSIS — N186 End stage renal disease: Secondary | ICD-10-CM | POA: Diagnosis present

## 2017-11-17 DIAGNOSIS — L97329 Non-pressure chronic ulcer of left ankle with unspecified severity: Secondary | ICD-10-CM | POA: Diagnosis present

## 2017-11-17 DIAGNOSIS — E785 Hyperlipidemia, unspecified: Secondary | ICD-10-CM | POA: Diagnosis not present

## 2017-11-17 DIAGNOSIS — Z8673 Personal history of transient ischemic attack (TIA), and cerebral infarction without residual deficits: Secondary | ICD-10-CM

## 2017-11-17 DIAGNOSIS — F0390 Unspecified dementia without behavioral disturbance: Secondary | ICD-10-CM | POA: Diagnosis present

## 2017-11-17 DIAGNOSIS — L89890 Pressure ulcer of other site, unstageable: Secondary | ICD-10-CM | POA: Diagnosis present

## 2017-11-17 DIAGNOSIS — Z833 Family history of diabetes mellitus: Secondary | ICD-10-CM

## 2017-11-17 DIAGNOSIS — I482 Chronic atrial fibrillation: Secondary | ICD-10-CM | POA: Diagnosis present

## 2017-11-17 DIAGNOSIS — Z992 Dependence on renal dialysis: Secondary | ICD-10-CM

## 2017-11-17 DIAGNOSIS — L97419 Non-pressure chronic ulcer of right heel and midfoot with unspecified severity: Secondary | ICD-10-CM | POA: Diagnosis present

## 2017-11-17 DIAGNOSIS — Z6821 Body mass index (BMI) 21.0-21.9, adult: Secondary | ICD-10-CM | POA: Diagnosis not present

## 2017-11-17 DIAGNOSIS — Z9842 Cataract extraction status, left eye: Secondary | ICD-10-CM

## 2017-11-17 DIAGNOSIS — R269 Unspecified abnormalities of gait and mobility: Secondary | ICD-10-CM

## 2017-11-17 DIAGNOSIS — S51011A Laceration without foreign body of right elbow, initial encounter: Secondary | ICD-10-CM | POA: Diagnosis present

## 2017-11-17 DIAGNOSIS — Z7982 Long term (current) use of aspirin: Secondary | ICD-10-CM

## 2017-11-17 DIAGNOSIS — D631 Anemia in chronic kidney disease: Secondary | ICD-10-CM | POA: Diagnosis present

## 2017-11-17 DIAGNOSIS — E1169 Type 2 diabetes mellitus with other specified complication: Secondary | ICD-10-CM | POA: Diagnosis present

## 2017-11-17 DIAGNOSIS — Z515 Encounter for palliative care: Secondary | ICD-10-CM | POA: Diagnosis not present

## 2017-11-17 LAB — COMPREHENSIVE METABOLIC PANEL
ALT: 30 U/L (ref 17–63)
AST: 22 U/L (ref 15–41)
Albumin: 2.2 g/dL — ABNORMAL LOW (ref 3.5–5.0)
Alkaline Phosphatase: 50 U/L (ref 38–126)
Anion gap: 17 — ABNORMAL HIGH (ref 5–15)
BILIRUBIN TOTAL: 0.8 mg/dL (ref 0.3–1.2)
BUN: 83 mg/dL — AB (ref 6–20)
CO2: 24 mmol/L (ref 22–32)
CREATININE: 8.36 mg/dL — AB (ref 0.61–1.24)
Calcium: 9.3 mg/dL (ref 8.9–10.3)
Chloride: 91 mmol/L — ABNORMAL LOW (ref 101–111)
GFR calc Af Amer: 6 mL/min — ABNORMAL LOW (ref >60)
GFR, EST NON AFRICAN AMERICAN: 5 mL/min — AB (ref >60)
Glucose, Bld: 276 mg/dL — ABNORMAL HIGH (ref 65–99)
Potassium: 4.5 mmol/L (ref 3.5–5.1)
Sodium: 132 mmol/L — ABNORMAL LOW (ref 135–145)
TOTAL PROTEIN: 5.9 g/dL — AB (ref 6.5–8.1)

## 2017-11-17 LAB — CBC WITH DIFFERENTIAL/PLATELET
Basophils Absolute: 0 10*3/uL (ref 0.0–0.1)
Basophils Relative: 0 %
EOS ABS: 0 10*3/uL (ref 0.0–0.7)
EOS PCT: 0 %
HCT: 27.9 % — ABNORMAL LOW (ref 39.0–52.0)
HEMOGLOBIN: 8.2 g/dL — AB (ref 13.0–17.0)
Lymphocytes Relative: 4 %
Lymphs Abs: 0.6 10*3/uL — ABNORMAL LOW (ref 0.7–4.0)
MCH: 29.4 pg (ref 26.0–34.0)
MCHC: 29.4 g/dL — AB (ref 30.0–36.0)
MCV: 100 fL (ref 78.0–100.0)
MONOS PCT: 4 %
Monocytes Absolute: 0.5 10*3/uL (ref 0.1–1.0)
NEUTROS PCT: 92 %
Neutro Abs: 13 10*3/uL — ABNORMAL HIGH (ref 1.7–7.7)
PLATELETS: 335 10*3/uL (ref 150–400)
RBC: 2.79 MIL/uL — ABNORMAL LOW (ref 4.22–5.81)
RDW: 17.1 % — ABNORMAL HIGH (ref 11.5–15.5)
WBC: 14.1 10*3/uL — ABNORMAL HIGH (ref 4.0–10.5)

## 2017-11-17 LAB — PROTIME-INR
INR: 5.99
Prothrombin Time: 53.1 seconds — ABNORMAL HIGH (ref 11.4–15.2)

## 2017-11-17 LAB — I-STAT CHEM 8, ED
BUN: 83 mg/dL — AB (ref 6–20)
CHLORIDE: 91 mmol/L — AB (ref 101–111)
Calcium, Ion: 1.18 mmol/L (ref 1.15–1.40)
Creatinine, Ser: 8.5 mg/dL — ABNORMAL HIGH (ref 0.61–1.24)
GLUCOSE: 261 mg/dL — AB (ref 65–99)
HCT: 31 % — ABNORMAL LOW (ref 39.0–52.0)
Hemoglobin: 10.5 g/dL — ABNORMAL LOW (ref 13.0–17.0)
Potassium: 4.4 mmol/L (ref 3.5–5.1)
SODIUM: 131 mmol/L — AB (ref 135–145)
TCO2: 28 mmol/L (ref 22–32)

## 2017-11-17 LAB — PHOSPHORUS: Phosphorus: 2.9 mg/dL (ref 2.5–4.6)

## 2017-11-17 LAB — I-STAT CG4 LACTIC ACID, ED: Lactic Acid, Venous: 1.11 mmol/L (ref 0.5–1.9)

## 2017-11-17 MED ORDER — PHYTONADIONE 5 MG PO TABS
2.5000 mg | ORAL_TABLET | Freq: Once | ORAL | Status: AC
  Administered 2017-11-17: 2.5 mg via ORAL
  Filled 2017-11-17: qty 1

## 2017-11-17 MED ORDER — IMMUNE GLOBULIN (HUMAN) 20 GM/200ML IV SOLN
400.0000 mg/kg | INTRAVENOUS | Status: AC
  Administered 2017-11-17 – 2017-11-20 (×5): 30 g via INTRAVENOUS
  Filled 2017-11-17 (×5): qty 100

## 2017-11-17 MED ORDER — CEFAZOLIN SODIUM-DEXTROSE 1-4 GM/50ML-% IV SOLN
1.0000 g | INTRAVENOUS | Status: DC
  Administered 2017-11-17 – 2017-11-22 (×6): 1 g via INTRAVENOUS
  Filled 2017-11-17 (×7): qty 50

## 2017-11-17 MED ORDER — SILVER NITRATE-POT NITRATE 75-25 % EX MISC
1.0000 | Freq: Once | CUTANEOUS | Status: AC
  Administered 2017-11-17: 1 via TOPICAL
  Filled 2017-11-17: qty 1

## 2017-11-17 NOTE — H&P (Signed)
History and Physical        Hospital Admission Note Date: 11/17/2017  Patient name: James David Medical record number: [id number] Date of birth: February 16, 1936 Age: 82 y.o. Gender: male  PCP: [name], MD    Patient coming from: home   I have reviewed all records in the [facility]    Chief Complaint:   generalized weakness in the last 3 weeks, worse today  HPI: Patient is a 82 year old male with ESRD on HD, MWF, atrial fibrillation on Coumadin, history of myasthenia gravis on prednisone, CAD, history of CVA, second-degree heart block with pacemaker, diabetes mellitus presented to ED with worsening weakness. Patient reported that he has noticed worsening weakness in the last 3 weeks. He was just discharged home on 2/27, was treated for progressive weakness, cellulitis of his right foot on doxycycline and IV Fortaz with hemodialysis for 6 weeks. Patient was seen by vascular surgery who recommended bilateral AKA for bilateral heel ulcers, left great toe, right anterior foot and outer ankle wounds, nonhealing however patient refused. He was seen by psychiatry and patient was deemed as unable to have capacity to make decisions. Patient's spouse understood overall poor prognosis however did not wish to pursue medication either. At baseline, patient reports that he is unable to ambulate and uses wheelchair. Today he was found to have increased generalized weakness by his home health nurse and he was unable to transfer to his wheelchair. Patient also reported that he had difficulty keeping his head up due to weakness and did not go for his regular HD today. Patient reported a week of cough and congestion but no fevers chills, no significant sputum production. No nausea, vomiting, diarrhea.  ED work-up/course:  BP 120/50 , temp 97.8, respiratory rate 18, heart rate 88 Sodium  131, potassium 4.4, BUN 83, creatinine 8.5, hemoglobin 10.5, hematocrit 31.0 Chest x-ray showed small left pleural effusion, no focal consolidation Patient was seen by neurology who recommended IVIG for myasthenia gravis crisis. Nephrology also consulted  Review of Systems: Positives marked in 'bold' Constitutional: Denies fever, chills, diaphoresis, poor appetite and +fatigue.  HEENT: Denies photophobia, eye pain, redness, hearing loss, ear pain, congestion, sore throat, rhinorrhea, sneezing, mouth sores, trouble swallowing, neck pain, neck stiffness and tinnitus.   Respiratory: Please see history of present illness  Cardiovascular: Denies chest pain, palpitations and leg swelling.  Gastrointestinal: Denies nausea, vomiting, abdominal pain, diarrhea, constipation, blood in stool and abdominal distention.  Genitourinary: Denies dysuria, urgency, frequency, hematuria, flank pain and difficulty urinating.  Musculoskeletal: Denies myalgias, back pain, joint swelling. Please see history of present illness, unable to ambulate on his own. Skin: Denies pallor, rash and wound.  Neurological: Please see history of present illness  Hematological: Denies adenopathy. Easy bruising, personal or family bleeding history  Psychiatric/Behavioral: Denies suicidal ideation, mood changes, confusion, nervousness, sleep disturbance and agitation  Past Medical History: Past Medical History:  Diagnosis Date  . Anemia 07/28/2017  . Atrial fibrillation ([place])    on Coumadin  . CHF (congestive heart failure) ([place])   . DDD (degenerative disc disease)   . Diabetes mellitus without complication ([place])   . ESRD (end stage renal disease)  on dialysis [name])   . Hyperlipidemia   . Hypertension   . Melanoma ([place]) 2010   removed at [facility]  . Second degree heart block    a. s/p STJ dual chamber PPM - Dr [name]  . Stroke [facility]) 01/2017    Past Surgical History:  Procedure Laterality Date  . A/V FISTULAGRAM N/A 06/11/2017    Procedure: A/V Fistulagram;  Surgeon: Waynetta Sandy, MD;  Location: [place] [facility];  Service: Cardiovascular;  Laterality: N/A;  . ABDOMINAL AORTOGRAM N/A 10/22/2017   Procedure: ABDOMINAL AORTOGRAM;  Surgeon: Elam Dutch, MD;  Location: [place] [facility];  Service: Cardiovascular;  Laterality: N/A;  . AV FISTULA PLACEMENT Right 05/31/2014   Procedure: Right Arm Brachiocephalic ARTERIOVENOUS FISTULA CREATION  ;  Surgeon: Conrad [place], MD;  Location: [place];  Service: Vascular;  Laterality: Right;  . CATARACT EXTRACTION W/ INTRAOCULAR LENS  IMPLANT, BILATERAL    . COLONOSCOPY    . EXCISION MELANOMA WITH SENTINEL LYMPH NODE BIOPSY Right 02/08/2016   Procedure: WIDE EXCISION RIGHT SHOULDER MELANOMA WITH RIGHT SENTINEL LYMPH NODE BIOPSY;  Surgeon: [name], MD;  Location: [place];  Service: General;  Laterality: Right;  . LAMINECTOMY    . LOWER EXTREMITY ANGIOGRAPHY Bilateral 10/22/2017   Procedure: Lower Extremity Angiography;  Surgeon: Elam Dutch, MD;  Location: [place] [facility];  Service: Cardiovascular;  Laterality: Bilateral;  . PERIPHERAL VASCULAR BALLOON ANGIOPLASTY Right 06/11/2017   Procedure: PERIPHERAL VASCULAR BALLOON ANGIOPLASTY;  Surgeon: Waynetta Sandy, MD;  Location: [place] [facility];  Service: Cardiovascular;  Laterality: Right;  arm fistula  . PERMANENT PACEMAKER INSERTION N/A 01/20/2014   STJ Assurity dual chamber pacemaker implanted by Dr [name] for 2nd degree AV block    Medications: Prior to Admission medications   Medication Sig Start Date End Date Taking? Authorizing Provider  acetaminophen (TYLENOL) 500 MG tablet Take 500-1,000 mg by mouth daily as needed for moderate pain or headache.    Yes [provider]  ARTIFICIAL TEAR OP Place 1 drop into both eyes daily.   Yes [provider]  aspirin EC 81 MG tablet Take 81 mg by mouth at bedtime.   Yes [provider]  calcium acetate (PHOSLO) 667 MG capsule Take  667-1,334 mg by mouth See admin instructions. Take 1334 mg by mouth three times daily with meals, take 1 capsule (667 mg) with snacks   Yes [provider]  carvedilol (COREG) 25 MG tablet Take 25 mg by mouth 2 (two) times daily.   Yes [provider]  cefTAZidime 2 g in sodium chloride 0.9 % 100 mL Inject 2 g into the vein every Monday, Wednesday, and Friday with hemodialysis. 11/12/17  Yes Ghimire, Henreitta Leber, MD  collagenase (SANTYL) ointment Apply daily topically. 08/01/17  Yes Geradine Girt, DO  doxycycline (VIBRA-TABS) 100 MG tablet Take 1 tablet (100 mg total) by mouth every 12 (twelve) hours. 11/12/17  Yes Ghimire, Henreitta Leber, MD  ENSURE (ENSURE) Take 1 Can by mouth every other day. On non dialysis days   Yes [provider]  ezetimibe (ZETIA) 10 MG tablet Take 1 tablet (10 mg total) by mouth daily. 02/14/17  Yes Angiulli, Lavon Paganini, PA-C  fenofibrate 160 MG tablet Take 160 mg by mouth daily.   Yes [provider]  finasteride (PROSCAR) 5 MG tablet Take 1 tablet (5 mg total) by mouth daily. 02/14/17  Yes Angiulli, Lavon Paganini, PA-C  folic acid-vitamin b complex-vitamin c-selenium-zinc (DIALYVITE)  3 MG TABS tablet Take 1 tablet at bedtime by mouth.   Yes [provider]  glimepiride (AMARYL) 1 MG tablet Take 1 tablet (1 mg total) by mouth daily with breakfast. Patient taking differently: Take 1-2 mg by mouth daily with breakfast. Take 2mg  once daily if blood sugar is 150 or higher 02/14/17  Yes Angiulli, Lavon Paganini, PA-C  lanthanum (FOSRENOL) 1000 MG chewable tablet Chew 1,000 mg by mouth 3 (three) times daily with meals.   Yes [provider]  Melatonin 3 MG TABS Take 3 mg at bedtime by mouth.   Yes [provider]  povidone-iodine (BETADINE) 10 % ointment Apply 1 application topically 3 (three) times a week.   Yes [provider]  predniSONE (DELTASONE) 20 MG tablet Take 1 tablet (20 mg total) by mouth daily with breakfast. Please note  to continue taking 20 mg tablet for now  and you must be evaluated by your neurologist (Dr. [name]) to determine next Prednisone dose and frequency. Patient taking differently: Take 20 mg by mouth daily with breakfast.  03/26/17  Yes Bonnielee Haff, MD  traMADol (ULTRAM) 50 MG tablet Take 50-100 mg by mouth 2 (two) times daily as needed for moderate pain.   Yes [provider]  warfarin (COUMADIN) 1 MG tablet 1 1/2 tab daily Patient taking differently: Take 1 mg by mouth one time only at 6 PM.  11/13/17  Yes Ghimire, Henreitta Leber, MD  Nutritional Supplements (FEEDING SUPPLEMENT, NEPRO CARB STEADY,) LIQD Take 237 mLs by mouth 2 (two) times daily between meals. Patient taking differently: Take 237 mLs by mouth every Monday, Wednesday, and Friday. At dialysis 03/25/17   Theodis Blaze, MD    Allergies:   Allergies  Allergen Reactions  . Amlodipine Besylate Swelling    Angioedema   . Lisinopril Other (See Comments)    Renal insufficiency  . Statins Other (See Comments)    Muscle aches  . Actos [Pioglitazone]     Chest pain  . Ropinirole Other (See Comments)    Causes excessive grogginess the following morning after taking  . Tape Other (See Comments)    SKIN IS THIN AND TAPE WILL ACTUALLY TEAR IT; PLEASE USE COBAN WRAP INSTEAD!!    Social History:  reports that  has never smoked. he has never used smokeless tobacco. He reports that he does not drink alcohol or use drugs.  Family History: Family History  Problem Relation Age of Onset  . Pulmonary embolism Mother   . Diabetes Mother   . Heart attack Father   . Diabetes Brother     Physical Exam: Blood pressure (!) 119/51, pulse (!) 121, temperature 97.8 F (36.6 C), temperature source Oral, resp. rate 15, SpO2 100 %. General: Alert, awake, oriented x2, in no acute distress. Eyes: pink conjunctiva,anicteric sclera, pupils equal and reactive to light and accomodation, HEENT: normocephalic, atraumatic, oropharynx clear Neck:  supple, no masses or lymphadenopathy, no goiter, no bruits, no JVD CVS: Regular rate and rhythm, without murmurs, rubs or gallops. No lower extremity edema Resp : Clear to auscultation bilaterally, no wheezing, rales or rhonchi. GI : Soft, nontender, nondistended, positive bowel sounds, no masses. No hepatomegaly. No hernia.  Musculoskeletal: No clubbing or cyanosis. Muscle atrophy b/l LE  Neuro: alert and oriented, right upper extremity 4/5, right lower extremity 3/5, able to lift up, left upper extremity 5/5, left lower extremity 3/5. Weakness in the lower extremity, left-sided facial droop, no dysarthria Psych: alert and oriented x 2, normal  mood and affect Skin: Abrasion on the right arm near elbow, bleeding. Right and left heel ulcers with eschar, left great toe gangrene, abrasion on the right foot   LABS on Admission: I have personally reviewed all the labs and imagings below    Basic Metabolic Panel: Recent Labs  Lab 11/12/17 0820 11/17/17 1345 11/17/17 1353  NA 134* 132* 131*  K 4.0 4.5 4.4  CL 97* 91* 91*  CO2 22 24  --   GLUCOSE 94 276* 261*  BUN 48* 83* 83*  CREATININE 5.59* 8.36* 8.50*  CALCIUM 9.1 9.3  --   PHOS 4.8*  --   --    Liver Function Tests: Recent Labs  Lab 11/12/17 0820 11/17/17 1345  AST  --  22  ALT  --  30  ALKPHOS  --  50  BILITOT  --  0.8  PROT  --  5.9*  ALBUMIN 2.1* 2.2*   No results for input(s): LIPASE, AMYLASE in the last 168 hours. No results for input(s): AMMONIA in the last 168 hours. CBC: Recent Labs  Lab 11/12/17 0825 11/17/17 1345 11/17/17 1353  WBC 11.6* 14.1*  --   NEUTROABS  --  13.0*  --   HGB 8.8* 8.2* 10.5*  HCT 28.4* 27.9* 31.0*  MCV 101.4* 100.0  --   PLT 377 335  --    Cardiac Enzymes: No results for input(s): CKTOTAL, CKMB, CKMBINDEX, TROPONINI in the last 168 hours. BNP: Invalid input(s): POCBNP CBG: Recent Labs  Lab 11/11/17 2200 11/12/17 1621  GLUCAP 109* 253*    Radiological Exams on Admission:    Dg Chest Port 1 View  Result Date: 11/17/2017 CLINICAL DATA:  Cough and weakness for 1 day. History of CHF, end-stage renal disease on dialysis. EXAM: PORTABLE CHEST 1 VIEW COMPARISON:  Chest radiograph November 04, 2017 FINDINGS: Cardiac silhouette is mildly enlarged and unchanged. Mediastinal silhouette is non suspicious. Calcified aortic knob. Small LEFT pleural effusion, RIGHT costophrenic angle out of field-of-view. Pulmonary vasculature appears normal. No focal consolidation. No pneumothorax. RIGHT subclavian vascular stent. Dual lead LEFT cardiac pacemaker in situ. ACDF. Surgical clips LEFT axilla. Advanced RIGHT acromioclavicular osteoarthrosis. IMPRESSION: Small LEFT pleural effusion.  Stable cardiomegaly. Aortic Atherosclerosis (ICD10-I70.0). Electronically Signed   By: Elon Alas M.D.   On: 11/17/2017 15:03      EKG: Independently reviewed. Rate 76, paced rhythm   Assessment/Plan Principal Problem:   Myasthenia gravis in crisis [facility]) - Patient has been evaluated by neurology and feel that his symptoms are likely related to myasthenia gravis crisis - Discussed with Dr. Leonel Ramsay, recommended IVIG for over 5 days, continue same dose of prednisone 20 mg daily (no need for stress doses) - CT head to rule out CVA, no MRI as patient has incompatible [employer] pacemaker - Will admit to stepdown unit for at least 24 hours as he has prior history of intubation with myasthenia gravis crisis  Active Problems:   ESRD (end stage renal disease) on dialysis [facility]) - Nephrology consulted, discussed with Dr. Melvia Heaps, will dialyze in a.m. - Normal dialysis schedule MW F    Diabetes mellitus due to underlying condition with diabetic nephropathy ([place]) - Place on sliding scale insulin while inpatient, obtain hemoglobin A 1C    Chronic diastolic heart failure ([place]) - Currently compensated, volume management with HD   - 2-D echo 03/2017 showed EF of 50-55%  Pressure injury of the skin with  bilateral heel ulcers, ankle wound, left great toe wound - Patient was  seen by vascular surgery during the previous admission who recommended bilateral AKA for bilateral heel ulcers, left great toe, right anterior foot and outer ankle wounds, nonhealing however patient refused. He was seen by psychiatry and patient was deemed as unable to have capacity to make decisions. Patient's spouse understood overall poor prognosis however did not wish to pursue medication either. - Discussed with neurology, recommended no vancomycin or doxycycline as it may exacerbate myasthenia gravis - Changed antibiotics to IV Ancef, follow blood Cultures - Wound care consult    Atrial fibrillation, permanent ([facility]) - Currently rate controlled. BP soft, will decrease Coreg to 12.5 mg twice a day - Hold Coumadin, INR supratherapeutic 5.9 and bleeding from the right arm abrasion - Will give vitamin K 2.5 mg by mouth 1 - Coumadin will be per pharmacy    Dyslipidemia - Continue Zetia, fenofibrate     Dysphagia - Full liquid diet for now, SLP evaluation in a.m.    Generalized weakness - PT OT evaluation once more stable  DVT prophylaxis: Coumadin however INR supratherapeutic  CODE STATUS: full   Consults called: neuro, renal   Family Communication: Admission, patients condition and plan of care including tests being ordered have been discussed with the patient  who indicates understanding and agree with the plan and Code Status  Admission status: SDU, inpatient  Disposition plan: Further plan will depend as patient's clinical course evolves and further radiologic and laboratory data become available.    At the time of admission, it appears that the appropriate admission status for this patient is INPATIENT . This is judged to be reasonable and necessary in order to provide the required intensity of service to ensure the patient's safety given the presenting symptoms, worsening weakness, consistent with  myasthenia gravis crisis, physical exam findings bilateral lower exhibit any weakness, facial droop, and initial radiographic and laboratory data in the context of their chronic comorbidities.  The medical decision making on this patient was of high complexity and the patient is at high risk for clinical deterioration, therefore this is a level 3 visit.   Time Spent on Admission: 70 minutes    Lavaya Defreitas M.D. [facility] 11/17/2017, 5:48 PM Pager: [phone]  If 7PM-7AM, please contact night-coverage [email or web address] Password [email or web address]

## 2017-11-17 NOTE — ED Notes (Signed)
Critical lab provided to A. Kenton Kingfisher

## 2017-11-17 NOTE — ED Notes (Signed)
Hospitalist at bedside 

## 2017-11-17 NOTE — ED Triage Notes (Signed)
Pt arrives via EMS from home with generalized weakness that began yesterday. States was unable to go to regularly scheduled dialysis due to todays weakness. Normal schedule is MWF. Pt reports non productive, congested sounding cough for the last 4 days. Seen here for the same last week. States is not taking anything for the cough. VSS. CBG 262

## 2017-11-17 NOTE — ED Notes (Signed)
Phlebotomy at bedside.

## 2017-11-17 NOTE — ED Notes (Signed)
Patient transported to CT 

## 2017-11-17 NOTE — ED Notes (Signed)
EDP at bedside providing wound care.

## 2017-11-17 NOTE — ED Provider Notes (Signed)
[facility] EMERGENCY DEPARTMENT Provider Note   CSN: [id number] Arrival date & time: 11/17/17  1115     History   Chief Complaint Chief Complaint  Patient presents with  . Weakness    HPI ELIAZ FOUT is a 82 y.o. male who  has a past medical history of Anemia (07/28/2017), Atrial fibrillation ([place]), CHF (congestive heart failure) ([place]), DDD (degenerative disc disease), Diabetes mellitus without complication ([place]), ESRD (end stage renal disease) on dialysis ([place]), Hyperlipidemia, Hypertension, Melanoma ([place]) (2010), Second degree heart block, and Stroke ([place]) (01/2017). He presents to the ED for weakness. History id given by the patient's wife . She states that today the patient was supposed to go to Dialysis, however he was too weak to sit up on his own. He has had this in the past with his Myasthenia  Gravis. He was doing well yesterday. He has had a cough but it is non-productive.He has a foot wound on the right that is painful and bing cared for by wound care nurse. Patient dialyzes M/W/F and had full dialysis on Friday.  Patient was discharged on 11/12/2017 after presenting with similar complaints.  He had been seen by neurology at that time and was not felt to have a myasthenia crisis however he was given prednisone which she has been continuing.  The patient's leg wounds are chronic and there is no option for revascularization.  He has been given the option to amputate however has refused repeatedly.  Discharge the patient refused skilled nursing facility placement.  He has had a psychiatric evaluation that shows he does not have capacity to make medical decisions.  His wife is currently making these decisions.  Patient has not been running fevers.  HPI  Past Medical History:  Diagnosis Date  . Anemia 07/28/2017  . Atrial fibrillation ([place])    on Coumadin  . CHF (congestive heart failure) ([place])   . DDD (degenerative disc disease)   . Diabetes mellitus without  complication ([place])   . ESRD (end stage renal disease) on dialysis ([place])   . Hyperlipidemia   . Hypertension   . Melanoma ([place]) 2010   removed at [facility]  . Second degree heart block    a. s/p STJ dual chamber PPM - Dr [name]  . Stroke [facility]) 01/2017    Patient Active Problem List   Diagnosis Date Noted  . Evaluation by psychiatric service required   . Palliative care by specialist   . Cellulitis of right lower extremity 11/04/2017  . Generalized weakness 11/04/2017  . Cervical stenosis of spine 08/12/2017  . UTI (urinary tract infection) 07/27/2017  . Weakness 07/27/2017  . Left elbow pain 07/27/2017  . Pressure injury of skin 06/11/2017  . Bleeding at insertion site 06/09/2017  . Myasthenia gravis ([place])   . Benign essential HTN   . Labile blood glucose   . Dysphagia   . Leukocytosis   . Acute respiratory failure with hypoxia ([place])   . Myasthenia gravis in crisis ([place]) 03/17/2017  . History of CVA in adulthood 03/17/2017  . Atrial fibrillation, permanent ([place]) 03/17/2017  . Supratherapeutic INR 03/17/2017  . Dyslipidemia 03/17/2017  . Anemia 03/17/2017  . Low back pain 02/28/2017  . Neck pain 02/28/2017  . Polyneuropathy 02/28/2017  . Gait disturbance, post-stroke   . Subcortical infarction ([place]) 02/06/2017  . Intracranial vascular stenosis 02/04/2017  . Syncope   . Orthostatic hypotension   . Stroke ([place]) 02/01/2017  . TIA (transient ischemic attack) 01/29/2017  .  CKD (chronic kidney disease) stage V requiring chronic dialysis ([place]) 01/29/2017  . DDD (degenerative disc disease) 01/29/2017  . ESRD (end stage renal disease) on dialysis ([place])   . Malignant melanoma of arm ([place]) 03/13/2016  . Renal lesion 03/10/2016  . BPH (benign prostatic hyperplasia) 03/07/2016  . Goals of care, counseling/discussion 07/05/2015  . Pacemaker 10/13/2014  . CHF (congestive heart failure) ([place]) 05/25/2014  . History of melanoma 05/25/2014  . Chronic diastolic heart failure ([place])  03/24/2014  . Second-degree heart block 12/13/2013  . Essential hypertension 12/13/2013  . Diabetes mellitus due to underlying condition with diabetic nephropathy ([place]) 12/13/2013  . Hyperlipidemia 12/13/2013    Past Surgical History:  Procedure Laterality Date  . A/V FISTULAGRAM N/A 06/11/2017   Procedure: A/V Fistulagram;  Surgeon: Waynetta Sandy, MD;  Location: [facility] CV LAB;  Service: Cardiovascular;  Laterality: N/A;  . ABDOMINAL AORTOGRAM N/A 10/22/2017   Procedure: ABDOMINAL AORTOGRAM;  Surgeon: Elam Dutch, MD;  Location: [place] CV LAB;  Service: Cardiovascular;  Laterality: N/A;  . AV FISTULA PLACEMENT Right 05/31/2014   Procedure: Right Arm Brachiocephalic ARTERIOVENOUS FISTULA CREATION  ;  Surgeon: Conrad Pollock, MD;  Location: [facility];  Service: Vascular;  Laterality: Right;  . CATARACT EXTRACTION W/ INTRAOCULAR LENS  IMPLANT, BILATERAL    . COLONOSCOPY    . EXCISION MELANOMA WITH SENTINEL LYMPH NODE BIOPSY Right 02/08/2016   Procedure: WIDE EXCISION RIGHT SHOULDER MELANOMA WITH RIGHT SENTINEL LYMPH NODE BIOPSY;  Surgeon: [name], MD;  Location: [facility];  Service: General;  Laterality: Right;  . LAMINECTOMY    . LOWER EXTREMITY ANGIOGRAPHY Bilateral 10/22/2017   Procedure: Lower Extremity Angiography;  Surgeon: Elam Dutch, MD;  Location: [facility] CV LAB;  Service: Cardiovascular;  Laterality: Bilateral;  . PERIPHERAL VASCULAR BALLOON ANGIOPLASTY Right 06/11/2017   Procedure: PERIPHERAL VASCULAR BALLOON ANGIOPLASTY;  Surgeon: Waynetta Sandy, MD;  Location: [facility] CV LAB;  Service: Cardiovascular;  Laterality: Right;  arm fistula  . PERMANENT PACEMAKER INSERTION N/A 01/20/2014   STJ Assurity dual chamber pacemaker implanted by Dr [name] for 2nd degree AV block       Home Medications    Prior to Admission medications   Medication Sig Start Date End Date Taking? Authorizing Provider  acetaminophen (TYLENOL) 500 MG tablet Take  500-1,000 mg by mouth daily as needed for moderate pain or headache.     [provider]  ARTIFICIAL TEAR OP Place 1 drop into both eyes daily.    [provider]  aspirin EC 81 MG tablet Take 81 mg by mouth at bedtime.    [provider]  calcium acetate (PHOSLO) 667 MG capsule Take 667-1,334 mg by mouth See admin instructions. Take 1334 mg by mouth three times daily with meals, take 1 capsule (667 mg) with snacks    [provider]  carvedilol (COREG) 25 MG tablet Take 25 mg by mouth 2 (two) times daily.    [provider]  cefTAZidime 2 g in sodium chloride 0.9 % 100 mL Inject 2 g into the vein every Monday, Wednesday, and Friday with hemodialysis. 11/12/17   Ghimire, Henreitta Leber, MD  collagenase (SANTYL) ointment Apply daily topically. Patient not taking: Reported on 10/21/2017 08/01/17   Geradine Girt, DO  doxycycline (VIBRA-TABS) 100 MG tablet Take 1 tablet (100 mg total) by mouth every 12 (twelve) hours. 11/12/17   Ghimire, Henreitta Leber, MD  ENSURE (ENSURE) Take 1 Can by mouth every other day.  On non dialysis days    [provider]  ezetimibe (ZETIA) 10 MG tablet Take 1 tablet (10 mg total) by mouth daily. 02/14/17   Angiulli, Lavon Paganini, PA-C  fenofibrate 160 MG tablet Take 160 mg by mouth daily.    [provider]  finasteride (PROSCAR) 5 MG tablet Take 1 tablet (5 mg total) by mouth daily. 02/14/17   Angiulli, Lavon Paganini, PA-C  folic acid-vitamin b complex-vitamin c-selenium-zinc (DIALYVITE) 3 MG TABS tablet Take 1 tablet at bedtime by mouth.    [provider]  glimepiride (AMARYL) 1 MG tablet Take 1 tablet (1 mg total) by mouth daily with breakfast. Patient taking differently: Take 1-2 mg by mouth daily with breakfast. Take 2mg  once daily if blood sugar is 150 or higher 02/14/17   Angiulli, Lavon Paganini, PA-C  HYDROcodone-acetaminophen (NORCO/VICODIN) 5-325 MG tablet Take 1-2 tablets by mouth every 4 (four) hours as needed for moderate  pain or severe pain. Patient not taking: Reported on 10/21/2017 08/27/17   Charlesetta Shanks, MD  lanthanum (FOSRENOL) 1000 MG chewable tablet Chew 1,000 mg by mouth 3 (three) times daily with meals.    [provider]  Melatonin 3 MG TABS Take 3 mg at bedtime by mouth.    [provider]  Nutritional Supplements (FEEDING SUPPLEMENT, NEPRO CARB STEADY,) LIQD Take 237 mLs by mouth 2 (two) times daily between meals. Patient taking differently: Take 237 mLs by mouth every Monday, Wednesday, and Friday. At dialysis 03/25/17   Theodis Blaze, MD  povidone-iodine (BETADINE) 10 % ointment Apply 1 application topically 3 (three) times a week.    [provider]  predniSONE (DELTASONE) 20 MG tablet Take 1 tablet (20 mg total) by mouth daily with breakfast. Please note to continue taking 20 mg tablet for now  and you must be evaluated by your neurologist (Dr. [name]) to determine next Prednisone dose and frequency. 03/26/17   Bonnielee Haff, MD  traMADol (ULTRAM) 50 MG tablet Take 50-100 mg by mouth 2 (two) times daily as needed for moderate pain.    [provider]  warfarin (COUMADIN) 1 MG tablet 1 1/2 tab daily 11/13/17   Ghimire, Henreitta Leber, MD    Family History Family History  Problem Relation Age of Onset  . Pulmonary embolism Mother   . Diabetes Mother   . Heart attack Father   . Diabetes Brother     Social History Social History   Tobacco Use  . Smoking status: Never Smoker  . Smokeless tobacco: Never Used  Substance Use Topics  . Alcohol use: No  . Drug use: No     Allergies   Amlodipine besylate; Lisinopril; Statins; Actos [pioglitazone]; Ropinirole; and Tape   Review of Systems Review of Systems  Unable to perform ROS: Dementia         Physical Exam Updated Vital Signs BP (!) 120/50 (BP Location: Left Arm)   Pulse 88   Temp 97.8 F (36.6 C) (Oral)   Resp 18   SpO2 98%   Physical Exam  Constitutional: He appears well-developed and  well-nourished. No distress.  HENT:  Head: Normocephalic and atraumatic.  Eyes: Conjunctivae and EOM are normal. Pupils are equal, round, and reactive to light. No scleral icterus.  Neck: Normal range of motion. Neck supple. No JVD present.  Cardiovascular: Normal rate, regular rhythm and normal heart sounds.  Pulmonary/Chest: Effort normal and breath sounds normal. No respiratory distress. He has no wheezes. He has no rales.  Abdominal: Soft.  There is no tenderness.  Musculoskeletal: He exhibits no edema.  Neurological: He is alert.  Skin: Skin is warm and dry. He is not diaphoretic.  Large black eschar on the R heel. Wounds over the dorsum of the R foot.  Psychiatric: His behavior is normal.  Nursing note and vitals reviewed.    ED Treatments / Results  Labs (all labs ordered are listed, but only abnormal results are displayed) Labs Reviewed  CBC WITH DIFFERENTIAL/PLATELET - Abnormal; Notable for the following components:      Result Value   WBC 14.1 (*)    RBC 2.79 (*)    Hemoglobin 8.2 (*)    HCT 27.9 (*)    MCHC 29.4 (*)    RDW 17.1 (*)    Neutro Abs 13.0 (*)    Lymphs Abs 0.6 (*)    All other components within normal limits  I-STAT CHEM 8, ED - Abnormal; Notable for the following components:   Sodium 131 (*)    Chloride 91 (*)    BUN 83 (*)    Creatinine, Ser 8.50 (*)    Glucose, Bld 261 (*)    Hemoglobin 10.5 (*)    HCT 31.0 (*)    All other components within normal limits  CULTURE, BLOOD (ROUTINE X 2)  CULTURE, BLOOD (ROUTINE X 2)  COMPREHENSIVE METABOLIC PANEL  URINALYSIS, ROUTINE W REFLEX MICROSCOPIC  PROTIME-INR  I-STAT CG4 LACTIC ACID, ED    EKG  EKG Interpretation None       Radiology No results found.  Procedures Procedures (including critical care time)  Medications Ordered in ED Medications - No data to display   Initial Impression / Assessment and Plan / ED Course  I have reviewed the triage vital signs and the nursing  notes.  Pertinent labs & imaging results that were available during my care of the patient were reviewed by me and considered in my medical decision making (see chart for details).  Clinical Course as of Nov 18 1642  Mon Nov 17, 2017  1419 Consulted Dr. Leonel Ramsay who will see the patient  [AH]  1515 Anion gap: (!) 17 [AH]  1515 Anion gap likely due to the paitent's elevated BUN BUN: (!) 83 [AH]  1516 DG Chest Port 1 View [AH]    Clinical Course User Index [AH] Margarita Mail, PA-C   EKg unchanged from previous  Patient will be  Admitted for myasthenia crisis. He is breathing well at this time.  Dr. Leonel Ramsay will consult with nephrology because the patient will need to be placed on IVIG.  He will also need continuous parenteral antibiotics for his wounds. Dr. Tana Coast will admit the patient.    Final Clinical Impressions(s) / ED Diagnoses   Final diagnoses:  Myasthenia gravis in crisis [facility])  Supratherapeutic INR    ED Discharge Orders    None       Margarita Mail, PA-C 11/17/17 1648    Virgel Manifold, MD 11/18/17 1454

## 2017-11-17 NOTE — Consult Note (Addendum)
Neurology Consultation Reason for Consult: Myasthenia gravis Referring Provider: Margarita Mail PA-C  CC: Generalized weakness  History is obtained from: Patient. Wife present and provides additional history.  HPI: James David is a 82 y.o. male with antibody positive Myasthenia gravis on Prednisone, T2DM, ESRD on MWF HD, Afib/flutter on Coumadin, HFpEF, CAD, CVA, 2nd degree heart block s/p PPM, Thyroid nodules who presents to the ED with worsened weakness this morning. At baseline, he is unable to ambulate on his own and relies on a wheelchair for transport. This morning he had increased generalized weakness, more prominent in his inner hips and was unable to transfer to his wheelchair with assistance. He also had difficulty keeping his head up due to weakness. Due to these new symptoms, he did not go to his regularly scheduled HD session today, but presented to the ED instead.   He reports about 1 week of cough and chest congestion without fevers, chills, or diaphoresis. He reports chronic muscle spasms without tremors.  He is currently taking Prednisone 20 mg daily for his myasthenia gravis (has not tolerated Mestinon due to GI side effects in the past).   He was recently admitted from 2/19-2/27 for generalized weakness and right ankle cellulitis. He was discharged on IV Fortaz with HD and oral Doxycycline which he continues to take. NCS/EMG studies 09/23/17 showed length dependent sensorimotor polyneuropathy felt most likely related to diabetic polyneuropathy.  ROS: A 14 point ROS was performed and is negative except as noted in the HPI.   Past Medical History:  Diagnosis Date  . Anemia 07/28/2017  . Atrial fibrillation ([place])    on Coumadin  . CHF (congestive heart failure) ([place])   . DDD (degenerative disc disease)   . Diabetes mellitus without complication ([place])   . ESRD (end stage renal disease) on dialysis ([place])   . Hyperlipidemia   . Hypertension   . Melanoma ([place]) 2010   removed  at [facility]  . Second degree heart block    a. s/p STJ dual chamber PPM - Dr [name]  . Stroke [facility]) 01/2017     Family History  Problem Relation Age of Onset  . Pulmonary embolism Mother   . Diabetes Mother   . Heart attack Father   . Diabetes Brother      Social History:  reports that  has never smoked. he has never used smokeless tobacco. He reports that he does not drink alcohol or use drugs.   Exam: Current vital signs: BP (!) 100/52   Pulse (!) 121   Temp 97.8 F (36.6 C) (Oral)   Resp (!) 23   SpO2 100%  Vital signs in last 24 hours: Temp:  [97.8 F (36.6 C)] 97.8 F (36.6 C) (03/04 1119) Pulse Rate:  [88-121] 121 (03/04 1500) Resp:  [18-24] 23 (03/04 1515) BP: (100-120)/(50-64) 100/52 (03/04 1515) SpO2:  [98 %-100 %] 100 % (03/04 1524)   Physical Exam  Constitutional: Appears well-developed and well-nourished.  Psych: Affect appropriate to situation Eyes: No scleral injection HENT: No OP obstrucion Head: Normocephalic.  Cardiovascular: Normal rate and regular rhythm. PPM left chest wall. Respiratory: Effort normal, non-labored breathing GI: Soft.  No distension. There is no tenderness.  Extremities: dependent edema Skin: bruising all extremities, arterial ulcer right lateral malleolus without active dishcarge, eschar right heel, left foot with wound dressing in place, Laceration right elbow  Neuro: Mental Status: Patient is awake, alert, oriented to person, place, but not month or year. Patient is able to give  a clear and coherent history. No signs of aphasia or neglect Cranial Nerves: II: Visual Fields are full. Pupils are equal, round, and reactive to light.   III,IV, VI: EOMI without ptosis or diploplia.  V: Facial sensation is symmetric to light touch VII: Facial movement shows slight left facial droop.  VIII: hearing is intact to voice X: Uvula elevates symmetrically XI: Shoulder shrug is symmetric. XII: tongue protrudes slightly leftward without  atrophy or fasciculations.  Motor: Tone is normal. Bulk is normal. No asterixis. No clonus. Fatigable weakness present. Neck flexion 4- RUE: 4/5 with extension/flexion at the elbow, deltoid LUE: 5/5 with extension/flexion/deltoid RLE: 2/5 with hip flexion, 5/5 knee extension, 2/5 APF, 5/5 ADF LLE: 2/5 hip flexion, 4/5 knee extension, 2/5 APF, 5/5 ADF Sensory: Sensation is symmetric to light touch in the arms and legs. Deep Tendon Reflexes: 2+ and symmetric in the biceps and patellae.  Cerebellar: FNF and HKS are intact bilaterally   I have reviewed labs in epic and the results pertinent to this consultation are: CMP     Component Value Date/Time   NA 131 (L) 11/17/2017 1353   NA 140 02/28/2017 0916   NA 138 10/17/2016 1303   K 4.4 11/17/2017 1353   K 5.1 10/17/2016 1303   CL 91 (L) 11/17/2017 1353   CO2 24 11/17/2017 1345   CO2 18 (L) 10/17/2016 1303   GLUCOSE 261 (H) 11/17/2017 1353   GLUCOSE 197 (H) 10/17/2016 1303   BUN 83 (H) 11/17/2017 1353   BUN 46 (H) 02/28/2017 0916   BUN 71.0 (H) 10/17/2016 1303   CREATININE 8.50 (H) 11/17/2017 1353   CREATININE 5.9 (HH) 10/17/2016 1303   CALCIUM 9.3 11/17/2017 1345   CALCIUM 8.9 10/17/2016 1303   PROT 5.9 (L) 11/17/2017 1345   PROT 6.2 09/23/2017 1513   PROT 6.2 (L) 10/17/2016 1303   ALBUMIN 2.2 (L) 11/17/2017 1345   ALBUMIN 3.2 (L) 02/28/2017 0916   ALBUMIN 2.9 (L) 10/17/2016 1303   AST 22 11/17/2017 1345   AST 8 10/17/2016 1303   ALT 30 11/17/2017 1345   ALT 13 10/17/2016 1303   ALKPHOS 50 11/17/2017 1345   ALKPHOS 95 10/17/2016 1303   BILITOT 0.8 11/17/2017 1345   BILITOT 0.2 02/28/2017 0916   BILITOT 0.24 10/17/2016 1303   GFRNONAA 5 (L) 11/17/2017 1345   GFRAA 6 (L) 11/17/2017 1345   CBC    Component Value Date/Time   WBC 14.1 (H) 11/17/2017 1345   RBC 2.79 (L) 11/17/2017 1345   HGB 10.5 (L) 11/17/2017 1353   HGB 12.0 (L) 10/17/2016 1303   HCT 31.0 (L) 11/17/2017 1353   HCT 34.4 (L) 10/17/2016 1303   PLT  335 11/17/2017 1345   PLT 198 10/17/2016 1303   MCV 100.0 11/17/2017 1345   MCV 86.6 10/17/2016 1303   MCH 29.4 11/17/2017 1345   MCHC 29.4 (L) 11/17/2017 1345   RDW 17.1 (H) 11/17/2017 1345   RDW 13.3 10/17/2016 1303   LYMPHSABS 0.6 (L) 11/17/2017 1345   LYMPHSABS 1.0 10/17/2016 1303   MONOABS 0.5 11/17/2017 1345   MONOABS 0.5 10/17/2016 1303   EOSABS 0.0 11/17/2017 1345   EOSABS 0.2 10/17/2016 1303   BASOSABS 0.0 11/17/2017 1345   BASOSABS 0.0 10/17/2016 1303   Lactic acid 1.11  I have reviewed the images obtained: Portable CXR 1 View 3/4: No focal consolidation or opacity. Dual lead pacemaker in place.  Impression:  82 year old male with with antibody positive Myasthenia gravis on Prednisone,  T2DM, ESRD on MWF HD, Afib/flutter on Coumadin, HFpEF, CAD, CVA, 2nd degree heart block s/p PPM, Thyroid nodules who presents with worsening weakness particularly in his hips and difficulty holding his head up this morning. No asterixis or myoclonus present on initial examination. Given his presentation, there is high suspicion his current symptoms are related to his myasthenia gravis. At this time we recommend immunomodulatory therapy with IVIG vs plasmapheresis with preference for IVIG given recent skin infection. Discussed case with Nephrology, Dr. Jonnie Finner, who is okay wit IVIG in the setting of ESRD on HD. He has received IVIG in the past with symptomatic improvement.   Recommendations: 1) IVIG 400 mg/kg x 5 days (total 2000 mg over 5 days), Continue Prednisone 20 mg daily for now 2) Will obtain CT Head WO Contrast given new left facial droop (has MRI incompatible St Jude PPM) - although suspect this is likely an old deficit unmasked by acute issues 3) Discussed with Nephrology - will see for regularly scheduled HD 4) Ok to continue current antibiotics - of note Doxycycline may exacerbate myasthenia gravis 5) Admit to Hospitalist service   [name], MD Internal Medicine PGY-3

## 2017-11-17 NOTE — Progress Notes (Signed)
NIF performed with patient with good effort. NIF-30.

## 2017-11-17 NOTE — Progress Notes (Signed)
[facility] Progress Note  Subjective: pt dc'd last week on Wed, now in ED on Monday w/ progressive gen'd weakness.  Being admitted, has no c/o SOB, or CP, no abd pain, no n/v/d. +cough for about 4 days.  Getting HD w/o difficulty, using R arm AVF.  Seen by neurology, they are considering IVIG course over 5 days.    Vitals:   11/17/17 1615 11/17/17 1630 11/17/17 1715 11/17/17 1730  BP: (!) 127/53 101/62 (!) 121/52 (!) 119/51  Pulse:      Resp: 20 16 (!) 24 15  Temp:      TempSrc:      SpO2:        Inpatient medications: . Immune Globulin 10%  400 mg/kg Intravenous Q24 Hr x 5  . phytonadione  2.5 mg Oral Once      Exam: General: alert ,thin elderly white  NAD, tired but arousable, a bit confused Heart: RRR, noi m,r,g  Lungs: No JVD  CTA bilat Abdomen: soft , nt, nd Extremities: no edema noted lower extrem, multiple skin ulcers  Dialysis Access: RUA AVF pos bruit    Home meds: - phoslo ac/ zetia/ fenofibrate/ultram prn/ fosrenol ac/ ecasa/ proscar/ MVI - coumadin - pred 20 qd - fortaz IV at HD 2 gm tiw - doxy 100 bid  - coreg 25 bid - amaryl 1-2 mg qam    OP Dialysis:[facility] MWF (details pending) 4h 2/2.25 P2 Hep none RUA AVF  Assessment:  1Myasthenia gravis - progressive debility and gen'd weakness.  Per neuro, considering course of IVIG.   2  Bilat foot ulcers/ R lat malleolus osteo - vascular surg rec'd bilat amputation last admission but patient declined 3 ESRD HD MWF 4  Afib - on coumadin, BB 5 Vol - no excess on exam 6 Anemia ckd - cont esa as needed, next dose due 2/27 7. MBD ckd - check phos, hold binders for now  Plan - HD orders written for tomorrow off sched, then resume MWF   Kelly Splinter MD [facility] pager [phone]   11/17/2017, 5:37 PM   Recent Labs  Lab 11/12/17 0820 11/17/17 1345 11/17/17 1353  NA 134* 132* 131*  K 4.0 4.5 4.4  CL 97* 91* 91*  CO2 22 24  --   GLUCOSE 94 276* 261*  BUN  48* 83* 83*  CREATININE 5.59* 8.36* 8.50*  CALCIUM 9.1 9.3  --   PHOS 4.8*  --   --    Recent Labs  Lab 11/12/17 0820 11/17/17 1345  AST  --  22  ALT  --  30  ALKPHOS  --  50  BILITOT  --  0.8  PROT  --  5.9*  ALBUMIN 2.1* 2.2*   Recent Labs  Lab 11/12/17 0825 11/17/17 1345 11/17/17 1353  WBC 11.6* 14.1*  --   NEUTROABS  --  13.0*  --   HGB 8.8* 8.2* 10.5*  HCT 28.4* 27.9* 31.0*  MCV 101.4* 100.0  --   PLT 377 335  --    Iron/TIBC/Ferritin/ %Sat    Component Value Date/Time   IRON 26 (L) 03/18/2017 1017   TIBC 211 (L) 03/18/2017 1017   FERRITIN 457 (H) 03/18/2017 1017   IRONPCTSAT 12 (L) 03/18/2017 1017

## 2017-11-17 NOTE — ED Provider Notes (Signed)
Pt had a skin tear to right elbow that kept oozing, so I applied silver nitrate and quik clot dressing which did stop the bleeding.   James Pence, MD 11/17/17 2237

## 2017-11-18 ENCOUNTER — Other Ambulatory Visit: Payer: Self-pay

## 2017-11-18 DIAGNOSIS — G7001 Myasthenia gravis with (acute) exacerbation: Secondary | ICD-10-CM | POA: Diagnosis present

## 2017-11-18 LAB — URINALYSIS, ROUTINE W REFLEX MICROSCOPIC
BILIRUBIN URINE: NEGATIVE
GLUCOSE, UA: 150 mg/dL — AB
KETONES UR: NEGATIVE mg/dL
NITRITE: NEGATIVE
PH: 7 (ref 5.0–8.0)
PROTEIN: 100 mg/dL — AB
Specific Gravity, Urine: 1.015 (ref 1.005–1.030)

## 2017-11-18 LAB — PROTIME-INR
INR: 4.41 — AB
Prothrombin Time: 41.8 seconds — ABNORMAL HIGH (ref 11.4–15.2)

## 2017-11-18 LAB — GLUCOSE, CAPILLARY
GLUCOSE-CAPILLARY: 86 mg/dL (ref 65–99)
GLUCOSE-CAPILLARY: 275 mg/dL — AB (ref 65–99)
Glucose-Capillary: 142 mg/dL — ABNORMAL HIGH (ref 65–99)

## 2017-11-18 LAB — CBC
HEMATOCRIT: 26 % — AB (ref 39.0–52.0)
Hemoglobin: 7.9 g/dL — ABNORMAL LOW (ref 13.0–17.0)
MCH: 30.2 pg (ref 26.0–34.0)
MCHC: 30.4 g/dL (ref 30.0–36.0)
MCV: 99.2 fL (ref 78.0–100.0)
PLATELETS: 326 10*3/uL (ref 150–400)
RBC: 2.62 MIL/uL — AB (ref 4.22–5.81)
RDW: 17.2 % — AB (ref 11.5–15.5)
WBC: 11.1 10*3/uL — ABNORMAL HIGH (ref 4.0–10.5)

## 2017-11-18 LAB — CBG MONITORING, ED
Glucose-Capillary: 149 mg/dL — ABNORMAL HIGH (ref 65–99)
Glucose-Capillary: 92 mg/dL (ref 65–99)

## 2017-11-18 LAB — BASIC METABOLIC PANEL
ANION GAP: 16 — AB (ref 5–15)
BUN: 90 mg/dL — AB (ref 6–20)
CHLORIDE: 92 mmol/L — AB (ref 101–111)
CO2: 23 mmol/L (ref 22–32)
Calcium: 9 mg/dL (ref 8.9–10.3)
Creatinine, Ser: 8.76 mg/dL — ABNORMAL HIGH (ref 0.61–1.24)
GFR, EST AFRICAN AMERICAN: 6 mL/min — AB (ref >60)
GFR, EST NON AFRICAN AMERICAN: 5 mL/min — AB (ref >60)
Glucose, Bld: 183 mg/dL — ABNORMAL HIGH (ref 65–99)
POTASSIUM: 4.4 mmol/L (ref 3.5–5.1)
SODIUM: 131 mmol/L — AB (ref 135–145)

## 2017-11-18 LAB — HEMOGLOBIN A1C
Hgb A1c MFr Bld: 8.1 % — ABNORMAL HIGH (ref 4.8–5.6)
MEAN PLASMA GLUCOSE: 185.77 mg/dL

## 2017-11-18 LAB — PREPARE RBC (CROSSMATCH)

## 2017-11-18 MED ORDER — ACETAMINOPHEN 325 MG PO TABS
650.0000 mg | ORAL_TABLET | Freq: Four times a day (QID) | ORAL | Status: DC | PRN
  Administered 2017-11-18 – 2017-11-23 (×5): 650 mg via ORAL
  Filled 2017-11-18 (×5): qty 2

## 2017-11-18 MED ORDER — DIALYVITE 3000 3 MG PO TABS: 1.0000 | ORAL_TABLET | Freq: Every day | ORAL | Status: DC

## 2017-11-18 MED ORDER — COLLAGENASE 250 UNIT/GM EX OINT
TOPICAL_OINTMENT | Freq: Every day | CUTANEOUS | Status: DC
  Administered 2017-11-19: 19:00:00 via TOPICAL
  Administered 2017-11-22 – 2017-11-23 (×2): 1 via TOPICAL
  Filled 2017-11-18: qty 30

## 2017-11-18 MED ORDER — LIDOCAINE-PRILOCAINE 2.5-2.5 % EX CREA
1.0000 | TOPICAL_CREAM | CUTANEOUS | Status: DC | PRN
Start: 2017-11-18 — End: 2017-11-18
  Filled 2017-11-18: qty 5

## 2017-11-18 MED ORDER — PENTAFLUOROPROP-TETRAFLUOROETH EX AERO
1.0000 "application " | INHALATION_SPRAY | CUTANEOUS | Status: DC | PRN
  Filled 2017-11-18: qty 30

## 2017-11-18 MED ORDER — SODIUM CHLORIDE 0.9 % IV SOLN: 100.0000 mL | INTRAVENOUS | Status: DC | PRN

## 2017-11-18 MED ORDER — TRAMADOL HCL 50 MG PO TABS
50.0000 mg | ORAL_TABLET | Freq: Two times a day (BID) | ORAL | Status: DC | PRN
  Administered 2017-11-18 – 2017-11-23 (×2): 50 mg via ORAL
  Filled 2017-11-18 (×2): qty 1

## 2017-11-18 MED ORDER — SODIUM CHLORIDE 0.9 % IV SOLN
Freq: Once | INTRAVENOUS | Status: AC
  Administered 2017-11-18: 04:00:00 via INTRAVENOUS

## 2017-11-18 MED ORDER — ACETAMINOPHEN 650 MG RE SUPP: 650.0000 mg | Freq: Four times a day (QID) | RECTAL | Status: DC | PRN

## 2017-11-18 MED ORDER — ENSURE ENLIVE PO LIQD
237.0000 mL | Freq: Two times a day (BID) | ORAL | Status: DC
  Administered 2017-11-18 – 2017-11-23 (×8): 237 mL via ORAL

## 2017-11-18 MED ORDER — HYDROCODONE-ACETAMINOPHEN 5-325 MG PO TABS
1.0000 | ORAL_TABLET | ORAL | Status: DC | PRN
  Administered 2017-11-20 – 2017-11-22 (×2): 1 via ORAL
  Filled 2017-11-18 (×2): qty 1

## 2017-11-18 MED ORDER — BENZONATATE 100 MG PO CAPS
100.0000 mg | ORAL_CAPSULE | Freq: Three times a day (TID) | ORAL | Status: DC
  Administered 2017-11-18 – 2017-11-23 (×14): 100 mg via ORAL
  Filled 2017-11-18 (×15): qty 1

## 2017-11-18 MED ORDER — ONDANSETRON HCL 4 MG/2ML IJ SOLN: 4.0000 mg | Freq: Four times a day (QID) | INTRAMUSCULAR | Status: DC | PRN

## 2017-11-18 MED ORDER — ONDANSETRON HCL 4 MG PO TABS: 4.0000 mg | ORAL_TABLET | Freq: Four times a day (QID) | ORAL | Status: DC | PRN

## 2017-11-18 MED ORDER — LANTHANUM CARBONATE 500 MG PO CHEW
1000.0000 mg | CHEWABLE_TABLET | Freq: Three times a day (TID) | ORAL | Status: DC
  Administered 2017-11-18 – 2017-11-19 (×4): 1000 mg via ORAL
  Filled 2017-11-18 (×5): qty 2

## 2017-11-18 MED ORDER — FINASTERIDE 5 MG PO TABS
5.0000 mg | ORAL_TABLET | Freq: Every day | ORAL | Status: DC
  Administered 2017-11-18 – 2017-11-23 (×6): 5 mg via ORAL
  Filled 2017-11-18 (×6): qty 1

## 2017-11-18 MED ORDER — EZETIMIBE 10 MG PO TABS
10.0000 mg | ORAL_TABLET | Freq: Every day | ORAL | Status: DC
  Administered 2017-11-18 – 2017-11-23 (×6): 10 mg via ORAL
  Filled 2017-11-18 (×6): qty 1

## 2017-11-18 MED ORDER — FENOFIBRATE 160 MG PO TABS
160.0000 mg | ORAL_TABLET | Freq: Every day | ORAL | Status: DC
  Administered 2017-11-18 – 2017-11-23 (×6): 160 mg via ORAL
  Filled 2017-11-18 (×6): qty 1

## 2017-11-18 MED ORDER — INSULIN ASPART 100 UNIT/ML ~~LOC~~ SOLN
3.0000 [IU] | Freq: Three times a day (TID) | SUBCUTANEOUS | Status: DC
Start: 2017-11-18 — End: 2017-11-24
  Administered 2017-11-18 – 2017-11-23 (×9): 3 [IU] via SUBCUTANEOUS

## 2017-11-18 MED ORDER — INSULIN ASPART 100 UNIT/ML ~~LOC~~ SOLN
0.0000 [IU] | Freq: Every day | SUBCUTANEOUS | Status: DC
  Administered 2017-11-18: 3 [IU] via SUBCUTANEOUS

## 2017-11-18 MED ORDER — RENA-VITE PO TABS
1.0000 | ORAL_TABLET | Freq: Every day | ORAL | Status: DC
  Administered 2017-11-18 – 2017-11-22 (×5): 1 via ORAL
  Filled 2017-11-18 (×5): qty 1

## 2017-11-18 MED ORDER — INSULIN ASPART 100 UNIT/ML ~~LOC~~ SOLN
0.0000 [IU] | Freq: Three times a day (TID) | SUBCUTANEOUS | Status: DC
  Administered 2017-11-18 – 2017-11-19 (×2): 2 [IU] via SUBCUTANEOUS
  Administered 2017-11-19: 1 [IU] via SUBCUTANEOUS
  Administered 2017-11-20: 2 [IU] via SUBCUTANEOUS
  Administered 2017-11-20: 7 [IU] via SUBCUTANEOUS
  Administered 2017-11-21 – 2017-11-22 (×2): 2 [IU] via SUBCUTANEOUS
  Administered 2017-11-22: 3 [IU] via SUBCUTANEOUS
  Administered 2017-11-22 – 2017-11-23 (×3): 1 [IU] via SUBCUTANEOUS
  Administered 2017-11-23: 2 [IU] via SUBCUTANEOUS

## 2017-11-18 MED ORDER — ACETAMINOPHEN 500 MG PO TABS: 500.0000 mg | ORAL_TABLET | Freq: Every day | ORAL | Status: DC | PRN

## 2017-11-18 MED ORDER — PREDNISONE 20 MG PO TABS
20.0000 mg | ORAL_TABLET | Freq: Every day | ORAL | Status: DC
  Administered 2017-11-18 – 2017-11-24 (×6): 20 mg via ORAL
  Filled 2017-11-18 (×8): qty 1

## 2017-11-18 MED ORDER — CALCIUM ACETATE (PHOS BINDER) 667 MG PO CAPS
1334.0000 mg | ORAL_CAPSULE | Freq: Three times a day (TID) | ORAL | Status: DC
  Administered 2017-11-18 – 2017-11-19 (×4): 1334 mg via ORAL
  Filled 2017-11-18 (×5): qty 2

## 2017-11-18 NOTE — Progress Notes (Signed)
Blue/gray skin discoloration on low back center area 3.5 x 2.5.

## 2017-11-18 NOTE — Progress Notes (Signed)
NIF with 2 attempts > -40

## 2017-11-18 NOTE — Progress Notes (Signed)
Arrived from Hemodialysis,placed in telemetry bed.Awake,alert and oriented x 1.Wife at the bedside.Multiple wounds on his bilateral legs and feet,assessed,measured and treated by [place] R.N.Speech therapy notified for evaluation and treatment of his swallowing.

## 2017-11-18 NOTE — Progress Notes (Signed)
[facility]                                                                              Patient Demographics  James David, is a 82 y.o. male, DOB - [birth year], YIR:[id number]  Admit date - 11/17/2017   Admitting Physician [name], MD  Outpatient Primary MD for the patient is James Cruel, MD  Outpatient specialists:   LOS - 1  days   Medical records reviewed and are as summarized below:    Chief Complaint  Patient presents with  . Weakness       Brief summary   Patient is a 82 year old male with ESRD on HD, MWF, atrial fibrillation on Coumadin, history of myasthenia gravis on prednisone, CAD, history of CVA, second-degree heart block with pacemaker, diabetes mellitus presented to ED with worsening weakness. Patient reported that he has noticed worsening weakness in the last 3 weeks. He was just discharged home on 2/27, was treated for progressive weakness, cellulitis of his right foot on doxycycline and IV Fortaz with hemodialysis for 6 weeks. Patient was seen by vascular surgery who recommended bilateral AKA for bilateral heel ulcers, left great toe, right anterior foot and outer ankle wounds, nonhealing however patient refused. He was seen by psychiatry and patient was deemed as unable to have capacity to make decisions. Patient's spouse understood overall poor prognosis however did not wish to pursue medication either.  At baseline, patient reports that he is unable to ambulate and uses wheelchair. On the day of admission, was found to have increased generalized weakness by his [facility] RN and he was unable to transfer to his wheelchair. He was seen by neurology in ED and recommended IVIG for acute myasthenia gravis exacerbation.  Assessment & Plan   Myasthenia gravis in crisis [facility]) - Patient has been evaluated by neurology and feel that his symptoms are likely related to myasthenia gravis crisis -  neurology recommended IVIG for 5 days, continue  prednisone 20 mg daily, no need for stress dose steroids .  -  IVIG day #2 today  Active Problems:   ESRD (end stage renal disease) on dialysis ([place]) - Normal dialysis schedule MW F, renal consulted, HD today    Diabetes mellitus due to underlying condition with diabetic nephropathy ([place]), uncontrolled -  hemoglobin A1c 8.1  -Continue sliding scale insulin while inpatient, hold Amaryl - Add NovoLog meal coverage 3 units 3 times a day before meals    Chronic diastolic heart failure ([place]) - Currently compensated, volume management with HD   - 2-D echo 03/2017 showed EF of 50-55%  Pressure injury of the skin with bilateral heel ulcers, ankle wound, left great toe wound - Patient was seen by vascular surgery during the previous admission who recommended bilateral AKA for bilateral heel ulcers, left great toe, right anterior foot and outer ankle wounds, nonhealing however patient refused. He was seen by psychiatry and patient was deemed as unable to have capacity to make decisions. Patient's spouse understood overall poor prognosis however did not wish to pursue medication either. - Discussed with neurology, recommended no vancomycin or doxycycline as it may exacerbate myasthenia  gravis -  continue IV Ancef, wound care consulted.  - Follow blood culture  Acute on chronic anemia - Anemia of chronic disease due to his ESRD however patient had bleeding from his elbow yesterday, supratherapeutic INR, hemoglobin 7.9. - Transfuse 1 unit packed RBC during dialysis.    Atrial fibrillation, permanent (H[facility]) - Currently rate controlled, continue Coreg  - INR supratherapeutic 5.9 at the time of admission with bleeding from the right elbow abrasion, received vitamin K 2.5 mg PO X1 - Recheck PT/INR today, Coumadin per pharmacy    Dyslipidemia - Continue Zetia, fenofibrate     Dysphagia -  continue renal diet, SLP evaluation    Generalized weakness - PT OT evaluation once more  stable   Code Status: Full CODE STATUS DVT Prophylaxis:  Warfarin on hold Family Communication: Discussed in detail with the patient, all imaging results, lab results explained to the patient    Disposition Plan: Needs IVIG for total 5 days  Time Spent in minutes   35 minutes  Procedures:  Hemodialysis  Consultants:   Neurology Nephrology  Antimicrobials:   IV Ancef 3/4   Medications  Scheduled Meds: . benzonatate  100 mg Oral TID  . calcium acetate  160-1,093 mg Oral See admin instructions  . collagenase   Topical Daily  . ENSURE  1 Can Oral BID BM  . ezetimibe  10 mg Oral Daily  . fenofibrate  160 mg Oral Daily  . finasteride  5 mg Oral Daily  . folic acid-vitamin b complex-vitamin c-selenium-zinc  1 tablet Oral QHS  . Immune Globulin 10%  400 mg/kg Intravenous Q24 Hr x 5  . insulin aspart  0-5 Units Subcutaneous QHS  . insulin aspart  0-9 Units Subcutaneous TID WC  . lanthanum  1,000 mg Oral TID WC  . predniSONE  20 mg Oral Q breakfast   Continuous Infusions: . sodium chloride    .  ceFAZolin (ANCEF) IV Stopped (11/17/17 1907)   PRN Meds:.sodium chloride, acetaminophen **OR** acetaminophen, acetaminophen, HYDROcodone-acetaminophen, lidocaine-prilocaine, ondansetron **OR** ondansetron (ZOFRAN) IV, pentafluoroprop-tetrafluoroeth, traMADol   Antibiotics   Anti-infectives (From admission, onward)   Start     Dose/Rate Route Frequency Ordered Stop   11/17/17 1800  ceFAZolin (ANCEF) IVPB 1 g/50 mL premix     1 g 100 mL/hr over 30 Minutes Intravenous Every 24 hours 11/17/17 1751          Subjective:   James David was seen and examined today. States still feels weak and not a whole lot of improvement. No fevers or chills. Seen during hemodialysis. Denies any pain. Patient denies dizziness, chest pain, shortness of breath, abdominal pain, N/V/D/C. No acute events overnight.    Objective:   Vitals:   11/18/17 0700 11/18/17 0730 11/18/17 0800 11/18/17 0809   BP: (!) 151/61 (!) 159/67 (!) 158/61   Pulse: 80 80 76   Resp: 19 19 (!) 21   Temp:    97.6 F (36.4 C)  TempSrc:    Oral  SpO2: 100% 100% 100%     Intake/Output Summary (Last 24 hours) at 11/18/2017 1103 Last data filed at 11/17/2017 1857 Gross per 24 hour  Intake 0 ml  Output -  Net 0 ml     Wt Readings from Last 3 Encounters:  11/12/17 73.8 kg (162 lb 11.2 oz)  10/22/17 77.1 kg (170 lb)  10/21/17 80.3 kg (177 lb)     Exam  General: Alert and oriented x 3, NAD  Eyes:   HEENT:  Atraumatic, normocephalic  Cardiovascular: S1 S2 auscultated, RRR, , no pedal edema bilaterally  Respiratory: Clear to auscultation bilaterally, no wheezing, rales or rhonchi  Gastrointestinal: Soft, nontender, nondistended, + bowel sounds  Ext: no pedal edema bilaterally  Neuro: no new deficits  Musculoskeletal: No digital cyanosis, clubbing  Skin: No rashes  Psych: Normal affect and demeanor, alert and oriented x3    Data Reviewed:  I have personally reviewed following labs and imaging studies  Micro Results Recent Results (from the past 240 hour(s))  Blood Culture (routine x 2)     Status: None (Preliminary result)   Collection Time: 11/17/17  1:45 PM  Result Value Ref Range Status   Specimen Description BLOOD LEFT HAND  Final   Special Requests   Final    IN PEDIATRIC BOTTLE Blood Culture adequate volume Performed at [facility] [address] [place], [place] [address]    Culture PENDING  Incomplete   Report Status PENDING  Incomplete    Radiology Reports Dg Chest 2 View  Result Date: 11/04/2017 CLINICAL DATA:  Fatigue, weakness, history of myasthenia gravis EXAM: CHEST  2 VIEW COMPARISON:  07/27/2017 FINDINGS: Surgical clips over the left chest. Left-sided pacing device, similar compared to previous exam. No significant pleural effusion. No acute consolidation. Stable enlarged cardiomediastinal silhouette with aortic atherosclerosis. No pneumothorax. Vascular  stent in the right axillary region. Flowing osteophytes of the spine. IMPRESSION: No active cardiopulmonary disease.  Cardiomegaly. Electronically Signed   By: Donavan Foil M.D.   On: 11/04/2017 18:13   Dg Ankle 2 Views Left  Result Date: 11/04/2017 CLINICAL DATA:  Ankle pain EXAM: LEFT ANKLE - 2 VIEW COMPARISON:  None. FINDINGS: Only a single lateral view of the ankle is submitted. According to the notes, patient unable to hold position for AP view. Large plantar calcaneal spur and enthesophytes. Vascular calcification. No obvious acute abnormality IMPRESSION: 1. Limited single lateral view shows no acute abnormality 2. Large plantar calcaneal spur. Electronically Signed   By: Donavan Foil M.D.   On: 11/04/2017 18:42   Dg Ankle Complete Right  Result Date: 11/04/2017 CLINICAL DATA:  Worsening foot wounds EXAM: RIGHT ANKLE - COMPLETE 3+ VIEW COMPARISON:  06/28/2016 FINDINGS: No acute displaced fracture or malalignment is seen. Possible small erosion at the fibular malleolar tip on one view only. There is lucency and thinning of the soft tissues over the fibular malleolus suspected to represent an ulcer. Mild degenerative changes of the medial ankle joint. Vascular calcifications. Large calcaneal spur and enthesophyte. Soft tissue thickening posterior calcaneal region. No soft tissue gas IMPRESSION: 1. Questionable small cortical erosion/focus of bone infection at the lateral fibular malleolar region on one view; there is thinning of the soft tissues and a lucency overlying the fibular malleolus, presumably representing an ulcer or wound. 2. Large plantar calcaneal spurs. Electronically Signed   By: Donavan Foil M.D.   On: 11/04/2017 18:19   Ct Head Wo Contrast  Result Date: 11/17/2017 CLINICAL DATA:  82 year old male on dialysis presenting with generalized weakness starting yesterday. Missed dialysis today. Initial encounter. EXAM: CT HEAD WITHOUT CONTRAST TECHNIQUE: Contiguous axial images were  obtained from the base of the skull through the vertex without intravenous contrast. COMPARISON:  03/19/2017 CT. FINDINGS: Brain: No intracranial hemorrhage or CT evidence of large acute infarct. Remote small infarct right lenticular nucleus/caudate. Chronic microvascular changes. Global atrophy. No intracranial mass lesion noted on this unenhanced exam. Vascular: Prominent vascular calcifications. Skull: Negative Sinuses/Orbits: No acute orbital abnormality. Minimal  polypoid opacification anterior right sphenoid sinus. Remainder of visualized paranasal sinuses are clear. Other: Mastoid air cells and middle ear cavities are clear. IMPRESSION: No acute intracranial abnormality noted. Remote small infarct right lenticular nucleus/caudate. Electronically Signed   By: Genia Del M.D.   On: 11/17/2017 18:04   Dg Chest Port 1 View  Result Date: 11/17/2017 CLINICAL DATA:  Cough and weakness for 1 day. History of CHF, end-stage renal disease on dialysis. EXAM: PORTABLE CHEST 1 VIEW COMPARISON:  Chest radiograph November 04, 2017 FINDINGS: Cardiac silhouette is mildly enlarged and unchanged. Mediastinal silhouette is non suspicious. Calcified aortic knob. Small LEFT pleural effusion, RIGHT costophrenic angle out of field-of-view. Pulmonary vasculature appears normal. No focal consolidation. No pneumothorax. RIGHT subclavian vascular stent. Dual lead LEFT cardiac pacemaker in situ. ACDF. Surgical clips LEFT axilla. Advanced RIGHT acromioclavicular osteoarthrosis. IMPRESSION: Small LEFT pleural effusion.  Stable cardiomegaly. Aortic Atherosclerosis (ICD10-I70.0). Electronically Signed   By: Elon Alas M.D.   On: 11/17/2017 15:03   Dg Foot Complete Left  Result Date: 11/04/2017 CLINICAL DATA:  Bilateral foot wounds right and left heel EXAM: LEFT FOOT - COMPLETE 3+ VIEW COMPARISON:  None. FINDINGS: No acute fracture or malalignment. Moderate arthritis at the first MTP joint. Screw fixation of the head of fifth  metatarsal with chronic deformity noted. Vascular calcifications. Large calcaneal spurs and enthesophyte. No soft tissue gas. No radiopaque foreign body IMPRESSION: 1. No acute osseous abnormality 2. Postsurgical changes of the distal fifth metatarsal with evidence of old fracture deformity 3. Large calcaneal spurs Electronically Signed   By: Donavan Foil M.D.   On: 11/04/2017 18:15   Dg Foot Complete Right  Result Date: 11/04/2017 CLINICAL DATA:  Foot wounds EXAM: RIGHT FOOT COMPLETE - 3+ VIEW COMPARISON:  None. FINDINGS: No acute displaced fracture or malalignment is seen. Extensive vascular calcifications. Possible old fracture deformity involving the proximal shaft and base of the third metatarsal. Large plantar calcaneal spur. No soft tissue gas. IMPRESSION: 1. No acute osseous abnormality 2. Large plantar calcaneal spur Electronically Signed   By: Donavan Foil M.D.   On: 11/04/2017 18:23    Lab Data:  CBC: Recent Labs  Lab 11/12/17 0825 11/17/17 1345 11/17/17 1353 11/18/17 0123  WBC 11.6* 14.1*  --  11.1*  NEUTROABS  --  13.0*  --   --   HGB 8.8* 8.2* 10.5* 7.9*  HCT 28.4* 27.9* 31.0* 26.0*  MCV 101.4* 100.0  --  99.2  PLT 377 335  --  660   Basic Metabolic Panel: Recent Labs  Lab 11/12/17 0820 11/17/17 1345 11/17/17 1353 11/18/17 0123  NA 134* 132* 131* 131*  K 4.0 4.5 4.4 4.4  CL 97* 91* 91* 92*  CO2 22 24  --  23  GLUCOSE 94 276* 261* 183*  BUN 48* 83* 83* 90*  CREATININE 5.59* 8.36* 8.50* 8.76*  CALCIUM 9.1 9.3  --  9.0  PHOS 4.8* 2.9  --   --    GFR: Estimated Creatinine Clearance: 7.1 mL/min (A) (by C-G formula based on SCr of 8.76 mg/dL (H)). Liver Function Tests: Recent Labs  Lab 11/12/17 0820 11/17/17 1345  AST  --  22  ALT  --  30  ALKPHOS  --  50  BILITOT  --  0.8  PROT  --  5.9*  ALBUMIN 2.1* 2.2*   No results for input(s): LIPASE, AMYLASE in the last 168 hours. No results for input(s): AMMONIA in the last 168 hours. Coagulation  Profile: Recent Labs  Lab 11/12/17 0418 11/17/17 1530  INR 3.38 5.99*   Cardiac Enzymes: No results for input(s): CKTOTAL, CKMB, CKMBINDEX, TROPONINI in the last 168 hours. BNP (last 3 results) No results for input(s): PROBNP in the last 8760 hours. HbA1C: Recent Labs    11/18/17 0121  HGBA1C 8.1*   CBG: Recent Labs  Lab 11/11/17 1636 11/11/17 2200 11/12/17 1621 11/18/17 0224 11/18/17 0807  GLUCAP 225* 109* 253* 149* 92   Lipid Profile: No results for input(s): CHOL, HDL, LDLCALC, TRIG, CHOLHDL, LDLDIRECT in the last 72 hours. Thyroid Function Tests: No results for input(s): TSH, T4TOTAL, FREET4, T3FREE, THYROIDAB in the last 72 hours. Anemia Panel: No results for input(s): VITAMINB12, FOLATE, FERRITIN, TIBC, IRON, RETICCTPCT in the last 72 hours. Urine analysis:    Component Value Date/Time   COLORURINE AMBER (A) 11/05/2017 1902   APPEARANCEUR TURBID (A) 11/05/2017 1902   LABSPEC 1.013 11/05/2017 1902   PHURINE 7.0 11/05/2017 1902   GLUCOSEU >=500 (A) 11/05/2017 1902   HGBUR SMALL (A) 11/05/2017 1902   BILIRUBINUR NEGATIVE 11/05/2017 1902   KETONESUR NEGATIVE 11/05/2017 1902   PROTEINUR 100 (A) 11/05/2017 1902   NITRITE NEGATIVE 11/05/2017 1902   LEUKOCYTESUR MODERATE (A) 11/05/2017 1902     Ripudeep Rai M.D. [facility] 11/18/2017, 11:03 AM  Pager: [phone] Between 7am to 7pm - call Pager - [phone]  After 7pm go to [email or web address] - password [email or web address]  Call night coverage person covering after 7pm

## 2017-11-18 NOTE — Evaluation (Signed)
Clinical/Bedside Swallow Evaluation Patient Details  Name: James David MRN: [id number] Date of Birth: 1936-05-16  Today's Date: 11/18/2017 Time: SLP Start Time (ACUTE ONLY): 55 SLP Stop Time (ACUTE ONLY): 1600 SLP Time Calculation (min) (ACUTE ONLY): 20 min  Past Medical History:  Past Medical History:  Diagnosis Date  . Anemia 07/28/2017  . Atrial fibrillation ([place])    on Coumadin  . CHF (congestive heart failure) ([place])   . DDD (degenerative disc disease)   . Diabetes mellitus without complication ([place])   . ESRD (end stage renal disease) on dialysis ([place])   . Hyperlipidemia   . Hypertension   . Melanoma ([place]) 2010   removed at [facility]  . Second degree heart block    a. s/p STJ dual chamber PPM - Dr [name]  . Stroke [facility]) 01/2017   Past Surgical History:  Past Surgical History:  Procedure Laterality Date  . A/V FISTULAGRAM N/A 06/11/2017   Procedure: A/V Fistulagram;  Surgeon: Waynetta Sandy, MD;  Location: [place] CV LAB;  Service: Cardiovascular;  Laterality: N/A;  . ABDOMINAL AORTOGRAM N/A 10/22/2017   Procedure: ABDOMINAL AORTOGRAM;  Surgeon: Elam Dutch, MD;  Location: [place] CV LAB;  Service: Cardiovascular;  Laterality: N/A;  . AV FISTULA PLACEMENT Right 05/31/2014   Procedure: Right Arm Brachiocephalic ARTERIOVENOUS FISTULA CREATION  ;  Surgeon: Conrad [place], MD;  Location: [place];  Service: Vascular;  Laterality: Right;  . CATARACT EXTRACTION W/ INTRAOCULAR LENS  IMPLANT, BILATERAL    . COLONOSCOPY    . EXCISION MELANOMA WITH SENTINEL LYMPH NODE BIOPSY Right 02/08/2016   Procedure: WIDE EXCISION RIGHT SHOULDER MELANOMA WITH RIGHT SENTINEL LYMPH NODE BIOPSY;  Surgeon: [name], MD;  Location: [place];  Service: General;  Laterality: Right;  . LAMINECTOMY    . LOWER EXTREMITY ANGIOGRAPHY Bilateral 10/22/2017   Procedure: Lower Extremity Angiography;  Surgeon: Elam Dutch, MD;  Location: [place] CV LAB;  Service: Cardiovascular;   Laterality: Bilateral;  . PERIPHERAL VASCULAR BALLOON ANGIOPLASTY Right 06/11/2017   Procedure: PERIPHERAL VASCULAR BALLOON ANGIOPLASTY;  Surgeon: Waynetta Sandy, MD;  Location: [place] CV LAB;  Service: Cardiovascular;  Laterality: Right;  arm fistula  . PERMANENT PACEMAKER INSERTION N/A 01/20/2014   STJ Assurity dual chamber pacemaker implanted by Dr [name] for 2nd degree AV block   HPI:  Patient is a 82 year old male with ESRD on HD, MWF, atrial fibrillation on Coumadin, history of myasthenia gravis on prednisone, CAD, history of CVA, second-degree heart block with pacemaker, diabetes mellitus presented to ED with worsening weakness. Patient reported that he has noticed worsening weakness in the last 3 weeks. At baseline, patient reports that he is unable to ambulate and uses wheelchair. Today he was found to have increased generalized weakness by his home health nurse and he was unable to transfer to his wheelchair. Patient also reported that he had difficulty keeping his head up due to weakness and did not go for his regular HD today. Patient reported a week of cough and congestion but no fevers chills, no significant sputum production.   Assessment / Plan / Recommendation Clinical Impression   Pt presents with surprisingly functional oropharyngeal swallowing function when hx of myasthenia gravis, previous CVA, and ACDF are taken into account.  Pt has mild generalized oral motor weakness which does not appear to functionally impact containment, transit, and clearance of boluses regardless of viscosity.  Additionally, pt presents with at most delayed throat clearing/coughing following mixed solids consistencies.  Throat  clearing/coughing occurred both with and without PO intake and was delayed to the point where it was difficult to determine if it was even related to POs.  Would recommend that pt remain on Regular solids and thin liquids with close SLP follow up for toleration at bedside  versus possible MBS given his extensive medical history.   SLP Visit Diagnosis: Dysphagia, unspecified (R13.10)    Aspiration Risk  Mild aspiration risk;Moderate aspiration risk    Diet Recommendation Regular;Thin liquid   Liquid Administration via: Cup;Straw Medication Administration: Whole meds with liquid Supervision: Patient able to self feed Compensations: Slow rate;Small sips/bites;Follow solids with liquid Postural Changes: Seated upright at 90 degrees;Remain upright for at least 30 minutes after po intake    Other  Recommendations Oral Care Recommendations: Oral care BID   Follow up Recommendations Other (comment)(TBD)      Frequency and Duration min 2x/week          Prognosis Barriers to Reach Goals: Cognitive deficits      Swallow Study   General HPI: Patient is a 82 year old male with ESRD on HD, MWF, atrial fibrillation on Coumadin, history of myasthenia gravis on prednisone, CAD, history of CVA, second-degree heart block with pacemaker, diabetes mellitus presented to ED with worsening weakness. Patient reported that he has noticed worsening weakness in the last 3 weeks. At baseline, patient reports that he is unable to ambulate and uses wheelchair. Today he was found to have increased generalized weakness by his home health nurse and he was unable to transfer to his wheelchair. Patient also reported that he had difficulty keeping his head up due to weakness and did not go for his regular HD today. Patient reported a week of cough and congestion but no fevers chills, no significant sputum production. Type of Study: Bedside Swallow Evaluation Previous Swallow Assessment: MBS 03/2017, recommending Dys 2, thin liquids Diet Prior to this Study: Regular;Thin liquids Temperature Spikes Noted: No Respiratory Status: Room air History of Recent Intubation: No Behavior/Cognition: Alert;Cooperative;Pleasant mood;Confused Oral Cavity Assessment: Within Functional Limits Oral Care  Completed by SLP: Yes Oral Cavity - Dentition: Adequate natural dentition Vision: Functional for self-feeding Self-Feeding Abilities: Able to feed self Patient Positioning: Upright in bed Baseline Vocal Quality: Normal Volitional Cough: Congested Volitional Swallow: Able to elicit    Oral/Motor/Sensory Function Overall Oral Motor/Sensory Function: Generalized oral weakness(mild)   Ice Chips     Thin Liquid Thin Liquid: Within functional limits    Nectar Thick     Honey Thick     Puree Puree: Within functional limits   Solid   GO   Solid: Impaired Presentation: Self Fed Pharyngeal Phase Impairments: Throat Clearing - Delayed        Jaimeson Gopal, Selinda Orion 11/18/2017,4:07 PM

## 2017-11-18 NOTE — ED Notes (Signed)
Hospitalist paged to update on hemoglobin results. Type and Screen collected.

## 2017-11-18 NOTE — Progress Notes (Signed)
Pt performed NIF with good effort (-40). VS within normal limits. Bilateral Clear/dim BS. Pt denies SOB, no increased WOB.

## 2017-11-18 NOTE — Progress Notes (Signed)
[facility] Progress Note  Subjective: still confused, but interacting  Vitals:   11/18/17 1300 11/18/17 1330 11/18/17 1356 11/18/17 1520  BP: (!) 131/59 (!) 141/94 122/80 (!) 142/54  Pulse: 88 92 93 86  Resp:  15 (!) 21 18  Temp:   97.7 F (36.5 C) 98.1 F (36.7 C)  TempSrc:   Oral Oral  SpO2:   96% 100%  Weight:   73.8 kg (162 lb 11.2 oz)     Inpatient medications: . benzonatate  100 mg Oral TID  . calcium acetate  1,334 mg Oral TID WC  . [START ON 11/19/2017] collagenase   Topical Daily  . ezetimibe  10 mg Oral Daily  . feeding supplement (ENSURE ENLIVE)  237 mL Oral BID BM  . fenofibrate  160 mg Oral Daily  . finasteride  5 mg Oral Daily  . Immune Globulin 10%  400 mg/kg Intravenous Q24 Hr x 5  . insulin aspart  0-5 Units Subcutaneous QHS  . insulin aspart  0-9 Units Subcutaneous TID WC  . insulin aspart  3 Units Subcutaneous TID WC  . lanthanum  1,000 mg Oral TID WC  . multivitamin  1 tablet Oral QHS  . predniSONE  20 mg Oral Q breakfast   .  ceFAZolin (ANCEF) IV Stopped (11/17/17 1907)     Exam: General: alert, no distress, responsive Heart: RRR, noi m,r,g  Lungs: No JVD  CTA bilat Abdomen: soft , nt, nd Extremities: no edema noted lower extrem, multiple skin ulcers  Dialysis Access: RUA AVF pos bruit    Home meds: - phoslo ac/ zetia/ fenofibrate/ultram prn/ fosrenol ac/ ecasa/ proscar/ MVI - coumadin - pred 20 qd - fortaz IV at HD 2 gm tiw - doxy 100 bid  - coreg 25 bid - amaryl 1-2 mg qam    Dialysis:[facility] MWF 4h 2/2.25 P2 Hep none RUA AVF  Assessment:  1Myasthenia gravis - progressive debility and gen'd weakness.  Per neuro, considering course of IVIG.   2  Bilat foot ulcers/ R lat malleolus osteo - vascular surg rec'd bilat amputation last admission but patient declined 3 ESRD HD MWF 4  Afib - on coumadin, BB 5 Vol - no excess on exam 6 Anemia ckd - cont esa as needed, next dose due 2/27 7. MBD ckd - check  phos, hold binders for now ,  Plan - HD today and again tomorrow to get back on schedule, get vol down as tolerated   Kelly Splinter MD [place] pager [phone]   11/18/2017, 4:52 PM   Recent Labs  Lab 11/12/17 0820 11/17/17 1345 11/17/17 1353 11/18/17 0123  NA 134* 132* 131* 131*  K 4.0 4.5 4.4 4.4  CL 97* 91* 91* 92*  CO2 22 24  --  23  GLUCOSE 94 276* 261* 183*  BUN 48* 83* 83* 90*  CREATININE 5.59* 8.36* 8.50* 8.76*  CALCIUM 9.1 9.3  --  9.0  PHOS 4.8* 2.9  --   --    Recent Labs  Lab 11/12/17 0820 11/17/17 1345  AST  --  22  ALT  --  30  ALKPHOS  --  50  BILITOT  --  0.8  PROT  --  5.9*  ALBUMIN 2.1* 2.2*   Recent Labs  Lab 11/12/17 0825 11/17/17 1345 11/17/17 1353 11/18/17 0123  WBC 11.6* 14.1*  --  11.1*  NEUTROABS  --  13.0*  --   --   HGB 8.8* 8.2* 10.5* 7.9*  HCT 28.4* 27.9* 31.0* 26.0*  MCV 101.4* 100.0  --  99.2  PLT 377 335  --  326   Iron/TIBC/Ferritin/ %Sat    Component Value Date/Time   IRON 26 (L) 03/18/2017 1017   TIBC 211 (L) 03/18/2017 1017   FERRITIN 457 (H) 03/18/2017 1017   IRONPCTSAT 12 (L) 03/18/2017 1017

## 2017-11-18 NOTE — Progress Notes (Addendum)
Subjective: Seen in HD. He feels lousy and reports no change in his weakness. He feels confused.  Exam: Vitals:   11/18/17 1330 11/18/17 1356  BP: (!) 141/94 122/80  Pulse: 92 93  Resp: 15 (!) 21  Temp:  97.7 F (36.5 C)  SpO2:  96%   Gen: Chronically ill-appearing, In bed, NAD, receiving dialysis via RUE AVF Resp: non-labored breathing, no acute distress Abd: soft, nt Extremities: muscle wasting throughout, right hip externally rotated with knee flexed  Neuro: Exam limited due to receiving dialysis and cooperation MS: alert to person and place but not time CN: PERRL, EOMI  Motor: moves all extremities spontaneously but not consistently following commands, neck flexion against resistance improved compared to admission, will raise LLE off bed against gravity for few seconds. Hip flexion 4/5 against resistance bilaterally, rest of exam limited due to cooperation. DTR: hyperreflexia - left patellar reflex elicits right sided hip adductor  Pertinent Labs: BMET    Component Value Date/Time   NA 131 (L) 11/18/2017 0123   NA 140 02/28/2017 0916   NA 138 10/17/2016 1303   K 4.4 11/18/2017 0123   K 5.1 10/17/2016 1303   CL 92 (L) 11/18/2017 0123   CO2 23 11/18/2017 0123   CO2 18 (L) 10/17/2016 1303   GLUCOSE 183 (H) 11/18/2017 0123   GLUCOSE 197 (H) 10/17/2016 1303   BUN 90 (H) 11/18/2017 0123   BUN 46 (H) 02/28/2017 0916   BUN 71.0 (H) 10/17/2016 1303   CREATININE 8.76 (H) 11/18/2017 0123   CREATININE 5.9 (HH) 10/17/2016 1303   CALCIUM 9.0 11/18/2017 0123   CALCIUM 8.9 10/17/2016 1303   GFRNONAA 5 (L) 11/18/2017 0123   GFRAA 6 (L) 11/18/2017 0123   CBC    Component Value Date/Time   WBC 11.1 (H) 11/18/2017 0123   RBC 2.62 (L) 11/18/2017 0123   HGB 7.9 (L) 11/18/2017 0123   HGB 12.0 (L) 10/17/2016 1303   HCT 26.0 (L) 11/18/2017 0123   HCT 34.4 (L) 10/17/2016 1303   PLT 326 11/18/2017 0123   PLT 198 10/17/2016 1303   MCV 99.2 11/18/2017 0123   MCV 86.6 10/17/2016  1303   MCH 30.2 11/18/2017 0123   MCHC 30.4 11/18/2017 0123   RDW 17.2 (H) 11/18/2017 0123   RDW 13.3 10/17/2016 1303   LYMPHSABS 0.6 (L) 11/17/2017 1345   LYMPHSABS 1.0 10/17/2016 1303   MONOABS 0.5 11/17/2017 1345   MONOABS 0.5 10/17/2016 1303   EOSABS 0.0 11/17/2017 1345   EOSABS 0.2 10/17/2016 1303   BASOSABS 0.0 11/17/2017 1345   BASOSABS 0.0 10/17/2016 1303   Hgb A1c 8.1  Impression:  82 y.o. 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 Started on IVIG 11/17/17.  Recommendations: 1) Continue IVIG 400 mg/kg q24h x 5 days (day 2 of 5) 2) Continue Prednisone 20 mg daily 3) CT head WO contrast negative for acute infarct 4) HD per Nephrology - In HD off schedule today 5) Antibiotics narrowed to Cefazolin per primary 5) Recommend PT for RLE/early contracture   [name], MD Internal Medicine PGY-3

## 2017-11-18 NOTE — ED Notes (Signed)
Hospitalist re-paged.

## 2017-11-18 NOTE — Progress Notes (Signed)
Patient performed NIF -40 with good patient effort.

## 2017-11-18 NOTE — Consult Note (Signed)
[facility] Nurse wound consult note Reason for Consult: bilateral heel wounds Wound type: Likely mixed etiology of pressure, diabetes, and poor blood flow Pressure Injury POA: Yes Measurements:  Dorsum of right foot/ankle junction:  5.5cm x 4cm DTI Right first metatarsal head, medial location:  2 cm x 1 cm purple area, evolving DTI Dorsum of right 2nd toe: purple in color, evolving DTI Right posterior heel:  5 cm x 8 cm, dried black eschar Right lateral foot:  10 CM X 4 CM, dried black eschar Right lateral ankle:  4 cm x 4 cm x 2 cm, bone exposed, dry wound bed, no drainage, no odor  Bilateral lower legs have thin, shiny, hairless skin and is cool to touch, consistent with arterial insufficiency.  Left great toe tip and dorsum of toe is dry and black Left lateral 5th metatarsal head:  2.5 cm x 3 cm, DTI Left lateral midfoot:  1.7 cm x 1 cm, evolving DTI Left heel:  3 cm x 3 cm, dry black eschar Left poster lateral malleolus is purple, evolving DTI versus bruising Area above the left lateral malleolus is purple and looks like bruising Left mid calf along the lateral aspect is purple in color and looks like bruising Left anterior leg just below the knee is purple and looks like bruising  When I asked the patient about recommendations made by the surgeon for bilateral AKAs he stated he didn't want to have surgery if he didn't need to.  I explained that nothing I do will make these areas heal, and that the surgeons would not recommend surgery unless they believed it was necessary.  I explained possible consequences of not following their recommendations that could include overwhelming infection, and continued worsening of the lower legs.  After our discussion he agreed to have vascular surgery re-evaluate his condition and he would further consider their recommendations.  For now, please perform the following wound care: Please apply betadine to all dry eschar and purple areas on bilateral feet.   Apply a saline moistened gauze to the open right lateral ankle wound. Cover this wound with gauze, and wrap feet in kerlex.  If surgery assumes care and performs surgery, follow their instructions on wound care.  Please obtain a vascular surgery consult.  Discussed POC with patient and bedside nurse.  Re consult if needed, will not follow at this time. Thank you,  Val Riles MSN,RN,CWOCN,CNS-BC, [phone])

## 2017-11-19 LAB — GLUCOSE, CAPILLARY
GLUCOSE-CAPILLARY: 140 mg/dL — AB (ref 65–99)
Glucose-Capillary: 197 mg/dL — ABNORMAL HIGH (ref 65–99)
Glucose-Capillary: 99 mg/dL (ref 65–99)
Glucose-Capillary: 162 mg/dL — ABNORMAL HIGH (ref 65–99)

## 2017-11-19 LAB — BPAM RBC
Blood Product Expiration Date: 201904022359
ISSUE DATE / TIME: 201903050327
Unit Type and Rh: 7300

## 2017-11-19 LAB — TYPE AND SCREEN
ABO/RH(D): B POS
ANTIBODY SCREEN: NEGATIVE
Unit division: 0

## 2017-11-19 LAB — PROTIME-INR
INR: 3.2
PROTHROMBIN TIME: 32.5 s — AB (ref 11.4–15.2)

## 2017-11-19 MED ORDER — WARFARIN - PHARMACIST DOSING INPATIENT
Freq: Every day | Status: DC
  Administered 2017-11-19 – 2017-11-21 (×3)

## 2017-11-19 MED ORDER — WARFARIN SODIUM 1 MG PO TABS
1.0000 mg | ORAL_TABLET | Freq: Once | ORAL | Status: AC
  Administered 2017-11-19: 1 mg via ORAL
  Filled 2017-11-19: qty 1

## 2017-11-19 NOTE — Progress Notes (Signed)
FVC 2.2L NIF > -40 Good pt effort and reproducible results.

## 2017-11-19 NOTE — Progress Notes (Addendum)
Subjective: Feels much better this morning and says somewhat better than his baseline.  Exam: Vitals:   11/19/17 0550 11/19/17 0947  BP: (!) 159/61 (!) 135/51  Pulse: 80 86  Resp: 16 16  Temp: 98.5 F (36.9 C) 98.6 F (37 C)  SpO2: 98% 99%   Gen: Chronically ill-appearing, In bed, NAD. Resp: non-labored breathing, no acute distress Abd: soft, nt Extremities: muscle wasting lower extremities  Neuro: MS: alert to person and place but not time CN: PERRL, EOMI  Motor: strength improved in all extremities including neck flexion. 5/5 UEs at biceps, triceps, and deltoids. Hip flexion 5/5 bilaterally. Knee extension 4/5 bilaterally. ADF and APF 5/5 bilaterally. Subtle asterixis present.   Pertinent Labs: BMET    Component Value Date/Time   NA 131 (L) 11/18/2017 0123   NA 140 02/28/2017 0916   NA 138 10/17/2016 1303   K 4.4 11/18/2017 0123   K 5.1 10/17/2016 1303   CL 92 (L) 11/18/2017 0123   CO2 23 11/18/2017 0123   CO2 18 (L) 10/17/2016 1303   GLUCOSE 183 (H) 11/18/2017 0123   GLUCOSE 197 (H) 10/17/2016 1303   BUN 90 (H) 11/18/2017 0123   BUN 46 (H) 02/28/2017 0916   BUN 71.0 (H) 10/17/2016 1303   CREATININE 8.76 (H) 11/18/2017 0123   CREATININE 5.9 (HH) 10/17/2016 1303   CALCIUM 9.0 11/18/2017 0123   CALCIUM 8.9 10/17/2016 1303   GFRNONAA 5 (L) 11/18/2017 0123   GFRAA 6 (L) 11/18/2017 0123   CBC    Component Value Date/Time   WBC 11.1 (H) 11/18/2017 0123   RBC 2.62 (L) 11/18/2017 0123   HGB 7.9 (L) 11/18/2017 0123   HGB 12.0 (L) 10/17/2016 1303   HCT 26.0 (L) 11/18/2017 0123   HCT 34.4 (L) 10/17/2016 1303   PLT 326 11/18/2017 0123   PLT 198 10/17/2016 1303   MCV 99.2 11/18/2017 0123   MCV 86.6 10/17/2016 1303   MCH 30.2 11/18/2017 0123   MCHC 30.4 11/18/2017 0123   RDW 17.2 (H) 11/18/2017 0123   RDW 13.3 10/17/2016 1303   LYMPHSABS 0.6 (L) 11/17/2017 1345   LYMPHSABS 1.0 10/17/2016 1303   MONOABS 0.5 11/17/2017 1345   MONOABS 0.5 10/17/2016 1303   EOSABS 0.0 11/17/2017 1345   EOSABS 0.2 10/17/2016 1303   BASOSABS 0.0 11/17/2017 1345   BASOSABS 0.0 10/17/2016 1303   Hgb A1c 8.1  Impression:  82 y.o. 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 Started on IVIG 11/17/17 with significant improvement in weakness on hospital day #3.  Recommendations: 1) Continue IVIG 400 mg/kg q24h x 5 days (day 3 of 5) 2) Continue Prednisone 20 mg daily 4) HD per Nephrology  5) Antibiotics narrowed to Cefazolin per primary 5) PT   [name], MD Internal Medicine PGY-3

## 2017-11-19 NOTE — Progress Notes (Signed)
ANTICOAGULATION CONSULT NOTE - Initial Consult  Pharmacy Consult for Coumadin Indication: atrial fibrillation  Allergies  Allergen Reactions  . Amlodipine Besylate Swelling    Angioedema   . Lisinopril Other (See Comments)    Renal insufficiency  . Statins Other (See Comments)    Muscle aches  . Actos [Pioglitazone]     Chest pain  . Ropinirole Other (See Comments)    Causes excessive grogginess the following morning after taking  . Tape Other (See Comments)    SKIN IS THIN AND TAPE WILL ACTUALLY TEAR IT; PLEASE USE COBAN WRAP INSTEAD!!    Patient Measurements: Height: 6' (182.9 cm) Weight: 162 lb 11.2 oz (73.8 kg) IBW/kg (Calculated) : 77.6  Assessment: 82 yo M on Coumadin 1mg  daily PTA for Afib per family. INR on admit elevated at 5.99. Received vitamin K PO. INR today is down to 3.2. Hgb 7.9, plts wnl.  Goal of Therapy:  INR 2-3 Monitor platelets by anticoagulation protocol: Yes   Plan:  Give Coumadin 1mg  PO x 1 tonight Monitor daily INR, CBC, s/s of bleed  James David J 11/19/2017,8:26 AM

## 2017-11-19 NOTE — Discharge Instructions (Signed)

## 2017-11-19 NOTE — Progress Notes (Signed)
[facility]                                                                              Patient Demographics  James David, is a 82 y.o. male, DOB - Aug 02, 1936, ERD:[id number]  Admit date - 11/17/2017   Admitting Physician Najat Olazabal Krystal Eaton, MD  Outpatient Primary MD for the patient is [name], MD  Outpatient specialists:   LOS - 2  days   Medical records reviewed and are as summarized below:    Chief Complaint  Patient presents with  . Weakness       Brief summary   Patient is a 82 year old male with ESRD on HD, MWF, atrial fibrillation on Coumadin, history of myasthenia gravis on prednisone, CAD, history of CVA, second-degree heart block with pacemaker, diabetes mellitus presented to ED with worsening weakness. Patient reported that he has noticed worsening weakness in the last 3 weeks. He was just discharged home on 2/27, was treated for progressive weakness, cellulitis of his right foot on doxycycline and IV Fortaz with hemodialysis for 6 weeks. Patient was seen by vascular surgery who recommended bilateral AKA for bilateral heel ulcers, left great toe, right anterior foot and outer ankle wounds, nonhealing however patient refused. He was seen by psychiatry and patient was deemed as unable to have capacity to make decisions. Patient's spouse understood overall poor prognosis however did not wish to pursue medication either.  At baseline, patient reports that he is unable to ambulate and uses wheelchair. On the day of admission, was found to have increased generalized weakness by his [facility] RN and he was unable to transfer to his wheelchair. He was seen by neurology in ED and recommended IVIG for acute myasthenia gravis exacerbation.  Assessment & Plan   Myasthenia gravis in crisis [facility]) - Patient has been evaluated by neurology and feel that his symptoms are likely related to myasthenia gravis crisis -  neurology recommended IVIG for 5 days, continue  prednisone 20 mg daily, no need for stress dose steroids .  -  IVIG day # 3 out of 5 today - Feeling somewhat better today  Active Problems:   ESRD (end stage renal disease) on dialysis ([place]) - Normal dialysis schedule MW F, nephrology following    Diabetes mellitus due to underlying condition with diabetic nephropathy ([place]), uncontrolled -  hemoglobin A1c 8.1  -Continue sliding scale insulin while inpatient, hold Amaryl - Add NovoLog meal coverage 3 units 3 times a day before meals    Chronic diastolic heart failure ([place]) - Currently compensated, volume management with HD   - 2-D echo 03/2017 showed EF of 50-55%  Pressure injury of the skin with bilateral heel ulcers, ankle wound, left great toe wound - Patient was seen by vascular surgery during the previous admission who recommended bilateral AKA for bilateral heel ulcers, left great toe, right anterior foot and outer ankle wounds, nonhealing however patient refused. He was seen by psychiatry and patient was deemed as unable to have capacity to make decisions. Patient's spouse understood overall poor prognosis however did not wish to pursue medication either. - Discussed with neurology, recommended no vancomycin  or doxycycline as it may exacerbate myasthenia gravis -  continue IV Ancef, wound care consulted, transition to oral Keflex at the time of DC.  - Blood cultures negative to date  Acute on chronic anemia - Anemia of chronic disease due to his ESRD however patient had bleeding from his elbow yesterday, supratherapeutic INR, hemoglobin 7.9. - Transfuse 1 unit packed RBC during dialysis.    Atrial fibrillation, permanent (HCC) - Currently rate controlled, continue Coreg  - INR supratherapeutic 5.9 at the time of admission with bleeding from the right elbow abrasion, received vitamin K 2.5 mg PO X1 - INR is still supratherapeutic 3.2, Coumadin per pharmacy    Dyslipidemia - Continue Zetia, fenofibrate     Dysphagia -   continue renal diet, SLP evaluation    Generalized weakness -  PTOT evaluation   Code Status: Full CODE STATUS DVT Prophylaxis:  Warfarin on hold Family Communication: Discussed in detail with the patient, all imaging results, lab results explained to the patient    Disposition Plan: Needs IVIG for total 5 days  Time Spent in minutes   25 minutes  Procedures:  Hemodialysis  Consultants:   Neurology Nephrology  Antimicrobials:   IV Ancef 3/4   Medications  Scheduled Meds: . benzonatate  100 mg Oral TID  . calcium acetate  1,334 mg Oral TID WC  . collagenase   Topical Daily  . ezetimibe  10 mg Oral Daily  . feeding supplement (ENSURE ENLIVE)  237 mL Oral BID BM  . fenofibrate  160 mg Oral Daily  . finasteride  5 mg Oral Daily  . Immune Globulin 10%  400 mg/kg Intravenous Q24 Hr x 5  . insulin aspart  0-5 Units Subcutaneous QHS  . insulin aspart  0-9 Units Subcutaneous TID WC  . insulin aspart  3 Units Subcutaneous TID WC  . lanthanum  1,000 mg Oral TID WC  . multivitamin  1 tablet Oral QHS  . predniSONE  20 mg Oral Q breakfast  . warfarin  1 mg Oral Once  . Warfarin - Pharmacist Dosing Inpatient   Does not apply q1800   Continuous Infusions: .  ceFAZolin (ANCEF) IV Stopped (11/18/17 1802)   PRN Meds:.acetaminophen **OR** acetaminophen, HYDROcodone-acetaminophen, ondansetron **OR** ondansetron (ZOFRAN) IV, traMADol   Antibiotics   Anti-infectives (From admission, onward)   Start     Dose/Rate Route Frequency Ordered Stop   11/17/17 1800  ceFAZolin (ANCEF) IVPB 1 g/50 mL premix     1 g 100 mL/hr over 30 Minutes Intravenous Every 24 hours 11/17/17 1751          Subjective:   James David was seen and examined today. Feeling somewhat better today. No fevers or chills. No pain. Patient denies dizziness, chest pain, shortness of breath, abdominal pain, N/V/D/C. No acute events overnight  Objective:   Vitals:   11/19/17 1302 11/19/17 1316 11/19/17 1321  11/19/17 1330  BP: 136/63 (!) 145/62  (!) 145/70  Pulse: 78 79 85 81  Resp: 16 17 11 19   Temp: 98.2 F (36.8 C)     TempSrc: Oral     SpO2: 99%     Weight: 74 kg (163 lb 2.3 oz)     Height:        Intake/Output Summary (Last 24 hours) at 11/19/2017 1436 Last data filed at 11/19/2017 0900 Gross per 24 hour  Intake 560 ml  Output 100 ml  Net 460 ml     Wt Readings from Last 3 Encounters:  11/19/17 74 kg (163 lb 2.3 oz)  11/12/17 73.8 kg (162 lb 11.2 oz)  10/22/17 77.1 kg (170 lb)     Exam   General: Alert and oriented x 3, NAD  Eyes:   HEENT:    Cardiovascular: S1 S2 clear, RRR  No pedal edema b/l  Respiratory: Clear to auscultation bilaterally, no wheezing, rales or rhonchi  Gastrointestinal: Soft, nontender, nondistended, + bowel sounds  Ext: no pedal edema bilaterally  Neuro: lower extremity weakness improving  Musculoskeletal: No digital cyanosis, clubbing  Skin: No rashes  Psych: Normal affect and demeanor, alert and oriented x3    Data Reviewed:  I have personally reviewed following labs and imaging studies  Micro Results Recent Results (from the past 240 hour(s))  Blood Culture (routine x 2)     Status: None (Preliminary result)   Collection Time: 11/17/17  1:30 PM  Result Value Ref Range Status   Specimen Description BLOOD LEFT ANTECUBITAL  Final   Special Requests IN PEDIATRIC BOTTLE Blood Culture adequate volume  Final   Culture   Final    NO GROWTH < 24 HOURS Performed at [facility] [address] [place], [place] [address]    Report Status PENDING  Incomplete  Blood Culture (routine x 2)     Status: None (Preliminary result)   Collection Time: 11/17/17  1:45 PM  Result Value Ref Range Status   Specimen Description BLOOD LEFT HAND  Final   Special Requests IN PEDIATRIC BOTTLE Blood Culture adequate volume  Final   Culture   Final    NO GROWTH < 24 HOURS Performed at [facility], [place] [address] [place], [place]  [address]    Report Status PENDING  Incomplete    Radiology Reports Dg Chest 2 View  Result Date: 11/04/2017 CLINICAL DATA:  Fatigue, weakness, history of myasthenia gravis EXAM: CHEST  2 VIEW COMPARISON:  07/27/2017 FINDINGS: Surgical clips over the left chest. Left-sided pacing device, similar compared to previous exam. No significant pleural effusion. No acute consolidation. Stable enlarged cardiomediastinal silhouette with aortic atherosclerosis. No pneumothorax. Vascular stent in the right axillary region. Flowing osteophytes of the spine. IMPRESSION: No active cardiopulmonary disease.  Cardiomegaly. Electronically Signed   By: Donavan Foil M.D.   On: 11/04/2017 18:13   Dg Ankle 2 Views Left  Result Date: 11/04/2017 CLINICAL DATA:  Ankle pain EXAM: LEFT ANKLE - 2 VIEW COMPARISON:  None. FINDINGS: Only a single lateral view of the ankle is submitted. According to the notes, patient unable to hold position for AP view. Large plantar calcaneal spur and enthesophytes. Vascular calcification. No obvious acute abnormality IMPRESSION: 1. Limited single lateral view shows no acute abnormality 2. Large plantar calcaneal spur. Electronically Signed   By: Donavan Foil M.D.   On: 11/04/2017 18:42   Dg Ankle Complete Right  Result Date: 11/04/2017 CLINICAL DATA:  Worsening foot wounds EXAM: RIGHT ANKLE - COMPLETE 3+ VIEW COMPARISON:  06/28/2016 FINDINGS: No acute displaced fracture or malalignment is seen. Possible small erosion at the fibular malleolar tip on one view only. There is lucency and thinning of the soft tissues over the fibular malleolus suspected to represent an ulcer. Mild degenerative changes of the medial ankle joint. Vascular calcifications. Large calcaneal spur and enthesophyte. Soft tissue thickening posterior calcaneal region. No soft tissue gas IMPRESSION: 1. Questionable small cortical erosion/focus of bone infection at the lateral fibular malleolar region on one view; there is thinning  of the soft tissues and  a lucency overlying the fibular malleolus, presumably representing an ulcer or wound. 2. Large plantar calcaneal spurs. Electronically Signed   By: Donavan Foil M.D.   On: 11/04/2017 18:19   Ct Head Wo Contrast  Result Date: 11/17/2017 CLINICAL DATA:  82 year old male on dialysis presenting with generalized weakness starting yesterday. Missed dialysis today. Initial encounter. EXAM: CT HEAD WITHOUT CONTRAST TECHNIQUE: Contiguous axial images were obtained from the base of the skull through the vertex without intravenous contrast. COMPARISON:  03/19/2017 CT. FINDINGS: Brain: No intracranial hemorrhage or CT evidence of large acute infarct. Remote small infarct right lenticular nucleus/caudate. Chronic microvascular changes. Global atrophy. No intracranial mass lesion noted on this unenhanced exam. Vascular: Prominent vascular calcifications. Skull: Negative Sinuses/Orbits: No acute orbital abnormality. Minimal polypoid opacification anterior right sphenoid sinus. Remainder of visualized paranasal sinuses are clear. Other: Mastoid air cells and middle ear cavities are clear. IMPRESSION: No acute intracranial abnormality noted. Remote small infarct right lenticular nucleus/caudate. Electronically Signed   By: Genia Del M.D.   On: 11/17/2017 18:04   Dg Chest Port 1 View  Result Date: 11/17/2017 CLINICAL DATA:  Cough and weakness for 1 day. History of CHF, end-stage renal disease on dialysis. EXAM: PORTABLE CHEST 1 VIEW COMPARISON:  Chest radiograph November 04, 2017 FINDINGS: Cardiac silhouette is mildly enlarged and unchanged. Mediastinal silhouette is non suspicious. Calcified aortic knob. Small LEFT pleural effusion, RIGHT costophrenic angle out of field-of-view. Pulmonary vasculature appears normal. No focal consolidation. No pneumothorax. RIGHT subclavian vascular stent. Dual lead LEFT cardiac pacemaker in situ. ACDF. Surgical clips LEFT axilla. Advanced RIGHT acromioclavicular  osteoarthrosis. IMPRESSION: Small LEFT pleural effusion.  Stable cardiomegaly. Aortic Atherosclerosis (ICD10-I70.0). Electronically Signed   By: Elon Alas M.D.   On: 11/17/2017 15:03   Dg Foot Complete Left  Result Date: 11/04/2017 CLINICAL DATA:  Bilateral foot wounds right and left heel EXAM: LEFT FOOT - COMPLETE 3+ VIEW COMPARISON:  None. FINDINGS: No acute fracture or malalignment. Moderate arthritis at the first MTP joint. Screw fixation of the head of fifth metatarsal with chronic deformity noted. Vascular calcifications. Large calcaneal spurs and enthesophyte. No soft tissue gas. No radiopaque foreign body IMPRESSION: 1. No acute osseous abnormality 2. Postsurgical changes of the distal fifth metatarsal with evidence of old fracture deformity 3. Large calcaneal spurs Electronically Signed   By: Donavan Foil M.D.   On: 11/04/2017 18:15   Dg Foot Complete Right  Result Date: 11/04/2017 CLINICAL DATA:  Foot wounds EXAM: RIGHT FOOT COMPLETE - 3+ VIEW COMPARISON:  None. FINDINGS: No acute displaced fracture or malalignment is seen. Extensive vascular calcifications. Possible old fracture deformity involving the proximal shaft and base of the third metatarsal. Large plantar calcaneal spur. No soft tissue gas. IMPRESSION: 1. No acute osseous abnormality 2. Large plantar calcaneal spur Electronically Signed   By: Donavan Foil M.D.   On: 11/04/2017 18:23    Lab Data:  CBC: Recent Labs  Lab 11/17/17 1345 11/17/17 1353 11/18/17 0123  WBC 14.1*  --  11.1*  NEUTROABS 13.0*  --   --   HGB 8.2* 10.5* 7.9*  HCT 27.9* 31.0* 26.0*  MCV 100.0  --  99.2  PLT 335  --  229   Basic Metabolic Panel: Recent Labs  Lab 11/17/17 1345 11/17/17 1353 11/18/17 0123  NA 132* 131* 131*  K 4.5 4.4 4.4  CL 91* 91* 92*  CO2 24  --  23  GLUCOSE 276* 261* 183*  BUN 83* 83* 90*  CREATININE 8.36* 8.50*  8.76*  CALCIUM 9.3  --  9.0  PHOS 2.9  --   --    GFR: Estimated Creatinine Clearance: 7.2  mL/min (A) (by C-G formula based on SCr of 8.76 mg/dL (H)). Liver Function Tests: Recent Labs  Lab 11/17/17 1345  AST 22  ALT 30  ALKPHOS 50  BILITOT 0.8  PROT 5.9*  ALBUMIN 2.2*   No results for input(s): LIPASE, AMYLASE in the last 168 hours. No results for input(s): AMMONIA in the last 168 hours. Coagulation Profile: Recent Labs  Lab 11/17/17 1530 11/18/17 1542 11/19/17 0534  INR 5.99* 4.41* 3.20   Cardiac Enzymes: No results for input(s): CKTOTAL, CKMB, CKMBINDEX, TROPONINI in the last 168 hours. BNP (last 3 results) No results for input(s): PROBNP in the last 8760 hours. HbA1C: Recent Labs    11/18/17 0121  HGBA1C 8.1*   CBG: Recent Labs  Lab 11/18/17 1507 11/18/17 1652 11/18/17 2127 11/19/17 0729 11/19/17 1209  GLUCAP 86 142* 275* 197* 99   Lipid Profile: No results for input(s): CHOL, HDL, LDLCALC, TRIG, CHOLHDL, LDLDIRECT in the last 72 hours. Thyroid Function Tests: No results for input(s): TSH, T4TOTAL, FREET4, T3FREE, THYROIDAB in the last 72 hours. Anemia Panel: No results for input(s): VITAMINB12, FOLATE, FERRITIN, TIBC, IRON, RETICCTPCT in the last 72 hours. Urine analysis:    Component Value Date/Time   COLORURINE YELLOW 11/17/2017 1726   APPEARANCEUR HAZY (A) 11/17/2017 1726   LABSPEC 1.015 11/17/2017 1726   PHURINE 7.0 11/17/2017 1726   GLUCOSEU 150 (A) 11/17/2017 1726   HGBUR SMALL (A) 11/17/2017 1726   BILIRUBINUR NEGATIVE 11/17/2017 1726   KETONESUR NEGATIVE 11/17/2017 1726   PROTEINUR 100 (A) 11/17/2017 1726   NITRITE NEGATIVE 11/17/2017 1726   LEUKOCYTESUR MODERATE (A) 11/17/2017 1726     Ketan Renz M.D. [facility] 11/19/2017, 2:36 PM  Pager: [phone] Between 7am to 7pm - call Pager - [phone]  After 7pm go to [email or web address] - password [email or web address]  Call night coverage person covering after 7pm

## 2017-11-19 NOTE — Progress Notes (Addendum)
Subjective:  Wife in room No cos ,appears normal ms this am ,"Wednesday my HD day " for hd today normal schedule   Objective Vital signs in last 24 hours: Vitals:   11/18/17 2032 11/19/17 0550 11/19/17 0742 11/19/17 0947  BP: (!) 137/48 (!) 159/61  (!) 135/51  Pulse: 100 80  86  Resp: 16 16  16   Temp: 98.5 F (36.9 C) 98.5 F (36.9 C)  98.6 F (37 C)  TempSrc: Oral Oral  Oral  SpO2: 100% 98%  99%  Weight: 73.8 kg (162 lb 11.2 oz)     Height:   6' (1.829 m)    Weight change:   Physical Exam: General: alert chronically ill elderly WM , NAD  Heart:RRR, No m, r, g Lungs: CTA bilat  Abdomen: BS pos ,soft, NT, ND  Extremities: No pedal edema with dry gangr. Skin changes bilat feet, R Foot bandage dry /clean Dialysis Access: RUA AVF pos bruit   OP Dialysis:[facility] MWF 4h 2/2.25 P2 Hep none RUA AVF  Mircera 200 q2wk ( last given 11/14/17)   Problem/Plan: 1. Myasthenia gravis - progressive debility and gen'd weakness.  Per neuro dosing  course of IVIG.  / with prednisone  2  Bilat foot ulcers/ R lat malleolus osteo - vascular surg rec'd bilat amputation last admission but patient declined / wife aware / Palliative saw recent admit / pharmacy Cefazolin for Osteo on  HD  6 weeks total last dose was to be 12/17/17 on MWF hd / noted  Now Fortaz 1 gm q day  3 ESRD HD MWF hd to get back on schedule  4  Afib - on coumadin, BB 5 Vol / HTN - bp sable/ no vol excess on exam 6 Anemia ckd - hgb 7.9  cont esa  Last given 11/14/17, 7. MBD ckd - check phos, hold binders for now  Ernest Haber, PA-C [place] [phone] 11/19/2017,12:03 PM  LOS: 2 days   Pt seen, examined and agree w A/P as above.  Kelly Splinter MD [facility] pager [phone]   11/19/2017, 2:46 PM    Labs: Basic Metabolic Panel: Recent Labs  Lab 11/17/17 1345 11/17/17 1353 11/18/17 0123  NA 132* 131* 131*  K 4.5 4.4 4.4  CL 91* 91* 92*  CO2 24  --  23  GLUCOSE  276* 261* 183*  BUN 83* 83* 90*  CREATININE 8.36* 8.50* 8.76*  CALCIUM 9.3  --  9.0  PHOS 2.9  --   --    Liver Function Tests: Recent Labs  Lab 11/17/17 1345  AST 22  ALT 30  ALKPHOS 50  BILITOT 0.8  PROT 5.9*  ALBUMIN 2.2*   No results for input(s): LIPASE, AMYLASE in the last 168 hours. No results for input(s): AMMONIA in the last 168 hours. CBC: Recent Labs  Lab 11/17/17 1345 11/17/17 1353 11/18/17 0123  WBC 14.1*  --  11.1*  NEUTROABS 13.0*  --   --   HGB 8.2* 10.5* 7.9*  HCT 27.9* 31.0* 26.0*  MCV 100.0  --  99.2  PLT 335  --  326   Cardiac Enzymes: No results for input(s): CKTOTAL, CKMB, CKMBINDEX, TROPONINI in the last 168 hours. CBG: Recent Labs  Lab 11/18/17 0807 11/18/17 1507 11/18/17 1652 11/18/17 2127 11/19/17 0729  GLUCAP 92 86 142* 275* 197*    Studies/Results: Ct Head Wo Contrast  Result Date: 11/17/2017 CLINICAL DATA:  82 year old male on dialysis presenting with generalized weakness starting yesterday.  Missed dialysis today. Initial encounter. EXAM: CT HEAD WITHOUT CONTRAST TECHNIQUE: Contiguous axial images were obtained from the base of the skull through the vertex without intravenous contrast. COMPARISON:  03/19/2017 CT. FINDINGS: Brain: No intracranial hemorrhage or CT evidence of large acute infarct. Remote small infarct right lenticular nucleus/caudate. Chronic microvascular changes. Global atrophy. No intracranial mass lesion noted on this unenhanced exam. Vascular: Prominent vascular calcifications. Skull: Negative Sinuses/Orbits: No acute orbital abnormality. Minimal polypoid opacification anterior right sphenoid sinus. Remainder of visualized paranasal sinuses are clear. Other: Mastoid air cells and middle ear cavities are clear. IMPRESSION: No acute intracranial abnormality noted. Remote small infarct right lenticular nucleus/caudate. Electronically Signed   By: Genia Del M.D.   On: 11/17/2017 18:04   Dg Chest Port 1 View  Result  Date: 11/17/2017 CLINICAL DATA:  Cough and weakness for 1 day. History of CHF, end-stage renal disease on dialysis. EXAM: PORTABLE CHEST 1 VIEW COMPARISON:  Chest radiograph November 04, 2017 FINDINGS: Cardiac silhouette is mildly enlarged and unchanged. Mediastinal silhouette is non suspicious. Calcified aortic knob. Small LEFT pleural effusion, RIGHT costophrenic angle out of field-of-view. Pulmonary vasculature appears normal. No focal consolidation. No pneumothorax. RIGHT subclavian vascular stent. Dual lead LEFT cardiac pacemaker in situ. ACDF. Surgical clips LEFT axilla. Advanced RIGHT acromioclavicular osteoarthrosis. IMPRESSION: Small LEFT pleural effusion.  Stable cardiomegaly. Aortic Atherosclerosis (ICD10-I70.0). Electronically Signed   By: Elon Alas M.D.   On: 11/17/2017 15:03   Medications: .  ceFAZolin (ANCEF) IV Stopped (11/18/17 1802)   . benzonatate  100 mg Oral TID  . calcium acetate  1,334 mg Oral TID WC  . collagenase   Topical Daily  . ezetimibe  10 mg Oral Daily  . feeding supplement (ENSURE ENLIVE)  237 mL Oral BID BM  . fenofibrate  160 mg Oral Daily  . finasteride  5 mg Oral Daily  . Immune Globulin 10%  400 mg/kg Intravenous Q24 Hr x 5  . insulin aspart  0-5 Units Subcutaneous QHS  . insulin aspart  0-9 Units Subcutaneous TID WC  . insulin aspart  3 Units Subcutaneous TID WC  . lanthanum  1,000 mg Oral TID WC  . multivitamin  1 tablet Oral QHS  . predniSONE  20 mg Oral Q breakfast  . warfarin  1 mg Oral Once  . Warfarin - Pharmacist Dosing Inpatient   Does not apply [phone]

## 2017-11-19 NOTE — Progress Notes (Signed)
  Speech Language Pathology Treatment: Dysphagia  Patient Details Name: NITISH ROES MRN: [id number] DOB: 04/18/1936 Today's Date: 11/19/2017 Time: 0805-0820 SLP Time Calculation (min) (ACUTE ONLY): 15 min  Assessment / Plan / Recommendation Clinical Impression  Pt was seen for skilled ST targeting dysphagia goals.  RN was present upon therapist's arrival administering morning medications.  Pt consumed three pills, two of which were very large capsules, all at once with straw sips of water and demonstrated no overt s/s of aspiration or complaints of globus sensation.  Pt also consumed his entire breakfast of hard boiled eggs, crispy bacon, and coffee with no overt s/s of aspiration and no difficulty masticating or clearing solids from the oral cavity.  Pt utilized universal swallowing precautions with mod I but did need cues/education to remain upright upon completion of meal for prevention of postprandial aspiration.  Pt left in bed with bed alarm set and call bell within reach.  Would recommend follow up for x1 additional session to ensure toleration of diet.     HPI HPI: Patient is a 82 year old male with ESRD on HD, MWF, atrial fibrillation on Coumadin, history of myasthenia gravis on prednisone, CAD, history of CVA, second-degree heart block with pacemaker, diabetes mellitus presented to ED with worsening weakness. Patient reported that he has noticed worsening weakness in the last 3 weeks. At baseline, patient reports that he is unable to ambulate and uses wheelchair. Today he was found to have increased generalized weakness by his home health nurse and he was unable to transfer to his wheelchair. Patient also reported that he had difficulty keeping his head up due to weakness and did not go for his regular HD today. Patient reported a week of cough and congestion but no fevers chills, no significant sputum production.      SLP Plan  Continue with current plan of care       Recommendations   Diet recommendations: Regular;Thin liquid Liquids provided via: Cup;Straw Medication Administration: Whole meds with liquid Supervision: Patient able to self feed;Intermittent supervision to cue for compensatory strategies Compensations: Slow rate;Small sips/bites;Follow solids with liquid Postural Changes and/or Swallow Maneuvers: Upright 30-60 min after meal;Seated upright 90 degrees                Oral Care Recommendations: Oral care BID Follow up Recommendations: None SLP Visit Diagnosis: Dysphagia, unspecified (R13.10) Plan: Continue with current plan of care       GO                Tjay Velazquez, Selinda Orion 11/19/2017, 8:28 AM

## 2017-11-20 LAB — BASIC METABOLIC PANEL
Anion gap: 12 (ref 5–15)
BUN: 20 mg/dL (ref 6–20)
CHLORIDE: 93 mmol/L — AB (ref 101–111)
CO2: 28 mmol/L (ref 22–32)
Calcium: 9 mg/dL (ref 8.9–10.3)
Creatinine, Ser: 3.25 mg/dL — ABNORMAL HIGH (ref 0.61–1.24)
GFR calc non Af Amer: 17 mL/min — ABNORMAL LOW (ref >60)
GFR, EST AFRICAN AMERICAN: 19 mL/min — AB (ref >60)
Glucose, Bld: 101 mg/dL — ABNORMAL HIGH (ref 65–99)
POTASSIUM: 3 mmol/L — AB (ref 3.5–5.1)
Sodium: 133 mmol/L — ABNORMAL LOW (ref 135–145)

## 2017-11-20 LAB — CBC
HCT: 32.4 % — ABNORMAL LOW (ref 39.0–52.0)
HEMOGLOBIN: 10.2 g/dL — AB (ref 13.0–17.0)
MCH: 30.2 pg (ref 26.0–34.0)
MCHC: 31.5 g/dL (ref 30.0–36.0)
MCV: 95.9 fL (ref 78.0–100.0)
Platelets: 260 10*3/uL (ref 150–400)
RBC: 3.38 MIL/uL — AB (ref 4.22–5.81)
RDW: 18.6 % — ABNORMAL HIGH (ref 11.5–15.5)
WBC: 11.1 10*3/uL — ABNORMAL HIGH (ref 4.0–10.5)

## 2017-11-20 LAB — GLUCOSE, CAPILLARY
GLUCOSE-CAPILLARY: 164 mg/dL — AB (ref 65–99)
GLUCOSE-CAPILLARY: 162 mg/dL — AB (ref 65–99)
Glucose-Capillary: 104 mg/dL — ABNORMAL HIGH (ref 65–99)
Glucose-Capillary: 336 mg/dL — ABNORMAL HIGH (ref 65–99)

## 2017-11-20 LAB — PROTIME-INR
INR: 2.15
PROTHROMBIN TIME: 23.8 s — AB (ref 11.4–15.2)

## 2017-11-20 MED ORDER — WARFARIN SODIUM 1 MG PO TABS
1.0000 mg | ORAL_TABLET | Freq: Once | ORAL | Status: AC
  Administered 2017-11-20: 1 mg via ORAL
  Filled 2017-11-20: qty 1

## 2017-11-20 NOTE — Care Management Note (Signed)
Case Management Note  Patient Details  Name: James David MRN: [id number] Date of Birth: April 05, 1936  Subjective/Objective:    Hx of myasthenia gravis on prednisone, end-stage renal disease on hemodialysis, atrial fibrillation, chronic lower extremity wounds bilaterally. From home with wife; Admitted with Myasthenia Gravis Crisis.            At baseline, patient reports that he is unable to ambulate and uses wheelchair  Action/Plan: IVIG for 5 days, continue prednisone 20 mg daily.  NCM will continue to monitor for discharge needs.Expected Discharge Date:   11/22/2017                Expected Discharge Plan:  [place]  In-House Referral:  (Speech Language Pathology, Wound consult)  Discharge planning Services  CM Consult  [facility] Arranged:   Active PTA: RN, PT, Social Worker [place] Agency:   [facility]  Status of Service:  In process, will continue to follow  Kristen Cardinal, RN  Nurse case [place] 11/20/2017, 3:53 PM

## 2017-11-20 NOTE — Progress Notes (Signed)
NIF -26 x 2 VC 2.2L x 2   Good effort noted.

## 2017-11-20 NOTE — Progress Notes (Signed)
PROGRESS NOTE    James David  VOZ:[id number] DOB: 1936/02/16 DOA: 11/17/2017 PCP: [name], MD   Brief Narrative:   Patient is a 82 year old male with ESRD on HD, MWF, atrial fibrillation on Coumadin, history of myasthenia gravis on prednisone, CAD, history of CVA, second-degree heart block with pacemaker, diabetes mellitus presented to ED with worsening weakness. Patient reported that he has noticed worsening weakness in the last 3 weeks. He was just discharged home on 2/27, was treated for progressive weakness, cellulitis of his right foot on doxycyclineand IV Fortaz with hemodialysis for 6 weeks. Patient was seen by vascular surgery who recommended bilateral AKA for bilateral heel ulcers, left great toe, right anterior foot and outer ankle wounds, nonhealing however patient refused. He was seen by psychiatry and patient was deemed as unable to have capacity to make decisions. Patient's spouse understood overall poor prognosis however did not wish to pursue medication either.  At baseline, patient reports that he is unable to ambulate and uses wheelchair. On the day of admission, was found to have increased generalized weakness by his [facility] RN and he was unable to transfer to his wheelchair. He was seen by neurology in ED and recommended IVIG for acute myasthenia gravis exacerbation.   Assessment & Plan:   Principal Problem:   Myasthenia gravis in crisis [facility]) Active Problems:   Diabetes mellitus due to underlying condition with diabetic nephropathy ([place])   Chronic diastolic heart failure (HCC)   ESRD (end stage renal disease) on dialysis (HCC)   Atrial fibrillation, permanent ([place])   Dyslipidemia   Dysphagia   Pressure injury of skin   Weakness   Generalized weakness   Myasthenia exacerbation (HCC)   Myasthenia gravis in crisis [facility]) -Patient has been evaluated by neurology and feel that his symptoms are likely related to myasthenia gravis crisis - neurology  recommended IVIG for 5 days, continue prednisone 20 mg daily, no need for stress dose steroids .  - IVIG day # 4 out of 5 today - Feeling stronger today  Active Problems: ESRD (end stage renal disease) on dialysis ([place]) -Normal dialysis schedule MWF, nephrology following  Diabetes mellitus due to underlying condition with diabetic nephropathy ([place]), uncontrolled - hemoglobin A1c 8.1  -Continue sliding scale insulin while inpatient, hold Amaryl - Add NovoLog meal coverage 3 units 3 times a day before meals  Chronic diastolic heart failure (HCC) -Currently compensated, volume management with HD -2-D echo 03/2017 showed EF of 50-55%  Pressure injury of the skin with bilateral heel ulcers, ankle wound, left great toe wound -Patient was seen by vascular surgery during the previous admission who recommended bilateral AKA for bilateral heel ulcers, left great toe, right anterior foot and outer ankle wounds, nonhealing however patient refused. He was seen by psychiatry and patient was deemed as unable to have capacity to make decisions. Patient's spouse understood overall poor prognosis however did not wish to pursue medication either. -Discussed with neurology, recommended no vancomycin or doxycycline as it may exacerbate myasthenia gravis - continue IV Ancef, wound care consulted, transition to oral Keflex at the time of DC.  - Blood cultures negative to date  Acute on chronic anemia - Anemia of chronic disease due to his ESRD however patient had bleeding from his elbow yesterday, supratherapeutic INR, hemoglobin 7.9->10.2. - Transfuse 1 unit packed RBC during dialysis on 3/6.  Atrial fibrillation, permanent (HCC) -Currently rate controlled, continue Coreg  - INR supratherapeutic 5.9 at the time of admission with bleeding from the  right elbow abrasion, received vitamin K 2.5 mg PO X1 - INR is 2.15 today, Coumadin per pharmacy  Dyslipidemia -Continue  Zetia,fenofibrate  Dysphagia - continue renal diet, SLP evaluation  Generalized weakness - PT/OT evaluation   Code Status: Full CODE STATUS DVT Prophylaxis:  Warfarin per pharmacy Family Communication: Discussed in detail with the patient, all imaging results, lab results explained to the patient    Disposition Plan: Needs IVIG for total 5 days  Time Spent in minutes   25 minutes  Procedures:  Hemodialysis  Consultants:   Neurology Nephrology  Antimicrobials:   IV Ancef 3/4->   Subjective: Patient seen and evaluated today with no new acute complaints or concerns. He feels as though his strength is returning. No acute concerns or events noted overnight.  Objective: Vitals:   11/19/17 2100 11/19/17 2130 11/20/17 0416 11/20/17 1030  BP: 132/61 138/69 (!) 152/62 (!) 159/65  Pulse: (!) 103 (!) 104 89 (!) 101  Resp: 20 (!) 22 18 18   Temp: 98.3 F (36.8 C) 98.4 F (36.9 C) 98.4 F (36.9 C) 98.6 F (37 C)  TempSrc: Oral Oral Oral Oral  SpO2: 97% 99% 100% 100%  Weight:      Height:        Intake/Output Summary (Last 24 hours) at 11/20/2017 1340 Last data filed at 11/20/2017 1052 Gross per 24 hour  Intake 1510.23 ml  Output 2900 ml  Net -1389.77 ml   Filed Weights   11/18/17 2032 11/19/17 1302 11/19/17 1720  Weight: 73.8 kg (162 lb 11.2 oz) 74 kg (163 lb 2.3 oz) 71.6 kg (157 lb 13.6 oz)    Examination:  General exam: Appears calm and comfortable  Respiratory system: Clear to auscultation. Respiratory effort normal. Cardiovascular system: S1 & S2 heard, RRR. No JVD, murmurs, rubs, gallops or clicks. No pedal edema. Gastrointestinal system: Abdomen is nondistended, soft and nontender. No organomegaly or masses felt. Normal bowel sounds heard. Central nervous system: Alert and oriented. No focal neurological deficits. Extremities: Symmetric 5 x 5 power. Skin: Skin breakdown in LE wrapped in c/d/i gauze Psychiatry: Judgement and insight appear  normal. Mood & affect appropriate.     Data Reviewed: I have personally reviewed following labs and imaging studies  CBC: Recent Labs  Lab 11/17/17 1345 11/17/17 1353 11/18/17 0123 11/20/17 0611  WBC 14.1*  --  11.1* 11.1*  NEUTROABS 13.0*  --   --   --   HGB 8.2* 10.5* 7.9* 10.2*  HCT 27.9* 31.0* 26.0* 32.4*  MCV 100.0  --  99.2 95.9  PLT 335  --  326 109   Basic Metabolic Panel: Recent Labs  Lab 11/17/17 1345 11/17/17 1353 11/18/17 0123 11/20/17 0611  NA 132* 131* 131* 133*  K 4.5 4.4 4.4 3.0*  CL 91* 91* 92* 93*  CO2 24  --  23 28  GLUCOSE 276* 261* 183* 101*  BUN 83* 83* 90* 20  CREATININE 8.36* 8.50* 8.76* 3.25*  CALCIUM 9.3  --  9.0 9.0  PHOS 2.9  --   --   --    GFR: Estimated Creatinine Clearance: 18.7 mL/min (A) (by C-G formula based on SCr of 3.25 mg/dL (H)). Liver Function Tests: Recent Labs  Lab 11/17/17 1345  AST 22  ALT 30  ALKPHOS 50  BILITOT 0.8  PROT 5.9*  ALBUMIN 2.2*   No results for input(s): LIPASE, AMYLASE in the last 168 hours. No results for input(s): AMMONIA in the last 168 hours. Coagulation Profile: Recent Labs  Lab 11/17/17 1530 11/18/17 1542 11/19/17 0534 11/20/17 0611  INR 5.99* 4.41* 3.20 2.15   Cardiac Enzymes: No results for input(s): CKTOTAL, CKMB, CKMBINDEX, TROPONINI in the last 168 hours. BNP (last 3 results) No results for input(s): PROBNP in the last 8760 hours. HbA1C: Recent Labs    11/18/17 0121  HGBA1C 8.1*   CBG: Recent Labs  Lab 11/19/17 1209 11/19/17 1821 11/19/17 2123 11/20/17 0740 11/20/17 1146  GLUCAP 99 140* 162* 104* 164*   Lipid Profile: No results for input(s): CHOL, HDL, LDLCALC, TRIG, CHOLHDL, LDLDIRECT in the last 72 hours. Thyroid Function Tests: No results for input(s): TSH, T4TOTAL, FREET4, T3FREE, THYROIDAB in the last 72 hours. Anemia Panel: No results for input(s): VITAMINB12, FOLATE, FERRITIN, TIBC, IRON, RETICCTPCT in the last 72 hours. Sepsis Labs: Recent Labs  Lab  11/17/17 1353  LATICACIDVEN 1.11    Recent Results (from the past 240 hour(s))  Blood Culture (routine x 2)     Status: None (Preliminary result)   Collection Time: 11/17/17  1:30 PM  Result Value Ref Range Status   Specimen Description BLOOD LEFT ANTECUBITAL  Final   Special Requests IN PEDIATRIC BOTTLE Blood Culture adequate volume  Final   Culture   Final    NO GROWTH 3 DAYS Performed at [facility], [place] [address] [place], [place] [address]    Report Status PENDING  Incomplete  Blood Culture (routine x 2)     Status: None (Preliminary result)   Collection Time: 11/17/17  1:45 PM  Result Value Ref Range Status   Specimen Description BLOOD LEFT HAND  Final   Special Requests IN PEDIATRIC BOTTLE Blood Culture adequate volume  Final   Culture   Final    NO GROWTH 3 DAYS Performed at [facility], [place] [address], [place], [place] [address]    Report Status PENDING  Incomplete         Radiology Studies: No results found.      Scheduled Meds: . benzonatate  100 mg Oral TID  . collagenase   Topical Daily  . ezetimibe  10 mg Oral Daily  . feeding supplement (ENSURE ENLIVE)  237 mL Oral BID BM  . fenofibrate  160 mg Oral Daily  . finasteride  5 mg Oral Daily  . Immune Globulin 10%  400 mg/kg Intravenous Q24 Hr x 5  . insulin aspart  0-5 Units Subcutaneous QHS  . insulin aspart  0-9 Units Subcutaneous TID WC  . insulin aspart  3 Units Subcutaneous TID WC  . multivitamin  1 tablet Oral QHS  . predniSONE  20 mg Oral Q breakfast  . warfarin  1 mg Oral ONCE-1800  . Warfarin - Pharmacist Dosing Inpatient   Does not apply q1800   Continuous Infusions: .  ceFAZolin (ANCEF) IV Stopped (11/19/17 2205)     LOS: 3 days    Time spent: 30 minutes    Hero Mccathern Darleen Crocker, DO [facility] Pager [phone]  If 7PM-7AM, please contact night-coverage [email or web address] Password [email or web address] 11/20/2017, 1:40 PM

## 2017-11-20 NOTE — Progress Notes (Signed)
ANTICOAGULATION CONSULT NOTE - Initial Consult  Pharmacy Consult for Coumadin Indication: atrial fibrillation  Allergies  Allergen Reactions  . Amlodipine Besylate Swelling    Angioedema   . Lisinopril Other (See Comments)    Renal insufficiency  . Statins Other (See Comments)    Muscle aches  . Actos [Pioglitazone]     Chest pain  . Ropinirole Other (See Comments)    Causes excessive grogginess the following morning after taking  . Tape Other (See Comments)    SKIN IS THIN AND TAPE WILL ACTUALLY TEAR IT; PLEASE USE COBAN WRAP INSTEAD!!   Patient Measurements: Height: 6' (182.9 cm) Weight: 157 lb 13.6 oz (71.6 kg) IBW/kg (Calculated) : 77.6  Assessment: 82 yo M on Coumadin 1mg  daily PTA for Afib per family. INR on admit elevated at 5.99. Received vitamin K PO. CHADsVASC at least 5. INR today is down to 2.15. Hgb 7.9, plts wnl.  Goal of Therapy:  INR 2-3 Monitor platelets by anticoagulation protocol: Yes   Plan:  Give Coumadin 1mg  PO x 1 today Monitor daily INR, CBC, s/s of bleed  May need to bridge if INR goes down further  Merina Behrendt J 11/20/2017,8:22 AM

## 2017-11-20 NOTE — Progress Notes (Signed)
  Speech Language Pathology Treatment: Dysphagia  Patient Details Name: TYUS KALLAM MRN: [id number] DOB: June 16, 1936 Today's Date: 11/20/2017 Time: 1200-1212 SLP Time Calculation (min) (ACUTE ONLY): 12 min  Assessment / Plan / Recommendation Clinical Impression  Pt was seen for skilled ST targeting dysphagia goals.  Pt a bit clearer today in comparison to over the last two days.  Wife was present for treatment today and reported good toleration of breakfast meal ([employer] ham biscuit).  Pt consumed a functional snack of regular textures and thin liquids with mod I use of swallowing precaution and no overt s/s of aspiration with solids or liquids.  Discussed strategies to maximize pt's safety with PO intake with pt and wife, emphasizing upright as possible or out of bed for meals, limiting distractions during meal times, small bites/sips, slow rate.  SLP also provided skilled education regarding s/s of developing infectious process, including fever, increased chest congestion, and increased work of breathing and to contact MD as soon as possible should pt begin to develop this combination of symptoms.  All questions were answered to their satisfaction at this time.  No further ST needs were indicated at this time.  HPI HPI: Patient is a 82 year old male with ESRD on HD, MWF, atrial fibrillation on Coumadin, history of myasthenia gravis on prednisone, CAD, history of CVA, second-degree heart block with pacemaker, diabetes mellitus presented to ED with worsening weakness. Patient reported that he has noticed worsening weakness in the last 3 weeks. At baseline, patient reports that he is unable to ambulate and uses wheelchair. Today he was found to have increased generalized weakness by his home health nurse and he was unable to transfer to his wheelchair. Patient also reported that he had difficulty keeping his head up due to weakness and did not go for his regular HD today. Patient reported a week of cough  and congestion but no fevers chills, no significant sputum production.      SLP Plan  Continue with current plan of care       Recommendations  Diet recommendations: Regular;Thin liquid Liquids provided via: Cup;Straw Medication Administration: Whole meds with liquid Supervision: Patient able to self feed;Intermittent supervision to cue for compensatory strategies Compensations: Slow rate;Small sips/bites;Follow solids with liquid Postural Changes and/or Swallow Maneuvers: Upright 30-60 min after meal;Seated upright 90 degrees                Oral Care Recommendations: Oral care BID Follow up Recommendations: None SLP Visit Diagnosis: Dysphagia, unspecified (R13.10) Plan: Continue with current plan of care       GO                Riggins Cisek, Selinda Orion 11/20/2017, 12:15 PM

## 2017-11-20 NOTE — Procedures (Addendum)
NIF (-30) and VC (1.9) obtain from pt with good effort.    HR-103 RR-18 SpO2-100

## 2017-11-20 NOTE — Progress Notes (Addendum)
Subjective: Feeling good, feels strength improving.  Exam: Vitals:   11/19/17 2130 11/20/17 0416  BP: 138/69 (!) 152/62  Pulse: (!) 104 89  Resp: (!) 22 18  Temp: 98.4 F (36.9 C) 98.4 F (36.9 C)  SpO2: 99% 100%   Gen: Chronically ill-appearing, In bed, NAD. Resp: non-labored breathing, no acute distress Abd: soft, nt Extremities: muscle wasting lower extremities  Neuro: MS: alert to person and place but not time CN: PERRL, EOMI  Motor: strength improved in all extremities including neck flexion at 5/5 today. 5/5 UEs at biceps, triceps, and deltoids. Hip flexion 5/5 bilaterally when giving good effort. Knee extension 4/5 bilaterally. ADF and APF 5/5 bilaterally. FNF intact bilaterally.  Pertinent Labs: BMET    Component Value Date/Time   NA 131 (L) 11/18/2017 0123   NA 140 02/28/2017 0916   NA 138 10/17/2016 1303   K 4.4 11/18/2017 0123   K 5.1 10/17/2016 1303   CL 92 (L) 11/18/2017 0123   CO2 23 11/18/2017 0123   CO2 18 (L) 10/17/2016 1303   GLUCOSE 183 (H) 11/18/2017 0123   GLUCOSE 197 (H) 10/17/2016 1303   BUN 90 (H) 11/18/2017 0123   BUN 46 (H) 02/28/2017 0916   BUN 71.0 (H) 10/17/2016 1303   CREATININE 8.76 (H) 11/18/2017 0123   CREATININE 5.9 (HH) 10/17/2016 1303   CALCIUM 9.0 11/18/2017 0123   CALCIUM 8.9 10/17/2016 1303   GFRNONAA 5 (L) 11/18/2017 0123   GFRAA 6 (L) 11/18/2017 0123   CBC    Component Value Date/Time   WBC 11.1 (H) 11/20/2017 0611   RBC 3.38 (L) 11/20/2017 0611   HGB 10.2 (L) 11/20/2017 0611   HGB 12.0 (L) 10/17/2016 1303   HCT 32.4 (L) 11/20/2017 0611   HCT 34.4 (L) 10/17/2016 1303   PLT 260 11/20/2017 0611   PLT 198 10/17/2016 1303   MCV 95.9 11/20/2017 0611   MCV 86.6 10/17/2016 1303   MCH 30.2 11/20/2017 0611   MCHC 31.5 11/20/2017 0611   RDW 18.6 (H) 11/20/2017 0611   RDW 13.3 10/17/2016 1303   LYMPHSABS 0.6 (L) 11/17/2017 1345   LYMPHSABS 1.0 10/17/2016 1303   MONOABS 0.5 11/17/2017 1345   MONOABS 0.5 10/17/2016 1303    EOSABS 0.0 11/17/2017 1345   EOSABS 0.2 10/17/2016 1303   BASOSABS 0.0 11/17/2017 1345   BASOSABS 0.0 10/17/2016 1303   Hgb A1c 8.1  Impression:  82 y.o. 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 Started on IVIG 11/17/17. He is responding well to IVIG with improved strength.  Recommendations: 1) Continue IVIG 400 mg/kg q24h x 5 days (day 4 of 5), may need intermittent IVIG on an outpatient basis 2) Continue Prednisone 20 mg daily 3) HD per Nephrology     [name], MD Internal Medicine PGY-3

## 2017-11-20 NOTE — Progress Notes (Signed)
Subjective:  Looks and feels a bit stronger   Objective Vital signs in last 24 hours: Vitals:   11/19/17 2100 11/19/17 2130 11/20/17 0416 11/20/17 1030  BP: 132/61 138/69 (!) 152/62 (!) 159/65  Pulse: (!) 103 (!) 104 89 (!) 101  Resp: 20 (!) 22 18 18   Temp: 98.3 F (36.8 C) 98.4 F (36.9 C) 98.4 F (36.9 C) 98.6 F (37 C)  TempSrc: Oral Oral Oral Oral  SpO2: 97% 99% 100% 100%  Weight:      Height:       Weight change: -1.6 kg (-8.4 oz)  Physical Exam: General: alert chronically ill elderly WM , NAD  Heart:RRR, No m, r, g Lungs: CTA bilat  Abdomen: BS pos ,soft, NT, ND  Extremities: No pedal edema, +foot ulcers bilat feet Dialysis Access: RUA AVF pos bruit   Dialysis:[facility] MWF 4h 2/2.25  73.5kg  P2 Hep none RUA AVF   -Mircera 200 q2wk ( last given 11/14/17)   Problem/Plan: 1. Myasthenia gravis - progressive debility and gen'd weakness.  Per neuro getting course of IVIG, on baseline prednisone  2  Bilat foot ulcers/ R lat malleolus osteo - vascular surg suggested bilat amputation last admission but patient declined.  Reset admit dc'd on Cefazolin for osteo w HD 6 weeks total, last dose was to be 12/17/17. Here is now getting Fortaz 1 gm q day  3 ESRD HD MWF. HD tomorrow.  4  Afib - on coumadin, BB 5 Vol / HTN - bp stable, slightly under dry 6 Anemia ckd - hgb 7.9  cont esa  Last given 11/14/17, 7. MBD ckd - check phos, hold binders for now   Kelly Splinter MD [place] pager [phone]   11/20/2017, 11:25 AM    Labs: Basic Metabolic Panel: Recent Labs  Lab 11/17/17 1345 11/17/17 1353 11/18/17 0123  NA 132* 131* 131*  K 4.5 4.4 4.4  CL 91* 91* 92*  CO2 24  --  23  GLUCOSE 276* 261* 183*  BUN 83* 83* 90*  CREATININE 8.36* 8.50* 8.76*  CALCIUM 9.3  --  9.0  PHOS 2.9  --   --    Liver Function Tests: Recent Labs  Lab 11/17/17 1345  AST 22  ALT 30  ALKPHOS 50  BILITOT 0.8  PROT 5.9*  ALBUMIN 2.2*   No results for  input(s): LIPASE, AMYLASE in the last 168 hours. No results for input(s): AMMONIA in the last 168 hours. CBC: Recent Labs  Lab 11/17/17 1345 11/17/17 1353 11/18/17 0123 11/20/17 0611  WBC 14.1*  --  11.1* 11.1*  NEUTROABS 13.0*  --   --   --   HGB 8.2* 10.5* 7.9* 10.2*  HCT 27.9* 31.0* 26.0* 32.4*  MCV 100.0  --  99.2 95.9  PLT 335  --  326 260   Cardiac Enzymes: No results for input(s): CKTOTAL, CKMB, CKMBINDEX, TROPONINI in the last 168 hours. CBG: Recent Labs  Lab 11/19/17 0729 11/19/17 1209 11/19/17 1821 11/19/17 2123 11/20/17 0740  GLUCAP 197* 99 140* 162* 104*    Studies/Results: No results found. Medications: .  ceFAZolin (ANCEF) IV Stopped (11/19/17 2205)   . benzonatate  100 mg Oral TID  . collagenase   Topical Daily  . ezetimibe  10 mg Oral Daily  . feeding supplement (ENSURE ENLIVE)  237 mL Oral BID BM  . fenofibrate  160 mg Oral Daily  . finasteride  5 mg Oral Daily  . Immune Globulin 10%  400  mg/kg Intravenous Q24 Hr x 5  . insulin aspart  0-5 Units Subcutaneous QHS  . insulin aspart  0-9 Units Subcutaneous TID WC  . insulin aspart  3 Units Subcutaneous TID WC  . multivitamin  1 tablet Oral QHS  . predniSONE  20 mg Oral Q breakfast  . warfarin  1 mg Oral ONCE-1800  . Warfarin - Pharmacist Dosing Inpatient   Does not apply [phone]

## 2017-11-21 LAB — BASIC METABOLIC PANEL
BUN: 41 mg/dL — ABNORMAL HIGH (ref 6–20)
Creatinine, Ser: 5.29 mg/dL — ABNORMAL HIGH (ref 0.61–1.24)
GFR calc non Af Amer: 9 mL/min — ABNORMAL LOW (ref >60)
Glucose, Bld: 94 mg/dL (ref 65–99)
Sodium: 131 mmol/L — ABNORMAL LOW (ref 135–145)

## 2017-11-21 LAB — BASIC METABOLIC PANEL WITH GFR
Anion gap: 12 (ref 5–15)
CO2: 26 mmol/L (ref 22–32)
Calcium: 8.9 mg/dL (ref 8.9–10.3)
Chloride: 93 mmol/L — ABNORMAL LOW (ref 101–111)
GFR calc Af Amer: 11 mL/min — ABNORMAL LOW (ref >60)
Potassium: 2.7 mmol/L — CL (ref 3.5–5.1)

## 2017-11-21 LAB — GLUCOSE, CAPILLARY
GLUCOSE-CAPILLARY: 172 mg/dL — AB (ref 65–99)
GLUCOSE-CAPILLARY: 96 mg/dL (ref 65–99)
Glucose-Capillary: 83 mg/dL (ref 65–99)

## 2017-11-21 MED ORDER — IMMUNE GLOBULIN (HUMAN) 10 GM/100ML IV SOLN
400.0000 mg/kg | Freq: Once | INTRAVENOUS | Status: AC
  Administered 2017-11-21: 30 g via INTRAVENOUS
  Filled 2017-11-21: qty 100

## 2017-11-21 MED ORDER — ACETAMINOPHEN 325 MG PO TABS
ORAL_TABLET | ORAL | Status: AC
  Filled 2017-11-21: qty 2

## 2017-11-21 MED ORDER — WARFARIN SODIUM 1 MG PO TABS
1.0000 mg | ORAL_TABLET | Freq: Once | ORAL | Status: AC
  Administered 2017-11-21: 1 mg via ORAL
  Filled 2017-11-21: qty 1

## 2017-11-21 NOTE — Progress Notes (Signed)
Subjective: Seen in dialysis, feeling well.  Exam: Vitals:   11/21/17 1100 11/21/17 1259  BP: 137/60 (!) 138/48  Pulse: 88 95  Resp: 20 18  Temp:  97.6 F (36.4 C)  SpO2:  97%   Gen: Chronically ill-appearing, In bed during dialysis. Resp: non-labored breathing, no acute distress Abd: soft, nt Extremities: muscle wasting lower extremities  Neuro:  MS: alert to person and place but not time CN: PERRL, EOMI  Motor: Full exam limited while on dialysis. Neck flexion at 5/5 today. 5/5 LUE at biceps, triceps, and deltoids. Hip flexion 5/5 bilaterally when giving good effort. Knee extension 4/5 bilaterally. ADF and APF 5/5 bilaterally.  Pertinent Labs: BMET    Component Value Date/Time   NA 131 (L) 11/21/2017 0735   NA 140 02/28/2017 0916   NA 138 10/17/2016 1303   K 2.7 (LL) 11/21/2017 0735   K 5.1 10/17/2016 1303   CL 93 (L) 11/21/2017 0735   CO2 26 11/21/2017 0735   CO2 18 (L) 10/17/2016 1303   GLUCOSE 94 11/21/2017 0735   GLUCOSE 197 (H) 10/17/2016 1303   BUN 41 (H) 11/21/2017 0735   BUN 46 (H) 02/28/2017 0916   BUN 71.0 (H) 10/17/2016 1303   CREATININE 5.29 (H) 11/21/2017 0735   CREATININE 5.9 (HH) 10/17/2016 1303   CALCIUM 8.9 11/21/2017 0735   CALCIUM 8.9 10/17/2016 1303   GFRNONAA 9 (L) 11/21/2017 0735   GFRAA 11 (L) 11/21/2017 0735   CBC    Component Value Date/Time   WBC 11.1 (H) 11/20/2017 0611   RBC 3.38 (L) 11/20/2017 0611   HGB 10.2 (L) 11/20/2017 0611   HGB 12.0 (L) 10/17/2016 1303   HCT 32.4 (L) 11/20/2017 0611   HCT 34.4 (L) 10/17/2016 1303   PLT 260 11/20/2017 0611   PLT 198 10/17/2016 1303   MCV 95.9 11/20/2017 0611   MCV 86.6 10/17/2016 1303   MCH 30.2 11/20/2017 0611   MCHC 31.5 11/20/2017 0611   RDW 18.6 (H) 11/20/2017 0611   RDW 13.3 10/17/2016 1303   LYMPHSABS 0.6 (L) 11/17/2017 1345   LYMPHSABS 1.0 10/17/2016 1303   MONOABS 0.5 11/17/2017 1345   MONOABS 0.5 10/17/2016 1303   EOSABS 0.0 11/17/2017 1345   EOSABS 0.2 10/17/2016 1303    BASOSABS 0.0 11/17/2017 1345   BASOSABS 0.0 10/17/2016 1303   Hgb A1c 8.1  Impression:  82 y.o. male with antibody positive Myasthenia gravis on Prednisone, T2DM, ESRD on MWF HD, Afib/flutter on Coumadin, HFpEF, CAD, CVA, 2nd degree heart block s/p PPM, Thyroid nodules who presented with Myasthenia gravis exacerbation. Started on IVIG 11/17/17. He is responding well to IVIG with improved strength.  Recommendations: 1) Complete IVIG 400 mg/kg q24h x 5 days (day 5 of 5; 5th dose ordered for today), may need intermittent IVIG on an outpatient basis 2) Continue Prednisone 20 mg daily 3) HD per Nephrology  4) Outpatient f/u with Dr. Eben Burow, MD Internal Medicine PGY-3

## 2017-11-21 NOTE — Progress Notes (Signed)
Notified IV team regarding administration of immunoglobulin through IV team consult order.

## 2017-11-21 NOTE — Care Management Important Message (Signed)
Important Message  Patient Details  Name: STPEHEN PETITJEAN MRN: [id number] Date of Birth: 1935/10/22   Medicare Important Message Given:  Yes  Kristen Cardinal, RN 11/21/2017, 4:06 PM

## 2017-11-21 NOTE — Procedures (Signed)
NIF (-40) and VC (1.9L) obtained from pt with good effort.

## 2017-11-21 NOTE — Progress Notes (Signed)
PROGRESS NOTE    James David  VHQ:[id number] DOB: 1935-10-08 DOA: 11/17/2017 PCP: [name], MD    Brief Narrative:  82 year old with end-stage renal disease on Monday Wednesday Friday dialysis, atrial fibrillation on warfarin, myasthenia gravis on prednisone, prior stroke, secondary heart block status post pacer, type 2 diabetes, hyperlipidemia who was admitted with worsening distal greater than proximal muscle weakness consistent with exacerbation of myasthenia gravis.   Assessment & Plan:   Principal Problem:   Myasthenia gravis in crisis [facility]) Active Problems:   Diabetes mellitus due to underlying condition with diabetic nephropathy ([place])   Chronic diastolic heart failure (HCC)   ESRD (end stage renal disease) on dialysis (HCC)   Atrial fibrillation, permanent ([place])   Dyslipidemia   Dysphagia   Pressure injury of skin   Weakness   Generalized weakness   Myasthenia exacerbation ([place])   #) Myasthenia gravis exacerbation: Patient is improving symptomatically and can lift his neck and his arms up.  He continues to be unable to lift him self up off the bed with his torso.  This is the limiting factor and discharge him home as his wife is unable to get him to dialysis in this way. -Today is day 5 of 5 IVIG -Neurology following appreciate recommendations -patient is adamant against going to a skilled nursing facility and he does want to go home.  Discussed with the wife and she will discussed with the family about goals of care -Continue prednisone 20 mg daily  #) Paroxysmal atrial fibrillation: The -Continue carvedilol 25 mg twice a day -Continue warfarin per pharmacy dosing  #) ESRD: -Nephrology following appreciate recommendations -Continue PhosLo 3 times daily with meals -Continue vitamin supplementation  #) Chronic lower extremity wounds: Patient has nonhealing ulcers on bilateral heels, left great toe, right anterior foot, bilateral malleoli that are  nonhealing.  Vascular surgery performed abdominal aortogram on 10/22/2017 that did not show any significant stenosis that would be contributing to these nonhealing ulcers.  They recommended bilateral amputations however he has refused this.  He was discharged home in late February with IV ceftazidime and doxycycline however there was a concern that these exacerbated his underlying myasthenia gravis. -Continue IV cefazolin per ID recommendations -Wound care consult -Continue Santyl ointment topically  #) Hyperlipidemia: -Continue Zetia 10 mg -Continue fenofibrate 160 mg   #) Hypertension: -Continue beta-blocker -Continue aspirin 81 mg  Fluids: Restrict Liquids: Monitor and supplement Nutrition: Renal diet  Prophylaxis: On warfarin  Disposition: Pending completion of IVIG  Full code   Consultants:   Nephrology  Neurology  Procedures:   Dialysis  Antimicrobials: (specify start and planned stop date. Auto populated tables are space occupying and do not give end dates)  IV cefazolin started 11/17/2017   Subjective: Patient reports he is doing well.  He does not have any complaints at this time.  He appears to be quite confused and altered.  He reports that he does not have any pain anywhere.  Objective: Vitals:   11/21/17 1100 11/21/17 1130 11/21/17 1200 11/21/17 1259  BP: 137/60 130/60 (!) 130/50 (!) 138/48  Pulse: 88 88 90 95  Resp: 20 18 18 18   Temp:   97.8 F (36.6 C) 97.6 F (36.4 C)  TempSrc:   Oral Oral  SpO2:   98% 97%  Weight:   72 kg (158 lb 11.7 oz)   Height:        Intake/Output Summary (Last 24 hours) at 11/21/2017 1549 Last data filed at 11/21/2017 1400  Gross per 24 hour  Intake 449 ml  Output 1050 ml  Net -601 ml   Filed Weights   11/20/17 2149 11/21/17 0800 11/21/17 1200  Weight: 72.6 kg (160 lb 0.9 oz) 73 kg (160 lb 15 oz) 72 kg (158 lb 11.7 oz)    Examination:  General exam: Appears calm and comfortable  Respiratory system: Clear to  auscultation. Respiratory effort normal. Cardiovascular system: Regular rate and rhythm, referred murmur from fistula Gastrointestinal system: Abdomen is nondistended, soft and nontender. No organomegaly or masses felt. Normal bowel sounds heard. Central nervous system: A alert, oriented only to self.  Moving all extremities Extremities: No lower extremity edema, right upper extremity fistula with palpable thrill and audible bruit Skin: Numerous bilateral ulcers with dressings, Psychiatry: Judgement and insight appear normal. Mood & affect appropriate.     Data Reviewed: I have personally reviewed following labs and imaging studies  CBC: Recent Labs  Lab 11/17/17 1345 11/17/17 1353 11/18/17 0123 11/20/17 0611  WBC 14.1*  --  11.1* 11.1*  NEUTROABS 13.0*  --   --   --   HGB 8.2* 10.5* 7.9* 10.2*  HCT 27.9* 31.0* 26.0* 32.4*  MCV 100.0  --  99.2 95.9  PLT 335  --  326 275   Basic Metabolic Panel: Recent Labs  Lab 11/17/17 1345 11/17/17 1353 11/18/17 0123 11/20/17 0611 11/21/17 0735  NA 132* 131* 131* 133* 131*  K 4.5 4.4 4.4 3.0* 2.7*  CL 91* 91* 92* 93* 93*  CO2 24  --  23 28 26   GLUCOSE 276* 261* 183* 101* 94  BUN 83* 83* 90* 20 41*  CREATININE 8.36* 8.50* 8.76* 3.25* 5.29*  CALCIUM 9.3  --  9.0 9.0 8.9  PHOS 2.9  --   --   --   --    GFR: Estimated Creatinine Clearance: 11.5 mL/min (A) (by C-G formula based on SCr of 5.29 mg/dL (H)). Liver Function Tests: Recent Labs  Lab 11/17/17 1345  AST 22  ALT 30  ALKPHOS 50  BILITOT 0.8  PROT 5.9*  ALBUMIN 2.2*   No results for input(s): LIPASE, AMYLASE in the last 168 hours. No results for input(s): AMMONIA in the last 168 hours. Coagulation Profile: Recent Labs  Lab 11/17/17 1530 11/18/17 1542 11/19/17 0534 11/20/17 0611  INR 5.99* 4.41* 3.20 2.15   Cardiac Enzymes: No results for input(s): CKTOTAL, CKMB, CKMBINDEX, TROPONINI in the last 168 hours. BNP (last 3 results) No results for input(s): PROBNP in  the last 8760 hours. HbA1C: No results for input(s): HGBA1C in the last 72 hours. CBG: Recent Labs  Lab 11/20/17 0740 11/20/17 1146 11/20/17 1627 11/20/17 2147 11/21/17 1258  GLUCAP 104* 164* 336* 162* 83   Lipid Profile: No results for input(s): CHOL, HDL, LDLCALC, TRIG, CHOLHDL, LDLDIRECT in the last 72 hours. Thyroid Function Tests: No results for input(s): TSH, T4TOTAL, FREET4, T3FREE, THYROIDAB in the last 72 hours. Anemia Panel: No results for input(s): VITAMINB12, FOLATE, FERRITIN, TIBC, IRON, RETICCTPCT in the last 72 hours. Sepsis Labs: Recent Labs  Lab 11/17/17 1353  LATICACIDVEN 1.11    Recent Results (from the past 240 hour(s))  Blood Culture (routine x 2)     Status: None (Preliminary result)   Collection Time: 11/17/17  1:30 PM  Result Value Ref Range Status   Specimen Description BLOOD LEFT ANTECUBITAL  Final   Special Requests IN PEDIATRIC BOTTLE Blood Culture adequate volume  Final   Culture   Final    NO GROWTH  4 DAYS Performed at [facility], [place] [address] [place], [place] [address]    Report Status PENDING  Incomplete  Blood Culture (routine x 2)     Status: None (Preliminary result)   Collection Time: 11/17/17  1:45 PM  Result Value Ref Range Status   Specimen Description BLOOD LEFT HAND  Final   Special Requests IN PEDIATRIC BOTTLE Blood Culture adequate volume  Final   Culture   Final    NO GROWTH 4 DAYS Performed at [facility], [place] [address] [place], [place] [address]    Report Status PENDING  Incomplete         Radiology Studies: No results found.      Scheduled Meds: . benzonatate  100 mg Oral TID  . collagenase   Topical Daily  . ezetimibe  10 mg Oral Daily  . feeding supplement (ENSURE ENLIVE)  237 mL Oral BID BM  . fenofibrate  160 mg Oral Daily  . finasteride  5 mg Oral Daily  . Immune Globulin 10%  400 mg/kg Intravenous Once  . insulin aspart  0-5 Units Subcutaneous QHS  . insulin aspart  0-9 Units  Subcutaneous TID WC  . insulin aspart  3 Units Subcutaneous TID WC  . multivitamin  1 tablet Oral QHS  . predniSONE  20 mg Oral Q breakfast  . warfarin  1 mg Oral ONCE-1800  . Warfarin - Pharmacist Dosing Inpatient   Does not apply q1800   Continuous Infusions: .  ceFAZolin (ANCEF) IV 1 g (11/20/17 1806)     LOS: 4 days    Time spent: 83    Cristy Folks, MD [facility]  If 7PM-7AM, please contact night-coverage [email or web address] Password [facility] 11/21/2017, 3:49 PM

## 2017-11-21 NOTE — Progress Notes (Signed)
ANTICOAGULATION CONSULT NOTE - Initial Consult  Pharmacy Consult for Coumadin Indication: atrial fibrillation  Allergies  Allergen Reactions  . Amlodipine Besylate Swelling    Angioedema   . Lisinopril Other (See Comments)    Renal insufficiency  . Statins Other (See Comments)    Muscle aches  . Actos [Pioglitazone]     Chest pain  . Ropinirole Other (See Comments)    Causes excessive grogginess the following morning after taking  . Tape Other (See Comments)    SKIN IS THIN AND TAPE WILL ACTUALLY TEAR IT; PLEASE USE COBAN WRAP INSTEAD!!   Patient Measurements: Height: 6' (182.9 cm) Weight: 160 lb 15 oz (73 kg) IBW/kg (Calculated) : 77.6  Assessment: 82 yo M on Coumadin 1mg  daily PTA for Afib per family. INR on admit elevated at 5.99. Received vitamin K PO. CHADsVASC at least 5. INR down to 2.15 yesterday, unable to get INR today, will follow up tomorrow. Hgb 10.2, plts wnl.  Goal of Therapy:  INR 2-3 Monitor platelets by anticoagulation protocol: Yes   Plan:  Give Coumadin 1mg  PO x 1 today Monitor daily INR, CBC, s/s of bleed   Nekhi Liwanag J 11/21/2017,1:05 PM

## 2017-11-21 NOTE — Procedures (Signed)
Seen on HD, confused today, BP's ok, low UF goal. Access arm restrained due to agitation.   I was present at this dialysis session, have reviewed the session itself and made  appropriate changes Kelly Splinter MD [place] pager [phone]   11/21/2017, 12:15 PM

## 2017-11-22 LAB — CULTURE, BLOOD (ROUTINE X 2)
CULTURE: NO GROWTH
Culture: NO GROWTH
SPECIAL REQUESTS: ADEQUATE
Special Requests: ADEQUATE

## 2017-11-22 LAB — BASIC METABOLIC PANEL WITH GFR
Calcium: 8.5 mg/dL — ABNORMAL LOW (ref 8.9–10.3)
GFR calc Af Amer: 18 mL/min — ABNORMAL LOW (ref >60)
GFR calc non Af Amer: 15 mL/min — ABNORMAL LOW (ref >60)
Sodium: 131 mmol/L — ABNORMAL LOW (ref 135–145)

## 2017-11-22 LAB — CBC
HCT: 34.1 % — ABNORMAL LOW (ref 39.0–52.0)
Hemoglobin: 10.8 g/dL — ABNORMAL LOW (ref 13.0–17.0)
MCH: 30.7 pg (ref 26.0–34.0)
MCHC: 31.7 g/dL (ref 30.0–36.0)
MCV: 96.9 fL (ref 78.0–100.0)
Platelets: 209 10*3/uL (ref 150–400)
RBC: 3.52 MIL/uL — ABNORMAL LOW (ref 4.22–5.81)
RDW: 19.3 % — ABNORMAL HIGH (ref 11.5–15.5)
WBC: 10.7 K/uL — ABNORMAL HIGH (ref 4.0–10.5)

## 2017-11-22 LAB — BASIC METABOLIC PANEL
Anion gap: 13 (ref 5–15)
BUN: 22 mg/dL — ABNORMAL HIGH (ref 6–20)
CO2: 25 mmol/L (ref 22–32)
Chloride: 93 mmol/L — ABNORMAL LOW (ref 101–111)
Creatinine, Ser: 3.52 mg/dL — ABNORMAL HIGH (ref 0.61–1.24)
Glucose, Bld: 160 mg/dL — ABNORMAL HIGH (ref 65–99)
Potassium: 3.2 mmol/L — ABNORMAL LOW (ref 3.5–5.1)

## 2017-11-22 LAB — GLUCOSE, CAPILLARY
Glucose-Capillary: 128 mg/dL — ABNORMAL HIGH (ref 65–99)
Glucose-Capillary: 157 mg/dL — ABNORMAL HIGH (ref 65–99)
Glucose-Capillary: 248 mg/dL — ABNORMAL HIGH (ref 65–99)
Glucose-Capillary: 126 mg/dL — ABNORMAL HIGH (ref 65–99)

## 2017-11-22 LAB — MAGNESIUM: Magnesium: 1.8 mg/dL (ref 1.7–2.4)

## 2017-11-22 LAB — PROTIME-INR
INR: 3.94
Prothrombin Time: 38.2 s — ABNORMAL HIGH (ref 11.4–15.2)

## 2017-11-22 NOTE — Progress Notes (Signed)
PROGRESS NOTE    James David  WVP:[id number] DOB: 1936-06-14 DOA: 11/17/2017 PCP: [name], MD    Brief Narrative:  82 year old with end-stage renal disease on Monday Wednesday Friday dialysis, atrial fibrillation on warfarin, myasthenia gravis on prednisone, prior stroke, secondary heart block status post pacer, type 2 diabetes, hyperlipidemia who was admitted with worsening distal greater than proximal muscle weakness consistent with exacerbation of myasthenia gravis.   Assessment & Plan:   Principal Problem:   Myasthenia gravis in crisis [facility]) Active Problems:   Diabetes mellitus due to underlying condition with diabetic nephropathy ([place])   Chronic diastolic heart failure (HCC)   ESRD (end stage renal disease) on dialysis (HCC)   Atrial fibrillation, permanent ([place])   Dyslipidemia   Dysphagia   Pressure injury of skin   Weakness   Generalized weakness   Myasthenia exacerbation ([place])   #) Myasthenia gravis exacerbation: Continues to be unable to lift his torso.  He is refusing placement in skilled nursing facility.  At this point because of his significant debility his wife cannot physically move him to a wheelchair and wheeled him over dialysis 3 times a week.  Goals of care discussion will be had tomorrow with his wife and the patient's daughters. -Pleated 5 days of IVIG on 11/21/2017 -Neurology following appreciate recommendations -Pending goals of care -Continue prednisone 20 mg daily  #) Paroxysmal atrial fibrillation, gated by secondary heart block status post pacer:  -Continue carvedilol 25 mg twice a day -Continue warfarin per pharmacy dosing hold dose today as patient is supratherapeutic  #) ESRD: -Nephrology following appreciate recommendations -Continue PhosLo 3 times daily with meals -Continue vitamin supplementation  #) Chronic lower extremity wounds: Patient has nonhealing ulcers on bilateral heels, left great toe, right anterior foot, bilateral  malleoli that are nonhealing.  Vascular surgery performed abdominal aortogram on 10/22/2017 that did not show any significant stenosis that would be contributing to these nonhealing ulcers.  They recommended bilateral amputations however he has refused this.  He was discharged home in late February with IV ceftazidime and doxycycline however there was a concern that these exacerbated his underlying myasthenia gravis. -Continue IV cefazolin per ID recommendations, transition to oral cephalexin -Wound care consult -Continue Santyl ointment topically  #) Hyperlipidemia: -Continue Zetia 10 mg -Continue fenofibrate 160 mg   #) Hypertension: -Continue beta-blocker -Continue aspirin 81 mg  Fluids: Restrict Liquids: Monitor and supplement Nutrition: Renal diet  Prophylaxis: On warfarin  Disposition: Pending discussion of goals of care  Full code   Consultants:   Nephrology  Neurology  Procedures:   Dialysis  Antimicrobials: (specify start and planned stop date. Auto populated tables are space occupying and do not give end dates)  IV cefazolin started 11/17/2017   Subjective: Patient reports he is doing well.  He does not have any complaints at this time.  He appears to be quite confused and altered.  He reports that he does not have any pain anywhere.  Objective: Vitals:   11/21/17 1848 11/21/17 2143 11/22/17 0513 11/22/17 0900  BP: (!) 120/52 (!) 103/52 (!) 160/68 (!) 155/58  Pulse: (!) 104 83 99 100  Resp: 18 18 18 18   Temp: 98.9 F (37.2 C) 98.3 F (36.8 C) 98.4 F (36.9 C) 98.2 F (36.8 C)  TempSrc: Oral Oral Oral Oral  SpO2: 100% 100% 100% 100%  Weight:  72 kg (158 lb 11.7 oz)    Height:        Intake/Output Summary (Last 24 hours) at  11/22/2017 1150 Last data filed at 11/22/2017 0948 Gross per 24 hour  Intake 593.2 ml  Output 1050 ml  Net -456.8 ml   Filed Weights   11/21/17 0800 11/21/17 1200 11/21/17 2143  Weight: 73 kg (160 lb 15 oz) 72 kg (158 lb 11.7 oz)  72 kg (158 lb 11.7 oz)    Examination:  General exam: Appears calm and comfortable  Respiratory system: Clear to auscultation. Respiratory effort normal. Cardiovascular system: Regular rate and rhythm, referred murmur from fistula Gastrointestinal system: Abdomen is nondistended, soft and nontender. No organomegaly or masses felt. Normal bowel sounds heard. Central nervous system: A alert, oriented to self and place but not to time or situation..  Moving all extremities Extremities: No lower extremity edema, right upper extremity fistula with palpable thrill and audible bruit Skin: Numerous bilateral ulcers with dressings over lower extremities Psychiatry: Judgement and insight appear impaired.  Mood & affect flat.    Data Reviewed: I have personally reviewed following labs and imaging studies  CBC: Recent Labs  Lab 11/17/17 1345 11/17/17 1353 11/18/17 0123 11/20/17 0611 11/22/17 0338  WBC 14.1*  --  11.1* 11.1* 10.7*  NEUTROABS 13.0*  --   --   --   --   HGB 8.2* 10.5* 7.9* 10.2* 10.8*  HCT 27.9* 31.0* 26.0* 32.4* 34.1*  MCV 100.0  --  99.2 95.9 96.9  PLT 335  --  326 260 270   Basic Metabolic Panel: Recent Labs  Lab 11/17/17 1345 11/17/17 1353 11/18/17 0123 11/20/17 0611 11/21/17 0735 11/22/17 0338  NA 132* 131* 131* 133* 131* 131*  K 4.5 4.4 4.4 3.0* 2.7* 3.2*  CL 91* 91* 92* 93* 93* 93*  CO2 24  --  23 28 26 25   GLUCOSE 276* 261* 183* 101* 94 160*  BUN 83* 83* 90* 20 41* 22*  CREATININE 8.36* 8.50* 8.76* 3.25* 5.29* 3.52*  CALCIUM 9.3  --  9.0 9.0 8.9 8.5*  MG  --   --   --   --   --  1.8  PHOS 2.9  --   --   --   --   --    GFR: Estimated Creatinine Clearance: 17.3 mL/min (A) (by C-G formula based on SCr of 3.52 mg/dL (H)). Liver Function Tests: Recent Labs  Lab 11/17/17 1345  AST 22  ALT 30  ALKPHOS 50  BILITOT 0.8  PROT 5.9*  ALBUMIN 2.2*   No results for input(s): LIPASE, AMYLASE in the last 168 hours. No results for input(s): AMMONIA in the  last 168 hours. Coagulation Profile: Recent Labs  Lab 11/17/17 1530 11/18/17 1542 11/19/17 0534 11/20/17 0611 11/22/17 0338  INR 5.99* 4.41* 3.20 2.15 3.94   Cardiac Enzymes: No results for input(s): CKTOTAL, CKMB, CKMBINDEX, TROPONINI in the last 168 hours. BNP (last 3 results) No results for input(s): PROBNP in the last 8760 hours. HbA1C: No results for input(s): HGBA1C in the last 72 hours. CBG: Recent Labs  Lab 11/20/17 2147 11/21/17 1258 11/21/17 1700 11/21/17 2112 11/22/17 0724  GLUCAP 162* 83 172* 96 128*   Lipid Profile: No results for input(s): CHOL, HDL, LDLCALC, TRIG, CHOLHDL, LDLDIRECT in the last 72 hours. Thyroid Function Tests: No results for input(s): TSH, T4TOTAL, FREET4, T3FREE, THYROIDAB in the last 72 hours. Anemia Panel: No results for input(s): VITAMINB12, FOLATE, FERRITIN, TIBC, IRON, RETICCTPCT in the last 72 hours. Sepsis Labs: Recent Labs  Lab 11/17/17 1353  LATICACIDVEN 1.11    Recent Results (from the past 240  hour(s))  Blood Culture (routine x 2)     Status: None (Preliminary result)   Collection Time: 11/17/17  1:30 PM  Result Value Ref Range Status   Specimen Description BLOOD LEFT ANTECUBITAL  Final   Special Requests IN PEDIATRIC BOTTLE Blood Culture adequate volume  Final   Culture   Final    NO GROWTH 4 DAYS Performed at [facility], [place] [address] [place], [place] [address]    Report Status PENDING  Incomplete  Blood Culture (routine x 2)     Status: None (Preliminary result)   Collection Time: 11/17/17  1:45 PM  Result Value Ref Range Status   Specimen Description BLOOD LEFT HAND  Final   Special Requests IN PEDIATRIC BOTTLE Blood Culture adequate volume  Final   Culture   Final    NO GROWTH 4 DAYS Performed at [facility], [place] [address] [place], [place] [address]    Report Status PENDING  Incomplete         Radiology Studies: No results found.      Scheduled Meds: . benzonatate  100 mg  Oral TID  . collagenase   Topical Daily  . ezetimibe  10 mg Oral Daily  . feeding supplement (ENSURE ENLIVE)  237 mL Oral BID BM  . fenofibrate  160 mg Oral Daily  . finasteride  5 mg Oral Daily  . insulin aspart  0-5 Units Subcutaneous QHS  . insulin aspart  0-9 Units Subcutaneous TID WC  . insulin aspart  3 Units Subcutaneous TID WC  . multivitamin  1 tablet Oral QHS  . predniSONE  20 mg Oral Q breakfast  . Warfarin - Pharmacist Dosing Inpatient   Does not apply q1800   Continuous Infusions: .  ceFAZolin (ANCEF) IV Stopped (11/21/17 2154)     LOS: 5 days    Time spent: [place], MD [facility]  If 7PM-7AM, please contact night-coverage [email or web address] Password [email or web address] 11/22/2017, 11:50 AM

## 2017-11-22 NOTE — Progress Notes (Signed)
[facility] Progress Note    Subjective:  States he is doing well.  Still seems to be mildly confused.  Wife is at bedside.    Objective Vitals:   11/21/17 1848 11/21/17 2143 11/22/17 0513 11/22/17 0900  BP: (!) 120/52 (!) 103/52 (!) 160/68 (!) 155/58  Pulse: (!) 104 83 99 100  Resp: 18 18 18 18   Temp: 98.9 F (37.2 C) 98.3 F (36.8 C) 98.4 F (36.9 C) 98.2 F (36.8 C)  TempSrc: Oral Oral Oral Oral  SpO2: 100% 100% 100% 100%  Weight:  72 kg (158 lb 11.7 oz)    Height:       Physical Exam General:NAD, chronically ill appearing elderly male laying in bed Heart:RRR, no mrg Lungs:CTAB Abdomen:soft, NTND, +BS Extremities:no pedal edema, +b/l foot ulcers Dialysis Access: RU AVF, +b/t - bruising surrounding   Filed Weights   11/21/17 0800 11/21/17 1200 11/21/17 2143  Weight: 73 kg (160 lb 15 oz) 72 kg (158 lb 11.7 oz) 72 kg (158 lb 11.7 oz)    Intake/Output Summary (Last 24 hours) at 11/22/2017 1044 Last data filed at 11/22/2017 0948 Gross per 24 hour  Intake 593.2 ml  Output 1050 ml  Net -456.8 ml    Additional Objective Labs: Basic Metabolic Panel: Recent Labs  Lab 11/17/17 1345  11/20/17 0611 11/21/17 0735 11/22/17 0338  NA 132*   < > 133* 131* 131*  K 4.5   < > 3.0* 2.7* 3.2*  CL 91*   < > 93* 93* 93*  CO2 24   < > 28 26 25   GLUCOSE 276*   < > 101* 94 160*  BUN 83*   < > 20 41* 22*  CREATININE 8.36*   < > 3.25* 5.29* 3.52*  CALCIUM 9.3   < > 9.0 8.9 8.5*  PHOS 2.9  --   --   --   --    < > = values in this interval not displayed.   Liver Function Tests: Recent Labs  Lab 11/17/17 1345  AST 22  ALT 30  ALKPHOS 50  BILITOT 0.8  PROT 5.9*  ALBUMIN 2.2*   No results for input(s): LIPASE, AMYLASE in the last 168 hours. CBC: Recent Labs  Lab 11/17/17 1345  11/18/17 0123 11/20/17 0611 11/22/17 0338  WBC 14.1*  --  11.1* 11.1* 10.7*  NEUTROABS 13.0*  --   --   --   --   HGB 8.2*   < > 7.9* 10.2* 10.8*  HCT 27.9*   < > 26.0* 32.4*  34.1*  MCV 100.0  --  99.2 95.9 96.9  PLT 335  --  326 260 209   < > = values in this interval not displayed.   CBG: Recent Labs  Lab 11/20/17 2147 11/21/17 1258 11/21/17 1700 11/21/17 2112 11/22/17 0724  GLUCAP 162* 83 172* 96 128*    Lab Results  Component Value Date   INR 3.94 11/22/2017   INR 2.15 11/20/2017   INR 3.20 11/19/2017   Studies/Results: No results found.  Medications: .  ceFAZolin (ANCEF) IV Stopped (11/21/17 2154)   . benzonatate  100 mg Oral TID  . collagenase   Topical Daily  . ezetimibe  10 mg Oral Daily  . feeding supplement (ENSURE ENLIVE)  237 mL Oral BID BM  . fenofibrate  160 mg Oral Daily  . finasteride  5 mg Oral Daily  . insulin aspart  0-5 Units Subcutaneous QHS  . insulin aspart  0-9  Units Subcutaneous TID WC  . insulin aspart  3 Units Subcutaneous TID WC  . multivitamin  1 tablet Oral QHS  . predniSONE  20 mg Oral Q breakfast  . Warfarin - Pharmacist Dosing Inpatient   Does not apply q1800    Dialysis Orders: [facility] MWF 4h 2/2.25  73.5kg  P2 Hep none RUA AVF   -Mircera 200 q2wk ( last given 11/14/17)   Assessment/Plan: 1. Myasthenia gravis - progressive debility/gen weakness. On prednisone. Finished course of IVIG yesterday.  Neuro debating on possible OP IVIG.  2. B/l foot ulcers/R lat malleolus osteo - declined b/l amputation suggested by [facility] last admit -On cefazolin 1gm qd while inpatient per ID 3. ESRD - HD yesterday, tolerated well. Continue per regular schedule. K 3.2 4. Anemia of CKD- Hgb 10.8. aranesp  5. Secondary hyperparathyroidism - Cor Ca 9.9, P 2.9 on 3/4, holding binders, recheck prior to HD on Monday.  6. HTN/volume -  BP mildly elevated.  Does not appear volume overloaded. Under EDW. Titrate down as tolerated. WIll need new EDW at d/c. 7. Nutrition - last Alb 2.2 - renavite, nepro TID 8. A fib - on carvedilol, coumadin  Jen Mow, PA-C [facility] Pager:  [phone] 11/22/2017,10:44 AM  LOS: 5 days

## 2017-11-22 NOTE — Progress Notes (Signed)
RT note: NIF of -26, VC of 1.9L performed with good effort.

## 2017-11-22 NOTE — Progress Notes (Signed)
NIF= -40 with three attempts with great effort. VC= 1.3 L

## 2017-11-22 NOTE — Progress Notes (Signed)
[facility] Progress Note    Subjective:  States he is doing well.  Still seems to be mildly confused.  Wife is at bedside.    Objective Vitals:   11/21/17 1848 11/21/17 2143 11/22/17 0513 11/22/17 0900  BP: (!) 120/52 (!) 103/52 (!) 160/68 (!) 155/58  Pulse: (!) 104 83 99 100  Resp: 18 18 18 18   Temp: 98.9 F (37.2 C) 98.3 F (36.8 C) 98.4 F (36.9 C) 98.2 F (36.8 C)  TempSrc: Oral Oral Oral Oral  SpO2: 100% 100% 100% 100%  Weight:  72 kg (158 lb 11.7 oz)    Height:       Physical Exam General:NAD, chronically ill appearing elderly male laying in bed Heart:RRR, no mrg Lungs:CTAB Abdomen:soft, NTND, +BS Extremities:no pedal edema, +b/l foot ulcers Dialysis Access: RU AVF, +b/t - bruising surrounding   Filed Weights   11/21/17 0800 11/21/17 1200 11/21/17 2143  Weight: 73 kg (160 lb 15 oz) 72 kg (158 lb 11.7 oz) 72 kg (158 lb 11.7 oz)    Intake/Output Summary (Last 24 hours) at 11/22/2017 1257 Last data filed at 11/22/2017 0948 Gross per 24 hour  Intake 593.2 ml  Output 50 ml  Net 543.2 ml    Additional Objective Labs: Basic Metabolic Panel: Recent Labs  Lab 11/17/17 1345  11/20/17 0611 11/21/17 0735 11/22/17 0338  NA 132*   < > 133* 131* 131*  K 4.5   < > 3.0* 2.7* 3.2*  CL 91*   < > 93* 93* 93*  CO2 24   < > 28 26 25   GLUCOSE 276*   < > 101* 94 160*  BUN 83*   < > 20 41* 22*  CREATININE 8.36*   < > 3.25* 5.29* 3.52*  CALCIUM 9.3   < > 9.0 8.9 8.5*  PHOS 2.9  --   --   --   --    < > = values in this interval not displayed.   Liver Function Tests: Recent Labs  Lab 11/17/17 1345  AST 22  ALT 30  ALKPHOS 50  BILITOT 0.8  PROT 5.9*  ALBUMIN 2.2*   No results for input(s): LIPASE, AMYLASE in the last 168 hours. CBC: Recent Labs  Lab 11/17/17 1345  11/18/17 0123 11/20/17 0611 11/22/17 0338  WBC 14.1*  --  11.1* 11.1* 10.7*  NEUTROABS 13.0*  --   --   --   --   HGB 8.2*   < > 7.9* 10.2* 10.8*  HCT 27.9*   < > 26.0* 32.4* 34.1*   MCV 100.0  --  99.2 95.9 96.9  PLT 335  --  326 260 209   < > = values in this interval not displayed.   CBG: Recent Labs  Lab 11/21/17 1258 11/21/17 1700 11/21/17 2112 11/22/17 0724 11/22/17 1148  GLUCAP 83 172* 96 128* 157*    Lab Results  Component Value Date   INR 3.94 11/22/2017   INR 2.15 11/20/2017   INR 3.20 11/19/2017   Studies/Results: No results found.  Medications: .  ceFAZolin (ANCEF) IV Stopped (11/21/17 2154)   . benzonatate  100 mg Oral TID  . collagenase   Topical Daily  . ezetimibe  10 mg Oral Daily  . feeding supplement (ENSURE ENLIVE)  237 mL Oral BID BM  . fenofibrate  160 mg Oral Daily  . finasteride  5 mg Oral Daily  . insulin aspart  0-5 Units Subcutaneous QHS  . insulin aspart  0-9  Units Subcutaneous TID WC  . insulin aspart  3 Units Subcutaneous TID WC  . multivitamin  1 tablet Oral QHS  . predniSONE  20 mg Oral Q breakfast  . Warfarin - Pharmacist Dosing Inpatient   Does not apply q1800    Dialysis Orders: [facility] MWF 4h 2/2.25  73.5kg  P2 Hep none RUA AVF   -Mircera 200 q2wk ( last given 11/14/17)   Assessment/Plan: 1. Myasthenia gravis - progressive debility/gen weakness. On prednisone. Finished course of IVIG yesterday.  Neuro debating on possible OP IVIG.  Not doing well at all.  Per primary plan is for [place] meeting with daughter who is coming in town tomorrow from [place].  2. B/l foot ulcers/R lat malleolus osteo - declined b/l amputation suggested by VVS last admit -On cefazolin 1gm qd while inpatient per ID 3. ESRD - HD yesterday, tolerated well. Continue per regular schedule. K 3.2 4. Anemia of CKD- Hgb 10.8. aranesp  5. Secondary hyperparathyroidism - Cor Ca 9.9, P 2.9 on 3/4, holding binders, recheck prior to HD on Monday.  6. HTN/volume -  BP mildly elevated.  Does not appear volume overloaded. Under EDW. Titrate down as tolerated. 7. Nutrition - last Alb 2.2 - renavite, nepro TID 8. A fib - on carvedilol,  coumadin  Jen Mow, PA-C [facility] Pager: [phone] 11/22/2017,12:57 PM  LOS: 5 days   Pt seen, examined, agree w assess/plan as above with additions as indicated.  Kelly Splinter MD [employer] pager [phone]    cell [phone] 11/22/2017, 12:58 PM

## 2017-11-22 NOTE — Progress Notes (Signed)
ANTICOAGULATION CONSULT NOTE - Initial Consult  Pharmacy Consult for Coumadin Indication: atrial fibrillation  Allergies  Allergen Reactions  . Amlodipine Besylate Swelling    Angioedema   . Lisinopril Other (See Comments)    Renal insufficiency  . Statins Other (See Comments)    Muscle aches  . Actos [Pioglitazone]     Chest pain  . Ropinirole Other (See Comments)    Causes excessive grogginess the following morning after taking  . Tape Other (See Comments)    SKIN IS THIN AND TAPE WILL ACTUALLY TEAR IT; PLEASE USE COBAN WRAP INSTEAD!!   Patient Measurements: Height: 6' (182.9 cm) Weight: 158 lb 11.7 oz (72 kg) IBW/kg (Calculated) : 77.6  Assessment: 82 yo M on Coumadin 1mg  daily PTA for Afib per family. INR on admit elevated at 5.99. Received vitamin K PO. CHADsVASC at least 5. Warfarin restarted when INR down to 2.15, unable to get INR 3/8. Hgb 10.8 stable, plts wnl. No bleeding noted.  INR supratherapeutic (3.94) after 1 mg x 3 days.  Goal of Therapy:  INR 2-3 Monitor platelets by anticoagulation protocol: Yes   Plan:  Hold Coumadin tonight. Monitor daily INR, CBC, s/s of bleed   Chritopher Coster A Abdulkadir Emmanuel 11/22/2017,8:17 AM

## 2017-11-23 LAB — GLUCOSE, CAPILLARY
GLUCOSE-CAPILLARY: 198 mg/dL — AB (ref 65–99)
GLUCOSE-CAPILLARY: 150 mg/dL — AB (ref 65–99)
GLUCOSE-CAPILLARY: 49 mg/dL — AB (ref 65–99)
GLUCOSE-CAPILLARY: 58 mg/dL — AB (ref 65–99)
Glucose-Capillary: 124 mg/dL — ABNORMAL HIGH (ref 65–99)
Glucose-Capillary: 167 mg/dL — ABNORMAL HIGH (ref 65–99)

## 2017-11-23 LAB — RENAL FUNCTION PANEL
Albumin: 2.2 g/dL — ABNORMAL LOW (ref 3.5–5.0)
Anion gap: 14 (ref 5–15)
BUN: 41 mg/dL — ABNORMAL HIGH (ref 6–20)
CO2: 23 mmol/L (ref 22–32)
Calcium: 8.9 mg/dL (ref 8.9–10.3)
Chloride: 93 mmol/L — ABNORMAL LOW (ref 101–111)
Creatinine, Ser: 5.91 mg/dL — ABNORMAL HIGH (ref 0.61–1.24)
GFR calc Af Amer: 9 mL/min — ABNORMAL LOW (ref >60)
GFR calc non Af Amer: 8 mL/min — ABNORMAL LOW (ref >60)
Glucose, Bld: 137 mg/dL — ABNORMAL HIGH (ref 65–99)
Phosphorus: 2.6 mg/dL (ref 2.5–4.6)
Potassium: 3.4 mmol/L — ABNORMAL LOW (ref 3.5–5.1)
Sodium: 130 mmol/L — ABNORMAL LOW (ref 135–145)

## 2017-11-23 LAB — PROTIME-INR
INR: 4.09
Prothrombin Time: 39.4 s — ABNORMAL HIGH (ref 11.4–15.2)

## 2017-11-23 MED ORDER — POTASSIUM CHLORIDE CRYS ER 20 MEQ PO TBCR
40.0000 meq | EXTENDED_RELEASE_TABLET | Freq: Once | ORAL | Status: AC
  Administered 2017-11-23: 40 meq via ORAL
  Filled 2017-11-23: qty 2

## 2017-11-23 MED ORDER — CEPHALEXIN 500 MG PO CAPS: 500.0000 mg | ORAL_CAPSULE | Freq: Three times a day (TID) | ORAL | Status: DC

## 2017-11-23 MED ORDER — DEXTROSE 50 % IV SOLN
INTRAVENOUS | Status: AC
  Filled 2017-11-23: qty 50

## 2017-11-23 MED ORDER — GLUCOSE 40 % PO GEL
ORAL | Status: AC
  Administered 2017-11-23: 37.5 g
  Filled 2017-11-23: qty 1

## 2017-11-23 MED ORDER — CEPHALEXIN 500 MG PO CAPS
500.0000 mg | ORAL_CAPSULE | Freq: Every evening | ORAL | Status: DC
  Administered 2017-11-23: 500 mg via ORAL
  Filled 2017-11-23: qty 1

## 2017-11-23 NOTE — Significant Event (Signed)
Extensive discussion had with Dr. Jonnie Finner from nephrology, Vira Browns nephrology PA in this writer with 2 daughters, Joslyn Hy, wife about patient's extremely poor functional status and his goals of care.  This Probation officer also discussed with neurology Dr. Leonel Ramsay over the phone that his myasthenia gravis symptoms had resolved with IVIG for 5 days.  That whatever residual lack of function was secondary to his multiple chronic medical illnesses.  The family have elected to transition the patient to hospice care due to his extremely poor prognosis.  Dr. Jonnie Finner informed the patient that he would no longer be getting dialysis and he understood this.  The family would prefer to have inpatient hospice as is expected to die within 1-2 weeks when being off dialysis.  Will discuss with case management about d/c to hospice.

## 2017-11-23 NOTE — Progress Notes (Signed)
VC 1.4L  NIF -40  Good effort

## 2017-11-23 NOTE — Clinical Social Work Note (Addendum)
CSW received a call from Pt's spouse. She was very tearful. She has decided to take the pt home with hospice. RNCM notified. Clinical Social Worker will sign off for now as social work intervention is no longer needed. Please consult [place] again if new need arises.   Shelton Silvas A Kaio Kuhlman 11/23/2017

## 2017-11-23 NOTE — Progress Notes (Signed)
Pt's bed alarm went off. This nurse went in and found patient with his legs on the floor and his upper body on the bed gripping the side rail. This nurse assisted patient to the floor and assessed for injuries. No injuries were noted. Pt could not tell this nurse what happened d/t periods of confusion. Pt was lifted via hoyer lift with the help of several nurses back into the bed. Pt denies pain. MD made aware and wife, [name], who was very supportive. Pt is now resting comfortably in the bed, will continue to monitor.   Eleanora Neighbor, RN

## 2017-11-23 NOTE — Progress Notes (Signed)
Patients spouse Mrs. Viscomi, is requesting to change the location of Hospice. She wants to take the patient home and have [place]. MD paged.

## 2017-11-23 NOTE — Progress Notes (Signed)
Received call from [place], [place], stating that pt's wife wants to take pt home with hospice. Attempted to meet with pt's wife, but RN with [place] in the room discussing [facility] hospice. Referral made to [facility] by [place].

## 2017-11-23 NOTE — Progress Notes (Signed)
PROGRESS NOTE    James David  XBM:[id number] DOB: June 07, 1936 DOA: 11/17/2017 PCP: [name], MD    Brief Narrative:  82 year old with end-stage renal disease on Monday Wednesday Friday dialysis, atrial fibrillation on warfarin, myasthenia gravis on prednisone, prior stroke, secondary heart block status post pacer, type 2 diabetes, hyperlipidemia who was admitted with worsening distal greater than proximal muscle weakness consistent with exacerbation of myasthenia gravis.   Assessment & Plan:   Principal Problem:   Myasthenia gravis in crisis [facility]) Active Problems:   Diabetes mellitus due to underlying condition with diabetic nephropathy ([place])   Chronic diastolic heart failure (HCC)   ESRD (end stage renal disease) on dialysis (HCC)   Atrial fibrillation, permanent ([place])   Dyslipidemia   Dysphagia   Pressure injury of skin   Weakness   Generalized weakness   Myasthenia exacerbation ([place])   #) Myasthenia gravis exacerbation: Continues to be unable to lift his torso.  He is refusing placement in skilled nursing facility.  At this point because of his significant debility his wife cannot physically move him to a wheelchair and wheeled him over dialysis 3 times a week.  He is also adamantly opposed to going to skilled nursing facility.  His wife agrees with this.  Per discussion with neurology it is not clear that the patient's prominent weakness has any relationship to his myasthenia gravis.  It appears that his myasthenia symptoms have now completely resolved with treatment. -Completed 5 days of IVIG on 11/21/2017 -Neurology following appreciate recommendations -Pending goals of care with daughter and wife -Continue prednisone 20 mg daily  #) Paroxysmal atrial fibrillation, complicated by secondary heart block status post pacer:  -Continue carvedilol 25 mg twice a day -Continue warfarin per pharmacy dosing continue to hold dose today as patient is supratherapeutic  #)  ESRD: -Nephrology following appreciate recommendations -Continue PhosLo 3 times daily with meals -Continue vitamin supplementation  #) Chronic lower extremity wounds: Patient has nonhealing ulcers on bilateral heels, left great toe, right anterior foot, bilateral malleoli that are nonhealing.  Vascular surgery performed abdominal aortogram on 10/22/2017 that did not show any significant stenosis that would be contributing to these nonhealing ulcers.  They recommended bilateral amputations however he has refused this.  He was discharged home in late February with IV ceftazidime and doxycycline however there was a concern that these exacerbated his underlying myasthenia gravis. -Continue IV cefazolin per ID recommendations, transition to oral cephalexin on discharge -Wound care consult -Continue Santyl ointment topically  #) Hyperlipidemia: -Continue Zetia 10 mg -Continue fenofibrate 160 mg   #) Hypertension: -Continue beta-blocker -Continue aspirin 81 mg  Fluids: Restrict Liquids: Monitor and supplement Nutrition: Renal diet  Prophylaxis: On warfarin  Disposition: Pending discussion of goals of care  Full code   Consultants:   Nephrology  Neurology  Procedures:   Dialysis  Antimicrobials: (specify start and planned stop date. Auto populated tables are space occupying and do not give end dates)  IV cefazolin started 11/17/2017   Subjective: Patient does not have any complaints today.  He is lying in bed does not have any pain.  His wife reports that he appears to be doing well.  Objective: Vitals:   11/22/17 1709 11/22/17 2015 11/23/17 0418 11/23/17 0941  BP: (!) 142/74 (!) 149/82 (!) 136/51 (!) 172/85  Pulse: (!) 118 (!) 104 (!) 102 (!) 105  Resp: 18 16 15 16   Temp: 98 F (36.7 C) 98.7 F (37.1 C) 98.6 F (37 C) 98.6 F (  37 C)  TempSrc: Oral   Oral  SpO2: 100% 100% 98% 98%  Weight:  72 kg (158 lb 11.7 oz)    Height:        Intake/Output Summary (Last 24  hours) at 11/23/2017 1018 Last data filed at 11/23/2017 1014 Gross per 24 hour  Intake 844 ml  Output 125 ml  Net 719 ml   Filed Weights   11/21/17 1200 11/21/17 2143 11/22/17 2015  Weight: 72 kg (158 lb 11.7 oz) 72 kg (158 lb 11.7 oz) 72 kg (158 lb 11.7 oz)    Examination:  General exam: Appears calm and comfortable  Respiratory system: Clear to auscultation. Respiratory effort normal. Cardiovascular system: Regular rate and rhythm, referred murmur from fistula Gastrointestinal system: Abdomen is nondistended, soft and nontender. No organomegaly or masses felt. Normal bowel sounds heard. Central nervous system: A alert, oriented to self and place but not to time or situation, strength in upper extremity and lower extremity appears to be 4+ throughout.  Continues to have significant amounts of central weakness with being unable to lift his torso off the bed.  S Extremities: No lower extremity edema, right upper extremity fistula with palpable thrill and audible bruit Skin: Numerous bilateral ulcers with dressings over lower extremities Psychiatry: Judgement and insight appear impaired.  Mood & affect flat.    Data Reviewed: I have personally reviewed following labs and imaging studies  CBC: Recent Labs  Lab 11/17/17 1345 11/17/17 1353 11/18/17 0123 11/20/17 0611 11/22/17 0338  WBC 14.1*  --  11.1* 11.1* 10.7*  NEUTROABS 13.0*  --   --   --   --   HGB 8.2* 10.5* 7.9* 10.2* 10.8*  HCT 27.9* 31.0* 26.0* 32.4* 34.1*  MCV 100.0  --  99.2 95.9 96.9  PLT 335  --  326 260 628   Basic Metabolic Panel: Recent Labs  Lab 11/17/17 1345  11/18/17 0123 11/20/17 0611 11/21/17 0735 11/22/17 0338 11/23/17 0710  NA 132*   < > 131* 133* 131* 131* 130*  K 4.5   < > 4.4 3.0* 2.7* 3.2* 3.4*  CL 91*   < > 92* 93* 93* 93* 93*  CO2 24  --  23 28 26 25 23   GLUCOSE 276*   < > 183* 101* 94 160* 137*  BUN 83*   < > 90* 20 41* 22* 41*  CREATININE 8.36*   < > 8.76* 3.25* 5.29* 3.52* 5.91*    CALCIUM 9.3  --  9.0 9.0 8.9 8.5* 8.9  MG  --   --   --   --   --  1.8  --   PHOS 2.9  --   --   --   --   --  2.6   < > = values in this interval not displayed.   GFR: Estimated Creatinine Clearance: 10.3 mL/min (A) (by C-G formula based on SCr of 5.91 mg/dL (H)). Liver Function Tests: Recent Labs  Lab 11/17/17 1345 11/23/17 0710  AST 22  --   ALT 30  --   ALKPHOS 50  --   BILITOT 0.8  --   PROT 5.9*  --   ALBUMIN 2.2* 2.2*   No results for input(s): LIPASE, AMYLASE in the last 168 hours. No results for input(s): AMMONIA in the last 168 hours. Coagulation Profile: Recent Labs  Lab 11/18/17 1542 11/19/17 0534 11/20/17 0611 11/22/17 0338 11/23/17 0710  INR 4.41* 3.20 2.15 3.94 4.09*   Cardiac Enzymes: No results for  input(s): CKTOTAL, CKMB, CKMBINDEX, TROPONINI in the last 168 hours. BNP (last 3 results) No results for input(s): PROBNP in the last 8760 hours. HbA1C: No results for input(s): HGBA1C in the last 72 hours. CBG: Recent Labs  Lab 11/22/17 0724 11/22/17 1148 11/22/17 1631 11/22/17 2131 11/23/17 0730  GLUCAP 128* 157* 248* 126* 124*   Lipid Profile: No results for input(s): CHOL, HDL, LDLCALC, TRIG, CHOLHDL, LDLDIRECT in the last 72 hours. Thyroid Function Tests: No results for input(s): TSH, T4TOTAL, FREET4, T3FREE, THYROIDAB in the last 72 hours. Anemia Panel: No results for input(s): VITAMINB12, FOLATE, FERRITIN, TIBC, IRON, RETICCTPCT in the last 72 hours. Sepsis Labs: Recent Labs  Lab 11/17/17 1353  LATICACIDVEN 1.11    Recent Results (from the past 240 hour(s))  Blood Culture (routine x 2)     Status: None   Collection Time: 11/17/17  1:30 PM  Result Value Ref Range Status   Specimen Description BLOOD LEFT ANTECUBITAL  Final   Special Requests IN PEDIATRIC BOTTLE Blood Culture adequate volume  Final   Culture   Final    NO GROWTH 5 DAYS Performed at [facility], [place] [address] [place], [place] [address]    Report Status  11/22/2017 FINAL  Final  Blood Culture (routine x 2)     Status: None   Collection Time: 11/17/17  1:45 PM  Result Value Ref Range Status   Specimen Description BLOOD LEFT HAND  Final   Special Requests IN PEDIATRIC BOTTLE Blood Culture adequate volume  Final   Culture   Final    NO GROWTH 5 DAYS Performed at [facility], [place] [address] [place], [place] [address]    Report Status 11/22/2017 FINAL  Final         Radiology Studies: No results found.      Scheduled Meds: . benzonatate  100 mg Oral TID  . collagenase   Topical Daily  . ezetimibe  10 mg Oral Daily  . feeding supplement (ENSURE ENLIVE)  237 mL Oral BID BM  . fenofibrate  160 mg Oral Daily  . finasteride  5 mg Oral Daily  . insulin aspart  0-5 Units Subcutaneous QHS  . insulin aspart  0-9 Units Subcutaneous TID WC  . insulin aspart  3 Units Subcutaneous TID WC  . multivitamin  1 tablet Oral QHS  . predniSONE  20 mg Oral Q breakfast  . Warfarin - Pharmacist Dosing Inpatient   Does not apply q1800   Continuous Infusions: .  ceFAZolin (ANCEF) IV Stopped (11/22/17 1743)     LOS: 6 days    Time spent: [place], MD [facility]  If 7PM-7AM, please contact night-coverage [email or web address] Password [email or web address] 11/23/2017, 10:18 AM

## 2017-11-23 NOTE — Progress Notes (Signed)
Dr. Herbert Moors reports that pt's family wants pt to go to a hospice facility. Contacted [place], SW for referral.

## 2017-11-23 NOTE — Progress Notes (Signed)
Pt requesting that all four bed rails be up so patient will not fall out of bed. Bed rails are up per patient request.   Eleanora Neighbor, RN

## 2017-11-23 NOTE — Progress Notes (Signed)
[facility]:   Met with wife and pts daughter Jeannene Patella. Discussed hospice services and how we would help at home. She has equipment in home of hospital bed, BSC, shower bench, wheelchair, hoyer lift. We will have oxygen delivered just for in the future if pt needs it for comfort. It does not have to be there before the pt can be discharged. He will need to go by ambulance per wife.  We have accepted pt and will follow when discharged with nursing care, Social work, Doctor, hospital, Retail buyer services at home. Webb Silversmith RN  [phone]

## 2017-11-23 NOTE — Progress Notes (Signed)
VC 900 mL  NIF -30  with good effort

## 2017-11-23 NOTE — Progress Notes (Addendum)
[facility] Progress Note   Subjective:   C/os "my butt hurts". Joking during exam.  Appears to have lucid moments.  When discussing putting in his HD orders for tomorrow, he stated "I don't want to do dialysis anymore", I asked if he know what that meant and he said "yes, I will die".   Objective Vitals:   11/22/17 1709 11/22/17 2015 11/23/17 0418 11/23/17 0941  BP: (!) 142/74 (!) 149/82 (!) 136/51 (!) 172/85  Pulse: (!) 118 (!) 104 (!) 102 (!) 105  Resp: 18 16 15 16   Temp: 98 F (36.7 C) 98.7 F (37.1 C) 98.6 F (37 C) 98.6 F (37 C)  TempSrc: Oral   Oral  SpO2: 100% 100% 98% 98%  Weight:  72 kg (158 lb 11.7 oz)    Height:       Physical Exam General:NAD, chronically ill appearing male Heart:RRR, 2/6 systolic murmur Lungs:CTAB Abdomen: soft, NTND, +BS Extremities:no edema, +b/l leg ulcers Dialysis Access: RU AVF, +b/t  Neuro: Oriented to self, place, president. Not oriented to time/year.    Filed Weights   11/21/17 1200 11/21/17 2143 11/22/17 2015  Weight: 72 kg (158 lb 11.7 oz) 72 kg (158 lb 11.7 oz) 72 kg (158 lb 11.7 oz)    Intake/Output Summary (Last 24 hours) at 11/23/2017 1315 Last data filed at 11/23/2017 1014 Gross per 24 hour  Intake 844 ml  Output 125 ml  Net 719 ml    Additional Objective Labs: Basic Metabolic Panel: Recent Labs  Lab 11/17/17 1345  11/21/17 0735 11/22/17 0338 11/23/17 0710  NA 132*   < > 131* 131* 130*  K 4.5   < > 2.7* 3.2* 3.4*  CL 91*   < > 93* 93* 93*  CO2 24   < > 26 25 23   GLUCOSE 276*   < > 94 160* 137*  BUN 83*   < > 41* 22* 41*  CREATININE 8.36*   < > 5.29* 3.52* 5.91*  CALCIUM 9.3   < > 8.9 8.5* 8.9  PHOS 2.9  --   --   --  2.6   < > = values in this interval not displayed.   Liver Function Tests: Recent Labs  Lab 11/17/17 1345 11/23/17 0710  AST 22  --   ALT 30  --   ALKPHOS 50  --   BILITOT 0.8  --   PROT 5.9*  --   ALBUMIN 2.2* 2.2*   CBC: Recent Labs  Lab 11/17/17 1345   11/18/17 0123 11/20/17 0611 11/22/17 0338  WBC 14.1*  --  11.1* 11.1* 10.7*  NEUTROABS 13.0*  --   --   --   --   HGB 8.2*   < > 7.9* 10.2* 10.8*  HCT 27.9*   < > 26.0* 32.4* 34.1*  MCV 100.0  --  99.2 95.9 96.9  PLT 335  --  326 260 209   < > = values in this interval not displayed.   CBG: Recent Labs  Lab 11/22/17 1148 11/22/17 1631 11/22/17 2131 11/23/17 0730 11/23/17 1144  GLUCAP 157* 248* 126* 124* 198*    Lab Results  Component Value Date   INR 4.09 (HH) 11/23/2017   INR 3.94 11/22/2017   INR 2.15 11/20/2017   Studies/Results: No results found.  Medications: .  ceFAZolin (ANCEF) IV Stopped (11/22/17 1743)   . benzonatate  100 mg Oral TID  . collagenase   Topical Daily  . ezetimibe  10 mg Oral  Daily  . feeding supplement (ENSURE ENLIVE)  237 mL Oral BID BM  . fenofibrate  160 mg Oral Daily  . finasteride  5 mg Oral Daily  . insulin aspart  0-5 Units Subcutaneous QHS  . insulin aspart  0-9 Units Subcutaneous TID WC  . insulin aspart  3 Units Subcutaneous TID WC  . multivitamin  1 tablet Oral QHS  . predniSONE  20 mg Oral Q breakfast  . Warfarin - Pharmacist Dosing Inpatient   Does not apply q1800    Dialysis Orders: [facility] MWF 4h 2/2.25 73.5kgP2 Hep none RUA AVF  -Mircera 200 q2wk ( last given 11/14/17)   Assessment/Plan: 1. Myasthenia gravis - progressive debility/gen weakness. On prednisone. Finished course of IVIG yesterday. Continues to decline.  2. B/l foot ulcers/R lat malleolus osteo - declined b/l amputation suggested by VVS last admit -On cefazolin 1gm qd while inpatient per ID 3. ESRD - Stopping dialysis, transition to hospice. 4. Anemia of CKD- Hgb 10.8 5. Secondary hyperparathyroidism - Cor Ca 9.9, P 2.9 on 3/4 6. HTN/volume -  BP variable. 7. Nutrition - last Alb 2.2 - Liberalize diet. 8. A fib - on carvedilol, coumadin 9. DISPO: With patient further decline and poor prognosis, previously scheduled family meeting to  discuss [place] today.  Family has decided to stop dialysis and transition to hospice. Decision discussed with patient.  Focus on comfort care. Hospice to be arranged.     Jen Mow, PA-C [facility] Pager: [phone] 11/23/2017,1:15 PM  LOS: 6 days   Pt seen, examined and agree w A/P as above.  Kelly Splinter MD [employer] pager [phone]   11/23/2017, 2:30 PM

## 2017-11-23 NOTE — Clinical Social Work Note (Addendum)
CSW spoke with RNCM, pt is now residential hospice--CSW spoke with pt's spouse and pt's spouse is agreeable to residential hospital. Pt's spouse is agreeable to either [facility]osp[facility] in [facility] or [place]. Pt's spouse is agreeable to whichever facility has a bed. CSW to reach out to determine bed availability and will follow up with pt's spouse.  2:40: [facility] has a bed and will be contacting pt's spouse.  [place], [place]

## 2017-11-23 NOTE — Progress Notes (Signed)
Hypoglycemic Event  CBG: 49  Treatment: 15 GM carbohydrate snack  Symptoms: None  Follow-up CBG: Time:2225 CBG Result:56  Possible Reasons for Event: Inadequate meal intake  Comments/MD notified:MD made aware, had to give glucose gel after CBG 56 and it went up to 160s. After 30 minutes. Will continue to monitor.     James David

## 2017-11-23 NOTE — Progress Notes (Signed)
PHARMACY NOTE:  ANTIMICROBIAL RENAL DOSAGE ADJUSTMENT  Current antimicrobial regimen includes a mismatch between antimicrobial dosage and estimated renal function.  As per policy approved by the Pharmacy & Therapeutics and Medical Executive Committees, the antimicrobial dosage will be adjusted accordingly.  Current antimicrobial dosage:  Amoxicillin 500 mg PO q8h  Indication: chronic LE wounds  Renal Function: ESRD and pursuing hospice care  Estimated Creatinine Clearance: 10.3 mL/min (A) (by C-G formula based on SCr of 5.91 mg/dL (H)). []      On intermittent HD, scheduled: []      On CRRT    Antimicrobial dosage has been changed to:  Amoxicillin 500 PO every evening  Additional comments:   Thank you for allowing pharmacy to be a part of this patient's care.  Renold Genta, PharmD, BCPS Clinical Pharmacist Main Rx at [phone] 11/23/2017 2:50 PM

## 2017-11-23 NOTE — Progress Notes (Signed)
ANTICOAGULATION CONSULT NOTE - Initial Consult  Pharmacy Consult for Coumadin Indication: atrial fibrillation  Allergies  Allergen Reactions  . Amlodipine Besylate Swelling    Angioedema   . Lisinopril Other (See Comments)    Renal insufficiency  . Statins Other (See Comments)    Muscle aches  . Actos [Pioglitazone]     Chest pain  . Ropinirole Other (See Comments)    Causes excessive grogginess the following morning after taking  . Tape Other (See Comments)    SKIN IS THIN AND TAPE WILL ACTUALLY TEAR IT; PLEASE USE COBAN WRAP INSTEAD!!   Patient Measurements: Height: 6' (182.9 cm) Weight: 158 lb 11.7 oz (72 kg) IBW/kg (Calculated) : 77.6  Assessment: 82 yo M on Coumadin 1mg  daily PTA for Afib per family. INR on admit elevated at 5.99. Received vitamin K PO. CHADsVASC at least 5. Warfarin restarted when INR down to 2.15, unable to get INR 3/8. Hgb 10.8 stable, plts wnl. No bleeding noted.  INR remains supratherapeutic (4.09) after holding dose last night.  Goal of Therapy:  INR 2-3 Monitor platelets by anticoagulation protocol: Yes   Plan:  Hold Coumadin tonight. Monitor daily INR, CBC, s/s of bleed   Cornell Gaber A Daijanae Rafalski 11/23/2017,8:19 AM

## 2017-11-24 LAB — GLUCOSE, CAPILLARY: Glucose-Capillary: 106 mg/dL — ABNORMAL HIGH (ref 65–99)

## 2017-11-24 MED ORDER — HYDROCODONE-ACETAMINOPHEN 5-325 MG PO TABS: 1.0000 | ORAL_TABLET | ORAL | 0 refills | Status: AC | PRN

## 2017-11-24 NOTE — Progress Notes (Signed)
Patient has discharge to home on home hospice. All discharge paperwork given to spouse. [facility] to see patient at home this afternoon. Family aware IV removed and all belongings with family. Patient left via [facility].   Mickel Baas RN

## 2017-11-24 NOTE — Progress Notes (Addendum)
Patient medically stable with discharge orders home today.  Call placed to [facility] for Discharge transport for patient.  Requested pick-up ASAP.  Notified staff RN: Maudie Mercury of pick-up in route and provided transport documentation.  [facility] notified of patient being discharge today at [place], BSN, [place]

## 2017-11-24 NOTE — Discharge Summary (Signed)
Physician Discharge Summary  Patient ID: James David MRN: [id number] DOB/AGE: [birth year] 82 y.o.  Admit date: 11/17/2017 Discharge date: 11/24/2017  Admission Diagnoses:  Discharge Diagnoses:  Principal Problem:   Myasthenia gravis in crisis [facility]) Active Problems:   Diabetes mellitus due to underlying condition with diabetic nephropathy ([place])   Chronic diastolic heart failure (H[facility])   ESRD (end stage renal disease) on dialysis ([facility])   Atrial fibrillation, permanent ([place])   Dyslipidemia   Dysphagia   Pressure injury of skin   Weakness   Generalized weakness   Myasthenia exacerbation ([place])   Discharged Condition: good  Hospital Course: Patient is a 82 year old male with ESRD on HD, MWF, atrial fibrillation on Coumadin, history of myasthenia gravis on prednisone, CAD, history of CVA, second-degree heart block with pacemaker, diabetes mellitus presented to ED with worsening weakness. Patient reported that he has noticed worsening weakness in the last 3 weeks. He was just discharged home on 2/27, was treated for progressive weakness, cellulitis of his right foot on doxycycline and IV Fortaz with hemodialysis for 6 weeks. Patient was seen by vascular surgery who recommended bilateral AKA for bilateral heel ulcers, left great toe, right anterior foot and outer ankle wounds, nonhealing however patient refused. He was seen by psychiatry and patient was deemed as unable to have capacity to make decisions. Patient's spouse understood overall poor prognosis however did not wish to pursue medication either. At baseline, patient reports that he is unable to ambulate and uses wheelchair. Today he was found to have increased generalized weakness by his home health nurse and he was unable to transfer to his wheelchair. Patient also reported that he had difficulty keeping his head up due to weakness and did not go for his regular HD today. Patient reported a week of cough and congestion but no fevers  chills, no significant sputum production. No nausea, vomiting, diarrhea.  ED work-up/course:  BP 120/50 , temp 97.8, respiratory rate 18, heart rate 88 Sodium 131, potassium 4.4, BUN 83, creatinine 8.5, hemoglobin 10.5, hematocrit 31.0 Chest x-ray showed small left pleural effusion, no focal consolidation Patient was seen by neurology who recommended IVIG for myasthenia gravis crisis. Nephrology also consulted.   Problems: 1)  Myasthenia gravis - seen by neurology and rec'd 5 day course of IVIG 2)  ESRD - had usual HD in hospital 3)  Foot ulcers/ osteomyelitis - no surg, [facility] 4)  FTT - pt placed onto hospice and hemodialysis after EOL discussions w/ pt , family and medical team.  Pt is for dc home with family and home hospice has seen pt in the hospital and will f/u in OP setting.     Discharge Exam: Blood pressure (!) 149/63, pulse 93, temperature 98.1 F (36.7 C), resp. rate 14, height 6' (1.829 m), weight 72 kg (158 lb 11.7 oz), SpO2 100 %.  Blood pressure (!) 119/51, pulse (!) 121, temperature 97.8 F (36.6 C), temperature source Oral, resp. rate 15, SpO2 100 %. General: Alert, awake, oriented x2, in no acute distress. Eyes: pink conjunctiva,anicteric sclera, pupils equal and reactive to light and accomodation, HEENT: normocephalic, atraumatic, oropharynx clear Neck: supple, no masses or lymphadenopathy, no goiter, no bruits, no JVD CVS: Regular rate and rhythm, without murmurs, rubs or gallops. No lower extremity edema Resp : Clear to auscultation bilaterally, no wheezing, rales or rhonchi. GI : Soft, nontender, nondistended, positive bowel sounds, no masses. No hepatomegaly. No hernia.  Musculoskeletal: No clubbing or cyanosis. Muscle atrophy b/l LE  Neuro: alert  and oriented, right upper extremity 4/5, right lower extremity 3/5, able to lift up, left upper extremity 5/5, left lower extremity 3/5. Weakness in the lower extremity, left-sided facial droop, no dysarthria Psych:  alert and oriented x 2, normal mood and affect Skin: Abrasion on the right arm near elbow, bleeding. Right and left heel ulcers with eschar, left great toe gangrene, abrasion on the right foot    Disposition: 06-Home-Health Care Svc  Discharge Instructions    Discharge patient   Complete by:  As directed    With hospice   Discharge disposition:  01-Home or Self Care   Discharge patient date:  11/24/2017     Allergies as of 11/24/2017      Reactions   Amlodipine Besylate Swelling   Angioedema   Lisinopril Other (See Comments)   Renal insufficiency   Statins Other (See Comments)   Muscle aches   Actos [pioglitazone]    Chest pain   Ropinirole Other (See Comments)   Causes excessive grogginess the following morning after taking   Tape Other (See Comments)   SKIN IS THIN AND TAPE WILL ACTUALLY TEAR IT; PLEASE USE COBAN WRAP INSTEAD!!      Medication List    STOP taking these medications   aspirin EC 81 MG tablet   calcium acetate 667 MG capsule Commonly known as:  PHOSLO   carvedilol 25 MG tablet Commonly known as:  COREG   cefTAZidime 2 g in sodium chloride 0.9 % 100 mL   collagenase ointment Commonly known as:  SANTYL   doxycycline 100 MG tablet Commonly known as:  VIBRA-TABS   ENSURE   ezetimibe 10 MG tablet Commonly known as:  ZETIA   feeding supplement (NEPRO CARB STEADY) Liqd   fenofibrate 160 MG tablet   finasteride 5 MG tablet Commonly known as:  PROSCAR   folic acid-vitamin b complex-vitamin c-selenium-zinc 3 MG Tabs tablet   lanthanum 1000 MG chewable tablet Commonly known as:  FOSRENOL   Melatonin 3 MG Tabs   povidone-iodine 10 % ointment Commonly known as:  BETADINE   warfarin 1 MG tablet Commonly known as:  COUMADIN     TAKE these medications   acetaminophen 500 MG tablet Commonly known as:  TYLENOL Take 500-1,000 mg by mouth daily as needed for moderate pain or headache.   ARTIFICIAL TEAR OP Place 1 drop into both eyes daily.    glimepiride 1 MG tablet Commonly known as:  AMARYL Take 1 tablet (1 mg total) by mouth daily with breakfast. What changed:    how much to take  additional instructions   HYDROcodone-acetaminophen 5-325 MG tablet Commonly known as:  NORCO/VICODIN Take 1-2 tablets by mouth every 4 (four) hours as needed for up to 10 days for moderate pain.   predniSONE 20 MG tablet Commonly known as:  DELTASONE Take 1 tablet (20 mg total) by mouth daily with breakfast. Please note to continue taking 20 mg tablet for now  and you must be evaluated by your neurologist (Dr. [name]) to determine next Prednisone dose and frequency. What changed:  additional instructions   traMADol 50 MG tablet Commonly known as:  ULTRAM Take 50-100 mg by mouth 2 (two) times daily as needed for moderate pain.      Follow-up Information    [name], MD. Call.   Specialty:  Family Medicine Contact information: [place] [address] [phone]           Signed: Sol Blazing 11/24/2017,  8:09 AM

## 2017-11-26 ENCOUNTER — Telehealth: Payer: Self-pay | Admitting: Neurology

## 2017-11-26 NOTE — Telephone Encounter (Signed)
James David/[facility] [phone] will be faxing form to [phone] reg pt starting in home infusion with gamunex. He asking RN to please call him.

## 2017-11-26 NOTE — Telephone Encounter (Signed)
Spoke with Larkin Ina.  New IVIG orders received, signed, faxed back to [facility],fax# [phone]

## 2017-12-15 DEATH — deceased

## 2018-01-20 ENCOUNTER — Ambulatory Visit: Payer: Medicare Other | Admitting: Neurology

## 2018-01-22 ENCOUNTER — Telehealth: Payer: Self-pay | Admitting: Hematology

## 2018-01-22 NOTE — Telephone Encounter (Signed)
Printed ROI for [employer] on 01/22/18, Release ID [id number]

## 2019-04-06 IMAGING — CT CT HEAD W/O CM
4 series · 17 of 47 positions shown, 19 images · non-contrast
Comparison: CT head without contrast 01/30/2017.

CLINICAL DATA: Syncopal episode.  Hypotension.

EXAM:
CT HEAD WITHOUT CONTRAST
TECHNIQUE: Contiguous axial images were obtained from the base of the skull
through the vertex without intravenous contrast.

[Series 3: head bone · axial · 0.44mm/px · z∈[-145,-87]mm · 4 of 84 slices shown]
[im 9/84  bone]
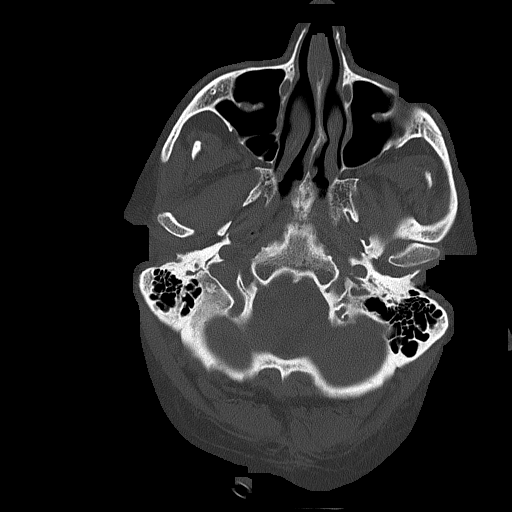
[im 17/84  bone]
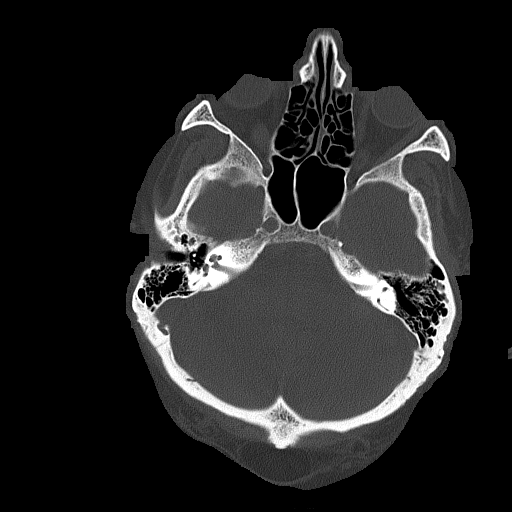
[im 25/84  bone]
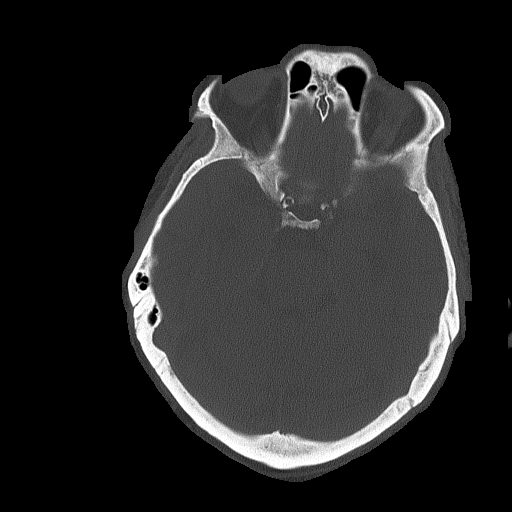
[im 38/84  bone]
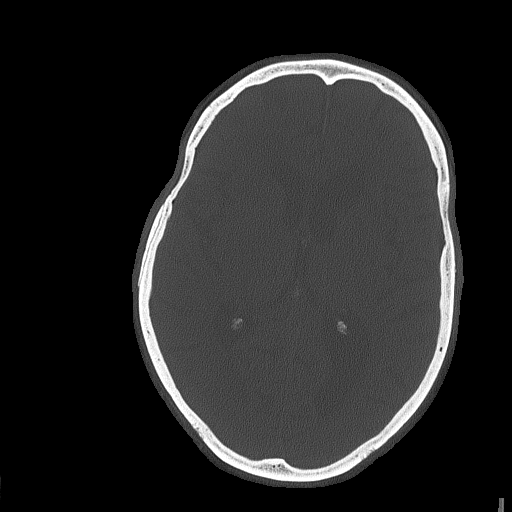

[Series 4: head without · axial · non-contrast · 0.44mm/px · z∈[-141,-21]mm · 7 of 34 slices shown, 9 images]
[im 5/34  brain]
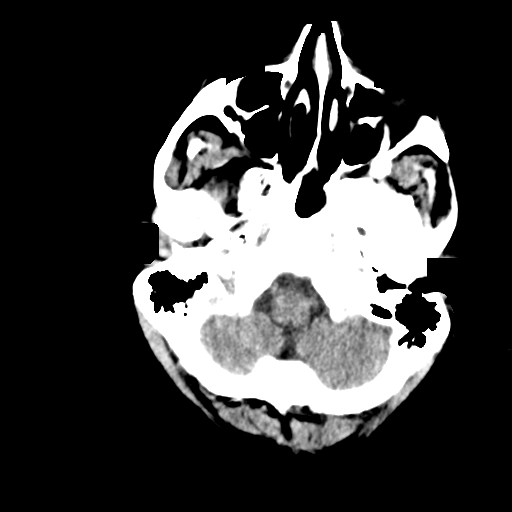
[im 5/34  bone]
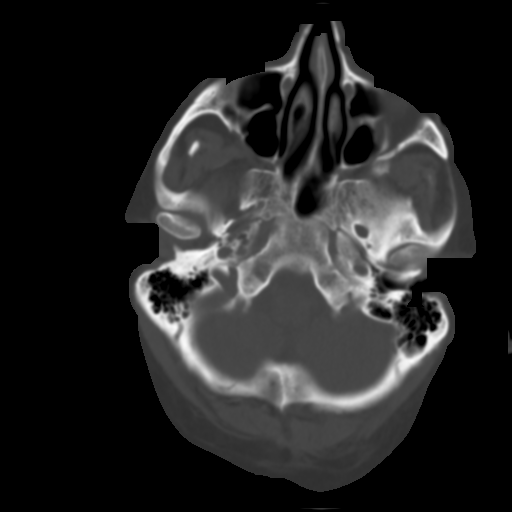
[im 9/34  brain]
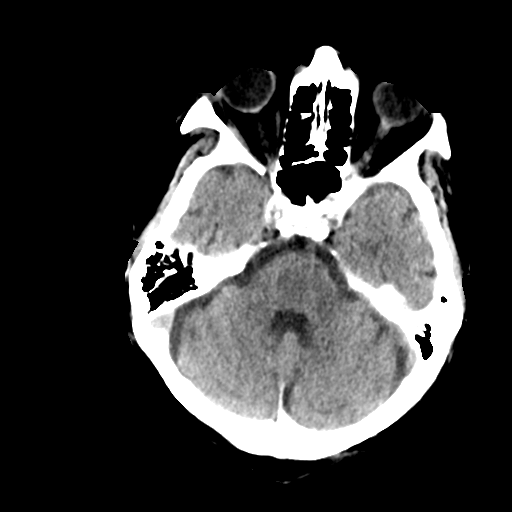
[im 13/34  brain]
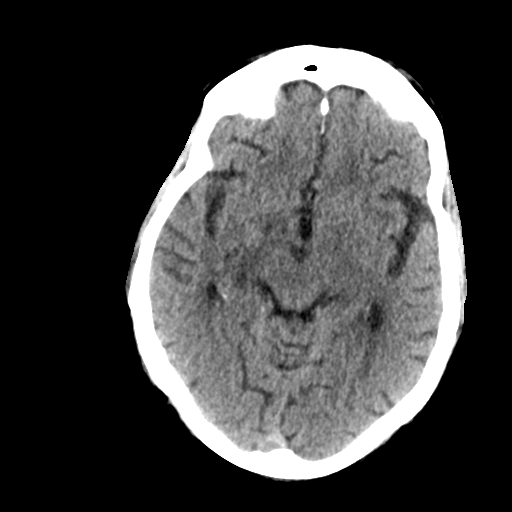
[im 17/34  brain]
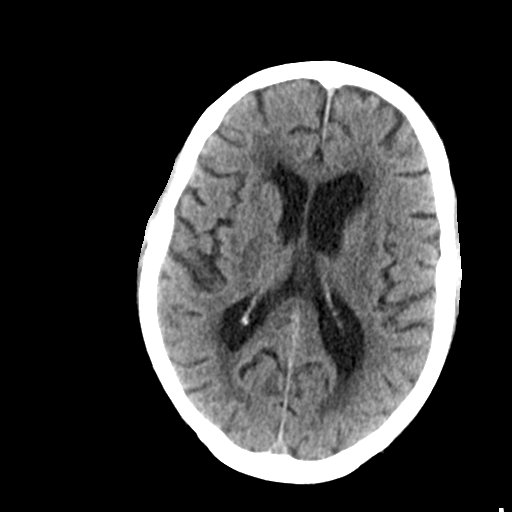
[im 21/34  brain]
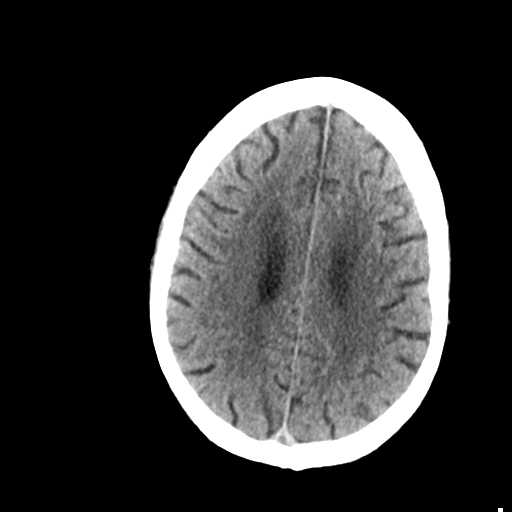
[im 21/34  bone]
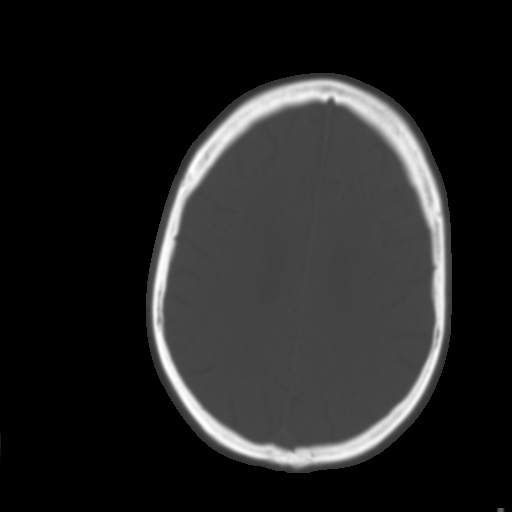
[im 25/34  brain]
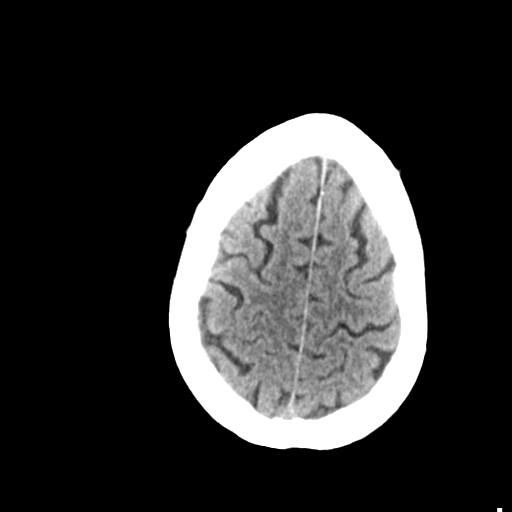
[im 29/34  brain]
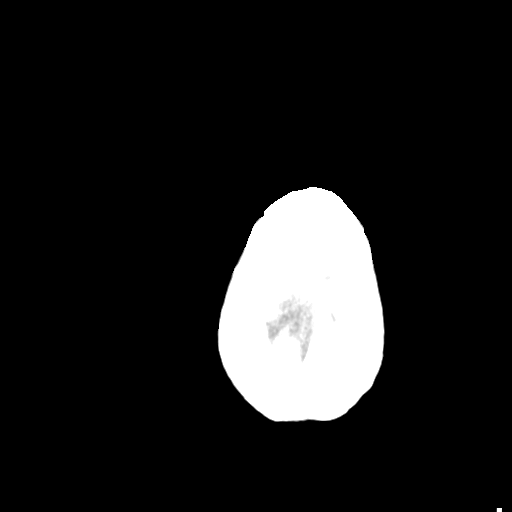

[Series 5: head without cor · coronal · non-contrast · 0.32mm/px · 3 of 67 slices shown]
[im 23/67  brain]
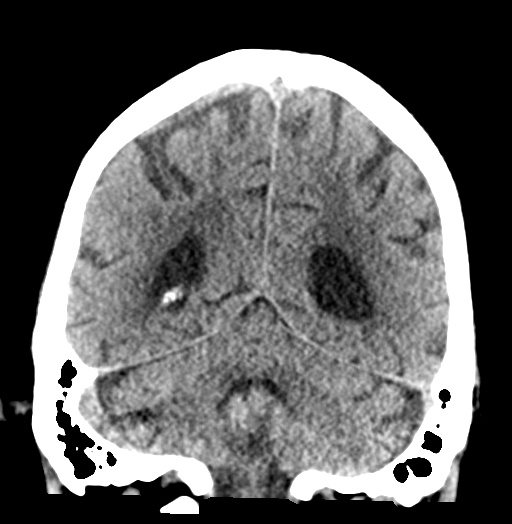
[im 30/67  brain]
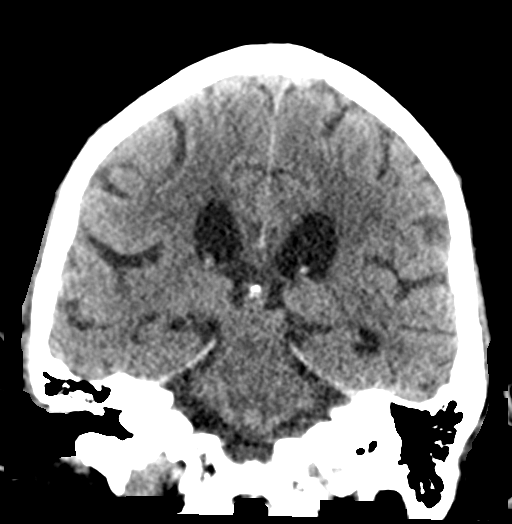
[im 37/67  brain]
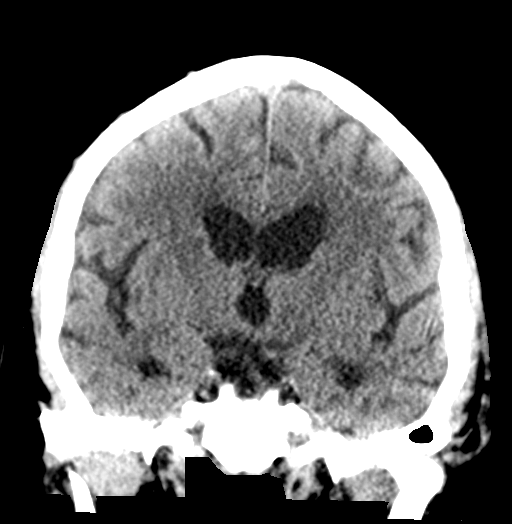

[Series 6: head without sag · sagittal · non-contrast · 0.33mm/px · 3 of 66 slices shown]
[im 22/66  brain]
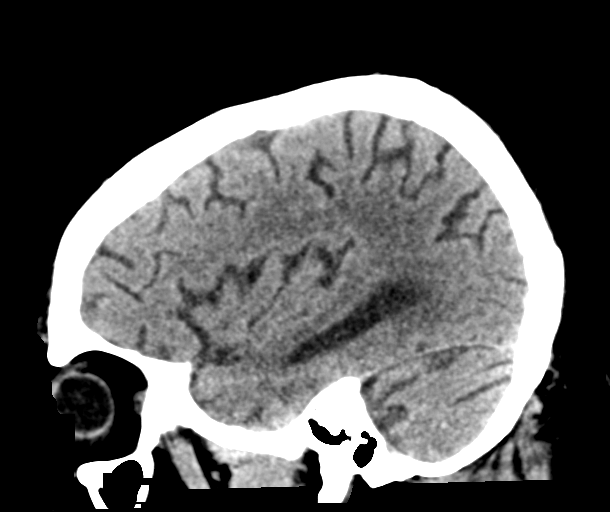
[im 33/66  brain]
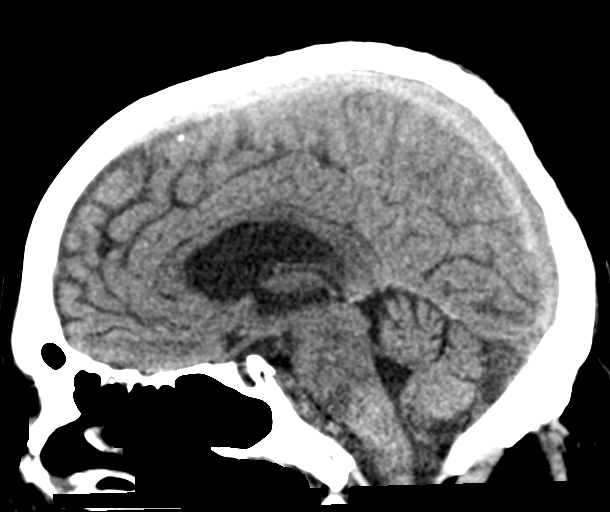
[im 44/66  brain]
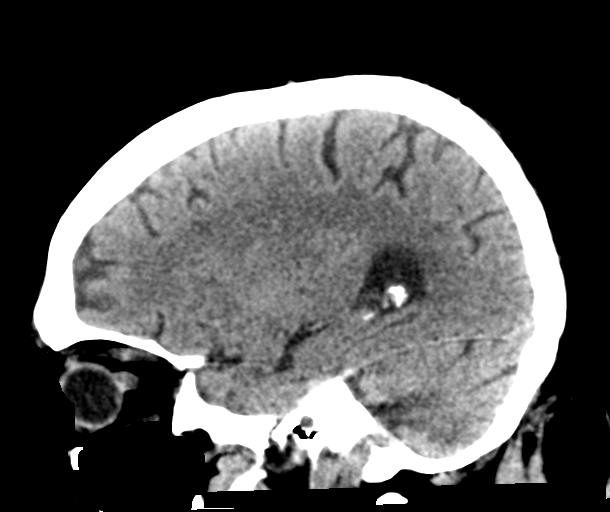

[17 of 47 positions shown; findings below may reference images not displayed]

FINDINGS: Brain: Mild generalized atrophy and moderate diffuse white matter
disease is similar the prior exam. Remote lacunar infarcts involving
the left caudate head are again noted. The basal ganglia are
otherwise intact. No acute infarct, hemorrhage, or mass lesion
present. The insular ribbon is normal bilaterally. The brainstem and
cerebellum are within normal limits. Ventricles are proportionate to
the degree of fat. No significant extra-axial fluid collection is
present.

Vascular: Atherosclerotic calcifications are present in the
cavernous internal carotid arteries bilaterally.

Skull: Calvarium is intact. No focal lytic or blastic lesions are
present.

Sinuses/Orbits: The paranasal sinuses mastoid air cells are clear.
The globes and orbits are within normal limits.
IMPRESSION: 1. No acute intracranial abnormality or significant interval change.
2. Stable atrophy and moderate diffuse white matter disease. This
likely reflects the sequela of chronic microvascular ischemia.
